# Patient Record
Sex: Male | Born: 1935 | Race: White | Hispanic: No | Marital: Married | Smoking: Never smoker
Health system: Southern US, Community
[De-identification: ages and names within clinical notes are randomized; demographics above are authoritative.]

## PROBLEM LIST (undated history)

## (undated) DIAGNOSIS — I255 Ischemic cardiomyopathy: Secondary | ICD-10-CM

## (undated) DIAGNOSIS — N289 Disorder of kidney and ureter, unspecified: Secondary | ICD-10-CM

## (undated) DIAGNOSIS — I4891 Unspecified atrial fibrillation: Secondary | ICD-10-CM

## (undated) DIAGNOSIS — Z888 Allergy status to other drugs, medicaments and biological substances status: Secondary | ICD-10-CM

## (undated) DIAGNOSIS — I509 Heart failure, unspecified: Secondary | ICD-10-CM

## (undated) DIAGNOSIS — E785 Hyperlipidemia, unspecified: Secondary | ICD-10-CM

## (undated) DIAGNOSIS — I779 Disorder of arteries and arterioles, unspecified: Secondary | ICD-10-CM

## (undated) DIAGNOSIS — N189 Chronic kidney disease, unspecified: Secondary | ICD-10-CM

## (undated) DIAGNOSIS — I10 Essential (primary) hypertension: Secondary | ICD-10-CM

## (undated) DIAGNOSIS — Z95810 Presence of automatic (implantable) cardiac defibrillator: Secondary | ICD-10-CM

## (undated) DIAGNOSIS — I739 Peripheral vascular disease, unspecified: Secondary | ICD-10-CM

## (undated) DIAGNOSIS — I340 Nonrheumatic mitral (valve) insufficiency: Secondary | ICD-10-CM

## (undated) DIAGNOSIS — I219 Acute myocardial infarction, unspecified: Secondary | ICD-10-CM

## (undated) DIAGNOSIS — M1990 Unspecified osteoarthritis, unspecified site: Secondary | ICD-10-CM

## (undated) DIAGNOSIS — I2510 Atherosclerotic heart disease of native coronary artery without angina pectoris: Secondary | ICD-10-CM

## (undated) DIAGNOSIS — K219 Gastro-esophageal reflux disease without esophagitis: Secondary | ICD-10-CM

## (undated) HISTORY — DX: Disorder of kidney and ureter, unspecified: N28.9

## (undated) HISTORY — PX: KNEE ARTHROSCOPY: SHX127

## (undated) HISTORY — DX: Unspecified atrial fibrillation: I48.91

## (undated) HISTORY — DX: Hyperlipidemia, unspecified: E78.5

## (undated) HISTORY — PX: NASAL SINUS SURGERY: SHX719

## (undated) HISTORY — DX: Allergy status to other drugs, medicaments and biological substances: Z88.8

## (undated) HISTORY — DX: Chronic kidney disease, unspecified: N18.9

## (undated) HISTORY — DX: Ischemic cardiomyopathy: I25.5

## (undated) HISTORY — DX: Disorder of arteries and arterioles, unspecified: I77.9

## (undated) HISTORY — DX: Peripheral vascular disease, unspecified: I73.9

---

## 2006-07-31 ENCOUNTER — Encounter: Admission: RE | Admit: 2006-07-31 | Discharge: 2006-08-07 | Payer: Self-pay | Admitting: Orthopaedic Surgery

## 2011-10-08 DIAGNOSIS — I5023 Acute on chronic systolic (congestive) heart failure: Secondary | ICD-10-CM

## 2011-10-08 DIAGNOSIS — I200 Unstable angina: Secondary | ICD-10-CM

## 2011-10-09 DIAGNOSIS — I509 Heart failure, unspecified: Secondary | ICD-10-CM

## 2011-10-10 ENCOUNTER — Encounter (HOSPITAL_COMMUNITY): Payer: Self-pay | Admitting: General Practice

## 2011-10-10 ENCOUNTER — Inpatient Hospital Stay (HOSPITAL_COMMUNITY)
Admission: AD | Admit: 2011-10-10 | Discharge: 2011-10-24 | DRG: 216 | Disposition: A | Discharge status: Home / Self Care | Payer: Medicare Other | Source: Other Acute Inpatient Hospital | Attending: Thoracic Surgery (Cardiothoracic Vascular Surgery) | Admitting: Thoracic Surgery (Cardiothoracic Vascular Surgery)

## 2011-10-10 DIAGNOSIS — D62 Acute posthemorrhagic anemia: Secondary | ICD-10-CM | POA: Diagnosis not present

## 2011-10-10 DIAGNOSIS — Z7901 Long term (current) use of anticoagulants: Secondary | ICD-10-CM

## 2011-10-10 DIAGNOSIS — Z7982 Long term (current) use of aspirin: Secondary | ICD-10-CM

## 2011-10-10 DIAGNOSIS — I214 Non-ST elevation (NSTEMI) myocardial infarction: Principal | ICD-10-CM | POA: Diagnosis present

## 2011-10-10 DIAGNOSIS — I509 Heart failure, unspecified: Secondary | ICD-10-CM | POA: Diagnosis present

## 2011-10-10 DIAGNOSIS — I059 Rheumatic mitral valve disease, unspecified: Secondary | ICD-10-CM | POA: Diagnosis present

## 2011-10-10 DIAGNOSIS — I4891 Unspecified atrial fibrillation: Secondary | ICD-10-CM | POA: Diagnosis not present

## 2011-10-10 DIAGNOSIS — I519 Heart disease, unspecified: Secondary | ICD-10-CM | POA: Diagnosis not present

## 2011-10-10 DIAGNOSIS — N179 Acute kidney failure, unspecified: Secondary | ICD-10-CM | POA: Diagnosis not present

## 2011-10-10 DIAGNOSIS — Z79899 Other long term (current) drug therapy: Secondary | ICD-10-CM

## 2011-10-10 DIAGNOSIS — I5021 Acute systolic (congestive) heart failure: Secondary | ICD-10-CM

## 2011-10-10 DIAGNOSIS — K219 Gastro-esophageal reflux disease without esophagitis: Secondary | ICD-10-CM | POA: Diagnosis present

## 2011-10-10 DIAGNOSIS — I6529 Occlusion and stenosis of unspecified carotid artery: Secondary | ICD-10-CM | POA: Diagnosis present

## 2011-10-10 DIAGNOSIS — Z9889 Other specified postprocedural states: Secondary | ICD-10-CM

## 2011-10-10 DIAGNOSIS — Y832 Surgical operation with anastomosis, bypass or graft as the cause of abnormal reaction of the patient, or of later complication, without mention of misadventure at the time of the procedure: Secondary | ICD-10-CM | POA: Diagnosis not present

## 2011-10-10 DIAGNOSIS — I340 Nonrheumatic mitral (valve) insufficiency: Secondary | ICD-10-CM | POA: Diagnosis present

## 2011-10-10 DIAGNOSIS — Y9210 Unspecified residential institution as the place of occurrence of the external cause: Secondary | ICD-10-CM | POA: Diagnosis not present

## 2011-10-10 DIAGNOSIS — N183 Chronic kidney disease, stage 3 unspecified: Secondary | ICD-10-CM | POA: Diagnosis present

## 2011-10-10 DIAGNOSIS — I2510 Atherosclerotic heart disease of native coronary artery without angina pectoris: Secondary | ICD-10-CM | POA: Diagnosis present

## 2011-10-10 DIAGNOSIS — E119 Type 2 diabetes mellitus without complications: Secondary | ICD-10-CM | POA: Diagnosis present

## 2011-10-10 DIAGNOSIS — I129 Hypertensive chronic kidney disease with stage 1 through stage 4 chronic kidney disease, or unspecified chronic kidney disease: Secondary | ICD-10-CM | POA: Diagnosis present

## 2011-10-10 DIAGNOSIS — Z951 Presence of aortocoronary bypass graft: Secondary | ICD-10-CM

## 2011-10-10 DIAGNOSIS — I2589 Other forms of chronic ischemic heart disease: Secondary | ICD-10-CM | POA: Diagnosis present

## 2011-10-10 DIAGNOSIS — E7800 Pure hypercholesterolemia, unspecified: Secondary | ICD-10-CM | POA: Diagnosis present

## 2011-10-10 DIAGNOSIS — E785 Hyperlipidemia, unspecified: Secondary | ICD-10-CM | POA: Diagnosis present

## 2011-10-10 DIAGNOSIS — D696 Thrombocytopenia, unspecified: Secondary | ICD-10-CM | POA: Diagnosis not present

## 2011-10-10 DIAGNOSIS — I5023 Acute on chronic systolic (congestive) heart failure: Secondary | ICD-10-CM | POA: Diagnosis present

## 2011-10-10 HISTORY — DX: Essential (primary) hypertension: I10

## 2011-10-10 HISTORY — DX: Nonrheumatic mitral (valve) insufficiency: I34.0

## 2011-10-10 HISTORY — DX: Unspecified osteoarthritis, unspecified site: M19.90

## 2011-10-10 HISTORY — DX: Gastro-esophageal reflux disease without esophagitis: K21.9

## 2011-10-10 LAB — CBC
HCT: 44 % (ref 39.0–52.0)
Hemoglobin: 15.5 g/dL (ref 13.0–17.0)
MCH: 31.1 pg (ref 26.0–34.0)
MCHC: 35.2 g/dL (ref 30.0–36.0)
MCV: 88.2 fL (ref 78.0–100.0)

## 2011-10-10 MED ORDER — SODIUM CHLORIDE 0.9 % IV SOLN: 250.0000 mL | INTRAVENOUS | Status: DC | PRN

## 2011-10-10 MED ORDER — SODIUM CHLORIDE 0.9 % IJ SOLN: 3.0000 mL | INTRAMUSCULAR | Status: DC | PRN

## 2011-10-10 MED ORDER — HEPARIN SOD (PORCINE) IN D5W 100 UNIT/ML IV SOLN
1850.0000 [IU]/h | INTRAVENOUS | Status: DC
  Administered 2011-10-10: 1450 [IU]/h via INTRAVENOUS
  Administered 2011-10-11 (×3): 1850 [IU]/h via INTRAVENOUS
  Filled 2011-10-10 (×5): qty 250

## 2011-10-10 MED ORDER — ROSUVASTATIN CALCIUM 40 MG PO TABS
40.0000 mg | ORAL_TABLET | Freq: Every day | ORAL | Status: DC
  Administered 2011-10-10 – 2011-10-16 (×7): 40 mg via ORAL
  Filled 2011-10-10 (×9): qty 1

## 2011-10-10 MED ORDER — ONDANSETRON HCL 4 MG/2ML IJ SOLN: 4.0000 mg | Freq: Four times a day (QID) | INTRAMUSCULAR | Status: DC | PRN

## 2011-10-10 MED ORDER — INSULIN GLARGINE 100 UNIT/ML ~~LOC~~ SOLN
15.0000 [IU] | Freq: Every day | SUBCUTANEOUS | Status: DC
Start: 2011-10-10 — End: 2011-10-17
  Administered 2011-10-10 – 2011-10-16 (×7): 15 [IU] via SUBCUTANEOUS
  Filled 2011-10-10 (×2): qty 3

## 2011-10-10 MED ORDER — ASPIRIN EC 81 MG PO TBEC
81.0000 mg | DELAYED_RELEASE_TABLET | Freq: Every day | ORAL | Status: DC
  Administered 2011-10-11: 81 mg via ORAL
  Filled 2011-10-10: qty 1

## 2011-10-10 MED ORDER — CARVEDILOL 3.125 MG PO TABS
3.1250 mg | ORAL_TABLET | Freq: Two times a day (BID) | ORAL | Status: DC
  Administered 2011-10-10 – 2011-10-17 (×14): 3.125 mg via ORAL
  Filled 2011-10-10 (×17): qty 1

## 2011-10-10 MED ORDER — HEPARIN BOLUS VIA INFUSION
4000.0000 [IU] | Freq: Once | INTRAVENOUS | Status: AC
  Administered 2011-10-10: 4000 [IU] via INTRAVENOUS
  Filled 2011-10-10: qty 4000

## 2011-10-10 MED ORDER — LOSARTAN POTASSIUM 50 MG PO TABS
100.0000 mg | ORAL_TABLET | Freq: Every day | ORAL | Status: DC
  Administered 2011-10-11 – 2011-10-15 (×5): 100 mg via ORAL
  Filled 2011-10-10 (×6): qty 2

## 2011-10-10 MED ORDER — SODIUM CHLORIDE 0.9 % IJ SOLN
3.0000 mL | Freq: Two times a day (BID) | INTRAMUSCULAR | Status: DC
  Administered 2011-10-11 – 2011-10-16 (×9): 3 mL via INTRAVENOUS

## 2011-10-10 MED ORDER — ACETAMINOPHEN 325 MG PO TABS: 650.0000 mg | ORAL_TABLET | ORAL | Status: DC | PRN

## 2011-10-10 NOTE — Progress Notes (Signed)
Wayne Gordon is a 78 y.o. with hypertension, diabetes, and dyslipidemia admitted for progressive dyspnea, orthopnea and PND over the last week to [facility].  He was found to have newly diagnosed systolic heart failure, EF 25-30% with global hypokinesis of the LV with minor regional variations, moderately to severely dilated LA and mildly reduced RV systolic function.  Moderate to severe mitral regurgitation.  The patient has responded well to IV diuresis with symptomatic improvement.  He has been transferred for evaluation with a R and L heart cath on Wednesday by Dr. Kirke Corin.    Problems: 1. Acute systolic heart failure, EF 25-30% 2. NSTEMI, Type II     - peak troponin 0.07 with nl MBs 3. RI 4. DM 5. ACE-I allergy 6. HTN 7. HL  Plan: Continue current medical management until further evaluation with cardiac cath.  All questions concerning cath were answered for the patient and his wife.  Check Bmet.     Robbi Garter, PA-C 7:20 PM

## 2011-10-10 NOTE — Progress Notes (Signed)
1810 transferred from  [facility] via [facility]. No apparent distress

## 2011-10-10 NOTE — Progress Notes (Signed)
ANTICOAGULATION CONSULT NOTE - Initial Consult  Pharmacy Consult for Heparin Indication: chest pain/ACS  Allergies  Allergen Reactions  . Penicillins Palpitations    Patient Measurements: Height: 6\' 5"  (195.6 cm) Weight: 232 lb 5.8 oz (105.4 kg) ( scale A) IBW/kg (Calculated) : 89.1  Heparin Dosing Weight: 105kg  Vital Signs: Temp: 98 F (36.7 C) (02/11 1758) Temp src: Oral (02/11 1758) BP: 140/77 mmHg (02/11 1758) Pulse Rate: 93  (02/11 1758)  Labs: No results found for this basename: HGB:2,HCT:3,PLT:3,APTT:3,LABPROT:3,INR:3,HEPARINUNFRC:3,CREATININE:3,CKTOTAL:3,CKMB:3,TROPONINI:3 in the last 72 hours CrCl is unknown because no creatinine reading has been taken.  Medical History: No past medical history on file.  Medications:  Prescriptions prior to admission  Medication Sig Dispense Refill  . aspirin 81 MG chewable tablet Chew 81 mg by mouth daily.      [name] Kitchen CALCIUM-MAGNESIUM-ZINC PO Take 2 tablets by mouth daily.      . hydrochlorothiazide (HYDRODIURIL) 25 MG tablet Take 25 mg by mouth daily.      [name] Kitchen losartan (COZAAR) 100 MG tablet Take 100 mg by mouth daily.      . metFORMIN (GLUCOPHAGE) 850 MG tablet Take 850 mg by mouth 2 (two) times daily with a meal.      . Multiple Vitamins-Minerals (MULTIVITAMINS THER. W/MINERALS) TABS Take 1 tablet by mouth daily.      . niacin (NIASPAN) 1000 MG CR tablet Take 1,000 mg by mouth at bedtime.      [name] Kitchen omega-3 acid ethyl esters (LOVAZA) 1 G capsule Take 2 g by mouth daily.      [name] Kitchen omeprazole (PRILOSEC) 20 MG capsule Take 20 mg by mouth daily.      . rosuvastatin (CRESTOR) 20 MG tablet Take 20 mg by mouth every evening.      . testosterone cypionate (DEPOTESTOTERONE CYPIONATE) 200 MG/ML injection Inject 200 mg into the muscle every 14 (fourteen) days.      . vitamin E (VITAMIN E) 400 UNIT capsule Take 400 Units by mouth daily.       Scheduled:    . aspirin EC  81 mg Oral Daily  . carvedilol  3.125 mg Oral BID WC  . heparin  4,000  Units Intravenous Once  . insulin glargine  15 Units Subcutaneous QHS  . losartan  100 mg Oral Daily  . rosuvastatin  40 mg Oral q1800  . sodium chloride  3 mL Intravenous Q12H    Assessment: 77 YOM pending cardiac cath for Wednesday,  with NSTEMI per PA's note. Pharmacy is consulted to start heparin infusion for ACS. Patient was not on anticoagulants prior to admission. No baseline cbc, PT/INR or aPTT   Goal of Therapy:  Heparin level 0.3-0.7 units/ml   Plan:  1. Start Heparin Bolus 4000 units 2. Heparin infusion 1450 units/hr 3. Baseline PT/INR, CBC and aPTT 4. Daily CBC and heparin level with am Labs 5. F/u heparin level in Am    Alvester Morin, Albertha Ghee 10/10/2011,8:01 PM

## 2011-10-11 DIAGNOSIS — I214 Non-ST elevation (NSTEMI) myocardial infarction: Principal | ICD-10-CM

## 2011-10-11 LAB — BASIC METABOLIC PANEL
BUN: 26 mg/dL — ABNORMAL HIGH (ref 6–23)
Creatinine, Ser: 1.56 mg/dL — ABNORMAL HIGH (ref 0.50–1.35)
GFR calc Af Amer: 48 mL/min — ABNORMAL LOW (ref >90)
GFR calc non Af Amer: 42 mL/min — ABNORMAL LOW (ref >90)
Potassium: 4.2 mEq/L (ref 3.5–5.1)

## 2011-10-11 LAB — CBC
MCH: 30.5 pg (ref 26.0–34.0)
MCHC: 34.3 g/dL (ref 30.0–36.0)
MCV: 88.8 fL (ref 78.0–100.0)
Platelets: 232 10*3/uL (ref 150–400)
RDW: 12.8 % (ref 11.5–15.5)

## 2011-10-11 LAB — GLUCOSE, CAPILLARY
Glucose-Capillary: 134 mg/dL — ABNORMAL HIGH (ref 70–99)
Glucose-Capillary: 125 mg/dL — ABNORMAL HIGH (ref 70–99)
Glucose-Capillary: 101 mg/dL — ABNORMAL HIGH (ref 70–99)

## 2011-10-11 MED ORDER — HEPARIN BOLUS VIA INFUSION
3000.0000 [IU] | Freq: Once | INTRAVENOUS | Status: AC
  Administered 2011-10-11: 3000 [IU] via INTRAVENOUS
  Filled 2011-10-11: qty 3000

## 2011-10-11 MED ORDER — SODIUM CHLORIDE 0.9 % IJ SOLN: 3.0000 mL | INTRAMUSCULAR | Status: DC | PRN

## 2011-10-11 MED ORDER — SODIUM CHLORIDE 0.9 % IJ SOLN
3.0000 mL | Freq: Two times a day (BID) | INTRAMUSCULAR | Status: DC
  Administered 2011-10-11: 3 mL via INTRAVENOUS

## 2011-10-11 MED ORDER — ASPIRIN 81 MG PO CHEW
324.0000 mg | CHEWABLE_TABLET | ORAL | Status: AC
  Administered 2011-10-12: 324 mg via ORAL
  Filled 2011-10-11: qty 4

## 2011-10-11 MED ORDER — DIAZEPAM 5 MG PO TABS
5.0000 mg | ORAL_TABLET | ORAL | Status: AC
  Administered 2011-10-12: 5 mg via ORAL
  Filled 2011-10-11: qty 1

## 2011-10-11 MED ORDER — SODIUM CHLORIDE 0.9 % IV SOLN
1.0000 mL/kg/h | INTRAVENOUS | Status: DC
  Administered 2011-10-12: 1 mL/kg/h via INTRAVENOUS

## 2011-10-11 MED ORDER — SODIUM CHLORIDE 0.9 % IV SOLN: 250.0000 mL | INTRAVENOUS | Status: DC | PRN

## 2011-10-11 MED ORDER — ASPIRIN EC 81 MG PO TBEC
81.0000 mg | DELAYED_RELEASE_TABLET | Freq: Every day | ORAL | Status: DC
  Administered 2011-10-13 – 2011-10-16 (×4): 81 mg via ORAL
  Filled 2011-10-11 (×5): qty 1

## 2011-10-11 MED FILL — Heparin Sodium (Porcine) 100 Unt/ML in Sodium Chloride 0.45%: INTRAMUSCULAR | Qty: 250 | Status: AC

## 2011-10-11 NOTE — Progress Notes (Signed)
10/11/11 1430 UR Completed. Tera Mater, RN, BSN

## 2011-10-11 NOTE — Progress Notes (Signed)
ANTICOAGULATION CONSULT NOTE - Follow Up Consult  Pharmacy Consult for Heparin Indication: ACS  Allergies  Allergen Reactions  . Penicillins Palpitations    Patient Measurements: Height: 6\' 5"  (195.6 cm) Weight: 231 lb 14.8 oz (105.2 kg) IBW/kg (Calculated) : 89.1  Heparin Dosing Weight:  Vital Signs: Temp: 98.8 F (37.1 C) (02/12 1417) Temp src: Oral (02/12 1417) BP: 120/72 mmHg (02/12 1417) Pulse Rate: 81  (02/12 1417)  Labs:  Basename 10/11/11 1948 10/11/11 1354 10/11/11 0608 10/10/11 2056  HGB -- -- 15.0 15.5  HCT -- -- 43.7 44.0  PLT -- -- 232 251  APTT -- -- -- 51*  LABPROT -- -- -- 14.2  INR -- -- -- 1.08  HEPARINUNFRC 0.53 0.65 0.14* --  CREATININE -- -- 1.56* --  CKTOTAL -- -- -- --  CKMB -- -- -- --  TROPONINI -- -- -- --   Estimated Creatinine Clearance: 51.6 ml/min (by C-G formula based on Cr of 1.56).  Assessment: Patient is a 78 y.o M on heparin for ACS with plan for cath on 2/13.  2/12 at ~8pm RN notified Rx of some bleeding around IV site. I instructed RN to hold heparin as heparin level was in process.  Charge RN discovered IV site was bad, IV team came and placed new heplock.  Heparin level returned therapeutc = 0.53. Heparin restarted by RN and told to monitor for bleeding.   Goal of Therapy:  Heparin level 0.3-0.7 units/ml   Plan:  1) Continue with current heparin drip at 1850 units/hr as levels therapeutic x 2.  2) f/u aml and monitor for bleeding 3) Instructed RN to call MD if further bleeding at new IV site as may need to stop heparin   Dannielle Huh 10/11/2011,8:52 PM

## 2011-10-11 NOTE — Progress Notes (Signed)
1515 Wayne Gordon called . Made aware of pt's pauses in cardiac strip . Will put orders in

## 2011-10-11 NOTE — Significant Event (Signed)
Upon arrival to patient's room to obtain shift report with the dayshift nurse [facility] Tamsen Snider , noticed tech, Harvin Hazel NT 1 +3 was assisting patient's with changing the dressing on his new IV site (Right  AC changed today right before 1900 per patient's report) because it was bleeding.Patient is on heparin and nurse informed heparin was increased to 18.7ml/hr, 1850 units/hr, per call from pharmacy dept. After assisting tech to change dressing on right AC, there was still some moderate amount of bleeding that was sipping through dressing site. I called pharmacy dept., spoke to the pharmacist, Synetta Fail and advised him of info. He stated he would inform patient's pharmacist and have him come to patient's room to assess the site. Pharmacist, Juliette Alcide came and he informed me to temporarily hold heparin infusion until IV was assessed/changed and heparin evening level comes back. Lab was in the room around this time. Called IV team to come and assess site. While trying to fix dressing, IV extension came off. Charge nurse then removed patient's IV. IV team staff, Kara Dies came and placed a new IV in (Right posterior forearm). When Zack assessed the second site (IV placed at [facility] on Saturday 10/08/2011 per patient and wife), he concluded that that IV did not need to be changed and went forth to clean the site and changed the dressing. Heparin was turned off for approximately 30 mins. (approximately 8:10pm - 8:40pm). Dustin left for a [employer] on 2500. When he returned, heparin level result was already on the computer. Heparin level was within therapeutic range of 0.53. Heparin was restarted. Amalia Hailey stated to continue to monitor patient and if patient begins to bleed again, then to call provider on-call.

## 2011-10-11 NOTE — Progress Notes (Signed)
15000 pt had 2.9 second pause in cardiac strip . Pt asymptomatic . Placed a call to Dr. Charlestine Massed or coverage . Awaiting  for [employer] .to call back

## 2011-10-11 NOTE — Progress Notes (Signed)
CSW met with patient at the bedside to provide emotional support and CHF management education. CSW also assessed for depressive symptoms and the need for home health. Pt was acknowledged that he was newly diagnosed with CHF and that he was un-aware of all CHF management guidelines. CSW reviewed the CHF packet indepth explaining the HF symptoms, the zone tool and the importance of weighing daily, eating a low sodium diet and adhering to all medications prescribed.Pt currently lives with his wife and is very supported by his wife and his three children. Pt reported having no interest in [facility] at this time.   Pt reported no symptoms of depression at this time. Patient was very appreciative of support and education provided by CSW.Pt was interested in [facility] thus Clinical Social Worker contacted [facility] to see if he might be a possible candidate. Clinical Social Worker will sign off for now as social work intervention is no longer needed. Please consult [place] again if new need arises.

## 2011-10-11 NOTE — Progress Notes (Signed)
   Subjective:  Patient was transferred here from [facility] for systolic CHF and mitral regurgitation.  He is scheduled for right and left heart cath tomorrow with Dr. Kirke Corin.  Has history of  Diabetes and mild renal insufficiency.  Recent Type II NSTEMI.  Objective:  Vital Signs in the last 24 hours: Temp:  [97.8 F (36.6 C)-98 F (36.7 C)] 97.8 F (36.6 C) (02/12 0618) Pulse Rate:  [78-93] 78  (02/12 0618) Resp:  [19-20] 19  (02/12 0618) BP: (114-140)/(65-77) 115/65 mmHg (02/12 0618) SpO2:  [94 %-96 %] 96 % (02/12 0618) Weight:  [231 lb 14.8 oz (105.2 kg)-232 lb 5.8 oz (105.4 kg)] 231 lb 14.8 oz (105.2 kg) (02/12 0618)  Intake/Output from previous day: 02/11 0701 - 02/12 0700 In: 240 [P.O.:240] Out: 800 [Urine:800] Intake/Output from this shift:       . aspirin EC  81 mg Oral Daily  . carvedilol  3.125 mg Oral BID WC  . heparin  3,000 Units Intravenous Once  . heparin  4,000 Units Intravenous Once  . insulin glargine  15 Units Subcutaneous QHS  . losartan  100 mg Oral Daily  . rosuvastatin  40 mg Oral q1800  . sodium chloride  3 mL Intravenous Q12H      . heparin 1,850 Units/hr (10/11/11 1914)    Physical Exam: The patient appears to be in no distress.  Head and neck exam reveals that the pupils are equal and reactive.  The extraocular movements are full.  There is no scleral icterus.  Mouth and pharynx are benign.  No lymphadenopathy.  No carotid bruits.  The jugular venous pressure is normal.  Thyroid is not enlarged or tender.  Chest is clear to percussion and auscultation.  No rales or rhonchi.  Expansion of the chest is symmetrical.  Heart reveals no abnormal lift or heave.  First and second heart sounds are normal.  There is grade 2/6 apical murmur of MR.  The abdomen is soft and nontender.  Bowel sounds are normoactive.  There is no hepatosplenomegaly or mass.  There are no abdominal bruits.  Extremities reveal no phlebitis or edema.  Pedal pulses are good.  There  is no cyanosis or clubbing.  Neurologic exam is normal strength and no lateralizing weakness.  No sensory deficits.  Integument reveals no rash  Lab Results:  Basename 10/11/11 0608 10/10/11 2056  WBC 6.0 6.3  HGB 15.0 15.5  PLT 232 251    Basename 10/11/11 0608  NA 138  K 4.2  CL 96  CO2 31  GLUCOSE 114*  BUN 26*  CREATININE 1.56*   No results found for this basename: TROPONINI:2,CK,MB:2 in the last 72 hours Hepatic Function Panel No results found for this basename: PROT,ALBUMIN,AST,ALT,ALKPHOS,BILITOT,BILIDIR,IBILI in the last 72 hours No results found for this basename: CHOL in the last 72 hours No results found for this basename: PROTIME in the last 72 hours   Imaging: Imaging results have been reviewed  Cardiac Studies: Cath pending tomorrow. Assessment/Plan:  Patient Active Hospital Problem List: 1. Acute on chronic systolic CHF 2. Recent NSTEMI 3. Diabetes mellitus 4. Renal insufficiency 5. HTN 6. Mitral regurgitation  Plan:  Continue IV heparin, same meds. Pre-cath orders placed in EPIC   LOS: 1 day    Cassell Clement 10/11/2011, 7:51 AM

## 2011-10-11 NOTE — Progress Notes (Signed)
ANTICOAGULATION CONSULT NOTE   Pharmacy Consult for Heparin Indication: chest pain/ACS  Allergies  Allergen Reactions  . Penicillins Palpitations    Patient Measurements: Height: 6\' 5"  (195.6 cm) Weight: 231 lb 14.8 oz (105.2 kg) IBW/kg (Calculated) : 89.1  Heparin Dosing Weight: 105kg  Vital Signs: Temp: 97.8 F (36.6 C) (02/12 0618) Temp src: Oral (02/12 0618) BP: 115/65 mmHg (02/12 0618) Pulse Rate: 78  (02/12 0618)  Labs:  Basename 10/11/11 0608 10/10/11 2056  HGB 15.0 15.5  HCT 43.7 44.0  PLT 232 251  APTT -- 51*  LABPROT -- 14.2  INR -- 1.08  HEPARINUNFRC 0.14* --  CREATININE 1.56* --  CKTOTAL -- --  CKMB -- --  TROPONINI -- --   Estimated Creatinine Clearance: 51.6 ml/min (by C-G formula based on Cr of 1.56).  Assessment: 78 YO Male with CHF/NSTEMI, awaiting cath tomorrow, for Heparin  Goal of Therapy:  Heparin level 0.3-0.7 units/ml   Plan:  1. Heparin Bolus 3000 units 2. Increase Heparin infusion 1850 units/hr 3. Check heparin level in 6 hours.   Eddie Candle 10/11/2011,7:19 AM

## 2011-10-11 NOTE — Progress Notes (Signed)
ANTICOAGULATION CONSULT NOTE - Follow Up Consult  Pharmacy Consult for Heparin Indication: ACS  Allergies  Allergen Reactions  . Penicillins Palpitations    Patient Measurements: Height: 6\' 5"  (195.6 cm) Weight: 231 lb 14.8 oz (105.2 kg) IBW/kg (Calculated) : 89.1  Heparin Dosing Weight:  Vital Signs: Temp: 98.8 F (37.1 C) (02/12 1417) Temp src: Oral (02/12 1417) BP: 120/72 mmHg (02/12 1417) Pulse Rate: 81  (02/12 1417)  Labs:  Basename 10/11/11 1354 10/11/11 0608 10/10/11 2056  HGB -- 15.0 15.5  HCT -- 43.7 44.0  PLT -- 232 251  APTT -- -- 51*  LABPROT -- -- 14.2  INR -- -- 1.08  HEPARINUNFRC 0.65 0.14* --  CREATININE -- 1.56* --  CKTOTAL -- -- --  CKMB -- -- --  TROPONINI -- -- --   Estimated Creatinine Clearance: 51.6 ml/min (by C-G formula based on Cr of 1.56).  Assessment: Patient is a 78 y.o M on heparin for ACS with plan for cath on 2/13.  Heparin level now therapeutic after rate increased this AM.    Goal of Therapy:  Heparin level 0.3-0.7 units/ml   Plan:  1) Will continue with current heparin drip at 1850 units/hr 2) Recheck another 6 hr heparin level to make sure level is still therapeutic before changing to daily level  Wayne Gordon P 10/11/2011,3:12 PM

## 2011-10-12 ENCOUNTER — Encounter (HOSPITAL_COMMUNITY)
Admission: AD | Disposition: A | Discharge status: Home / Self Care | Payer: Self-pay | Source: Other Acute Inpatient Hospital | Attending: Thoracic Surgery (Cardiothoracic Vascular Surgery)

## 2011-10-12 ENCOUNTER — Other Ambulatory Visit: Payer: Self-pay

## 2011-10-12 ENCOUNTER — Ambulatory Visit (HOSPITAL_COMMUNITY): Admit: 2011-10-12 | Payer: Medicare Other | Admitting: Cardiovascular Disease

## 2011-10-12 ENCOUNTER — Encounter (HOSPITAL_COMMUNITY): Payer: Self-pay | Admitting: Thoracic Surgery (Cardiothoracic Vascular Surgery)

## 2011-10-12 DIAGNOSIS — I2510 Atherosclerotic heart disease of native coronary artery without angina pectoris: Secondary | ICD-10-CM

## 2011-10-12 DIAGNOSIS — I5021 Acute systolic (congestive) heart failure: Secondary | ICD-10-CM | POA: Diagnosis present

## 2011-10-12 DIAGNOSIS — I359 Nonrheumatic aortic valve disorder, unspecified: Secondary | ICD-10-CM

## 2011-10-12 DIAGNOSIS — I059 Rheumatic mitral valve disease, unspecified: Secondary | ICD-10-CM

## 2011-10-12 DIAGNOSIS — Z01810 Encounter for preprocedural cardiovascular examination: Secondary | ICD-10-CM

## 2011-10-12 DIAGNOSIS — I340 Nonrheumatic mitral (valve) insufficiency: Secondary | ICD-10-CM | POA: Diagnosis present

## 2011-10-12 HISTORY — PX: LEFT AND RIGHT HEART CATHETERIZATION WITH CORONARY ANGIOGRAM: SHX5449

## 2011-10-12 LAB — POCT I-STAT 3, VENOUS BLOOD GAS (G3P V)
O2 Saturation: 66 %
TCO2: 25 mmol/L (ref 0–100)

## 2011-10-12 LAB — BASIC METABOLIC PANEL
Chloride: 95 mEq/L — ABNORMAL LOW (ref 96–112)
GFR calc non Af Amer: 46 mL/min — ABNORMAL LOW (ref >90)
Glucose, Bld: 110 mg/dL — ABNORMAL HIGH (ref 70–99)
Potassium: 3.3 mEq/L — ABNORMAL LOW (ref 3.5–5.1)
Sodium: 132 mEq/L — ABNORMAL LOW (ref 135–145)

## 2011-10-12 LAB — GLUCOSE, CAPILLARY: Glucose-Capillary: 126 mg/dL — ABNORMAL HIGH (ref 70–99)

## 2011-10-12 LAB — CBC
HCT: 40.5 % (ref 39.0–52.0)
Hemoglobin: 14.1 g/dL (ref 13.0–17.0)
Hemoglobin: 14.6 g/dL (ref 13.0–17.0)
MCH: 30.3 pg (ref 26.0–34.0)
MCV: 86.9 fL (ref 78.0–100.0)
Platelets: 233 10*3/uL (ref 150–400)
RBC: 4.65 MIL/uL (ref 4.22–5.81)
RBC: 4.67 MIL/uL (ref 4.22–5.81)
WBC: 6.3 10*3/uL (ref 4.0–10.5)

## 2011-10-12 LAB — CREATININE, SERUM: GFR calc Af Amer: 61 mL/min — ABNORMAL LOW (ref >90)

## 2011-10-12 LAB — POCT I-STAT 3, ART BLOOD GAS (G3+)
O2 Saturation: 94 %
TCO2: 25 mmol/L (ref 0–100)
pCO2 arterial: 38.8 mmHg (ref 35.0–45.0)
pO2, Arterial: 72 mmHg — ABNORMAL LOW (ref 80.0–100.0)

## 2011-10-12 LAB — HEPARIN LEVEL (UNFRACTIONATED): Heparin Unfractionated: 0.61 IU/mL (ref 0.30–0.70)

## 2011-10-12 SURGERY — LEFT AND RIGHT HEART CATHETERIZATION WITH CORONARY ANGIOGRAM
Anesthesia: LOCAL

## 2011-10-12 MED ORDER — THERA M PLUS PO TABS: 1.0000 | ORAL_TABLET | Freq: Every day | ORAL | Status: DC

## 2011-10-12 MED ORDER — NITROGLYCERIN 0.2 MG/ML ON CALL CATH LAB
INTRAVENOUS | Status: AC
  Filled 2011-10-12: qty 1

## 2011-10-12 MED ORDER — POTASSIUM CHLORIDE CRYS ER 20 MEQ PO TBCR
40.0000 meq | EXTENDED_RELEASE_TABLET | Freq: Once | ORAL | Status: AC
  Administered 2011-10-12: 40 meq via ORAL
  Filled 2011-10-12: qty 2

## 2011-10-12 MED ORDER — FUROSEMIDE 40 MG PO TABS
40.0000 mg | ORAL_TABLET | Freq: Two times a day (BID) | ORAL | Status: DC
  Administered 2011-10-12 – 2011-10-14 (×5): 40 mg via ORAL
  Filled 2011-10-12 (×8): qty 1

## 2011-10-12 MED ORDER — ENOXAPARIN SODIUM 40 MG/0.4ML ~~LOC~~ SOLN
40.0000 mg | SUBCUTANEOUS | Status: DC
  Administered 2011-10-13 – 2011-10-16 (×4): 40 mg via SUBCUTANEOUS
  Filled 2011-10-12 (×6): qty 0.4

## 2011-10-12 MED ORDER — ADULT MULTIVITAMIN W/MINERALS CH
1.0000 | ORAL_TABLET | Freq: Every day | ORAL | Status: DC
  Administered 2011-10-12 – 2011-10-16 (×5): 1 via ORAL
  Filled 2011-10-12 (×6): qty 1

## 2011-10-12 MED ORDER — OMEGA-3-ACID ETHYL ESTERS 1 G PO CAPS
2.0000 g | ORAL_CAPSULE | Freq: Every day | ORAL | Status: DC
  Administered 2011-10-12 – 2011-10-16 (×5): 2 g via ORAL
  Filled 2011-10-12 (×7): qty 2

## 2011-10-12 MED ORDER — HEPARIN (PORCINE) IN NACL 2-0.9 UNIT/ML-% IJ SOLN
INTRAMUSCULAR | Status: AC
  Filled 2011-10-12: qty 2000

## 2011-10-12 MED ORDER — SODIUM CHLORIDE 0.45 % IV SOLN
INTRAVENOUS | Status: AC
  Administered 2011-10-12: 17:00:00 via INTRAVENOUS

## 2011-10-12 MED ORDER — FENTANYL CITRATE 0.05 MG/ML IJ SOLN
INTRAMUSCULAR | Status: AC
  Filled 2011-10-12: qty 2

## 2011-10-12 MED ORDER — PANTOPRAZOLE SODIUM 40 MG PO TBEC
40.0000 mg | DELAYED_RELEASE_TABLET | Freq: Every day | ORAL | Status: DC
  Administered 2011-10-12 – 2011-10-16 (×5): 40 mg via ORAL
  Filled 2011-10-12 (×6): qty 1

## 2011-10-12 MED ORDER — LIDOCAINE HCL (PF) 1 % IJ SOLN
INTRAMUSCULAR | Status: AC
  Filled 2011-10-12: qty 30

## 2011-10-12 MED ORDER — LOSARTAN POTASSIUM 50 MG PO TABS: 100.0000 mg | ORAL_TABLET | Freq: Every day | ORAL | Status: DC

## 2011-10-12 MED ORDER — MIDAZOLAM HCL 2 MG/2ML IJ SOLN
INTRAMUSCULAR | Status: AC
  Filled 2011-10-12: qty 2

## 2011-10-12 MED ORDER — VITAMIN E 180 MG (400 UNIT) PO CAPS
400.0000 [IU] | ORAL_CAPSULE | Freq: Every day | ORAL | Status: DC
  Administered 2011-10-12 – 2011-10-16 (×5): 400 [IU] via ORAL
  Filled 2011-10-12 (×8): qty 1

## 2011-10-12 MED ORDER — ASPIRIN 81 MG PO CHEW: 81.0000 mg | CHEWABLE_TABLET | Freq: Every day | ORAL | Status: DC

## 2011-10-12 MED ORDER — TESTOSTERONE CYPIONATE 200 MG/ML IM SOLN
200.0000 mg | INTRAMUSCULAR | Status: DC
  Administered 2011-10-12: 200 mg via INTRAMUSCULAR
  Filled 2011-10-12: qty 1

## 2011-10-12 NOTE — Interval H&P Note (Signed)
History and Physical Interval Note:  10/12/2011 12:14 PM  Wayne Gordon  has presented today for surgery, with the diagnosis of Chest pain  The various methods of treatment have been discussed with the patient and family. After consideration of risks, benefits and other options for treatment, the patient has consented to  Procedure(s) (LRB): LEFT AND RIGHT HEART CATHETERIZATION WITH CORONARY ANGIOGRAM (N/A) as a surgical intervention .  The patients' history has been reviewed, patient examined, no change in status, stable for surgery.  I have reviewed the patients' chart and labs.  Questions were answered to the patient's satisfaction.     Lorine Bears

## 2011-10-12 NOTE — Progress Notes (Signed)
Called and informed Dr. Charm Barges of bleeding to pt' Rt. Groin cath  site and measures taken to decrease bleeding.  Amanda Pea, RN

## 2011-10-12 NOTE — Progress Notes (Signed)
Pt had valium early am on previous shift for card. Cath.  Dr. Kirke Corin made aware and instructed it was ok.  No  New order given.  Pt sent to cath lab.  Amanda Pea, RN

## 2011-10-12 NOTE — Progress Notes (Signed)
Pt's Rt. Groin cath  site bleeding slightly increase.  Pt remain asymptomatic  D. Dawn PA  notified.  Instructed to call Dr. Charm Barges whose is on call at this time.  Amanda Pea, Charity fundraiser.

## 2011-10-12 NOTE — Progress Notes (Signed)
Subjective:  Patient was transferred here from [facility] for systolic CHF (25-23%) and moderate to severe mitral regurgitation.  He is scheduled for right and left heart cath today.  Has history of  Diabetes and mild renal insufficiency.  Recent Type II NSTEMI.  Objective:  Vital Signs in the last 24 hours: Temp:  [97.9 F (36.6 C)-98.8 F (37.1 C)] 97.9 F (36.6 C) (02/13 0518) Pulse Rate:  [75-81] 81  (02/13 0518) Resp:  [20] 20  (02/13 0518) BP: (113-120)/(71-78) 115/78 mmHg (02/13 0518) SpO2:  [97 %-98 %] 98 % (02/13 0518) Weight:  [228 lb 2.8 oz (103.5 kg)] 228 lb 2.8 oz (103.5 kg) (02/13 0518)  Intake/Output from previous day: 02/12 0701 - 02/13 0700 In: 720 [P.O.:720] Out: 1050 [Urine:1050] Intake/Output from this shift:       . aspirin  324 mg Oral Pre-Cath  . aspirin EC  81 mg Oral Daily  . carvedilol  3.125 mg Oral BID WC  . diazepam  5 mg Oral On Call  . insulin glargine  15 Units Subcutaneous QHS  . losartan  100 mg Oral Daily  . rosuvastatin  40 mg Oral q1800  . sodium chloride  3 mL Intravenous Q12H  . sodium chloride  3 mL Intravenous Q12H  . DISCONTD: aspirin EC  81 mg Oral Daily      . sodium chloride 1 mL/kg/hr (10/12/11 0445)  . DISCONTD: heparin 1,850 Units/hr (10/11/11 2305)    Physical Exam: The patient appears to be in no distress.  Head and neck exam reveals that the pupils are equal and reactive.  The extraocular movements are full.  There is no scleral icterus.  Mouth and pharynx are benign.  No lymphadenopathy.  No carotid bruits.  The jugular venous pressure is normal.  Thyroid is not enlarged or tender.  Chest is clear to percussion and auscultation.  No rales or rhonchi.  Expansion of the chest is symmetrical.  Heart reveals no abnormal lift or heave.  First and second heart sounds are normal.  There is grade 2/6 apical murmur of MR.  The abdomen is soft and nontender.  Bowel sounds are normoactive.  There is no hepatosplenomegaly or  mass.  There are no abdominal bruits.  Extremities reveal no phlebitis or edema.  Pedal pulses are good.  There is no cyanosis or clubbing.  Neurologic exam is normal strength and no lateralizing weakness.  No sensory deficits.  Integument reveals no rash  Lab Results:  Basename 10/12/11 0530 10/11/11 0608  WBC 5.9 6.0  HGB 14.1 15.0  PLT 229 232    Basename 10/12/11 0530 10/11/11 0608  NA 132* 138  K 3.3* 4.2  CL 95* 96  CO2 27 31  GLUCOSE 110* 114*  BUN 24* 26*  CREATININE 1.45* 1.56*   No results found for this basename: TROPONINI:2,CK,MB:2 in the last 72 hours Hepatic Function Panel No results found for this basename: PROT,ALBUMIN,AST,ALT,ALKPHOS,BILITOT,BILIDIR,IBILI in the last 72 hours No results found for this basename: CHOL in the last 72 hours No results found for this basename: PROTIME in the last 72 hours   Imaging: Imaging results have been reviewed  Cardiac Studies: Cath pending tomorrow. Assessment/Plan:  Patient Active Hospital Problem List: 1. Acute on chronic systolic CHF 2. Recent NSTEMI 3. Diabetes mellitus 4. Renal insufficiency 5. HTN 6. Mitral regurgitation  Plan:  stop IV heparin, continue same meds. If he has significant CAD, he will likely be referred for CABG + MV repair. If no  CAD, likely initial medical therapy and reevaluation in few months to see if ICD/CRT is needed.     LOS: 2 days    Lorine Bears 10/12/2011, 8:10 AM

## 2011-10-12 NOTE — Progress Notes (Signed)
Administered pre-cath valium 5mg  1x dose during scheduled time (4098). However, found out catherization procedure is not until 11am. Called cardiologist (Dr. Terrilee Files) who will be performing the procedure at the outpatient cath lab. Spoke to a male named Selena Batten, who stated the cardiologist is currently at a procedure and he will pass the info to the cardiologist. Will pass info to dayshift nurse, who will follow-up.

## 2011-10-12 NOTE — H&P (View-Only) (Signed)
 Subjective:  Patient was transferred here from [place] for systolic CHF (25-23%) and moderate to severe mitral regurgitation.  He is scheduled for right and left heart cath today.  Has history of  Diabetes and mild renal insufficiency.  Recent Type II NSTEMI.  Objective:  Vital Signs in the last 24 hours: Temp:  [97.9 F (36.6 C)-98.8 F (37.1 C)] 97.9 F (36.6 C) (02/13 0518) Pulse Rate:  [75-81] 81  (02/13 0518) Resp:  [20] 20  (02/13 0518) BP: (113-120)/(71-78) 115/78 mmHg (02/13 0518) SpO2:  [97 %-98 %] 98 % (02/13 0518) Weight:  [228 lb 2.8 oz (103.5 kg)] 228 lb 2.8 oz (103.5 kg) (02/13 0518)  Intake/Output from previous day: 02/12 0701 - 02/13 0700 In: 720 [P.O.:720] Out: 1050 [Urine:1050] Intake/Output from this shift:       . aspirin  324 mg Oral Pre-Cath  . aspirin EC  81 mg Oral Daily  . carvedilol  3.125 mg Oral BID WC  . diazepam  5 mg Oral On Call  . insulin glargine  15 Units Subcutaneous QHS  . losartan  100 mg Oral Daily  . rosuvastatin  40 mg Oral q1800  . sodium chloride  3 mL Intravenous Q12H  . sodium chloride  3 mL Intravenous Q12H  . DISCONTD: aspirin EC  81 mg Oral Daily      . sodium chloride 1 mL/kg/hr (10/12/11 0445)  . DISCONTD: heparin 1,850 Units/hr (10/11/11 2305)    Physical Exam: The patient appears to be in no distress.  Head and neck exam reveals that the pupils are equal and reactive.  The extraocular movements are full.  There is no scleral icterus.  Mouth and pharynx are benign.  No lymphadenopathy.  No carotid bruits.  The jugular venous pressure is normal.  Thyroid is not enlarged or tender.  Chest is clear to percussion and auscultation.  No rales or rhonchi.  Expansion of the chest is symmetrical.  Heart reveals no abnormal lift or heave.  First and second heart sounds are normal.  There is grade 2/6 apical murmur of MR.  The abdomen is soft and nontender.  Bowel sounds are normoactive.  There is no hepatosplenomegaly or  mass.  There are no abdominal bruits.  Extremities reveal no phlebitis or edema.  Pedal pulses are good.  There is no cyanosis or clubbing.  Neurologic exam is normal strength and no lateralizing weakness.  No sensory deficits.  Integument reveals no rash  Lab Results:  Basename 10/12/11 0530 10/11/11 0608  WBC 5.9 6.0  HGB 14.1 15.0  PLT 229 232    Basename 10/12/11 0530 10/11/11 0608  NA 132* 138  K 3.3* 4.2  CL 95* 96  CO2 27 31  GLUCOSE 110* 114*  BUN 24* 26*  CREATININE 1.45* 1.56*   No results found for this basename: TROPONINI:2,CK,MB:2 in the last 72 hours Hepatic Function Panel No results found for this basename: PROT,ALBUMIN,AST,ALT,ALKPHOS,BILITOT,BILIDIR,IBILI in the last 72 hours No results found for this basename: CHOL in the last 72 hours No results found for this basename: PROTIME in the last 72 hours   Imaging: Imaging results have been reviewed  Cardiac Studies: Cath pending tomorrow. Assessment/Plan:  Patient Active Hospital Problem List: 1. Acute on chronic systolic CHF 2. Recent NSTEMI 3. Diabetes mellitus 4. Renal insufficiency 5. HTN 6. Mitral regurgitation  Plan:  stop IV heparin, continue same meds. If he has significant CAD, he will likely be referred for CABG + MV repair. If no   CAD, likely initial medical therapy and reevaluation in few months to see if ICD/CRT is needed.     LOS: 2 days    Natalija Mavis 10/12/2011, 8:10 AM    

## 2011-10-12 NOTE — Progress Notes (Addendum)
Pressure applied by charge nurse and pt's primary nurse made aware of calls made and instructions given by D. Dawn, [place].  Verbalized understanding.  Pt  also instructed not to get up until further instructions given by MD wife remain at bedside & verbalized instructions given.  Amanda Pea, RN

## 2011-10-12 NOTE — Progress Notes (Signed)
Pt Rt. Groin post cath site with small amt. Of blood on Dsg.  Site mark to recognized further bleeding.  No bruise or hematoma noted.  Pt denies pain or discomfort  Bp121/76, p78.  D. Dawn informed.  Spouse at the bedside.  Will cont. To monitor.  D. Dawn PA informed.  Instructed to apply pressure to site and keep pt on bedrest.  Amanda Pea, RN

## 2011-10-12 NOTE — Progress Notes (Signed)
Cardiologist, Dr. Charm Barges, Wayne Gordon came in earlier this evening to assess patient, but patient talking to another doctor. Charm Barges came back to assess. Stated patient's bleeding was ok, no hematoma apparent, asymptomatic, pulses 2+ bilaterally, a and no intervention needed. Will continue to monitor patient.

## 2011-10-12 NOTE — Progress Notes (Signed)
Pre CABG Dopplers completed at 16:00.  Preliminary report is no evidence of significant ICA stenosis on the right and 60-79% stenosis on the left.  ABI is not ascertained due to calcified vessels bilaterally (the right anterior tibial and the left posterior tibial arteries are non compressible).  Triphasic waveforms noted throughout.

## 2011-10-12 NOTE — Consults (Signed)
CARDIOTHORACIC SURGERY CONSULTATION REPORT  PCP is [name],[name], DO, DO Referring Provider is ARIDA, Jerolyn Center A, MD   Reason for consultation:  Severe 3 vessel CAD and mitral regurgitation  HPI:  Patient is a 78 year old retired gentleman from [place] with no previous cardiac history but risk factors notable for type 2 diabetes mellitus hypertension and hyperlipidemia. The patient has remained physically active during recent years and reports no physical problems until the last 2 weeks. Approximately 2 weeks ago the patient developed relatively sudden onset of severe exertional shortness of breath that he noted primarily when he was walking. He was stop and rest in the reading would improve. Symptoms accelerated fairly rapidly to the point where be he began to experience orthopnea and shortness of breath with very mild physical activity. He never had any chest discomfort. He presented to [facility] in [place] where he was noted to be in congestive heart failure. Cardiac enzymes were abnormal and consistent with recent non-ST segment elevation myocardial infarction. An echocardiogram was performed demonstrating severe left ventricular dysfunction and moderate to severe mitral regurgitation. The patient was transferred to [facility] where he underwent left and right heart catheterization earlier today demonstrating severe three-vessel coronary artery disease with severe left ventricular dysfunction and moderate pulmonary hypertension. Cardiothoracic surgical consultation has been requested.  Past Medical History  Diagnosis Date  . Hypertension   . High cholesterol   . Heart murmur   . Shortness of breath     "lying down & w/exertion"  . Diabetes mellitus   . GERD (gastroesophageal reflux disease)   . Arthritis   . Congestive heart failure   . Mitral regurgitation     Past Surgical History  Procedure Date  . Knee arthroscopy ~ 2008    right  . Nasal sinus  surgery 1990's    right    History reviewed. No pertinent family history.  Social History History  Substance Use Topics  . Smoking status: Never Smoker   . Smokeless tobacco: Never Used  . Alcohol Use: No    Prior to Admission medications   Medication Sig Start Date End Date Taking? Authorizing Provider  aspirin 81 MG chewable tablet Chew 81 mg by mouth daily.   Yes Historical Provider, MD  CALCIUM-MAGNESIUM-ZINC PO Take 2 tablets by mouth daily.   Yes Historical Provider, MD  hydrochlorothiazide (HYDRODIURIL) 25 MG tablet Take 25 mg by mouth daily.   Yes Historical Provider, MD  losartan (COZAAR) 100 MG tablet Take 100 mg by mouth daily.   Yes Historical Provider, MD  metFORMIN (GLUCOPHAGE) 850 MG tablet Take 850 mg by mouth 2 (two) times daily with a meal.   Yes Historical Provider, MD  Multiple Vitamins-Minerals (MULTIVITAMINS THER. W/MINERALS) TABS Take 1 tablet by mouth daily.   Yes Historical Provider, MD  niacin (NIASPAN) 1000 MG CR tablet Take 1,000 mg by mouth at bedtime.   Yes Historical Provider, MD  omega-3 acid ethyl esters (LOVAZA) 1 G capsule Take 2 g by mouth daily.   Yes Historical Provider, MD  omeprazole (PRILOSEC) 20 MG capsule Take 20 mg by mouth daily.   Yes Historical Provider, MD  rosuvastatin (CRESTOR) 20 MG tablet Take 20 mg by mouth every evening.   Yes Historical Provider, MD  testosterone cypionate (DEPOTESTOTERONE CYPIONATE) 200 MG/ML injection Inject 200 mg into the muscle every 14 (fourteen) days.   Yes Historical Provider, MD  vitamin E (VITAMIN E) 400 UNIT capsule Take 400 Units  by mouth daily.   Yes Historical Provider, MD    Current Facility-Administered Medications  Medication Dose Route Frequency Provider Last Rate Last Dose  . 0.45 % sodium chloride infusion   Intravenous Continuous Iran Ouch, MD 75 mL/hr at 10/12/11 1658    . 0.9 %  sodium chloride infusion  250 mL Intravenous PRN Hadassah Pais, PA      . acetaminophen (TYLENOL)  tablet 650 mg  650 mg Oral Q4H PRN Hadassah Pais, PA      . aspirin chewable tablet 324 mg  324 mg Oral Pre-Cath Cassell Clement, MD   324 mg at 10/12/11 0615  . aspirin EC tablet 81 mg  81 mg Oral Daily Hilario Quarry Amend, [place]      . carvedilol (COREG) tablet 3.125 mg  3.125 mg Oral BID WC Hadassah Pais, PA   3.125 mg at 10/12/11 1548  . diazepam (VALIUM) tablet 5 mg  5 mg Oral On Call Cassell Clement, MD   5 mg at 10/12/11 0615  . enoxaparin (LOVENOX) injection 40 mg  40 mg Subcutaneous Q24H Iran Ouch, MD      . fentaNYL (SUBLIMAZE) 0.05 MG/ML injection           . furosemide (LASIX) tablet 40 mg  40 mg Oral BID Iran Ouch, MD   40 mg at 10/12/11 2105  . heparin 2-0.9 UNIT/ML-% infusion           . insulin glargine (LANTUS) injection 15 Units  15 Units Subcutaneous QHS Hadassah Pais, PA   15 Units at 10/11/11 2259  . lidocaine (XYLOCAINE) 1 % injection           . losartan (COZAAR) tablet 100 mg  100 mg Oral Daily Hadassah Pais, PA   100 mg at 10/12/11 1043  . midazolam (VERSED) 2 MG/2ML injection           . mulitivitamin with minerals tablet 1 tablet  1 tablet Oral Daily Iran Ouch, MD   1 tablet at 10/12/11 1657  . nitroGLYCERIN (NTG ON-CALL) 0.2 mg/mL injection           . omega-3 acid ethyl esters (LOVAZA) capsule 2 g  2 g Oral Daily Iran Ouch, MD   2 g at 10/12/11 1657  . ondansetron (ZOFRAN) injection 4 mg  4 mg Intravenous Q6H PRN Hadassah Pais, PA      . pantoprazole (PROTONIX) EC tablet 40 mg  40 mg Oral Q1200 Iran Ouch, MD   40 mg at 10/12/11 1712  . potassium chloride SA (K-DUR,KLOR-CON) CR tablet 40 mEq  40 mEq Oral Once Iran Ouch, MD   40 mEq at 10/12/11 1051  . rosuvastatin (CRESTOR) tablet 40 mg  40 mg Oral q1800 Hadassah Pais, PA   40 mg at 10/12/11 2035  . sodium chloride 0.9 % injection 3 mL  3 mL Intravenous Q12H Hadassah Pais, PA   3 mL at 10/12/11 1046  . sodium chloride 0.9 % injection 3 mL  3 mL  Intravenous PRN Hadassah Pais, PA      . testosterone cypionate (DEPOTESTOTERONE CYPIONATE) injection 200 mg  200 mg Intramuscular Q14 Days Iran Ouch, MD   200 mg at 10/12/11 1739  . vitamin E capsule 400 Units  400 Units Oral Daily Iran Ouch, MD   400 Units at 10/12/11 1657  . DISCONTD: 0.9 %  sodium chloride infusion  250 mL Intravenous PRN Cassell Clement, MD      . DISCONTD: 0.9 %  sodium chloride infusion  1 mL/kg/hr Intravenous Continuous Cassell Clement, MD 105.2 mL/hr at 10/12/11 0445 1 mL/kg/hr at 10/12/11 0445  . DISCONTD: aspirin chewable tablet 81 mg  81 mg Oral Daily Iran Ouch, MD      . DISCONTD: heparin ADULT infusion 100 units/ml (25000 units/250 ml)  1,850 Units/hr Intravenous Continuous Iran Ouch, MD 18.5 mL/hr at 10/11/11 2305 1,850 Units/hr at 10/11/11 2305  . DISCONTD: losartan (COZAAR) tablet 100 mg  100 mg Oral Daily Iran Ouch, MD      . DISCONTD: multivitamins ther. w/minerals tablet 1 tablet  1 tablet Oral Daily Iran Ouch, MD      . DISCONTD: sodium chloride 0.9 % injection 3 mL  3 mL Intravenous Q12H Cassell Clement, MD   3 mL at 10/11/11 2259  . DISCONTD: sodium chloride 0.9 % injection 3 mL  3 mL Intravenous PRN Cassell Clement, MD        Allergies  Allergen Reactions  . Penicillins Palpitations    Review of Systems:  General:  normal appetite, normal energy   Respiratory:  + dry cough last week prior to admission, improved since diuretic therapy, no wheezing, no hemoptysis, no pain with inspiration or cough, + shortness of breath now much improved since diuretic therapy  Cardiac:   no chest pain or tightness, + exertional SOB, + resting SOB, no PND, + orthopnea, no LE edema, no palpitations, no syncope  GI:   no difficulty swallowing, no hematochezia, no hematemesis, no melena, no constipation, no diarrhea   GU:   no dysuria, no urgency, no frequency   Musculoskeletal: mild arthritis lower back, no arthralgia    Vascular:  no pain suggestive of claudication   Neuro:   no symptoms suggestive of TIA's, no seizures, no headaches, no peripheral neuropathy   Endocrine:  Checks sugars which have been well controlled  HEENT:  no loose teeth or painful teeth, wears dentures,  no recent vision changes  Psych:   no anxiety, no depression    Physical Exam:   BP 145/85  Pulse 87  Temp(Src) 97.4 F (36.3 C) (Oral)  Resp 20  Ht 6\' 5"  (1.956 m)  Wt 103.5 kg (228 lb 2.8 oz)  BMI 27.06 kg/m2  SpO2 95%  General:    well-appearing  HEENT:  Unremarkable   Neck:   no JVD, no bruits, no adenopathy   Chest:   clear to auscultation, symmetrical breath sounds, no wheezes, no rhonchi   CV:   RRR, grade II/VI systolic murmur   Abdomen:  soft, non-tender, no masses   Extremities:  warm, well-perfused, pulses diminished  Rectal/GU  Deferred  Neuro:   Grossly non-focal and symmetrical throughout  Skin:   Clean and dry, no rashes, no breakdown    Diagnostic Tests:  Cardiac Catheterization Procedure Note  Procedural Findings:  Hemodynamics  RA 9 mmHg  RV 46/8 mmHg  PA 48/25 mmHg  PCWP 22 mmHg. large V waves.  LV 137/21 mmHg . LVEDP: 27 mmHg  AO 137/82 mmHg  Oxygen saturations:  PA 66  AO 94  Cardiac Output (Fick) 5.8  Cardiac Index (Fick) 2.48  Aortic Valve: Peak to Peak gradient: Not significant  Pulmonary vascular resistance (PVR): 2 Woods units.  Coronary angiography:  Coronary dominance: Codominant.  Left Main: Normal in size and mildly calcified. Minor irregularities without evidence of obstructive disease.  Left  Anterior Descending (LAD): Normal in size and moderately calcified. It wraps around the apex distally. There is a 40% proximal lesion close to the ostium. There is a 70-80% eccentric stenosis in the midsegment before the second diagonal. In the midsegment after the second diagonal there is another 60% stenosis. After that, there is mild diffuse disease.  1st diagonal (D1): Very small in  size.  2nd diagonal (D2): Normal in size with minor irregularities.  3rd diagonal (D3): Small in size and free of significant disease.  Circumflex (LCx): Normal in size and codominant. It has moderate calcifications. There is a 30% proximal stenosis at OM1. The rest of the vessel has minor irregularities.  1st obtuse marginal: Appears to be normal in size and occluded proximally. It has collaterals from the left anterior descending artery.  2nd obtuse marginal: Small in size.  3rd obtuse marginal: Medium in size with mild 30% stenosis proximally.  The posterior AV groove artery is normal in size with minor irregularities. It gives to a medium size posterolateral branches.  Right Coronary Artery: The vessel is medium in size and heavily calcified. There is 40% proximal disease. The vessel is occluded in the midsegment after an RV branch with extensive bridging collaterals.  posterior descending artery: Normal in size and fills via collaterals. It appears to be a good target for CABG. Left ventriculography: It was done with manual injection due to chronic kidney disease. Left ventricular systolic function is severely reduced , LVEF is estimated at 25% %, could not evaluate the severity of mitral regurgitation .    Transthoracic Echocardiogram Performed 10/10/2011 at [facility] is reviewed  Severe LV dysfunction with global LV chamber enlargement and severe global hypokinesis, inferior and lateral akinesis.  Type IIIb mitral valve dysfunction with at least moderate (3+) mitral regurgitation.    Impression:  Patient has severe ischemic cardiomyopathy with severe three-vessel coronary artery disease and at least moderate mitral regurgitation. Functional anatomy of mitral valve disease appears to be purely ischemic with type IIIB dysfunction. The patient has diabetes with diffusely diseased coronary arteries and I expect relatively poor target vessels in the inferior and lateral wall. The  patient also has at least mild renal insufficiency at baseline. Risks of surgery will be very high. Prognosis without surgery would be extremely poor.   Plan:  I discussed matters at length with Wayne Gordon and his wife this evening. The rationale for surgical intervention has been discussed in prognosis with both surgery and medical therapy reviewed in detail. They understand very high-risk nature of surgery including significant potential for death, stroke, myocardial infarction, congestive heart failure, renal failure, respiratory failure, late recurrence of congestive heart failure and/or ischemic coronary artery disease. We will plan to follow along and proceed with further routine diagnostic workup. We will need to make sure that his renal function is at baseline before proceeding with surgery. All questions have been answered.    Salvatore Decent. Cornelius Moras, MD 10/12/2011 9:13 PM

## 2011-10-12 NOTE — Op Note (Signed)
Cardiac Catheterization Procedure Note  Name: Wayne Gordon MRN: [id number] DOB: Aug 08, 1934  Procedure: Right Heart Cath, Left Heart Cath, Selective Coronary Angiography, LV angiography  Indication: Non-ST elevation myocardial infarction with new onset systolic heart failure.   Procedural Details: The right groin was prepped, draped, and anesthetized with 1% lidocaine. Using the modified Seldinger technique a 5 French sheath was placed in the right femoral artery and a 7 French sheath was placed in the right femoral vein. A Swan-Ganz catheter was used for the right heart catheterization. Standard protocol was followed for recording of right heart pressures and sampling of oxygen saturations. Fick cardiac output was calculated. Standard Judkins catheters were used for selective coronary angiography and left ventriculography. There were no immediate procedural complications. The patient was transferred to the post catheterization recovery area for further monitoring.  Procedural Findings: Hemodynamics RA 9 mmHg RV 46/8 mmHg PA 48/25 mmHg PCWP 22 mmHg. large V waves. LV 137/21 mmHg . LVEDP: 27 mmHg AO 137/82 mmHg  Oxygen saturations: PA 66 AO 94  Cardiac Output (Fick) 5.8  Cardiac Index (Fick) 2.48   Aortic Valve: Peak to Peak gradient: Not significant Pulmonary vascular resistance (PVR): 2 Woods units.   Coronary angiography: Coronary dominance: Codominant.   Left Main:  Normal in size and mildly calcified. Minor irregularities without evidence of obstructive disease.  Left Anterior Descending (LAD):  Normal in size and moderately calcified. It wraps around the apex distally. There is a 40% proximal lesion close to the ostium. There is a 70-80% eccentric stenosis in the midsegment before the second diagonal. In the midsegment after the second diagonal there is another 60% stenosis. After that, there is mild diffuse disease.   1st diagonal (D1):  Very small in size.  2nd diagonal  (D2):  Normal in size with minor irregularities.  3rd diagonal (D3):  Small in size and free of significant disease.  Circumflex (LCx):  Normal in size and codominant. It has moderate calcifications. There is a 30% proximal stenosis at OM1. The rest of the vessel has minor irregularities.  1st obtuse marginal:  Appears to be normal in size and occluded proximally. It has collaterals from the left anterior descending artery.  2nd obtuse marginal:  Small in size.  3rd obtuse marginal:  Medium in size with mild 30% stenosis proximally.  The posterior AV groove artery is normal in size with minor irregularities. It gives to a medium size posterolateral branches.   Right Coronary Artery: The vessel is medium in size and heavily calcified. There is 40% proximal disease. The vessel is occluded in the midsegment after an RV branch with extensive bridging collaterals.  posterior descending artery:  Normal in size and fills via collaterals. It appears to be a good target for CABG. Left ventriculography: It was done with manual injection due to chronic kidney disease. Left ventricular systolic function is severely reduced , LVEF is estimated at 25% %, could not evaluate the severity of mitral regurgitation .   Final Conclusions:   1. Moderately elevated filling pressures with mild pulmonary hypertension. 2. Severely reduced LV systolic function with moderate to severe mitral regurgitation by echocardiogram. 3. Severe three-vessel coronary artery disease.  Recommendations: I recommend coronary artery bypass graft surgery and mitral valve repair. He has good targets in LAD, OM1 and right PDA. I will resume his oral Lasix as his filling pressures are moderately elevated.  Lorine Bears MD, [facility] 10/12/2011, 12:59 PM

## 2011-10-13 ENCOUNTER — Inpatient Hospital Stay (HOSPITAL_COMMUNITY): Payer: Medicare Other

## 2011-10-13 DIAGNOSIS — I2510 Atherosclerotic heart disease of native coronary artery without angina pectoris: Secondary | ICD-10-CM

## 2011-10-13 DIAGNOSIS — I059 Rheumatic mitral valve disease, unspecified: Secondary | ICD-10-CM

## 2011-10-13 LAB — BLOOD GAS, ARTERIAL
Acid-base deficit: 0.5 mmol/L (ref 0.0–2.0)
Drawn by: 35135
O2 Saturation: 96 %
Patient temperature: 98.6
pCO2 arterial: 37.7 mmHg (ref 35.0–45.0)

## 2011-10-13 LAB — URINALYSIS, ROUTINE W REFLEX MICROSCOPIC
Ketones, ur: NEGATIVE mg/dL
Leukocytes, UA: NEGATIVE
Nitrite: NEGATIVE
Protein, ur: NEGATIVE mg/dL
Urobilinogen, UA: 1 mg/dL (ref 0.0–1.0)
pH: 6 (ref 5.0–8.0)

## 2011-10-13 LAB — COMPREHENSIVE METABOLIC PANEL
AST: 17 U/L (ref 0–37)
CO2: 27 mEq/L (ref 19–32)
Calcium: 9.1 mg/dL (ref 8.4–10.5)
Creatinine, Ser: 1.44 mg/dL — ABNORMAL HIGH (ref 0.50–1.35)
GFR calc non Af Amer: 46 mL/min — ABNORMAL LOW (ref >90)

## 2011-10-13 LAB — PROTIME-INR
INR: 1.04 (ref 0.00–1.49)
Prothrombin Time: 13.8 seconds (ref 11.6–15.2)

## 2011-10-13 LAB — PULMONARY FUNCTION TEST

## 2011-10-13 LAB — CBC
HCT: 41.3 % (ref 39.0–52.0)
MCV: 88.2 fL (ref 78.0–100.0)
RBC: 4.68 MIL/uL (ref 4.22–5.81)
WBC: 5.4 10*3/uL (ref 4.0–10.5)

## 2011-10-13 LAB — GLUCOSE, CAPILLARY
Glucose-Capillary: 126 mg/dL — ABNORMAL HIGH (ref 70–99)
Glucose-Capillary: 124 mg/dL — ABNORMAL HIGH (ref 70–99)

## 2011-10-13 NOTE — Progress Notes (Signed)
Patient doing much better since cardiologist, Dr. Charm Barges came in to assess his minimal bleeding last night. No new episode of bleeding, level 1 (small bruising), no hematoma, distal pulses equal to baseline bilaterally. Vital signs within patient's norm. Removed gauze and dry sterile dressing and applied bandaid. Will provide update to dayshift nurse.

## 2011-10-13 NOTE — Progress Notes (Signed)
Patient ID: Wayne Gordon, male   DOB: Nov 20, 1933, 78 y.o.   MRN: [id number] SUBJECTIVE:  Patient is doing well today. He underwent cardiac catheterization yesterday. He has been seen by Dr.Owen today. Plans are being finalize for his bypass surgery and mitral valve repair next Monday. Patient is stable. Not having any chest pain.   Filed Vitals:   10/12/11 1600 10/12/11 1701 10/12/11 2125 10/13/11 0423  BP: 123/79 115/75 127/66 120/76  Pulse: 78 81 68 81  Temp:   98.8 F (37.1 C) 98.6 F (37 C)  TempSrc:   Oral Oral  Resp:   18 18  Height:      Weight:    227 lb 15.3 oz (103.4 kg)  SpO2:   96% 96%    Intake/Output Summary (Last 24 hours) at 10/13/11 1150 Last data filed at 10/13/11 1107  Gross per 24 hour  Intake 2098.75 ml  Output   2050 ml  Net  48.75 ml    LABS: Basic Metabolic Panel:  Basename 10/13/11 0650 10/12/11 1448 10/12/11 0530  NA 137 -- 132*  K 4.1 -- 3.3*  CL 102 -- 95*  CO2 27 -- 27  GLUCOSE 99 -- 110*  BUN 18 -- 24*  CREATININE 1.44* 1.29 --  CALCIUM 9.1 -- 8.8  MG -- -- --  PHOS -- -- --   Liver Function Tests:  Basename 10/13/11 0650  AST 17  ALT 21  ALKPHOS 41  BILITOT 0.7  PROT 6.6  ALBUMIN 3.8   No results found for this basename: LIPASE:2,AMYLASE:2 in the last 72 hours CBC:  Basename 10/13/11 0650 10/12/11 1448  WBC 5.4 6.3  NEUTROABS -- --  HGB 14.3 14.6  HCT 41.3 40.6  MCV 88.2 86.9  PLT 227 233   Cardiac Enzymes: No results found for this basename: CKTOTAL:3,CKMB:3,CKMBINDEX:3,TROPONINI:3 in the last 72 hours BNP: No components found with this basename: POCBNP:3 D-Dimer: No results found for this basename: DDIMER:2 in the last 72 hours Hemoglobin A1C: No results found for this basename: HGBA1C in the last 72 hours Fasting Lipid Panel: No results found for this basename: CHOL,HDL,LDLCALC,TRIG,CHOLHDL,LDLDIRECT in the last 72 hours Thyroid Function Tests: No results found for this basename:  TSH,T4TOTAL,FREET3,T3FREE,THYROIDAB in the last 72 hours  RADIOLOGY: Dg Chest 2 View  10/13/2011  *RADIOLOGY REPORT*  Clinical Data: Preoperative chest radiograph.  History of hypertension and heart murmur.  CHEST - 2 VIEW  Comparison: 10/08/2011.  Findings: Bosselation of the right hemidiaphragm.  The lungs are clear.  Cardiopericardial silhouette is upper limits of normal for projection.  There is no airspace disease.  No effusion.  Thoracic spine DISH.  Mediastinal contours are within normal limits.  IMPRESSION: No active cardiopulmonary disease.  Borderline heart size.  Original Report Authenticated By: Andreas Newport, M.D.    PHYSICAL EXAM  Patient is oriented to person time and place. Affect is normal. He is a friend visiting at this time. There is no jugulovenous distention. Lungs are clear. Respiratory effort is nonlabored. Cardiac exam reveals S1 and S2 and a systolic murmur. The abdomen is soft. He has no peripheral edema.   TELEMETRY: I personally reviewed telemetry. There is normal sinus rhythm.   ASSESSMENT AND PLAN:   NSTEMI (non-ST elevated myocardial infarction)  Patient is stable. He is scheduled for surgery.   Acute systolic heart failure His volume status appears stable at this time. No change his medicines. Repeat 2-D echo has been ordered.   Mitral regurgitation The patient was mitral valve  repair with his upcoming surgery.   Willa Rough 10/13/2011 11:50 AM

## 2011-10-13 NOTE — Progress Notes (Signed)
  Echocardiogram 2D Echocardiogram has been performed.  Wayne Gordon 10/13/2011, 11:30 AM

## 2011-10-13 NOTE — Progress Notes (Signed)
   CARDIOTHORACIC SURGERY PROGRESS NOTE  1 Day Post-Op  S/P Procedure(s) (LRB): LEFT AND RIGHT HEART CATHETERIZATION WITH CORONARY ANGIOGRAM (N/A)  Subjective: Feels okay. No chest pain. No SOB  Objective: Vital signs in last 24 hours: Temp:  [97.4 F (36.3 C)-98.8 F (37.1 C)] 98.6 F (37 C) (02/14 0423) Pulse Rate:  [1-98] 81  (02/14 0423) Cardiac Rhythm:  [-] Heart block (02/14 0745) Resp:  [18-20] 18  (02/14 0423) BP: (115-145)/(66-110) 120/76 mmHg (02/14 0423) SpO2:  [95 %-96 %] 96 % (02/14 0423) Weight:  [103.4 kg (227 lb 15.3 oz)] 103.4 kg (227 lb 15.3 oz) (02/14 0423)  Physical Exam:  Rhythm:   sinus  Breath sounds: clear  Heart sounds:  RRR with murmur  Incisions:  n/a  Abdomen:  soft  Extremities:  warm   Intake/Output from previous day: 02/13 0701 - 02/14 0700 In: 1258.8 [P.O.:620; I.V.:218.8] Out: 1925 [Urine:1925] Intake/Output this shift: Total I/O In: 940 [P.O.:940] Out: -   Lab Results:  Basename 10/13/11 0650 10/12/11 1448  WBC 5.4 6.3  HGB 14.3 14.6  HCT 41.3 40.6  PLT 227 233   BMET:  Basename 10/13/11 0650 10/12/11 1448 10/12/11 0530  NA 137 -- 132*  K 4.1 -- 3.3*  CL 102 -- 95*  CO2 27 -- 27  GLUCOSE 99 -- 110*  BUN 18 -- 24*  CREATININE 1.44* 1.29 --  CALCIUM 9.1 -- 8.8    CBG (last 3)   Basename 10/13/11 0611 10/12/11 2122 10/12/11 1649  GLUCAP 89 126* 102*   PT/INR:   Basename 10/13/11 0650  LABPROT 13.8  INR 1.04    CXR:  clear  Assessment/Plan: S/P Procedure(s) (LRB): LEFT AND RIGHT HEART CATHETERIZATION WITH CORONARY ANGIOGRAM (N/A)  We tentatively plan CABG and mitral repair on Monday.  All questions answered.  Jacki Couse H 10/13/2011 10:56 AM

## 2011-10-14 ENCOUNTER — Other Ambulatory Visit: Payer: Self-pay

## 2011-10-14 DIAGNOSIS — I2510 Atherosclerotic heart disease of native coronary artery without angina pectoris: Secondary | ICD-10-CM

## 2011-10-14 DIAGNOSIS — I059 Rheumatic mitral valve disease, unspecified: Secondary | ICD-10-CM

## 2011-10-14 LAB — GLUCOSE, CAPILLARY
Glucose-Capillary: 81 mg/dL (ref 70–99)
Glucose-Capillary: 125 mg/dL — ABNORMAL HIGH (ref 70–99)

## 2011-10-14 LAB — BASIC METABOLIC PANEL
CO2: 29 mEq/L (ref 19–32)
Glucose, Bld: 87 mg/dL (ref 70–99)
Potassium: 3.5 mEq/L (ref 3.5–5.1)
Sodium: 138 mEq/L (ref 135–145)

## 2011-10-14 LAB — CBC
Hemoglobin: 13.9 g/dL (ref 13.0–17.0)
Platelets: 218 10*3/uL (ref 150–400)
RBC: 4.56 MIL/uL (ref 4.22–5.81)
WBC: 6 10*3/uL (ref 4.0–10.5)

## 2011-10-14 MED ORDER — LORAZEPAM 0.5 MG PO TABS: 0.5000 mg | ORAL_TABLET | ORAL | Status: DC | PRN

## 2011-10-14 NOTE — Progress Notes (Signed)
10/14/11  1825 Pt. had cath on 2/13 which revealed 3 vessel disease and Mitral valve regurg.  Plan for CABG and MVR on Monday 2/18.    UR Completed. Tera Mater, RN, BSn [phone]

## 2011-10-14 NOTE — Progress Notes (Signed)
   CARDIOTHORACIC SURGERY PROGRESS NOTE  2 Days Post-Op  S/P Procedure(s) (LRB): LEFT AND RIGHT HEART CATHETERIZATION WITH CORONARY ANGIOGRAM (N/A)  Subjective: Feels well. No SOB  Objective: Vital signs in last 24 hours: Temp:  [97.1 F (36.2 C)-99.2 F (37.3 C)] 97.6 F (36.4 C) (02/15 1410) Pulse Rate:  [63-79] 70  (02/15 1410) Cardiac Rhythm:  [-] Heart block (02/14 2005) Resp:  [18-20] 20  (02/15 1410) BP: (101-129)/(53-70) 116/69 mmHg (02/15 1410) SpO2:  [96 %-99 %] 96 % (02/15 1410) Weight:  [102.105 kg (225 lb 1.6 oz)] 102.105 kg (225 lb 1.6 oz) (02/15 0537)  Physical Exam:  Rhythm:   sinus  Breath sounds: clear  Heart sounds:  RRR  Incisions:  n/a  Abdomen:  soft  Extremities:  warm   Intake/Output from previous day: 02/14 0701 - 02/15 0700 In: 1930 [P.O.:1660] Out: 1650 [Urine:1650] Intake/Output this shift: Total I/O In: 723 [P.O.:720; I.V.:3] Out: 1050 [Urine:1050]  Lab Results:  Basename 10/14/11 0600 10/13/11 0650  WBC 6.0 5.4  HGB 13.9 14.3  HCT 40.8 41.3  PLT 218 227   BMET:  Basename 10/14/11 0600 10/13/11 0650  NA 138 137  K 3.5 4.1  CL 100 102  CO2 29 27  GLUCOSE 87 99  BUN 21 18  CREATININE 1.72* 1.44*  CALCIUM 9.3 9.1    CBG (last 3)   Basename 10/14/11 1151 10/14/11 1019 10/14/11 0637  GLUCAP 125* 178* 81   PT/INR:   Basename 10/13/11 0650  LABPROT 13.8  INR 1.04    CXR:   CHEST - 2 VIEW   Comparison: 10/08/2011.   Findings: Bosselation of the right hemidiaphragm.  The lungs are clear.  Cardiopericardial silhouette is upper limits of normal for projection.  There is no airspace disease.  No effusion.  Thoracic spine DISH.  Mediastinal contours are within normal limits.   IMPRESSION: No active cardiopulmonary disease.  Borderline heart size.   Original Report Authenticated By: Andreas Newport, M.D.      Carotid Duplex: Noninvasive Vascular Lab  Preoperative Vascular Evaluation  Summary: Right: No evidence  of significant ICA stenosis. ICA/CCA ratio is 0.89. Left: 60-79% ICA stenosis. ICA/CCA ratio is 2.42. Bilateral: Vertebral artery flow is antegrade. ABI is not ascertained due to calcified vessels(the right anterior tibial and left posterior tibial arteries are non compressible).      Prepared and Electronically Authenticated by  Fabienne Bruns, MD 2013-02-13T18:40:21.120   Assessment/Plan: S/P Procedure(s) (LRB): LEFT AND RIGHT HEART CATHETERIZATION WITH CORONARY ANGIOGRAM (N/A)  Clinically stable.  For OR Monday.  Asymptomatic moderate left ICA stenosis will not alter plans.  Purcell Nails 10/14/2011 6:33 PM

## 2011-10-14 NOTE — Progress Notes (Signed)
Subjective:   Wayne Gordon is a 78 y.o. gentleman with CAD, CHF, and MR.  He had a cath Wednesday and is scheduled for CABG / MVR on Monday with Dr. Cornelius Moras.  He denies any chest pain or dyspnea.  He did have some chills last night but no real fever.     [name] Kitchen aspirin EC  81 mg Oral Daily  . carvedilol  3.125 mg Oral BID WC  . enoxaparin  40 mg Subcutaneous Q24H  . furosemide  40 mg Oral BID  . insulin glargine  15 Units Subcutaneous QHS  . losartan  100 mg Oral Daily  . mulitivitamin with minerals  1 tablet Oral Daily  . omega-3 acid ethyl esters  2 g Oral Daily  . pantoprazole  40 mg Oral Q1200  . rosuvastatin  40 mg Oral q1800  . sodium chloride  3 mL Intravenous Q12H  . testosterone cypionate  200 mg Intramuscular Q14 Days  . vitamin E  400 Units Oral Daily      Objective:  Vital Signs in the last 24 hours: Blood pressure 101/53, pulse 63, temperature 99.2 F (37.3 C), temperature source Oral, resp. rate 18, height 6\' 5"  (1.956 m), weight 225 lb 1.6 oz (102.105 kg), SpO2 96.00%. Temp:  [97.7 F (36.5 C)-99.2 F (37.3 C)] 99.2 F (37.3 C) (02/15 0700) Pulse Rate:  [63-78] 63  (02/15 0700) Resp:  [18] 18  (02/15 0700) BP: (101-113)/(53-70) 101/53 mmHg (02/15 0700) SpO2:  [96 %-99 %] 96 % (02/15 0700) Weight:  [225 lb 1.6 oz (102.105 kg)] 225 lb 1.6 oz (102.105 kg) (02/15 0537)  Intake/Output from previous day: 02/14 0701 - 02/15 0700 In: 1930 [P.O.:1660] Out: 1650 [Urine:1650] Intake/Output from this shift: Total I/O In: 240 [P.O.:240] Out: 250 [Urine:250]  Physical Exam:  Physical Exam: Blood pressure 101/53, pulse 63, temperature 99.2 F (37.3 C), temperature source Oral, resp. rate 18, height 6\' 5"  (1.956 m), weight 225 lb 1.6 oz (102.105 kg), SpO2 96.00%. General: Well developed, well nourished, in no acute distress. Head: Normocephalic, atraumatic, sclera non-icteric, mucus membranes are moist,  Neck: Supple. Negative for carotid bruits. JVD not  elevated. Lungs: Clear bilaterally to auscultation without wheezes, rales, or rhonchi. Breathing is unlabored. Heart: RRR with S1 S2. Soft systolic murmur.  Abdomen: Soft, non-tender, non-distended with normoactive bowel sounds. No hepatomegaly. No rebound/guarding. No obvious abdominal masses. Msk:  Strength and tone appear normal for age. Extremities: No clubbing or cyanosis. No edema.  Distal pedal pulses are 2+ and equal bilaterally. Neuro: Alert and oriented X 3. Moves all extremities spontaneously. Psych:  Responds to questions appropriately with a normal affect.    Lab Results:   Basename 10/14/11 0600 10/13/11 0650  NA 138 137  K 3.5 4.1  CL 100 102  CO2 29 27  GLUCOSE 87 99  BUN 21 18  CREATININE 1.72* 1.44*  CALCIUM 9.3 9.1  MG -- --  PHOS -- --    Basename 10/13/11 0650  AST 17  ALT 21  ALKPHOS 41  BILITOT 0.7  PROT 6.6  ALBUMIN 3.8   No results found for this basename: LIPASE:2,AMYLASE:2 in the last 72 hours  Basename 10/14/11 0600 10/13/11 0650  WBC 6.0 5.4  NEUTROABS -- --  HGB 13.9 14.3  HCT 40.8 41.3  MCV 89.5 88.2  PLT 218 227   No results found for this basename: CKTOTAL:4,CKMB:4,TROPONINI:4 in the last 72 hours No components found with this basename: POCBNP:3 No results found for  this basename: DDIMER in the last 72 hours  Basename 10/13/11 0650  HGBA1C 6.2*   No results found for this basename: CHOL,HDL,LDLCALC,TRIG,CHOLHDL in the last 72 hours No results found for this basename: TSH,T4TOTAL,FREET3,T3FREE,THYROIDAB in the last 72 hours No results found for this basename: VITAMINB12,FOLATE,FERRITIN,TIBC,IRON,RETICCTPCT in the last 72 hours   Tele:  NSR  Assessment/Plan:   1. CAD : for cabg Monday  2. MR: for MVR on Monday  3. CHF:  Chronic systolic heart failure.  Most likely due to ischemia and MR.  Will make his recovery from MV repair more challenging.    Disposition: surgery Monday.  Vesta Mixer, Montez Hageman., MD,  [facility] 10/14/2011, 9:33 AM LOS: Day 4

## 2011-10-15 DIAGNOSIS — I5021 Acute systolic (congestive) heart failure: Secondary | ICD-10-CM

## 2011-10-15 LAB — TYPE AND SCREEN
ABO/RH(D): A POS
Antibody Screen: NEGATIVE

## 2011-10-15 LAB — CBC
MCH: 30.9 pg (ref 26.0–34.0)
MCV: 88.7 fL (ref 78.0–100.0)
Platelets: 223 10*3/uL (ref 150–400)
RDW: 12.9 % (ref 11.5–15.5)
WBC: 6.3 10*3/uL (ref 4.0–10.5)

## 2011-10-15 LAB — GLUCOSE, CAPILLARY: Glucose-Capillary: 100 mg/dL — ABNORMAL HIGH (ref 70–99)

## 2011-10-15 MED ORDER — CHLORHEXIDINE GLUCONATE 4 % EX LIQD
60.0000 mL | Freq: Once | CUTANEOUS | Status: DC
  Filled 2011-10-15: qty 60

## 2011-10-15 MED ORDER — CHLORHEXIDINE GLUCONATE 4 % EX LIQD
60.0000 mL | Freq: Once | CUTANEOUS | Status: AC
  Administered 2011-10-16: 4 via TOPICAL
  Filled 2011-10-15 (×2): qty 60

## 2011-10-15 MED ORDER — TEMAZEPAM 15 MG PO CAPS: 15.0000 mg | ORAL_CAPSULE | Freq: Once | ORAL | Status: AC | PRN

## 2011-10-15 MED ORDER — BISACODYL 5 MG PO TBEC
5.0000 mg | DELAYED_RELEASE_TABLET | Freq: Once | ORAL | Status: DC
  Filled 2011-10-15: qty 1

## 2011-10-15 MED ORDER — METOPROLOL TARTRATE 12.5 MG HALF TABLET
12.5000 mg | ORAL_TABLET | Freq: Once | ORAL | Status: AC
  Administered 2011-10-17: 12.5 mg via ORAL
  Filled 2011-10-15: qty 1

## 2011-10-15 MED ORDER — CHLORHEXIDINE GLUCONATE 4 % EX LIQD
60.0000 mL | Freq: Once | CUTANEOUS | Status: AC
  Administered 2011-10-17: 4 via TOPICAL
  Filled 2011-10-15 (×2): qty 60

## 2011-10-15 MED ORDER — BISACODYL 5 MG PO TBEC
5.0000 mg | DELAYED_RELEASE_TABLET | Freq: Once | ORAL | Status: AC
  Administered 2011-10-16: 5 mg via ORAL
  Filled 2011-10-15: qty 5

## 2011-10-15 NOTE — Progress Notes (Signed)
   CARDIOTHORACIC SURGERY PROGRESS NOTE  3 Days Post-Op  S/P Procedure(s) (LRB): LEFT AND RIGHT HEART CATHETERIZATION WITH CORONARY ANGIOGRAM (N/A)  Subjective: Feels fine  Objective: Vital signs in last 24 hours: Temp:  [97.9 F (36.6 C)-98.8 F (37.1 C)] 98.8 F (37.1 C) (02/16 1417) Pulse Rate:  [61-73] 67  (02/16 1417) Cardiac Rhythm:  [-] Heart block (02/15 1950) Resp:  [19-20] 20  (02/16 1417) BP: (110-121)/(69-74) 120/72 mmHg (02/16 1417) SpO2:  [96 %-97 %] 97 % (02/16 1417) Weight:  [101.2 kg (223 lb 1.7 oz)] 101.2 kg (223 lb 1.7 oz) (02/16 0500)  Physical Exam:  Rhythm:   sinus  Breath sounds: clear  Heart sounds:  RRR with murmur  Incisions:  n/a  Abdomen:  soft  Extremities:  warm   Intake/Output from previous day: 02/15 0701 - 02/16 0700 In: 723 [P.O.:720; I.V.:3] Out: 1775 [Urine:1775] Intake/Output this shift: Total I/O In: 600 [P.O.:600] Out: 275 [Urine:275]  Lab Results:  Basename 10/15/11 0530 10/14/11 0600  WBC 6.3 6.0  HGB 14.2 13.9  HCT 40.7 40.8  PLT 223 218   BMET:  Basename 10/14/11 0600 10/13/11 0650  NA 138 137  K 3.5 4.1  CL 100 102  CO2 29 27  GLUCOSE 87 99  BUN 21 18  CREATININE 1.72* 1.44*  CALCIUM 9.3 9.1    CBG (last 3)   Basename 10/15/11 1245 10/15/11 0602 10/14/11 2131  GLUCAP 119* 100* 117*   PT/INR:   Basename 10/13/11 0650  LABPROT 13.8  INR 1.04    CXR:  N/A  Assessment/Plan: S/P Procedure(s) (LRB): LEFT AND RIGHT HEART CATHETERIZATION WITH CORONARY ANGIOGRAM (N/A)  Tentatively for OR Monday but creatinine increased today.  May need to postpone surgery if it remains elevated tomorrow.  Wayne Gordon H 10/15/2011 2:31 PM

## 2011-10-15 NOTE — Progress Notes (Signed)
    Subjective:  Feels well. No chest pain or dyspnea.  Objective:  Vital Signs in the last 24 hours: Temp:  [97.1 F (36.2 C)-98 F (36.7 C)] 98 F (36.7 C) (02/16 0500) Pulse Rate:  [61-79] 61  (02/16 0500) Resp:  [19-20] 19  (02/16 0500) BP: (110-129)/(61-74) 110/69 mmHg (02/16 0500) SpO2:  [96 %-98 %] 97 % (02/16 0500) Weight:  [101.2 kg (223 lb 1.7 oz)] 101.2 kg (223 lb 1.7 oz) (02/16 0500)  Intake/Output from previous day: 02/15 0701 - 02/16 0700 In: 723 [P.O.:720; I.V.:3] Out: 1775 [Urine:1775]  Physical Exam: Pt is alert and oriented, NAD HEENT: normal Neck: JVP - normal, carotids 2+= without bruits Lungs: CTA bilaterally CV: RRR with 2/6 systolic murmur at the apex Abd: soft, NT, Positive BS, no hepatomegaly Ext: no C/C/E, distal pulses intact and equal Skin: warm/dry no rash   Lab Results:  Basename 10/15/11 0530 10/14/11 0600  WBC 6.3 6.0  HGB 14.2 13.9  PLT 223 218    Basename 10/14/11 0600 10/13/11 0650  NA 138 137  K 3.5 4.1  CL 100 102  CO2 29 27  GLUCOSE 87 99  BUN 21 18  CREATININE 1.72* 1.44*   No results found for this basename: TROPONINI:2,CK,MB:2 in the last 72 hours  Tele: sinus rhythm with PVC's  Assessment/Plan:  1. Acute on chronic systolic heart failure 2. Multivessel CAD 3. Severe mitral regurgitation 4. CKD, stage 3  Pt stable going into CABG, MV repair Monday. Creatinine was trending up yesterday. Will hold furosemide today and repeat BMET in AM. He has been on losartan chronically so will continue with this.  Tonny Bollman, M.D. 10/15/2011, 9:44 AM

## 2011-10-16 LAB — CBC
Hemoglobin: 13.2 g/dL (ref 13.0–17.0)
MCH: 29.3 pg (ref 26.0–34.0)
MCV: 89.3 fL (ref 78.0–100.0)
RBC: 4.5 MIL/uL (ref 4.22–5.81)

## 2011-10-16 LAB — BASIC METABOLIC PANEL
BUN: 24 mg/dL — ABNORMAL HIGH (ref 6–23)
CO2: 29 mEq/L (ref 19–32)
Calcium: 9.3 mg/dL (ref 8.4–10.5)
Creatinine, Ser: 1.65 mg/dL — ABNORMAL HIGH (ref 0.50–1.35)
Glucose, Bld: 96 mg/dL (ref 70–99)

## 2011-10-16 LAB — GLUCOSE, CAPILLARY: Glucose-Capillary: 87 mg/dL (ref 70–99)

## 2011-10-16 MED ORDER — DOPAMINE-DEXTROSE 3.2-5 MG/ML-% IV SOLN
2.0000 ug/kg/min | INTRAVENOUS | Status: AC
  Administered 2011-10-17: 3 ug/kg/min via INTRAVENOUS
  Filled 2011-10-16: qty 250

## 2011-10-16 MED ORDER — MOXIFLOXACIN HCL IN NACL 400 MG/250ML IV SOLN
400.0000 mg | INTRAVENOUS | Status: DC
  Filled 2011-10-16: qty 250

## 2011-10-16 MED ORDER — POTASSIUM CHLORIDE 2 MEQ/ML IV SOLN
80.0000 meq | INTRAVENOUS | Status: DC
  Filled 2011-10-16: qty 40

## 2011-10-16 MED ORDER — EPINEPHRINE HCL 1 MG/ML IJ SOLN
0.5000 ug/min | INTRAVENOUS | Status: DC
  Filled 2011-10-16: qty 4

## 2011-10-16 MED ORDER — MAGNESIUM SULFATE 50 % IJ SOLN
40.0000 meq | INTRAMUSCULAR | Status: DC
  Filled 2011-10-16: qty 10

## 2011-10-16 MED ORDER — VERAPAMIL HCL 2.5 MG/ML IV SOLN
INTRAVENOUS | Status: AC
  Administered 2011-10-17: 10:00:00
  Filled 2011-10-16: qty 2.5

## 2011-10-16 MED ORDER — SODIUM CHLORIDE 0.9 % IV SOLN
INTRAVENOUS | Status: AC
  Administered 2011-10-17: 70 mL/h via INTRAVENOUS
  Filled 2011-10-16: qty 40

## 2011-10-16 MED ORDER — NITROGLYCERIN IN D5W 200-5 MCG/ML-% IV SOLN
2.0000 ug/min | INTRAVENOUS | Status: AC
  Administered 2011-10-17: 5 ug/min via INTRAVENOUS
  Filled 2011-10-16 (×2): qty 250

## 2011-10-16 MED ORDER — SODIUM CHLORIDE 0.9 % IV SOLN
INTRAVENOUS | Status: AC
  Administered 2011-10-17: 1 [IU]/h via INTRAVENOUS
  Filled 2011-10-16: qty 1

## 2011-10-16 MED ORDER — VANCOMYCIN HCL 1000 MG IV SOLR
1500.0000 mg | INTRAVENOUS | Status: AC
  Administered 2011-10-17: 1500 mg via INTRAVENOUS
  Filled 2011-10-16: qty 1500

## 2011-10-16 MED ORDER — DEXTROSE 5 % IV SOLN
30.0000 ug/min | INTRAVENOUS | Status: AC
  Administered 2011-10-17: 15 ug/min via INTRAVENOUS
  Filled 2011-10-16: qty 2

## 2011-10-16 MED ORDER — SODIUM CHLORIDE 0.9 % IV SOLN
0.1000 ug/kg/h | INTRAVENOUS | Status: AC
  Administered 2011-10-17: .2 ug/kg/h via INTRAVENOUS
  Filled 2011-10-16: qty 4

## 2011-10-16 NOTE — Progress Notes (Signed)
   CARDIOTHORACIC SURGERY PROGRESS NOTE  4 Days Post-Op  S/P Procedure(s) (LRB): LEFT AND RIGHT HEART CATHETERIZATION WITH CORONARY ANGIOGRAM (N/A)  Subjective: Feels well  Objective: Vital signs in last 24 hours: Temp:  [98.5 F (36.9 C)-98.8 F (37.1 C)] 98.5 F (36.9 C) (02/17 0618) Pulse Rate:  [63-67] 67  (02/17 0618) Cardiac Rhythm:  [-] Heart block (02/16 2000) Resp:  [18-20] 18  (02/17 0618) BP: (120-125)/(72-76) 123/76 mmHg (02/17 0618) SpO2:  [96 %-98 %] 98 % (02/17 0618) Weight:  [101.6 kg (223 lb 15.8 oz)] 101.6 kg (223 lb 15.8 oz) (02/17 0618)  Physical Exam:  Rhythm:   sinus  Breath sounds: clear  Heart sounds:  RRR with murmur  Incisions:  n/a  Abdomen:  soft  Extremities:  warm   Intake/Output from previous day: 02/16 0701 - 02/17 0700 In: 1020 [P.O.:1020] Out: 775 [Urine:775] Intake/Output this shift: Total I/O In: 240 [P.O.:240] Out: -   Lab Results:  Basename 10/16/11 0515 10/15/11 0530  WBC 5.5 6.3  HGB 13.2 14.2  HCT 40.2 40.7  PLT 208 223   BMET:  Basename 10/16/11 0515 10/14/11 0600  NA 139 138  K 3.8 3.5  CL 101 100  CO2 29 29  GLUCOSE 96 87  BUN 24* 21  CREATININE 1.65* 1.72*  CALCIUM 9.3 9.3    CBG (last 3)   Basename 10/16/11 0615 10/15/11 2040 10/15/11 1604  GLUCAP 87 108* 173*   PT/INR:  No results found for this basename: LABPROT,INR in the last 72 hours  CXR:  N/A  Assessment/Plan: S/P Procedure(s) (LRB): LEFT AND RIGHT HEART CATHETERIZATION WITH CORONARY ANGIOGRAM (N/A)  Creatinine stable.  Lasix and ARB on hold.  Will recheck creatinine in am, but I expect that we will be able to proceed with surgery tomorrow.  I spent in excess of 20 minutes again reviewing the indications, risks and potential benefits of surgery with Mr Mccalla and his wife.  All questions answered.  Jasha Hodzic H 10/16/2011 11:45 AM

## 2011-10-16 NOTE — Progress Notes (Signed)
    Subjective:  Feels well. No chest pain or dyspnea.  Objective:  Vital Signs in the last 24 hours: Temp:  [98.5 F (36.9 C)-98.8 F (37.1 C)] 98.5 F (36.9 C) (02/17 0618) Pulse Rate:  [63-67] 67  (02/17 0618) Resp:  [18-20] 18  (02/17 0618) BP: (120-125)/(72-76) 123/76 mmHg (02/17 0618) SpO2:  [96 %-98 %] 98 % (02/17 0618) Weight:  [101.6 kg (223 lb 15.8 oz)] 101.6 kg (223 lb 15.8 oz) (02/17 0618)  Intake/Output from previous day: 02/16 0701 - 02/17 0700 In: 1020 [P.O.:1020] Out: 775 [Urine:775]  Physical Exam: Pt is alert and oriented, NAD HEENT: normal Neck: JVP - normal, carotids 2+= without bruits Lungs: CTA bilaterally CV: RRR with soft systolic murmur at the apex Abd: soft, NT, Positive BS, no hepatomegaly Ext: no C/C/E, distal pulses intact and equal Skin: warm/dry no rash   Lab Results:  Basename 10/16/11 0515 10/15/11 0530  WBC 5.5 6.3  HGB 13.2 14.2  PLT 208 223    Basename 10/16/11 0515 10/14/11 0600  NA 139 138  K 3.8 3.5  CL 101 100  CO2 29 29  GLUCOSE 96 87  BUN 24* 21  CREATININE 1.65* 1.72*   No results found for this basename: TROPONINI:2,CK,MB:2 in the last 72 hours  Tele: sinus rhythm with PVC's, no significant pauses or tachyarrhythmias.  Assessment/Plan:  1. Acute on chronic systolic heart failure  2. Multivessel CAD  3. Severe mitral regurgitation  4. CKD, stage 3  Pt looks good today. Creatinine is trending back down. Will continue to hold furosemide and will hold losartan today to reduce risk of ATN with cardiac surgery tomorrow. His volume status/BP are both optimal.  Tonny Bollman, M.D. 10/16/2011, 8:52 AM

## 2011-10-16 NOTE — Progress Notes (Signed)
Pt had episode of 2.07 pause. Pt asymptomatic no c/o of dizziness chest pain . Observed pt closely.

## 2011-10-17 ENCOUNTER — Encounter (HOSPITAL_COMMUNITY)
Admission: AD | Disposition: A | Discharge status: Home / Self Care | Payer: Self-pay | Source: Other Acute Inpatient Hospital | Attending: Thoracic Surgery (Cardiothoracic Vascular Surgery)

## 2011-10-17 ENCOUNTER — Encounter (HOSPITAL_COMMUNITY): Payer: Self-pay | Admitting: Thoracic Surgery (Cardiothoracic Vascular Surgery)

## 2011-10-17 ENCOUNTER — Encounter (HOSPITAL_COMMUNITY): Payer: Self-pay

## 2011-10-17 ENCOUNTER — Encounter (HOSPITAL_COMMUNITY): Payer: Self-pay | Admitting: Anesthesiology

## 2011-10-17 ENCOUNTER — Inpatient Hospital Stay (HOSPITAL_COMMUNITY): Payer: Medicare Other

## 2011-10-17 DIAGNOSIS — Z951 Presence of aortocoronary bypass graft: Secondary | ICD-10-CM

## 2011-10-17 DIAGNOSIS — I059 Rheumatic mitral valve disease, unspecified: Secondary | ICD-10-CM

## 2011-10-17 DIAGNOSIS — Z9889 Other specified postprocedural states: Secondary | ICD-10-CM

## 2011-10-17 DIAGNOSIS — I2510 Atherosclerotic heart disease of native coronary artery without angina pectoris: Secondary | ICD-10-CM

## 2011-10-17 HISTORY — PX: CORONARY ARTERY BYPASS GRAFT: SHX141

## 2011-10-17 HISTORY — PX: MITRAL VALVE REPAIR: SHX2039

## 2011-10-17 LAB — CBC
HCT: 40.4 % (ref 39.0–52.0)
Hemoglobin: 14 g/dL (ref 13.0–17.0)
MCH: 30.5 pg (ref 26.0–34.0)
MCH: 30.6 pg (ref 26.0–34.0)
Platelets: 120 10*3/uL — ABNORMAL LOW (ref 150–400)
Platelets: 126 10*3/uL — ABNORMAL LOW (ref 150–400)
RBC: 4.55 MIL/uL (ref 4.22–5.81)
RBC: 3.7 MIL/uL — ABNORMAL LOW (ref 4.22–5.81)
RBC: 3.46 MIL/uL — ABNORMAL LOW (ref 4.22–5.81)
RDW: 12.9 % (ref 11.5–15.5)
RDW: 12.9 % (ref 11.5–15.5)
RDW: 12.8 % (ref 11.5–15.5)
WBC: 5.7 10*3/uL (ref 4.0–10.5)
WBC: 13.1 10*3/uL — ABNORMAL HIGH (ref 4.0–10.5)
WBC: 12.4 10*3/uL — ABNORMAL HIGH (ref 4.0–10.5)

## 2011-10-17 LAB — POCT I-STAT 4, (NA,K, GLUC, HGB,HCT)
Glucose, Bld: 109 mg/dL — ABNORMAL HIGH (ref 70–99)
Glucose, Bld: 100 mg/dL — ABNORMAL HIGH (ref 70–99)
Glucose, Bld: 114 mg/dL — ABNORMAL HIGH (ref 70–99)
HCT: 41 % (ref 39.0–52.0)
HCT: 33 % — ABNORMAL LOW (ref 39.0–52.0)
HCT: 33 % — ABNORMAL LOW (ref 39.0–52.0)
HCT: 32 % — ABNORMAL LOW (ref 39.0–52.0)
HCT: 33 % — ABNORMAL LOW (ref 39.0–52.0)
Hemoglobin: 13.9 g/dL (ref 13.0–17.0)
Hemoglobin: 11.2 g/dL — ABNORMAL LOW (ref 13.0–17.0)
Hemoglobin: 11.2 g/dL — ABNORMAL LOW (ref 13.0–17.0)
Hemoglobin: 10.9 g/dL — ABNORMAL LOW (ref 13.0–17.0)
Hemoglobin: 11.2 g/dL — ABNORMAL LOW (ref 13.0–17.0)
Potassium: 3.8 mEq/L (ref 3.5–5.1)
Potassium: 4.1 mEq/L (ref 3.5–5.1)
Potassium: 3.5 mEq/L (ref 3.5–5.1)
Potassium: 4.5 mEq/L (ref 3.5–5.1)
Sodium: 140 mEq/L (ref 135–145)
Sodium: 139 mEq/L (ref 135–145)
Sodium: 140 mEq/L (ref 135–145)

## 2011-10-17 LAB — POCT I-STAT 3, ART BLOOD GAS (G3+)
Acid-Base Excess: 1 mmol/L (ref 0.0–2.0)
Acid-base deficit: 2 mmol/L (ref 0.0–2.0)
Acid-base deficit: 8 mmol/L — ABNORMAL HIGH (ref 0.0–2.0)
Acid-base deficit: 6 mmol/L — ABNORMAL HIGH (ref 0.0–2.0)
Bicarbonate: 25.2 mEq/L — ABNORMAL HIGH (ref 20.0–24.0)
Bicarbonate: 21.6 mEq/L (ref 20.0–24.0)
Bicarbonate: 22.4 mEq/L (ref 20.0–24.0)
Bicarbonate: 18 mEq/L — ABNORMAL LOW (ref 20.0–24.0)
Bicarbonate: 19.8 mEq/L — ABNORMAL LOW (ref 20.0–24.0)
O2 Saturation: 95 %
O2 Saturation: 96 %
Patient temperature: 36.5
Patient temperature: 36.9
TCO2: 21 mmol/L (ref 0–100)
pCO2 arterial: 40.4 mmHg (ref 35.0–45.0)
pCO2 arterial: 35 mmHg (ref 35.0–45.0)
pH, Arterial: 7.403 (ref 7.350–7.450)
pH, Arterial: 7.418 (ref 7.350–7.450)
pO2, Arterial: 500 mmHg — ABNORMAL HIGH (ref 80.0–100.0)
pO2, Arterial: 468 mmHg — ABNORMAL HIGH (ref 80.0–100.0)
pO2, Arterial: 248 mmHg — ABNORMAL HIGH (ref 80.0–100.0)
pO2, Arterial: 61 mmHg — ABNORMAL LOW (ref 80.0–100.0)
pO2, Arterial: 91 mmHg (ref 80.0–100.0)

## 2011-10-17 LAB — POCT I-STAT, CHEM 8
BUN: 21 mg/dL (ref 6–23)
Calcium, Ion: 1.11 mmol/L — ABNORMAL LOW (ref 1.12–1.32)
Chloride: 109 mEq/L (ref 96–112)
Creatinine, Ser: 1.4 mg/dL — ABNORMAL HIGH (ref 0.50–1.35)
Glucose, Bld: 142 mg/dL — ABNORMAL HIGH (ref 70–99)
HCT: 30 % — ABNORMAL LOW (ref 39.0–52.0)
Hemoglobin: 10.2 g/dL — ABNORMAL LOW (ref 13.0–17.0)
Potassium: 5.1 mEq/L (ref 3.5–5.1)
Sodium: 140 mEq/L (ref 135–145)
TCO2: 21 mmol/L (ref 0–100)

## 2011-10-17 LAB — GLUCOSE, CAPILLARY
Glucose-Capillary: 102 mg/dL — ABNORMAL HIGH (ref 70–99)
Glucose-Capillary: 89 mg/dL (ref 70–99)

## 2011-10-17 LAB — BASIC METABOLIC PANEL
BUN: 23 mg/dL (ref 6–23)
Chloride: 103 mEq/L (ref 96–112)
GFR calc Af Amer: 50 mL/min — ABNORMAL LOW (ref >90)
Potassium: 3.8 mEq/L (ref 3.5–5.1)

## 2011-10-17 LAB — MAGNESIUM: Magnesium: 2.9 mg/dL — ABNORMAL HIGH (ref 1.5–2.5)

## 2011-10-17 LAB — CREATININE, SERUM: GFR calc Af Amer: 60 mL/min — ABNORMAL LOW (ref >90)

## 2011-10-17 LAB — APTT: aPTT: 37 seconds (ref 24–37)

## 2011-10-17 LAB — PROTIME-INR: Prothrombin Time: 18.8 seconds — ABNORMAL HIGH (ref 11.6–15.2)

## 2011-10-17 LAB — SURGICAL PCR SCREEN: Staphylococcus aureus: NEGATIVE

## 2011-10-17 SURGERY — REPAIR, MITRAL VALVE
Anesthesia: General | Site: Chest | Wound class: Clean

## 2011-10-17 MED ORDER — POTASSIUM CHLORIDE 10 MEQ/50ML IV SOLN
10.0000 meq | INTRAVENOUS | Status: AC
  Administered 2011-10-17 (×2): 10 meq via INTRAVENOUS

## 2011-10-17 MED ORDER — POTASSIUM CHLORIDE 10 MEQ/50ML IV SOLN: 10.0000 meq | INTRAVENOUS | Status: DC

## 2011-10-17 MED ORDER — MIDAZOLAM HCL 5 MG/5ML IJ SOLN
INTRAMUSCULAR | Status: DC | PRN
  Administered 2011-10-17 (×2): 2 mg via INTRAVENOUS
  Administered 2011-10-17: 3 mg via INTRAVENOUS
  Administered 2011-10-17: 2 mg via INTRAVENOUS
  Administered 2011-10-17: 6 mg via INTRAVENOUS
  Administered 2011-10-17: 2 mg via INTRAVENOUS

## 2011-10-17 MED ORDER — ALBUMIN HUMAN 5 % IV SOLN
INTRAVENOUS | Status: DC | PRN
  Administered 2011-10-17 (×3): via INTRAVENOUS

## 2011-10-17 MED ORDER — VECURONIUM BROMIDE 10 MG IV SOLR
INTRAVENOUS | Status: DC | PRN
  Administered 2011-10-17: 5 mg via INTRAVENOUS
  Administered 2011-10-17: 3 mg via INTRAVENOUS
  Administered 2011-10-17: 5 mg via INTRAVENOUS
  Administered 2011-10-17: 6 mg via INTRAVENOUS
  Administered 2011-10-17: 2 mg via INTRAVENOUS
  Administered 2011-10-17: 5 mg via INTRAVENOUS
  Administered 2011-10-17: 4 mg via INTRAVENOUS

## 2011-10-17 MED ORDER — 0.9 % SODIUM CHLORIDE (POUR BTL) OPTIME
TOPICAL | Status: DC | PRN
  Administered 2011-10-17: 5000 mL

## 2011-10-17 MED ORDER — SODIUM CHLORIDE 0.9 % IJ SOLN: 3.0000 mL | INTRAMUSCULAR | Status: DC | PRN

## 2011-10-17 MED ORDER — ACETAMINOPHEN 160 MG/5ML PO SOLN: 650.0000 mg | ORAL | Status: AC

## 2011-10-17 MED ORDER — ALBUMIN HUMAN 5 % IV SOLN
250.0000 mL | INTRAVENOUS | Status: AC | PRN
  Administered 2011-10-17 – 2011-10-18 (×4): 250 mL via INTRAVENOUS
  Filled 2011-10-17 (×2): qty 250

## 2011-10-17 MED ORDER — 0.9 % SODIUM CHLORIDE (POUR BTL) OPTIME
TOPICAL | Status: DC | PRN
  Administered 2011-10-17: 1000 mL

## 2011-10-17 MED ORDER — BISACODYL 5 MG PO TBEC
10.0000 mg | DELAYED_RELEASE_TABLET | Freq: Every day | ORAL | Status: DC
  Administered 2011-10-18 – 2011-10-23 (×4): 10 mg via ORAL
  Filled 2011-10-17 (×6): qty 2

## 2011-10-17 MED ORDER — PROTAMINE SULFATE 10 MG/ML IV SOLN
INTRAVENOUS | Status: DC | PRN
  Administered 2011-10-17: 130 mg via INTRAVENOUS
  Administered 2011-10-17: 100 mg via INTRAVENOUS
  Administered 2011-10-17: 30 mg via INTRAVENOUS
  Administered 2011-10-17: 100 mg via INTRAVENOUS

## 2011-10-17 MED ORDER — CALCIUM CHLORIDE 10 % IV SOLN
1.0000 g | Freq: Once | INTRAVENOUS | Status: AC | PRN
  Filled 2011-10-17: qty 10

## 2011-10-17 MED ORDER — SODIUM CHLORIDE 0.9 % IV SOLN
0.1000 ug/kg/h | INTRAVENOUS | Status: DC
  Administered 2011-10-17: 0.6 ug/kg/h via INTRAVENOUS
  Filled 2011-10-17 (×2): qty 2

## 2011-10-17 MED ORDER — SODIUM CHLORIDE 0.45 % IV SOLN
INTRAVENOUS | Status: DC
  Administered 2011-10-17: 20 mL via INTRAVENOUS
  Administered 2011-10-17: 23:00:00 via INTRAVENOUS

## 2011-10-17 MED ORDER — FAMOTIDINE IN NACL 20-0.9 MG/50ML-% IV SOLN
20.0000 mg | Freq: Two times a day (BID) | INTRAVENOUS | Status: AC
  Administered 2011-10-17: 20 mg via INTRAVENOUS

## 2011-10-17 MED ORDER — METOPROLOL TARTRATE 12.5 MG HALF TABLET
12.5000 mg | ORAL_TABLET | Freq: Two times a day (BID) | ORAL | Status: DC
  Administered 2011-10-18: 12.5 mg via ORAL
  Filled 2011-10-17 (×7): qty 1

## 2011-10-17 MED ORDER — MAGNESIUM SULFATE 40 MG/ML IJ SOLN
4.0000 g | Freq: Once | INTRAMUSCULAR | Status: AC
  Administered 2011-10-17 (×2): 4 g via INTRAVENOUS
  Filled 2011-10-17: qty 100

## 2011-10-17 MED ORDER — OXYCODONE HCL 5 MG PO TABS
5.0000 mg | ORAL_TABLET | ORAL | Status: DC | PRN
  Administered 2011-10-18: 10 mg via ORAL
  Administered 2011-10-19: 5 mg via ORAL
  Filled 2011-10-17: qty 1
  Filled 2011-10-17: qty 2

## 2011-10-17 MED ORDER — MILRINONE IN DEXTROSE 200-5 MCG/ML-% IV SOLN
0.1250 ug/kg/min | INTRAVENOUS | Status: DC
  Filled 2011-10-17: qty 100

## 2011-10-17 MED ORDER — SODIUM CHLORIDE 0.9 % IR SOLN
Status: DC | PRN
  Administered 2011-10-17: 3000 mL

## 2011-10-17 MED ORDER — INSULIN ASPART 100 UNIT/ML ~~LOC~~ SOLN
0.0000 [IU] | SUBCUTANEOUS | Status: DC
  Administered 2011-10-17 – 2011-10-18 (×3): 2 [IU] via SUBCUTANEOUS
  Filled 2011-10-17: qty 3

## 2011-10-17 MED ORDER — SODIUM CHLORIDE 0.9 % IJ SOLN
3.0000 mL | Freq: Two times a day (BID) | INTRAMUSCULAR | Status: DC
  Administered 2011-10-18 (×2): 3 mL via INTRAVENOUS

## 2011-10-17 MED ORDER — MILRINONE IN DEXTROSE 200-5 MCG/ML-% IV SOLN
0.3000 ug/kg/min | INTRAVENOUS | Status: DC
  Administered 2011-10-17 – 2011-10-18 (×2): 0.3 ug/kg/min via INTRAVENOUS
  Filled 2011-10-17 (×3): qty 100

## 2011-10-17 MED ORDER — PANTOPRAZOLE SODIUM 40 MG PO TBEC: 40.0000 mg | DELAYED_RELEASE_TABLET | Freq: Every day | ORAL | Status: DC

## 2011-10-17 MED ORDER — LACTATED RINGERS IV SOLN
INTRAVENOUS | Status: DC | PRN
  Administered 2011-10-17 (×2): via INTRAVENOUS

## 2011-10-17 MED ORDER — SODIUM CHLORIDE 0.9 % IV SOLN: INTRAVENOUS | Status: DC

## 2011-10-17 MED ORDER — ROCURONIUM BROMIDE 100 MG/10ML IV SOLN
INTRAVENOUS | Status: DC | PRN
  Administered 2011-10-17: 50 mg via INTRAVENOUS

## 2011-10-17 MED ORDER — SODIUM BICARBONATE 8.4 % IV SOLN
INTRAVENOUS | Status: AC
  Administered 2011-10-17: 21:00:00
  Filled 2011-10-17: qty 50

## 2011-10-17 MED ORDER — HEMOSTATIC AGENTS (NO CHARGE) OPTIME
TOPICAL | Status: DC | PRN
  Administered 2011-10-17: 1 via TOPICAL

## 2011-10-17 MED ORDER — VANCOMYCIN HCL 1000 MG IV SOLR
1000.0000 mg | Freq: Once | INTRAVENOUS | Status: AC
  Administered 2011-10-17: 1000 mg via INTRAVENOUS
  Filled 2011-10-17 (×2): qty 1000

## 2011-10-17 MED ORDER — POTASSIUM CHLORIDE 10 MEQ/50ML IV SOLN
10.0000 meq | INTRAVENOUS | Status: AC
  Administered 2011-10-17 (×3): 10 meq via INTRAVENOUS

## 2011-10-17 MED ORDER — HEPARIN SODIUM (PORCINE) 1000 UNIT/ML IJ SOLN
INTRAMUSCULAR | Status: DC | PRN
  Administered 2011-10-17: 58000 [IU] via INTRAVENOUS

## 2011-10-17 MED ORDER — MILRINONE IN DEXTROSE 200-5 MCG/ML-% IV SOLN
INTRAVENOUS | Status: DC | PRN
  Administered 2011-10-17: .3 ug/kg/min via INTRAVENOUS

## 2011-10-17 MED ORDER — SODIUM CHLORIDE 0.9 % IJ SOLN
OROMUCOSAL | Status: DC | PRN
  Administered 2011-10-17: 10:00:00 via TOPICAL

## 2011-10-17 MED ORDER — DOCUSATE SODIUM 100 MG PO CAPS
200.0000 mg | ORAL_CAPSULE | Freq: Every day | ORAL | Status: DC
  Administered 2011-10-18: 200 mg via ORAL
  Filled 2011-10-17: qty 2

## 2011-10-17 MED ORDER — FENTANYL CITRATE 0.05 MG/ML IJ SOLN
INTRAMUSCULAR | Status: DC | PRN
  Administered 2011-10-17: 50 ug via INTRAVENOUS
  Administered 2011-10-17: 150 ug via INTRAVENOUS
  Administered 2011-10-17: 200 ug via INTRAVENOUS
  Administered 2011-10-17: 100 ug via INTRAVENOUS
  Administered 2011-10-17: 150 ug via INTRAVENOUS
  Administered 2011-10-17 (×5): 100 ug via INTRAVENOUS
  Administered 2011-10-17 (×3): 50 ug via INTRAVENOUS
  Administered 2011-10-17: 100 ug via INTRAVENOUS
  Administered 2011-10-17: 250 ug via INTRAVENOUS

## 2011-10-17 MED ORDER — PROPOFOL 10 MG/ML IV EMUL
INTRAVENOUS | Status: DC | PRN
  Administered 2011-10-17: 50 mg via INTRAVENOUS

## 2011-10-17 MED ORDER — MORPHINE SULFATE 4 MG/ML IJ SOLN
2.0000 mg | INTRAMUSCULAR | Status: DC | PRN
  Administered 2011-10-17 – 2011-10-18 (×3): 4 mg via INTRAVENOUS
  Filled 2011-10-17 (×3): qty 1

## 2011-10-17 MED ORDER — MORPHINE SULFATE 2 MG/ML IJ SOLN: 1.0000 mg | INTRAMUSCULAR | Status: AC | PRN

## 2011-10-17 MED ORDER — ACETAMINOPHEN 160 MG/5ML PO SOLN
975.0000 mg | Freq: Four times a day (QID) | ORAL | Status: AC
  Administered 2011-10-20: 975 mg
  Filled 2011-10-17 (×2): qty 40.6

## 2011-10-17 MED ORDER — PHENYLEPHRINE HCL 10 MG/ML IJ SOLN
0.0000 ug/min | INTRAVENOUS | Status: DC
  Administered 2011-10-17: 20 ug/min via INTRAVENOUS
  Administered 2011-10-18: 55 ug/min via INTRAVENOUS
  Filled 2011-10-17 (×3): qty 2

## 2011-10-17 MED ORDER — ACETAMINOPHEN 650 MG RE SUPP
650.0000 mg | RECTAL | Status: AC
  Administered 2011-10-17: 650 mg via RECTAL

## 2011-10-17 MED ORDER — SODIUM BICARBONATE 8.4 % IV SOLN
INTRAVENOUS | Status: AC
  Filled 2011-10-17: qty 50

## 2011-10-17 MED ORDER — MOXIFLOXACIN HCL IN NACL 400 MG/250ML IV SOLN
400.0000 mg | Freq: Once | INTRAVENOUS | Status: AC
  Administered 2011-10-17 – 2011-10-18 (×2): 400 mg via INTRAVENOUS
  Filled 2011-10-17: qty 250

## 2011-10-17 MED ORDER — METOPROLOL TARTRATE 25 MG/10 ML ORAL SUSPENSION
12.5000 mg | Freq: Two times a day (BID) | ORAL | Status: DC
  Filled 2011-10-17 (×7): qty 5

## 2011-10-17 MED ORDER — ONDANSETRON HCL 4 MG/2ML IJ SOLN: 4.0000 mg | Freq: Four times a day (QID) | INTRAMUSCULAR | Status: DC | PRN

## 2011-10-17 MED ORDER — SODIUM BICARBONATE 8.4 % IV SOLN
50.0000 meq | Freq: Once | INTRAVENOUS | Status: AC
  Administered 2011-10-17: 50 meq via INTRAVENOUS

## 2011-10-17 MED ORDER — NITROGLYCERIN IN D5W 200-5 MCG/ML-% IV SOLN: 0.0000 ug/min | INTRAVENOUS | Status: DC

## 2011-10-17 MED ORDER — DOPAMINE-DEXTROSE 3.2-5 MG/ML-% IV SOLN
0.0000 ug/kg/min | INTRAVENOUS | Status: DC
  Filled 2011-10-17: qty 250

## 2011-10-17 MED ORDER — ACETAMINOPHEN 500 MG PO TABS
1000.0000 mg | ORAL_TABLET | Freq: Four times a day (QID) | ORAL | Status: AC
  Administered 2011-10-18 – 2011-10-22 (×9): 1000 mg via ORAL
  Filled 2011-10-17 (×18): qty 2

## 2011-10-17 MED ORDER — MIDAZOLAM HCL 2 MG/2ML IJ SOLN: 2.0000 mg | INTRAMUSCULAR | Status: DC | PRN

## 2011-10-17 MED ORDER — LACTATED RINGERS IV SOLN
INTRAVENOUS | Status: DC
  Administered 2011-10-17 (×2): 20 mL via INTRAVENOUS

## 2011-10-17 MED ORDER — SODIUM CHLORIDE 0.9 % IV SOLN
INTRAVENOUS | Status: DC
  Filled 2011-10-17: qty 1

## 2011-10-17 MED ORDER — BISACODYL 10 MG RE SUPP: 10.0000 mg | Freq: Every day | RECTAL | Status: DC

## 2011-10-17 MED ORDER — ASPIRIN 81 MG PO CHEW: 324.0000 mg | CHEWABLE_TABLET | Freq: Every day | ORAL | Status: DC

## 2011-10-17 MED ORDER — SODIUM CHLORIDE 0.9 % IV SOLN: 250.0000 mL | INTRAVENOUS | Status: DC

## 2011-10-17 MED ORDER — SODIUM CHLORIDE 0.9 % IV SOLN
INTRAVENOUS | Status: DC | PRN
  Administered 2011-10-17: 13:00:00 via INTRAVENOUS

## 2011-10-17 MED ORDER — SODIUM BICARBONATE 8.4 % IV SOLN
25.0000 meq | Freq: Once | INTRAVENOUS | Status: AC
  Administered 2011-10-17: 25 meq via INTRAVENOUS

## 2011-10-17 MED ORDER — SODIUM CHLORIDE 0.9 % IV SOLN
5.0000 g | INTRAVENOUS | Status: DC
  Filled 2011-10-17: qty 20

## 2011-10-17 MED ORDER — METOPROLOL TARTRATE 1 MG/ML IV SOLN: 2.5000 mg | INTRAVENOUS | Status: DC | PRN

## 2011-10-17 MED ORDER — INSULIN REGULAR BOLUS VIA INFUSION
0.0000 [IU] | Freq: Three times a day (TID) | INTRAVENOUS | Status: DC
  Filled 2011-10-17: qty 10

## 2011-10-17 MED ORDER — DEXTROSE 5 % IV SOLN
1.5000 g | Freq: Two times a day (BID) | INTRAVENOUS | Status: DC
  Filled 2011-10-17 (×2): qty 1.5

## 2011-10-17 MED ORDER — HETASTARCH-ELECTROLYTES 6 % IV SOLN
INTRAVENOUS | Status: DC | PRN
  Administered 2011-10-17: 14:00:00 via INTRAVENOUS

## 2011-10-17 MED ORDER — ASPIRIN EC 325 MG PO TBEC
325.0000 mg | DELAYED_RELEASE_TABLET | Freq: Every day | ORAL | Status: DC
  Administered 2011-10-18: 325 mg via ORAL
  Filled 2011-10-17 (×2): qty 1

## 2011-10-17 SURGICAL SUPPLY — 178 items
ADAPTER CARDIO PERF ANTE/RETRO (ADAPTER) ×2 IMPLANT
ADH SKN CLS APL DERMABOND .7 (GAUZE/BANDAGES/DRESSINGS) ×1
ADPR PRFSN 84XANTGRD RTRGD (ADAPTER) ×1
APL SKNCLS STERI-STRIP NONHPOA (GAUZE/BANDAGES/DRESSINGS) ×1
APPLICATOR COTTON TIP 6IN STRL (MISCELLANEOUS) IMPLANT
APPLIER CLIP 9.375 MED OPEN (MISCELLANEOUS)
APPLIER CLIP 9.375 SM OPEN (CLIP)
APR CLP MED 9.3 20 MLT OPN (MISCELLANEOUS)
APR CLP SM 9.3 20 MLT OPN (CLIP)
ARTERIAL PRESSURE LINE (MISCELLANEOUS) ×2 IMPLANT
ATTRACTOMAT 16X20 MAGNETIC DRP (DRAPES) ×3 IMPLANT
BAG DECANTER FOR FLEXI CONT (MISCELLANEOUS) ×4 IMPLANT
BANDAGE ELASTIC 4 VELCRO ST LF (GAUZE/BANDAGES/DRESSINGS) ×2 IMPLANT
BANDAGE ELASTIC 6 VELCRO ST LF (GAUZE/BANDAGES/DRESSINGS) ×2 IMPLANT
BANDAGE GAUZE ELAST BULKY 4 IN (GAUZE/BANDAGES/DRESSINGS) ×2 IMPLANT
BASKET HEART (ORDER IN 25'S) (MISCELLANEOUS) ×1
BASKET HEART (ORDER IN 25S) (MISCELLANEOUS) ×1 IMPLANT
BENZOIN TINCTURE PRP APPL 2/3 (GAUZE/BANDAGES/DRESSINGS) ×2 IMPLANT
BLADE STERNUM SYSTEM 6 (BLADE) ×3 IMPLANT
BLADE SURG 11 STRL SS (BLADE) ×3 IMPLANT
BLADE SURG ROTATE 9660 (MISCELLANEOUS) ×1 IMPLANT
CANISTER SUCTION 2500CC (MISCELLANEOUS) ×4 IMPLANT
CANN PRFSN 3/8X14X24FR PCFC (MISCELLANEOUS)
CANN PRFSN 3/8XCNCT ST RT ANG (MISCELLANEOUS)
CANNULA GUNDRY RCSP 15FR (MISCELLANEOUS) IMPLANT
CANNULA PRFSN 3/8X14X24FR PCFC (MISCELLANEOUS) IMPLANT
CANNULA PRFSN 3/8XCNCT RT ANG (MISCELLANEOUS) IMPLANT
CANNULA VEN MTL TIP RT (MISCELLANEOUS)
CATH CPB KIT OWEN (MISCELLANEOUS) ×2 IMPLANT
CATH FOLEY 2WAY SLVR  5CC 14FR (CATHETERS)
CATH FOLEY 2WAY SLVR 5CC 14FR (CATHETERS) IMPLANT
CATH HEART VENT LEFT (CATHETERS) IMPLANT
CATH ROBINSON RED A/P 18FR (CATHETERS) ×5 IMPLANT
CATH THORACIC 28FR (CATHETERS) IMPLANT
CATH THORACIC 28FR RT ANG (CATHETERS) IMPLANT
CATH THORACIC 36FR (CATHETERS) ×2 IMPLANT
CATH THORACIC 36FR RT ANG (CATHETERS) ×1 IMPLANT
CLIP APPLIE 9.375 MED OPEN (MISCELLANEOUS) IMPLANT
CLIP APPLIE 9.375 SM OPEN (CLIP) IMPLANT
CLIP FOGARTY SPRING 6M (CLIP) IMPLANT
CLIP RETRACTION 3.0MM CORONARY (MISCELLANEOUS) ×1 IMPLANT
CLIP TI MEDIUM 24 (CLIP) IMPLANT
CLIP TI MEDIUM 6 (CLIP) IMPLANT
CLIP TI WIDE RED SMALL 24 (CLIP) IMPLANT
CLIP TI WIDE RED SMALL 6 (CLIP) IMPLANT
CLOTH BEACON ORANGE TIMEOUT ST (SAFETY) ×3 IMPLANT
CONN 1/2X1/2X1/2  BEN (MISCELLANEOUS) ×1
CONN 1/2X1/2X1/2 BEN (MISCELLANEOUS) ×1 IMPLANT
CONN 3/8X1/2 ST GISH (MISCELLANEOUS) ×4 IMPLANT
CONN ST 1/4X3/8  BEN (MISCELLANEOUS) ×1
CONN ST 1/4X3/8 BEN (MISCELLANEOUS) IMPLANT
CONN Y 3/8X3/8X3/8  BEN (MISCELLANEOUS)
CONN Y 3/8X3/8X3/8 BEN (MISCELLANEOUS) IMPLANT
CONNECTOR 1/2X3/8X1/2 3 WAY (MISCELLANEOUS) ×1
CONNECTOR 1/2X3/8X1/2 3WAY (MISCELLANEOUS) IMPLANT
COVER SURGICAL LIGHT HANDLE (MISCELLANEOUS) ×6 IMPLANT
CRADLE DONUT ADULT HEAD (MISCELLANEOUS) ×3 IMPLANT
DERMABOND ADVANCED (GAUZE/BANDAGES/DRESSINGS) ×1
DERMABOND ADVANCED .7 DNX12 (GAUZE/BANDAGES/DRESSINGS) IMPLANT
DRAIN CHANNEL 28F RND 3/8 FF (WOUND CARE) ×2 IMPLANT
DRAIN CHANNEL 32F RND 10.7 FF (WOUND CARE) ×2 IMPLANT
DRAPE CARDIOVASCULAR INCISE (DRAPES) ×2
DRAPE SLUSH/WARMER DISC (DRAPES) ×1 IMPLANT
DRAPE SRG 135X102X78XABS (DRAPES) ×1 IMPLANT
DRSG COVADERM 4X14 (GAUZE/BANDAGES/DRESSINGS) ×2 IMPLANT
ELECT BLADE 4.0 EZ CLEAN MEGAD (MISCELLANEOUS) ×2
ELECT REM PT RETURN 9FT ADLT (ELECTROSURGICAL) ×4
ELECTRODE BLDE 4.0 EZ CLN MEGD (MISCELLANEOUS) IMPLANT
ELECTRODE REM PT RTRN 9FT ADLT (ELECTROSURGICAL) ×4 IMPLANT
GLOVE BIO SURGEON STRL SZ 6 (GLOVE) IMPLANT
GLOVE BIO SURGEON STRL SZ 6.5 (GLOVE) ×2 IMPLANT
GLOVE BIO SURGEON STRL SZ7 (GLOVE) IMPLANT
GLOVE BIO SURGEON STRL SZ7.5 (GLOVE) ×2 IMPLANT
GLOVE BIO SURGEON STRL SZ8 (GLOVE) ×1 IMPLANT
GLOVE BIOGEL PI IND STRL 6 (GLOVE) IMPLANT
GLOVE BIOGEL PI IND STRL 6.5 (GLOVE) IMPLANT
GLOVE BIOGEL PI IND STRL 7.0 (GLOVE) IMPLANT
GLOVE BIOGEL PI INDICATOR 6 (GLOVE) ×6
GLOVE BIOGEL PI INDICATOR 6.5 (GLOVE)
GLOVE BIOGEL PI INDICATOR 7.0 (GLOVE)
GLOVE EUDERMIC 7 POWDERFREE (GLOVE) IMPLANT
GLOVE ORTHO TXT STRL SZ7.5 (GLOVE) ×5 IMPLANT
GOWN PREVENTION PLUS XLARGE (GOWN DISPOSABLE) ×4 IMPLANT
GOWN STRL NON-REIN LRG LVL3 (GOWN DISPOSABLE) ×12 IMPLANT
HEMOSTAT POWDER SURGIFOAM 1G (HEMOSTASIS) ×9 IMPLANT
INSERT FOGARTY 61MM (MISCELLANEOUS) IMPLANT
INSERT FOGARTY XLG (MISCELLANEOUS) ×6 IMPLANT
KIT BASIN OR (CUSTOM PROCEDURE TRAY) ×3 IMPLANT
KIT DILATOR VASC 18G NDL (KITS) ×1 IMPLANT
KIT PAIN CUSTOM (MISCELLANEOUS) IMPLANT
KIT ROOM TURNOVER OR (KITS) ×3 IMPLANT
KIT SUCTION CATH 14FR (SUCTIONS) ×4 IMPLANT
KIT VASOVIEW W/TROCAR VH 2000 (KITS) ×2 IMPLANT
LEAD PACING MYOCARDI (MISCELLANEOUS) ×2 IMPLANT
LINE VENT (MISCELLANEOUS) ×1 IMPLANT
LOOP VESSEL SUPERMAXI WHITE (MISCELLANEOUS) ×1 IMPLANT
MARKER GRAFT CORONARY BYPASS (MISCELLANEOUS) ×6 IMPLANT
NS IRRIG 1000ML POUR BTL (IV SOLUTION) ×15 IMPLANT
PACK OPEN HEART (CUSTOM PROCEDURE TRAY) ×3 IMPLANT
PAD ARMBOARD 7.5X6 YLW CONV (MISCELLANEOUS) ×7 IMPLANT
PENCIL BUTTON HOLSTER BLD 10FT (ELECTRODE) ×2 IMPLANT
PUNCH AORTIC ROTATE 4.0MM (MISCELLANEOUS) IMPLANT
PUNCH AORTIC ROTATE 4.5MM 8IN (MISCELLANEOUS) ×1 IMPLANT
PUNCH AORTIC ROTATE 5MM 8IN (MISCELLANEOUS) IMPLANT
RING MITRAL MEMO 3D 30MM SMD30 (Prosthesis & Implant Heart) ×1 IMPLANT
SET CARDIOPLEGIA MPS 5001102 (MISCELLANEOUS) ×1 IMPLANT
SET IRRIG TUBING LAPAROSCOPIC (IRRIGATION / IRRIGATOR) ×2 IMPLANT
SET Y EXTENSION LINE CSP (IV SETS) ×1 IMPLANT
SOLUTION ANTI FOG 6CC (MISCELLANEOUS) IMPLANT
SPONGE GAUZE 4X4 12PLY (GAUZE/BANDAGES/DRESSINGS) ×4 IMPLANT
SPONGE INTESTINAL PEANUT (DISPOSABLE) IMPLANT
SPONGE LAP 18X18 X RAY DECT (DISPOSABLE) ×2 IMPLANT
SPONGE LAP 4X18 X RAY DECT (DISPOSABLE) IMPLANT
STOPCOCK 4 WAY LG BORE MALE ST (IV SETS) IMPLANT
STRIP CLOSURE SKIN 1/2X4 (GAUZE/BANDAGES/DRESSINGS) ×2 IMPLANT
SUCKER INTRACARDIAC WEIGHTED (SUCKER) ×2 IMPLANT
SUT BONE WAX W31G (SUTURE) ×4 IMPLANT
SUT ETHIBOND (SUTURE) ×2 IMPLANT
SUT ETHIBOND 2 0 SH (SUTURE) ×5 IMPLANT
SUT ETHIBOND 2 0 SH 36X2 (SUTURE) ×2 IMPLANT
SUT ETHIBOND 2 0 V4 (SUTURE) IMPLANT
SUT ETHIBOND 2 0V4 GREEN (SUTURE) IMPLANT
SUT ETHIBOND 2-0 RB-1 WHT (SUTURE) ×2 IMPLANT
SUT ETHIBOND 4 0 TF (SUTURE) IMPLANT
SUT ETHIBOND 5 0 C 1 30 (SUTURE) ×2 IMPLANT
SUT ETHIBOND X763 2 0 SH 1 (SUTURE) ×5 IMPLANT
SUT MNCRL AB 3-0 PS2 18 (SUTURE) ×5 IMPLANT
SUT MNCRL AB 4-0 PS2 18 (SUTURE) IMPLANT
SUT PDS AB 1 CTX 36 (SUTURE) ×4 IMPLANT
SUT PROLENE 2 0 SH DA (SUTURE) IMPLANT
SUT PROLENE 3 0 SH 1 (SUTURE) ×2 IMPLANT
SUT PROLENE 3 0 SH DA (SUTURE) ×3 IMPLANT
SUT PROLENE 3 0 SH1 36 (SUTURE) IMPLANT
SUT PROLENE 4 0 RB 1 (SUTURE) ×4
SUT PROLENE 4 0 SH DA (SUTURE) ×5 IMPLANT
SUT PROLENE 4-0 RB1 .5 CRCL 36 (SUTURE) ×2 IMPLANT
SUT PROLENE 5 0 C 1 36 (SUTURE) IMPLANT
SUT PROLENE 6 0 C 1 30 (SUTURE) ×2 IMPLANT
SUT PROLENE 6 0 CC (SUTURE) IMPLANT
SUT PROLENE 7 0 BV 1 (SUTURE) IMPLANT
SUT PROLENE 7 0 BV1 MDA (SUTURE) IMPLANT
SUT PROLENE 7.0 RB 3 (SUTURE) ×8 IMPLANT
SUT PROLENE 8 0 BV175 6 (SUTURE) ×3 IMPLANT
SUT PROLENE BLUE 7 0 (SUTURE) ×2 IMPLANT
SUT PROLENE POLY MONO (SUTURE) ×2 IMPLANT
SUT SILK  1 MH (SUTURE) ×4
SUT SILK 1 MH (SUTURE) ×1 IMPLANT
SUT SILK 2 0 SH CR/8 (SUTURE) IMPLANT
SUT SILK 3 0 SH CR/8 (SUTURE) IMPLANT
SUT STEEL 6MS V (SUTURE) ×1 IMPLANT
SUT STEEL STERNAL CCS#1 18IN (SUTURE) ×1 IMPLANT
SUT STEEL SZ 6 DBL 3X14 BALL (SUTURE) ×3 IMPLANT
SUT VIC AB 1 CTX 36 (SUTURE)
SUT VIC AB 1 CTX36XBRD ANBCTR (SUTURE) IMPLANT
SUT VIC AB 2-0 CT1 27 (SUTURE) ×2
SUT VIC AB 2-0 CT1 TAPERPNT 27 (SUTURE) IMPLANT
SUT VIC AB 2-0 CTX 27 (SUTURE) IMPLANT
SUT VIC AB 2-0 UR6 27 (SUTURE) ×1 IMPLANT
SUT VIC AB 3-0 SH 27 (SUTURE)
SUT VIC AB 3-0 SH 27X BRD (SUTURE) IMPLANT
SUT VIC AB 3-0 X1 27 (SUTURE) IMPLANT
SUT VICRYL 4-0 PS2 18IN ABS (SUTURE) IMPLANT
SUTURE E-PAK OPEN HEART (SUTURE) ×2 IMPLANT
SYR 5ML LUER SLIP (SYRINGE) ×1 IMPLANT
SYRINGE 10CC LL (SYRINGE) ×1 IMPLANT
SYSTEM SAHARA CHEST DRAIN ATS (WOUND CARE) ×4 IMPLANT
TAPE CLOTH SURG 4X10 WHT LF (GAUZE/BANDAGES/DRESSINGS) ×2 IMPLANT
TOWEL OR 17X24 6PK STRL BLUE (TOWEL DISPOSABLE) ×4 IMPLANT
TOWEL OR 17X26 10 PK STRL BLUE (TOWEL DISPOSABLE) ×4 IMPLANT
TRANSDUCER COBE DISPOSABLE (MISCELLANEOUS) ×1 IMPLANT
TRAY CATH LUMEN 1 20CM STRL (SET/KITS/TRAYS/PACK) ×1 IMPLANT
TRAY FOLEY IC TEMP SENS 14FR (CATHETERS) ×2 IMPLANT
TUBE CONNECTING 12X1/4 (SUCTIONS) ×1 IMPLANT
TUBE SUCT INTRACARD DLP 20F (MISCELLANEOUS) ×2 IMPLANT
TUBING INSUFFLATION 10FT LAP (TUBING) ×3 IMPLANT
UNDERPAD 30X30 INCONTINENT (UNDERPADS AND DIAPERS) ×3 IMPLANT
VENT LEFT HEART 12002 (CATHETERS)
WATER STERILE IRR 1000ML POUR (IV SOLUTION) ×6 IMPLANT

## 2011-10-17 NOTE — Op Note (Signed)
Patient Name: Wayne Gordon MRN: [id number] Age: 78 y.o. Sex: male  HPI: The patient is a 78 y.o. male  with diabetes, hypertension, and hyperlipidemia, who presented with progressive shortness of breath and was found to have a recent non-ST segment elevation myocardial infarction. He was found to have severe mitral regurgitation and is now scheduled to undergo mitral valve repair and coronary artery bypass grafting by Dr. Tressie Stalker.  Indications: Transesophageal echocardiography was requested to evaluate the mitral valve and to assist with repair and to serve as a monitor for intraoperative volume status.  The patient was brought to the operating room. Anesthesia was induced without difficulty. The trachea was intubated without difficulty. Following orogastric suctioning, the transesophageal echo probe was inserted without difficulty    Impression:   PRE-BYPASS FINDINGS: 1. Aortic valve - the aortic valve is trileaflet the leaflets open normally and there was trace aortic insufficiency. The leaflets were not calcified and there was mild thickening of the leaflets.  2. Mitral valve - there was moderate mitral regurgitation. The mitral annulus and left ventricle were dilated. This resulted in lateral displacement of the papillary muscles with tethering of the cords and an apically displaced coaptation point. There was a central jet of mitral regurgitation which was diffuse along the coaptation line. The vena contract and measured 0.31 cm which was consistent with moderate mitral insufficiency. There were no flail segments and no prolapsing segments noted in was mild mitral annular calcification.  3. Left ventricle - the left ventricular cavity was markedly dilated and there was akinesis involving the basilar lateral inferior and inferior septal walls. There was diffuse hypokinesis in the other segments. Left ventricular ejection fraction was estimated at 20-25%. There was no thrombus noted in  the left ventricular apex.  4. Right ventricle - the right ventricle was of normal size and there appeared to be adequate contractility of the right ventricular free wall.  5. Tricuspid valve - the tricuspid valve appeared structurally intact with trace to 1+ tricuspid insufficiency.  6. Interatrial septum - the interatrial septum was intact without evidence of patent foramen ovale or atrial septal defect by color Doppler or bubble study.  7. Left atrium - the left atrial cavity was enlarged and measured 4.6 cm in the medial lateral to dimension and 6.7 cm in the superior inferior dimension. There was no thrombus noted in the left atrial cavity.  8. Left atrial appendage -the left atrial appendage was well visualized and there was no thrombus noted in it.  9. Ascending aorta  there was mild atheromatous disease of the ascending aorta. The aorta was non-aneurysmal and there was a well-defined sinotubular ridge and aortic root  10. Descending aorta - there was mild atheromatous disease noted in the descending aorta. The descending aorta measured 2 points 6 cm in diameter. 11. Pericardium - there was no pericardial effusion.  POST-BYPASS FINDINGS: Aortic valve - the leaflets were unchanged from the pre-bypass study and there was trace aortic insufficiency.  Mitral valve -there was an annuloplasty ring in the mitral position. The posterior leaflet was fixed and the anterior leaflet  was freely mobile. There was no residual mitral insufficiency noted. Continuous-wave Doppler interrogation of the mitral inflow revealed a 1 mm of mercury transmitral gradient. There was no evidence of systolic anterior motion of the mitral valve.  Left ventricle - the left ventricle was again markedly dilated with akinesis of the basilar lateral inferior and inferoseptal walls. There is diffuse hypokinesis in the other walls  and ejection fraction was estimated at 20-25%.  Right ventricle - right ventricle appeared to  be normal in size with good contractility the right ventricular free wall   Tricuspid valve - there was trace tricuspid insufficiency. Interatrial septum - No other significant changes from pre-bypass findings.  [name], MD 10/17/2011 8:31 PM

## 2011-10-17 NOTE — Progress Notes (Signed)
Patient ID: Wayne Gordon, male   DOB: 08/29/1934, 78 y.o.   MRN: [id number]                   [address]            [place], [address]          [phone]    BP 93/40  Pulse 80  Temp(Src) 97.7 F (36.5 C) (Core (Comment))  Resp 21  Ht 6\' 5"  (1.956 m)  Wt 223 lb (101.152 kg)  BMI 26.44 kg/m2  SpO2 99%  Still intubated, weaning vent On milrinone, dopamine and neo Not bleeding Neuro intact Extubated soon D/c femoral Benedict Needy MD  Beeper [phone] Office [phone] 10/17/2011 8:42 PM

## 2011-10-17 NOTE — Anesthesia Preprocedure Evaluation (Addendum)
Anesthesia Evaluation  Patient identified by MRN, date of birth, ID band Patient awake    Reviewed: Allergy & Precautions, H&P , NPO status , Patient's Chart, lab work & pertinent test results, reviewed documented beta blocker date and time   Airway Mallampati: II TM Distance: <3 FB Neck ROM: full    Dental  (+) Dental Advidsory Given and Edentulous Upper   Pulmonary shortness of breath and with exertion,  clear to auscultation        Cardiovascular hypertension, Pt. on home beta blockers + Past MI and +CHF + Valvular Problems/Murmurs MR Regular Normal- Systolic murmurs    Neuro/Psych    GI/Hepatic GERD-  Controlled,  Endo/Other  Diabetes mellitus-, Well Controlled, Type 2, Oral Hypoglycemic Agents  Renal/GU      Musculoskeletal   Abdominal   Peds  Hematology   Anesthesia Other Findings   Reproductive/Obstetrics                          Anesthesia Physical Anesthesia Plan  ASA: III  Anesthesia Plan: General   Post-op Pain Management:    Induction: Intravenous  Airway Management Planned: Oral ETT  Additional Equipment: Arterial line and PA Cath  Intra-op Plan:   Post-operative Plan: Post-operative intubation/ventilation  Informed Consent: I have reviewed the patients History and Physical, chart, labs and discussed the procedure including the risks, benefits and alternatives for the proposed anesthesia with the patient or authorized representative who has indicated his/her understanding and acceptance.   Dental Advisory Given and Dental advisory given  Plan Discussed with: Anesthesiologist, CRNA and Surgeon  Anesthesia Plan Comments: (Severe 3 V CAD with recent NONSTEMI Ischemic Cardiomyopathy EF 25% MR secondary to annular dilatation Type 2 DM Renal Insuff cr-1.52  Plan GA )     Anesthesia Quick Evaluation

## 2011-10-17 NOTE — Progress Notes (Signed)
Dr Gwendalyn Ege made aware of pt's having 17 beats of v-tach. Updated w/ vs 122/80 Hr=71. Pt asymptomatic no c/o chest pains weakness nor dizziness. Continue to monitor patient closely.

## 2011-10-17 NOTE — Addendum Note (Signed)
Addendum  created 10/17/11 2048 by [name], MD   Modules edited:Notes Section

## 2011-10-17 NOTE — Brief Op Note (Addendum)
                   [address]            [place] [address]          [phone]    10/10/2011 - 10/17/2011  12:46 PM  PATIENT:  Wayne Gordon  78 y.o. male  PRE-OPERATIVE DIAGNOSIS:  Coronary artery disease, mitral regurgitation  POST-OPERATIVE DIAGNOSIS:  Coronary artery disease, mitral regurgitation  PROCEDURE:  Procedure(s): MITRAL VALVE REPAIR (MVR)#30 MEMO 3D RING ANNYLOPLASTY CORONARY ARTERY BYPASS GRAFTING (CABG)X3(LIMA-LAD; SVG-PDA; SVG-RAMUS) EVH- RIGHT THIGH SURGEON:  Surgeon(s): Purcell Nails, MD  PHYSICIAN ASSISTANT: WAYNE GOLD PA-C  ANESTHESIA:   general  PATIENT CONDITION:  ICU - intubated and hemodynamically stable.  PRE-OPERATIVE WEIGHT: 152kg  COMPLICATIONS- NO KNOWN    Eulalah Rupert H 10/17/2011 2:12 PM

## 2011-10-17 NOTE — Progress Notes (Signed)
Rt femoral a-line removed per Dr. Tyrone Sage.  Catheter intact.  Pressure held for 10 minutes, pressure dressing applied.  Site WNL.  Will continue to monitor

## 2011-10-17 NOTE — Progress Notes (Signed)
  Echocardiogram Echocardiogram Transesophageal has been performed.  Wayne Gordon 10/17/2011, 9:08 AM

## 2011-10-17 NOTE — Progress Notes (Signed)
Pt refused to watch CABG video after encouraging him to watch. Incentive spirometer at bedside instructed how to use.

## 2011-10-17 NOTE — Op Note (Signed)
CARDIOTHORACIC SURGERY OPERATIVE NOTE  Date of Procedure:  10/17/2011  Preoperative Diagnosis:   Severe 3-vessel Coronary Artery Disease  Severe Mitral Regurgitation  Postoperative Diagnosis: Same  Procedure:    Coronary Artery Bypass Grafting x 3   Left Internal Mammary Artery to Distal Left Anterior Descending Coronary Artery  Saphenous Vein Graft to Posterior Descending Coronary Artery  Saphenous Vein Graft to Ramus Intermediate Branch Coronary Artery  Endoscopic Vein Harvest from Right Thigh and Lower Leg   Mitral Valve Repair  Sorin Memo 3D ring annuloplasty (size 30mm, Catalog #[id number], serial #[id number])    Surgeon: Salvatore Decent. 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 The patient has remained physically active during recent years and reports no physical problems until the last 2 weeks. Approximately 2 weeks ago the patient developed relatively sudden onset of severe exertional shortness of breath that he noted primarily when he was walking. He was stop and rest in the reading would improve. Symptoms accelerated fairly rapidly to the point where be he began to experience orthopnea and shortness of breath with very mild physical activity. He never had any chest  discomfort. He presented to [facility] in [place] where he was noted to be in congestive heart failure. Cardiac enzymes were abnormal and consistent with recent non-ST segment elevation myocardial infarction. An echocardiogram was performed demonstrating severe left ventricular dysfunction and moderate to severe mitral regurgitation. The patient was transferred to [facility] where he underwent left and right heart catheterization earlier today demonstrating severe three-vessel coronary artery disease with severe left ventricular dysfunction and moderate pulmonary hypertension. The patient was seen in consultation and provides full informed consent for the operation as described.    DETAILS OF THE OPERATIVE PROCEDURE  The patient is brought to the operating room on the above mentioned date and central monitoring was established by the anesthesia team including placement of Swan-Ganz catheter and radial arterial line. The patient is placed in the supine position on the operating table.  Intravenous antibiotics are administered. General endotracheal anesthesia is induced uneventfully. A Foley catheter is placed.  Baseline transesophageal echocardiogram was performed.  Findings were notable for dilated LV with severe LV dysfunction, EF < 25%, severe akinesis of inferior and posterolateral wall, moderate-severe mitral regurgitation (3+) with type IIIB mitral valve dysfunction.  The patient's chest, abdomen, both groins, and both lower extremities are prepared and draped in a sterile manner. A time out procedure is performed.  A median sternotomy incision was performed and the left internal mammary artery is dissected from the chest wall and prepared for bypass grafting. The left internal mammary artery is notably good quality conduit. Simultaneously, saphenous vein is obtained from the patient's right thigh using endoscopic vein harvest technique. The saphenous vein is notably fair to good quality  conduit. After removal of the saphenous vein, the small surgical incisions in the lower extremity are closed  with absorbable suture. Following systemic heparinization, the left internal mammary artery was transected distally noted to have excellent flow.  The pericardium is opened. The ascending aorta is normal in appearance. The right common femoral vein is cannulated using the Seldinger technique and a guidewire advanced into the right atrium using TEE guidance.  The patient is heparinized systemically and the right common femoral vein cannulated using a 22 Fr long femoral venous cannula.  The ascending aorta is cannulated for cardiopulmonary bypass.  Adequate heparinization is verified.   A retrograde cardioplegia cannula is placed through the right atrium into the coronary sinus.   The entire pre-bypass portion of the operation was notable for stable hemodynamics.  Cardiopulmonary bypass was begun and the surface of the heart is inspected.  A second venous cannula is placed directly into the superior vena cava.  Distal target vessels are selected for coronary artery bypass grafting.  A cardioplegia cannula is placed in the ascending aorta.  A temperature probe was placed in the interventricular septum.  The patient is cooled to 32C systemic temperature.  The aortic cross clamp is applied and cold blood cardioplegia is delivered initially in an antegrade fashion through the aortic root.   Supplemental cardioplegia is given retrograde through the coronary sinus catheter.  Iced saline slush is applied for topical hypothermia.  The initial cardioplegic arrest is rapid with early diastolic arrest.  Repeat doses of cardioplegia are administered intermittently throughout the entire cross clamp portion of the operation through the aortic root, through the coronary sinus catheter, and through subsequently placed vein grafts in order to maintain completely flat electrocardiogram and septal myocardial temperature  below 15C.  Myocardial protection was felt to be excellent.  The following distal coronary artery bypass grafts were performed:   The posterior descending branch of the right coronary artery was grafted using a reversed saphenous vein graft in an end-to-side fashion.  At the site of distal anastomosis the target vessel was fair to good quality and measured approximately 1.5 mm in diameter.  The ramus intermediate branch coronary artery was grafted using a reversed saphenous vein graft in an end-to-side fashion.  At the site of distal anastomosis the target vessel was good quality and measured approximately 2.0 mm in diameter.  The distal left anterior coronary artery was grafted with the left internal mammary artery in an end-to-side fashion.  At the site of distal anastomosis the target vessel was good quality and measured approximately 1.8 mm in diameter.  A left atriotomy incision was performed through the interatrial groove and extended partially across the back wall of the left atrium after opening the oblique sinus inferiorly.  The mitral valve is exposed using a self-retaining retractor.  The mitral valve was inspected and notable for normal leaflets and subvalvular apparatus .    Interrupted 2-0 Ethibond horizontal mattress sutures were placed circumferentially around the entire mitral annulus.  The valve was sized to accept between a 32 and 34 mm annuloplasty ring based upon the overall surface area of the anterior leaflet as well as the distance between the anterior and posterior commissures and the vertical height of the anterior leaflet.  This was downsized to 30 mm based upon the ischemic pathophysiology.  A Sorin Memo 3D annuloplasty ring (size 30mm, Catalog #[id number], serial [id number]) was implanted uneventfully.    After completion of the valve repair the valve was tested with saline and appeared to be perfectly competant.  There was a broad, symmetrical line of coaptation between the  anterior and posterior leaflets and no residual mitral regurgitation.  Rewarming was begun.  The atriotomy was closed using a 2-layer closure of running 3-0 Prolene suture after placing a sump drain across the mitral valve to serve as a left ventricular vent.  All proximal vein graft anastomoses were placed directly to the ascending aorta prior to removal of the aortic cross clamp.  The septal myocardial temperature rose rapidly after reperfusion of the left internal mammary artery graft.  One final dose of warm retrograde "hot shot" cardioplegia was administered retrograde through the coronary sinus catheter while all air was evacuated through the aortic root.  The aortic cross clamp was removed after a total cross clamp time of 128 minutes.  All proximal and distal coronary anastomoses were inspected for hemostasis and appropriate graft orientation.  Epicardial pacing wires are fixed to the right ventricular outflow tract and to the right atrial appendage. The patient is rewarmed to 37C temperature. The aortic and left ventricular vents are removed.  The patient is weaned and disconnected from cardiopulmonary bypass.  The patient's rhythm at separation from bypass was AV paced.  The patient was weaned from cardioplegic bypass on low dose dopamine and milrinone. Total cardiopulmonary bypass time for the operation was 157 minutes.  Followup transesophageal echocardiogram performed after separation from bypass revealed a well-seated mitral annuloplasty ring and a mitral valve that was functioning normally and without any residual mitral regurgitation.  Left ventricular function was unchanged from preoperatively.  The aortic and superior vena cava cannula were removed uneventfully. Protamine was administered to reverse the anticoagulation. The femoral venous cannula was removed and manual pressure held on the groin for 30 minutes.  The mediastinum and pleural space were inspected for hemostasis and irrigated  with saline solution. The mediastinum and both pleural spaces were drained using 4 chest tubes placed through separate stab incisions inferiorly.  The soft tissues anterior to the aorta were reapproximated loosely. The sternum is closed with double strength sternal wire. The soft tissues anterior to the sternum were closed in multiple layers and the skin is closed with a running subcuticular skin closure.  The patient tolerated the procedure well and is transported to the surgical intensive care in stable condition. There are no intraoperative complications. All sponge instrument and needle counts are verified correct at completion of the operation.   The post-bypass portion of the operation was notable for stable rhythm and hemodynamics.   No blood products were administered during the operation.    Salvatore Decent. Cornelius Moras MD 10/17/2011 2:30 PM

## 2011-10-17 NOTE — Transfer of Care (Signed)
Immediate Anesthesia Transfer of Care Note  Patient: Wayne Gordon  Procedure(s) Performed: Procedure(s) (LRB): MITRAL VALVE REPAIR (MVR) (N/A) CORONARY ARTERY BYPASS GRAFTING (CABG) (N/A)  Patient Location: SICU  Anesthesia Type: General  Level of Consciousness: sedated  Airway & Oxygen Therapy: Patient remains intubated per anesthesia plan  Post-op Assessment: Report given to PACU RN  Post vital signs: Reviewed and stable  Complications: No apparent anesthesia complications

## 2011-10-17 NOTE — Anesthesia Postprocedure Evaluation (Signed)
  Anesthesia Post-op Note  Patient: Wayne Gordon  Procedure(s) Performed: Procedure(s) (LRB): MITRAL VALVE REPAIR (MVR) (N/A) CORONARY ARTERY BYPASS GRAFTING (CABG) (N/A)  Patient Location: PACU and SICU  Anesthesia Type: General  Level of Consciousness: Patient remains intubated per anesthesia plan  Airway and Oxygen Therapy: Patient remains intubated per anesthesia plan  Post-op Pain: none  Post-op Assessment: Post-op Vital signs reviewed  Post-op Vital Signs: stable  Complications: No apparent anesthesia complications

## 2011-10-18 ENCOUNTER — Encounter (HOSPITAL_COMMUNITY): Payer: Self-pay

## 2011-10-18 ENCOUNTER — Inpatient Hospital Stay (HOSPITAL_COMMUNITY): Payer: Medicare Other

## 2011-10-18 ENCOUNTER — Encounter (HOSPITAL_COMMUNITY): Payer: Self-pay | Admitting: Thoracic Surgery (Cardiothoracic Vascular Surgery)

## 2011-10-18 DIAGNOSIS — I214 Non-ST elevation (NSTEMI) myocardial infarction: Secondary | ICD-10-CM

## 2011-10-18 LAB — CBC
HCT: 29.9 % — ABNORMAL LOW (ref 39.0–52.0)
Hemoglobin: 9.9 g/dL — ABNORMAL LOW (ref 13.0–17.0)
Hemoglobin: 10.2 g/dL — ABNORMAL LOW (ref 13.0–17.0)
MCHC: 34.1 g/dL (ref 30.0–36.0)
MCV: 89 fL (ref 78.0–100.0)
RBC: 3.25 MIL/uL — ABNORMAL LOW (ref 4.22–5.81)
RDW: 13.2 % (ref 11.5–15.5)

## 2011-10-18 LAB — POCT I-STAT, CHEM 8
Calcium, Ion: 1.1 mmol/L — ABNORMAL LOW (ref 1.12–1.32)
Creatinine, Ser: 1.8 mg/dL — ABNORMAL HIGH (ref 0.50–1.35)
Hemoglobin: 10.2 g/dL — ABNORMAL LOW (ref 13.0–17.0)
Sodium: 138 mEq/L (ref 135–145)
TCO2: 21 mmol/L (ref 0–100)

## 2011-10-18 LAB — GLUCOSE, CAPILLARY
Glucose-Capillary: 150 mg/dL — ABNORMAL HIGH (ref 70–99)
Glucose-Capillary: 143 mg/dL — ABNORMAL HIGH (ref 70–99)

## 2011-10-18 LAB — CREATININE, SERUM: GFR calc non Af Amer: 38 mL/min — ABNORMAL LOW (ref >90)

## 2011-10-18 LAB — BASIC METABOLIC PANEL
CO2: 21 mEq/L (ref 19–32)
GFR calc non Af Amer: 43 mL/min — ABNORMAL LOW (ref >90)
Glucose, Bld: 151 mg/dL — ABNORMAL HIGH (ref 70–99)
Potassium: 4.5 mEq/L (ref 3.5–5.1)
Sodium: 138 mEq/L (ref 135–145)

## 2011-10-18 LAB — MAGNESIUM: Magnesium: 2.6 mg/dL — ABNORMAL HIGH (ref 1.5–2.5)

## 2011-10-18 MED ORDER — COUMADIN BOOK
Freq: Once | Status: AC
  Administered 2011-10-18: 12:00:00
  Filled 2011-10-18: qty 1

## 2011-10-18 MED ORDER — WARFARIN SODIUM 2.5 MG PO TABS
2.5000 mg | ORAL_TABLET | Freq: Every day | ORAL | Status: DC
  Administered 2011-10-18 – 2011-10-21 (×4): 2.5 mg via ORAL
  Filled 2011-10-18 (×5): qty 1

## 2011-10-18 MED ORDER — WARFARIN VIDEO
Freq: Once | Status: AC
  Administered 2011-10-18: 18:00:00

## 2011-10-18 MED ORDER — INSULIN ASPART 100 UNIT/ML ~~LOC~~ SOLN
0.0000 [IU] | SUBCUTANEOUS | Status: DC
  Administered 2011-10-18 – 2011-10-19 (×4): 2 [IU] via SUBCUTANEOUS

## 2011-10-18 MED FILL — Magnesium Sulfate Inj 50%: INTRAMUSCULAR | Qty: 10 | Status: AC

## 2011-10-18 MED FILL — Nitroglycerin IV Soln 5 MG/ML: INTRAVENOUS | Qty: 10 | Status: AC

## 2011-10-18 MED FILL — Heparin Sodium (Porcine) Inj 1000 Unit/ML: INTRAMUSCULAR | Qty: 10 | Status: AC

## 2011-10-18 MED FILL — Lactated Ringer's Solution: INTRAVENOUS | Qty: 500 | Status: AC

## 2011-10-18 MED FILL — Verapamil HCl IV Soln 2.5 MG/ML: INTRAVENOUS | Qty: 4 | Status: AC

## 2011-10-18 MED FILL — Potassium Chloride Inj 2 mEq/ML: INTRAVENOUS | Qty: 40 | Status: AC

## 2011-10-18 NOTE — Addendum Note (Signed)
Addendum  created 10/18/11 1223 by Matilda Fleig F Ashara Lounsbury, CRNA   Modules edited:Anesthesia Medication Administration    

## 2011-10-18 NOTE — Progress Notes (Signed)
Patient ID: Wayne Gordon, male   DOB: Apr 27, 1934, 78 y.o.   MRN: [id number] POD #1 CABG x 3, mitral repair Resting comfortably BP 127/63  Pulse 85  Temp(Src) 98.5 F (36.9 C) (Oral)  Resp 21  Ht 6\' 5"  (1.956 m)  Wt 110.6 kg (243 lb 13.3 oz)  BMI 28.91 kg/m2  SpO2 98%  657/545  Cr= 1.8 up from 1.5 this AM On dopamine gttt  Continue current care

## 2011-10-18 NOTE — Addendum Note (Signed)
Addendum  created 10/18/11 1223 by Carmela Rima, CRNA   Modules edited:Anesthesia Medication Administration

## 2011-10-18 NOTE — Progress Notes (Signed)
   CARDIOTHORACIC SURGERY PROGRESS NOTE   R1 Day Post-Op Procedure(s) (LRB): MITRAL VALVE REPAIR (MVR) (N/A) CORONARY ARTERY BYPASS GRAFTING (CABG) (N/A)  Subjective: Looks good. Mild soreness in chest.  Breathing comfortably.  Objective: Vital signs: BP Readings from Last 1 Encounters:  10/18/11 90/44   Pulse Readings from Last 1 Encounters:  10/18/11 85   Resp Readings from Last 1 Encounters:  10/18/11 19   Temp Readings from Last 1 Encounters:  10/18/11 99.3 F (37.4 C)     Hemodynamics: PAP: (23-43)/(10-21) 28/13 mmHg CO:  [5.1 L/min-7.8 L/min] 7.8 L/min CI:  [2.2 L/min/m2-3.5 L/min/m2] 3.5 L/min/m2  Physical Exam:  Rhythm:   sinus  Breath sounds: Few scattered rhonchi  Heart sounds:  RRR no murmur  Incisions:  Dressings dry  Abdomen:  Soft, non distended, non tender  Extremities:  Warm, well perfused   Intake/Output from previous day: 02/18 0701 - 02/19 0700 In: 5413.8 [P.O.:480; I.V.:2583.8; Blood:500; IV Piggyback:1850] Out: 3625 [Urine:1455; Blood:1000; Chest Tube:1170] Intake/Output this shift:    Lab Results:  Basename 10/18/11 0400 10/17/11 2116 10/17/11 2100  WBC 12.1* -- 12.4*  HGB 9.9* 10.2* --  HCT 29.0* 30.0* --  PLT 120* -- 126*   BMET:  Basename 10/18/11 0400 10/17/11 2116 10/17/11 0500  NA 138 140 --  K 4.5 5.1 --  CL 107 109 --  CO2 21 -- 28  GLUCOSE 151* 142* --  BUN 23 21 --  CREATININE 1.51* 1.40* --  CALCIUM 8.0* -- 9.2    CBG (last 3)   Basename 10/18/11 0404 10/18/11 0007 10/17/11 1845  GLUCAP 151* 132* 103*   ABG    Component Value Date/Time   PHART 7.307* 10/17/2011 2219   HCO3 19.8* 10/17/2011 2219   TCO2 21 10/17/2011 2219   ACIDBASEDEF 6.0* 10/17/2011 2219   O2SAT 96.0 10/17/2011 2219   CXR: Looks good  Assessment/Plan: S/P Procedure(s) (LRB): MITRAL VALVE REPAIR (MVR) (N/A) CORONARY ARTERY BYPASS GRAFTING (CABG) (N/A)  Doing well POD1 Expected post op acute blood loss anemia, mild Expected post op  volume excess, mild Vasodilated on neo drip, milrinone, renal dose dopamine Baseline mild CRI   Mobilize  D/C mediastinal tubes and lines  Wean milrinone, neo drips  Hold diuretics until BP increased somewhat  Start low dose coumadin  Armoni Kludt H 10/18/2011 7:43 AM

## 2011-10-19 ENCOUNTER — Inpatient Hospital Stay (HOSPITAL_COMMUNITY): Payer: Medicare Other

## 2011-10-19 LAB — CBC
Hemoglobin: 10 g/dL — ABNORMAL LOW (ref 13.0–17.0)
MCH: 30.3 pg (ref 26.0–34.0)
MCHC: 34 g/dL (ref 30.0–36.0)
Platelets: 108 10*3/uL — ABNORMAL LOW (ref 150–400)

## 2011-10-19 LAB — BASIC METABOLIC PANEL
BUN: 30 mg/dL — ABNORMAL HIGH (ref 6–23)
Calcium: 8 mg/dL — ABNORMAL LOW (ref 8.4–10.5)
GFR calc Af Amer: 55 mL/min — ABNORMAL LOW (ref >90)
GFR calc non Af Amer: 47 mL/min — ABNORMAL LOW (ref >90)
Glucose, Bld: 129 mg/dL — ABNORMAL HIGH (ref 70–99)
Potassium: 4.6 mEq/L (ref 3.5–5.1)
Sodium: 136 mEq/L (ref 135–145)

## 2011-10-19 LAB — GLUCOSE, CAPILLARY
Glucose-Capillary: 120 mg/dL — ABNORMAL HIGH (ref 70–99)
Glucose-Capillary: 161 mg/dL — ABNORMAL HIGH (ref 70–99)

## 2011-10-19 LAB — PROTIME-INR
INR: 1.3 (ref 0.00–1.49)
Prothrombin Time: 16.4 seconds — ABNORMAL HIGH (ref 11.6–15.2)

## 2011-10-19 MED ORDER — PANTOPRAZOLE SODIUM 40 MG PO TBEC
40.0000 mg | DELAYED_RELEASE_TABLET | Freq: Every day | ORAL | Status: DC
  Administered 2011-10-20 – 2011-10-24 (×5): 40 mg via ORAL
  Filled 2011-10-19 (×5): qty 1

## 2011-10-19 MED ORDER — POTASSIUM CHLORIDE CRYS ER 20 MEQ PO TBCR
20.0000 meq | EXTENDED_RELEASE_TABLET | Freq: Two times a day (BID) | ORAL | Status: DC
  Administered 2011-10-19: 10 meq via ORAL
  Administered 2011-10-19: 20 meq via ORAL
  Filled 2011-10-19 (×4): qty 1

## 2011-10-19 MED ORDER — ASPIRIN EC 81 MG PO TBEC
81.0000 mg | DELAYED_RELEASE_TABLET | Freq: Every day | ORAL | Status: DC
  Administered 2011-10-19 – 2011-10-24 (×6): 81 mg via ORAL
  Filled 2011-10-19 (×6): qty 1

## 2011-10-19 MED ORDER — NIACIN ER (ANTIHYPERLIPIDEMIC) 500 MG PO TBCR: 1000.0000 mg | EXTENDED_RELEASE_TABLET | Freq: Every day | ORAL | Status: DC

## 2011-10-19 MED ORDER — MOVING RIGHT ALONG BOOK
Freq: Once | Status: AC
  Administered 2011-10-20: 16:00:00
  Filled 2011-10-19: qty 1

## 2011-10-19 MED ORDER — DOCUSATE SODIUM 100 MG PO CAPS
200.0000 mg | ORAL_CAPSULE | Freq: Every day | ORAL | Status: DC
  Administered 2011-10-19 – 2011-10-24 (×4): 200 mg via ORAL
  Filled 2011-10-19 (×5): qty 2

## 2011-10-19 MED ORDER — MAGNESIUM HYDROXIDE 400 MG/5ML PO SUSP: 30.0000 mL | Freq: Every day | ORAL | Status: DC | PRN

## 2011-10-19 MED ORDER — INSULIN ASPART 100 UNIT/ML ~~LOC~~ SOLN: 0.0000 [IU] | Freq: Every day | SUBCUTANEOUS | Status: DC

## 2011-10-19 MED ORDER — SODIUM CHLORIDE 0.9 % IV SOLN: 250.0000 mL | INTRAVENOUS | Status: DC | PRN

## 2011-10-19 MED ORDER — NIACIN ER 500 MG PO CPCR
1000.0000 mg | ORAL_CAPSULE | Freq: Every day | ORAL | Status: DC
  Administered 2011-10-19 – 2011-10-23 (×5): 1000 mg via ORAL
  Filled 2011-10-19 (×6): qty 2

## 2011-10-19 MED ORDER — FUROSEMIDE 40 MG PO TABS
40.0000 mg | ORAL_TABLET | Freq: Two times a day (BID) | ORAL | Status: DC
  Administered 2011-10-19 – 2011-10-20 (×4): 40 mg via ORAL
  Filled 2011-10-19 (×6): qty 1

## 2011-10-19 MED ORDER — SODIUM CHLORIDE 0.9 % IJ SOLN: 3.0000 mL | INTRAMUSCULAR | Status: DC | PRN

## 2011-10-19 MED ORDER — FUROSEMIDE 10 MG/ML IJ SOLN
20.0000 mg | Freq: Once | INTRAMUSCULAR | Status: AC
  Administered 2011-10-19: 20 mg via INTRAVENOUS
  Filled 2011-10-19: qty 2

## 2011-10-19 MED ORDER — ROSUVASTATIN CALCIUM 20 MG PO TABS
20.0000 mg | ORAL_TABLET | Freq: Every evening | ORAL | Status: DC
  Administered 2011-10-19 – 2011-10-23 (×5): 20 mg via ORAL
  Filled 2011-10-19 (×6): qty 1

## 2011-10-19 MED ORDER — SODIUM CHLORIDE 0.9 % IJ SOLN
3.0000 mL | Freq: Two times a day (BID) | INTRAMUSCULAR | Status: DC
  Administered 2011-10-19 – 2011-10-23 (×10): 3 mL via INTRAVENOUS

## 2011-10-19 MED ORDER — INSULIN ASPART 100 UNIT/ML ~~LOC~~ SOLN
0.0000 [IU] | Freq: Three times a day (TID) | SUBCUTANEOUS | Status: DC
  Administered 2011-10-19: 3 [IU] via SUBCUTANEOUS
  Administered 2011-10-19: 2 [IU] via SUBCUTANEOUS
  Administered 2011-10-19: 3 [IU] via SUBCUTANEOUS
  Administered 2011-10-20: 2 [IU] via SUBCUTANEOUS
  Administered 2011-10-20: 3 [IU] via SUBCUTANEOUS
  Administered 2011-10-20 – 2011-10-22 (×5): 2 [IU] via SUBCUTANEOUS
  Administered 2011-10-22 – 2011-10-23 (×2): 3 [IU] via SUBCUTANEOUS
  Administered 2011-10-24: 2 [IU] via SUBCUTANEOUS

## 2011-10-19 NOTE — Progress Notes (Signed)
The patient is resting comfortably this evening. Sinus rhythm with good O2 saturation No complaints, stable day

## 2011-10-20 ENCOUNTER — Inpatient Hospital Stay (HOSPITAL_COMMUNITY): Payer: Medicare Other

## 2011-10-20 LAB — PROTIME-INR
INR: 1.36 (ref 0.00–1.49)
Prothrombin Time: 17 seconds — ABNORMAL HIGH (ref 11.6–15.2)

## 2011-10-20 LAB — CBC
Hemoglobin: 10.3 g/dL — ABNORMAL LOW (ref 13.0–17.0)
MCHC: 34.2 g/dL (ref 30.0–36.0)
RBC: 3.41 MIL/uL — ABNORMAL LOW (ref 4.22–5.81)
WBC: 12.2 10*3/uL — ABNORMAL HIGH (ref 4.0–10.5)

## 2011-10-20 LAB — GLUCOSE, CAPILLARY
Glucose-Capillary: 146 mg/dL — ABNORMAL HIGH (ref 70–99)
Glucose-Capillary: 133 mg/dL — ABNORMAL HIGH (ref 70–99)
Glucose-Capillary: 153 mg/dL — ABNORMAL HIGH (ref 70–99)

## 2011-10-20 LAB — BASIC METABOLIC PANEL
GFR calc non Af Amer: 34 mL/min — ABNORMAL LOW (ref >90)
Glucose, Bld: 176 mg/dL — ABNORMAL HIGH (ref 70–99)
Potassium: 4.4 mEq/L (ref 3.5–5.1)
Sodium: 132 mEq/L — ABNORMAL LOW (ref 135–145)

## 2011-10-20 MED ORDER — PHENOL 1.4 % MT LIQD
1.0000 | OROMUCOSAL | Status: DC | PRN
  Filled 2011-10-20: qty 177

## 2011-10-20 MED ORDER — POTASSIUM CHLORIDE 20 MEQ/15ML (10%) PO LIQD
20.0000 meq | Freq: Two times a day (BID) | ORAL | Status: DC
  Administered 2011-10-20 – 2011-10-21 (×4): 20 meq via ORAL
  Filled 2011-10-20 (×6): qty 15

## 2011-10-20 MED ORDER — ENOXAPARIN SODIUM 30 MG/0.3ML ~~LOC~~ SOLN
30.0000 mg | SUBCUTANEOUS | Status: AC
  Administered 2011-10-20 – 2011-10-21 (×2): 30 mg via SUBCUTANEOUS
  Filled 2011-10-20 (×2): qty 0.3

## 2011-10-20 NOTE — Progress Notes (Signed)
   CARDIOTHORACIC SURGERY PROGRESS NOTE   R3 Days Post-Op Procedure(s) (LRB): MITRAL VALVE REPAIR (MVR) (N/A) CORONARY ARTERY BYPASS GRAFTING (CABG) (N/A)  Subjective: Feels well.  Speech and swallowing improved, tolerating liquids fine, but still having trouble swallowing pills.  Objective: Vital signs: BP Readings from Last 1 Encounters:  10/20/11 117/70   Pulse Readings from Last 1 Encounters:  10/20/11 81   Resp Readings from Last 1 Encounters:  10/20/11 24   Temp Readings from Last 1 Encounters:  10/20/11 97.4 F (36.3 C) Oral    Hemodynamics:    Physical Exam:  Rhythm:   Atrial fibrillation with HR 50's, currently VVI paced  Breath sounds: clear  Heart sounds:  RRR, no murmur  Incisions:  Clean and dry  Abdomen:  Soft, non distended, non tender  Extremities:  Warm, well perfused   Intake/Output from previous day: 02/20 0701 - 02/21 0700 In: 1437.2 [P.O.:1320; I.V.:115.2; IV Piggyback:2] Out: 750 [Urine:750] Intake/Output this shift:    Lab Results:  Basename 10/20/11 0440 10/19/11 0350  WBC 12.2* 13.5*  HGB 10.3* 10.0*  HCT 30.1* 29.4*  PLT 115* 108*   BMET:  Basename 10/20/11 0440 10/19/11 0350  NA 132* 136  K 4.4 4.6  CL 102 105  CO2 24 23  GLUCOSE 176* 129*  BUN 37* 30*  CREATININE 1.85* 1.41*  CALCIUM 8.2* 8.0*    CBG (last 3)   Basename 10/20/11 0808 10/19/11 2123 10/19/11 1800  GLUCAP 146* 161* 160*   ABG    Component Value Date/Time   PHART 7.307* 10/17/2011 2219   HCO3 19.8* 10/17/2011 2219   TCO2 21 10/18/2011 1638   ACIDBASEDEF 6.0* 10/17/2011 2219   O2SAT 96.0 10/17/2011 2219   CXR: Clear  Assessment/Plan: S/P Procedure(s) (LRB): MITRAL VALVE REPAIR (MVR) (N/A) CORONARY ARTERY BYPASS GRAFTING (CABG) (N/A)  Stable POD3, still waiting for bed on step down for transfer Postop atrial fibrillation with HR 50's, currently VVI paced Expected post op acute blood loss anemia, mild, stable Expected post op volume excess,  mild, with adequate diuresis Acute on chronic renal insufficiency Severe ischemic cardiomyopathy   Continue VVI pacing  D/C metoprolol  Continue coumadin and add low dose lovenox  Watch renal function  Tiernan Millikin H 10/20/2011 8:18 AM

## 2011-10-21 LAB — CBC
HCT: 31.2 % — ABNORMAL LOW (ref 39.0–52.0)
Hemoglobin: 10.8 g/dL — ABNORMAL LOW (ref 13.0–17.0)
MCH: 30.5 pg (ref 26.0–34.0)
MCHC: 34.6 g/dL (ref 30.0–36.0)
MCV: 88.1 fL (ref 78.0–100.0)

## 2011-10-21 LAB — BASIC METABOLIC PANEL
BUN: 42 mg/dL — ABNORMAL HIGH (ref 6–23)
Creatinine, Ser: 2.2 mg/dL — ABNORMAL HIGH (ref 0.50–1.35)
GFR calc non Af Amer: 28 mL/min — ABNORMAL LOW (ref >90)
Glucose, Bld: 149 mg/dL — ABNORMAL HIGH (ref 70–99)
Potassium: 4.3 mEq/L (ref 3.5–5.1)

## 2011-10-21 LAB — GLUCOSE, CAPILLARY
Glucose-Capillary: 150 mg/dL — ABNORMAL HIGH (ref 70–99)
Glucose-Capillary: 113 mg/dL — ABNORMAL HIGH (ref 70–99)

## 2011-10-21 LAB — PROTIME-INR: INR: 1.65 — ABNORMAL HIGH (ref 0.00–1.49)

## 2011-10-21 MED ORDER — WARFARIN SODIUM 2.5 MG PO TABS: 2.5000 mg | ORAL_TABLET | Freq: Every day | ORAL | Status: DC

## 2011-10-21 MED ORDER — TRAMADOL HCL 50 MG PO TABS: 50.0000 mg | ORAL_TABLET | Freq: Four times a day (QID) | ORAL | Status: AC | PRN

## 2011-10-21 MED ORDER — ENSURE CLINICAL ST REVIGOR PO LIQD
237.0000 mL | Freq: Every day | ORAL | Status: DC
  Administered 2011-10-22 – 2011-10-23 (×2): 237 mL via ORAL

## 2011-10-21 MED FILL — Sodium Chloride IV Soln 0.9%: INTRAVENOUS | Qty: 1000 | Status: AC

## 2011-10-21 MED FILL — Heparin Sodium (Porcine) Inj 1000 Unit/ML: INTRAMUSCULAR | Qty: 30 | Status: AC

## 2011-10-21 MED FILL — Sodium Chloride Irrigation Soln 0.9%: Qty: 3000 | Status: AC

## 2011-10-21 MED FILL — Lidocaine HCl IV Inj 20 MG/ML: INTRAVENOUS | Qty: 5 | Status: AC

## 2011-10-21 MED FILL — Sodium Bicarbonate IV Soln 8.4%: INTRAVENOUS | Qty: 50 | Status: AC

## 2011-10-21 MED FILL — Electrolyte-R (PH 7.4) Solution: INTRAVENOUS | Qty: 7000 | Status: AC

## 2011-10-21 MED FILL — Albumin, Human Inj 5%: INTRAVENOUS | Qty: 250 | Status: AC

## 2011-10-21 MED FILL — Heparin Sodium (Porcine) Inj 1000 Unit/ML: INTRAMUSCULAR | Qty: 1 | Status: AC

## 2011-10-21 MED FILL — Mannitol IV Soln 20%: INTRAVENOUS | Qty: 500 | Status: AC

## 2011-10-21 NOTE — Discharge Instructions (Signed)
Activity: 1.May walk up steps                2.No lifting more than ten pounds for four weeks.                 3.No driving for four weeks.                4.Stop any activity that causes chest pain, shortness of breath, dizziness, sweating or excessive weakness.                5.Avoid straining.                6.Continue with your breathing exercises daily.  Diet: Diabetic diet and Low fat, Low salt diet  Wound Care: May shower.  Clean wounds with mild soap and water daily. Contact the office at [phone] if any problems arise. Coronary Artery Bypass Grafting Care After Refer to this sheet in the next few weeks. These instructions provide you with information on caring for yourself after your procedure. Your caregiver may also give you more specific instructions. Your treatment has been planned according to current medical practices, but problems sometimes occur. Call your caregiver if you have any problems or questions after your procedure.  Recovery from open heart surgery will be different for everyone. Some people feel well after 3 or 4 weeks, while for others it takes longer. After heart surgery, it may be normal to:  Not have an appetite, feel nauseated by the smell of food, or only want to eat a small amount.   Be constipated because of changes in your diet, activity, and medicines. Eat foods high in fiber. Add fresh fruits and vegetables to your diet. Stool softeners may be helpful.   Feel sad or unhappy. You may be frustrated or cranky. You may have good days and bad days. Do not give up. Talk to your caregiver if you do not feel better.   Feel weakness and fatigue. You many need physical therapy or cardiac rehabilitation to get your strength back.   Develop an irregular heartbeat called atrial fibrillation. Symptoms of atrial fibrillation are a fast, irregular heartbeat or feelings of fluttery heartbeats, shortness of breath, low blood pressure, and dizziness. If these symptoms  develop, see your caregiver right away.  MEDICATION  Have a list of all the medicines you will be taking when you leave the hospital. For every medicine, know the following:   Name.   Exact dose.   Time of day to be taken.   How often it should be taken.   Why you are taking it.   Ask which medicines should or should not be taken together. If you take more than one heart medicine, ask if it is okay to take them together. Some heart medicines should not be taken at the same time because they may lower your blood pressure too much.   Narcotic pain medicine can cause constipation. Eat fresh fruits and vegetables. Add fiber to your diet. Stool softener medicine may help relieve constipation.   Keep a copy of your medicines with you at all times.   Do not add or stop taking any medicine until you check with your caregiver.   Medicines can have side effects. Call your caregiver who prescribed the medicine if you:   Start throwing up, have diarrhea, or have stomach pain.   Feel dizzy or lightheaded when you stand up.   Feel your heart is skipping beats or is beating   too fast or too slow.   Develop a rash.   Notice unusual bruising or bleeding.  HOME CARE INSTRUCTIONS  After heart surgery, it is important to learn how to take your pulse. Have your caregiver show you how to take your pulse.   Use your incentive spirometer. Ask your caregiver how long after surgery you need to use it.  Care of your chest incision  Tell your caregiver right away if you notice clicking in your chest (sternum).   Support your chest with a pillow or your arms when you take deep breaths and cough.   Follow your caregiver's instructions about when you can bathe or swim.   Protect your incision from sunlight during the first year to keep the scar from getting dark.   Tell your caregiver if you notice:   Increased tenderness of your incision.   Increased redness or swelling around your incision.    Drainage or pus from your incision.  Care of your leg incision(s)  Avoid crossing your legs.   Avoid sitting for long periods of time. Change positions every half hour.   Elevate your leg(s) when you are sitting.   Check your leg(s) daily for swelling. Check the incisions for redness or drainage.   Wear your elastic stockings as told by your caregiver. Take them off at bedtime.  Diet  Diet is very important to heart health.   Eat plenty of fresh fruits and vegetables. Meats should be lean cut. Avoid canned, processed, and fried foods.   Talk to a dietician. They can teach you how to make healthy food and drink choices.  Weight  Weigh yourself every day. This is important because it helps to know if you are retaining fluid that may make your heart and lungs work harder.   Use the same scale each time.   Weigh yourself every morning at the same time. You should do this after you go to the bathroom, but before you eat breakfast.   Your weight will be more accurate if you do not wear any clothes.   Record your weight.   Tell your caregiver if you have gained 2 pounds or more overnight.  Activity Stop any activity at once if you have chest pain, shortness of breath, irregular heartbeats, or dizziness. Get help right away if you have any of these symptoms.  Bathing.  Avoid soaking in a bath or hot tub until your incisions are healed.   Rest. You need a balance of rest and activity.   Exercise. Exercise per your caregiver's advice. You may need physical therapy or cardiac rehabilitation to help strengthen your muscles and build your endurance.   Climbing stairs. Unless your caregiver tells you not to climb stairs, go up stairs slowly and rest if you tire. Do not pull yourself up by the handrail.   Driving a car. Follow your caregiver's advice on when you may drive. You may ride as a passenger at any time. When traveling for long periods of time in a car, get out of the car and  walk around for a few minutes every 2 hours.   Lifting. Avoid lifting, pushing, or pulling anything heavier than 10 pounds for 6 weeks after surgery or as told by your caregiver.   Returning to work. Check with your caregiver. People heal at different rates. Most people will be able to go back to work 6 to 12 weeks after surgery.   Sexual activity. You may resume sexual relations as told   by your caregiver.  SEEK MEDICAL CARE IF:  Any of your incisions are red, painful, or have any type of drainage coming from them.   You have an oral temperature above 102 F (38.9 C).   You have ankle or leg swelling.   You have pain in your legs.   You have weight gain of 2 or more pounds a day.   You feel dizzy or lightheaded when you stand up.  SEEK IMMEDIATE MEDICAL CARE IF:  You have angina or chest pain that goes to your jaw or arms. Call your local emergency services right away.   You have shortness of breath at rest or with activity.   You have a fast or irregular heartbeat (arrhythmia).   There is a "clicking" in your sternum when you move.   You have numbness or weakness in your arms or legs.  MAKE SURE YOU:  Understand these instructions.   Will watch your condition.   Will get help right away if you are not doing well or get worse.  Document Released: 03/04/2005 Document Revised: 04/27/2011 Document Reviewed: 10/20/2010 [employer] Patient Information 2012 [employer].    

## 2011-10-21 NOTE — Progress Notes (Signed)
   CARE MANAGEMENT NOTE 10/21/2011  Patient:  Wayne Gordon, Wayne Gordon   Account Number:  [id number]  Date Initiated:  10/18/2011  Documentation initiated by:  [facility]  Subjective/Objective Assessment:   Admitted with CHF - 10-17-11 MVR and CABG x3.  Lives with spouse.     Action/Plan:   PTA, PT INDEPENDENT, LIVES WITH SPOUSE.   Anticipated DC Date:  10/21/2011   Anticipated DC Plan:  HOME W HOME HEALTH SERVICES      DC Planning Services  CM consult      Choice offered to / List presented to:     DME arranged  Levan Hurst      DME agency  [facility]        Status of service:  In process, will continue to follow Medicare Important Message given?   (If response is "NO", the following Medicare IM given date fields will be blank) Date Medicare IM given:   Date Additional Medicare IM given:    Discharge Disposition:    Per UR Regulation:  Reviewed for med. necessity/level of care/duration of stay  Comments:  10/21/11 Tyrika Newman,RN,BSN MET WITH PT AND WIFE TO DISCUSS DC PLANS.  SPOUSE TO PROVIDE 24HR CARE AT HOME.  REQUESTS RW FOR HOME.  REFERRAL TO JUSTIN WITH [facility]. Phone #[phone]

## 2011-10-21 NOTE — Progress Notes (Signed)
INITIAL ADULT NUTRITION ASSESSMENT Date: 10/21/2011   Time: 1:04 PM Reason for Assessment: Poor PO  ASSESSMENT: Male 78 y.o.  Dx: S/P mitral valve repair and CABG  Past Medical History  Diagnosis Date  . Hypertension   . High cholesterol   . Heart murmur   . Shortness of breath     "lying down & w/exertion"  . Diabetes mellitus   . GERD (gastroesophageal reflux disease)   . Arthritis   . Congestive heart failure   . Mitral regurgitation   . S/P mitral valve repair 10/17/2011  . 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 acetaminophen  1,000 mg Oral Q6H   Or  . acetaminophen (TYLENOL) oral liquid 160 mg/5 mL  975 mg Per Tube Q6H  . aspirin EC  81 mg Oral Daily  . bisacodyl  10 mg Oral Daily   Or  . bisacodyl  10 mg Rectal Daily  . docusate sodium  200 mg Oral Daily  . enoxaparin (LOVENOX) injection  30 mg Subcutaneous Q24H  . insulin aspart  0-15 Units Subcutaneous TID WC  . insulin aspart  0-5 Units Subcutaneous QHS  . moving right along book   Does not apply Once  . niacin  1,000 mg Oral QHS  . pantoprazole  40 mg Oral QAC breakfast  . potassium chloride  20 mEq Oral BID  . rosuvastatin  20 mg Oral QPM  . sodium chloride  3 mL Intravenous Q12H  . warfarin  2.5 mg Oral q1800  . DISCONTD: furosemide  40 mg Oral BID   Continuous Infusions:  PRN Meds:.sodium chloride, magnesium hydroxide, ondansetron (ZOFRAN) IV, oxyCODONE, phenol, sodium chloride   Ht: 6\' 5"  (195.6 cm)  Wt: 242 lb 6.4 oz (109.952 kg)  Ideal Wt: 94.5 kg  % Ideal Wt: 116%  Usual Wt: 225 lb, per pt % Usual Wt: 107%  Body mass index is 28.74 kg/(m^2).  Food/Nutrition Related Hx: Noted swallowing improvement. Pt states he does not have much of an appetite and does not care for the food. Pt's wife talked about bringing in foods for pt so I provided her with a list of recommended foods and foods to avoid on Dysphagia 3 diet. Pt also interested in trying Ensure supplement beverage.  Labs:    CMP     Component Value Date/Time   NA 138 10/21/2011 0647   K 4.3 10/21/2011 0647   CL 105 10/21/2011 0647   CO2 22 10/21/2011 0647   GLUCOSE 149* 10/21/2011 0647   BUN 42* 10/21/2011 0647   CREATININE 2.20* 10/21/2011 0647   CALCIUM 8.6 10/21/2011 0647   PROT 6.6 10/13/2011 0650   ALBUMIN 3.8 10/13/2011 0650   AST 17 10/13/2011 0650   ALT 21 10/13/2011 0650   ALKPHOS 41 10/13/2011 0650   BILITOT 0.7 10/13/2011 0650   GFRNONAA 28* 10/21/2011 0647   GFRAA 32* 10/21/2011 0647    CBG (last 3)   Basename 10/21/11 1216 10/21/11 0622 10/20/11 2141  GLUCAP 150* 134* 139*    Lab Results  Component Value Date   HGBA1C 6.2* 10/13/2011   Lab Results  Component Value Date   CREATININE 2.20* 10/21/2011    Intake/Output Summary (Last 24 hours) at 10/21/11 1304 Last data filed at 10/21/11 1000  Gross per 24 hour  Intake    720 ml  Output    950 ml  Net   -230 ml    Diet Order: Dysphagia 3, thin  liquids (25% recent PO intake documented)  Supplements/Tube Feeding: none at this time  IVF:    Estimated Nutritional Needs:   Kcal: 2500-2700 Protein: 105-115 gm Fluid: >3.2 L  NUTRITION DIAGNOSIS: -Inadequate oral intake (NI-2.1).  Status: Ongoing  RELATED TO: decreased appetite, swallowing difficulty  AS EVIDENCE BY: 25% PO intake and pt comments  MONITORING/EVALUATION(Goals): Goal: Pt to consume >75% meals and supplements. Monitor: PO intake, diet advancement, weight changes  EDUCATION NEEDS: -Education needs addressed  INTERVENTION: Encourage PO intake as appropriate for diet order. Will order chocolate Ensure Clinical Strength daily with dinner, per pt preference.   DOCUMENTATION CODES Per approved criteria  -Not Applicable    Wayne Gordon 10/21/2011, 1:04 PM

## 2011-10-21 NOTE — Progress Notes (Signed)
CARDIAC REHAB PHASE I   PRE:  Rate/Rhythm: 65  BP:  Supine:   Sitting: 108/68  Standing:    SaO2:   MODE:  Ambulation: 150 ft   POST:  Rate/Rhythem: 78  BP:  Supine:  Sitting: 118/60  Standing:    SaO2:   Wayne Gordon  Pt ambulated 150 ft assist x 1 with rolling walker. Pt tolerated amb well, no complaints. No rest breaks or reported symptoms. Returned to bedside. VSS. Discharge education completed with spouse in attendance. Covered procedure, sternal precautions, incisional care, signs & symptoms, when to call MD and 911, diet, home exercise, and phase II cardiac rehab. Pt interested in phase II. Will send referral and follow up. Electronically signed by Harriett Sine MS on Friday October 21 2011 at 1114 EST

## 2011-10-21 NOTE — Progress Notes (Signed)
   CARDIOTHORACIC SURGERY PROGRESS NOTE  4 Days Post-Op  S/P Procedure(s) (LRB): MITRAL VALVE REPAIR (MVR) (N/A) CORONARY ARTERY BYPASS GRAFTING (CABG) (N/A)  Subjective: Feels better.  Swallowing improved.  Eating some. + BM.  No SOB.    Objective: Vital signs in last 24 hours: Temp:  [97.9 F (36.6 C)-98.7 F (37.1 C)] 97.9 F (36.6 C) (02/22 0421) Pulse Rate:  [70-73] 70  (02/22 0421) Cardiac Rhythm:  [-] Ventricular paced (02/22 0845) Resp:  [18-25] 20  (02/22 0421) BP: (94-114)/(45-65) 111/65 mmHg (02/22 0421) SpO2:  [96 %-99 %] 96 % (02/22 0421) Weight:  [109.952 kg (242 lb 6.4 oz)] 109.952 kg (242 lb 6.4 oz) (02/22 0421)  Physical Exam:  Rhythm:   Afib with HR 60's  Breath sounds: Diminished right base, otherwise clear  Heart sounds:  Irreg, no murmur  Incisions:  Clean and dry  Abdomen:  Soft, non tender  Extremities:  Warm, well perfused   Intake/Output from previous day: 02/21 0701 - 02/22 0700 In: 630 [P.O.:630] Out: 400 [Urine:400] Intake/Output this shift:    Lab Results:  Basename 10/21/11 0647 10/20/11 0440  WBC 14.2* 12.2*  HGB 10.8* 10.3*  HCT 31.2* 30.1*  PLT 173 115*   BMET:  Basename 10/21/11 0647 10/20/11 0440  NA 138 132*  K 4.3 4.4  CL 105 102  CO2 22 24  GLUCOSE 149* 176*  BUN 42* 37*  CREATININE 2.20* 1.85*  CALCIUM 8.6 8.2*    CBG (last 3)   Basename 10/21/11 0622 10/20/11 2141 10/20/11 1640  GLUCAP 134* 139* 153*   PT/INR:   Basename 10/21/11 0647  LABPROT 19.8*  INR 1.65*    CXR:  N/A  Assessment/Plan: S/P Procedure(s) (LRB): MITRAL VALVE REPAIR (MVR) (N/A) CORONARY ARTERY BYPASS GRAFTING (CABG) (N/A)  Stable POD4 Severe ischemic cardiomyopathy, EF < 25% Postop atrial fibrillation, HR improved since lopressor stopped Expected post op acute blood loss anemia, mild, stable Expected post op volume excess, mild Acute on chronic renal failure, creatinine up a little further today Right lower lobe atelectasis,  postop, mildly elevated right hemidiaphragm DMII with adequate glycemic control   Mobilize, pulm toilet  Hold lasix today and watch renal function, encourage po's  Continue coumadin  D/C lovenox when INR > 1.8  Recheck CXR in am  Jasir Rother H 10/21/2011 8:53 AM

## 2011-10-21 NOTE — Discharge Summary (Signed)
I agree with the above discharge summary and plan for follow-up.  [name]  

## 2011-10-21 NOTE — Progress Notes (Signed)
Patient ambulated 335ft will rolling walker on room air. Patient tolerated walk well. Nickolas Madrid

## 2011-10-21 NOTE — Progress Notes (Signed)
Pt ambulated approximately 300 ft with RW and RN and tolerated well.  Steady on his feet, no stops for rest, felt well.  Left resting comfortably in bed with call bell within reach.  Will continue to monitor.  Wayne Gordon

## 2011-10-21 NOTE — Discharge Summary (Signed)
Physician Discharge Summary  Patient ID: Wayne Gordon MRN: [id number] DOB/AGE: [birth year] 78 y.o.  Admit date: 10/10/2011 Discharge date: 10/22/2011  Admission Diagnoses: 1. S/p NSTEMI 2.Multivessel CAD 3.Severe mitral regurgitation 4.Acute on chronic systolic heart failure 5.History of DM 6.History of hypertension 7.History of hyperlipidemia 8.Renal insufficiency 9.History of low testosterone  Discharge Diagnoses:  1. S/p NSTEMI 2.Multivessel CAD 3.Severe mitral regurgitation 4.Acute on chronic systolic heart failure 5.History of DM 6.History of hypertension 7.History of hyperlipidemia 8.Renal insufficiency 9.History of low testosterone 10.Thrombocytopenia (resolved prior to discharge) 11.Mild ABL anemia 12.Left carotid artery stenosis 60-79%  Procedure (s):  1.Cardiac catheterization done by Dr. Kirke Corin on 10/12/2011: Coronary angiography:  Coronary dominance: Codominant.  Left Main: Normal in size and mildly calcified. Minor irregularities without evidence of obstructive disease.  Left Anterior Descending (LAD): Normal in size and moderately calcified. It wraps around the apex distally. There is a 40% proximal lesion close to the ostium. There is a 70-80% eccentric stenosis in the midsegment before the second diagonal. In the midsegment after the second diagonal there is another 60% stenosis. After that, there is mild diffuse disease.  1st diagonal (D1): Very small in size.  2nd diagonal (D2): Normal in size with minor irregularities.  3rd diagonal (D3): Small in size and free of significant disease.  Circumflex (LCx): Normal in size and codominant. It has moderate calcifications. There is a 30% proximal stenosis at OM1. The rest of the vessel has minor irregularities.  1st obtuse marginal: Appears to be normal in size and occluded proximally. It has collaterals from the left anterior descending artery.  2nd obtuse marginal: Small in size.  3rd obtuse marginal: Medium in  size with mild 30% stenosis proximally.  The posterior AV groove artery is normal in size with minor irregularities. It gives to a medium size posterolateral branches.  Right Coronary Artery: The vessel is medium in size and heavily calcified. There is 40% proximal disease. The vessel is occluded in the midsegment after an RV branch with extensive bridging collaterals.  posterior descending artery: Normal in size and fills via collaterals. It appears to be a good target for CABG. Left ventriculography: It was done with manual injection due to chronic kidney disease. Left ventricular systolic function is severely reduced , LVEF is estimated at 25% %, could not evaluate the severity of mitral regurgitation  2.Coronary Artery Bypass Grafting x 3  Left Internal Mammary Artery to Distal Left Anterior Descending Coronary Artery  Saphenous Vein Graft to Posterior Descending Coronary Artery  Saphenous Vein Graft to Ramus Intermediate Branch Coronary Artery  Endoscopic Vein Harvest from Right Thigh and Lower Leg Mitral Valve Repair  Wayne Gordon 3D ring annuloplasty (size 30mm, Catalog #[id number], serial [id number]) by Dr. Cornelius Moras on 10/17/2011.  History of Presenting Illness: This is a 78 year old Caucasian male who initially presented to [facility] emergency room on 10/08/2011 with complaints of persistent shortness of breath. To medical records, approximately 5 days prior to his admission there he was taking his usual walk when he developed dyspnea on exertion upon going up a hill that never happened before. He continued to  notice dyspnea upon exertion throughout the week. He also had episodes where he felt like his heart was beating more rapidly than normal and increasing orthopnea. He denied any lower extremity edema, chest pain or pressure. He had no prior history of coronary artery disease, angina, or congestive heart failure. Chest x-ray done showed chronic interstitial course pain with no pneumonia. His BNP  was found  to be elevated at 491 and he also had a slight elevation of his troponin I 0.06. As aggressively diuresed for heart failure clinically. Because of his elevated troponin, there was a concern for coronary artery disease. Artery gram was done on 10/10/2011. His EF was found to be 25-30% , global hypokinesis of the left ventricle with mild or regional variation of a left atrium moderately to severely dilated, mild AI and TR, moderate to severe MR, and trace pulmonic regurgitation. Patient was then transferred to [facility] in order to undergo cardiac catheterization as well as further evaluation and treatment. He underwent a  cardiac catheterization by Dr. Kirke Corin on 10/12/2011. He was found to have multivessel coronary artery disease, severity of the mitral regurgitation was unable to be evaluated, and a left ventricular ejection fraction estimated 25%. A cardiothoracic consultation obtained Dr. Cornelius Moras for consideration of coronary artery bypass grafting surgery as well as mitral valve repair versus replacement. Potential risks, complications, and benefits of the surgery were discussed with the patient and he agreed to proceed. Preoperative carotid duplex carotid ultrasound revealed no evidence of significant internal carotid artery stenosis on the right an approximate 60-79% stenosis of the left internal carotid artery.He underwent a CABG x3 and mitral valve repair on 10/17/2011.  Brief Hospital Course:  Patient was extubated successfully evening of surgery. He remained afebrile and hemodynamically stable. His A-line, Swan-Ganz, chest tubes, and Foley were all removed early in his postoperative course. Is initially VVI paced in as a result his beta blocker was held. He was weaned off dopamine. He was started on Coumadin and his PT/INR were monitored daily. His creatinine at the time of admission to [facility] was approximately 1.4 -1.5;however,  creatinine went as high as 2.2 following surgery.  He was also  found to have acute blood loss anemia. His H&H was low as 9.9 and 29. His last H&H was up to 10 and 31.2. He had mild thrombocytopenia postoperatively; however, his last platelet count resolution of this with a platelet count 173,000. He did have a slight increase in his white blood count cell count today from 12,000  To 14,200. He remains afebrile has no signs of wound infection. Another CBC withdrawn the morning. Been tolerating a diet has had a bowel movement. Epicardial pacing wires and chest tube sutures will be removed in the morning. Provided he remains afebrile, hemodynamic a stable, and pending morning round evaluation, he was stable for discharge on 10/22/2011.  Filed Vitals:   10/21/11 1347  BP: 110/65  Pulse: 65  Temp: 98.7 F (37.1 C)  Resp: 21     Latest Vital Signs: Blood pressure 110/65, pulse 65, temperature 98.7 F (37.1 C), temperature source Oral, resp. rate 21, height 6\' 5"  (1.956 m), weight 242 lb 6.4 oz (109.952 kg), SpO2 97.00%.  Physical Exam: Rhythm: Afib with HR 60's  Breath sounds: Diminished right base, otherwise clear  Heart sounds: Irreg, no murmur  Incisions: Clean and dry  Abdomen: Soft, non tender  Extremities: Warm, well perfused    Discharge Condition:Stable  Recent laboratory studies:  Lab Results  Component Value Date   WBC 14.2* 10/21/2011   HGB 10.8* 10/21/2011   HCT 31.2* 10/21/2011   MCV 88.1 10/21/2011   PLT 173 10/21/2011   Lab Results  Component Value Date   NA 138 10/21/2011   K 4.3 10/21/2011   CL 105 10/21/2011   CO2 22 10/21/2011   CREATININE 2.20* 10/21/2011   GLUCOSE 149* 10/21/2011  Diagnostic Studies: Dg Chest 2 View  10/20/2011  *RADIOLOGY REPORT*  Clinical Data: Post CABG, chest soreness  CHEST - 2 VIEW  Comparison: Portable chest x-ray of 10/19/2011  Findings: The right chest tube has been removed.  There has been an increase in right basilar opacity consistent with atelectasis and possibly small right effusion.  No  pneumothorax is seen. Cardiomegaly is stable with minimal linear atelectasis medially at the left lung base.  The venous sheath has been removed from the SVC, and a left chest tube has been removed as well.  IMPRESSION:  1.  Removal of right and left chest tubes.  No pneumothorax. 2.  Increase in right basilar opacity consistent with atelectasis and possibly small right effusion  Original Report Authenticated By: Juline Patch, M.D.    Discharge Orders    Future Appointments: Provider: Department: Dept Phone: Center:   11/14/2011 1:30 PM Purcell Nails, MD [facility] [phone] [facility]      Discharge Medications: Medication List  As of 10/21/2011  2:45 PM   STOP taking these medications         hydrochlorothiazide 25 MG tablet      losartan 100 MG tablet      vitamin E 400 UNIT capsule         TAKE these medications         aspirin 81 MG chewable tablet   Chew 81 mg by mouth daily.      CALCIUM-MAGNESIUM-ZINC PO   Take 2 tablets by mouth daily.      metFORMIN 850 MG tablet   Commonly known as: GLUCOPHAGE   Take 850 mg by mouth 2 (two) times daily with a meal.      multivitamins ther. w/minerals Tabs   Take 1 tablet by mouth daily.      niacin 1000 MG CR tablet   Commonly known as: NIASPAN   Take 1,000 mg by mouth at bedtime.      omega-3 acid ethyl esters 1 G capsule   Commonly known as: LOVAZA   Take 2 g by mouth daily.      omeprazole 20 MG capsule   Commonly known as: PRILOSEC   Take 20 mg by mouth daily.      rosuvastatin 20 MG tablet   Commonly known as: CRESTOR   Take 20 mg by mouth every evening.      testosterone cypionate 200 MG/ML injection   Commonly known as: DEPOTESTOTERONE CYPIONATE   Inject 200 mg into the muscle every 14 (fourteen) days.      traMADol 50 MG tablet   Commonly known as: ULTRAM   Take 1 tablet (50 mg total) by mouth every 6 (six) hours as needed for pain.      warfarin 2.5 MG tablet   Commonly known as: COUMADIN   Take 1  tablet (2.5 mg total) by mouth daily at 6 PM. Or as directed by [facility] cardiology.            Follow Up Appointments: Follow-up Information    Follow up with Lorine Bears, MD. (Call for a follow up appointment for 2 weeks)    Contact information:   [address] [place] [place] [address] [phone]       Follow up with Purcell Nails, MD. (PA/LAT CXR to be taken at on 11/14/2011 at 12:30pm; Appointment with Dr. Cornelius Moras is on 11/14/2011 at 1:30 pm)    Contact information:   [address] [employer]  [address] [place] [address] [phone]       Follow up with Lorine Bears, MD. (Call for an appointment time to have a PT/INR drawn on Monday 10/24/2011.)    Contact information:   [address] [employer] [place] [address] [phone]          Signed: Doree Fudge MPA-C 10/21/2011, 2:45 PM

## 2011-10-22 ENCOUNTER — Other Ambulatory Visit: Payer: Self-pay

## 2011-10-22 ENCOUNTER — Inpatient Hospital Stay (HOSPITAL_COMMUNITY): Payer: Medicare Other

## 2011-10-22 LAB — GLUCOSE, CAPILLARY
Glucose-Capillary: 146 mg/dL — ABNORMAL HIGH (ref 70–99)
Glucose-Capillary: 156 mg/dL — ABNORMAL HIGH (ref 70–99)
Glucose-Capillary: 127 mg/dL — ABNORMAL HIGH (ref 70–99)

## 2011-10-22 LAB — PROTIME-INR: INR: 2.01 — ABNORMAL HIGH (ref 0.00–1.49)

## 2011-10-22 LAB — CBC
HCT: 30.4 % — ABNORMAL LOW (ref 39.0–52.0)
Hemoglobin: 10.5 g/dL — ABNORMAL LOW (ref 13.0–17.0)
MCH: 30.3 pg (ref 26.0–34.0)
MCHC: 34.5 g/dL (ref 30.0–36.0)

## 2011-10-22 LAB — RENAL FUNCTION PANEL
CO2: 23 mEq/L (ref 19–32)
Calcium: 8.5 mg/dL (ref 8.4–10.5)
Creatinine, Ser: 2.08 mg/dL — ABNORMAL HIGH (ref 0.50–1.35)
Glucose, Bld: 118 mg/dL — ABNORMAL HIGH (ref 70–99)

## 2011-10-22 MED ORDER — WARFARIN SODIUM 1 MG PO TABS
1.0000 mg | ORAL_TABLET | Freq: Once | ORAL | Status: AC
  Administered 2011-10-22: 1 mg via ORAL
  Filled 2011-10-22: qty 1

## 2011-10-22 MED ORDER — WARFARIN SODIUM 1 MG PO TABS: 1.0000 mg | ORAL_TABLET | Freq: Every day | ORAL | Status: DC

## 2011-10-22 MED ORDER — WARFARIN SODIUM 2.5 MG PO TABS
2.5000 mg | ORAL_TABLET | Freq: Every day | ORAL | Status: DC
  Administered 2011-10-23: 2.5 mg via ORAL
  Filled 2011-10-22 (×2): qty 1

## 2011-10-22 NOTE — Progress Notes (Signed)
CARDIAC REHAB PHASE I PRE:  Rate/Rhythm: 89 SR w/ PVC  BP:  Supine:   Sitting: 132/81  Standing:    SaO2: 100 RA  MODE:  Ambulation: 550 ft   POST:  Rate/Rhythem: 112 SR w/ PVC  BP:  Supine:   Sitting: 132/84  Standing:    SaO2: 97 RA  Wayne Gordon  Pt ambulated 550 ft assist x 1 with rolling walker and pacer. Tolerated well, no complaints or symptoms reported. SA02 stayed in the upper nineties throughout ambulation. Gait stable, already standing up from recliner without using hands or requiring assistance. Returned to [employer] [facility]. No questions from yesterday's discharge education. Will follow up. Electronically signed by Harriett Sine MS on Saturday October 22 2011 at 1052 EST

## 2011-10-22 NOTE — Progress Notes (Addendum)
5 Days Post-Op Procedure(s) (LRB): MITRAL VALVE REPAIR (MVR) (N/A) CORONARY ARTERY BYPASS GRAFTING (CABG) (N/A)  Subjective: Eating breakfast this am.Feeling fairly well.  Objective: Vital signs in last 24 hours: Patient Vitals for the past 24 hrs:  BP Temp Temp src Pulse Resp SpO2 Weight  10/22/11 0503 139/77 mmHg 97.9 F (36.6 C) Oral 74  18  98 % 242 lb 6.4 oz (109.952 kg)  10/21/11 2242 98/62 mmHg - - 67  - - -  10/21/11 2205 134/68 mmHg 98 F (36.7 C) Oral 68  20  98 % -  10/21/11 1347 110/65 mmHg 98.7 F (37.1 C) Oral 65  21  97 % -   Pre op weight 152 kg (?) Current Weight  10/22/11 242 lb 6.4 oz (109.952 kg)      Intake/Output from previous day: 02/22 0701 - 02/23 0700 In: 726 [P.O.:720; I.V.:6] Out: 1471 [Urine:1470; Stool:1]   Physical Exam:  Cardiovascular: RRR, no murmurs, gallops, or rubs. Pulmonary: Slightly diminished at bases; no rales, wheezes, or rhonchi. Abdomen: Soft, non tender, bowel sounds present. Extremities: Mild bilateral lower extremity edema. Wounds: Clean and dry.  No erythema or signs of infection.  Lab Results: CBC: Basename 10/22/11 0600 10/21/11 0647  WBC 10.2 14.2*  HGB 10.5* 10.8*  HCT 30.4* 31.2*  PLT 180 173   BMET:  Basename 10/22/11 0600 10/21/11 0647  NA 137 138  K 4.2 4.3  CL 105 105  CO2 23 22  GLUCOSE 118* 149*  BUN 36* 42*  CREATININE 2.08* 2.20*  CALCIUM 8.5 8.6    PT/INR:  Basename 10/21/11 0647  LABPROT 19.8*  INR 1.65*   ABG:  INR: Will add last result for INR, ABG once components are confirmed Will add last 4 CBG results once components are confirmed  Assessment/Plan:  1. CV - PVCs, V paced.Continue Coumadin.Await today's PT/INR results.Will stop Lovenox when INR > 1.8. 2.  Pulmonary - Encourage incentive spirometer.CXR this am shows elevation of right hemidiaphragm, atelectasis on right, small b/l pleural effusions, and stable cardiomegaly. 3.  Acute blood loss anemia -H/H stable at  10.5/30.4. 4.Acute on chronic renal failure- Creatinine continues to decrease. Now 2.08. 5.DM-CBGs 113/143/127. Continue Insulin as needed. Was on Metformin pre op but b/c of elevated creatinine, unable to restart at this time.    ZIMMERMAN,DONIELLE MPA-C 10/22/2011    Lab Results  Component Value Date   INR 2.01* 10/22/2011   INR 1.65* 10/21/2011   INR 1.36 10/20/2011   D/c lovenox I have seen and examined Wayne Gordon and agree with the above assessment  and plan.  Delight Ovens MD Beeper [phone] Office [phone] 10/22/2011 12:10 PM

## 2011-10-23 LAB — GLUCOSE, CAPILLARY
Glucose-Capillary: 114 mg/dL — ABNORMAL HIGH (ref 70–99)
Glucose-Capillary: 141 mg/dL — ABNORMAL HIGH (ref 70–99)

## 2011-10-23 LAB — PROTIME-INR
INR: 1.86 — ABNORMAL HIGH (ref 0.00–1.49)
Prothrombin Time: 21.8 seconds — ABNORMAL HIGH (ref 11.6–15.2)

## 2011-10-23 LAB — BASIC METABOLIC PANEL
CO2: 22 mEq/L (ref 19–32)
Calcium: 8.4 mg/dL (ref 8.4–10.5)
GFR calc Af Amer: 38 mL/min — ABNORMAL LOW (ref >90)
GFR calc non Af Amer: 33 mL/min — ABNORMAL LOW (ref >90)
Sodium: 141 mEq/L (ref 135–145)

## 2011-10-23 NOTE — Progress Notes (Addendum)
6 Days Post-Op Procedure(s) (LRB): MITRAL VALVE REPAIR (MVR) (N/A) CORONARY ARTERY BYPASS GRAFTING (CABG) (N/A)  Subjective: Patient's only complaint is still with come difficulty swallowing so will continue a dysphagia 3 diet.  Objective: Vital signs in last 24 hours: Patient Vitals for the past 24 hrs:  BP Temp Temp src Pulse Resp SpO2 Weight  10/23/11 0500 - - - - - - 241 lb 10 oz (109.6 kg)  10/23/11 0418 131/69 mmHg 98.4 F (36.9 C) Oral 87  19  98 % -  10/22/11 1956 140/80 mmHg 98.8 F (37.1 C) Oral 96  - 96 % -  10/22/11 1446 103/78 mmHg 97.1 F (36.2 C) Oral 91  18  95 % -   Pre op weight 152 kg (?) Current Weight  10/23/11 241 lb 10 oz (109.6 kg)      Intake/Output from previous day: 02/23 0701 - 02/24 0700 In: 720 [P.O.:720] Out: 604 [Urine:602; Stool:2]   Physical Exam:  Cardiovascular: RRR, no murmurs, gallops, or rubs. Pulmonary: Slightly diminished at bases; no rales, wheezes, or rhonchi. Abdomen: Soft, non tender, bowel sounds present. Extremities: Mild bilateral lower extremity edema. Wounds: Clean and dry.  No erythema or signs of infection.  Lab Results: CBC:  Basename 10/22/11 0600 10/21/11 0647  WBC 10.2 14.2*  HGB 10.5* 10.8*  HCT 30.4* 31.2*  PLT 180 173   BMET:   Basename 10/23/11 0505 10/22/11 0600  NA 141 137  K 4.1 4.2  CL 108 105  CO2 22 23  GLUCOSE 115* 118*  BUN 30* 36*  CREATININE 1.91* 2.08*  CALCIUM 8.4 8.5    PT/INR:   Basename 10/23/11 0505  LABPROT 21.8*  INR 1.86*   ABG:  INR: Will add last result for INR, ABG once components are confirmed Will add last 4 CBG results once components are confirmed  Assessment/Plan:  1. CV - PVCs, V paced.Appeared to go into afib with CVR as well.Continue Coumadin. HR appears to be improving so hopefully can disconnect external pacer soon. 2.  Pulmonary - Encourage incentive spirometer. 3.  Acute blood loss anemia -H/H stable at 10.5/30.4. 4.Acute on chronic renal failure-  Creatinine continues to decrease. Now down to 1.91. 5.DM-CBGs 156/127/112 . Continue Insulin as needed. Was on Metformin pre op but b/c of elevated creatinine, unable to restart at this time.     ZIMMERMAN,DONIELLE MPA-C 10/23/2011     10/23/2011 7:32 AM   I have seen and examined Loranzo M Jelinski and agree with the above assessment  and plan.  Delight Ovens MD Beeper [phone] Office [phone] 10/23/2011 12:16 PM

## 2011-10-24 DIAGNOSIS — I2510 Atherosclerotic heart disease of native coronary artery without angina pectoris: Secondary | ICD-10-CM

## 2011-10-24 DIAGNOSIS — I059 Rheumatic mitral valve disease, unspecified: Secondary | ICD-10-CM

## 2011-10-24 LAB — BASIC METABOLIC PANEL
CO2: 24 mEq/L (ref 19–32)
Chloride: 107 mEq/L (ref 96–112)
Sodium: 140 mEq/L (ref 135–145)

## 2011-10-24 LAB — PROTIME-INR: INR: 1.81 — ABNORMAL HIGH (ref 0.00–1.49)

## 2011-10-24 LAB — GLUCOSE, CAPILLARY: Glucose-Capillary: 114 mg/dL — ABNORMAL HIGH (ref 70–99)

## 2011-10-24 MED ORDER — LOSARTAN POTASSIUM 50 MG PO TABS: 50.0000 mg | ORAL_TABLET | Freq: Every day | ORAL | Status: DC

## 2011-10-24 MED ORDER — CARVEDILOL 3.125 MG PO TABS: 3.1250 mg | ORAL_TABLET | Freq: Two times a day (BID) | ORAL | Status: DC

## 2011-10-24 MED ORDER — LOSARTAN POTASSIUM 50 MG PO TABS
50.0000 mg | ORAL_TABLET | Freq: Every day | ORAL | Status: DC
  Administered 2011-10-24: 50 mg via ORAL
  Filled 2011-10-24: qty 1

## 2011-10-24 MED ORDER — WARFARIN SODIUM 2.5 MG PO TABS: 2.5000 mg | ORAL_TABLET | Freq: Every day | ORAL | Status: DC

## 2011-10-24 MED ORDER — CARVEDILOL 3.125 MG PO TABS
3.1250 mg | ORAL_TABLET | Freq: Two times a day (BID) | ORAL | Status: DC
Start: 2011-10-24 — End: 2011-10-24
  Administered 2011-10-24: 3.125 mg via ORAL
  Filled 2011-10-24 (×3): qty 1

## 2011-10-24 MED FILL — Heparin Sodium (Porcine) Inj 1000 Unit/ML: INTRAMUSCULAR | Qty: 10 | Status: AC

## 2011-10-24 MED FILL — Albumin, Human Inj 5%: INTRAVENOUS | Qty: 250 | Status: AC

## 2011-10-24 MED FILL — Electrolyte-R (PH 7.4) Solution: INTRAVENOUS | Qty: 7000 | Status: AC

## 2011-10-24 MED FILL — Mannitol IV Soln 20%: INTRAVENOUS | Qty: 500 | Status: AC

## 2011-10-24 MED FILL — Lidocaine HCl IV Inj 20 MG/ML: INTRAVENOUS | Qty: 5 | Status: AC

## 2011-10-24 MED FILL — Heparin Sodium (Porcine) Inj 1000 Unit/ML: INTRAMUSCULAR | Qty: 30 | Status: AC

## 2011-10-24 MED FILL — Sodium Chloride IV Soln 0.9%: INTRAVENOUS | Qty: 1000 | Status: AC

## 2011-10-24 MED FILL — Sodium Bicarbonate IV Soln 8.4%: INTRAVENOUS | Qty: 50 | Status: AC

## 2011-10-24 MED FILL — Sodium Chloride Irrigation Soln 0.9%: Qty: 3000 | Status: AC

## 2011-10-24 NOTE — Progress Notes (Addendum)
                    [address]            [place] [address]          [phone]     7 Days Post-Op Procedure(s) (LRB): MITRAL VALVE REPAIR (MVR) (N/A) CORONARY ARTERY BYPASS GRAFTING (CABG) (N/A)  Subjective: Cough with some clear sputum.  Feels well otherwise.  Objective: Vital signs in last 24 hours: Patient Vitals for the past 24 hrs:  BP Temp Temp src Pulse Resp SpO2 Weight  10/24/11 0420 141/76 mmHg 98.6 F (37 C) Oral 101  19  97 % 109.272 kg (240 lb 14.4 oz)  10/23/11 1958 131/59 mmHg 98.7 F (37.1 C) Oral 82  20  95 % -  10/23/11 1446 136/80 mmHg 97.7 F (36.5 C) Oral 91  18  97 % -   Current Weight  10/24/11 109.272 kg (240 lb 14.4 oz)     Intake/Output from previous day: 02/24 0701 - 02/25 0700 In: 720 [P.O.:720] Out: 801 [Urine:800; Stool:1]  CBGs 114-141-114-118  PHYSICAL EXAM:  Heart: RRR, freq PVCs Lungs: clear Wound: clean and dry Extremities: trace RLE edema, some R thigh ecchymosis, resolving  Lab Results: CBC: Basename 10/22/11 0600  WBC 10.2  HGB 10.5*  HCT 30.4*  PLT 180   BMET:  Basename 10/24/11 0625 10/23/11 0505  NA 140 141  K 3.9 4.1  CL 107 108  CO2 24 22  GLUCOSE 118* 115*  BUN 20 30*  CREATININE 1.75* 1.91*  CALCIUM 8.7 8.4    PT/INR:  Basename 10/24/11 0625  LABPROT 21.3*  INR 1.81*     Assessment/Plan: S/P Procedure(s) (LRB): MITRAL VALVE REPAIR (MVR) (N/A) CORONARY ARTERY BYPASS GRAFTING (CABG) (N/A) CV- generally maintaining SR with PVCs.  Off beta blocker secondary to bradycardia.  Continue Coumadin and monitor. A/Chr RI- Cr trending down.   DM- sugars stable on SSI.  Metformin on hold due to increased Cr.  ?Needs another agent for home. Swallowing stable- cont D3 diet. CRPI, pulm toilet ?home soon   LOS: 14 days    COLLINS,GINA H 10/24/2011   I have seen and examined the patient and agree with the assessment and plan as outlined.  Wayne Gordon is maintaining NSR and appears  stable for d/c home soon.  Will d/c pacing wires and start low dose carvedilol.  Renal function has improved and is nearing baseline.  Restart Cozaar at 1/2 previous dose.  Discussed with Dr Clifton James who feels that we can probably resume metformin for d/c.  Tentatively plan d/c home tomorrow.  Purcell Nails 10/24/2011 8:32 AM   Wayne Gordon really hopes to go home today.  Will consider d/c home this afternoon if BP and rhythm stable after getting his wires out and receiving Coreg and Cozaar this morning.  Lanorris Kalisz H

## 2011-10-24 NOTE — Progress Notes (Signed)
Ambulated patient 800 ft with rolling walker.  Patient tolerated well.  Returned to bed.  VSS.  Will monitor.

## 2011-10-24 NOTE — Progress Notes (Signed)
D/C'd patients EPW per MD order and hospital policy.  Patient tolerated well.  All ends intact.  No bleeding.  Bedrest for 1 hour, vitals stable.  Will continue to monitor.

## 2011-10-24 NOTE — Progress Notes (Addendum)
    SUBJECTIVE: Feeling better. No chest pain or SOB. NO events.   BP 141/76  Pulse 101  Temp(Src) 98.6 F (37 C) (Oral)  Resp 19  Ht 6\' 5"  (1.956 m)  Wt 240 lb 14.4 oz (109.272 kg)  BMI 28.57 kg/m2  SpO2 97%  Intake/Output Summary (Last 24 hours) at 10/24/11 0454 Last data filed at 10/24/11 0600  Gross per 24 hour  Intake    720 ml  Output    801 ml  Net    -81 ml    PHYSICAL EXAM General: Well developed, well nourished, in no acute distress. Alert and oriented x 3.  Psych:  Good affect, responds appropriately Neck: No JVD. No masses noted.  Lungs: Clear bilaterally with no wheezes or rhonci noted.  Heart: RRR with no murmurs noted. Abdomen: Bowel sounds are present. Soft, non-tender.  Extremities: No lower extremity edema.   LABS: Basic Metabolic Panel:  Basename 10/24/11 0625 10/23/11 0505 10/22/11 0600  NA 140 141 --  K 3.9 4.1 --  CL 107 108 --  CO2 24 22 --  GLUCOSE 118* 115* --  BUN 20 30* --  CREATININE 1.75* 1.91* --  CALCIUM 8.7 8.4 --  MG -- -- --  PHOS -- -- 3.3   CBC:  Basename 10/22/11 0600  WBC 10.2  NEUTROABS --  HGB 10.5*  HCT 30.4*  MCV 87.9  PLT 180     Current Meds:    . aspirin EC  81 mg Oral Daily  . bisacodyl  10 mg Oral Daily   Or  . bisacodyl  10 mg Rectal Daily  . docusate sodium  200 mg Oral Daily  . feeding supplement  237 mL Oral Q supper  . insulin aspart  0-15 Units Subcutaneous TID WC  . insulin aspart  0-5 Units Subcutaneous QHS  . niacin  1,000 mg Oral QHS  . pantoprazole  40 mg Oral QAC breakfast  . rosuvastatin  20 mg Oral QPM  . sodium chloride  3 mL Intravenous Q12H  . warfarin  2.5 mg Oral q1800     ASSESSMENT AND PLAN:  Pt is s/p MV repair and 3V CABG. He is doing well overall.   1. CAD: Stable s/p 3V CABG. He is on an ASA and statin. Consider low dose beta blocker as an outpt with tachycardia/PVCs.   2. Ischemic CM: Will need to be placed back on beta blocker and Ace-inhibitor and have  titration as an outpatient.  3. S/p MV repair  4. Atrial fibrillation: NOw in NSR. On coumadin.   4. Chronic renal insufficiency with acute worsening: Trending back toward baseline.   MCALHANY,CHRISTOPHER  2/25/20138:08 AM

## 2011-10-24 NOTE — Progress Notes (Signed)
2CARDIAC REHAB PHASE I   PRE:  Rate/Rhythm: 94 SR PVC   BP:  Supine:   Sitting: 112/60  Standing:    SaO2: 98 RA  MODE:  Ambulation: 890 ft   POST:  Rate/Rhythem: 109 ST PVC  BP:  Supine:   Sitting: 112/60  Standing:    SaO2: 100 RA 1300-1335 Tolerated ambulation well using walker. Gait steady with walker. Walked 890 ft. Only c/o was of back pain, took one standing rest stop. VS stable. Back to recliner after walk with call light in reach.Pt has decided to go to Outpt. CRP in [place] or [place], will send referrals there, he will decide which one.  Beatrix Fetters

## 2011-10-26 ENCOUNTER — Encounter: Payer: Self-pay | Admitting: Thoracic Surgery (Cardiothoracic Vascular Surgery)

## 2011-10-28 ENCOUNTER — Ambulatory Visit (INDEPENDENT_AMBULATORY_CARE_PROVIDER_SITE_OTHER): Payer: Medicare Other | Admitting: *Deleted

## 2011-10-28 DIAGNOSIS — Z9889 Other specified postprocedural states: Secondary | ICD-10-CM

## 2011-10-28 DIAGNOSIS — Z7901 Long term (current) use of anticoagulants: Secondary | ICD-10-CM | POA: Insufficient documentation

## 2011-10-28 DIAGNOSIS — Z951 Presence of aortocoronary bypass graft: Secondary | ICD-10-CM

## 2011-10-28 LAB — POCT INR: INR: 1.8

## 2011-10-31 ENCOUNTER — Encounter: Payer: Self-pay | Admitting: Cardiovascular Disease

## 2011-10-31 ENCOUNTER — Encounter: Payer: Medicare Other | Admitting: Cardiovascular Disease

## 2011-10-31 ENCOUNTER — Ambulatory Visit (INDEPENDENT_AMBULATORY_CARE_PROVIDER_SITE_OTHER): Payer: Self-pay | Admitting: Cardiovascular Disease

## 2011-10-31 VITALS — BP 134/83 | HR 96 | Ht 75.0 in | Wt 239.0 lb

## 2011-10-31 DIAGNOSIS — N289 Disorder of kidney and ureter, unspecified: Secondary | ICD-10-CM

## 2011-10-31 DIAGNOSIS — I5022 Chronic systolic (congestive) heart failure: Secondary | ICD-10-CM | POA: Insufficient documentation

## 2011-10-31 DIAGNOSIS — R0602 Shortness of breath: Secondary | ICD-10-CM

## 2011-10-31 DIAGNOSIS — Z7901 Long term (current) use of anticoagulants: Secondary | ICD-10-CM | POA: Insufficient documentation

## 2011-10-31 DIAGNOSIS — I255 Ischemic cardiomyopathy: Secondary | ICD-10-CM | POA: Insufficient documentation

## 2011-10-31 DIAGNOSIS — I219 Acute myocardial infarction, unspecified: Secondary | ICD-10-CM

## 2011-10-31 DIAGNOSIS — I2589 Other forms of chronic ischemic heart disease: Secondary | ICD-10-CM

## 2011-10-31 MED ORDER — FUROSEMIDE 40 MG PO TABS: 40.0000 mg | ORAL_TABLET | Freq: Every day | ORAL | Status: DC

## 2011-10-31 MED ORDER — POTASSIUM CHLORIDE CRYS ER 20 MEQ PO TBCR: 20.0000 meq | EXTENDED_RELEASE_TABLET | Freq: Every day | ORAL | Status: DC

## 2011-10-31 MED ORDER — CARVEDILOL 6.25 MG PO TABS: 6.2500 mg | ORAL_TABLET | Freq: Two times a day (BID) | ORAL | Status: DC

## 2011-10-31 NOTE — Assessment & Plan Note (Signed)
Will increase carvedilol to 6.25 mg twice a day, with target dose 25 mg twice a day. Continue current medication regimen, which includes low-dose aspirin. Will need reassessment of lipid status in the next 2 to 3 months. Of note, patient has documented allergy to ACE inhibitor (angioedema). Continue current treatment with losartan

## 2011-10-31 NOTE — Assessment & Plan Note (Signed)
Will check a BMET for continued close monitoring of renal function.

## 2011-10-31 NOTE — Progress Notes (Signed)
HPI: Patient presents for post hospital followup from [facility]. Initially seen by [place] here at [facility] for acute CHF and NST EMI (peak troponin 0.07/NL MBs). 2-D echo: EF 25-30%, moderately severe MR. We arranged transfer to [facility] for R./L. cardiac catheterization.  Patient found to have severe three-vessel CAD; EF 25%, moderately elevated filling pressures/mild PHTN. Dr. Kirke Corin recommended coronary artery bypass graft surgery and mitral valve repair. She underwent subsequent 3-V CABG with concomitant MV annuloplasty, by Dr. Cornelius Moras. Postop course benign, with no documented dysrhythmia. Renal insufficiency closely monitored, with peak creatinine 2.2, postoperative. Carotid Dopplers indicated 60-79% LICA. Patient was started on Coumadin, and has established in our clinic: INR 1.8, March 1.   Clinically, patient reports significant volume overload this past weekend, necessitating a call to Dr. Orvan July office. He was placed on Lasix 40 mg daily/20 mEq supplemental potassium. Of note, he was not discharged on a diuretic. Since Saturday, he has diuresed approximately 8 pounds. He apparently also developed scrotal edema, worsening LE edema, but no orthopnea or PND. He has not had any followup labs. Of note, he had post hospital labs October 23, indicating improved renal function with BUN 20, creatinine 1.75; potassium 3.9.  He is scheduled to followup with Dr. Cornelius Moras on March 18.  Allergies  Allergen Reactions  . Penicillins Palpitations    Current Outpatient Prescriptions  Medication Sig Dispense Refill  . aspirin 81 MG chewable tablet Chew 81 mg by mouth daily.      [name] Kitchen CALCIUM-MAGNESIUM-ZINC PO Take 2 tablets by mouth daily.      . carvedilol (COREG) 3.125 MG tablet Take 1 tablet (3.125 mg total) by mouth 2 (two) times daily with a meal.  30 tablet  1  . losartan (COZAAR) 50 MG tablet Take 1 tablet (50 mg total) by mouth daily.  30 tablet  1  . metFORMIN (GLUCOPHAGE) 850 MG tablet Take 850 mg by mouth 2  (two) times daily with a meal.      . Multiple Vitamins-Minerals (MULTIVITAMINS THER. W/MINERALS) TABS Take 1 tablet by mouth daily.      . niacin (NIASPAN) 1000 MG CR tablet Take 1,000 mg by mouth at bedtime.      [name] Kitchen omega-3 acid ethyl esters (LOVAZA) 1 G capsule Take 2 g by mouth daily.      [name] Kitchen omeprazole (PRILOSEC) 20 MG capsule Take 20 mg by mouth daily.      . rosuvastatin (CRESTOR) 20 MG tablet Take 20 mg by mouth every evening.      . testosterone cypionate (DEPOTESTOTERONE CYPIONATE) 200 MG/ML injection Inject 200 mg into the muscle every 14 (fourteen) days.      . traMADol (ULTRAM) 50 MG tablet Take 1 tablet (50 mg total) by mouth every 6 (six) hours as needed for pain.  45 tablet  0  . warfarin (COUMADIN) 2.5 MG tablet Take 1 tablet (2.5 mg total) by mouth daily at 6 PM. Or as directed by [facility] cardiology.  30 tablet  1    Past Medical History  Diagnosis Date  . Hypertension   . High cholesterol   . Heart murmur   . Shortness of breath     "lying down & w/exertion"  . Diabetes mellitus   . GERD (gastroesophageal reflux disease)   . Arthritis   . Congestive heart failure   . Mitral regurgitation   . S/P mitral valve repair 10/17/2011  . S/P CABG x 3 10/17/2011    Past Surgical History  Procedure Date  .  Knee arthroscopy ~ 2008    right  . Nasal sinus surgery 1990's    right  . Mitral valve repair 10/17/2011    Procedure: MITRAL VALVE REPAIR (MVR);  Surgeon: Purcell Nails, MD;  Location: [facility] OR;  Service: Open Heart Surgery;  Laterality: N/A;  . Coronary artery bypass graft 10/17/2011    Procedure: CORONARY ARTERY BYPASS GRAFTING (CABG);  Surgeon: Purcell Nails, MD;  Location: [facility] OR;  Service: Open Heart Surgery;  Laterality: N/A;    History   Social History  . Marital Status: Married    Spouse Name: N/A    Number of Children: N/A  . Years of Education: N/A   Occupational History  . Retired    Social History Main Topics  . Smoking status: Never Smoker   .  Smokeless tobacco: Never Used  . Alcohol Use: No  . Drug Use: No  . Sexually Active: Yes   Other Topics Concern  . Not on file   Social History Narrative  . No narrative on file    No family history on file.  ROS: no nausea, vomiting; no fever, chills; no melena, hematochezia; no claudication  PHYSICAL EXAM: There were no vitals taken for this visit. GENERAL: 78 year-old male, sitting upright; NAD HEENT: NCAT, PERRLA, EOMI; sclera clear; no xanthelasma NECK: palpable bilateral carotid pulses, no bruits; no JVD; no TM LUNGS: Diminished breath sounds in right base; no crackles, wheezes CARDIAC: Regular rhythm, increased rate (S1, S2); no significant murmurs; no rubs or gallops ABDOMEN: soft, non-tender; intact BS EXTREMETIES: 1+ bilateral peripheral edema SKIN: warm/dry; no obvious rash/lesions MUSCULOSKELETAL: no joint deformity NEURO: no focal deficit; NL affect   EKG: reviewed and available in Electronic Records   ASSESSMENT & PLAN:

## 2011-10-31 NOTE — Patient Instructions (Signed)
Follow up as scheduled. Continue Lasix and K-Dur. Increase Coreg (carvedilol) to 6.25 mg two times a day. You may take 2 of your 3.125 mg tablets to equal 6.25 mg until gone and then get new prescription filled for 6.25 mg tablets. A new prescription was sent to your pharmacy to reflect this change.  Your physician recommends that you go to the [facility] for lab work: BMET/BNP.

## 2011-10-31 NOTE — Assessment & Plan Note (Signed)
Followup in our Coumadin clinic, as scheduled. Recommendation as to duration of Coumadin anticoagulation, to be deferred to Dr. Tressie Stalker.

## 2011-10-31 NOTE — Assessment & Plan Note (Signed)
Recommendation is that patient remain on Lasix indefinitely, and for now to be continued on current 40 mg dose. We'll continue supplemental potassium. We'll check followup BMET, as well as a BNP level. Will schedule early return followup in one month.

## 2011-11-01 NOTE — Progress Notes (Signed)
I have seen and examined the patient. I agree with the above note with the addition of: [name] is having dyspnea, orthopnea and weight gain. This improved after he was started on Lasix. Continue po lasix. Increase Coreg. He will need repeat CXR as he seems to have pleural effusion by exam.   Lorine Bears 11/01/2011 4:37 PM

## 2011-11-03 ENCOUNTER — Encounter: Payer: Self-pay | Admitting: *Deleted

## 2011-11-04 ENCOUNTER — Ambulatory Visit (INDEPENDENT_AMBULATORY_CARE_PROVIDER_SITE_OTHER): Payer: Medicare Other | Admitting: *Deleted

## 2011-11-04 DIAGNOSIS — Z9889 Other specified postprocedural states: Secondary | ICD-10-CM

## 2011-11-04 DIAGNOSIS — Z7901 Long term (current) use of anticoagulants: Secondary | ICD-10-CM

## 2011-11-04 DIAGNOSIS — Z951 Presence of aortocoronary bypass graft: Secondary | ICD-10-CM

## 2011-11-08 ENCOUNTER — Other Ambulatory Visit: Payer: Self-pay | Admitting: Thoracic Surgery (Cardiothoracic Vascular Surgery)

## 2011-11-08 ENCOUNTER — Encounter: Payer: Medicare Other | Admitting: Cardiovascular Disease

## 2011-11-08 DIAGNOSIS — I2510 Atherosclerotic heart disease of native coronary artery without angina pectoris: Secondary | ICD-10-CM

## 2011-11-11 ENCOUNTER — Ambulatory Visit (INDEPENDENT_AMBULATORY_CARE_PROVIDER_SITE_OTHER): Payer: Medicare Other | Admitting: *Deleted

## 2011-11-11 DIAGNOSIS — Z951 Presence of aortocoronary bypass graft: Secondary | ICD-10-CM

## 2011-11-11 DIAGNOSIS — Z9889 Other specified postprocedural states: Secondary | ICD-10-CM

## 2011-11-11 DIAGNOSIS — Z7901 Long term (current) use of anticoagulants: Secondary | ICD-10-CM

## 2011-11-11 LAB — POCT INR: INR: 1.5

## 2011-11-14 ENCOUNTER — Ambulatory Visit
Admission: RE | Admit: 2011-11-14 | Discharge: 2011-11-14 | Disposition: A | Discharge status: Home / Self Care | Payer: Medicare Other | Source: Ambulatory Visit | Attending: Thoracic Surgery (Cardiothoracic Vascular Surgery) | Admitting: Thoracic Surgery (Cardiothoracic Vascular Surgery)

## 2011-11-14 ENCOUNTER — Encounter: Payer: Self-pay | Admitting: Thoracic Surgery (Cardiothoracic Vascular Surgery)

## 2011-11-14 ENCOUNTER — Ambulatory Visit (INDEPENDENT_AMBULATORY_CARE_PROVIDER_SITE_OTHER): Payer: Self-pay | Admitting: Thoracic Surgery (Cardiothoracic Vascular Surgery)

## 2011-11-14 VITALS — BP 115/76 | HR 92 | Resp 16 | Ht 77.0 in | Wt 219.0 lb

## 2011-11-14 DIAGNOSIS — Z9889 Other specified postprocedural states: Secondary | ICD-10-CM

## 2011-11-14 DIAGNOSIS — I2510 Atherosclerotic heart disease of native coronary artery without angina pectoris: Secondary | ICD-10-CM

## 2011-11-14 DIAGNOSIS — Z09 Encounter for follow-up examination after completed treatment for conditions other than malignant neoplasm: Secondary | ICD-10-CM

## 2011-11-14 DIAGNOSIS — Z951 Presence of aortocoronary bypass graft: Secondary | ICD-10-CM

## 2011-11-14 NOTE — Progress Notes (Signed)
[address]            [place] [address]          [phone]     CARDIOTHORACIC SURGERY OFFICE NOTE  Referring Provider is Iran Ouch, MD PCP is [name],[name], DO, DO   HPI:  Patient returns for routine followup status post mitral valve repair and coronary artery bypass grafting x3 on 10/17/2011. His postoperative recovery has been uneventful. Since hospital discharge the patient has continued to do exceptionally well. He has had his prothrombin time checked and Coumadin adjusted through the [facility]. His physical activity has gradually improved. He reports only mild residual soreness in his chest and he is now only taking pain relievers in the evening to help him sleep. He's not had any dizzy spells nor tachypalpitations. His appetite is good. He reports that his breathing is better than it was prior to surgery. He plans to start the cardiac rehabilitation program later this week. Overall he has no complaints.   Current Outpatient Prescriptions  Medication Sig Dispense Refill  . aspirin 81 MG tablet Take 81 mg by mouth daily.      [name] Kitchen CALCIUM-MAGNESIUM-ZINC PO Take 2 tablets by mouth daily.      . carvedilol (COREG) 6.25 MG tablet Take 1 tablet (6.25 mg total) by mouth 2 (two) times daily.  60 tablet  6  . losartan (COZAAR) 50 MG tablet Take 1 tablet (50 mg total) by mouth daily.  30 tablet  1  . metFORMIN (GLUCOPHAGE) 850 MG tablet Take 850 mg by mouth 2 (two) times daily with a meal.      . niacin (NIASPAN) 1000 MG CR tablet Take 1,000 mg by mouth at bedtime.      [name] Kitchen omega-3 acid ethyl esters (LOVAZA) 1 G capsule Take 2 g by mouth daily.      [name] Kitchen omeprazole (PRILOSEC) 20 MG capsule Take 20 mg by mouth daily.      . rosuvastatin (CRESTOR) 20 MG tablet Take 20 mg by mouth every evening.      . testosterone cypionate (DEPOTESTOTERONE CYPIONATE) 200 MG/ML injection Inject 200 mg into the muscle every 14 (fourteen) days.      .  traMADol (ULTRAM) 50 MG tablet Take 50 mg by mouth every 6 (six) hours as needed.      . warfarin (COUMADIN) 2.5 MG tablet Take 1 tablet (2.5 mg total) by mouth daily at 6 PM. Or as directed by [facility] cardiology.  30 tablet  1  . Multiple Vitamins-Minerals (MULTIVITAMINS THER. W/MINERALS) TABS Take 1 tablet by mouth daily.          Physical Exam:   BP 115/76  Pulse 92  Resp 16  Ht 6\' 5"  (1.956 m)  Wt 219 lb (99.338 kg)  BMI 25.97 kg/m2  SpO2 97%  General:  Well-appearing  Chest:   Clear to auscultation with slightly diminished breath sounds right lung base  CV:   Regular rate and rhythm without murmur  Incisions:  Clean and dry healing nicely, sternum is stable  Abdomen:  Soft and nontender  Extremities:  Warm and well-perfused with no lower extremity edema  Diagnostic Tests:   CHEST - 2 VIEW 11/14/2011 Comparison: Chest x-ray of 10/22/2011  Findings: Aeration of the right lung base has improved slightly.  There is still atelectasis and a small right effusion with a small  left effusion remaining as well. Cardiomegaly is stable. Median  sternotomy sutures are noted from prior CABG.  IMPRESSION:  Minimally improved aeration. Persistent small effusions and right  basilar atelectasis.  Original Report Authenticated By: Juline Patch, M.D.   Impression:  Patient is doing very well one month following mitral valve repair and coronary artery bypass grafting. He has mild residual elevation of the right hemidiaphragm with right lower lobe atelectasis. This has improved since his most recent chest x-ray her prior to hospital discharge. Clinically he is doing exceptionally well.  Plan:  I've encouraged patient to continue to increase his physical activity as tolerated with his only limitations remaining that he refrain from heavy lifting or strenuous use of his arms or shoulders for at least another 2 months. I think he can start driving an automobile. I've encouraged him to get  involved in the cardiac rehabilitation program which he plans to do. We have discussed his favorite hobby golfing, and I think he can go ahead and start chipping and putting but I would ask that he refrain from taking a full sling for at least 2 more months. All of his questions been addressed.   Salvatore Decent. Cornelius Moras, MD 11/14/2011 1:39 PM

## 2011-11-14 NOTE — Patient Instructions (Signed)
The patient has been instructed that they may return driving an automobile as long as they are no longer requiring oral narcotic pain relievers during the daytime.  They have been advised to start driving short distances during the daylight and gradually increase from there as they feel comfortable. The patient has been reminded to continue to avoid any heavy lifting or strenuous use of arms or shoulders for at least a total of three months from the time of surgery. . 

## 2011-11-17 ENCOUNTER — Encounter (HOSPITAL_COMMUNITY): Payer: Self-pay

## 2011-11-17 ENCOUNTER — Encounter (HOSPITAL_COMMUNITY)
Admission: RE | Admit: 2011-11-17 | Discharge: 2011-11-17 | Disposition: A | Discharge status: Home / Self Care | Payer: Medicare Other | Source: Ambulatory Visit | Attending: Cardiovascular Disease | Admitting: Cardiovascular Disease

## 2011-11-17 DIAGNOSIS — I252 Old myocardial infarction: Secondary | ICD-10-CM | POA: Insufficient documentation

## 2011-11-17 DIAGNOSIS — Z5189 Encounter for other specified aftercare: Secondary | ICD-10-CM | POA: Insufficient documentation

## 2011-11-17 DIAGNOSIS — I5022 Chronic systolic (congestive) heart failure: Secondary | ICD-10-CM | POA: Insufficient documentation

## 2011-11-17 DIAGNOSIS — I509 Heart failure, unspecified: Secondary | ICD-10-CM | POA: Insufficient documentation

## 2011-11-17 DIAGNOSIS — I059 Rheumatic mitral valve disease, unspecified: Secondary | ICD-10-CM | POA: Insufficient documentation

## 2011-11-17 DIAGNOSIS — I2589 Other forms of chronic ischemic heart disease: Secondary | ICD-10-CM | POA: Insufficient documentation

## 2011-11-17 DIAGNOSIS — I2510 Atherosclerotic heart disease of native coronary artery without angina pectoris: Secondary | ICD-10-CM | POA: Insufficient documentation

## 2011-11-17 NOTE — Patient Instructions (Signed)
Pt has finished orientation and is scheduled to start CR on 11/21/11 at 9:30. Pt has been instructed to arrive to class 15 minutes early for scheduled class. Pt has been instructed to wear comfortable clothing and shoes with rubber soles. Pt has been told to take their medications 1 hour prior to coming to class.  If the patient is not going to attend class, he/she has been instructed to call.

## 2011-11-17 NOTE — Progress Notes (Signed)
During orientation advised patient on arrival and appointment times what to wear, what to do before, during and after exercise. Reviewed attendance and class policy. Talked about inclement weather and class consultation policy. Pt is scheduled to start Cardiac Rehab on 11/21/11 at 9:30 am. Pt was advised to come to class 5 minutes before class starts. He was also given instructions on meeting with the dietician and attending the Family Structure classes. Pt is eager to get started. Pt may decide to only come for 2 weeks due to co pay. Then he will resume his exercise regime at the [facility].

## 2011-11-18 ENCOUNTER — Ambulatory Visit (INDEPENDENT_AMBULATORY_CARE_PROVIDER_SITE_OTHER): Payer: Medicare Other | Admitting: *Deleted

## 2011-11-18 DIAGNOSIS — Z9889 Other specified postprocedural states: Secondary | ICD-10-CM

## 2011-11-18 DIAGNOSIS — Z951 Presence of aortocoronary bypass graft: Secondary | ICD-10-CM

## 2011-11-18 DIAGNOSIS — Z7901 Long term (current) use of anticoagulants: Secondary | ICD-10-CM

## 2011-11-18 LAB — POCT INR: INR: 1.6

## 2011-11-18 MED ORDER — WARFARIN SODIUM 5 MG PO TABS: 5.0000 mg | ORAL_TABLET | ORAL | Status: DC

## 2011-11-21 ENCOUNTER — Encounter (HOSPITAL_COMMUNITY)
Admission: RE | Admit: 2011-11-21 | Discharge: 2011-11-21 | Disposition: A | Discharge status: Home / Self Care | Payer: Medicare Other | Source: Ambulatory Visit | Attending: Cardiovascular Disease | Admitting: Cardiovascular Disease

## 2011-11-23 ENCOUNTER — Encounter (HOSPITAL_COMMUNITY)
Admission: RE | Admit: 2011-11-23 | Discharge: 2011-11-23 | Disposition: A | Discharge status: Home / Self Care | Payer: Medicare Other | Source: Ambulatory Visit | Attending: Cardiovascular Disease | Admitting: Cardiovascular Disease

## 2011-11-25 ENCOUNTER — Encounter (HOSPITAL_COMMUNITY)
Admission: RE | Admit: 2011-11-25 | Discharge: 2011-11-25 | Disposition: A | Discharge status: Home / Self Care | Payer: Medicare Other | Source: Ambulatory Visit | Attending: Cardiovascular Disease | Admitting: Cardiovascular Disease

## 2011-11-28 ENCOUNTER — Ambulatory Visit (INDEPENDENT_AMBULATORY_CARE_PROVIDER_SITE_OTHER): Payer: Medicare Other | Admitting: *Deleted

## 2011-11-28 ENCOUNTER — Ambulatory Visit (INDEPENDENT_AMBULATORY_CARE_PROVIDER_SITE_OTHER): Payer: Medicare Other | Admitting: Physician Assistant

## 2011-11-28 ENCOUNTER — Encounter: Payer: Self-pay | Admitting: Physician Assistant

## 2011-11-28 ENCOUNTER — Encounter (HOSPITAL_COMMUNITY): Payer: Medicare Other

## 2011-11-28 VITALS — BP 133/71 | HR 89 | Ht 77.0 in | Wt 232.0 lb

## 2011-11-28 DIAGNOSIS — I5022 Chronic systolic (congestive) heart failure: Secondary | ICD-10-CM

## 2011-11-28 DIAGNOSIS — Z9889 Other specified postprocedural states: Secondary | ICD-10-CM

## 2011-11-28 DIAGNOSIS — I10 Essential (primary) hypertension: Secondary | ICD-10-CM

## 2011-11-28 DIAGNOSIS — Z7901 Long term (current) use of anticoagulants: Secondary | ICD-10-CM

## 2011-11-28 DIAGNOSIS — Z79899 Other long term (current) drug therapy: Secondary | ICD-10-CM

## 2011-11-28 DIAGNOSIS — Z951 Presence of aortocoronary bypass graft: Secondary | ICD-10-CM

## 2011-11-28 DIAGNOSIS — N289 Disorder of kidney and ureter, unspecified: Secondary | ICD-10-CM

## 2011-11-28 MED ORDER — CARVEDILOL 12.5 MG PO TABS: 12.5000 mg | ORAL_TABLET | Freq: Two times a day (BID) | ORAL | Status: DC

## 2011-11-28 MED ORDER — SPIRONOLACTONE 25 MG PO TABS: 25.0000 mg | ORAL_TABLET | Freq: Every day | ORAL | Status: DC

## 2011-11-28 NOTE — Assessment & Plan Note (Signed)
Patient to continue on Coumadin anticoagulation, pending release by Dr. Lorenda Hatchet. Anticipate this will be at time of next OV with him, in approximately 2 months. In the meanwhile, he is being monitored in our clinic.

## 2011-11-28 NOTE — Assessment & Plan Note (Signed)
Euvolemic by history and exam. Patient has lost 7 lbs. Recent labs stable. Will titrate carvedilol to 12.5 mg twice a day, and start Aldactone 25 mg daily. Will schedule repeat labs for close monitoring of potassium and renal function. We'll plan return visit in 3 months, at which time we will arrange for a followup 2-D echo, for reassessment of LVF.

## 2011-11-28 NOTE — Assessment & Plan Note (Signed)
Recent followup labs stable with creatinine 1.4. We'll reassess, following addition of Aldactone.

## 2011-11-28 NOTE — Patient Instructions (Signed)
Follow up in 3 months. Increase Coreg (carvidolol) to 12.5 mg two times a day. You may take _2_ of your _6.25_ mg tablets two times a day until gone, and then get new prescription filled for _12.5_ mg tablets. A new prescription was sent to your pharmacy to reflect this change.  Start Aldactone (spironolactone) 25 mg daily. Your physician recommends that you go to the [facility] for lab work: BMET--DO LABS ON 4/4 (3 days), 4/8 (7 days), 5/1 (1 month), 6/1 (2 months), and 7/1 (3 months).

## 2011-11-28 NOTE — Assessment & Plan Note (Signed)
Will closely monitor, following up titration of carvedilol

## 2011-11-28 NOTE — Progress Notes (Signed)
HPI: Patient returns for early scheduled followup.  Since his last OV, he had a scheduled visit with Dr. Cornelius Moras on 3/18, at which time he had a scheduled CXR. He states that everything looked good, and he is to return to Dr. Cornelius Moras in 2 months.  Clinically, he continues to slowly progress and started cardiac rehabilitation last week. He has lost 7 pounds. Recent f/u labs showed improved creatinine levle 1.4, down from 1.6, BUN 15, potassium 3.7, and BNP essentially unchanged at 480.  Clinically, he denies any symptoms suggestive of decompensated heart failure or exertional CP. He denies tachycardia palpitations.   Allergies  Allergen Reactions  . Penicillins Palpitations    Current Outpatient Prescriptions  Medication Sig Dispense Refill  . aspirin 81 MG tablet Take 81 mg by mouth daily.      [name] Kitchen CALCIUM-MAGNESIUM-ZINC PO Take 2 tablets by mouth daily.      . carvedilol (COREG) 6.25 MG tablet Take 1 tablet (6.25 mg total) by mouth 2 (two) times daily.  60 tablet  6  . losartan (COZAAR) 50 MG tablet Take 1 tablet (50 mg total) by mouth daily.  30 tablet  1  . metFORMIN (GLUCOPHAGE) 850 MG tablet Take 850 mg by mouth 2 (two) times daily with a meal.      . Multiple Vitamins-Minerals (MULTIVITAMINS THER. W/MINERALS) TABS Take 1 tablet by mouth daily.      . niacin (NIASPAN) 1000 MG CR tablet Take 1,000 mg by mouth at bedtime.      . Omega-3 Fatty Acids (FISH OIL) 1000 MG CAPS Take 1 capsule by mouth 2 (two) times daily.      [name] Kitchen omeprazole (PRILOSEC) 20 MG capsule Take 20 mg by mouth daily.      . rosuvastatin (CRESTOR) 20 MG tablet Take 20 mg by mouth every evening.      . testosterone cypionate (DEPOTESTOTERONE CYPIONATE) 200 MG/ML injection Inject 200 mg into the muscle every 14 (fourteen) days.      [name] Kitchen warfarin (COUMADIN) 5 MG tablet Take 1 tablet (5 mg total) by mouth as directed. Take coumadin 1 tablet daily except 1 1/2 tablets on Fridays  45 tablet  3  . DISCONTD: furosemide (LASIX) 40 MG  tablet Take 1 tablet (40 mg total) by mouth daily.  30 tablet  6  . DISCONTD: potassium chloride SA (K-DUR,KLOR-CON) 20 MEQ tablet Take 1 tablet (20 mEq total) by mouth daily.  30 tablet  6    Past Medical History  Diagnosis Date  . Hypertension   . Hyperlipidemia   . Heart murmur   . Shortness of breath     "lying down & w/exertion"  . Diabetes mellitus   . GERD (gastroesophageal reflux disease)   . Arthritis   . Chronic systolic heart failure   . Mitral regurgitation     S/P mitral valve repair [[id number]][  . Ischemic cardiomyopathy     S/P CABG x 3; EF 25%  . Carotid artery disease     60-79% LICA  . Allergy to ACE inhibitors     Angioedema  . Renal insufficiency     Past Surgical History  Procedure Date  . Knee arthroscopy ~ 2008    right  . Nasal sinus surgery 1990's    right  . Mitral valve repair 10/17/2011    Procedure: MITRAL VALVE REPAIR (MVR);  Surgeon: Purcell Nails, MD;  Location: [facility] OR;  Service: Open Heart Surgery;  Laterality: N/A;  . Coronary artery bypass graft  10/17/2011    Procedure: CORONARY ARTERY BYPASS GRAFTING (CABG);  Surgeon: Purcell Nails, MD;  Location: [facility] OR;  Service: Open Heart Surgery;  Laterality: N/A;    History   Social History  . Marital Status: Married    Spouse Name: N/A    Number of Children: N/A  . Years of Education: N/A   Occupational History  . Retired    Social History Main Topics  . Smoking status: Never Smoker   . Smokeless tobacco: Never Used  . Alcohol Use: No  . Drug Use: No  . Sexually Active: Yes   Other Topics Concern  . Not on file   Social History Narrative  . No narrative on file    Family History  Problem Relation Age of Onset  . Heart disease Mother     ROS: no nausea, vomiting; no fever, chills; no melena, hematochezia; no claudication  PHYSICAL EXAM: BP 133/71  Pulse 89  Ht 6\' 5"  (1.956 m)  Wt 232 lb (105.235 kg)  BMI 27.51 kg/m2 GENERAL: 78 year-old male, sitting upright; NAD    HEENT: NCAT, PERRLA, EOMI; sclera clear; no xanthelasma  NECK: palpable bilateral carotid pulses, no bruits; no JVD; no TM  LUNGS: Diminished breath sounds in right base; no crackles, wheezes  CARDIAC: Regular rhythm, increased rate (S1, S2); no significant murmurs; no rubs or gallops  ABDOMEN: soft, non-tender; intact BS  EXTREMETIES: 1+ bilateral peripheral edema  SKIN: warm/dry; no obvious rash/lesions  MUSCULOSKELETAL: no joint deformity  NEURO: no focal deficit; NL affect    EKG:    ASSESSMENT & PLAN:

## 2011-11-30 ENCOUNTER — Encounter (HOSPITAL_COMMUNITY)
Admission: RE | Admit: 2011-11-30 | Discharge: 2011-11-30 | Disposition: A | Discharge status: Home / Self Care | Payer: Medicare Other | Source: Ambulatory Visit | Attending: Cardiovascular Disease | Admitting: Cardiovascular Disease

## 2011-11-30 DIAGNOSIS — I252 Old myocardial infarction: Secondary | ICD-10-CM | POA: Insufficient documentation

## 2011-11-30 DIAGNOSIS — I2589 Other forms of chronic ischemic heart disease: Secondary | ICD-10-CM | POA: Insufficient documentation

## 2011-11-30 DIAGNOSIS — I2510 Atherosclerotic heart disease of native coronary artery without angina pectoris: Secondary | ICD-10-CM | POA: Insufficient documentation

## 2011-11-30 DIAGNOSIS — I059 Rheumatic mitral valve disease, unspecified: Secondary | ICD-10-CM | POA: Insufficient documentation

## 2011-11-30 DIAGNOSIS — I5022 Chronic systolic (congestive) heart failure: Secondary | ICD-10-CM | POA: Insufficient documentation

## 2011-11-30 DIAGNOSIS — I509 Heart failure, unspecified: Secondary | ICD-10-CM | POA: Insufficient documentation

## 2011-11-30 DIAGNOSIS — Z5189 Encounter for other specified aftercare: Secondary | ICD-10-CM | POA: Insufficient documentation

## 2011-12-02 ENCOUNTER — Encounter (HOSPITAL_COMMUNITY)
Admission: RE | Admit: 2011-12-02 | Discharge: 2011-12-02 | Disposition: A | Discharge status: Home / Self Care | Payer: Medicare Other | Source: Ambulatory Visit | Attending: Cardiovascular Disease | Admitting: Cardiovascular Disease

## 2011-12-05 ENCOUNTER — Encounter (HOSPITAL_COMMUNITY)
Admission: RE | Admit: 2011-12-05 | Discharge: 2011-12-05 | Disposition: A | Discharge status: Home / Self Care | Payer: Medicare Other | Source: Ambulatory Visit | Attending: Cardiovascular Disease | Admitting: Cardiovascular Disease

## 2011-12-07 ENCOUNTER — Encounter (HOSPITAL_COMMUNITY): Payer: Medicare Other

## 2011-12-07 ENCOUNTER — Encounter: Payer: Self-pay | Admitting: *Deleted

## 2011-12-09 ENCOUNTER — Ambulatory Visit (INDEPENDENT_AMBULATORY_CARE_PROVIDER_SITE_OTHER): Payer: Medicare Other | Admitting: *Deleted

## 2011-12-09 ENCOUNTER — Encounter (HOSPITAL_COMMUNITY): Payer: Medicare Other

## 2011-12-09 DIAGNOSIS — Z9889 Other specified postprocedural states: Secondary | ICD-10-CM

## 2011-12-09 DIAGNOSIS — Z951 Presence of aortocoronary bypass graft: Secondary | ICD-10-CM

## 2011-12-09 DIAGNOSIS — Z7901 Long term (current) use of anticoagulants: Secondary | ICD-10-CM

## 2011-12-09 LAB — POCT INR: INR: 2.6

## 2011-12-12 ENCOUNTER — Encounter (HOSPITAL_COMMUNITY): Payer: Medicare Other

## 2011-12-14 ENCOUNTER — Encounter (HOSPITAL_COMMUNITY): Payer: Medicare Other

## 2011-12-16 ENCOUNTER — Encounter (HOSPITAL_COMMUNITY): Payer: Medicare Other

## 2011-12-16 NOTE — Progress Notes (Signed)
Cardiac Rehabilitation Program Outcomes Report   Orientation:  11/17/2011 Graduate Date:   Discharge Date:12/05/2011 # of sessions completed: 6 [id number] Artery Bypass Graft X3 and Valve repaired  Cardiologist: Kirke Corin Family MD:  Samuel Jester PCP Class Time:  09:30  A.  Exercise Program:  Tolerates exercise @ 2.1 METS for 15 minutes and Discharged  B.  Mental Health:  Good mental attitude  C.  Education/Instruction/Skills  Knows THR for exercise and Uses Perceived Exertion Scale and/or Dyspnea Scale  Uses Perceived Exertion Scale and/or Dyspnea Scale  D.  Nutrition/Weight Control/Body Composition:  Adherence to prescribed nutrition program: good    E.  Blood Lipids    No results found for this basename: CHOL, HDL, LDLCALC, LDLDIRECT, TRIG, CHOLHDL    F.  Lifestyle Changes:  Making positive lifestyle changes  G.  Symptoms noted with exercise:  Asymptomatic  Report Completed By: Lelon Huh. Niva Murren RN  Comments:  Patient did well while in rehab. H achieved a peak mets of 2.1. His resting HR is 84 and his resting BP was 118/62. His peak HR is 101 and his peak BP is 112/62. He attended 6 sessions and never returned.

## 2011-12-19 ENCOUNTER — Encounter (HOSPITAL_COMMUNITY): Payer: Medicare Other

## 2011-12-20 ENCOUNTER — Other Ambulatory Visit: Payer: Self-pay | Admitting: *Deleted

## 2011-12-20 MED ORDER — LOSARTAN POTASSIUM 50 MG PO TABS: 50.0000 mg | ORAL_TABLET | Freq: Every day | ORAL | Status: DC

## 2011-12-21 ENCOUNTER — Encounter (HOSPITAL_COMMUNITY): Payer: Medicare Other

## 2011-12-23 ENCOUNTER — Encounter (HOSPITAL_COMMUNITY): Payer: Medicare Other

## 2011-12-23 ENCOUNTER — Telehealth: Payer: Self-pay | Admitting: *Deleted

## 2011-12-23 DIAGNOSIS — R0602 Shortness of breath: Secondary | ICD-10-CM

## 2011-12-23 MED ORDER — POTASSIUM CHLORIDE CRYS ER 20 MEQ PO TBCR: 20.0000 meq | EXTENDED_RELEASE_TABLET | Freq: Every day | ORAL | Status: DC

## 2011-12-23 MED ORDER — FUROSEMIDE 40 MG PO TABS: 40.0000 mg | ORAL_TABLET | Freq: Every day | ORAL | Status: DC

## 2011-12-23 NOTE — Telephone Encounter (Signed)
Resume Lasix 40 daily/KDUR 20 daily. BMET/BNP in 1 week.

## 2011-12-23 NOTE — Telephone Encounter (Signed)
Koleen Nimrod w/[employer] called regarding pt. Pt's weight is now up 7.5 lbs in 4 days (up an additional 2.5 lbs since last notification). Koleen Nimrod has been unable to reach pt.   Spoke with pt who denies SOB or increased edema. He recently saw primary MD and was told his lungs were ok at that time. Pt has been weighing every morning at 8 am. He is compliant with his medication, but he does not feel his fluid pill is working well. He urinates some but not frequently and it's a weak stream when he does. He is aware a note will be sent to the PA for further review.

## 2011-12-23 NOTE — Telephone Encounter (Signed)
Received a status report faxed from Riley Nearing, Case Manager for [facility] heart failure program. Per this report, pt's weight has increased 5 lbs over 3 days. "His current weight is 233.9 lbs. He is not reporting symptoms per monitor." Koleen Nimrod was unable to reach pt by phone to validate data.    Attempted to reach pt to discuss further. Left message to call back on voicemail.

## 2011-12-23 NOTE — Telephone Encounter (Signed)
Pt notified and verbalized understanding. Order for labs mailed to pt.

## 2011-12-26 ENCOUNTER — Encounter (HOSPITAL_COMMUNITY): Payer: Medicare Other

## 2011-12-26 NOTE — Telephone Encounter (Signed)
Received a call from [place] at [facility] that pt's weight has returned to 230, which is close to his baseline of 228.

## 2011-12-27 ENCOUNTER — Ambulatory Visit (INDEPENDENT_AMBULATORY_CARE_PROVIDER_SITE_OTHER): Payer: Medicare Other | Admitting: *Deleted

## 2011-12-27 DIAGNOSIS — Z951 Presence of aortocoronary bypass graft: Secondary | ICD-10-CM

## 2011-12-27 DIAGNOSIS — Z9889 Other specified postprocedural states: Secondary | ICD-10-CM

## 2011-12-27 DIAGNOSIS — Z7901 Long term (current) use of anticoagulants: Secondary | ICD-10-CM

## 2011-12-27 LAB — POCT INR: INR: 4.7

## 2011-12-28 ENCOUNTER — Encounter (HOSPITAL_COMMUNITY): Payer: Medicare Other

## 2011-12-30 ENCOUNTER — Encounter (HOSPITAL_COMMUNITY): Payer: Medicare Other

## 2012-01-02 ENCOUNTER — Encounter (HOSPITAL_COMMUNITY): Payer: Medicare Other

## 2012-01-04 ENCOUNTER — Encounter (HOSPITAL_COMMUNITY): Payer: Medicare Other

## 2012-01-06 ENCOUNTER — Encounter (HOSPITAL_COMMUNITY): Payer: Medicare Other

## 2012-01-06 ENCOUNTER — Ambulatory Visit (INDEPENDENT_AMBULATORY_CARE_PROVIDER_SITE_OTHER): Payer: Medicare Other | Admitting: *Deleted

## 2012-01-06 DIAGNOSIS — Z9889 Other specified postprocedural states: Secondary | ICD-10-CM

## 2012-01-06 DIAGNOSIS — Z7901 Long term (current) use of anticoagulants: Secondary | ICD-10-CM

## 2012-01-06 DIAGNOSIS — Z951 Presence of aortocoronary bypass graft: Secondary | ICD-10-CM

## 2012-01-06 LAB — POCT INR: INR: 2.3

## 2012-01-09 ENCOUNTER — Encounter (HOSPITAL_COMMUNITY): Payer: Medicare Other

## 2012-01-11 ENCOUNTER — Encounter (HOSPITAL_COMMUNITY): Payer: Medicare Other

## 2012-01-12 ENCOUNTER — Telehealth: Payer: Self-pay | Admitting: *Deleted

## 2012-01-12 ENCOUNTER — Other Ambulatory Visit: Payer: Self-pay | Admitting: Thoracic Surgery (Cardiothoracic Vascular Surgery)

## 2012-01-12 DIAGNOSIS — N289 Disorder of kidney and ureter, unspecified: Secondary | ICD-10-CM

## 2012-01-12 DIAGNOSIS — I10 Essential (primary) hypertension: Secondary | ICD-10-CM

## 2012-01-12 DIAGNOSIS — R7989 Other specified abnormal findings of blood chemistry: Secondary | ICD-10-CM

## 2012-01-12 DIAGNOSIS — I2510 Atherosclerotic heart disease of native coronary artery without angina pectoris: Secondary | ICD-10-CM

## 2012-01-12 MED ORDER — FUROSEMIDE 40 MG PO TABS: 60.0000 mg | ORAL_TABLET | Freq: Every day | ORAL | Status: DC

## 2012-01-12 NOTE — Telephone Encounter (Signed)
Notes Recorded by Lesle Chris, LPN on 0/98/1191 at 12:02 PM Patient notified and verbalized understanding. Will send new rx to [facility] & fax order for labs to [facility] hospital.

## 2012-01-12 NOTE — Telephone Encounter (Signed)
Message copied by Murriel Hopper on Thu Jan 12, 2012 12:02 PM ------      Message from: Prescott Parma C      Created: Mon Jan 09, 2012  4:28 PM       Renal fxn/K stable. BNP approx 1000 (480, 3/12). Increase Lasix to 60 daily, and check BMET/BNP 1 week.

## 2012-01-13 ENCOUNTER — Encounter (HOSPITAL_COMMUNITY): Payer: Medicare Other

## 2012-01-16 ENCOUNTER — Encounter (HOSPITAL_COMMUNITY): Payer: Medicare Other

## 2012-01-16 ENCOUNTER — Encounter: Payer: Self-pay | Admitting: Thoracic Surgery (Cardiothoracic Vascular Surgery)

## 2012-01-16 ENCOUNTER — Ambulatory Visit (INDEPENDENT_AMBULATORY_CARE_PROVIDER_SITE_OTHER): Payer: Medicare Other | Admitting: Thoracic Surgery (Cardiothoracic Vascular Surgery)

## 2012-01-16 ENCOUNTER — Ambulatory Visit
Admission: RE | Admit: 2012-01-16 | Discharge: 2012-01-16 | Disposition: A | Discharge status: Home / Self Care | Payer: Medicare Other | Source: Ambulatory Visit | Attending: Thoracic Surgery (Cardiothoracic Vascular Surgery) | Admitting: Thoracic Surgery (Cardiothoracic Vascular Surgery)

## 2012-01-16 VITALS — BP 107/67 | HR 94 | Resp 20 | Ht 77.0 in | Wt 224.0 lb

## 2012-01-16 DIAGNOSIS — Z9889 Other specified postprocedural states: Secondary | ICD-10-CM

## 2012-01-16 DIAGNOSIS — J9811 Atelectasis: Secondary | ICD-10-CM

## 2012-01-16 DIAGNOSIS — Z951 Presence of aortocoronary bypass graft: Secondary | ICD-10-CM

## 2012-01-16 DIAGNOSIS — J9819 Other pulmonary collapse: Secondary | ICD-10-CM

## 2012-01-16 DIAGNOSIS — I2510 Atherosclerotic heart disease of native coronary artery without angina pectoris: Secondary | ICD-10-CM

## 2012-01-16 NOTE — Progress Notes (Signed)
[address]            [place] [address]          [phone]     CARDIOTHORACIC SURGERY OFFICE NOTE  Referring Provider is Iran Ouch, MD PCP is [name],[name], DO, DO   HPI:  The patient returns for followup status post mitral valve repair and coronary artery bypass grafting x3 on 10/17/2011. He was last seen here in the office on 11/14/2011. Since then he has continued to do very well. He has had his Coumadin dose adjusted and monitored through the [facility]. He reports that it seems has finally gotten under control. Physically the patient has done fine. He denies exertional shortness of breath. He no longer has any significant chest pain other than very mild occasional soreness if he moves suddenly in one direction or the other. He has not had any exertional chest pain. He's not had any tachypalpitations nor dizzy spells. He reports that his blood sugars have been under good control.  He does complain that he still needs Lasix because of the tendency to retain fluid.   Current Outpatient Prescriptions  Medication Sig Dispense Refill  . aspirin 81 MG tablet Take 81 mg by mouth daily.      [name] Kitchen CALCIUM-MAGNESIUM-ZINC PO Take 2 tablets by mouth daily.      . carvedilol (COREG) 12.5 MG tablet Take 1 tablet (12.5 mg total) by mouth 2 (two) times daily.  60 tablet  6  . furosemide (LASIX) 40 MG tablet Take 1.5 tablets (60 mg total) by mouth daily.  45 tablet  6  . losartan (COZAAR) 50 MG tablet Take 1 tablet (50 mg total) by mouth daily.  30 tablet  6  . metFORMIN (GLUCOPHAGE) 850 MG tablet Take 850 mg by mouth 2 (two) times daily with a meal.      . Multiple Vitamins-Minerals (MULTIVITAMINS THER. W/MINERALS) TABS Take 1 tablet by mouth daily.      . niacin (NIASPAN) 1000 MG CR tablet Take 1,000 mg by mouth at bedtime.      . Omega-3 Fatty Acids (FISH OIL) 1000 MG CAPS Take 1 capsule by mouth 2 (two) times daily.      [name] Kitchen omeprazole  (PRILOSEC) 20 MG capsule Take 20 mg by mouth daily.      . potassium chloride SA (K-DUR,KLOR-CON) 20 MEQ tablet Take 1 tablet (20 mEq total) by mouth daily.  30 tablet  6  . rosuvastatin (CRESTOR) 20 MG tablet Take 20 mg by mouth every evening.      [name] Kitchen spironolactone (ALDACTONE) 25 MG tablet Take 1 tablet (25 mg total) by mouth daily.  30 tablet  6  . testosterone cypionate (DEPOTESTOTERONE CYPIONATE) 200 MG/ML injection Inject 200 mg into the muscle every 14 (fourteen) days.      [name] Kitchen warfarin (COUMADIN) 5 MG tablet Take 5 mg by mouth as directed. Take coumadin 1 tablet daily except, 1 1/2 tablets on Fridays and Mondays, next recheck 01/27/12      . DISCONTD: warfarin (COUMADIN) 5 MG tablet Take 1 tablet (5 mg total) by mouth as directed. Take coumadin 1 tablet daily except 1 1/2 tablets on Fridays  45 tablet  3      Physical Exam:   BP 107/67  Pulse 94  Resp 20  Ht 6\' 5"  (1.956 m)  Wt 224 lb (101.606 kg)  BMI 26.56 kg/m2  SpO2 98%  General:  Well-appearing  Chest:   Clear to auscultation with slightly diminished breath sounds right lung base but good aeration on both sides  CV:   Regular rate and rhythm without murmur  Incisions:  Completely healed with stable sternum  Abdomen:  Soft and nontender  Extremities:  Warm and well-perfused  Diagnostic Tests:  Chest x-ray performed today demonstrates clear lung fields with persistent elevation of the right hemidiaphragm and associated mild right lower lobe atelectasis.   Impression:  The patient is doing very well more than 3 months status post mitral valve repair and coronary artery bypass grafting. Clinically he is doing very nicely. He potentially could discontinue Coumadin although his pulse seems irregular in the office today.  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 However, he moves good air on that side and he is completely free of symptoms of shortness of  breath.   Plan:  I've encouraged patient to continue to increase his physical activity without particular limitation at this time. I recommend that he undergo a followup echocardiogram and electrocardiogram at his next office visit with his cardiologist. He is maintaining sinus rhythm I think it would be reasonable to stop Coumadin. We will plan to see him back in 6 months time with a repeat chest x-ray.   Salvatore Decent. Cornelius Moras, MD 01/16/2012 1:56 PM

## 2012-01-18 ENCOUNTER — Encounter (HOSPITAL_COMMUNITY): Payer: Medicare Other

## 2012-01-20 ENCOUNTER — Encounter (HOSPITAL_COMMUNITY): Payer: Medicare Other

## 2012-01-23 ENCOUNTER — Encounter (HOSPITAL_COMMUNITY): Payer: Medicare Other

## 2012-01-25 ENCOUNTER — Encounter (HOSPITAL_COMMUNITY): Payer: Medicare Other

## 2012-01-27 ENCOUNTER — Encounter (HOSPITAL_COMMUNITY): Payer: Medicare Other

## 2012-01-27 ENCOUNTER — Other Ambulatory Visit: Payer: Self-pay | Admitting: Thoracic Surgery (Cardiothoracic Vascular Surgery)

## 2012-01-27 ENCOUNTER — Ambulatory Visit (INDEPENDENT_AMBULATORY_CARE_PROVIDER_SITE_OTHER): Payer: Medicare Other | Admitting: *Deleted

## 2012-01-27 DIAGNOSIS — Z7901 Long term (current) use of anticoagulants: Secondary | ICD-10-CM

## 2012-01-27 DIAGNOSIS — Z9889 Other specified postprocedural states: Secondary | ICD-10-CM

## 2012-01-27 DIAGNOSIS — Z951 Presence of aortocoronary bypass graft: Secondary | ICD-10-CM

## 2012-01-27 DIAGNOSIS — J9819 Other pulmonary collapse: Secondary | ICD-10-CM

## 2012-01-27 LAB — POCT INR: INR: 2.3

## 2012-01-30 ENCOUNTER — Encounter (HOSPITAL_COMMUNITY): Payer: Medicare Other

## 2012-02-01 ENCOUNTER — Encounter (HOSPITAL_COMMUNITY): Payer: Medicare Other

## 2012-02-03 ENCOUNTER — Encounter (HOSPITAL_COMMUNITY): Payer: Medicare Other

## 2012-02-06 ENCOUNTER — Encounter (HOSPITAL_COMMUNITY): Payer: Medicare Other

## 2012-02-08 ENCOUNTER — Encounter (HOSPITAL_COMMUNITY): Payer: Medicare Other

## 2012-02-09 ENCOUNTER — Other Ambulatory Visit (INDEPENDENT_AMBULATORY_CARE_PROVIDER_SITE_OTHER): Payer: Medicare Other

## 2012-02-09 DIAGNOSIS — R0989 Other specified symptoms and signs involving the circulatory and respiratory systems: Secondary | ICD-10-CM

## 2012-02-09 DIAGNOSIS — J9819 Other pulmonary collapse: Secondary | ICD-10-CM

## 2012-02-09 DIAGNOSIS — Z951 Presence of aortocoronary bypass graft: Secondary | ICD-10-CM

## 2012-02-09 DIAGNOSIS — Z9889 Other specified postprocedural states: Secondary | ICD-10-CM

## 2012-02-10 ENCOUNTER — Telehealth: Payer: Self-pay | Admitting: *Deleted

## 2012-02-10 ENCOUNTER — Encounter (HOSPITAL_COMMUNITY): Payer: Medicare Other

## 2012-02-10 DIAGNOSIS — I5022 Chronic systolic (congestive) heart failure: Secondary | ICD-10-CM

## 2012-02-10 NOTE — Telephone Encounter (Signed)
Message copied by Eustace Moore on Fri Feb 10, 2012  2:17 PM ------      Message from: Rande Brunt      Created: Mon Jan 30, 2012  3:54 PM       Creatinine trending up to 1.7, but BNP down to 650 (970). Pt was recently placed on Aldactone; therefore, he should NOT be on any supplemental K. Please ensure that this has since been discontinued. Recommend repeat BMET and BNP level on Wed, 6/5.

## 2012-02-10 NOTE — Telephone Encounter (Signed)
Informed patient of the below information and he states that he has been on K+ since he started lasix and it was given by our office. Message sent to PA for response.

## 2012-02-13 ENCOUNTER — Other Ambulatory Visit: Payer: Self-pay | Admitting: *Deleted

## 2012-02-13 ENCOUNTER — Ambulatory Visit (INDEPENDENT_AMBULATORY_CARE_PROVIDER_SITE_OTHER): Payer: Medicare Other | Admitting: Physician Assistant

## 2012-02-13 DIAGNOSIS — R0989 Other specified symptoms and signs involving the circulatory and respiratory systems: Secondary | ICD-10-CM

## 2012-02-13 DIAGNOSIS — I5022 Chronic systolic (congestive) heart failure: Secondary | ICD-10-CM

## 2012-02-13 NOTE — Telephone Encounter (Signed)
Patient informed and said he can go for repeat labs on Friday. Lab order faxed to [facility] lab.

## 2012-02-13 NOTE — Telephone Encounter (Signed)
Please DC KDUR, since he has been placed on Aldactone. Needs f/u BMET

## 2012-02-13 NOTE — Telephone Encounter (Signed)
Left message for patient to call office.  

## 2012-02-13 NOTE — Telephone Encounter (Signed)
Patient returning Wayne Gordon's call.  Patient is confused on instructions regarding medication and blood test results.  Please call back at 3:00 pm or after as he is going out and will not be able to take a call.

## 2012-02-16 ENCOUNTER — Ambulatory Visit: Payer: Medicare Other | Admitting: Physician Assistant

## 2012-02-22 ENCOUNTER — Other Ambulatory Visit: Payer: Self-pay

## 2012-02-22 ENCOUNTER — Other Ambulatory Visit (INDEPENDENT_AMBULATORY_CARE_PROVIDER_SITE_OTHER): Payer: Medicare Other

## 2012-02-22 DIAGNOSIS — Z9889 Other specified postprocedural states: Secondary | ICD-10-CM

## 2012-02-22 DIAGNOSIS — I2510 Atherosclerotic heart disease of native coronary artery without angina pectoris: Secondary | ICD-10-CM

## 2012-02-22 DIAGNOSIS — J9819 Other pulmonary collapse: Secondary | ICD-10-CM

## 2012-02-22 DIAGNOSIS — I5022 Chronic systolic (congestive) heart failure: Secondary | ICD-10-CM

## 2012-02-23 ENCOUNTER — Telehealth: Payer: Self-pay | Admitting: *Deleted

## 2012-02-23 NOTE — Telephone Encounter (Signed)
Notes Recorded by Lesle Chris, LPN on 4/54/0981 at 10:55 AM Kendal Hymen (wife) notified of below. Informed that MD can explain in further detail at OV on July 11.
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
## 2012-02-23 NOTE — Telephone Encounter (Signed)
Message copied by Lesle Chris on Thu Feb 23, 2012 10:56 AM ------      Message from: Prescott Parma C      Created: Wed Feb 22, 2012  1:08 PM       EF 20-25%. S/p MV ring repair, with mild residual MR. Review results at next OV

## 2012-02-24 ENCOUNTER — Ambulatory Visit (INDEPENDENT_AMBULATORY_CARE_PROVIDER_SITE_OTHER): Payer: Medicare Other | Admitting: *Deleted

## 2012-02-24 DIAGNOSIS — Z951 Presence of aortocoronary bypass graft: Secondary | ICD-10-CM

## 2012-02-24 DIAGNOSIS — Z9889 Other specified postprocedural states: Secondary | ICD-10-CM

## 2012-02-24 DIAGNOSIS — Z7901 Long term (current) use of anticoagulants: Secondary | ICD-10-CM

## 2012-03-08 ENCOUNTER — Encounter: Payer: Self-pay | Admitting: Physician Assistant

## 2012-03-08 ENCOUNTER — Ambulatory Visit (INDEPENDENT_AMBULATORY_CARE_PROVIDER_SITE_OTHER): Payer: Medicare Other | Admitting: Physician Assistant

## 2012-03-08 VITALS — BP 112/64 | HR 76 | Ht 77.0 in | Wt 221.0 lb

## 2012-03-08 DIAGNOSIS — I255 Ischemic cardiomyopathy: Secondary | ICD-10-CM

## 2012-03-08 DIAGNOSIS — Z7901 Long term (current) use of anticoagulants: Secondary | ICD-10-CM

## 2012-03-08 DIAGNOSIS — I5022 Chronic systolic (congestive) heart failure: Secondary | ICD-10-CM

## 2012-03-08 DIAGNOSIS — I2589 Other forms of chronic ischemic heart disease: Secondary | ICD-10-CM

## 2012-03-08 DIAGNOSIS — I509 Heart failure, unspecified: Secondary | ICD-10-CM

## 2012-03-08 MED ORDER — ASPIRIN 325 MG PO TBEC: 325.0000 mg | DELAYED_RELEASE_TABLET | Freq: Every day | ORAL | Status: DC

## 2012-03-08 MED ORDER — CARVEDILOL 12.5 MG PO TABS: 18.7500 mg | ORAL_TABLET | Freq: Two times a day (BID) | ORAL | Status: DC

## 2012-03-08 NOTE — Assessment & Plan Note (Signed)
Coumadin will be discontinued, given that patient remains in NSR, verified by EKG. As noted, will increase ASA to 325 daily in the interim for one full year, then back to 81 mg daily, indefinitely.

## 2012-03-08 NOTE — Patient Instructions (Signed)
   Stop Coumadin  Increase Aspirin to 325mg  daily (x 1 full year since bypass)  Increase Coreg to 18.75mg  twice a day    Blood pressure & heart rate - will check in 2 weeks at [facility] and call result to office (will call to office instead of coming for nurse visit to avoid another $40 co-pay so soon). Your physician wants you to follow up in: 6 months.  You will receive a reminder letter in the mail one-two months in advance.  If you don't receive a letter, please call our office to schedule the follow up appointment

## 2012-03-08 NOTE — Progress Notes (Signed)
Primary Cardiologist: Mady Gemma, MD   HPI: Patient presents for scheduled followup.  A repeat 2-D echo, June 26: EF 20-25%, status post MV annuloplasty with mild MR.  Most recent labs, June 21: Potassium 4.9, BUN 37, creatinine 1.4 (1.7), and BNP 320 (650).  Clinically, he continues to do well since undergoing CABG/MV annuloplasty, this past February. He is enrolled in cardiac rehabilitation 3 times a week, and plays golf twice a week. He denies orthopnea, PND, significant DOE, or worsening LE edema. He denies exertional CP, tachycardia palpitations.  EKG not indicated NSR at 84 bpm.  Allergies  Allergen Reactions  . Penicillins Palpitations    Current Outpatient Prescriptions  Medication Sig Dispense Refill  . aspirin 81 MG tablet Take 81 mg by mouth daily.      [name] Kitchen CALCIUM-MAGNESIUM-ZINC PO Take 2 tablets by mouth daily.      . carvedilol (COREG) 12.5 MG tablet Take 1 tablet (12.5 mg total) by mouth 2 (two) times daily.  60 tablet  6  . furosemide (LASIX) 40 MG tablet Take 1.5 tablets (60 mg total) by mouth daily.  45 tablet  6  . losartan (COZAAR) 50 MG tablet Take 1 tablet (50 mg total) by mouth daily.  30 tablet  6  . metFORMIN (GLUCOPHAGE) 850 MG tablet Take 850 mg by mouth 2 (two) times daily with a meal.      . Multiple Vitamins-Minerals (MULTIVITAMINS THER. W/MINERALS) TABS Take 1 tablet by mouth daily.      . niacin (NIASPAN) 1000 MG CR tablet Take 1,000 mg by mouth at bedtime.      . Omega-3 Fatty Acids (FISH OIL) 1000 MG CAPS Take 1 capsule by mouth 2 (two) times daily.      [name] Kitchen omeprazole (PRILOSEC) 20 MG capsule Take 20 mg by mouth daily.      . rosuvastatin (CRESTOR) 20 MG tablet Take 20 mg by mouth every evening.      [name] Kitchen spironolactone (ALDACTONE) 25 MG tablet Take 1 tablet (25 mg total) by mouth daily.  30 tablet  6  . testosterone cypionate (DEPOTESTOTERONE CYPIONATE) 200 MG/ML injection Inject 200 mg into the muscle every 14 (fourteen) days.      [name] Kitchen warfarin  (COUMADIN) 5 MG tablet Take 5 mg by mouth as directed. Take coumadin 1 tablet daily except, 1 1/2 tablets on Fridays and Mondays, next recheck 01/27/12        Past Medical History  Diagnosis Date  . Hypertension   . Hyperlipidemia   . Heart murmur   . Shortness of breath     "lying down & w/exertion"  . Diabetes mellitus   . GERD (gastroesophageal reflux disease)   . Arthritis   . Chronic systolic heart failure   . Mitral regurgitation     S/P mitral valve repair [[id number]][  . Ischemic cardiomyopathy     S/P CABG x 3; EF 25%  . Carotid artery disease     60-79% LICA  . Allergy to ACE inhibitors     Angioedema  . Renal insufficiency     Past Surgical History  Procedure Date  . Knee arthroscopy ~ 2008    right  . Nasal sinus surgery 1990's    right  . Mitral valve repair 10/17/2011    Procedure: MITRAL VALVE REPAIR (MVR);  Surgeon: Purcell Nails, MD;  Location: [facility] OR;  Service: Open Heart Surgery;  Laterality: N/A;  . Coronary artery bypass graft 10/17/2011    Procedure: CORONARY ARTERY BYPASS  GRAFTING (CABG);  Surgeon: Purcell Nails, MD;  Location: [facility] OR;  Service: Open Heart Surgery;  Laterality: N/A;    History   Social History  . Marital Status: Married    Spouse Name: N/A    Number of Children: N/A  . Years of Education: N/A   Occupational History  . Retired    Social History Main Topics  . Smoking status: Never Smoker   . Smokeless tobacco: Never Used  . Alcohol Use: No  . Drug Use: No  . Sexually Active: Yes   Other Topics Concern  . Not on file   Social History Narrative  . No narrative on file    Family History  Problem Relation Age of Onset  . Heart disease Mother     ROS: no nausea, vomiting; no fever, chills; no melena, hematochezia; no claudication  PHYSICAL EXAM: BP 112/64  Pulse 76  Ht 6\' 5"  (1.956 m)  Wt 221 lb (100.245 kg)  BMI 26.21 kg/m2  SpO2 97% GENERAL: 78 rolled male, sitting upright; NAD HEENT: NCAT, PERRLA, EOMI;  sclera clear; no xanthelasma NECK: palpable bilateral carotid pulses, no bruits; no JVD; no TM LUNGS: CTA bilaterally CARDIAC: RRR (S1, S2); soft, 2/6 systolic ejection murmur at base; no rubs or gallops ABDOMEN: soft, non-tender; intact BS EXTREMETIES: intact distal pulses; trace peripheral edema SKIN: warm/dry; no obvious rash/lesions MUSCULOSKELETAL: no joint deformity NEURO: no focal deficit; NL affect   EKG: reviewed and available in Electronic Records   ASSESSMENT & PLAN:  Ischemic cardiomyopathy Patient continues to do well since undergoing 3 vessel CABG/MV anuloplasty, this past February. He is enrolled in cardiac rehabilitation, and also plays golf twice weekly. He has excellent BP and HR control, current medication regimen. He is in NSR by EKG and, therefore, we will discontinue Coumadin, as recently recommended by Dr. Cornelius Moras. Consequently, will uptitrate ASA to 325 mg daily for one full year postop, then back to 81 mg daily, indefinitely. We'll also uptitrate carvedilol to 18.75 twice a day, with target 25 twice a day, if BP/HR allows. Will schedule RN visit in 2 weeks for vitals check. Of note, recent followup echo showed no significant improvement in LVF (EF 20-25%), with mild MR. Given this finding, we broached the issue of possible ICD implantation in the near future, for prophylactic treatment of possible SCD. After conferring with Dr Kirke Corin regarding this option, we recommended proceeding now with a formal evaluation by our EP team, regarding feasibility of ICD implantation.  Chronic systolic heart failure Euvolemic by history and exam. Patient has lost an additional 11 pounds, since last OV. Recent labs stable with potassium 4.9, creatinine 1.5, and BNP 320. Continue current dose of Lasix and Aldactone.  Encounter for long-term (current) use of anticoagulants Coumadin will be discontinued, given that patient remains in NSR, verified by EKG. As noted, will increase ASA to 325  daily in the interim for one full year, then back to 81 mg daily, indefinitely.    Gene Sameria Morss, PAC

## 2012-03-08 NOTE — Assessment & Plan Note (Signed)
Euvolemic by history and exam. Patient has lost an additional 11 pounds, since last OV. Recent labs stable with potassium 4.9, creatinine 1.5, and BNP 320. Continue current dose of Lasix and Aldactone.

## 2012-03-08 NOTE — Assessment & Plan Note (Addendum)
Patient continues to do well since undergoing 3 vessel CABG/MV anuloplasty, this past February. He is enrolled in cardiac rehabilitation, and also plays golf twice weekly. He has excellent BP and HR control, current medication regimen. He is in NSR by EKG and, therefore, we will discontinue Coumadin, as recently recommended by Dr. Cornelius Moras. Consequently, will uptitrate ASA to 325 mg daily for one full year postop, then back to 81 mg daily, indefinitely. We'll also uptitrate carvedilol to 18.75 twice a day, with target 25 twice a day, if BP/HR allows. Will schedule RN visit in 2 weeks for vitals check. Of note, recent followup echo showed no significant improvement in LVF (EF 20-25%), with mild MR. We broached the issue of possible ICD implantation in the near future, if there is no significant improvement in LVF.

## 2012-03-09 ENCOUNTER — Ambulatory Visit: Payer: Self-pay | Admitting: *Deleted

## 2012-03-09 DIAGNOSIS — Z9889 Other specified postprocedural states: Secondary | ICD-10-CM

## 2012-03-09 DIAGNOSIS — Z951 Presence of aortocoronary bypass graft: Secondary | ICD-10-CM

## 2012-03-09 DIAGNOSIS — Z7901 Long term (current) use of anticoagulants: Secondary | ICD-10-CM

## 2012-03-12 ENCOUNTER — Other Ambulatory Visit: Payer: Self-pay | Admitting: *Deleted

## 2012-03-12 DIAGNOSIS — R931 Abnormal findings on diagnostic imaging of heart and coronary circulation: Secondary | ICD-10-CM

## 2012-03-22 ENCOUNTER — Other Ambulatory Visit: Payer: Self-pay | Admitting: *Deleted

## 2012-03-22 ENCOUNTER — Ambulatory Visit (INDEPENDENT_AMBULATORY_CARE_PROVIDER_SITE_OTHER): Payer: Medicare Other | Admitting: Internal Medicine

## 2012-03-22 ENCOUNTER — Encounter: Payer: Self-pay | Admitting: Internal Medicine

## 2012-03-22 ENCOUNTER — Encounter: Payer: Self-pay | Admitting: *Deleted

## 2012-03-22 VITALS — BP 120/68 | HR 77 | Ht 77.0 in | Wt 224.4 lb

## 2012-03-22 DIAGNOSIS — I2589 Other forms of chronic ischemic heart disease: Secondary | ICD-10-CM

## 2012-03-22 DIAGNOSIS — I10 Essential (primary) hypertension: Secondary | ICD-10-CM

## 2012-03-22 DIAGNOSIS — I519 Heart disease, unspecified: Secondary | ICD-10-CM

## 2012-03-22 DIAGNOSIS — I255 Ischemic cardiomyopathy: Secondary | ICD-10-CM

## 2012-03-22 DIAGNOSIS — I5022 Chronic systolic (congestive) heart failure: Secondary | ICD-10-CM

## 2012-03-22 LAB — CBC WITH DIFFERENTIAL/PLATELET
Basophils Absolute: 0 10*3/uL (ref 0.0–0.1)
Eosinophils Absolute: 0.1 10*3/uL (ref 0.0–0.7)
Eosinophils Relative: 1.5 % (ref 0.0–5.0)
MCV: 78.2 fl (ref 78.0–100.0)
Monocytes Absolute: 0.8 10*3/uL (ref 0.1–1.0)
Neutrophils Relative %: 69.7 % (ref 43.0–77.0)
Platelets: 222 10*3/uL (ref 150.0–400.0)
WBC: 8.8 10*3/uL (ref 4.5–10.5)

## 2012-03-22 LAB — BASIC METABOLIC PANEL
BUN: 28 mg/dL — ABNORMAL HIGH (ref 6–23)
Chloride: 97 mEq/L (ref 96–112)
Creatinine, Ser: 1.5 mg/dL (ref 0.4–1.5)

## 2012-03-22 NOTE — Assessment & Plan Note (Signed)
He currently denies anginal symptoms. He will continue his current medical therapy in his current active lifestyle.

## 2012-03-22 NOTE — Assessment & Plan Note (Signed)
His ejection fraction is 25%, along with class II congestive heart failure, despite maximal medical therapy. I've discussed the treatment options with the patient. His QRS duration is 130 ms. I've recommended a single chamber ICD. The risk, goals, benefits, and expectations, of the procedure have been discussed and he wishes to proceed.

## 2012-03-22 NOTE — Progress Notes (Signed)
HPI Wayne Gordon is referred today by Gene Serpe for evaluation of and consideration for ICD implant. The patient has an ischemic cardiomyopathy dating back over a year. He sustained at least one, possibly more out of hospital myocardial infarction. He developed mitral regurgitation. He underwent bypass surgery and valve repair back in February. Repeat 2-D echo one month ago despite maximal medical therapy demonstrated an ejection fraction of 25%. He has class II congestive heart failure symptoms. He has a QRS duration of 130 ms. He is still able to play golf and exercises regularly. He denies syncope. No peripheral edema. No chest pain. Allergies  Allergen Reactions  . Penicillins Palpitations     Current Outpatient Prescriptions  Medication Sig Dispense Refill  . aspirin EC 325 MG EC tablet Take 1 tablet (325 mg total) by mouth daily.      . CALCIUM-MAGNESIUM-ZINC PO Take 2 tablets by mouth daily.      . carvedilol (COREG) 12.5 MG tablet Take 1.5 tablets (18.75 mg total) by mouth 2 (two) times daily.  90 tablet  6  . furosemide (LASIX) 40 MG tablet Take 1.5 tablets (60 mg total) by mouth daily.  45 tablet  6  . losartan (COZAAR) 50 MG tablet Take 1 tablet (50 mg total) by mouth daily.  30 tablet  6  . metFORMIN (GLUCOPHAGE) 850 MG tablet Take 850 mg by mouth 2 (two) times daily with a meal.      . Multiple Vitamins-Minerals (MULTIVITAMINS THER. W/MINERALS) TABS Take 1 tablet by mouth daily.      . niacin (NIASPAN) 1000 MG CR tablet Take 1,000 mg by mouth at bedtime.      . Omega-3 Fatty Acids (FISH OIL) 1000 MG CAPS Take 1 capsule by mouth 2 (two) times daily.      . omeprazole (PRILOSEC) 20 MG capsule Take 20 mg by mouth daily.      . rosuvastatin (CRESTOR) 20 MG tablet Take 20 mg by mouth every evening.      . spironolactone (ALDACTONE) 25 MG tablet Take 1 tablet (25 mg total) by mouth daily.  30 tablet  6  . testosterone cypionate (DEPOTESTOTERONE CYPIONATE) 200 MG/ML injection Inject 200  mg into the muscle every 14 (fourteen) days.         Past Medical History  Diagnosis Date  . Hypertension   . Hyperlipidemia   . Heart murmur   . Shortness of breath     "lying down & w/exertion"  . Diabetes mellitus   . GERD (gastroesophageal reflux disease)   . Arthritis   . Chronic systolic heart failure   . Mitral regurgitation     S/P mitral valve repair [[id number]][  . Ischemic cardiomyopathy     S/P CABG x 3; EF 25%  . Carotid artery disease     60-79% LICA  . Allergy to ACE inhibitors     Angioedema  . Renal insufficiency     ROS:   All systems reviewed and negative except as noted in the HPI.   Past Surgical History  Procedure Date  . Knee arthroscopy ~ 2008    right  . Nasal sinus surgery 1990's    right  . Mitral valve repair 10/17/2011    Procedure: MITRAL VALVE REPAIR (MVR);  Surgeon: Clarence H Owen, MD;  Location: [facility] OR;  Service: Open Heart Surgery;  Laterality: N/A;  . Coronary artery bypass graft 10/17/2011    Procedure: CORONARY ARTERY BYPASS GRAFTING (CABG);  Surgeon: Clarence H Owen,   MD;  Location: [facility] OR;  Service: Open Heart Surgery;  Laterality: N/A;     Family History  Problem Relation Age of Onset  . Heart disease Mother      History   Social History  . Marital Status: Married    Spouse Name: N/A    Number of Children: N/A  . Years of Education: N/A   Occupational History  . Retired    Social History Main Topics  . Smoking status: Never Smoker   . Smokeless tobacco: Never Used  . Alcohol Use: No  . Drug Use: No  . Sexually Active: Yes   Other Topics Concern  . Not on file   Social History Narrative  . No narrative on file     BP 120/68  Pulse 77  Ht 6' 5" (1.956 m)  Wt 224 lb 6.4 oz (101.787 kg)  BMI 26.61 kg/m2  SpO2 98%  Physical Exam:  Well appearing 78-year-old man, NAD HEENT: Unremarkable Neck:  7 cm JVD, no thyromegally Lungs:  Clear with no wheezes, rales, or rhonchi. HEART:  Regular rate rhythm, no  murmurs, no rubs, no clicks Abd:  soft, positive bowel sounds, no organomegally, no rebound, no guarding Ext:  2 plus pulses, no edema, no cyanosis, no clubbing Skin:  No rashes no nodules Neuro:  CN II through XII intact, motor grossly intact  EKG Normal sinus rhythm, QRS duration 130 ms, intraventricular conduction delay.  Assess/Plan:   

## 2012-03-22 NOTE — Assessment & Plan Note (Signed)
His blood pressure today is well controlled. He'll continue his current medical therapy and maintain a low-sodium diet. 

## 2012-03-22 NOTE — Patient Instructions (Signed)

## 2012-03-23 ENCOUNTER — Telehealth: Payer: Self-pay | Admitting: *Deleted

## 2012-03-23 ENCOUNTER — Encounter (HOSPITAL_COMMUNITY): Payer: Self-pay | Admitting: Pharmacy Technician

## 2012-03-23 NOTE — Telephone Encounter (Signed)
noted 

## 2012-03-23 NOTE — Telephone Encounter (Signed)
Patient calling office with BP & HR reading - 102/57  77  - taken this morning.  Feeling okay.  Saw Dr. Ladona Ridgel yesterday & plans to have ICD placed next Wednesday.

## 2012-03-27 MED ORDER — SODIUM CHLORIDE 0.45 % IV SOLN
INTRAVENOUS | Status: DC
  Administered 2012-03-28: 50 mL/h via INTRAVENOUS

## 2012-03-27 MED ORDER — VANCOMYCIN HCL IN DEXTROSE 1-5 GM/200ML-% IV SOLN
1000.0000 mg | INTRAVENOUS | Status: DC
  Filled 2012-03-27: qty 200

## 2012-03-27 MED ORDER — CHLORHEXIDINE GLUCONATE 4 % EX LIQD
60.0000 mL | Freq: Once | CUTANEOUS | Status: DC
  Filled 2012-03-27: qty 60

## 2012-03-27 MED ORDER — SODIUM CHLORIDE 0.9 % IV SOLN: 250.0000 mL | INTRAVENOUS | Status: DC

## 2012-03-27 MED ORDER — SODIUM CHLORIDE 0.9 % IJ SOLN: 3.0000 mL | Freq: Two times a day (BID) | INTRAMUSCULAR | Status: DC

## 2012-03-27 MED ORDER — GENTAMICIN SULFATE 40 MG/ML IJ SOLN
80.0000 mg | INTRAMUSCULAR | Status: DC
  Filled 2012-03-27: qty 2

## 2012-03-27 MED ORDER — SODIUM CHLORIDE 0.9 % IJ SOLN: 3.0000 mL | INTRAMUSCULAR | Status: DC | PRN

## 2012-03-28 ENCOUNTER — Encounter (HOSPITAL_COMMUNITY): Payer: Self-pay | Admitting: General Practice

## 2012-03-28 ENCOUNTER — Ambulatory Visit (HOSPITAL_COMMUNITY)
Admission: RE | Admit: 2012-03-28 | Discharge: 2012-03-29 | Disposition: A | Discharge status: Home / Self Care | Payer: Medicare Other | Source: Ambulatory Visit | Attending: Internal Medicine | Admitting: Internal Medicine

## 2012-03-28 ENCOUNTER — Encounter (HOSPITAL_COMMUNITY)
Admission: RE | Disposition: A | Discharge status: Home / Self Care | Payer: Self-pay | Source: Ambulatory Visit | Attending: Internal Medicine

## 2012-03-28 DIAGNOSIS — K219 Gastro-esophageal reflux disease without esophagitis: Secondary | ICD-10-CM | POA: Insufficient documentation

## 2012-03-28 DIAGNOSIS — Z952 Presence of prosthetic heart valve: Secondary | ICD-10-CM | POA: Insufficient documentation

## 2012-03-28 DIAGNOSIS — I6529 Occlusion and stenosis of unspecified carotid artery: Secondary | ICD-10-CM | POA: Insufficient documentation

## 2012-03-28 DIAGNOSIS — I255 Ischemic cardiomyopathy: Secondary | ICD-10-CM

## 2012-03-28 DIAGNOSIS — I519 Heart disease, unspecified: Secondary | ICD-10-CM

## 2012-03-28 DIAGNOSIS — Z95810 Presence of automatic (implantable) cardiac defibrillator: Secondary | ICD-10-CM

## 2012-03-28 DIAGNOSIS — E785 Hyperlipidemia, unspecified: Secondary | ICD-10-CM | POA: Insufficient documentation

## 2012-03-28 DIAGNOSIS — I5022 Chronic systolic (congestive) heart failure: Secondary | ICD-10-CM

## 2012-03-28 DIAGNOSIS — N289 Disorder of kidney and ureter, unspecified: Secondary | ICD-10-CM | POA: Insufficient documentation

## 2012-03-28 DIAGNOSIS — I2589 Other forms of chronic ischemic heart disease: Secondary | ICD-10-CM | POA: Insufficient documentation

## 2012-03-28 DIAGNOSIS — I10 Essential (primary) hypertension: Secondary | ICD-10-CM | POA: Insufficient documentation

## 2012-03-28 DIAGNOSIS — E119 Type 2 diabetes mellitus without complications: Secondary | ICD-10-CM | POA: Insufficient documentation

## 2012-03-28 DIAGNOSIS — I509 Heart failure, unspecified: Secondary | ICD-10-CM | POA: Insufficient documentation

## 2012-03-28 HISTORY — DX: Heart failure, unspecified: I50.9

## 2012-03-28 HISTORY — DX: Presence of automatic (implantable) cardiac defibrillator: Z95.810

## 2012-03-28 HISTORY — DX: Acute myocardial infarction, unspecified: I21.9

## 2012-03-28 HISTORY — DX: Atherosclerotic heart disease of native coronary artery without angina pectoris: I25.10

## 2012-03-28 HISTORY — PX: OTHER SURGICAL HISTORY: SHX169

## 2012-03-28 HISTORY — PX: IMPLANTABLE CARDIOVERTER DEFIBRILLATOR IMPLANT: SHX5473

## 2012-03-28 LAB — GLUCOSE, CAPILLARY: Glucose-Capillary: 183 mg/dL — ABNORMAL HIGH (ref 70–99)

## 2012-03-28 LAB — SURGICAL PCR SCREEN: MRSA, PCR: NEGATIVE

## 2012-03-28 SURGERY — IMPLANTABLE CARDIOVERTER DEFIBRILLATOR IMPLANT
Anesthesia: LOCAL

## 2012-03-28 MED ORDER — ATORVASTATIN CALCIUM 10 MG PO TABS
10.0000 mg | ORAL_TABLET | Freq: Every day | ORAL | Status: DC
  Administered 2012-03-28: 10 mg via ORAL
  Filled 2012-03-28 (×2): qty 1

## 2012-03-28 MED ORDER — ASPIRIN EC 325 MG PO TBEC
325.0000 mg | DELAYED_RELEASE_TABLET | Freq: Every day | ORAL | Status: DC
  Administered 2012-03-28: 325 mg via ORAL
  Filled 2012-03-28 (×2): qty 1

## 2012-03-28 MED ORDER — FENTANYL CITRATE 0.05 MG/ML IJ SOLN
INTRAMUSCULAR | Status: AC
  Filled 2012-03-28: qty 2

## 2012-03-28 MED ORDER — MIDAZOLAM HCL 5 MG/5ML IJ SOLN
INTRAMUSCULAR | Status: AC
  Filled 2012-03-28: qty 5

## 2012-03-28 MED ORDER — TESTOSTERONE CYPIONATE 200 MG/ML IM SOLN: 200.0000 mg | INTRAMUSCULAR | Status: DC

## 2012-03-28 MED ORDER — LOSARTAN POTASSIUM 50 MG PO TABS
50.0000 mg | ORAL_TABLET | Freq: Every day | ORAL | Status: DC
  Administered 2012-03-28: 50 mg via ORAL
  Filled 2012-03-28 (×2): qty 1

## 2012-03-28 MED ORDER — SODIUM CHLORIDE 0.9 % IV SOLN
2000.0000 mg | Freq: Two times a day (BID) | INTRAVENOUS | Status: AC
  Administered 2012-03-29: 2000 mg via INTRAVENOUS
  Filled 2012-03-28: qty 2000

## 2012-03-28 MED ORDER — ACETAMINOPHEN 325 MG PO TABS
325.0000 mg | ORAL_TABLET | ORAL | Status: DC | PRN
  Administered 2012-03-29: 650 mg via ORAL
  Filled 2012-03-28: qty 2

## 2012-03-28 MED ORDER — HEPARIN (PORCINE) IN NACL 2-0.9 UNIT/ML-% IJ SOLN
INTRAMUSCULAR | Status: AC
  Filled 2012-03-28: qty 1000

## 2012-03-28 MED ORDER — VANCOMYCIN HCL IN DEXTROSE 1-5 GM/200ML-% IV SOLN
1000.0000 mg | INTRAVENOUS | Status: DC
  Filled 2012-03-28: qty 200

## 2012-03-28 MED ORDER — CARVEDILOL 6.25 MG PO TABS
18.7500 mg | ORAL_TABLET | Freq: Two times a day (BID) | ORAL | Status: DC
  Administered 2012-03-28 – 2012-03-29 (×2): 18.75 mg via ORAL
  Filled 2012-03-28 (×4): qty 1

## 2012-03-28 MED ORDER — ONDANSETRON HCL 4 MG/2ML IJ SOLN: 4.0000 mg | Freq: Four times a day (QID) | INTRAMUSCULAR | Status: DC | PRN

## 2012-03-28 MED ORDER — SODIUM CHLORIDE 0.9 % IR SOLN
Freq: Once | Status: DC
  Filled 2012-03-28: qty 2

## 2012-03-28 MED ORDER — SPIRONOLACTONE 25 MG PO TABS
25.0000 mg | ORAL_TABLET | Freq: Every day | ORAL | Status: DC
  Administered 2012-03-29: 25 mg via ORAL
  Filled 2012-03-28: qty 1

## 2012-03-28 MED ORDER — MUPIROCIN 2 % EX OINT
TOPICAL_OINTMENT | Freq: Two times a day (BID) | CUTANEOUS | Status: DC
  Administered 2012-03-28: 1 via NASAL
  Filled 2012-03-28: qty 22

## 2012-03-28 MED ORDER — FUROSEMIDE 40 MG PO TABS
60.0000 mg | ORAL_TABLET | Freq: Every day | ORAL | Status: DC
  Administered 2012-03-28 – 2012-03-29 (×2): 60 mg via ORAL
  Filled 2012-03-28 (×2): qty 1

## 2012-03-28 MED ORDER — PANTOPRAZOLE SODIUM 40 MG PO TBEC
40.0000 mg | DELAYED_RELEASE_TABLET | Freq: Every day | ORAL | Status: DC
  Administered 2012-03-29: 40 mg via ORAL
  Filled 2012-03-28: qty 1

## 2012-03-28 MED ORDER — METFORMIN HCL 850 MG PO TABS
850.0000 mg | ORAL_TABLET | Freq: Two times a day (BID) | ORAL | Status: DC
  Administered 2012-03-29: 850 mg via ORAL
  Filled 2012-03-28 (×3): qty 1

## 2012-03-28 MED ORDER — MUPIROCIN 2 % EX OINT
TOPICAL_OINTMENT | CUTANEOUS | Status: AC
  Filled 2012-03-28: qty 22

## 2012-03-28 MED ORDER — LIDOCAINE HCL (PF) 1 % IJ SOLN
INTRAMUSCULAR | Status: AC
  Filled 2012-03-28: qty 60

## 2012-03-28 NOTE — H&P (View-Only) (Signed)
HPI [name] is referred today by Wayne Gordon for evaluation of and consideration for ICD implant. The patient has an ischemic cardiomyopathy dating back over a year. He sustained at least one, possibly more out of hospital myocardial infarction. He developed mitral regurgitation. He underwent bypass surgery and valve repair back in February. Repeat 2-D echo one month ago despite maximal medical therapy demonstrated an ejection fraction of 25%. He has class II congestive heart failure symptoms. He has a QRS duration of 130 ms. He is still able to play golf and exercises regularly. He denies syncope. No peripheral edema. No chest pain. Allergies  Allergen Reactions  . Penicillins Palpitations     Current Outpatient Prescriptions  Medication Sig Dispense Refill  . aspirin EC 325 MG EC tablet Take 1 tablet (325 mg total) by mouth daily.      [name] Kitchen CALCIUM-MAGNESIUM-ZINC PO Take 2 tablets by mouth daily.      . carvedilol (COREG) 12.5 MG tablet Take 1.5 tablets (18.75 mg total) by mouth 2 (two) times daily.  90 tablet  6  . furosemide (LASIX) 40 MG tablet Take 1.5 tablets (60 mg total) by mouth daily.  45 tablet  6  . losartan (COZAAR) 50 MG tablet Take 1 tablet (50 mg total) by mouth daily.  30 tablet  6  . metFORMIN (GLUCOPHAGE) 850 MG tablet Take 850 mg by mouth 2 (two) times daily with a meal.      . Multiple Vitamins-Minerals (MULTIVITAMINS THER. W/MINERALS) TABS Take 1 tablet by mouth daily.      . niacin (NIASPAN) 1000 MG CR tablet Take 1,000 mg by mouth at bedtime.      . Omega-3 Fatty Acids (FISH OIL) 1000 MG CAPS Take 1 capsule by mouth 2 (two) times daily.      [name] Kitchen omeprazole (PRILOSEC) 20 MG capsule Take 20 mg by mouth daily.      . rosuvastatin (CRESTOR) 20 MG tablet Take 20 mg by mouth every evening.      [name] Kitchen spironolactone (ALDACTONE) 25 MG tablet Take 1 tablet (25 mg total) by mouth daily.  30 tablet  6  . testosterone cypionate (DEPOTESTOTERONE CYPIONATE) 200 MG/ML injection Inject 200  mg into the muscle every 14 (fourteen) days.         Past Medical History  Diagnosis Date  . Hypertension   . Hyperlipidemia   . Heart murmur   . Shortness of breath     "lying down & w/exertion"  . Diabetes mellitus   . GERD (gastroesophageal reflux disease)   . Arthritis   . Chronic systolic heart failure   . Mitral regurgitation     S/P mitral valve repair [[id number]][  . Ischemic cardiomyopathy     S/P CABG x 3; EF 25%  . Carotid artery disease     60-79% LICA  . Allergy to ACE inhibitors     Angioedema  . Renal insufficiency     ROS:   All systems reviewed and negative except as noted in the HPI.   Past Surgical History  Procedure Date  . Knee arthroscopy ~ 2008    right  . Nasal sinus surgery 1990's    right  . Mitral valve repair 10/17/2011    Procedure: MITRAL VALVE REPAIR (MVR);  Surgeon: Purcell Nails, MD;  Location: [facility] OR;  Service: Open Heart Surgery;  Laterality: N/A;  . Coronary artery bypass graft 10/17/2011    Procedure: CORONARY ARTERY BYPASS GRAFTING (CABG);  Surgeon: Purcell Nails,  MD;  Location: [facility] OR;  Service: Open Heart Surgery;  Laterality: N/A;     Family History  Problem Relation Age of Onset  . Heart disease Mother      History   Social History  . Marital Status: Married    Spouse Name: N/A    Number of Children: N/A  . Years of Education: N/A   Occupational History  . Retired    Social History Main Topics  . Smoking status: Never Smoker   . Smokeless tobacco: Never Used  . Alcohol Use: No  . Drug Use: No  . Sexually Active: Yes   Other Topics Concern  . Not on file   Social History Narrative  . No narrative on file     BP 120/68  Pulse 77  Ht 6\' 5"  (1.956 m)  Wt 224 lb 6.4 oz (101.787 kg)  BMI 26.61 kg/m2  SpO2 98%  Physical Exam:  Well appearing 78 year old man, NAD HEENT: Unremarkable Neck:  7 cm JVD, no thyromegally Lungs:  Clear with no wheezes, rales, or rhonchi. HEART:  Regular rate rhythm, no  murmurs, no rubs, no clicks Abd:  soft, positive bowel sounds, no organomegally, no rebound, no guarding Ext:  2 plus pulses, no edema, no cyanosis, no clubbing Skin:  No rashes no nodules Neuro:  CN II through XII intact, motor grossly intact  EKG Normal sinus rhythm, QRS duration 130 ms, intraventricular conduction delay.  Assess/Plan:

## 2012-03-28 NOTE — Op Note (Signed)
VDD ICD implanted via the left subclavian vein without immediate complication.Z#[id number].

## 2012-03-28 NOTE — Interval H&P Note (Signed)
History and Physical Interval Note:  03/28/2012 2:00 PM  Wayne Gordon  has presented today for surgery, with the diagnosis of Heart block  The various methods of treatment have been discussed with the patient and family. After consideration of risks, benefits and other options for treatment, the patient has consented to  Procedure(s) (LRB): IMPLANTABLE CARDIOVERTER DEFIBRILLATOR IMPLANT (N/A) as a surgical intervention .  The patient's history has been reviewed, patient examined, no change in status, stable for surgery.  I have reviewed the patient's chart and labs.  Questions were answered to the patient's satisfaction.     Lewayne Bunting

## 2012-03-29 ENCOUNTER — Ambulatory Visit (HOSPITAL_COMMUNITY): Payer: Medicare Other

## 2012-03-29 DIAGNOSIS — I5022 Chronic systolic (congestive) heart failure: Secondary | ICD-10-CM

## 2012-03-29 DIAGNOSIS — I2589 Other forms of chronic ischemic heart disease: Secondary | ICD-10-CM

## 2012-03-29 LAB — GLUCOSE, CAPILLARY: Glucose-Capillary: 138 mg/dL — ABNORMAL HIGH (ref 70–99)

## 2012-03-29 NOTE — Op Note (Signed)
Wayne Gordon, Wayne Gordon                ACCOUNT NO.:  [id number]  MEDICAL RECORD NO.:  [id number]  LOCATION:  [id number]                        FACILITY:  [facility]  PHYSICIAN:  Doylene Canning. Ladona Ridgel, MD    DATE OF BIRTH:  1934/07/07  DATE OF PROCEDURE:  03/28/2012 DATE OF DISCHARGE:                              OPERATIVE REPORT   PROCEDURE PERFORMED:  Insertion of a single-chamber defibrillator with VDD pacing and sensing capabilities.  INTRODUCTION:  The patient is a very pleasant 78 year old male with a longstanding ischemic cardiomyopathy, chronic systolic heart failure, ejection fraction 25%, status post MI.  He is on maximal medical therapy.  He is now referred for ICD implantation.  PROCEDURE:  After informed was obtained, the patient was taken to the diagnostic EP lab in a fasting state.  After usual preparation and draping, intravenous fentanyl and midazolam was given for sedation.  A 30 mL of lidocaine was infiltrated into the left infraclavicular region. A 6-cm incision was carried out over this region.  Electrocautery was utilized to dissect down to the fascial plane.  The left subclavian vein was punctured and the Biotronik Linoxsmart S DX defibrillation lead, serial [id number] was advanced into the right ventricle near the RV apical septum.  The R-waves in this location with 12 mV and with active fixation of the lead, there was a large injury current.  The pace impedance was 690 ohms and a threshold 0.5 V at 0.4 msec.  10 V pacing did not stimulate the diaphragm.  With the ventricular lead in place, attention was then placed towards the P-waves on the atrial sensing portion of the lead.  These measured 2.5 to 3 mV through the analyzer. With these satisfactory parameters, the lead was secured to the subpectoral fascia with a figure-of-eight silk suture and the sewing sleeve was secured with silk suture.  Electrocautery was utilized to make subcutaneous pocket.  Antibiotic irrigation was  utilized to irrigate the pocket.  Electrocautery was utilized to assure hemostasis. The Biotronik Lumax 740 VR-T DX single chamber defibrillator, serial [id number] was connected to the defibrillation lead and placed back in the subcutaneous pocket.  Generous secured with silk suture.  The pocket was irrigated with antibiotic irrigation and the incision was closed with 2-0 and 3-0 Vicryl.  At this point, I scrubbed out of the case to supervise defibrillation threshold testing.  After the patient more deeply sedated with fentanyl and Versed under my direct supervision, ventricular fibrillation was induced with a T-wave shock.  A 20 joule shock was delivered, which terminated ventricular fibrillation with appropriate sensing.  Benzoin and Steri-Strips were painted on the skin, pressure dressing was applied, and the patient was returned to his room in satisfactory condition.  COMPLICATIONS:  There were no immediate procedure complications.  RESULTS:  Demonstrate successful implantation of a Biotronik VDD single chamber defibrillator in the patient with an ischemic cardiomyopathy and chronic systolic heart failure.     Doylene Canning. Ladona Ridgel, MD     GWT/MEDQ  D:  03/28/2012  T:  03/29/2012  Job:  [id number]

## 2012-03-29 NOTE — Discharge Summary (Signed)
ELECTROPHYSIOLOGY PROCEDURE DISCHARGE SUMMARY    Patient ID: Wayne Gordon,  MRN: [id number], DOB/AGE: 1934-07-27 78 y.o.  Admit date: 03/28/2012 Discharge date: 03/29/2012  Primary Care Physician: Samuel Jester, DO Primary Cardiologist: Lewayne Bunting, MD  Primary Discharge Diagnosis:  Ischemic cardiomyopathy status post ICD implantation this admission.   Secondary Discharge Diagnosis:  1.  Hypertension 2.  Hyperlipidemia 3.  Diabetes 4.  GERD 5.  Arthritis 6.  Chronic systolic heart failure- class II 7.  Mitral regurgitation status post MVR (09/2011) 8.  Coronary artery disease status post CABG (09/2011) 9.  Carotid artery disease- 60-79% LICA 10.  Renal insufficiency  Procedures This Admission:  1.  Implantation of a Biotronik implantable cardiac defibrillator on 03-28-2012 by Dr Ladona Ridgel.  The patient received a single chamber ICD with VDD pacing and sensing capabilities.  ICD is a BTK Lumax 740 VR-T DX; RV lead Linoxsmart S DX.  DFT's at time of implant were successful at 20J.  There were no early apparent complications.  2.  CXR on 03-29-2012 demonstrated no pneumothorax status post device implantation.   Brief HPI: Wayne Gordon was referred by Gene Serpe for evaluation of and consideration for ICD implant. The patient has an ischemic cardiomyopathy dating back over a year. He sustained at least one, possibly more out of hospital myocardial infarction. He developed mitral regurgitation. He underwent bypass surgery and valve repair back in February. Repeat 2-D echo one month ago despite maximal medical therapy demonstrated an ejection fraction of 25%. He has class II congestive heart failure symptoms. He has a QRS duration of 130 ms. He is still able to play golf and exercises regularly. He denies syncope. No peripheral edema. No chest pain.  Risks, benefits, and alternatives to ICD implantation were reviewed with the patient who wished to proceed.  Hospital Course:  The patient was  admitted and underwent implantation of a Biotronik ICD with details as outlined above.   He was monitored on telemetry overnight which demonstrated sinus rhythm.  Left chest was without hematoma or ecchymosis.  The device was interrogated and found to be functioning normally.  CXR was obtained and demonstrated no pneumothorax status post device implantation.  Wound care, arm mobility, and restrictions were reviewed with the patient.  Dr Johney Frame examined the patient and considered them stable for discharge to home.    Discharge Vitals: Blood pressure 129/81, pulse 80, temperature 98 F (36.7 C), temperature source Oral, resp. rate 18, height 6\' 5"  (1.956 m), weight 218 lb (98.884 kg), SpO2 98.00%.    Labs:   Lab Results  Component Value Date   WBC 8.8 03/22/2012   HGB 12.1* 03/22/2012   HCT 38.6* 03/22/2012   MCV 78.2 03/22/2012   PLT 222.0 03/22/2012     Lab 03/22/12 1310  NA 135  K 4.2  CL 97  CO2 30  BUN 28*  CREATININE 1.5  CALCIUM 9.1  PROT --  BILITOT --  ALKPHOS --  ALT --  AST --  GLUCOSE 130*   Discharge Medications:  Medication List  As of 03/29/2012  9:30 AM   TAKE these medications         aspirin 325 MG EC tablet   Take 1 tablet (325 mg total) by mouth daily.      CALCIUM-MAGNESIUM-ZINC PO   Take 2 tablets by mouth daily.      carvedilol 12.5 MG tablet   Commonly known as: COREG   Take 1.5 tablets (18.75 mg total) by mouth  2 (two) times daily.      Fish Oil 1000 MG Caps   Take 1 capsule by mouth 2 (two) times daily.      furosemide 40 MG tablet   Commonly known as: LASIX   Take 1.5 tablets (60 mg total) by mouth daily.      losartan 50 MG tablet   Commonly known as: COZAAR   Take 1 tablet (50 mg total) by mouth daily.      metFORMIN 850 MG tablet   Commonly known as: GLUCOPHAGE   Take 850 mg by mouth 2 (two) times daily with a meal.      multivitamins ther. w/minerals Tabs   Take 1 tablet by mouth daily.      niacin 1000 MG CR tablet   Commonly  known as: NIASPAN   Take 1,000 mg by mouth at bedtime.      omeprazole 20 MG capsule   Commonly known as: PRILOSEC   Take 20 mg by mouth daily.      rosuvastatin 20 MG tablet   Commonly known as: CRESTOR   Take 20 mg by mouth every evening.      spironolactone 25 MG tablet   Commonly known as: ALDACTONE   Take 1 tablet (25 mg total) by mouth daily.      testosterone cypionate 200 MG/ML injection   Commonly known as: DEPOTESTOTERONE CYPIONATE   Inject 200 mg into the muscle every 14 (fourteen) days.            Disposition:  Discharge Orders    Future Appointments: Provider: Department: Dept Phone: Center:   04/09/2012 12:30 PM Minda Meo, PA-C [facility] [phone] [facility]   07/02/2012 9:00 AM Marinus Maw, MD [facility] [phone] [facility]   07/16/2012 10:30 AM Purcell Nails, MD [facility] [phone] [facility]     Future Orders Please Complete By Expires   Diet - low sodium heart healthy      Increase activity slowly      Discharge instructions      Comments:   **PLEASE REMEMBER TO BRING ALL OF YOUR MEDICATIONS TO EACH OF YOUR FOLLOW-UP OFFICE VISITS.     Follow-up Information    Follow up with EDMISTEN, BROOKE, PA-C on 04/09/2012. (Device/Wound Check @ 12:30)    Contact information:   [facility] [address] [place] [address] [phone]       Follow up with Lewayne Bunting, MD on 07/02/2012. (9:00)    Contact information:   [facility] [address] [place] [address] [phone]          Duration of Discharge Encounter: Greater than 30 minutes including physician time.  Signed, Gypsy Balsam, RN, BSN 03/29/2012, 9:30 AM   I have seen, examined the patient, and reviewed the above assessment and plan.  Changes to above are made where necessary.    Co Sign: Hillis Range, MD 03/29/2012 9:09 PM

## 2012-03-29 NOTE — Progress Notes (Signed)
   ELECTROPHYSIOLOGY ROUNDING NOTE    Patient Name: Wayne Gordon Date of Encounter: 03-29-2012    SUBJECTIVE:Patient feels well.  No chest pain or shortness of breath.  Minimal incisional soreness, relieved with Tylenol.  S/p VDD ICD 03-28-2012  TELEMETRY: Reviewed telemetry pt in sinus rhythm with occasional ventricular pacing Filed Vitals:   03/28/12 1830 03/28/12 1930 03/28/12 2000 03/29/12 0500  BP: 121/67 104/64 104/64 129/81  Pulse: 78 71 56 80  Temp:   98 F (36.7 C) 98 F (36.7 C)  TempSrc:   Oral Oral  Resp:   18 18  Height:      Weight:      SpO2:   95% 98%    Intake/Output Summary (Last 24 hours) at 03/29/12 0737 Last data filed at 03/29/12 0258  Gross per 24 hour  Intake      0 ml  Output    500 ml  Net   -500 ml   Radiology/Studies:  Pending  PHYSICAL EXAM Left chest without hematoma or ecchymosis.  GEN- The patient is well appearing, alert and oriented x 3 today.   Head- normocephalic, atraumatic Lungs- Clear to ausculation bilaterally, normal work of breathing Heart- Regular rate and rhythm, no murmurs, rubs or gallops, PMI not laterally displaced GI- soft, NT, ND, + BS Extremities- no clubbing, cyanosis, or edema Neuro- strength and sensation are intact    DEVICE INTERROGATION: Device interrogated by industry.  Lead values including impedence, sensing, threshold within normal values.  Device programmed VF only zone at 194bpm with intervals of 24/30.   Assessment and Plan: 1. Ischemic CM/ chronic systolic dysfunction  Wound care, arm mobility, shock plan, restrictions reviewed with patient.    Routine follow up scheduled (wound check in [place] then will follow in [place] with Dr Ladona Ridgel)  DC to home today if chest xray is ok  Fayrene Fearing Eamon Tantillo,MD

## 2012-03-30 ENCOUNTER — Encounter: Payer: Self-pay | Admitting: *Deleted

## 2012-03-30 DIAGNOSIS — Z95810 Presence of automatic (implantable) cardiac defibrillator: Secondary | ICD-10-CM | POA: Insufficient documentation

## 2012-04-09 ENCOUNTER — Encounter: Payer: Self-pay | Admitting: Internal Medicine

## 2012-04-09 ENCOUNTER — Ambulatory Visit (INDEPENDENT_AMBULATORY_CARE_PROVIDER_SITE_OTHER): Payer: Medicare Other | Admitting: Cardiology

## 2012-04-09 ENCOUNTER — Encounter: Payer: Self-pay | Admitting: Cardiology

## 2012-04-09 VITALS — BP 118/72 | HR 84 | Ht 77.0 in | Wt 222.0 lb

## 2012-04-09 DIAGNOSIS — Z95810 Presence of automatic (implantable) cardiac defibrillator: Secondary | ICD-10-CM

## 2012-04-09 DIAGNOSIS — I2589 Other forms of chronic ischemic heart disease: Secondary | ICD-10-CM

## 2012-04-09 DIAGNOSIS — I255 Ischemic cardiomyopathy: Secondary | ICD-10-CM

## 2012-04-09 LAB — ICD DEVICE OBSERVATION
AL AMPLITUDE: 3.2 mv
BRDY-0002RV: 50 {beats}/min
DEVICE MODEL ICD: 60687186
HV IMPEDENCE: 67 Ohm
RV LEAD AMPLITUDE: 17.3 mv

## 2012-04-09 NOTE — Progress Notes (Signed)
Device check only. See PaceArt report. 

## 2012-04-27 ENCOUNTER — Institutional Professional Consult (permissible substitution): Payer: Medicare Other | Admitting: Internal Medicine

## 2012-06-26 ENCOUNTER — Telehealth: Payer: Self-pay | Admitting: Internal Medicine

## 2012-06-26 NOTE — Telephone Encounter (Signed)
plz return call to pt [phone] regarding low blood pressure readings, he can be reached at hM#

## 2012-06-27 NOTE — Telephone Encounter (Signed)
Left message to return call 

## 2012-06-27 NOTE — Telephone Encounter (Signed)
Discussed below with patient.  Stated that BP was 92/60 a couple of days ago after playing golf.  States PMD advised him to cut back on Lasix, that he may be too dry.  Wanted to check to see if okay with [place].  Also, states PMD office has been checking BP every 2 weeks recently.    Advised patient that we would be happy to see him in office to evaluate, but hard to give advice based off of one reading.  Also, informed him that only one provider should manage so that directions do not get misunderstood or confused.  Patient verbalized understanding.  Has device check scheduled for 11/4 with Dr. Ladona Ridgel & Dr. Cornelius Moras on 11/18.  States he will discuss with Dr. Ladona Ridgel as he thought that he told him that he could manage all of his cardiac issues.  Patient will call back if need routine cardiac appointment.

## 2012-07-02 ENCOUNTER — Encounter: Payer: Self-pay | Admitting: Internal Medicine

## 2012-07-02 ENCOUNTER — Ambulatory Visit (INDEPENDENT_AMBULATORY_CARE_PROVIDER_SITE_OTHER): Payer: Medicare Other | Admitting: Internal Medicine

## 2012-07-02 VITALS — BP 121/77 | HR 77 | Ht 77.0 in | Wt 226.0 lb

## 2012-07-02 DIAGNOSIS — Z95810 Presence of automatic (implantable) cardiac defibrillator: Secondary | ICD-10-CM

## 2012-07-02 DIAGNOSIS — I5022 Chronic systolic (congestive) heart failure: Secondary | ICD-10-CM

## 2012-07-02 DIAGNOSIS — I255 Ischemic cardiomyopathy: Secondary | ICD-10-CM

## 2012-07-02 DIAGNOSIS — I5021 Acute systolic (congestive) heart failure: Secondary | ICD-10-CM

## 2012-07-02 DIAGNOSIS — I2589 Other forms of chronic ischemic heart disease: Secondary | ICD-10-CM

## 2012-07-02 DIAGNOSIS — I10 Essential (primary) hypertension: Secondary | ICD-10-CM

## 2012-07-02 LAB — ICD DEVICE OBSERVATION
BRDY-0002RV: 50 {beats}/min
DEVICE MODEL ICD: 60687186
RV LEAD IMPEDENCE ICD: 664 Ohm
VENTRICULAR PACING ICD: 7 pct

## 2012-07-02 MED ORDER — CARVEDILOL 12.5 MG PO TABS: 18.7500 mg | ORAL_TABLET | Freq: Two times a day (BID) | ORAL | Status: DC

## 2012-07-02 MED ORDER — SPIRONOLACTONE 25 MG PO TABS: 25.0000 mg | ORAL_TABLET | Freq: Every day | ORAL | Status: DC

## 2012-07-02 MED ORDER — FUROSEMIDE 40 MG PO TABS: 60.0000 mg | ORAL_TABLET | Freq: Every day | ORAL | Status: DC

## 2012-07-02 NOTE — Progress Notes (Signed)
HPI Mr. Marxen returns today for followup. He is a very pleasant 78 year old man with an ischemic cardiomyopathy, chronic well compensated systolic heart failure, mild renal insufficiency, stage II, diabetes, and hypertension. He has done well in the interim, status post ICD implantation. No ICD shocks. No syncope, and no peripheral edema. He is exercising 4 times a week. He plays golf twice a week. Allergies  Allergen Reactions  . Penicillins Palpitations     Current Outpatient Prescriptions  Medication Sig Dispense Refill  . aspirin EC 325 MG EC tablet Take 1 tablet (325 mg total) by mouth daily.      [name] Kitchen CALCIUM-MAGNESIUM-ZINC PO Take 2 tablets by mouth daily.      . carvedilol (COREG) 12.5 MG tablet Take 1.5 tablets (18.75 mg total) by mouth 2 (two) times daily.  90 tablet  6  . furosemide (LASIX) 40 MG tablet Take 1.5 tablets (60 mg total) by mouth daily.  45 tablet  6  . losartan (COZAAR) 50 MG tablet Take 1 tablet (50 mg total) by mouth daily.  30 tablet  6  . metFORMIN (GLUCOPHAGE) 850 MG tablet Take 850 mg by mouth 2 (two) times daily with a meal.      . Multiple Vitamins-Minerals (MULTIVITAMINS THER. W/MINERALS) TABS Take 1 tablet by mouth daily.      . niacin (NIASPAN) 1000 MG CR tablet Take 1,000 mg by mouth at bedtime.      . Omega-3 Fatty Acids (FISH OIL) 1000 MG CAPS Take 1 capsule by mouth 2 (two) times daily.      [name] Kitchen omeprazole (PRILOSEC) 20 MG capsule Take 20 mg by mouth daily.      . rosuvastatin (CRESTOR) 20 MG tablet Take 20 mg by mouth every evening.      [name] Kitchen spironolactone (ALDACTONE) 25 MG tablet Take 1 tablet (25 mg total) by mouth daily.  30 tablet  6  . testosterone cypionate (DEPOTESTOTERONE CYPIONATE) 200 MG/ML injection Inject 200 mg into the muscle every 14 (fourteen) days.         Past Medical History  Diagnosis Date  . Hypertension   . Hyperlipidemia   . Heart murmur   . Shortness of breath     "lying down & w/exertion"  . Diabetes mellitus   . GERD  (gastroesophageal reflux disease)   . Arthritis   . Chronic systolic heart failure   . Mitral regurgitation     S/P mitral valve repair [[id number]][  . Ischemic cardiomyopathy     S/P CABG x 3; EF 25%  . Carotid artery disease     60-79% LICA  . Allergy to ACE inhibitors     Angioedema  . Renal insufficiency   . Coronary artery disease   . Myocardial infarction   . Dysrhythmia     heart block  . ICD (implantable cardiac defibrillator) in place 03/28/2012  . CHF (congestive heart failure)     ROS:   All systems reviewed and negative except as noted in the HPI.   Past Surgical History  Procedure Date  . Knee arthroscopy ~ 2008    right  . Nasal sinus surgery 1990's    right  . Mitral valve repair 10/17/2011    Procedure: MITRAL VALVE REPAIR (MVR);  Surgeon: Purcell Nails, MD;  Location: [facility] OR;  Service: Open Heart Surgery;  Laterality: N/A;  . Coronary artery bypass graft 10/17/2011    Procedure: CORONARY ARTERY BYPASS GRAFTING (CABG);  Surgeon: Purcell Nails, MD;  Location: [facility]  OR;  Service: Open Heart Surgery;  Laterality: N/A;  . Icd 03/28/2012     Family History  Problem Relation Age of Onset  . Heart disease Mother      History   Social History  . Marital Status: Married    Spouse Name: N/A    Number of Children: N/A  . Years of Education: N/A   Occupational History  . Retired    Social History Main Topics  . Smoking status: Never Smoker   . Smokeless tobacco: Never Used  . Alcohol Use: No  . Drug Use: No  . Sexually Active: Yes   Other Topics Concern  . Not on file   Social History Narrative  . No narrative on file     BP 121/77  Pulse 77  Ht 6\' 5"  (1.956 m)  Wt 226 lb 0.6 oz (102.531 kg)  BMI 26.80 kg/m2  Physical Exam:  Well appearing44 year old man, NAD HEENT: Unremarkable Neck:  No JVD, no thyromegally Lungs:  Clear HEART:  Regular rate rhythm, no murmurs, no rubs, no clicks Abd:  soft, positive bowel sounds, no organomegally, no  rebound, no guarding Ext:  2 plus pulses, no edema, no cyanosis, no clubbing Skin:  No rashes no nodules Neuro:  CN II through XII intact, motor grossly intact  DEVICE  Normal device function.  See PaceArt for details.   Assess/Plan:

## 2012-07-02 NOTE — Assessment & Plan Note (Signed)
His blood pressure is well controlled. He will continue his current medical therapy. 

## 2012-07-02 NOTE — Addendum Note (Signed)
Addended by: Reather Laurence A on: 07/02/2012 10:09 AM   Modules accepted: Orders

## 2012-07-02 NOTE — Assessment & Plan Note (Signed)
His Biotronik defibrillator is working normally. There is appropriate dual-chamber sensing and single-chamber pacing. We'll plan to recheck in several months.

## 2012-07-02 NOTE — Assessment & Plan Note (Signed)
His chronic systolic heart failure is well compensated. We discussed regular daily weights, and the possibility of reducing his diuretic dosing. His dry weight is approximately 215 pounds, and at 222 pounds, he begins to become edematous. Adjustments of his daily diuretics were discussed. Also, the importance of a low sodium diet was addressed.

## 2012-07-10 ENCOUNTER — Other Ambulatory Visit: Payer: Self-pay | Admitting: Physician Assistant

## 2012-07-13 ENCOUNTER — Other Ambulatory Visit: Payer: Self-pay | Admitting: Thoracic Surgery (Cardiothoracic Vascular Surgery)

## 2012-07-13 DIAGNOSIS — Z9889 Other specified postprocedural states: Secondary | ICD-10-CM

## 2012-07-16 ENCOUNTER — Ambulatory Visit (INDEPENDENT_AMBULATORY_CARE_PROVIDER_SITE_OTHER): Payer: Medicare Other | Admitting: Thoracic Surgery (Cardiothoracic Vascular Surgery)

## 2012-07-16 ENCOUNTER — Ambulatory Visit
Admission: RE | Admit: 2012-07-16 | Discharge: 2012-07-16 | Disposition: A | Discharge status: Home / Self Care | Payer: Medicare Other | Source: Ambulatory Visit | Attending: Thoracic Surgery (Cardiothoracic Vascular Surgery) | Admitting: Thoracic Surgery (Cardiothoracic Vascular Surgery)

## 2012-07-16 ENCOUNTER — Encounter: Payer: Self-pay | Admitting: Thoracic Surgery (Cardiothoracic Vascular Surgery)

## 2012-07-16 VITALS — BP 113/70 | HR 80 | Resp 18 | Ht 77.0 in | Wt 228.0 lb

## 2012-07-16 DIAGNOSIS — Z951 Presence of aortocoronary bypass graft: Secondary | ICD-10-CM

## 2012-07-16 DIAGNOSIS — Z9889 Other specified postprocedural states: Secondary | ICD-10-CM

## 2012-07-16 NOTE — Progress Notes (Signed)
[address]            [place] [address]          [phone]     CARDIOTHORACIC SURGERY OFFICE NOTE  Referring Provider is Iran Ouch, MD PCP is Samuel Jester, DO   HPI:  Patient returns for followup status post coronary artery bypass grafting x3 with mitral ring annuloplasty on 10/17/2011 for severe three-vessel coronary artery disease with severe ischemic mitral regurgitation and ischemic cardiomyopathy.  He has done quite well postoperatively. Followup echocardiogram performed last June demonstrated intact mitral valve repair with only mild residual regurgitation and low transvalvular gradient. Left ventricular systolic function remained low with ejection fraction estimated 20-25%, similar to out appeared prior to surgery. The patient subsequently underwent placement of ICD by Dr. Ladona Ridgel. Since then the patient has continued to do exceptionally well. He exercises on a regular basis at the [facility] and reports excellent exercise tolerance. His breathing is much better than it was prior to surgery. His defibrillator has never fired. He has no chest pain. He denies shortness of breath.   Current Outpatient Prescriptions  Medication Sig Dispense Refill  . aspirin EC 325 MG EC tablet Take 1 tablet (325 mg total) by mouth daily.      [name] Kitchen CALCIUM-MAGNESIUM-ZINC PO Take 2 tablets by mouth daily.      . carvedilol (COREG) 12.5 MG tablet Take 1.5 tablets (18.75 mg total) by mouth 2 (two) times daily.  90 tablet  6  . furosemide (LASIX) 40 MG tablet Take 40 mg by mouth daily.      [name] Kitchen losartan (COZAAR) 50 MG tablet Take 1 tablet (50 mg total) by mouth daily.  30 tablet  6  . metFORMIN (GLUCOPHAGE) 850 MG tablet Take 850 mg by mouth 2 (two) times daily with a meal.      . Multiple Vitamins-Minerals (MULTIVITAMINS THER. W/MINERALS) TABS Take 1 tablet by mouth daily.      . niacin (NIASPAN) 1000 MG CR tablet Take 1,000 mg by mouth at bedtime.      . Omega-3  Fatty Acids (FISH OIL) 1000 MG CAPS Take 1 capsule by mouth 2 (two) times daily.      [name] Kitchen omeprazole (PRILOSEC) 20 MG capsule Take 20 mg by mouth daily.      . rosuvastatin (CRESTOR) 20 MG tablet Take 20 mg by mouth every evening.      [name] Kitchen spironolactone (ALDACTONE) 25 MG tablet Take 1 tablet (25 mg total) by mouth daily.  30 tablet  6  . testosterone cypionate (DEPOTESTOTERONE CYPIONATE) 200 MG/ML injection Inject 200 mg into the muscle every 14 (fourteen) days.      . [DISCONTINUED] furosemide (LASIX) 40 MG tablet Take 1.5 tablets (60 mg total) by mouth daily.  45 tablet  6  . [DISCONTINUED] spironolactone (ALDACTONE) 25 MG tablet TAKE 1 TABLET DAILY  30 tablet  3      Physical Exam:   BP 113/70  Pulse 80  Resp 18  Ht 6\' 5"  (1.956 m)  Wt 228 lb (103.42 kg)  BMI 27.04 kg/m2  SpO2 97%  General:  Well-appearing  Chest:   Clear to auscultation with symmetrical breath sounds  CV:   Regular rate and rhythm without murmur  Incisions:  Completely healed with stable sternum  Abdomen:  Soft and nontender  Extremities:  Warm and well-perfused with no lower extremity edema  Diagnostic Tests:  Transthoracic Echocardiography  Patient:    Wayne Gordon, Wayne Gordon MR #:       [id number] Study Date: 02/22/2012 Gender:     M Age:        78 Height:     195.6cm Weight:     101.6kg BSA:        2.78m^2 Pt. Status: Room:    ORDERING     Tressie Stalker, MD  ATTENDING    Prescott Parma  SONOGRAPHER  [facility]  REFERRING    Earnestine Leys, Guy  PERFORMING   Willeen Niece cc: Samuel Jester, DO  ------------------------------------------------------------ LV EF: 20% -   25%  ------------------------------------------------------------ Indications:      CAD of native vessels 414.01.  ------------------------------------------------------------ History:   PMH:  Acquired from the patient and from the patient's chart.  Dyspnea and murmur.  Coronary artery disease.  Risk factors:  Hypertension. Diabetes  mellitus.   ------------------------------------------------------------ Study Conclusions  - Left ventricle: The cavity size was mildly dilated. Wall   thickness was increased in a pattern of mild LVH. Systolic   function was severely reduced. The estimated ejection   fraction was in the range of 20% to 25%. There is akinesis   of the inferior and inferoseptal myocardium with otherwise   severe diffuse hypokinesis. Features are consistent with a   pseudonormal left ventricular filling pattern, with   concomitant abnormal relaxation and increased filling   pressure (grade 2 diastolic dysfunction). Doppler   parameters are consistent with elevated mean left atrial   filling pressure. - Aortic valve: Moderately calcified annulus extends to LVOT   on some views. Mildly calcified leaflets. There was no   stenosis. Mild regurgitation. Mean gradient: 3mm Hg (S). - Mitral valve: Mildly thickened leaflets. Evidence of   annuloplasty repair. Mild regurgitation. Mean gradient:   3mm Hg (D). - Left atrium: The atrium was mildly dilated. - Right ventricle: The cavity size was mildly dilated.   Systolic function was mildly to moderately reduced. - Tricuspid valve: Mild regurgitation. Peak RV-RA gradient:   25mm Hg (S). - Pericardium, extracardiac: There was no pericardial   effusion.  ------------------------------------------------------------ Labs, prior tests, procedures, and surgery: Elective coronary artery bypass grafting (October 17, 2011), for congestive heart failure.     Mitral valve repair.  Transthoracic echocardiography.  M-mode, complete 2D, spectral Doppler, and color Doppler.  Height:  Height: 195.6cm. Height: 77in.  Weight:  Weight: 101.6kg. Weight: 223.5lb.  Body mass index:  BMI: 26.6kg/m^2.  Body surface area:    BSA: 2.95m^2.  Blood pressure:     110/66.  Patient status:  Outpatient.  Location:  [facility].  ------------------------------------------------------------  ------------------------------------------------------------ Left ventricle:  The cavity size was mildly dilated. Wall thickness was increased in a pattern of mild LVH. Systolic function was severely reduced. The estimated ejection fraction was in the range of 20% to 25%.  Regional wall motion abnormalities:  There is akinesis of the inferior and inferoseptal myocardium with otherwise severe diffuse hypokinesis. Features are consistent with a pseudonormal left ventricular filling pattern, with concomitant abnormal relaxation and increased filling pressure (grade 2 diastolic dysfunction). Doppler parameters are consistent with elevated mean left atrial filling pressure.  ------------------------------------------------------------ Aortic valve:  Moderately calcified annulus extends to LVOT on some views. Mildly calcified leaflets.  Doppler:   There was no stenosis.    Mild regurgitation.    VTI ratio of LVOT to aortic valve: 0.68. Valve area: 2.81cm^2(VTI). Indexed valve area: 1.2cm^2/m^2 (VTI). Peak velocity  ratio of LVOT to aortic valve: 0.7. Valve area: 2.91cm^2 (Vmax). Indexed valve area: 1.24cm^2/m^2 (Vmax).    Mean gradient: 3mm Hg (S).  ------------------------------------------------------------ Aorta:  Aortic root: The aortic root was normal in size.  ------------------------------------------------------------ Mitral valve:  Mildly thickened leaflets. Evidence of annuloplasty repair.  Doppler:   Mild regurgitation. Valve area by continuity equation (using LVOT flow): 2.19cm^2. Indexed valve area by continuity equation (using LVOT flow): 0.93cm^2/m^2.    Mean gradient: 3mm Hg (D). Peak gradient: 9mm Hg (D).  ------------------------------------------------------------ Left atrium:  The atrium was mildly dilated.  ------------------------------------------------------------ Right ventricle:   The cavity size was mildly dilated. Systolic function was mildly to moderately reduced.  ------------------------------------------------------------ Pulmonic valve:   Poorly visualized.  The valve appears to be grossly normal.    Doppler:   Trivial regurgitation.  ------------------------------------------------------------ Tricuspid valve:   The valve appears to be grossly normal.  Doppler:   Mild regurgitation.  ------------------------------------------------------------ Right atrium:  The atrium was normal in size.  ------------------------------------------------------------ Pericardium:  There was no pericardial effusion.  ------------------------------------------------------------ Systemic veins: Inferior vena cava: Not visualized.  ------------------------------------------------------------  2D measurements        Normal  Doppler measurements   Normal Left ventricle                 LVOT LVID ED,   57.8 mm     43-52   Peak vel,  82.7 cm/s   ------ chord,                         S PLAX                           VTI, S     16.3 cm     ------ LVID ES,   48.7 mm     23-38   Aortic valve chord,                         Peak vel,   118 cm/s   ------ PLAX                           S FS, chord,   16 %      >29     Mean vel,  83.8 cm/s   ------ PLAX                           S LVPW, ED   11.2 mm     ------  VTI, S     24.1 cm     ------ IVS/LVPW   1.29        <1.3    Mean          3 mm Hg  ------ ratio, ED                      gradient, Vol ED,     206 ml     ------  S MOD1                           VTI ratio  0.68        ------ Vol ES,     149 ml     ------  LVOT/AV MOD1  Area, VTI  2.81 cm^2   ------ EF, MOD1     28 %      ------  Area index  1.2 cm^2/m ------ Vol index,   88 ml/m^2 ------  (VTI)           ^2 ED, MOD1                       Peak vel    0.7        ------ Vol index,   63 ml/m^2 ------  ratio, ES, MOD1                        LVOT/AV Vol ES,     125 ml     ------  Area, Vmax 2.91 cm^2   ------ MOD2                           Area index 1.24 cm^2/m ------ Vol index,   53 ml/m^2 ------  (Vmax)          ^2 ES, MOD2                       Mitral valve Ventricular septum             Peak E vel  149 cm/s   ------ IVS, ED    14.4 mm     ------  Peak A vel  130 cm/s   ------ LVOT                           Mean vel,  72.6 cm/s   ------ Diam, S      23 mm     ------  D Area       4.15 cm^2   ------  Decelerati  243 ms     150-23 Aorta                          on time                0 Root diam,   34 mm     ------  Mean          3 mm Hg  ------ ED                             gradient, Left atrium                    D AP dim       38 mm     ------  Peak          9 mm Hg  ------ AP dim     1.62 cm/m^2 <2.2    gradient, index                          D                                Peak E/A    1.1        ------  ratio                                Area       2.19 cm^2   ------                                (LVOT)                                continuity                                Area index 0.93 cm^2/m ------                                (LVOT           ^2                                cont)                                Annulus    30.9 cm     ------                                VTI                                Max regurg  482 cm/s   ------                                vel                                Regurg VTI  167 cm     ------                                Tricuspid valve                                Regurg      248 cm/s   ------                                peak vel                                Peak RV-RA   25 mm Hg  ------                                gradient,  S                                Pulmonic valve                                Regurg      119 cm/s   ------                                vel, ED    ------------------------------------------------------------ Prepared and Electronically Authenticated by  Nona Dell, MD 2013-06-26T10:52:29.800      *RADIOLOGY REPORT*   Clinical Data: Mitral valve repair.   CHEST - 2 VIEW   Comparison: 12/28/2011.   Findings: Trachea is midline.  Heart size stable.  Left subclavian AICD lead tip projects over the right ventricle.  Sternotomy wires are stable in position.  Right basilar atelectasis or scarring with an elevated right hemidiaphragm, as before.  Lungs are otherwise clear.  No pleural fluid.  Degenerative changes are seen in the spine.   IMPRESSION: Bibasilar atelectasis or scarring with an elevated right hemidiaphragm, stable.     Original Report Authenticated By: Leanna Battles, M.D.    Impression:  The patient is doing exceptionally well status post coronary artery bypass grafting and mitral valve repair in February of 2013. Late followup echocardiogram looks good although the patient has known ischemic cardiomyopathy with ejection fraction 20-25%, unchanged from previously.  Patient underwent placement of a defibrillator by Dr. Ladona Ridgel and continues to do very well clinically with no shortness of breath, no chest pain, and no history of any arrhythmias.  Plan:  In the future the patient will call and return to see [place] as needed. All of his questions been addressed.   Salvatore Decent. Cornelius Moras, MD 07/16/2012 10:48 AM

## 2012-07-16 NOTE — Patient Instructions (Signed)
Continue to follow up with Dr Ladona Ridgel and your primary care physician on a regular basis and make every effort to keep your diabetes and cholesterol under tight control

## 2012-07-18 ENCOUNTER — Other Ambulatory Visit: Payer: Self-pay | Admitting: Physician Assistant

## 2012-07-29 IMAGING — CR DG CHEST 2V
2 series · 2 of 2 positions shown · non-contrast
Comparison: Chest x-ray of 10/22/2011

CLINICAL DATA: 3-week follow-up after heart surgery

CHEST - 2 VIEW

[w chest pa]
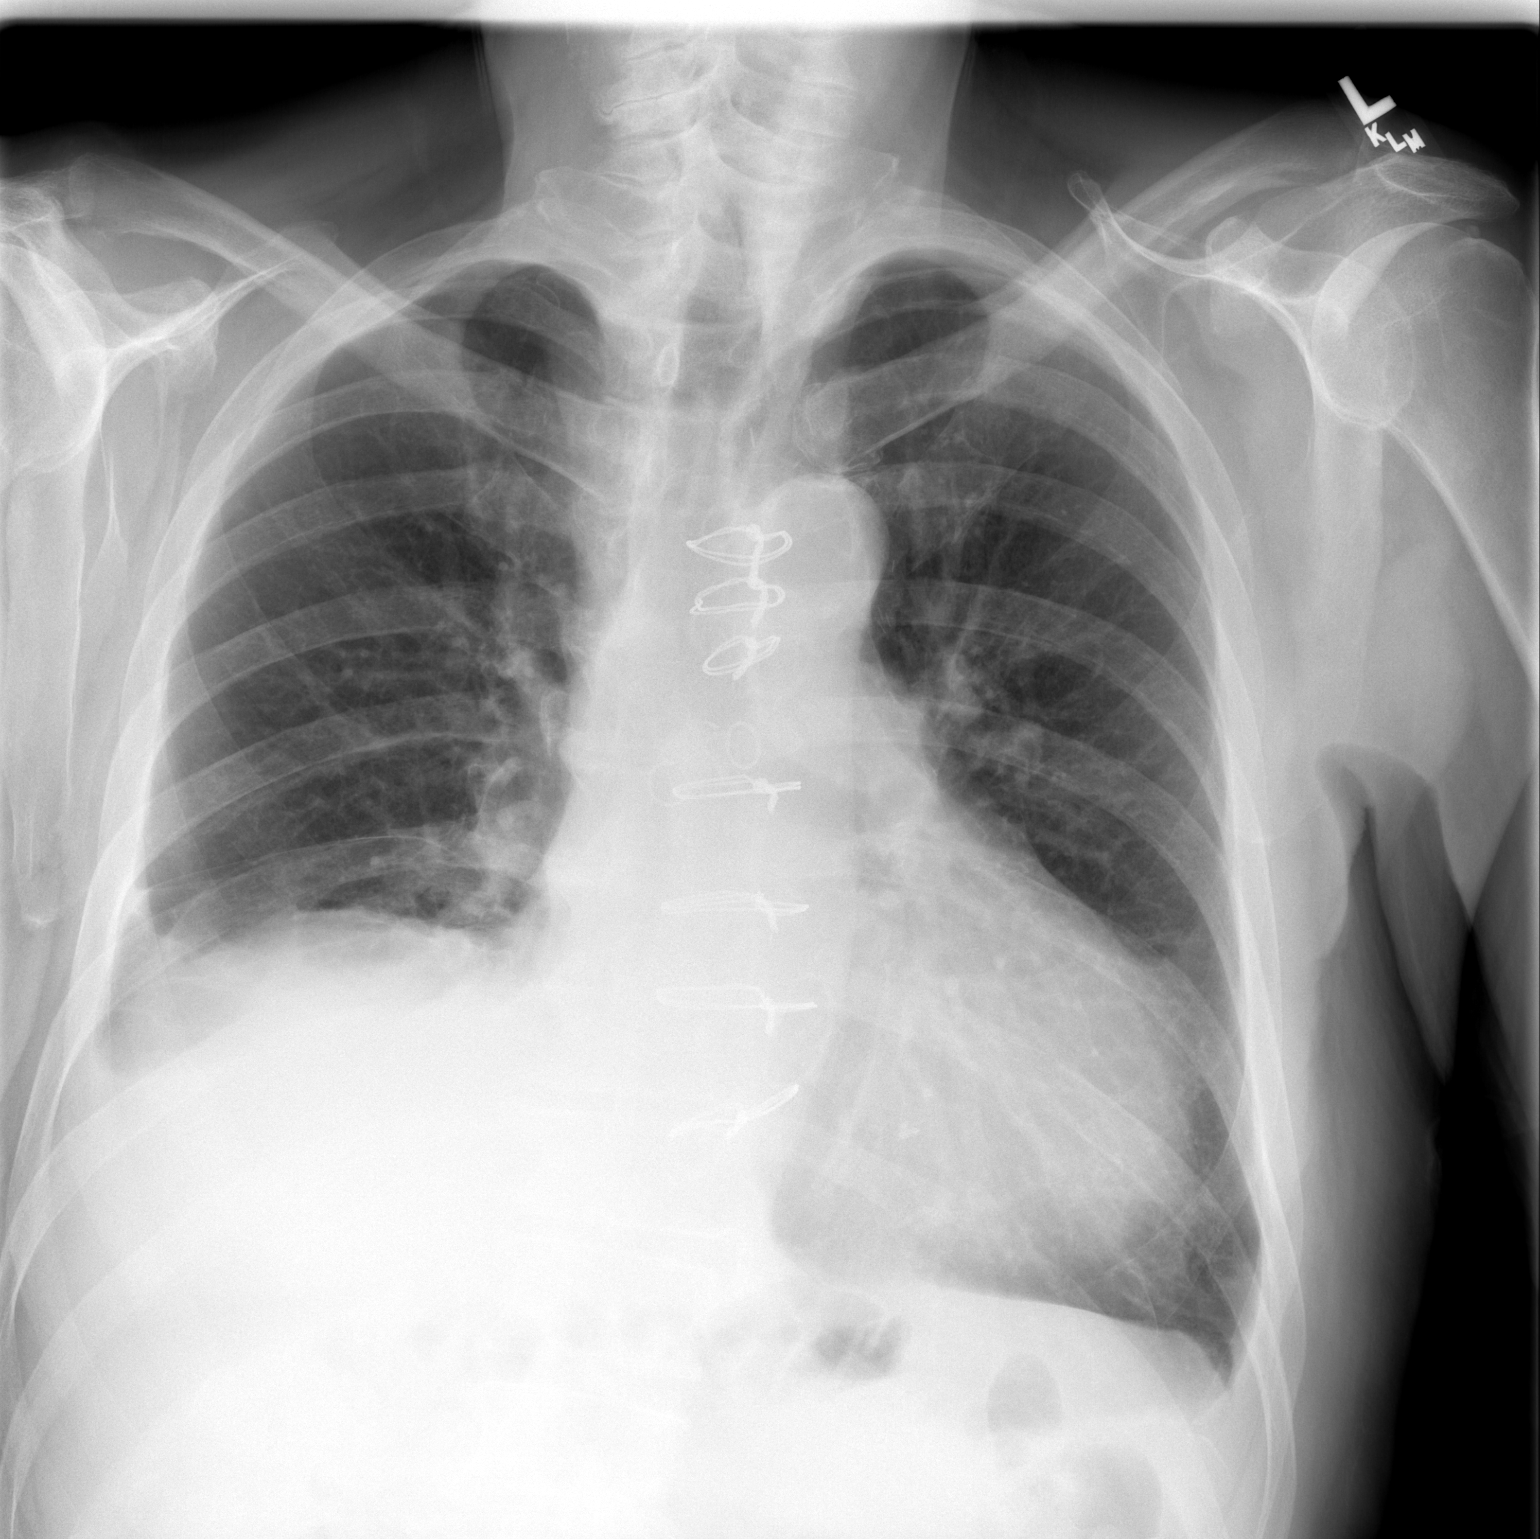

[w chest lat]
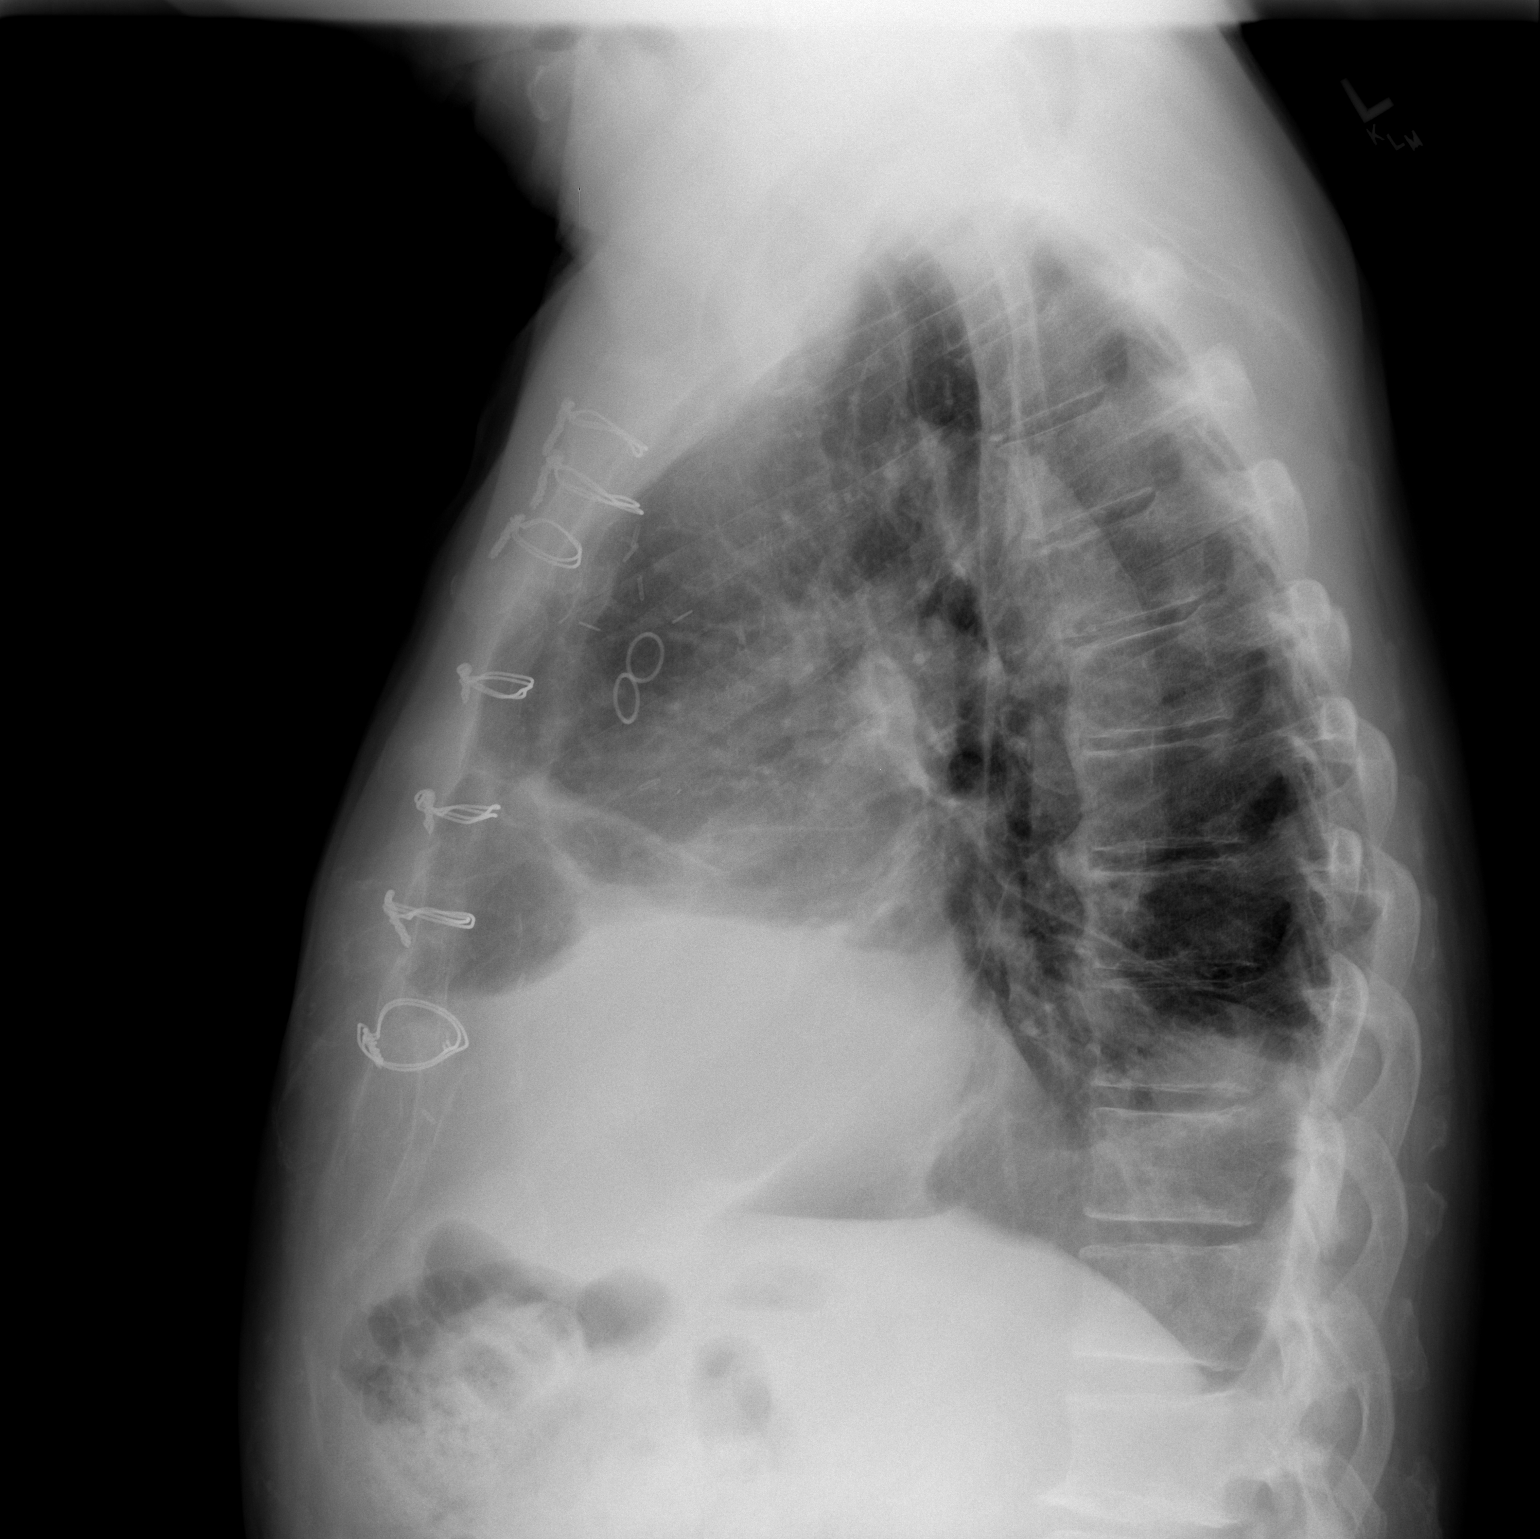

[2 of 2 positions shown; findings below may reference images not displayed]

FINDINGS: Aeration of the right lung base has improved slightly.
There is still atelectasis and a small right effusion with a small
left effusion remaining as well.  Cardiomegaly is stable.  Median
sternotomy sutures are noted from prior CABG.
IMPRESSION: Minimally improved aeration.  Persistent small effusions and right
basilar atelectasis.

## 2012-10-08 ENCOUNTER — Other Ambulatory Visit: Payer: Self-pay | Admitting: Internal Medicine

## 2012-10-08 ENCOUNTER — Encounter: Payer: Self-pay | Admitting: Internal Medicine

## 2012-10-08 ENCOUNTER — Ambulatory Visit (INDEPENDENT_AMBULATORY_CARE_PROVIDER_SITE_OTHER): Payer: Medicare Other | Admitting: *Deleted

## 2012-10-08 DIAGNOSIS — I255 Ischemic cardiomyopathy: Secondary | ICD-10-CM

## 2012-10-08 DIAGNOSIS — I2589 Other forms of chronic ischemic heart disease: Secondary | ICD-10-CM

## 2012-10-08 DIAGNOSIS — Z95810 Presence of automatic (implantable) cardiac defibrillator: Secondary | ICD-10-CM

## 2012-10-15 LAB — REMOTE ICD DEVICE
ATRIAL PACING ICD: 4 pct
BATTERY VOLTAGE: 3.12 V
DEV-0020ICD: NEGATIVE
HV IMPEDENCE: 74 Ohm
VENTRICULAR PACING ICD: 4 pct

## 2012-10-31 ENCOUNTER — Encounter: Payer: Self-pay | Admitting: *Deleted

## 2012-11-05 ENCOUNTER — Other Ambulatory Visit: Payer: Self-pay | Admitting: Physician Assistant

## 2013-01-03 ENCOUNTER — Ambulatory Visit (INDEPENDENT_AMBULATORY_CARE_PROVIDER_SITE_OTHER): Payer: Medicare Other | Admitting: Physician Assistant

## 2013-01-03 ENCOUNTER — Encounter: Payer: Self-pay | Admitting: Physician Assistant

## 2013-01-03 VITALS — BP 142/86 | HR 72 | Ht 77.0 in | Wt 228.8 lb

## 2013-01-03 DIAGNOSIS — I2589 Other forms of chronic ischemic heart disease: Secondary | ICD-10-CM

## 2013-01-03 DIAGNOSIS — I10 Essential (primary) hypertension: Secondary | ICD-10-CM

## 2013-01-03 DIAGNOSIS — I5022 Chronic systolic (congestive) heart failure: Secondary | ICD-10-CM

## 2013-01-03 DIAGNOSIS — E785 Hyperlipidemia, unspecified: Secondary | ICD-10-CM | POA: Insufficient documentation

## 2013-01-03 DIAGNOSIS — Z95810 Presence of automatic (implantable) cardiac defibrillator: Secondary | ICD-10-CM

## 2013-01-03 DIAGNOSIS — I255 Ischemic cardiomyopathy: Secondary | ICD-10-CM

## 2013-01-03 MED ORDER — CARVEDILOL 25 MG PO TABS: 25.0000 mg | ORAL_TABLET | Freq: Two times a day (BID) | ORAL | Status: DC

## 2013-01-03 MED ORDER — ASPIRIN EC 81 MG PO TBEC: 81.0000 mg | DELAYED_RELEASE_TABLET | Freq: Every day | ORAL | Status: DC

## 2013-01-03 NOTE — Patient Instructions (Addendum)
   Decrease Aspirin to 81mg  daily  Increase Coreg to 25mg  twice a day   Continue all other current medications. Your physician wants you to follow up in: 6 months.  You will receive a reminder letter in the mail one-two months in advance.  If you don't receive a letter, please call our office to schedule the follow up appointment

## 2013-01-03 NOTE — Assessment & Plan Note (Signed)
Stable on current medication regimen 

## 2013-01-03 NOTE — Assessment & Plan Note (Signed)
On statin, followed by his endocrinologist. Recommended aggressive management with target LDL 70 or less, if feasible.

## 2013-01-03 NOTE — Assessment & Plan Note (Signed)
Followed by Dr. Sharrell Ku in our [place] clinic.

## 2013-01-03 NOTE — Progress Notes (Signed)
Primary Cardiologist: Mady Gemma, MD   HPI: Since his last visit, he is status post successful implantation of a Biotronik defibrillator, July 2013, and was seen for scheduled device clinic follow up, by Dr. Sharrell Ku, in our [place] office, last November.  Patient denies any interim development of exertional CP, DOE, orthopnea, PND, peripheral edema, or ICD discharges. He monitors his weight daily, and is also followed by [employer] heart failure staff, including monthly RN visits. He remains extremely active, exercising 3-4x times a week and playing golf twice weekly.  Allergies  Allergen Reactions  . Penicillins Palpitations    Current Outpatient Prescriptions  Medication Sig Dispense Refill  . CALCIUM-MAGNESIUM-ZINC PO Take 2 tablets by mouth daily.      . carvedilol (COREG) 25 MG tablet Take 1 tablet (25 mg total) by mouth 2 (two) times daily.  60 tablet  6  . furosemide (LASIX) 40 MG tablet Take 40 mg by mouth daily. Takes extra 1/2 when needed      . losartan (COZAAR) 50 MG tablet TAKE 1 TABLET ONCE A DAY  30 tablet  6  . metFORMIN (GLUCOPHAGE) 850 MG tablet Take 850 mg by mouth 2 (two) times daily with a meal.      . Multiple Vitamins-Minerals (MULTIVITAMINS THER. W/MINERALS) TABS Take 1 tablet by mouth daily.      . niacin (NIASPAN) 1000 MG CR tablet Take 1,000 mg by mouth at bedtime.      . Omega-3 Fatty Acids (FISH OIL) 1000 MG CAPS Take 1 capsule by mouth 2 (two) times daily.      [name] Kitchen omeprazole (PRILOSEC) 20 MG capsule Take 20 mg by mouth daily.      . rosuvastatin (CRESTOR) 20 MG tablet Take 20 mg by mouth every evening.      [name] Kitchen spironolactone (ALDACTONE) 25 MG tablet Take 1 tablet (25 mg total) by mouth daily.  30 tablet  6  . testosterone cypionate (DEPOTESTOTERONE CYPIONATE) 200 MG/ML injection Inject 200 mg into the muscle every 14 (fourteen) days.      [name] Kitchen aspirin EC 81 MG tablet Take 1 tablet (81 mg total) by mouth daily.       No current  facility-administered medications for this visit.    Past Medical History  Diagnosis Date  . Hypertension   . Hyperlipidemia   . Heart murmur   . Shortness of breath     "lying down & w/exertion"  . Diabetes mellitus   . GERD (gastroesophageal reflux disease)   . Arthritis   . Chronic systolic heart failure   . Mitral regurgitation     S/P mitral valve repair [[id number]][  . Ischemic cardiomyopathy     S/P CABG x 3; EF 25%  . Carotid artery disease     60-79% LICA  . Allergy to ACE inhibitors     Angioedema  . Renal insufficiency   . Coronary artery disease   . Myocardial infarction   . Dysrhythmia     heart block  . ICD (implantable cardiac defibrillator) in place 03/28/2012  . CHF (congestive heart failure)     Past Surgical History  Procedure Laterality Date  . Knee arthroscopy  ~ 2008    right  . Nasal sinus surgery  1990's    right  . Mitral valve repair  10/17/2011    Procedure: MITRAL VALVE REPAIR (MVR);  Surgeon: Purcell Nails, MD;  Location: [facility] OR;  Service: Open Heart Surgery;  Laterality: N/A;  . Coronary artery  bypass graft  10/17/2011    Procedure: CORONARY ARTERY BYPASS GRAFTING (CABG);  Surgeon: Purcell Nails, MD;  Location: [facility] OR;  Service: Open Heart Surgery;  Laterality: N/A;  . Icd  03/28/2012    History   Social History  . Marital Status: Married    Spouse Name: N/A    Number of Children: N/A  . Years of Education: N/A   Occupational History  . Retired    Social History Main Topics  . Smoking status: Never Smoker   . Smokeless tobacco: Never Used  . Alcohol Use: No  . Drug Use: No  . Sexually Active: Yes   Other Topics Concern  . Not on file   Social History Narrative  . No narrative on file    Family History  Problem Relation Age of Onset  . Heart disease Mother     ROS: no nausea, vomiting; no fever, chills; no melena, hematochezia; no claudication  PHYSICAL EXAM: BP 142/86  Pulse 72  Ht 6\' 5"  (1.956 m)  Wt 228 lb 12.8  oz (103.783 kg)  BMI 27.13 kg/m2  SpO2 98% GENERAL: 79 year old male; NAD HEENT: NCAT, PERRLA, EOMI; sclera clear; no xanthelasma NECK: palpable bilateral carotid pulses, no bruits; no JVD; no TM LUNGS: CTA bilaterally CARDIAC: RRR (S1, S2); no significant murmurs; no rubs or gallops ABDOMEN: soft, non-tender; intact BS EXTREMETIES: no significant peripheral edema SKIN: warm/dry; no obvious rash/lesions MUSCULOSKELETAL: no joint deformity NEURO: no focal deficit; NL affect   EKG:    ASSESSMENT & PLAN:  Chronic systolic heart failure Euvolemic by history and exam. Continue current diuretic regimen, which consists of both furosemide and Aldactone. Patient is not on supplemental potassium, and has blood work followed regularly by his primary M.D. staff.  Ischemic cardiomyopathy Will increase carvedilol to target dose 25 mg twice a day, and decrease ASA to 81 mg daily. Reassess clinical status in 6 months.  ICD-Biotronik Followed by Dr. Sharrell Ku in our [place] clinic.  HTN (hypertension) Stable on current medication regimen.  HLD (hyperlipidemia) On statin, followed by his endocrinologist. Recommended aggressive management with target LDL 70 or less, if feasible.    Gene Juni Glaab, PAC

## 2013-01-03 NOTE — Assessment & Plan Note (Signed)
Euvolemic by history and exam. Continue current diuretic regimen, which consists of both furosemide and Aldactone. Patient is not on supplemental potassium, and has blood work followed regularly by his primary M.D. staff.

## 2013-01-03 NOTE — Assessment & Plan Note (Signed)
Will increase carvedilol to target dose 25 mg twice a day, and decrease ASA to 81 mg daily. Reassess clinical status in 6 months.

## 2013-01-14 ENCOUNTER — Encounter: Payer: Medicare Other | Admitting: *Deleted

## 2013-01-14 ENCOUNTER — Ambulatory Visit (INDEPENDENT_AMBULATORY_CARE_PROVIDER_SITE_OTHER): Payer: Medicare Other | Admitting: *Deleted

## 2013-01-14 ENCOUNTER — Encounter: Payer: Self-pay | Admitting: Internal Medicine

## 2013-01-14 DIAGNOSIS — I2589 Other forms of chronic ischemic heart disease: Secondary | ICD-10-CM

## 2013-01-14 DIAGNOSIS — Z95810 Presence of automatic (implantable) cardiac defibrillator: Secondary | ICD-10-CM

## 2013-01-14 DIAGNOSIS — I255 Ischemic cardiomyopathy: Secondary | ICD-10-CM

## 2013-01-15 ENCOUNTER — Encounter: Payer: Self-pay | Admitting: *Deleted

## 2013-01-17 ENCOUNTER — Telehealth: Payer: Self-pay | Admitting: *Deleted

## 2013-01-17 NOTE — Telephone Encounter (Signed)
PT STATES HE GOT A LETTER ABOUT MISSING HIS DEFIB CHECK. PT STATES THAT HE DOESN'T HAVE TO HOOK UP MACHINE THAT IT AUTOMATICALLY CHECKS WHEN SCHEDULED, STATES THAT HE NEVER GET A TIME, SO HE DOESN'T KNOW IF HE WAS AROUND AT THE CORRECT TIME.

## 2013-01-17 NOTE — Telephone Encounter (Signed)
Transmission received, patient aware. 

## 2013-01-18 LAB — REMOTE ICD DEVICE
BATTERY VOLTAGE: 3.13 V
HV IMPEDENCE: 77 Ohm
RV LEAD AMPLITUDE: 11.4 mv
VENTRICULAR PACING ICD: 3 pct

## 2013-01-25 ENCOUNTER — Encounter: Payer: Self-pay | Admitting: *Deleted

## 2013-02-13 ENCOUNTER — Other Ambulatory Visit: Payer: Self-pay | Admitting: Physician Assistant

## 2013-03-11 ENCOUNTER — Other Ambulatory Visit: Payer: Self-pay | Admitting: Physician Assistant

## 2013-04-01 ENCOUNTER — Telehealth: Payer: Self-pay | Admitting: Cardiology

## 2013-04-01 NOTE — Telephone Encounter (Signed)
Patient Coreg was increased in May 2014. Patient stated that since med change he has not felt good, weak, fatigue, and not up to do anything. Went to family doctor (Dr. Charm Barges)  on 03-26-2013 BP was 95/56 HR 71 after playing 18 holes of golf. Dr Charm Barges informed him to continue to monitor BP and to inform cardiologist of BP readings.Took today 04-01-2013 at drug store it was BP 100/56 HR 71. Pt states that he sometimes feels dizzy and lightheaded after certain activities like exercising or sitting down for a while. Pt stated that when he last seen Dr. Ladona Ridgel, he stated that he would take over his primary cardiac care. Due to PA Gene Serpe being out of office forwarding message to Dr. Ladona Ridgel.

## 2013-04-03 MED ORDER — LOSARTAN POTASSIUM 50 MG PO TABS: 25.0000 mg | ORAL_TABLET | Freq: Every day | ORAL | Status: DC

## 2013-04-03 NOTE — Telephone Encounter (Signed)
Spoke with Gene Serpe, PA he advised to decrease Losartan to 25 Mg daily and to continue to Coreg dose of 25 MG. Informed pt to make these changes and to continue to monitor blood pressure and if it continues to run low to give [place] a call. Pt verbalized understanding.

## 2013-04-05 ENCOUNTER — Telehealth: Payer: Self-pay | Admitting: Physician Assistant

## 2013-04-05 NOTE — Telephone Encounter (Signed)
Sig - Route: Take 0.5 tablets (25 mg total) by mouth daily. - Oral E-Prescribing Status: Receipt confirmed by pharmacy (04/03/2013 3:30 PM EDT)  losartan (COZAAR) 50 MG tablet   PATIENT STATED PHARMACY STILL HAVE NOT RECEIVED MEDICINE  [facility] pharmacy

## 2013-04-05 NOTE — Telephone Encounter (Signed)
Left message on patient voice mail - just spoke with [facility].  Cozaar is ready to be picked up.

## 2013-04-08 ENCOUNTER — Telehealth: Payer: Self-pay | Admitting: Cardiology

## 2013-04-08 NOTE — Telephone Encounter (Signed)
Pt called an left VM stating that his losartan was not sent to [facility] care pharmacy. Looked in pt's chart and seen that receipt of medicine was confirmed by pharmacy. Called pharmacy verified if they received prescription and if pt had picked up. Pharmacy confirmed both.

## 2013-04-09 ENCOUNTER — Telehealth: Payer: Self-pay | Admitting: *Deleted

## 2013-04-09 NOTE — Telephone Encounter (Signed)
DR [name] CALLED AND SPOKE WITH DR Eden Emms RE  PT   HAD EKG PT WAS BRADYCARDIC PER  DR NISHAN NEEDS TO HAVE DEVICE CHECKED TOM  HOLD  CARVEDILOL  X 2 DAYS  AND  THEN  DECREASE TO  1/2 TAB  BID . WILL FORWARD TO DEVICE  CLINIC TO ARRANGE AN APPT /CY

## 2013-04-11 ENCOUNTER — Encounter: Payer: Self-pay | Admitting: Internal Medicine

## 2013-04-11 ENCOUNTER — Ambulatory Visit (INDEPENDENT_AMBULATORY_CARE_PROVIDER_SITE_OTHER): Payer: Medicare Other | Admitting: *Deleted

## 2013-04-11 DIAGNOSIS — I5022 Chronic systolic (congestive) heart failure: Secondary | ICD-10-CM

## 2013-04-11 LAB — ICD DEVICE OBSERVATION
ATRIAL PACING ICD: 0 pct
RV LEAD AMPLITUDE: 14.7 mv
VENTRICULAR PACING ICD: 5 pct

## 2013-04-11 NOTE — Progress Notes (Signed)
ICD reprogrammed base rate 50->55bpm.  Follow up as scheduled.

## 2013-04-19 ENCOUNTER — Encounter: Payer: Self-pay | Admitting: Adult Health

## 2013-04-19 ENCOUNTER — Telehealth: Payer: Self-pay

## 2013-04-19 ENCOUNTER — Ambulatory Visit: Payer: Medicare Other | Admitting: Adult Health

## 2013-04-19 ENCOUNTER — Ambulatory Visit (INDEPENDENT_AMBULATORY_CARE_PROVIDER_SITE_OTHER): Payer: Medicare Other | Admitting: Adult Health

## 2013-04-19 VITALS — BP 134/89 | HR 71 | Ht 77.0 in | Wt 235.0 lb

## 2013-04-19 DIAGNOSIS — I255 Ischemic cardiomyopathy: Secondary | ICD-10-CM

## 2013-04-19 DIAGNOSIS — I5022 Chronic systolic (congestive) heart failure: Secondary | ICD-10-CM

## 2013-04-19 DIAGNOSIS — Z951 Presence of aortocoronary bypass graft: Secondary | ICD-10-CM

## 2013-04-19 DIAGNOSIS — Z95810 Presence of automatic (implantable) cardiac defibrillator: Secondary | ICD-10-CM

## 2013-04-19 DIAGNOSIS — I2589 Other forms of chronic ischemic heart disease: Secondary | ICD-10-CM

## 2013-04-19 DIAGNOSIS — I10 Essential (primary) hypertension: Secondary | ICD-10-CM

## 2013-04-19 MED ORDER — CARVEDILOL 25 MG PO TABS: 12.5000 mg | ORAL_TABLET | Freq: Two times a day (BID) | ORAL | Status: DC

## 2013-04-19 NOTE — Progress Notes (Signed)
HPI: Wayne Gordon is a 79 y/o patient of Dr.Arida last seen by Gene Serpe in the [place] office on 01/03/2013.  He has a history of ICM with [employer] defibrillator place in July of 2013 by Dr.Taylor. He comes today without complaints.  There were some issues in the past concerning doses of carvedilol which were increased causing profound sluggishness and bradycardia, with hypotension. Losartan was decreased to 50 mg daily. He had pacemaker interrogated in August of 2014 and had this reprogrammed base rate 50->55. Coreg was reduced to 12.5 mg BID. He had labs drawn by endocrinologist, Dr. Marrion Coy, 2 weeks ago, will request labs.   Since pacemaker and coreg have been adjusted, he has been feeling much better and is without complaint. He plans to go to [place] to play golf and wanted to be checked before he left.   Allergies  Allergen Reactions  . Penicillins Palpitations    Current Outpatient Prescriptions  Medication Sig Dispense Refill  . aspirin EC 81 MG tablet Take 1 tablet (81 mg total) by mouth daily.      [name] Kitchen CALCIUM-MAGNESIUM-ZINC PO Take 2 tablets by mouth daily.      . carvedilol (COREG) 25 MG tablet Take 12.5 mg by mouth 2 (two) times daily. On hold until 04/12/13      . furosemide (LASIX) 40 MG tablet Take 40 mg by mouth daily. Takes extra 1/2 when needed      . losartan (COZAAR) 50 MG tablet Take 0.5 tablets (25 mg total) by mouth daily.  30 tablet  6  . metFORMIN (GLUCOPHAGE) 850 MG tablet Take 850 mg by mouth 2 (two) times daily with a meal.      . Multiple Vitamins-Minerals (MULTIVITAMINS THER. W/MINERALS) TABS Take 1 tablet by mouth daily.      . niacin (NIASPAN) 1000 MG CR tablet Take 1,000 mg by mouth at bedtime.      . Omega-3 Fatty Acids (FISH OIL) 1000 MG CAPS Take 1 capsule by mouth 2 (two) times daily.      [name] Kitchen omeprazole (PRILOSEC) 20 MG capsule Take 20 mg by mouth daily.      . rosuvastatin (CRESTOR) 20 MG tablet Take 20 mg by mouth every evening.      [name] Kitchen spironolactone  (ALDACTONE) 25 MG tablet TAKE 1 TABLET DAILY  30 tablet  6  . testosterone cypionate (DEPOTESTOTERONE CYPIONATE) 200 MG/ML injection Inject 200 mg into the muscle every 14 (fourteen) days.       No current facility-administered medications for this visit.    Past Medical History  Diagnosis Date  . Hypertension   . Hyperlipidemia   . Heart murmur   . Shortness of breath     "lying down & w/exertion"  . Diabetes mellitus   . GERD (gastroesophageal reflux disease)   . Arthritis   . Chronic systolic heart failure   . Mitral regurgitation     S/P mitral valve repair [[id number]][  . Ischemic cardiomyopathy     S/P CABG x 3; EF 25%  . Carotid artery disease     60-79% LICA  . Allergy to ACE inhibitors     Angioedema  . Renal insufficiency   . Coronary artery disease   . Myocardial infarction   . Dysrhythmia     heart block  . ICD (implantable cardiac defibrillator) in place 03/28/2012  . CHF (congestive heart failure)     Past Surgical History  Procedure Laterality Date  . Knee arthroscopy  ~ 2008  right  . Nasal sinus surgery  1990's    right  . Mitral valve repair  10/17/2011    Procedure: MITRAL VALVE REPAIR (MVR);  Surgeon: Purcell Nails, MD;  Location: [facility] OR;  Service: Open Heart Surgery;  Laterality: N/A;  . Coronary artery bypass graft  10/17/2011    Procedure: CORONARY ARTERY BYPASS GRAFTING (CABG);  Surgeon: Purcell Nails, MD;  Location: [facility] OR;  Service: Open Heart Surgery;  Laterality: N/A;  . Icd  03/28/2012    ZOX:WRUEAV of systems complete and found to be negative unless listed above  PHYSICAL EXAM BP 134/89  Pulse 71  Ht 6\' 5"  (1.956 m)  Wt 235 lb (106.595 kg)  BMI 27.86 kg/m2 General: Well developed, well nourished, in no acute distress Head: Eyes PERRLA, No xanthomas.   Normal cephalic and atramatic  Lungs: Clear bilaterally to auscultation and percussion. Heart: HRRR S1 S2, without MRG.  Pulses are 2+ & equal.            No carotid bruit. No JVD.     Abdomen: Bowel sounds are positive, abdomen soft and non-tender without masses or                  Hernia's noted. Msk:  Back normal, normal gait. Normal strength and tone for age. Extremities: No clubbing, cyanosis or edema.  DP +1 Neuro: Alert and oriented X 3. Psych:  Good affect, responds appropriately    ASSESSMENT AND PLAN

## 2013-04-19 NOTE — Progress Notes (Signed)
Name: Wayne Gordon    DOB: [birth year]  Age: 79 y.o.  MR#: [id number]       PCP:  Samuel Jester, DO      Insurance: Payor: [employer] MEDICARE / Plan: AARP MEDICARE COMPLETE / Product Type: *No Product type* /   CC:   No chief complaint on file.   VS Filed Vitals:   04/19/13 1332  BP: 134/89  Pulse: 71  Height: 6\' 5"  (1.956 m)  Weight: 235 lb (106.595 kg)    Weights Current Weight  04/19/13 235 lb (106.595 kg)  01/03/13 228 lb 12.8 oz (103.783 kg)  07/16/12 228 lb (103.42 kg)    Blood Pressure  BP Readings from Last 3 Encounters:  04/19/13 134/89  01/03/13 142/86  07/16/12 113/70     Admit date:  (Not on file) Last encounter with RMR:  Visit date not found   Allergy Penicillins  Current Outpatient Prescriptions  Medication Sig Dispense Refill  . aspirin EC 81 MG tablet Take 1 tablet (81 mg total) by mouth daily.      [name] Kitchen CALCIUM-MAGNESIUM-ZINC PO Take 2 tablets by mouth daily.      . carvedilol (COREG) 25 MG tablet Take 12.5 mg by mouth 2 (two) times daily. On hold until 04/12/13      . furosemide (LASIX) 40 MG tablet Take 40 mg by mouth daily. Takes extra 1/2 when needed      . losartan (COZAAR) 50 MG tablet Take 0.5 tablets (25 mg total) by mouth daily.  30 tablet  6  . metFORMIN (GLUCOPHAGE) 850 MG tablet Take 850 mg by mouth 2 (two) times daily with a meal.      . Multiple Vitamins-Minerals (MULTIVITAMINS THER. W/MINERALS) TABS Take 1 tablet by mouth daily.      . niacin (NIASPAN) 1000 MG CR tablet Take 1,000 mg by mouth at bedtime.      . Omega-3 Fatty Acids (FISH OIL) 1000 MG CAPS Take 1 capsule by mouth 2 (two) times daily.      [name] Kitchen omeprazole (PRILOSEC) 20 MG capsule Take 20 mg by mouth daily.      . rosuvastatin (CRESTOR) 20 MG tablet Take 20 mg by mouth every evening.      [name] Kitchen spironolactone (ALDACTONE) 25 MG tablet TAKE 1 TABLET DAILY  30 tablet  6  . testosterone cypionate (DEPOTESTOTERONE CYPIONATE) 200 MG/ML injection Inject 200 mg into the muscle every  14 (fourteen) days.       No current facility-administered medications for this visit.    Discontinued Meds:   There are no discontinued medications.  Patient Active Problem List   Diagnosis Date Noted  . HLD (hyperlipidemia) 01/03/2013  . ICD-[employer] 03/30/2012  . S/P MVR (mitral valve repair) 01/16/2012  . HTN (hypertension) 11/28/2011  . Chronic systolic heart failure 10/31/2011  . Ischemic cardiomyopathy 10/31/2011  . Renal insufficiency 10/31/2011  . Current use of long term anticoagulation 10/31/2011  . Encounter for long-term (current) use of anticoagulants 10/28/2011  . S/P mitral valve repair 10/17/2011  . S/P CABG x 3 10/17/2011  . Acute systolic heart failure 10/12/2011  . Mitral regurgitation 10/12/2011  . NSTEMI (non-ST elevated myocardial infarction) 10/11/2011    LABS    Component Value Date/Time   NA 135 03/22/2012 1310   NA 140 10/24/2011 0625   NA 141 10/23/2011 0505   K 4.2 03/22/2012 1310   K 3.9 10/24/2011 0625   K 4.1 10/23/2011 0505   CL 97 03/22/2012 1310  CL 107 10/24/2011 0625   CL 108 10/23/2011 0505   CO2 30 03/22/2012 1310   CO2 24 10/24/2011 0625   CO2 22 10/23/2011 0505   GLUCOSE 130* 03/22/2012 1310   GLUCOSE 118* 10/24/2011 0625   GLUCOSE 115* 10/23/2011 0505   BUN 28* 03/22/2012 1310   BUN 20 10/24/2011 0625   BUN 30* 10/23/2011 0505   CREATININE 1.5 03/22/2012 1310   CREATININE 1.75* 10/24/2011 0625   CREATININE 1.91* 10/23/2011 0505   CALCIUM 9.1 03/22/2012 1310   CALCIUM 8.7 10/24/2011 0625   CALCIUM 8.4 10/23/2011 0505   GFRNONAA 36* 10/24/2011 0625   GFRNONAA 33* 10/23/2011 0505   GFRNONAA 29* 10/22/2011 0600   GFRAA 42* 10/24/2011 0625   GFRAA 38* 10/23/2011 0505   GFRAA 34* 10/22/2011 0600   CMP     Component Value Date/Time   NA 135 03/22/2012 1310   K 4.2 03/22/2012 1310   CL 97 03/22/2012 1310   CO2 30 03/22/2012 1310   GLUCOSE 130* 03/22/2012 1310   BUN 28* 03/22/2012 1310   CREATININE 1.5 03/22/2012 1310   CALCIUM 9.1 03/22/2012 1310    PROT 6.6 10/13/2011 0650   ALBUMIN 3.1* 10/22/2011 0600   AST 17 10/13/2011 0650   ALT 21 10/13/2011 0650   ALKPHOS 41 10/13/2011 0650   BILITOT 0.7 10/13/2011 0650   GFRNONAA 36* 10/24/2011 0625   GFRAA 42* 10/24/2011 0625       Component Value Date/Time   WBC 8.8 03/22/2012 1310   WBC 10.2 10/22/2011 0600   WBC 14.2* 10/21/2011 0647   HGB 12.1* 03/22/2012 1310   HGB 10.5* 10/22/2011 0600   HGB 10.8* 10/21/2011 0647   HCT 38.6* 03/22/2012 1310   HCT 30.4* 10/22/2011 0600   HCT 31.2* 10/21/2011 0647   MCV 78.2 03/22/2012 1310   MCV 87.9 10/22/2011 0600   MCV 88.1 10/21/2011 0647    Lipid Panel  No results found for this basename: chol, trig, hdl, cholhdl, vldl, ldlcalc    ABG    Component Value Date/Time   PHART 7.307* 10/17/2011 2219   PCO2ART 39.4 10/17/2011 2219   PO2ART 91.0 10/17/2011 2219   HCO3 19.8* 10/17/2011 2219   TCO2 21 10/18/2011 1638   ACIDBASEDEF 6.0* 10/17/2011 2219   O2SAT 96.0 10/17/2011 2219     No results found for this basename: TSH   BNP (last 3 results) No results found for this basename: PROBNP,  in the last 8760 hours Cardiac Panel (last 3 results) No results found for this basename: CKTOTAL, CKMB, TROPONINI, RELINDX,  in the last 72 hours  Iron/TIBC/Ferritin No results found for this basename: iron, tibc, ferritin     EKG Orders placed in visit on 04/19/13  . EKG 12-LEAD     Prior Assessment and Plan Problem List as of 04/19/2013     Cardiovascular and Mediastinum   NSTEMI (non-ST elevated myocardial infarction)   Acute systolic heart failure   Last Assessment & Plan   07/02/2012 Office Visit Written 07/02/2012  9:48 AM by Marinus Maw, MD     His chronic systolic heart failure is well compensated. We discussed regular daily weights, and the possibility of reducing his diuretic dosing. His dry weight is approximately 215 pounds, and at 222 pounds, he begins to become edematous. Adjustments of his daily diuretics were discussed. Also, the importance of a  low sodium diet was addressed.    Mitral regurgitation   Chronic systolic heart failure   Last Assessment &  Plan   01/03/2013 Office Visit Written 01/03/2013  1:54 PM by Prescott Parma, PA-C     Euvolemic by history and exam. Continue current diuretic regimen, which consists of both furosemide and Aldactone. Patient is not on supplemental potassium, and has blood work followed regularly by his primary M.D. staff.    Ischemic cardiomyopathy   Last Assessment & Plan   01/03/2013 Office Visit Written 01/03/2013  1:55 PM by Prescott Parma, PA-C     Will increase carvedilol to target dose 25 mg twice a day, and decrease ASA to 81 mg daily. Reassess clinical status in 6 months.    HTN (hypertension)   Last Assessment & Plan   01/03/2013 Office Visit Written 01/03/2013  1:57 PM by Prescott Parma, PA-C     Stable on current medication regimen.      Genitourinary   Renal insufficiency   Last Assessment & Plan   11/28/2011 Office Visit Written 11/28/2011 11:39 AM by Prescott Parma, PA     Recent followup labs stable with creatinine 1.4. We'll reassess, following addition of Aldactone.      Other   S/P mitral valve repair   S/P CABG x 3   Encounter for long-term (current) use of anticoagulants   Last Assessment & Plan   03/08/2012 Office Visit Written 03/08/2012  1:39 PM by Prescott Parma, PA     Coumadin will be discontinued, given that patient remains in NSR, verified by EKG. As noted, will increase ASA to 325 daily in the interim for one full year, then back to 81 mg daily, indefinitely.    Current use of long term anticoagulation   Last Assessment & Plan   10/31/2011 Office Visit Written 10/31/2011  3:41 PM by Prescott Parma, PA     Followup in our Coumadin clinic, as scheduled. Recommendation as to duration of Coumadin anticoagulation, to be deferred to Dr. Tressie Stalker.    S/P MVR (mitral valve repair)   ICD-[employer]ronik   Last Assessment & Plan   01/03/2013 Office Visit Written 01/03/2013  1:56 PM by Prescott Parma,  PA-C     Followed by Dr. Sharrell Ku in our [place] clinic.    HLD (hyperlipidemia)   Last Assessment & Plan   01/03/2013 Office Visit Written 01/03/2013  1:58 PM by Prescott Parma, PA-C     On statin, followed by his endocrinologist. Recommended aggressive management with target LDL 70 or less, if feasible.        Imaging: No results found.

## 2013-04-19 NOTE — Assessment & Plan Note (Signed)
Will continue to have pacemaker interrogations at pre-scheduled intervals with Dr. Ladona Ridgel. He is to call for any problems.

## 2013-04-19 NOTE — Assessment & Plan Note (Signed)
Blood pressure is well controlled at this time. No changes. I have reassured him that BP will be up and down throughout the day. He will be continued on current regimen.

## 2013-04-19 NOTE — Telephone Encounter (Signed)
carvedilol (COREG) 25 MG tablet  Per patient takes 12.5 twice daily.

## 2013-04-19 NOTE — Telephone Encounter (Signed)
rx sent to pharmacy by e-script  

## 2013-04-19 NOTE — Assessment & Plan Note (Signed)
No evidence of fluid overload, or DOE. He will continue on current medication regimen without changes. Will review labs when available.

## 2013-04-19 NOTE — Patient Instructions (Signed)
Your physician recommends that you schedule a follow-up appointment in: On Jul 04, 2013 at 1:15pm with Dr Ladona Ridgel  Your physician recommends that you continue on your current medications as directed. Please refer to the Current Medication list given to you today.

## 2013-04-19 NOTE — Assessment & Plan Note (Signed)
No cardiac complaints at this time of chest pain, DOE, or dizziness. He remains active and will go and play golf this weekend. Continue risk management.

## 2013-05-14 ENCOUNTER — Telehealth: Payer: Self-pay | Admitting: *Deleted

## 2013-05-14 NOTE — Telephone Encounter (Signed)
Spoke to pt dentist rep at Dr Henriette Combs office to advise ABT therapy needed/instructions. Pt dental rep understood.and will implement the instructions at pt upcoming extraction

## 2013-05-14 NOTE — Telephone Encounter (Signed)
Pt dentist called to see if pt needs any medication given prior to dental procedure per noted pt had surgery in 06-2012 for CARDIOTHORACIC SURGERY  Also noted pt had Cath 02-2012, call back number [phone], please advise per pt to have apt today

## 2013-05-14 NOTE — Telephone Encounter (Signed)
Patient of Dr. Kirke Corin and Dr. Ladona Ridgel. Reviewed records. Patient has a history of CABG with mitral valve repair using prosthetic ring, also ICD in place. Would suggest antibiotic prophylaxis prior to dental procedure. He has a penicillin allergy. Based on standard SBE prophylaxis protocol, alternatives would include either single preprocedure dose (30-60 minutes before) of either cephalexin 2 g, or clindamycin 600 mg, or azithromycin 500 mg.

## 2013-07-03 ENCOUNTER — Other Ambulatory Visit: Payer: Self-pay | Admitting: Internal Medicine

## 2013-07-04 ENCOUNTER — Ambulatory Visit (INDEPENDENT_AMBULATORY_CARE_PROVIDER_SITE_OTHER): Payer: Medicare Other | Admitting: Internal Medicine

## 2013-07-04 ENCOUNTER — Encounter: Payer: Self-pay | Admitting: Internal Medicine

## 2013-07-04 VITALS — BP 140/78 | HR 76 | Ht 77.0 in | Wt 236.0 lb

## 2013-07-04 DIAGNOSIS — I4949 Other premature depolarization: Secondary | ICD-10-CM

## 2013-07-04 DIAGNOSIS — I493 Ventricular premature depolarization: Secondary | ICD-10-CM | POA: Insufficient documentation

## 2013-07-04 DIAGNOSIS — Z95810 Presence of automatic (implantable) cardiac defibrillator: Secondary | ICD-10-CM

## 2013-07-04 DIAGNOSIS — I5022 Chronic systolic (congestive) heart failure: Secondary | ICD-10-CM

## 2013-07-04 NOTE — Assessment & Plan Note (Signed)
His device is working normally. We'll plan to recheck in several months. 

## 2013-07-04 NOTE — Assessment & Plan Note (Signed)
The patient has bigeminy on exam today. He is not aware of it. He has had no syncope. I am concerned about the density of PVCs as we know that are than 20,000 PVCs in a 24-hour period are a poor prognostic sign. We will have him wear a 24-hour Holter so that we can quantitate his PVC burden.

## 2013-07-04 NOTE — Progress Notes (Signed)
HPI Wayne Gordon returns today for followup. He is a very pleasant 79 year old man with an ischemic cardiomyopathy, chronic well compensated systolic heart failure, mild renal insufficiency, stage II, diabetes, and hypertension. He has done well in the interim, status post ICD implantation. No ICD shocks. No syncope, and no peripheral edema. He does note some fatigue and weakness. He has not had palpitations. Allergies  Allergen Reactions  . Penicillins Palpitations     Current Outpatient Prescriptions  Medication Sig Dispense Refill  . aspirin EC 81 MG tablet Take 1 tablet (81 mg total) by mouth daily.      [name] Kitchen CALCIUM-MAGNESIUM-ZINC PO Take 2 tablets by mouth daily.      . carvedilol (COREG) 25 MG tablet Take 0.5 tablets (12.5 mg total) by mouth 2 (two) times daily. On hold until 04/12/13  60 tablet  6  . furosemide (LASIX) 40 MG tablet Take 40 mg by mouth daily. Takes extra 1/2 when needed      . losartan (COZAAR) 50 MG tablet Take 0.5 tablets (25 mg total) by mouth daily.  30 tablet  6  . metFORMIN (GLUCOPHAGE) 850 MG tablet Take 850 mg by mouth 2 (two) times daily with a meal.      . Multiple Vitamins-Minerals (MULTIVITAMINS THER. W/MINERALS) TABS Take 1 tablet by mouth daily.      . niacin (NIASPAN) 1000 MG CR tablet Take 1,000 mg by mouth at bedtime.      . Omega-3 Fatty Acids (FISH OIL) 1000 MG CAPS Take 1 capsule by mouth 2 (two) times daily.      [name] Kitchen omeprazole (PRILOSEC) 20 MG capsule Take 20 mg by mouth daily.      . rosuvastatin (CRESTOR) 20 MG tablet Take 20 mg by mouth every evening.      [name] Kitchen spironolactone (ALDACTONE) 25 MG tablet TAKE 1 TABLET DAILY  30 tablet  6  . testosterone cypionate (DEPOTESTOTERONE CYPIONATE) 200 MG/ML injection Inject 200 mg into the muscle every 14 (fourteen) days.       No current facility-administered medications for this visit.     Past Medical History  Diagnosis Date  . Hypertension   . Hyperlipidemia   . Heart murmur   . Shortness of breath      "lying down & w/exertion"  . Diabetes mellitus   . GERD (gastroesophageal reflux disease)   . Arthritis   . Chronic systolic heart failure   . Mitral regurgitation     S/P mitral valve repair [[id number]][  . Ischemic cardiomyopathy     S/P CABG x 3; EF 25%  . Carotid artery disease     60-79% LICA  . Allergy to ACE inhibitors     Angioedema  . Renal insufficiency   . Coronary artery disease   . Myocardial infarction   . Dysrhythmia     heart block  . ICD (implantable cardiac defibrillator) in place 03/28/2012  . CHF (congestive heart failure)     ROS:   All systems reviewed and negative except as noted in the HPI.   Past Surgical History  Procedure Laterality Date  . Knee arthroscopy  ~ 2008    right  . Nasal sinus surgery  1990's    right  . Mitral valve repair  10/17/2011    Procedure: MITRAL VALVE REPAIR (MVR);  Surgeon: Purcell Nails, MD;  Location: [facility] OR;  Service: Open Heart Surgery;  Laterality: N/A;  . Coronary artery bypass graft  10/17/2011    Procedure: CORONARY ARTERY  BYPASS GRAFTING (CABG);  Surgeon: Purcell Nails, MD;  Location: [facility] OR;  Service: Open Heart Surgery;  Laterality: N/A;  . Icd  03/28/2012     Family History  Problem Relation Age of Onset  . Heart disease Mother      History   Social History  . Marital Status: Married    Spouse Name: N/A    Number of Children: N/A  . Years of Education: N/A   Occupational History  . Retired    Social History Main Topics  . Smoking status: Never Smoker   . Smokeless tobacco: Never Used  . Alcohol Use: No  . Drug Use: No  . Sexual Activity: Yes   Other Topics Concern  . Not on file   Social History Narrative  . No narrative on file     BP 140/78  Pulse 76  Ht 6\' 5"  (1.956 m)  Wt 236 lb 0.6 oz (107.067 kg)  BMI 27.98 kg/m2  Physical Exam:  Well appearing 79 year old man, NAD HEENT: Unremarkable Neck:  No JVD, no thyromegally Lungs:  Clear with no wheezes, rales, or  rhonchi. HEART:  IRegular rate rhythm, no murmurs, no rubs, no clicks Abd:  soft, positive bowel sounds, no organomegally, no rebound, no guarding Ext:  2 plus pulses, no edema, no cyanosis, no clubbing Skin:  No rashes no nodules Neuro:  CN II through XII intact, motor grossly intact  DEVICE  Normal device function.  See PaceArt for details.   Assess/Plan:

## 2013-07-04 NOTE — Patient Instructions (Signed)
Your physician recommends that you schedule a follow-up appointment in: 1 year with Dr Ladona Ridgel Bonita Quin will receive a reminder letter two months in advance reminding you to call and schedule your appointment. If you don't receive this letter, please contact our office.  Your physician has recommended that you wear a holter monitor. Holter monitors are medical devices that record the heart's electrical activity. Doctors most often use these monitors to diagnose arrhythmias. Arrhythmias are problems with the speed or rhythm of the heartbeat. The monitor is a small, portable device. You can wear one while you do your normal daily activities. This is usually used to diagnose what is causing palpitations/syncope (passing out).

## 2013-07-04 NOTE — Assessment & Plan Note (Signed)
His heart failure is class II. He will continue his current medical therapy.

## 2013-07-08 ENCOUNTER — Ambulatory Visit (HOSPITAL_COMMUNITY)
Admission: RE | Admit: 2013-07-08 | Discharge: 2013-07-08 | Disposition: A | Discharge status: Home / Self Care | Payer: Medicare Other | Source: Ambulatory Visit | Attending: Internal Medicine | Admitting: Internal Medicine

## 2013-07-08 DIAGNOSIS — I472 Ventricular tachycardia: Secondary | ICD-10-CM

## 2013-07-08 DIAGNOSIS — I4949 Other premature depolarization: Secondary | ICD-10-CM | POA: Insufficient documentation

## 2013-07-08 DIAGNOSIS — I493 Ventricular premature depolarization: Secondary | ICD-10-CM

## 2013-07-08 NOTE — Progress Notes (Signed)
24 hour Holter Monitor in progress.  Ends 07-09-13 @ 2:50 .

## 2013-07-09 LAB — MDC_IDC_ENUM_SESS_TYPE_INCLINIC
Brady Statistic RV Percent Paced: 9 %
Lead Channel Impedance Value: 577 Ohm
Lead Channel Pacing Threshold Pulse Width: 0.4 ms
Lead Channel Sensing Intrinsic Amplitude: 17.6 mV
Lead Channel Sensing Intrinsic Amplitude: 3.2 mV

## 2013-07-11 ENCOUNTER — Other Ambulatory Visit: Payer: Self-pay | Admitting: *Deleted

## 2013-07-11 DIAGNOSIS — I493 Ventricular premature depolarization: Secondary | ICD-10-CM

## 2013-07-18 ENCOUNTER — Telehealth: Payer: Self-pay | Admitting: Internal Medicine

## 2013-07-18 NOTE — Telephone Encounter (Signed)
Results of monitor / tgs

## 2013-07-22 NOTE — Telephone Encounter (Signed)
Please advise holter results

## 2013-07-24 NOTE — Telephone Encounter (Signed)
.  left message to have patient return my call to advise the results are pending

## 2013-07-30 NOTE — Telephone Encounter (Signed)
Voicemail left for Dr. Lewayne Bunting to advise next steps/instructions for pt based on Dr Wyline Mood result notation, called pt to advise results still pending, pt understood

## 2013-07-30 NOTE — Telephone Encounter (Signed)
Pt called to check on his holter results, advised that the results are still pending however this nurse will contact Dr. Lewayne Bunting in the [address] office via phone to confirm results, apologized to pt for delay, please advise

## 2013-08-05 NOTE — Telephone Encounter (Signed)
Noted Dr. Lewayne Bunting in office today, given holter report and notation to review and advise, pending instructions per noted Dr. Lewayne Bunting In clinic with full day schedule at this time.

## 2013-08-05 NOTE — Telephone Encounter (Signed)
Noted Dr. Lewayne Bunting in office today, given verbal orders for pt to be informed his results did not present any concerns that need to be addressed at this time however the pt can come to see him in office in the next one to two months, pt understood all instructions and accepted apt for 09-11-13 at 2:15pm

## 2013-08-09 DIAGNOSIS — E119 Type 2 diabetes mellitus without complications: Secondary | ICD-10-CM | POA: Insufficient documentation

## 2013-08-15 ENCOUNTER — Telehealth: Payer: Self-pay | Admitting: Internal Medicine

## 2013-08-15 ENCOUNTER — Other Ambulatory Visit: Payer: Self-pay | Admitting: Internal Medicine

## 2013-08-15 ENCOUNTER — Other Ambulatory Visit: Payer: Self-pay | Admitting: *Deleted

## 2013-08-15 MED ORDER — FUROSEMIDE 40 MG PO TABS: 40.0000 mg | ORAL_TABLET | Freq: Every day | ORAL | Status: DC

## 2013-08-15 NOTE — Telephone Encounter (Signed)
New message     Yellow light on remote transmitter is staying on---can you help?

## 2013-08-15 NOTE — Telephone Encounter (Signed)
Instructed pt to unplug monitor for few seconds and then plug up. Pt has tried this several times with no success. Pt was then instructed to call [employer] for troubleshooting.

## 2013-09-11 ENCOUNTER — Ambulatory Visit (INDEPENDENT_AMBULATORY_CARE_PROVIDER_SITE_OTHER): Payer: Medicare HMO | Admitting: Internal Medicine

## 2013-09-11 ENCOUNTER — Encounter: Payer: Self-pay | Admitting: Internal Medicine

## 2013-09-11 VITALS — BP 134/64 | HR 74 | Ht 77.0 in | Wt 240.0 lb

## 2013-09-11 DIAGNOSIS — I5022 Chronic systolic (congestive) heart failure: Secondary | ICD-10-CM

## 2013-09-11 DIAGNOSIS — I4949 Other premature depolarization: Secondary | ICD-10-CM

## 2013-09-11 DIAGNOSIS — Z95810 Presence of automatic (implantable) cardiac defibrillator: Secondary | ICD-10-CM

## 2013-09-11 DIAGNOSIS — I493 Ventricular premature depolarization: Secondary | ICD-10-CM

## 2013-09-11 LAB — MDC_IDC_ENUM_SESS_TYPE_INCLINIC
Brady Statistic RV Percent Paced: 9 %
HIGH POWER IMPEDANCE MEASURED VALUE: 72 Ohm
Implantable Pulse Generator Model: 540
Lead Channel Pacing Threshold Amplitude: 0.6 V
Lead Channel Sensing Intrinsic Amplitude: 3.2 mV
MDC IDC MSMT BATTERY VOLTAGE: 3.02 V
MDC IDC MSMT LEADCHNL RV IMPEDANCE VALUE: 592 Ohm
MDC IDC MSMT LEADCHNL RV PACING THRESHOLD PULSEWIDTH: 0.4 ms
MDC IDC MSMT LEADCHNL RV SENSING INTR AMPL: 18.5 mV
MDC IDC PG SERIAL: 60687186
Zone Setting Detection Interval: 309.2 ms

## 2013-09-11 NOTE — Progress Notes (Signed)
HPI Wayne Gordon returns today for followup. He is a very pleasant 80 year old man with an ischemic cardiomyopathy, chronic well compensated systolic heart failure, mild renal insufficiency, stage II, diabetes, and hypertension. He has done well in the interim, status post ICD implantation. No ICD shocks. No syncope, and no peripheral edema. He does note some fatigue and weakness. He has not had palpitations. When I saw the patient several weeks ago, he was found to have frequent bursts of ventricular ectopy, but no sustained ventricular arrhythmias. Mostly he was in ventricular bigeminy. I had the patient wear a cardiac monitor her for 24 hours which demonstrated just less than 9000 PVCs, with no sustained ventricular arrhythmias. In the interim he has been stable. He denies syncope or ICD shock. Allergies  Allergen Reactions  . Penicillins Palpitations     Current Outpatient Prescriptions  Medication Sig Dispense Refill  . glipiZIDE (GLUCOTROL XL) 10 MG 24 hr tablet Take 10 mg by mouth daily with breakfast.      . aspirin EC 81 MG tablet Take 1 tablet (81 mg total) by mouth daily.      [name] Kitchen CALCIUM-MAGNESIUM-ZINC PO Take 2 tablets by mouth daily.      . carvedilol (COREG) 25 MG tablet Take 0.5 tablets (12.5 mg total) by mouth 2 (two) times daily. On hold until 04/12/13  60 tablet  6  . furosemide (LASIX) 40 MG tablet Take 1 tablet (40 mg total) by mouth daily. Takes extra 1/2 when needed  45 tablet  11  . losartan (COZAAR) 50 MG tablet Take 0.5 tablets (25 mg total) by mouth daily.  30 tablet  6  . Multiple Vitamins-Minerals (MULTIVITAMINS THER. W/MINERALS) TABS Take 1 tablet by mouth daily.      . Omega-3 Fatty Acids (FISH OIL) 1000 MG CAPS Take 1 capsule by mouth 2 (two) times daily.      [name] Kitchen omeprazole (PRILOSEC) 20 MG capsule Take 20 mg by mouth daily.      . rosuvastatin (CRESTOR) 20 MG tablet Take 20 mg by mouth every evening.      [name] Kitchen spironolactone (ALDACTONE) 25 MG tablet TAKE 1 TABLET DAILY   30 tablet  6  . testosterone cypionate (DEPOTESTOTERONE CYPIONATE) 200 MG/ML injection Inject 200 mg into the muscle every 14 (fourteen) days.       No current facility-administered medications for this visit.     Past Medical History  Diagnosis Date  . Hypertension   . Hyperlipidemia   . Heart murmur   . Shortness of breath     "lying down & w/exertion"  . Diabetes mellitus   . GERD (gastroesophageal reflux disease)   . Arthritis   . Chronic systolic heart failure   . Mitral regurgitation     S/P mitral valve repair [[id number]][  . Ischemic cardiomyopathy     S/P CABG x 3; EF 25%  . Carotid artery disease     60-79% LICA  . Allergy to ACE inhibitors     Angioedema  . Renal insufficiency   . Coronary artery disease   . Myocardial infarction   . Dysrhythmia     heart block  . ICD (implantable cardiac defibrillator) in place 03/28/2012  . CHF (congestive heart failure)     ROS:   All systems reviewed and negative except as noted in the HPI.   Past Surgical History  Procedure Laterality Date  . Knee arthroscopy  ~ 2008    right  . Nasal sinus surgery  1990's  right  . Mitral valve repair  10/17/2011    Procedure: MITRAL VALVE REPAIR (MVR);  Surgeon: Purcell Nailslarence H Owen, MD;  [facility];  Service: Open Heart Surgery;  Laterality: N/A;  . Coronary artery bypass graft  10/17/2011    Procedure: CORONARY ARTERY BYPASS GRAFTING (CABG);  Surgeon: Purcell Nailslarence H Owen, MD;  [facility];  Service: Open Heart Surgery;  Laterality: N/A;  . Icd  03/28/2012     Family History  Problem Relation Age of Onset  . Heart disease Mother      History   Social History  . Marital Status: Married    Spouse Name: N/A    Number of Children: N/A  . Years of Education: N/A   Occupational History  . Retired    Social History Main Topics  . Smoking status: Never Smoker   . Smokeless tobacco: Never Used  . Alcohol Use: No  . Drug Use: No  . Sexual Activity: Yes   Other Topics  Concern  . Not on file   Social History Narrative  . No narrative on file     BP 134/64  Pulse 74  Ht 6\' 5"  (1.956 m)  Wt 240 lb (108.863 kg)  BMI 28.45 kg/m2  SpO2 100%  Physical Exam:  Well appearing 80 year old man, NAD HEENT: Unremarkable Neck:  No JVD, no thyromegally Lungs:  Clear with no wheezes, rales, or rhonchi. HEART:  IRegular rate rhythm, no murmurs, no rubs, no clicks Abd:  soft, positive bowel sounds, no organomegally, no rebound, no guarding Ext:  2 plus pulses, no edema, no cyanosis, no clubbing Skin:  No rashes no nodules Neuro:  CN II through XII intact, motor grossly intact  DEVICE  Normal device function.  See PaceArt for details.   Holter monitor - normal sinus rhythm with frequent PVCs  Assess/Plan:

## 2013-09-11 NOTE — Assessment & Plan Note (Signed)
His ventricular ectopy appears to be fairly well-controlled. He is not symptomatic. As he has had less than 10,000 PVCs in a 24-hour period, I would recommend watchful waiting. Antiarrhythmic drug therapy would be a consideration but not indicated at the present time.

## 2013-09-11 NOTE — Assessment & Plan Note (Signed)
His Biotronik single chamber dual sensor ICD is working normally. He has had no atrial arrhythmias. We'll plan to recheck his device in several months.

## 2013-09-11 NOTE — Assessment & Plan Note (Signed)
His chronic systolic heart failure remains class II. He'll continue diuretic therapy, carvedilol, losartan, and Aldactone.

## 2013-09-11 NOTE — Patient Instructions (Addendum)
Your physician recommends that you schedule a follow-up appointment in: 12 months with Dr Ladona Ridgel Bonita Quin will receive a reminder letter two months in advance reminding you to call and schedule your appointment. If you don't receive this letter, please contact our office.  Your physician recommends that you continue on your current medications as directed. Please refer to the Current Medication list given to you today.  April 17th  Transmission

## 2013-09-12 ENCOUNTER — Encounter: Payer: Self-pay | Admitting: Internal Medicine

## 2013-09-30 DIAGNOSIS — Z6828 Body mass index (BMI) 28.0-28.9, adult: Secondary | ICD-10-CM | POA: Insufficient documentation

## 2013-10-31 ENCOUNTER — Encounter: Payer: Self-pay | Admitting: Internal Medicine

## 2013-12-13 ENCOUNTER — Encounter: Payer: Medicare HMO | Admitting: *Deleted

## 2013-12-25 ENCOUNTER — Encounter: Payer: Self-pay | Admitting: *Deleted

## 2013-12-27 ENCOUNTER — Encounter: Payer: Self-pay | Admitting: Internal Medicine

## 2013-12-27 ENCOUNTER — Ambulatory Visit (INDEPENDENT_AMBULATORY_CARE_PROVIDER_SITE_OTHER): Payer: Medicare HMO | Admitting: *Deleted

## 2013-12-27 DIAGNOSIS — I428 Other cardiomyopathies: Secondary | ICD-10-CM

## 2013-12-27 LAB — MDC_IDC_ENUM_SESS_TYPE_REMOTE
Brady Statistic RV Percent Paced: 16 %
HighPow Impedance: 83 Ohm
Implantable Pulse Generator Model: 540
Lead Channel Impedance Value: 563 Ohm
Lead Channel Sensing Intrinsic Amplitude: 16.7 mV
MDC IDC PG SERIAL: 60687186

## 2013-12-30 ENCOUNTER — Telehealth: Payer: Self-pay | Admitting: Internal Medicine

## 2013-12-30 NOTE — Telephone Encounter (Signed)
New message      Pt got letter stating his pacer transmission did not take place.  Patient has a wireless box. Please check to see if you got the transmission.

## 2013-12-30 NOTE — Telephone Encounter (Signed)
Attempted to call pt x4 but line was busy. Attempted between 4:00-5:05.

## 2013-12-31 NOTE — Telephone Encounter (Signed)
LMOVM stating we are receiving transmissions. He may disregard the letter he received. Also left my direct line.

## 2014-01-09 ENCOUNTER — Encounter: Payer: Self-pay | Admitting: Cardiology

## 2014-01-10 NOTE — Progress Notes (Signed)
Remote ICD transmission.   

## 2014-02-03 ENCOUNTER — Encounter (HOSPITAL_COMMUNITY): Payer: Self-pay | Admitting: Emergency Medicine

## 2014-02-03 ENCOUNTER — Inpatient Hospital Stay (HOSPITAL_COMMUNITY)
Admission: EM | Admit: 2014-02-03 | Discharge: 2014-02-05 | DRG: 378 | Disposition: A | Discharge status: Home / Self Care | Payer: Medicare HMO | Attending: Internal Medicine | Admitting: Internal Medicine

## 2014-02-03 ENCOUNTER — Emergency Department (HOSPITAL_COMMUNITY): Payer: Medicare HMO

## 2014-02-03 ENCOUNTER — Telehealth: Payer: Self-pay

## 2014-02-03 DIAGNOSIS — Z951 Presence of aortocoronary bypass graft: Secondary | ICD-10-CM

## 2014-02-03 DIAGNOSIS — D62 Acute posthemorrhagic anemia: Secondary | ICD-10-CM | POA: Diagnosis present

## 2014-02-03 DIAGNOSIS — N179 Acute kidney failure, unspecified: Secondary | ICD-10-CM | POA: Diagnosis present

## 2014-02-03 DIAGNOSIS — I5022 Chronic systolic (congestive) heart failure: Secondary | ICD-10-CM | POA: Diagnosis present

## 2014-02-03 DIAGNOSIS — Z7901 Long term (current) use of anticoagulants: Secondary | ICD-10-CM

## 2014-02-03 DIAGNOSIS — K219 Gastro-esophageal reflux disease without esophagitis: Secondary | ICD-10-CM | POA: Diagnosis present

## 2014-02-03 DIAGNOSIS — N289 Disorder of kidney and ureter, unspecified: Secondary | ICD-10-CM

## 2014-02-03 DIAGNOSIS — I2589 Other forms of chronic ischemic heart disease: Secondary | ICD-10-CM | POA: Diagnosis present

## 2014-02-03 DIAGNOSIS — Z7982 Long term (current) use of aspirin: Secondary | ICD-10-CM

## 2014-02-03 DIAGNOSIS — E119 Type 2 diabetes mellitus without complications: Secondary | ICD-10-CM | POA: Diagnosis present

## 2014-02-03 DIAGNOSIS — K625 Hemorrhage of anus and rectum: Secondary | ICD-10-CM

## 2014-02-03 DIAGNOSIS — I255 Ischemic cardiomyopathy: Secondary | ICD-10-CM

## 2014-02-03 DIAGNOSIS — E785 Hyperlipidemia, unspecified: Secondary | ICD-10-CM | POA: Diagnosis present

## 2014-02-03 DIAGNOSIS — Z95810 Presence of automatic (implantable) cardiac defibrillator: Secondary | ICD-10-CM

## 2014-02-03 DIAGNOSIS — I2510 Atherosclerotic heart disease of native coronary artery without angina pectoris: Secondary | ICD-10-CM | POA: Diagnosis present

## 2014-02-03 DIAGNOSIS — Z79899 Other long term (current) drug therapy: Secondary | ICD-10-CM

## 2014-02-03 DIAGNOSIS — Z9889 Other specified postprocedural states: Secondary | ICD-10-CM

## 2014-02-03 DIAGNOSIS — I509 Heart failure, unspecified: Secondary | ICD-10-CM | POA: Diagnosis present

## 2014-02-03 DIAGNOSIS — I252 Old myocardial infarction: Secondary | ICD-10-CM

## 2014-02-03 DIAGNOSIS — K922 Gastrointestinal hemorrhage, unspecified: Secondary | ICD-10-CM

## 2014-02-03 DIAGNOSIS — K5790 Diverticulosis of intestine, part unspecified, without perforation or abscess without bleeding: Secondary | ICD-10-CM

## 2014-02-03 DIAGNOSIS — K5731 Diverticulosis of large intestine without perforation or abscess with bleeding: Principal | ICD-10-CM | POA: Diagnosis present

## 2014-02-03 DIAGNOSIS — I6529 Occlusion and stenosis of unspecified carotid artery: Secondary | ICD-10-CM | POA: Diagnosis present

## 2014-02-03 DIAGNOSIS — E860 Dehydration: Secondary | ICD-10-CM | POA: Diagnosis present

## 2014-02-03 DIAGNOSIS — I10 Essential (primary) hypertension: Secondary | ICD-10-CM | POA: Diagnosis present

## 2014-02-03 DIAGNOSIS — I951 Orthostatic hypotension: Secondary | ICD-10-CM | POA: Diagnosis present

## 2014-02-03 DIAGNOSIS — E875 Hyperkalemia: Secondary | ICD-10-CM | POA: Diagnosis present

## 2014-02-03 DIAGNOSIS — M1990 Unspecified osteoarthritis, unspecified site: Secondary | ICD-10-CM | POA: Diagnosis present

## 2014-02-03 LAB — CBC WITH DIFFERENTIAL/PLATELET
BASOS ABS: 0 10*3/uL (ref 0.0–0.1)
Basophils Relative: 0 % (ref 0–1)
EOS PCT: 2 % (ref 0–5)
Eosinophils Absolute: 0.1 10*3/uL (ref 0.0–0.7)
HCT: 37.1 % — ABNORMAL LOW (ref 39.0–52.0)
Hemoglobin: 12 g/dL — ABNORMAL LOW (ref 13.0–17.0)
LYMPHS PCT: 16 % (ref 12–46)
Lymphs Abs: 1.4 10*3/uL (ref 0.7–4.0)
MCH: 27.5 pg (ref 26.0–34.0)
MCHC: 32.3 g/dL (ref 30.0–36.0)
MCV: 85.1 fL (ref 78.0–100.0)
Monocytes Absolute: 0.8 10*3/uL (ref 0.1–1.0)
Monocytes Relative: 9 % (ref 3–12)
NEUTROS ABS: 6.4 10*3/uL (ref 1.7–7.7)
Neutrophils Relative %: 73 % (ref 43–77)
PLATELETS: 212 10*3/uL (ref 150–400)
RBC: 4.36 MIL/uL (ref 4.22–5.81)
RDW: 16.4 % — AB (ref 11.5–15.5)
WBC: 8.8 10*3/uL (ref 4.0–10.5)

## 2014-02-03 LAB — COMPREHENSIVE METABOLIC PANEL
ALK PHOS: 42 U/L (ref 39–117)
ALT: 21 U/L (ref 0–53)
AST: 17 U/L (ref 0–37)
Albumin: 3.4 g/dL — ABNORMAL LOW (ref 3.5–5.2)
BILIRUBIN TOTAL: 0.5 mg/dL (ref 0.3–1.2)
BUN: 46 mg/dL — AB (ref 6–23)
CHLORIDE: 103 meq/L (ref 96–112)
CO2: 26 mEq/L (ref 19–32)
Calcium: 8.8 mg/dL (ref 8.4–10.5)
Creatinine, Ser: 2.52 mg/dL — ABNORMAL HIGH (ref 0.50–1.35)
GFR, EST AFRICAN AMERICAN: 27 mL/min — AB (ref >90)
GFR, EST NON AFRICAN AMERICAN: 23 mL/min — AB (ref >90)
GLUCOSE: 134 mg/dL — AB (ref 70–99)
POTASSIUM: 5.7 meq/L — AB (ref 3.7–5.3)
Sodium: 140 mEq/L (ref 137–147)
Total Protein: 6.9 g/dL (ref 6.0–8.3)

## 2014-02-03 LAB — URINALYSIS, ROUTINE W REFLEX MICROSCOPIC
BILIRUBIN URINE: NEGATIVE
GLUCOSE, UA: NEGATIVE mg/dL
HGB URINE DIPSTICK: NEGATIVE
Ketones, ur: NEGATIVE mg/dL
Leukocytes, UA: NEGATIVE
Nitrite: NEGATIVE
PROTEIN: NEGATIVE mg/dL
Specific Gravity, Urine: 1.015 (ref 1.005–1.030)
Urobilinogen, UA: 0.2 mg/dL (ref 0.0–1.0)
pH: 5 (ref 5.0–8.0)

## 2014-02-03 LAB — TROPONIN I

## 2014-02-03 LAB — CBC
HCT: 30.4 % — ABNORMAL LOW (ref 39.0–52.0)
HEMOGLOBIN: 9.9 g/dL — AB (ref 13.0–17.0)
MCH: 27.6 pg (ref 26.0–34.0)
MCHC: 32.6 g/dL (ref 30.0–36.0)
MCV: 84.7 fL (ref 78.0–100.0)
Platelets: 199 10*3/uL (ref 150–400)
RBC: 3.59 MIL/uL — AB (ref 4.22–5.81)
RDW: 16.3 % — ABNORMAL HIGH (ref 11.5–15.5)
WBC: 8.8 10*3/uL (ref 4.0–10.5)

## 2014-02-03 LAB — LIPASE, BLOOD: Lipase: 61 U/L — ABNORMAL HIGH (ref 11–59)

## 2014-02-03 LAB — CBG MONITORING, ED: GLUCOSE-CAPILLARY: 173 mg/dL — AB (ref 70–99)

## 2014-02-03 LAB — TYPE AND SCREEN
ABO/RH(D): A POS
Antibody Screen: NEGATIVE

## 2014-02-03 LAB — MRSA PCR SCREENING: MRSA BY PCR: NEGATIVE

## 2014-02-03 LAB — PRO B NATRIURETIC PEPTIDE: Pro B Natriuretic peptide (BNP): 2112 pg/mL — ABNORMAL HIGH (ref 0–450)

## 2014-02-03 MED ORDER — ONDANSETRON HCL 4 MG PO TABS: 4.0000 mg | ORAL_TABLET | Freq: Four times a day (QID) | ORAL | Status: DC | PRN

## 2014-02-03 MED ORDER — SODIUM CHLORIDE 0.9 % IV BOLUS (SEPSIS)
250.0000 mL | Freq: Once | INTRAVENOUS | Status: AC
  Administered 2014-02-03: 250 mL via INTRAVENOUS

## 2014-02-03 MED ORDER — INSULIN ASPART 100 UNIT/ML ~~LOC~~ SOLN
0.0000 [IU] | Freq: Three times a day (TID) | SUBCUTANEOUS | Status: DC
Start: 2014-02-04 — End: 2014-02-04

## 2014-02-03 MED ORDER — ACETAMINOPHEN 650 MG RE SUPP: 650.0000 mg | Freq: Four times a day (QID) | RECTAL | Status: DC | PRN

## 2014-02-03 MED ORDER — SODIUM CHLORIDE 0.9 % IV SOLN: INTRAVENOUS | Status: DC

## 2014-02-03 MED ORDER — ONDANSETRON HCL 4 MG/2ML IJ SOLN: 4.0000 mg | Freq: Four times a day (QID) | INTRAMUSCULAR | Status: DC | PRN

## 2014-02-03 MED ORDER — SODIUM CHLORIDE 0.9 % IV SOLN
INTRAVENOUS | Status: AC
  Administered 2014-02-04: 21:00:00 via INTRAVENOUS

## 2014-02-03 MED ORDER — ACETAMINOPHEN 325 MG PO TABS: 650.0000 mg | ORAL_TABLET | Freq: Four times a day (QID) | ORAL | Status: DC | PRN

## 2014-02-03 MED ORDER — SODIUM CHLORIDE 0.9 % IV BOLUS (SEPSIS): 500.0000 mL | Freq: Once | INTRAVENOUS | Status: DC

## 2014-02-03 MED ORDER — SODIUM CHLORIDE 0.9 % IV SOLN
INTRAVENOUS | Status: DC
  Administered 2014-02-03: 1000 mL via INTRAVENOUS

## 2014-02-03 MED ORDER — PANTOPRAZOLE SODIUM 40 MG IV SOLR
40.0000 mg | Freq: Two times a day (BID) | INTRAVENOUS | Status: DC
  Administered 2014-02-03 – 2014-02-04 (×3): 40 mg via INTRAVENOUS
  Filled 2014-02-03 (×6): qty 40

## 2014-02-03 NOTE — Telephone Encounter (Signed)
Patient called to report with his BM this am he noted bright red blood covering stool.He later felt like he was going to have "the squirts" and "filled" the toilet with red-maroon colored blood.No c/o abdominal pain.Does report slight dizziness after initial stool.He reports his pcp is closed for lunch.patient has decided to go to [facility] ED vs waiting to call pcp

## 2014-02-03 NOTE — ED Notes (Signed)
Pt reports 3 episodes of diarrhea with blood while in the ER. States mid abdominal pain 1/10. Declined pain meds for mild abdominal pain.

## 2014-02-03 NOTE — ED Provider Notes (Addendum)
CSN: [id number]     Arrival date & time 02/03/14  1427 History   First MD Initiated Contact with Patient 02/03/14 1534     Chief Complaint  Patient presents with  . Rectal Bleeding     (Consider location/radiation/quality/duration/timing/severity/associated sxs/prior Treatment) Patient is a 80 y.o. male presenting with hematochezia. The history is provided by the patient and the spouse.  Rectal Bleeding Associated symptoms: no abdominal pain, no fever and no vomiting    patient with onset of rectal bleeding red blood several episodes today at least 6-7 prior to arrival. No lightheadedness no chest pain no shortness of breath did not feel like he was going to pass out. No history of rectal bleeding in the past. Patient is not on Coumadin or significant blood thinners. Patient has a history of diabetes has coronary artery disease has a cardiac defibrillator in place. Patient's cardiologist is with [facility]. The abdominal pain.  Past Medical History  Diagnosis Date  . Hypertension   . Hyperlipidemia   . Heart murmur   . Shortness of breath     "lying down & w/exertion"  . Diabetes mellitus   . GERD (gastroesophageal reflux disease)   . Arthritis   . Chronic systolic heart failure   . Mitral regurgitation     S/P mitral valve repair [[id number]][  . Ischemic cardiomyopathy     S/P CABG x 3; EF 25%  . Carotid artery disease     29-93% LICA  . Allergy to ACE inhibitors     Angioedema  . Renal insufficiency   . Coronary artery disease   . Myocardial infarction   . Dysrhythmia     heart block  . ICD (implantable cardiac defibrillator) in place 03/28/2012  . CHF (congestive heart failure)    Past Surgical History  Procedure Laterality Date  . Knee arthroscopy  ~ 2008    right  . Nasal sinus surgery  1990's    right  . Mitral valve repair  10/17/2011    Procedure: MITRAL VALVE REPAIR (MVR);  Surgeon: Rexene Alberts, MD;  Location: [place];  Service: Open Heart Surgery;  Laterality:  N/A;  . Coronary artery bypass graft  10/17/2011    Procedure: CORONARY ARTERY BYPASS GRAFTING (CABG);  Surgeon: Rexene Alberts, MD;  Location: [place];  Service: Open Heart Surgery;  Laterality: N/A;  . Icd  03/28/2012   Family History  Problem Relation Age of Onset  . Heart disease Mother    History  Substance Use Topics  . Smoking status: Never Smoker   . Smokeless tobacco: Never Used  . Alcohol Use: No    Review of Systems  Constitutional: Negative for fever.  HENT: Negative for congestion.   Eyes: Negative for visual disturbance.  Respiratory: Negative for shortness of breath.   Cardiovascular: Negative for chest pain.  Gastrointestinal: Positive for blood in stool and hematochezia. Negative for nausea, vomiting and abdominal pain.  Genitourinary: Negative for dysuria and hematuria.  Musculoskeletal: Negative for back pain.  Skin: Negative for rash.  Neurological: Negative for headaches.  Hematological: Does not bruise/bleed easily.  Psychiatric/Behavioral: Negative for confusion.      Allergies  Penicillins  Home Medications   Prior to Admission medications   Medication Sig Start Date End Date Taking? Authorizing Provider  aspirin EC 81 MG tablet Take 1 tablet (81 mg total) by mouth daily. 01/03/13  Yes Donney Dice, PA-C  CALCIUM-MAGNESIUM-ZINC PO Take 2 tablets by mouth daily.   Yes  Historical Provider, MD  carvedilol (COREG) 25 MG tablet Take 0.5 tablets (12.5 mg total) by mouth 2 (two) times daily. On hold until 04/12/13 04/19/13 04/19/14 Yes Lendon Colonel, NP  furosemide (LASIX) 40 MG tablet Take 1 tablet (40 mg total) by mouth daily. Takes extra 1/2 when needed 08/15/13 08/17/14 Yes Evans Lance, MD  glipiZIDE (GLUCOTROL XL) 10 MG 24 hr tablet Take 10 mg by mouth daily with breakfast.   Yes Historical Provider, MD  losartan (COZAAR) 50 MG tablet Take 0.5 tablets (25 mg total) by mouth daily. 04/03/13  Yes Donney Dice, PA-C  Multiple Vitamins-Minerals  (MULTIVITAMINS THER. W/MINERALS) TABS Take 1 tablet by mouth daily.   Yes Historical Provider, MD  Omega-3 Fatty Acids (FISH OIL) 1000 MG CAPS Take 1 capsule by mouth 2 (two) times daily.   Yes Historical Provider, MD  omeprazole (PRILOSEC) 20 MG capsule Take 20 mg by mouth daily.   Yes Historical Provider, MD  rosuvastatin (CRESTOR) 20 MG tablet Take 20 mg by mouth every evening.   Yes Historical Provider, MD  spironolactone (ALDACTONE) 25 MG tablet Take 25 mg by mouth daily.   Yes Historical Provider, MD  testosterone cypionate (DEPOTESTOTERONE CYPIONATE) 200 MG/ML injection Inject 200 mg into the muscle every 14 (fourteen) days.    Historical Provider, MD   BP 109/54  Pulse 61  Temp(Src) 97.8 F (36.6 C) (Oral)  Resp 19  SpO2 95% Physical Exam  Nursing note and vitals reviewed. Constitutional: He is oriented to person, place, and time. He appears well-developed and well-nourished. No distress.  HENT:  Head: Normocephalic and atraumatic.  Mouth/Throat: Oropharynx is clear and moist.  Eyes: Conjunctivae are normal. Pupils are equal, round, and reactive to light.  Neck: Normal range of motion.  Cardiovascular: Normal rate, regular rhythm and normal heart sounds.   Pulmonary/Chest: Effort normal and breath sounds normal. No respiratory distress.  Abdominal: Soft. Bowel sounds are normal. There is no tenderness.  Genitourinary: Guaiac positive stool.  Red blood in rectal vault. No clots. No stool. No fissures no external hemorrhoids no internal prolapsed hemorrhoids.  Musculoskeletal: Normal range of motion. He exhibits no edema.  Neurological: He is alert and oriented to person, place, and time. No cranial nerve deficit. He exhibits normal muscle tone. Coordination normal.  Skin: Skin is warm. No rash noted.    ED Course  Procedures (including critical care time) Labs Review Labs Reviewed  COMPREHENSIVE METABOLIC PANEL - Abnormal; Notable for the following:    Potassium 5.7 (*)     Glucose, Bld 134 (*)    BUN 46 (*)    Creatinine, Ser 2.52 (*)    Albumin 3.4 (*)    GFR calc non Af Amer 23 (*)    GFR calc Af Amer 27 (*)    All other components within normal limits  CBC WITH DIFFERENTIAL - Abnormal; Notable for the following:    Hemoglobin 12.0 (*)    HCT 37.1 (*)    RDW 16.4 (*)    All other components within normal limits  LIPASE, BLOOD - Abnormal; Notable for the following:    Lipase 61 (*)    All other components within normal limits  PRO B NATRIURETIC PEPTIDE - Abnormal; Notable for the following:    Pro B Natriuretic peptide (BNP) 2112.0 (*)    All other components within normal limits  URINALYSIS, ROUTINE W REFLEX MICROSCOPIC  TYPE AND SCREEN   Results for orders placed during the hospital encounter of  02/03/14  COMPREHENSIVE METABOLIC PANEL      Result Value Ref Range   Sodium 140  137 - 147 mEq/L   Potassium 5.7 (*) 3.7 - 5.3 mEq/L   Chloride 103  96 - 112 mEq/L   CO2 26  19 - 32 mEq/L   Glucose, Bld 134 (*) 70 - 99 mg/dL   BUN 46 (*) 6 - 23 mg/dL   Creatinine, Ser 2.52 (*) 0.50 - 1.35 mg/dL   Calcium 8.8  8.4 - 10.5 mg/dL   Total Protein 6.9  6.0 - 8.3 g/dL   Albumin 3.4 (*) 3.5 - 5.2 g/dL   AST 17  0 - 37 U/L   ALT 21  0 - 53 U/L   Alkaline Phosphatase 42  39 - 117 U/L   Total Bilirubin 0.5  0.3 - 1.2 mg/dL   GFR calc non Af Amer 23 (*) >90 mL/min   GFR calc Af Amer 27 (*) >90 mL/min  CBC WITH DIFFERENTIAL      Result Value Ref Range   WBC 8.8  4.0 - 10.5 K/uL   RBC 4.36  4.22 - 5.81 MIL/uL   Hemoglobin 12.0 (*) 13.0 - 17.0 g/dL   HCT 37.1 (*) 39.0 - 52.0 %   MCV 85.1  78.0 - 100.0 fL   MCH 27.5  26.0 - 34.0 pg   MCHC 32.3  30.0 - 36.0 g/dL   RDW 16.4 (*) 11.5 - 15.5 %   Platelets 212  150 - 400 K/uL   Neutrophils Relative % 73  43 - 77 %   Neutro Abs 6.4  1.7 - 7.7 K/uL   Lymphocytes Relative 16  12 - 46 %   Lymphs Abs 1.4  0.7 - 4.0 K/uL   Monocytes Relative 9  3 - 12 %   Monocytes Absolute 0.8  0.1 - 1.0 K/uL   Eosinophils  Relative 2  0 - 5 %   Eosinophils Absolute 0.1  0.0 - 0.7 K/uL   Basophils Relative 0  0 - 1 %   Basophils Absolute 0.0  0.0 - 0.1 K/uL  LIPASE, BLOOD      Result Value Ref Range   Lipase 61 (*) 11 - 59 U/L  URINALYSIS, ROUTINE W REFLEX MICROSCOPIC      Result Value Ref Range   Color, Urine YELLOW  YELLOW   APPearance CLEAR  CLEAR   Specific Gravity, Urine 1.015  1.005 - 1.030   pH 5.0  5.0 - 8.0   Glucose, UA NEGATIVE  NEGATIVE mg/dL   Hgb urine dipstick NEGATIVE  NEGATIVE   Bilirubin Urine NEGATIVE  NEGATIVE   Ketones, ur NEGATIVE  NEGATIVE mg/dL   Protein, ur NEGATIVE  NEGATIVE mg/dL   Urobilinogen, UA 0.2  0.0 - 1.0 mg/dL   Nitrite NEGATIVE  NEGATIVE   Leukocytes, UA NEGATIVE  NEGATIVE  PRO B NATRIURETIC PEPTIDE      Result Value Ref Range   Pro B Natriuretic peptide (BNP) 2112.0 (*) 0 - 450 pg/mL  TYPE AND SCREEN      Result Value Ref Range   ABO/RH(D) A POS     Antibody Screen NEG     Sample Expiration 02/06/2014       Imaging Review Ct Abdomen Pelvis Wo Contrast  02/03/2014   CLINICAL DATA:  Bright red blood in stool.  EXAM: CT ABDOMEN AND PELVIS WITHOUT CONTRAST  TECHNIQUE: Multidetector CT imaging of the abdomen and pelvis was performed following the standard protocol without  IV contrast.  COMPARISON:  None.  FINDINGS: There is some mild atelectasis in the right lung base. Lung bases are otherwise clear.  The liver, gallbladder, adrenal glands, spleen, pancreas and kidneys are unremarkable. The patient has extensive colonic diverticulosis, worst in the sigmoid and descending colon. No evidence of diverticulitis is identified. No mass is seen. The prostate gland is mildly prominent. Urinary bladder appears unremarkable. Extensive aortoiliac atherosclerosis without aneurysm is noted. Tiny lucent lesions are seen in the pelvic bones appear to be fat attenuating and may reflect osteopenia.  IMPRESSION: No acute finding  Extensive diverticulosis without evidence of  diverticulitis.  Mild enlargement of prostate gland.  Punctate lucent lesions in the pelvic bones bilaterally may reflect osteopenia but cannot be definitively characterized. Clinical correlation for multiple myeloma is recommended.   Electronically Signed   By: Inge Rise M.D.   On: 02/03/2014 18:34   Dg Chest 2 View  02/03/2014   CLINICAL DATA:  Rectal bleeding with diarrhea.  EXAM: CHEST  2 VIEW  COMPARISON:  07/16/2012.  FINDINGS: Trachea is midline. Heart size stable. Left subclavian ICD lead tip projects over the right ventricle. Right hemidiaphragm is markedly elevated, as before. Scarring at the right lung base. Lungs are otherwise clear. No pleural fluid. Flowing anterior osteophytosis in the thoracic spine.  IMPRESSION: No acute findings.   Electronically Signed   By: Lorin Picket M.D.   On: 02/03/2014 17:13     EKG Interpretation None      MDM   Final diagnoses:  Rectal bleeding  Renal insufficiency  Diverticulosis    Patient with multiple bowel movements with red blood today. Since being in the emergency department Saturday 6 more. CT scan negative except for diverticulosis. Patient hemodynamically stable. Hemoglobin and hematocrit without significant abnormalities. Patient is known to have some renal insufficiency based on today's creatinine that is worse. Potassium is normal. Patient will require admission and observation. Patient does not require blood transfusion at this time. Patient's primary care Dr. is in the [facility] area. Patient is an unassigned admission.    Fredia Sorrow, MD 02/03/14 1927    Addendum: Patient now starting to get symptomatic little lightheaded when up and about. Have ordered another hemoglobin and hematocrit checked. Patient sore he been typed and screened. Patient has admitting orders put in for the step down unit. Patient may require blood transfusion at this point in time. Patient has orthostatic hypotension symptomatic however  at rest blood pressure is still 1:66 systolic. Patient is not tachycardic.     Fredia Sorrow, MD 02/03/14 2014

## 2014-02-03 NOTE — ED Notes (Addendum)
Admitting MD at bedside.

## 2014-02-03 NOTE — ED Notes (Signed)
Pt reports rectal bleeding since 1130 this am. Denies nausea, vomiting, diarrhea with the bleed. Denies blood thinners other than baby asa. Denies pain at this time.

## 2014-02-03 NOTE — ED Notes (Signed)
Patient transported to CT 

## 2014-02-03 NOTE — ED Notes (Signed)
Attempted to give report 

## 2014-02-03 NOTE — ED Notes (Addendum)
Pt reports black stools at 1130 today; diarr every 30 mins. Black and bloody. Denies chest pain and SOB. Called [facility] and they said to come in to ER. Takes aspirin but no thinners.

## 2014-02-03 NOTE — H&P (Signed)
[facility] History and Physical  Lovelace Cerveny Zaremba RFF:[id number] DOB: July 05, 1934 DOA: 02/03/2014  Referring physician: ER physician. PCP: Octavio Graves, DO   Chief Complaint: Rectal bleeding.  HPI: Wayne Gordon is a 80 y.o. male with history of ischemic cardiomyopathy, CAD status post CABG, mitral valve repair, ICD placement, diabetes, hypertension presented to the ER because of painless rectal bleeding since afternoon. Patient states he had at least 5-6 episodes of painless rectal bleeding. Denies any nausea vomiting fever chills use of any recent antibiotics. Patient has been recently started on diclofenac for low back pain. In the ER patient had CT abdomen and pelvis which shows diverticulosis and also abnormal lesions in the pelvic bone. Patient had further episodes of frank rectal bleeding in the ER. Patient's hemoglobin was around 12 and will be admitted for further management. Patient denies any chest pain shortness of breath. In the ER patient became orthostatic and was mildly diaphoretic after the recent episode of rectal bleed. Patient has not had previous colonoscopy as per the patient.   Review of Systems: As presented in the history of presenting illness, rest negative.  Past Medical History  Diagnosis Date  . Hypertension   . Hyperlipidemia   . Heart murmur   . Shortness of breath     "lying down & w/exertion"  . Diabetes mellitus   . GERD (gastroesophageal reflux disease)   . Arthritis   . Chronic systolic heart failure   . Mitral regurgitation     S/P mitral valve repair [[id number]][  . Ischemic cardiomyopathy     S/P CABG x 3; EF 25%  . Carotid artery disease     35-70% LICA  . Allergy to ACE inhibitors     Angioedema  . Renal insufficiency   . Coronary artery disease   . Myocardial infarction   . Dysrhythmia     heart block  . ICD (implantable cardiac defibrillator) in place 03/28/2012  . CHF (congestive heart failure)    Past Surgical History  Procedure  Laterality Date  . Knee arthroscopy  ~ 2008    right  . Nasal sinus surgery  1990's    right  . Mitral valve repair  10/17/2011    Procedure: MITRAL VALVE REPAIR (MVR);  Surgeon: Rexene Alberts, MD;  Location: [facility];  Service: Open Heart Surgery;  Laterality: N/A;  . Coronary artery bypass graft  10/17/2011    Procedure: CORONARY ARTERY BYPASS GRAFTING (CABG);  Surgeon: Rexene Alberts, MD;  Location: [facility];  Service: Open Heart Surgery;  Laterality: N/A;  . Icd  03/28/2012   Social History:  reports that he has never smoked. He has never used smokeless tobacco. He reports that he does not drink alcohol or use illicit drugs. Where does patient live home. Can patient participate in ADLs? Yes.  Allergies  Allergen Reactions  . Ace Inhibitors   . Penicillins Palpitations    Family History:  Family History  Problem Relation Age of Onset  . Heart disease Mother       Prior to Admission medications   Medication Sig Start Date End Date Taking? Authorizing Provider  aspirin EC 81 MG tablet Take 1 tablet (81 mg total) by mouth daily. 01/03/13  Yes Donney Dice, PA-C  CALCIUM-MAGNESIUM-ZINC PO Take 2 tablets by mouth daily.   Yes Historical Provider, MD  carvedilol (COREG) 25 MG tablet Take 0.5 tablets (12.5 mg total) by mouth 2 (two) times daily. On hold until 04/12/13 04/19/13 04/19/14 Yes  Lendon Colonel, NP  furosemide (LASIX) 40 MG tablet Take 1 tablet (40 mg total) by mouth daily. Takes extra 1/2 when needed 08/15/13 08/17/14 Yes Evans Lance, MD  glipiZIDE (GLUCOTROL XL) 10 MG 24 hr tablet Take 10 mg by mouth daily with breakfast.   Yes Historical Provider, MD  losartan (COZAAR) 50 MG tablet Take 0.5 tablets (25 mg total) by mouth daily. 04/03/13  Yes Donney Dice, PA-C  Multiple Vitamins-Minerals (MULTIVITAMINS THER. W/MINERALS) TABS Take 1 tablet by mouth daily.   Yes Historical Provider, MD  Omega-3 Fatty Acids (FISH OIL) 1000 MG CAPS Take 1 capsule by mouth 2 (two) times daily.    Yes Historical Provider, MD  omeprazole (PRILOSEC) 20 MG capsule Take 20 mg by mouth daily.   Yes Historical Provider, MD  rosuvastatin (CRESTOR) 20 MG tablet Take 20 mg by mouth every evening.   Yes Historical Provider, MD  spironolactone (ALDACTONE) 25 MG tablet Take 25 mg by mouth daily.   Yes Historical Provider, MD  testosterone cypionate (DEPOTESTOTERONE CYPIONATE) 200 MG/ML injection Inject 200 mg into the muscle every 14 (fourteen) days.    Historical Provider, MD    Physical Exam: Filed Vitals:   02/03/14 2015 02/03/14 2030 02/03/14 2118 02/03/14 2130  BP: 112/48 102/62 119/76 119/58  Pulse: 63 63 63 62  Temp:   98.1 F (36.7 C)   TempSrc:   Oral   Resp: _0 Height:   _1  (1.956 m)   Weight:   109.1 kg (240 lb 8.4 oz)   SpO2: 96% 98% 99% 99%     General:  Well-developed and nourished.  Eyes: Anicteric no pallor.  ENT: No discharge from the ears eyes nose mouth.  Neck: No mass felt.  Cardiovascular: S1-S2 heard.  Respiratory: No rhonchi or crepitations.  Abdomen: Soft nontender bowel sounds present. No guarding or rigidity.  Skin: No rash.  Musculoskeletal: No edema.  Psychiatric: Appears normal.  Neurologic: Alert awake oriented to time place and person. Moves all extremities.  Labs on Admission:  Basic Metabolic Panel:  Recent Labs Lab 02/03/14 1500  NA 140  K 5.7*  CL 103  CO2 26  GLUCOSE 134*  BUN 46*  CREATININE 2.52*  CALCIUM 8.8   Liver Function Tests:  Recent Labs Lab 02/03/14 1500  AST 17  ALT 21  ALKPHOS 42  BILITOT 0.5  PROT 6.9  ALBUMIN 3.4*    Recent Labs Lab 02/03/14 1555  LIPASE 61*   No results found for this basename: AMMONIA,  in the last 168 hours CBC:  Recent Labs Lab 02/03/14 1500 02/03/14 2048  WBC 8.8 8.8  NEUTROABS 6.4  --   HGB 12.0* 9.9*  HCT 37.1* 30.4*  MCV 85.1 84.7  PLT 212 199   Cardiac Enzymes: No results found for this basename: CKTOTAL, CKMB, CKMBINDEX, TROPONINI,  in the  last 168 hours  BNP (last 3 results)  Recent Labs  02/03/14 1555  PROBNP 2112.0*   CBG:  Recent Labs Lab 02/03/14 2045  GLUCAP 173*    Radiological Exams on Admission: Ct Abdomen Pelvis Wo Contrast  02/03/2014   CLINICAL DATA:  Bright red blood in stool.  EXAM: CT ABDOMEN AND PELVIS WITHOUT CONTRAST  TECHNIQUE: Multidetector CT imaging of the abdomen and pelvis was performed following the standard protocol without IV contrast.  COMPARISON:  None.  FINDINGS: There is some mild atelectasis in the right lung base. Lung bases are otherwise clear.  The liver,  gallbladder, adrenal glands, spleen, pancreas and kidneys are unremarkable. The patient has extensive colonic diverticulosis, worst in the sigmoid and descending colon. No evidence of diverticulitis is identified. No mass is seen. The prostate gland is mildly prominent. Urinary bladder appears unremarkable. Extensive aortoiliac atherosclerosis without aneurysm is noted. Tiny lucent lesions are seen in the pelvic bones appear to be fat attenuating and may reflect osteopenia.  IMPRESSION: No acute finding  Extensive diverticulosis without evidence of diverticulitis.  Mild enlargement of prostate gland.  Punctate lucent lesions in the pelvic bones bilaterally may reflect osteopenia but cannot be definitively characterized. Clinical correlation for multiple myeloma is recommended.   Electronically Signed   By: Inge Rise M.D.   On: 02/03/2014 18:34   Dg Chest 2 View  02/03/2014   CLINICAL DATA:  Rectal bleeding with diarrhea.  EXAM: CHEST  2 VIEW  COMPARISON:  07/16/2012.  FINDINGS: Trachea is midline. Heart size stable. Left subclavian ICD lead tip projects over the right ventricle. Right hemidiaphragm is markedly elevated, as before. Scarring at the right lung base. Lungs are otherwise clear. No pleural fluid. Flowing anterior osteophytosis in the thoracic spine.  IMPRESSION: No acute findings.   Electronically Signed   By: Lorin Picket  M.D.   On: 02/03/2014 17:13    EKG: Independently reviewed. Normal sinus rhythm.  Assessment/Plan Principal Problem:   GI bleed Active Problems:   S/P CABG x 3   Chronic systolic heart failure   HTN (hypertension)   ARF (acute renal failure)   1. Rectal bleeding - most likely source could be diverticulosis. I did discuss with on-call gastroenterologist Dr. Collene Mares who will be seeing patient in consult. Patient will be kept n.p.o. in anticipation of procedure. Protonix I.V. Closely follow CBC. Type and screen. Given the patient's cardiac history we'll transfuse if hemoglobin less than 8. 2. Acute renal failure with hyperkalemia - as patient is orthostatic we're holding ARB and diuretics. Renal failure probably further worsened by NSAID use. I have told patient not use NSAIDs. Continue gentle hydration. Check UA and FeNa. Closely follow intake output and metabolic panel. 3. CAD status post CABG - patient was diaphoretic. Denies any chest pain. Check troponin. 4. Hypertension - see #2. 5. Ischemic cardiomyopathy - see #2. 6. Anemia probably from blood loss - follow CBC.    Code Status: Full code.  Family Communication: Patient's wife at the bedside.  Disposition Plan: Admit to inpatient.    [facility] Pager [phone].  If 7PM-7AM, please contact night-coverage [email or web address] Password [facility] 02/03/2014, 9:40 PM

## 2014-02-04 DIAGNOSIS — E785 Hyperlipidemia, unspecified: Secondary | ICD-10-CM

## 2014-02-04 DIAGNOSIS — K922 Gastrointestinal hemorrhage, unspecified: Secondary | ICD-10-CM

## 2014-02-04 DIAGNOSIS — Z951 Presence of aortocoronary bypass graft: Secondary | ICD-10-CM

## 2014-02-04 DIAGNOSIS — K5730 Diverticulosis of large intestine without perforation or abscess without bleeding: Secondary | ICD-10-CM

## 2014-02-04 DIAGNOSIS — N289 Disorder of kidney and ureter, unspecified: Secondary | ICD-10-CM

## 2014-02-04 DIAGNOSIS — Z9889 Other specified postprocedural states: Secondary | ICD-10-CM

## 2014-02-04 DIAGNOSIS — Z95810 Presence of automatic (implantable) cardiac defibrillator: Secondary | ICD-10-CM

## 2014-02-04 DIAGNOSIS — Z7901 Long term (current) use of anticoagulants: Secondary | ICD-10-CM

## 2014-02-04 DIAGNOSIS — I2589 Other forms of chronic ischemic heart disease: Secondary | ICD-10-CM

## 2014-02-04 LAB — CBC
HCT: 29.1 % — ABNORMAL LOW (ref 39.0–52.0)
HCT: 27 % — ABNORMAL LOW (ref 39.0–52.0)
HCT: 27.5 % — ABNORMAL LOW (ref 39.0–52.0)
HEMATOCRIT: 28.1 % — AB (ref 39.0–52.0)
HEMATOCRIT: 25.2 % — AB (ref 39.0–52.0)
HEMOGLOBIN: 8.4 g/dL — AB (ref 13.0–17.0)
HEMOGLOBIN: 8.9 g/dL — AB (ref 13.0–17.0)
HEMOGLOBIN: 9.6 g/dL — AB (ref 13.0–17.0)
Hemoglobin: 9.4 g/dL — ABNORMAL LOW (ref 13.0–17.0)
Hemoglobin: 8.7 g/dL — ABNORMAL LOW (ref 13.0–17.0)
MCH: 27.8 pg (ref 26.0–34.0)
MCH: 27 pg (ref 26.0–34.0)
MCH: 27.2 pg (ref 26.0–34.0)
MCH: 28.2 pg (ref 26.0–34.0)
MCH: 28.4 pg (ref 26.0–34.0)
MCHC: 33 g/dL (ref 30.0–36.0)
MCHC: 32.2 g/dL (ref 30.0–36.0)
MCHC: 32.4 g/dL (ref 30.0–36.0)
MCHC: 33.5 g/dL (ref 30.0–36.0)
MCHC: 33.3 g/dL (ref 30.0–36.0)
MCV: 83.9 fL (ref 78.0–100.0)
MCV: 84.1 fL (ref 78.0–100.0)
MCV: 84.3 fL (ref 78.0–100.0)
MCV: 84.4 fL (ref 78.0–100.0)
MCV: 85.1 fL (ref 78.0–100.0)
PLATELETS: 168 10*3/uL (ref 150–400)
Platelets: 185 10*3/uL (ref 150–400)
Platelets: 208 10*3/uL (ref 150–400)
Platelets: 187 10*3/uL (ref 150–400)
Platelets: 171 10*3/uL (ref 150–400)
RBC: 2.96 MIL/uL — ABNORMAL LOW (ref 4.22–5.81)
RBC: 3.22 MIL/uL — AB (ref 4.22–5.81)
RBC: 3.27 MIL/uL — ABNORMAL LOW (ref 4.22–5.81)
RBC: 3.33 MIL/uL — ABNORMAL LOW (ref 4.22–5.81)
RBC: 3.45 MIL/uL — AB (ref 4.22–5.81)
RDW: 16.4 % — AB (ref 11.5–15.5)
RDW: 16.4 % — AB (ref 11.5–15.5)
RDW: 16.4 % — ABNORMAL HIGH (ref 11.5–15.5)
RDW: 16.6 % — ABNORMAL HIGH (ref 11.5–15.5)
RDW: 16.7 % — ABNORMAL HIGH (ref 11.5–15.5)
WBC: 6.7 10*3/uL (ref 4.0–10.5)
WBC: 7.5 10*3/uL (ref 4.0–10.5)
WBC: 7.9 10*3/uL (ref 4.0–10.5)
WBC: 8.2 10*3/uL (ref 4.0–10.5)
WBC: 8.2 10*3/uL (ref 4.0–10.5)

## 2014-02-04 LAB — URINALYSIS, ROUTINE W REFLEX MICROSCOPIC
BILIRUBIN URINE: NEGATIVE
Glucose, UA: NEGATIVE mg/dL
HGB URINE DIPSTICK: NEGATIVE
Ketones, ur: NEGATIVE mg/dL
Leukocytes, UA: NEGATIVE
NITRITE: NEGATIVE
PROTEIN: NEGATIVE mg/dL
SPECIFIC GRAVITY, URINE: 1.015 (ref 1.005–1.030)
UROBILINOGEN UA: 0.2 mg/dL (ref 0.0–1.0)
pH: 7 (ref 5.0–8.0)

## 2014-02-04 LAB — GLUCOSE, CAPILLARY
GLUCOSE-CAPILLARY: 141 mg/dL — AB (ref 70–99)
Glucose-Capillary: 107 mg/dL — ABNORMAL HIGH (ref 70–99)
Glucose-Capillary: 100 mg/dL — ABNORMAL HIGH (ref 70–99)

## 2014-02-04 LAB — COMPREHENSIVE METABOLIC PANEL
ALBUMIN: 2.8 g/dL — AB (ref 3.5–5.2)
ALK PHOS: 32 U/L — AB (ref 39–117)
ALT: 13 U/L (ref 0–53)
AST: 10 U/L (ref 0–37)
BUN: 45 mg/dL — ABNORMAL HIGH (ref 6–23)
CO2: 25 mEq/L (ref 19–32)
CREATININE: 2.24 mg/dL — AB (ref 0.50–1.35)
Calcium: 8.2 mg/dL — ABNORMAL LOW (ref 8.4–10.5)
Chloride: 105 mEq/L (ref 96–112)
GFR calc non Af Amer: 26 mL/min — ABNORMAL LOW (ref >90)
GFR, EST AFRICAN AMERICAN: 31 mL/min — AB (ref >90)
GLUCOSE: 100 mg/dL — AB (ref 70–99)
POTASSIUM: 5.1 meq/L (ref 3.7–5.3)
Sodium: 139 mEq/L (ref 137–147)
TOTAL PROTEIN: 5.4 g/dL — AB (ref 6.0–8.3)
Total Bilirubin: 0.4 mg/dL (ref 0.3–1.2)

## 2014-02-04 LAB — SODIUM, URINE, RANDOM: Sodium, Ur: 97 mEq/L

## 2014-02-04 LAB — TROPONIN I: Troponin I: <0.3 ng/mL (ref <0.30)

## 2014-02-04 LAB — CREATININE, URINE, RANDOM: Creatinine, Urine: 77.74 mg/dL

## 2014-02-04 MED ORDER — INSULIN ASPART 100 UNIT/ML ~~LOC~~ SOLN
0.0000 [IU] | Freq: Three times a day (TID) | SUBCUTANEOUS | Status: DC
  Administered 2014-02-05: 1 [IU] via SUBCUTANEOUS
  Administered 2014-02-05: 3 [IU] via SUBCUTANEOUS

## 2014-02-04 NOTE — Progress Notes (Signed)
[facility] TEAM 1 - Stepdown/ICU TEAM Progress Note  Wayne Gordon FVC:[id number] DOB: 14-Apr-1934 DOA: 02/03/2014 PCP: Octavio Graves, DO  Admit HPI / Brief Narrative: 80 yo WM PMHx CAD, ischemic cardiomyopathy, s/p CABG, MVR, ICD placement, DM, and HTN Admitted for painless hematochezia. Patient states he had painless hematochezia that started from 11 AM until 8 PM. No prior history of these symptoms in the past. The patient denies any issues with fever, proctalgia, or diarrhea. His HGB dropped from the 12 range down to the 8-9 range, but in the past, in 2013, his HGB was 10. He denies having a prior colonoscopy in the past and there is no known family history of colon cancer.   HPI/Subjective: 6/96 hemoglobin has been stable overnight. Dr. Carol Ada (GI) evaluated patient this morning recommended colonoscopy however patient declined procedure. Patient was going to be discharged today however has orthostatic hypotension with weakness when he stands.   Assessment/Plan:  Rectal bleeding  - most likely source could be diverticulosis. Dr. Carol Ada (GI) recommended colonoscopy however since patient's hemoglobin currently stable did not press the issue when patient declined. -Continue Protonix IV 40 mg BID  -Continue to monitor CBC  Orthostatic hypotension -Most likely secondary to patient's GI bleed, and diarrhea -Patient agreed to stay one more night to allow [place] to hydrate -Increase normal saline to 128m/hr, closely monitor for fluid overload secondary to patient's ischemic cardiomyopathy -Will hold patient's Coreg, Lasix, losartan, Spironolactone secondary to his orthostatic hypotension -If patient does not respond to fluid resuscitation would consider echocardiogram, and possible cardiac consult   Type and screen.   -Given the patient's cardiac history transfuse for hemoglobin <8   Acute renal failure with hyperkalemia -Most likely secondary to patient's dehydration should  correct with hydration -Hold all nephrotoxic medication    CAD status post CABG  - Denies any chest pain, however does feel weak when he stands. Most likely secondary dehydration -troponin x 1 negative, obtain serial troponins. -ProBNP abnormal; 2112  Hypertension -See orthostatic hypotension  ischemic cardiomyopathy -See orthostatic hypotension, GI bleed   Anemia  -Most likely acute secondary to blood loss - follow CBC. -The type and screen   Code Status: FULL Family Communication: no family present at time of exam Disposition Plan: Discharge in a.m. if orthostatic hypotension resolved    Consultants: -Dr. PCarol Ada(GI)    Procedure/Significant Events: 02/22/2012 echocardiogram - Left ventricle: mildly dilated. mild LVH.  -LVEF=  20% to 25%. Akinesis inferior and inferoseptal myocardium with severe diffuse hypokinesis.  -(grade 2 diastolic dysfunction). - Aortic valve: Mild regurgitation. Mean gradient: 323mHg (S). - Mitral valve: Evidence of annuloplasty repair. Mild regurgitation.  - Left atrium: mildly dilated. - Right ventricle: mildly dilated. - Tricuspid valve: Mild regurgitation. Peak RV-RA gradient:2539mg (S). 6/8 CXR; Rt midiaphragm is markedly  6/8 CT abdomen pelvis without contrast; Extensive diverticulosis without evidence of diverticulitis.  -Mild enlargement of prostate gland.  -Punctate lucent lesions pelvic bones bilaterally may reflect osteopenia;cannot be definitively characterized. Clinical correlation for multiple myeloma is recommended   Culture NA  Antibiotics: NA  DVT prophylaxis: SCD   Devices NA   LINES / TUBES:  6/8 22ga right antecubital    Continuous Infusions: . sodium chloride 75 mL/hr at 02/04/14 0600    Objective: VITAL SIGNS: Temp: 97.9 F (36.6 C) (06/09 1218) Temp src: Oral (06/09 1218) BP: 117/58 mmHg (06/09 1218) Pulse Rate: 71 (06/09 1218) SPO2; FIO2:   Intake/Output Summary (Last 24 hours) at  02/04/14 1625 Last data filed at 02/04/14 0800  Gross per 24 hour  Intake 1093.75 ml  Output    702 ml  Net 391.75 ml     Exam: General: A./O. x4, NAD, No acute respiratory distress Lungs: Clear to auscultation bilaterally without wheezes or crackles Cardiovascular: Regular rate and rhythm without murmur gallop or rub normal S1 and S2 Abdomen: Nontender, nondistended, soft, bowel sounds positive, no rebound, no ascites, no appreciable mass Extremities: No significant cyanosis, clubbing, or edema bilateral lower extremities  Data Reviewed: Basic Metabolic Panel:  Recent Labs Lab 02/03/14 1500 02/04/14 0808  NA 140 139  K 5.7* 5.1  CL 103 105  CO2 26 25  GLUCOSE 134* 100*  BUN 46* 45*  CREATININE 2.52* 2.24*  CALCIUM 8.8 8.2*   Liver Function Tests:  Recent Labs Lab 02/03/14 1500 02/04/14 0808  AST 17 10  ALT 21 13  ALKPHOS 42 32*  BILITOT 0.5 0.4  PROT 6.9 5.4*  ALBUMIN 3.4* 2.8*    Recent Labs Lab 02/03/14 1555  LIPASE 61*   No results found for this basename: AMMONIA,  in the last 168 hours CBC:  Recent Labs Lab 02/03/14 1500 02/03/14 2048 02/04/14 0035 02/04/14 0453 02/04/14 0808 02/04/14 1215  WBC 8.8 8.8 8.2 8.2 7.5 7.9  NEUTROABS 6.4  --   --   --   --   --   HGB 12.0* 9.9* 9.6* 9.4* 8.7* 8.9*  HCT 37.1* 30.4* 29.1* 28.1* 27.0* 27.5*  MCV 85.1 84.7 84.3 84.4 83.9 84.1  PLT 212 199 185 208 168 187   Cardiac Enzymes:  Recent Labs Lab 02/03/14 2039  TROPONINI <0.30   BNP (last 3 results)  Recent Labs  02/03/14 1555  PROBNP 2112.0*   CBG:  Recent Labs Lab 02/03/14 2045 02/03/14 2314 02/04/14 0315 02/04/14 0748  GLUCAP 173* 141* 107* 100*    Recent Results (from the past 240 hour(s))  MRSA PCR SCREENING     Status: None   Collection Time    02/03/14  9:38 PM      Result Value Ref Range Status   MRSA by PCR NEGATIVE  NEGATIVE Final   Comment:            The GeneXpert MRSA Assay (FDA     approved for NASAL specimens       only), is one component of a     comprehensive MRSA colonization     surveillance program. It is not     intended to diagnose MRSA     infection nor to guide or     monitor treatment for     MRSA infections.     Studies:  Recent x-ray studies have been reviewed in detail by the Attending Physician  Scheduled Meds:  Scheduled Meds: . insulin aspart  0-9 Units Subcutaneous TID WC  . pantoprazole (PROTONIX) IV  40 mg Intravenous Q12H    Time spent on care of this patient: 40 mins   Allie Bossier , MD   [facility] Office  [phone] Pager [phone]  On-Call/Text Page:      [email or web address]      password [email or web address]  If 7PM-7AM, please contact night-coverage [email or web address] Password [email or web address] 02/04/2014, 4:25 PM   LOS: 1 day

## 2014-02-04 NOTE — Progress Notes (Signed)
Utilization review completed.  

## 2014-02-04 NOTE — Consult Note (Signed)
Unassigned Consult  Reason for Consult: Hematochezia Referring Physician: [facility]  Masyn M Veith HPI: This is a 80 year old male who was admitted for painless hematochezia.  He has history of CAD, ischemic cardiomyopathy, s/p CABG, MVR, ICD placement, DM, and HTN.  The patient states he had painless hematochezia that started from 11 AM until 8 PM.  No prior history of these symptoms in the past.  The patient denies any issues with fever, proctalgia, or diarrhea.  His HGB dropped from the 12 range down to the 8-9 range, but in the past, in 2013, his HGB was 10.  He denies having a prior colonoscopy in the past and there is no known family history of colon cancer.  Past Medical History  Diagnosis Date  . Hypertension   . Hyperlipidemia   . Heart murmur   . Shortness of breath     "lying down & w/exertion"  . Diabetes mellitus   . GERD (gastroesophageal reflux disease)   . Arthritis   . Chronic systolic heart failure   . Mitral regurgitation     S/P mitral valve repair [[id number]][  . Ischemic cardiomyopathy     S/P CABG x 3; EF 25%  . Carotid artery disease     62-95% LICA  . Allergy to ACE inhibitors     Angioedema  . Renal insufficiency   . Coronary artery disease   . Myocardial infarction   . Dysrhythmia     heart block  . ICD (implantable cardiac defibrillator) in place 03/28/2012  . CHF (congestive heart failure)     Past Surgical History  Procedure Laterality Date  . Knee arthroscopy  ~ 2008    right  . Nasal sinus surgery  1990's    right  . Mitral valve repair  10/17/2011    Procedure: MITRAL VALVE REPAIR (MVR);  Surgeon: Rexene Alberts, MD;  Location: [place];  Service: Open Heart Surgery;  Laterality: N/A;  . Coronary artery bypass graft  10/17/2011    Procedure: CORONARY ARTERY BYPASS GRAFTING (CABG);  Surgeon: Rexene Alberts, MD;  Location: [place];  Service: Open Heart Surgery;  Laterality: N/A;  . Icd  03/28/2012    Family History  Problem Relation Age  of Onset  . Heart disease Mother     Social History:  reports that he has never smoked. He has never used smokeless tobacco. He reports that he does not drink alcohol or use illicit drugs.  Allergies:  Allergies  Allergen Reactions  . Ace Inhibitors   . Penicillins Palpitations    Medications:  Scheduled: . insulin aspart  0-9 Units Subcutaneous TID WC  . pantoprazole (PROTONIX) IV  40 mg Intravenous Q12H   Continuous: . sodium chloride 75 mL/hr at 02/04/14 0600    Results for orders placed during the hospital encounter of 02/03/14 (from the past 24 hour(s))  COMPREHENSIVE METABOLIC PANEL     Status: Abnormal   Collection Time    02/03/14  3:00 PM      Result Value Ref Range   Sodium 140  137 - 147 mEq/L   Potassium 5.7 (*) 3.7 - 5.3 mEq/L   Chloride 103  96 - 112 mEq/L   CO2 26  19 - 32 mEq/L   Glucose, Bld 134 (*) 70 - 99 mg/dL   BUN 46 (*) 6 - 23 mg/dL   Creatinine, Ser 2.52 (*) 0.50 - 1.35 mg/dL   Calcium 8.8  8.4 - 10.5 mg/dL  Total Protein 6.9  6.0 - 8.3 g/dL   Albumin 3.4 (*) 3.5 - 5.2 g/dL   AST 17  0 - 37 U/L   ALT 21  0 - 53 U/L   Alkaline Phosphatase 42  39 - 117 U/L   Total Bilirubin 0.5  0.3 - 1.2 mg/dL   GFR calc non Af Amer 23 (*) >90 mL/min   GFR calc Af Amer 27 (*) >90 mL/min  CBC WITH DIFFERENTIAL     Status: Abnormal   Collection Time    02/03/14  3:00 PM      Result Value Ref Range   WBC 8.8  4.0 - 10.5 K/uL   RBC 4.36  4.22 - 5.81 MIL/uL   Hemoglobin 12.0 (*) 13.0 - 17.0 g/dL   HCT 37.1 (*) 39.0 - 52.0 %   MCV 85.1  78.0 - 100.0 fL   MCH 27.5  26.0 - 34.0 pg   MCHC 32.3  30.0 - 36.0 g/dL   RDW 16.4 (*) 11.5 - 15.5 %   Platelets 212  150 - 400 K/uL   Neutrophils Relative % 73  43 - 77 %   Neutro Abs 6.4  1.7 - 7.7 K/uL   Lymphocytes Relative 16  12 - 46 %   Lymphs Abs 1.4  0.7 - 4.0 K/uL   Monocytes Relative 9  3 - 12 %   Monocytes Absolute 0.8  0.1 - 1.0 K/uL   Eosinophils Relative 2  0 - 5 %   Eosinophils Absolute 0.1  0.0 - 0.7  K/uL   Basophils Relative 0  0 - 1 %   Basophils Absolute 0.0  0.0 - 0.1 K/uL  LIPASE, BLOOD     Status: Abnormal   Collection Time    02/03/14  3:55 PM      Result Value Ref Range   Lipase 61 (*) 11 - 59 U/L  PRO B NATRIURETIC PEPTIDE     Status: Abnormal   Collection Time    02/03/14  3:55 PM      Result Value Ref Range   Pro B Natriuretic peptide (BNP) 2112.0 (*) 0 - 450 pg/mL  TYPE AND SCREEN     Status: None   Collection Time    02/03/14  3:55 PM      Result Value Ref Range   ABO/RH(D) A POS     Antibody Screen NEG     Sample Expiration 02/06/2014    URINALYSIS, ROUTINE W REFLEX MICROSCOPIC     Status: None   Collection Time    02/03/14  4:16 PM      Result Value Ref Range   Color, Urine YELLOW  YELLOW   APPearance CLEAR  CLEAR   Specific Gravity, Urine 1.015  1.005 - 1.030   pH 5.0  5.0 - 8.0   Glucose, UA NEGATIVE  NEGATIVE mg/dL   Hgb urine dipstick NEGATIVE  NEGATIVE   Bilirubin Urine NEGATIVE  NEGATIVE   Ketones, ur NEGATIVE  NEGATIVE mg/dL   Protein, ur NEGATIVE  NEGATIVE mg/dL   Urobilinogen, UA 0.2  0.0 - 1.0 mg/dL   Nitrite NEGATIVE  NEGATIVE   Leukocytes, UA NEGATIVE  NEGATIVE  TROPONIN I     Status: None   Collection Time    02/03/14  8:39 PM      Result Value Ref Range   Troponin I <0.30  <0.30 ng/mL  CBG MONITORING, ED     Status: Abnormal   Collection Time  02/03/14  8:45 PM      Result Value Ref Range   Glucose-Capillary 173 (*) 70 - 99 mg/dL  CBC     Status: Abnormal   Collection Time    02/03/14  8:48 PM      Result Value Ref Range   WBC 8.8  4.0 - 10.5 K/uL   RBC 3.59 (*) 4.22 - 5.81 MIL/uL   Hemoglobin 9.9 (*) 13.0 - 17.0 g/dL   HCT 30.4 (*) 39.0 - 52.0 %   MCV 84.7  78.0 - 100.0 fL   MCH 27.6  26.0 - 34.0 pg   MCHC 32.6  30.0 - 36.0 g/dL   RDW 16.3 (*) 11.5 - 15.5 %   Platelets 199  150 - 400 K/uL  MRSA PCR SCREENING     Status: None   Collection Time    02/03/14  9:38 PM      Result Value Ref Range   MRSA by PCR NEGATIVE   NEGATIVE  GLUCOSE, CAPILLARY     Status: Abnormal   Collection Time    02/03/14 11:14 PM      Result Value Ref Range   Glucose-Capillary 141 (*) 70 - 99 mg/dL   Comment 1 Documented in Chart     Comment 2 Notify RN    CBC     Status: Abnormal   Collection Time    02/04/14 12:35 AM      Result Value Ref Range   WBC 8.2  4.0 - 10.5 K/uL   RBC 3.45 (*) 4.22 - 5.81 MIL/uL   Hemoglobin 9.6 (*) 13.0 - 17.0 g/dL   HCT 29.1 (*) 39.0 - 52.0 %   MCV 84.3  78.0 - 100.0 fL   MCH 27.8  26.0 - 34.0 pg   MCHC 33.0  30.0 - 36.0 g/dL   RDW 16.4 (*) 11.5 - 15.5 %   Platelets 185  150 - 400 K/uL  GLUCOSE, CAPILLARY     Status: Abnormal   Collection Time    02/04/14  3:15 AM      Result Value Ref Range   Glucose-Capillary 107 (*) 70 - 99 mg/dL  CBC     Status: Abnormal   Collection Time    02/04/14  4:53 AM      Result Value Ref Range   WBC 8.2  4.0 - 10.5 K/uL   RBC 3.33 (*) 4.22 - 5.81 MIL/uL   Hemoglobin 9.4 (*) 13.0 - 17.0 g/dL   HCT 28.1 (*) 39.0 - 52.0 %   MCV 84.4  78.0 - 100.0 fL   MCH 28.2  26.0 - 34.0 pg   MCHC 33.5  30.0 - 36.0 g/dL   RDW 16.7 (*) 11.5 - 15.5 %   Platelets 208  150 - 400 K/uL  GLUCOSE, CAPILLARY     Status: Abnormal   Collection Time    02/04/14  7:48 AM      Result Value Ref Range   Glucose-Capillary 100 (*) 70 - 99 mg/dL   Comment 1 Documented in Chart     Comment 2 Notify RN    CBC     Status: Abnormal   Collection Time    02/04/14  8:08 AM      Result Value Ref Range   WBC 7.5  4.0 - 10.5 K/uL   RBC 3.22 (*) 4.22 - 5.81 MIL/uL   Hemoglobin 8.7 (*) 13.0 - 17.0 g/dL   HCT 27.0 (*) 39.0 - 52.0 %  MCV 83.9  78.0 - 100.0 fL   MCH 27.0  26.0 - 34.0 pg   MCHC 32.2  30.0 - 36.0 g/dL   RDW 16.4 (*) 11.5 - 15.5 %   Platelets 168  150 - 400 K/uL  COMPREHENSIVE METABOLIC PANEL     Status: Abnormal   Collection Time    02/04/14  8:08 AM      Result Value Ref Range   Sodium 139  137 - 147 mEq/L   Potassium 5.1  3.7 - 5.3 mEq/L   Chloride 105  96 - 112  mEq/L   CO2 25  19 - 32 mEq/L   Glucose, Bld 100 (*) 70 - 99 mg/dL   BUN 45 (*) 6 - 23 mg/dL   Creatinine, Ser 2.24 (*) 0.50 - 1.35 mg/dL   Calcium 8.2 (*) 8.4 - 10.5 mg/dL   Total Protein 5.4 (*) 6.0 - 8.3 g/dL   Albumin 2.8 (*) 3.5 - 5.2 g/dL   AST 10  0 - 37 U/L   ALT 13  0 - 53 U/L   Alkaline Phosphatase 32 (*) 39 - 117 U/L   Total Bilirubin 0.4  0.3 - 1.2 mg/dL   GFR calc non Af Amer 26 (*) >90 mL/min   GFR calc Af Amer 31 (*) >90 mL/min  URINALYSIS, ROUTINE W REFLEX MICROSCOPIC     Status: None   Collection Time    02/04/14  9:39 AM      Result Value Ref Range   Color, Urine YELLOW  YELLOW   APPearance CLEAR  CLEAR   Specific Gravity, Urine 1.015  1.005 - 1.030   pH 7.0  5.0 - 8.0   Glucose, UA NEGATIVE  NEGATIVE mg/dL   Hgb urine dipstick NEGATIVE  NEGATIVE   Bilirubin Urine NEGATIVE  NEGATIVE   Ketones, ur NEGATIVE  NEGATIVE mg/dL   Protein, ur NEGATIVE  NEGATIVE mg/dL   Urobilinogen, UA 0.2  0.0 - 1.0 mg/dL   Nitrite NEGATIVE  NEGATIVE   Leukocytes, UA NEGATIVE  NEGATIVE  CREATININE, URINE, RANDOM     Status: None   Collection Time    02/04/14  9:39 AM      Result Value Ref Range   Creatinine, Urine 77.74    SODIUM, URINE, RANDOM     Status: None   Collection Time    02/04/14  9:39 AM      Result Value Ref Range   Sodium, Ur 97    CBC     Status: Abnormal   Collection Time    02/04/14 12:15 PM      Result Value Ref Range   WBC 7.9  4.0 - 10.5 K/uL   RBC 3.27 (*) 4.22 - 5.81 MIL/uL   Hemoglobin 8.9 (*) 13.0 - 17.0 g/dL   HCT 27.5 (*) 39.0 - 52.0 %   MCV 84.1  78.0 - 100.0 fL   MCH 27.2  26.0 - 34.0 pg   MCHC 32.4  30.0 - 36.0 g/dL   RDW 16.4 (*) 11.5 - 15.5 %   Platelets 187  150 - 400 K/uL     Ct Abdomen Pelvis Wo Contrast  02/03/2014   CLINICAL DATA:  Bright red blood in stool.  EXAM: CT ABDOMEN AND PELVIS WITHOUT CONTRAST  TECHNIQUE: Multidetector CT imaging of the abdomen and pelvis was performed following the standard protocol without IV contrast.   COMPARISON:  None.  FINDINGS: There is some mild atelectasis in the right lung base. Lung bases  are otherwise clear.  The liver, gallbladder, adrenal glands, spleen, pancreas and kidneys are unremarkable. The patient has extensive colonic diverticulosis, worst in the sigmoid and descending colon. No evidence of diverticulitis is identified. No mass is seen. The prostate gland is mildly prominent. Urinary bladder appears unremarkable. Extensive aortoiliac atherosclerosis without aneurysm is noted. Tiny lucent lesions are seen in the pelvic bones appear to be fat attenuating and may reflect osteopenia.  IMPRESSION: No acute finding  Extensive diverticulosis without evidence of diverticulitis.  Mild enlargement of prostate gland.  Punctate lucent lesions in the pelvic bones bilaterally may reflect osteopenia but cannot be definitively characterized. Clinical correlation for multiple myeloma is recommended.   Electronically Signed   By: Inge Rise M.D.   On: 02/03/2014 18:34   Dg Chest 2 View  02/03/2014   CLINICAL DATA:  Rectal bleeding with diarrhea.  EXAM: CHEST  2 VIEW  COMPARISON:  07/16/2012.  FINDINGS: Trachea is midline. Heart size stable. Left subclavian ICD lead tip projects over the right ventricle. Right hemidiaphragm is markedly elevated, as before. Scarring at the right lung base. Lungs are otherwise clear. No pleural fluid. Flowing anterior osteophytosis in the thoracic spine.  IMPRESSION: No acute findings.   Electronically Signed   By: Lorin Picket M.D.   On: 02/03/2014 17:13    ROS:  As stated above in the HPI otherwise negative.  Blood pressure 117/58, pulse 71, temperature 97.9 F (36.6 C), temperature source Oral, resp. rate 15, height $RemoveBe'6\' 5"'JAzLCayBi$  (1.956 m), weight 240 lb 8.4 oz (109.1 kg), SpO2 99.00%.    PE: Gen: NAD, Alert and Oriented HEENT:  [place]/AT, EOMI Neck: Supple, no LAD Lungs: CTA Bilaterally CV: RRR without M/G/R ABM: Soft, NTND, +BS Ext: No  C/C/E  Assessment/Plan: 1) Diverticular bleed. 2) Anemia. 3) Ischemic cardiomyopathy.   I recommended a colonoscopy for him as he never had a prior procedure in the setting of bleeding.  He declines at this time.  He states that if his bleeding recurs he will think about having a colonoscopy in the future.    Plan: 1) Follow HGB. 2) Transfuse if necessary. 3) No further GI intervention.  Signing off.  Beryle Beams 02/04/2014, 1:27 PM

## 2014-02-05 LAB — TROPONIN I: Troponin I: <0.3 ng/mL (ref <0.30)

## 2014-02-05 LAB — CBC
HCT: 24.7 % — ABNORMAL LOW (ref 39.0–52.0)
HEMATOCRIT: 25.4 % — AB (ref 39.0–52.0)
HEMOGLOBIN: 8.2 g/dL — AB (ref 13.0–17.0)
HEMOGLOBIN: 8.4 g/dL — AB (ref 13.0–17.0)
MCH: 28.4 pg (ref 26.0–34.0)
MCH: 27.8 pg (ref 26.0–34.0)
MCHC: 33.2 g/dL (ref 30.0–36.0)
MCHC: 33.1 g/dL (ref 30.0–36.0)
MCV: 85.5 fL (ref 78.0–100.0)
MCV: 84.1 fL (ref 78.0–100.0)
Platelets: 158 10*3/uL (ref 150–400)
Platelets: 188 10*3/uL (ref 150–400)
RBC: 2.89 MIL/uL — AB (ref 4.22–5.81)
RBC: 3.02 MIL/uL — ABNORMAL LOW (ref 4.22–5.81)
RDW: 16.7 % — ABNORMAL HIGH (ref 11.5–15.5)
RDW: 16.5 % — AB (ref 11.5–15.5)
WBC: 5.6 10*3/uL (ref 4.0–10.5)
WBC: 7.3 10*3/uL (ref 4.0–10.5)

## 2014-02-05 LAB — GLUCOSE, CAPILLARY
GLUCOSE-CAPILLARY: 105 mg/dL — AB (ref 70–99)
Glucose-Capillary: 92 mg/dL (ref 70–99)
Glucose-Capillary: 190 mg/dL — ABNORMAL HIGH (ref 70–99)
Glucose-Capillary: 146 mg/dL — ABNORMAL HIGH (ref 70–99)
Glucose-Capillary: 217 mg/dL — ABNORMAL HIGH (ref 70–99)

## 2014-02-05 MED ORDER — LOSARTAN POTASSIUM 50 MG PO TABS: 25.0000 mg | ORAL_TABLET | Freq: Every day | ORAL | Status: DC

## 2014-02-05 MED ORDER — FUROSEMIDE 40 MG PO TABS: 40.0000 mg | ORAL_TABLET | Freq: Every day | ORAL | Status: DC

## 2014-02-05 MED ORDER — SODIUM CHLORIDE 0.9 % IV SOLN
INTRAVENOUS | Status: AC
  Administered 2014-02-05: 08:00:00 via INTRAVENOUS

## 2014-02-05 MED ORDER — PANTOPRAZOLE SODIUM 40 MG PO TBEC
40.0000 mg | DELAYED_RELEASE_TABLET | Freq: Two times a day (BID) | ORAL | Status: DC
  Administered 2014-02-05: 40 mg via ORAL
  Filled 2014-02-05: qty 1

## 2014-02-05 MED ORDER — CARVEDILOL 25 MG PO TABS
12.5000 mg | ORAL_TABLET | Freq: Two times a day (BID) | ORAL | Status: DC
Start: 2014-02-06 — End: 2014-05-30

## 2014-02-05 MED ORDER — SPIRONOLACTONE 25 MG PO TABS: 25.0000 mg | ORAL_TABLET | Freq: Every day | ORAL | Status: DC

## 2014-02-05 NOTE — Progress Notes (Signed)
Wayne Gordon to be D/C'd Home per MD order.  Discussed with the patient and all questions fully answered.    Medication List         aspirin EC 81 MG tablet  Take 1 tablet (81 mg total) by mouth daily.     CALCIUM-MAGNESIUM-ZINC PO  Take 2 tablets by mouth daily.     carvedilol 25 MG tablet  Commonly known as:  COREG  Take 0.5 tablets (12.5 mg total) by mouth 2 (two) times daily with a meal.  Start taking on:  02/06/2014     Fish Oil 1000 MG Caps  Take 1 capsule by mouth 2 (two) times daily.     furosemide 40 MG tablet  Commonly known as:  LASIX  Take 1 tablet (40 mg total) by mouth daily. Takes extra 1/2 when needed  Start taking on:  02/06/2014     glipiZIDE 10 MG 24 hr tablet  Commonly known as:  GLUCOTROL XL  Take 10 mg by mouth daily with breakfast.     losartan 50 MG tablet  Commonly known as:  COZAAR  Take 0.5 tablets (25 mg total) by mouth daily.     multivitamins ther. w/minerals Tabs tablet  Take 1 tablet by mouth daily.     omeprazole 20 MG capsule  Commonly known as:  PRILOSEC  Take 20 mg by mouth daily.     rosuvastatin 20 MG tablet  Commonly known as:  CRESTOR  Take 20 mg by mouth every evening.     spironolactone 25 MG tablet  Commonly known as:  ALDACTONE  Take 1 tablet (25 mg total) by mouth daily.     testosterone cypionate 200 MG/ML injection  Commonly known as:  DEPOTESTOTERONE CYPIONATE  Inject 200 mg into the muscle every 14 (fourteen) days.        VVS, Skin clean, dry and intact without evidence of skin break down, no evidence of skin tears noted. IV catheter discontinued intact. Site without signs and symptoms of complications. Dressing and pressure applied.  An After Visit Summary was printed and given to the patient.  D/c education completed with patient/family including follow up instructions, medication list, d/c activities limitations if indicated, with other d/c instructions as indicated by MD - patient able to verbalize  understanding, all questions fully answered.   Patient instructed to return to ED, call 911, or call MD for any changes in condition.   Patient escorted via WC, and D/C home via private auto.  Kennyth Arnold D 02/05/2014 5:24 PM

## 2014-02-05 NOTE — Discharge Summary (Signed)
DISCHARGE SUMMARY  Wayne Gordon  MR#: [id number]  DOB:1933-09-22  Date of Admission: 02/03/2014 Date of Discharge: 02/05/2014  Attending Physician:Rozetta Stumpp T  Patient's QAS:TMHDQQ, [name], DO  Consults: GI - Dr. Elnoria Howard  Disposition: D/C home   Follow-up Appts:     Follow-up Information   Follow up with [name], [name], DO. Schedule an appointment as soon as possible for a visit in 2 days.   Contact information:   [address] [place] [address] [phone]      Tests Needing Follow-up: -CBC is suggested in 2 days to recheck Hgb -BMET is suggested in 2 days to recheck GFR/crt to determine if it is safe to resume his ARB and aldactone  Discharge Diagnoses: Rectal bleeding - BRBPR - likely diverticular  Acute blood loss anemia  Orthostatic hypotension  Acute renal failure with hyperkalemia  CAD status post CABG  Hypertension  Ischemic cardiomyopathy   Initial presentation: 80 yo M Hx CAD, ischemic cardiomyopathy, s/p CABG, MVR, ICD placement, DM, and HTN admitted for painless hematochezia. Patient stated he had painless hematochezia that lasted from 11 AM until 8 PM. No prior history of this. The patient denied any issues with fever, proctalgia, or diarrhea. His HGB dropped from the 12 range down to the 8-9 range, but in the past, in 2013, his HGB was 10. He denied having a prior colonoscopy and there is no known family history of colon cancer.  Hospital Course:  Rectal bleeding  -most likely source is diverticulosis - GI recommended colonoscopy however patient declined - bleeding has resolved  Acute blood loss anemia  -due to GIB - Hgb appears to be holding steady at ~8.4 - suggest recheck of CBC in 2 days   Orthostatic hypotension  -Most likely secondary to patient's GI bleed -resolved after holding BP meds and hydrating  -advised pt we will resume cardiac meds in a stepwise fashion, and to contact his PCP should he develop recurrent sx or  hypotension  Acute renal failure with hyperkalemia  -Most likely secondary to patient's dehydration + ARB use   -Hold all nephrotoxic medication for now - suggest recheck BMET in 2 days to determine if ARB and aldactone can be resumed at that time   CAD status post CABG  -Denied any chest pain  -troponin negative x4  Hypertension  -See orthostatic hypotension   ischemic cardiomyopathy  -well compensated/volume depleted - resume meds in stepwise fashion - must hold adalctone and ARB for now due to renal failure     Medication List         aspirin EC 81 MG tablet  Take 1 tablet (81 mg total) by mouth daily.     CALCIUM-MAGNESIUM-ZINC PO  Take 2 tablets by mouth daily.     carvedilol 25 MG tablet  Commonly known as:  COREG  Take 0.5 tablets (12.5 mg total) by mouth 2 (two) times daily with a meal.  Start taking on:  02/06/2014     Fish Oil 1000 MG Caps  Take 1 capsule by mouth 2 (two) times daily.     furosemide 40 MG tablet  Commonly known as:  LASIX  Take 1 tablet (40 mg total) by mouth daily. Takes extra 1/2 when needed  Start taking on:  02/06/2014     glipiZIDE 10 MG 24 hr tablet  Commonly known as:  GLUCOTROL XL  Take 10 mg by mouth daily with breakfast.     losartan 50 MG tablet  Commonly known as:  COZAAR  Take 0.5 tablets (25 mg total) by mouth daily. DO NOT TAKE UNTIL OTHERWISE INSTRUCTED BY YOUR PCP     multivitamins ther. w/minerals Tabs tablet  Take 1 tablet by mouth daily.     omeprazole 20 MG capsule  Commonly known as:  PRILOSEC  Take 20 mg by mouth daily.     rosuvastatin 20 MG tablet  Commonly known as:  CRESTOR  Take 20 mg by mouth every evening.     spironolactone 25 MG tablet  Commonly known as:  ALDACTONE  Take 1 tablet (25 mg total) by mouth daily. DO NOT TAKE UNTIL OTHERWISE INSTRUCTED BY YOUR PCP     testosterone cypionate 200 MG/ML injection  Commonly known as:  DEPOTESTOTERONE CYPIONATE  Inject 200 mg into the muscle every 14  (fourteen) days.       Day of Discharge BP 127/64  Pulse 101  Temp(Src) 97.6 F (36.4 C) (Oral)  Resp 21  Ht 6\' 5"  (1.956 m)  Wt 108.6 kg (239 lb 6.7 oz)  BMI 28.39 kg/m2  SpO2 99%  Physical Exam: General: No acute respiratory distress Lungs: Clear to auscultation bilaterally without wheezes or crackles Cardiovascular: Regular rate and rhythm without murmur gallop or rub normal S1 and S2 Abdomen: Nontender, nondistended, soft, bowel sounds positive, no rebound, no ascites, no appreciable mass Extremities: No significant cyanosis, clubbing, or edema bilateral lower extremities  Results for orders placed during the hospital encounter of 02/03/14 (from the past 24 hour(s))  TROPONIN I     Status: None   Collection Time    02/04/14  6:17 PM      Result Value Ref Range   Troponin I <0.30  <0.30 ng/mL  GLUCOSE, CAPILLARY     Status: Abnormal   Collection Time    02/04/14  9:12 PM      Result Value Ref Range   Glucose-Capillary 190 (*) 70 - 99 mg/dL  CBC     Status: Abnormal   Collection Time    02/04/14 10:39 PM      Result Value Ref Range   WBC 6.7  4.0 - 10.5 K/uL   RBC 2.96 (*) 4.22 - 5.81 MIL/uL   Hemoglobin 8.4 (*) 13.0 - 17.0 g/dL   HCT 16.125.2 (*) 09.639.0 - 04.552.0 %   MCV 85.1  78.0 - 100.0 fL   MCH 28.4  26.0 - 34.0 pg   MCHC 33.3  30.0 - 36.0 g/dL   RDW 40.916.6 (*) 81.111.5 - 91.415.5 %   Platelets 171  150 - 400 K/uL  TROPONIN I     Status: None   Collection Time    02/04/14 10:39 PM      Result Value Ref Range   Troponin I <0.30  <0.30 ng/mL  TROPONIN I     Status: None   Collection Time    02/05/14  5:11 AM      Result Value Ref Range   Troponin I <0.30  <0.30 ng/mL  CBC     Status: Abnormal   Collection Time    02/05/14  5:11 AM      Result Value Ref Range   WBC 5.6  4.0 - 10.5 K/uL   RBC 2.89 (*) 4.22 - 5.81 MIL/uL   Hemoglobin 8.2 (*) 13.0 - 17.0 g/dL   HCT 78.224.7 (*) 95.639.0 - 21.352.0 %   MCV 85.5  78.0 - 100.0 fL   MCH 28.4  26.0 - 34.0 pg   MCHC 33.2  30.0 -  36.0  g/dL   RDW 81.1 (*) 91.4 - 78.2 %   Platelets 158  150 - 400 K/uL  GLUCOSE, CAPILLARY     Status: Abnormal   Collection Time    02/05/14  7:51 AM      Result Value Ref Range   Glucose-Capillary 146 (*) 70 - 99 mg/dL  GLUCOSE, CAPILLARY     Status: Abnormal   Collection Time    02/05/14 11:24 AM      Result Value Ref Range   Glucose-Capillary 217 (*) 70 - 99 mg/dL  CBC     Status: Abnormal   Collection Time    02/05/14  2:05 PM      Result Value Ref Range   WBC 7.3  4.0 - 10.5 K/uL   RBC 3.02 (*) 4.22 - 5.81 MIL/uL   Hemoglobin 8.4 (*) 13.0 - 17.0 g/dL   HCT 95.6 (*) 21.3 - 08.6 %   MCV 84.1  78.0 - 100.0 fL   MCH 27.8  26.0 - 34.0 pg   MCHC 33.1  30.0 - 36.0 g/dL   RDW 57.8 (*) 46.9 - 62.9 %   Platelets 188  150 - 400 K/uL    Time spent in discharge (includes decision making & examination of pt): >30 minutes  02/05/2014, 4:16 PM   Lonia Blood, MD [facility] Office  [phone] Pager [phone]  On-Call/Text Page:      [email or web address]      password [facility]

## 2014-02-05 NOTE — Discharge Instructions (Signed)
Gastrointestinal Bleeding Gastrointestinal bleeding is bleeding somewhere along the path that food travels through the body (digestive tract). This path is anywhere between the mouth and the opening of the butt (anus). You may have blood in your throw up (vomit) or in your poop (stools). If there is a lot of bleeding, you may need to stay in the hospital. HOME CARE  Only take medicine as told by your doctor.  Eat foods with fiber such as whole grains, fruits, and vegetables. You can also try eating 1 to 3 prunes a day.  Drink enough fluids to keep your pee (urine) clear or pale yellow. GET HELP RIGHT AWAY IF:   Your bleeding gets worse.  You feel dizzy, weak, or you pass out (faint).  You have bad cramps in your back or belly (abdomen).  You have large blood clumps (clots) in your poop.  Your problems are getting worse. MAKE SURE YOU:   Understand these instructions.  Will watch your condition.  Will get help right away if you are not doing well or get worse. Document Released: 05/24/2008 Document Revised: 08/01/2012 Document Reviewed: 07/25/2011 [facility] Patient Information 2014 [place], [place].  Diverticulosis Diverticulosis is a common condition that develops when small pouches (diverticula) form in the wall of the colon. The risk of diverticulosis increases with age. It happens more often in people who eat a low-fiber diet. Most individuals with diverticulosis have no symptoms. Those individuals with symptoms usually experience abdominal pain, constipation, or loose stools (diarrhea). HOME CARE INSTRUCTIONS   Increase the amount of fiber in your diet as directed by your caregiver or dietician. This may reduce symptoms of diverticulosis.  Your caregiver may recommend taking a dietary fiber supplement.  Drink at least 6 to 8 glasses of water each day to prevent constipation.  Try not to strain when you have a bowel movement.  Your caregiver may recommend avoiding nuts and  seeds to prevent complications, although this is still an uncertain benefit.  Only take over-the-counter or prescription medicines for pain, discomfort, or fever as directed by your caregiver. FOODS WITH HIGH FIBER CONTENT INCLUDE:  Fruits. Apple, peach, pear, tangerine, raisins, prunes.  Vegetables. Brussels sprouts, asparagus, broccoli, cabbage, carrot, cauliflower, romaine lettuce, spinach, summer squash, tomato, winter squash, zucchini.  Starchy Vegetables. Baked beans, kidney beans, lima beans, split peas, lentils, potatoes (with skin).  Grains. Whole wheat bread, brown rice, bran flake cereal, plain oatmeal, white rice, shredded wheat, bran muffins. SEEK IMMEDIATE MEDICAL CARE IF:   You develop increasing pain or severe bloating.  You have an oral temperature above 102 F (38.9 C), not controlled by medicine.  You develop vomiting or bowel movements that are bloody or black. Document Released: 05/12/2004 Document Revised: 11/07/2011 Document Reviewed: 01/13/2010 [facility] Patient Information 2014 [place], [place].

## 2014-02-10 ENCOUNTER — Telehealth: Payer: Self-pay | Admitting: Adult Health

## 2014-02-10 NOTE — Telephone Encounter (Signed)
I spoke with Dr.Butlers office,they said PA-C is there today and would assist patient.They also said they tried to call patient on Friday 6/12 to give lab results and could not reach patient.I encouraged pt to call Dr.Butlers office,he indicated he would

## 2014-02-10 NOTE — Telephone Encounter (Signed)
Patient has concerns regarding his hemoglobin and his PCP is out of town. / tgs

## 2014-02-24 ENCOUNTER — Encounter: Payer: Self-pay | Admitting: *Deleted

## 2014-03-24 ENCOUNTER — Encounter: Payer: Self-pay | Admitting: Physician Assistant

## 2014-03-24 ENCOUNTER — Ambulatory Visit (INDEPENDENT_AMBULATORY_CARE_PROVIDER_SITE_OTHER): Payer: Medicare HMO | Admitting: Physician Assistant

## 2014-03-24 VITALS — BP 132/62 | HR 58 | Ht 77.0 in | Wt 246.0 lb

## 2014-03-24 DIAGNOSIS — I214 Non-ST elevation (NSTEMI) myocardial infarction: Secondary | ICD-10-CM

## 2014-03-24 DIAGNOSIS — N289 Disorder of kidney and ureter, unspecified: Secondary | ICD-10-CM

## 2014-03-24 NOTE — Assessment & Plan Note (Signed)
Patient's EF is 20-25% on echo in 2013. He is currently off his spirometry on and Cozaar because of renal insufficiency in the hospital in the setting of a GI bleed. The patient's most recent creatinine was 1.91. We will recheck it today. If it is down I will start his Aldactone back. Patient's ankles are beginning to swell. His weight is up 6 pounds on our charts but seemed to be stable on his scales. Refer to renal.

## 2014-03-24 NOTE — Progress Notes (Addendum)
HPI: This is a 80 year old male patient of Dr. Sharrell Ku has history of ischemic cardiomyopathy status post CABG and mitral valve repair on 10/17/11, EF 20-25% on echo in 2013. He also has chronic well compensated systolic heart failure, mild renal insufficiency, stage II diabetes mellitus, and hypertension. He is status post ICD implantation.  The patient was recently hospitalized for rectal bleeding felt secondary to diverticulosis. He had acute blood loss anemia as well as orthostatic hypotension and acute renal failure with hyperkalemia during his hospitalization. He was not seen by [place]. His ARB and Aldactone were held and until bmet was rechecked.  After the patient was discharged he did require a blood transfusion. He says his hemoglobin is up to 9.1 and he feels better.He believes his creatinine is about 1.91 and he is trying to get an appointment with the renal doctor in [place]. He had some dyspnea on exertion and palpitations when he first left the hospital and his ankles have begun to swell. He also had some dizziness when he first started up but this resolved about a week ago. He is still not taking Aldactone or Cozaar. He says his breathing is better and he denies any chest pain, palpitations, dizziness or presyncope, or ICD shocks. He said the transfusion helped. He is scheduled to see the GI doctor tomorrow.   Allergies-- Ace Inhibitors   -- Penicillins -- Palpitations  Current Outpatient Prescriptions on File Prior to Visit: aspirin EC 81 MG tablet, Take 1 tablet (81 mg total) by mouth daily., Disp: , Rfl:  CALCIUM-MAGNESIUM-ZINC PO, Take 2 tablets by mouth daily., Disp: , Rfl:  carvedilol (COREG) 25 MG tablet, Take 0.5 tablets (12.5 mg total) by mouth 2 (two) times daily with a meal., Disp: 60 tablet, Rfl: 6 furosemide (LASIX) 40 MG tablet, Take 1 tablet (40 mg total) by mouth daily. Takes extra 1/2 when needed, Disp: 45 tablet, Rfl: 11 glipiZIDE (GLUCOTROL XL) 10 MG 24 hr tablet,  Take 10 mg by mouth daily with breakfast., Disp: , Rfl:  losartan (COZAAR) 50 MG tablet, Take 0.5 tablets (25 mg total) by mouth daily., Disp: 30 tablet, Rfl: 6 Multiple Vitamins-Minerals (MULTIVITAMINS THER. W/MINERALS) TABS, Take 1 tablet by mouth daily., Disp: , Rfl:  Omega-3 Fatty Acids (FISH OIL) 1000 MG CAPS, Take 1 capsule by mouth 2 (two) times daily., Disp: , Rfl:  omeprazole (PRILOSEC) 20 MG capsule, Take 20 mg by mouth daily., Disp: , Rfl:  rosuvastatin (CRESTOR) 20 MG tablet, Take 20 mg by mouth every evening., Disp: , Rfl:  spironolactone (ALDACTONE) 25 MG tablet, Take 1 tablet (25 mg total) by mouth daily., Disp: , Rfl:  testosterone cypionate (DEPOTESTOTERONE CYPIONATE) 200 MG/ML injection, Inject 200 mg into the muscle every 14 (fourteen) days., Disp: , Rfl:   No current facility-administered medications on file prior to visit.   Past Medical History:   Hypertension                                                 Hyperlipidemia                                               Heart murmur  Shortness of breath                                            Comment:"lying down & w/exertion"   Diabetes mellitus                                            GERD (gastroesophageal reflux disease)                       Arthritis                                                    Chronic systolic heart failure                               Mitral regurgitation                                           Comment:S/P mitral valve repair [[id number]][   Ischemic cardiomyopathy                                        Comment:S/P CABG x 3; EF 25%   Carotid artery disease                                         Comment:60-79% LICA   Allergy to ACE inhibitors                                      Comment:Angioedema   Renal insufficiency                                          Coronary artery disease                                      Myocardial  infarction                                        Dysrhythmia                                                    Comment:heart block   ICD (implantable cardiac defibrillator) in pla* 03/28/2012    CHF (congestive heart failure)  Past Surgical History:   KNEE ARTHROSCOPY                                 ~ 2008         Comment:right   NASAL SINUS SURGERY                              1990's         Comment:right   MITRAL VALVE REPAIR                              10/17/2011      Comment:Procedure: MITRAL VALVE REPAIR (MVR);  Surgeon:              Purcell Nails, MD;  Location: [facility] OR;                Service: Open Heart Surgery;  Laterality: N/A;   CORONARY ARTERY BYPASS GRAFT                     10/17/2011      Comment:Procedure: CORONARY ARTERY BYPASS GRAFTING               (CABG);  Surgeon: Purcell Nails, MD;                Location: [facility] OR;  Service: Open Heart Surgery;               Laterality: N/A;   ICD                                              03/28/2012   Review of patient's family history indicates:   Heart disease                  Mother                   Social History   Marital Status: Married             Spouse Name:                      Years of Education:                 Number of children:             Occupational History Occupation          Associate Professor            Comment              Retired                                   Social History Main Topics   Smoking Status: Never Smoker                     Smokeless Status: Never Used                       Alcohol Use: No             Drug Use:  No             Sexual Activity: Yes                Other Topics            Concern   None on file  Social History Narrative   None on file    ROS:   PHYSICAL EXAM: Well-nournished, in no acute distress. Neck: No JVD, HJR, Bruit, or thyroid enlargement  Lungs: No tachypnea, clear without wheezing, rales, or rhonchi  Cardiovascular: RRR, PMI  not displaced, heart sounds normal, positive S4, no murmur bruit, thrill, or heave.  Abdomen: BS normal. Soft without organomegaly, masses, lesions or tenderness.  Extremities: +1 ankle edema bilaterally without cyanosis, clubbing. Good distal pulses bilateral  SKin: Warm, no lesions or rashes   Musculoskeletal: No deformities  Neuro: no focal signs  Ht 6\' 5"  (1.956 m)  Wt 246 lb (111.585 kg)  BMI 29.17 kg/m2    EKG: Normal sinus rhythm with PVCs, bigeminy intraventricular conduction delay and nonspecific ST-T wave changes  2-D echo 01/2012 Study Conclusions  - Left ventricle: The cavity size was mildly dilated. Wall   thickness was increased in a pattern of mild LVH. Systolic   function was severely reduced. The estimated ejection   fraction was in the range of 20% to 25%. There is akinesis   of the inferior and inferoseptal myocardium with otherwise   severe diffuse hypokinesis. Features are consistent with a   pseudonormal left ventricular filling pattern, with   concomitant abnormal relaxation and increased filling   pressure (grade 2 diastolic dysfunction). Doppler   parameters are consistent with elevated mean left atrial   filling pressure. - Aortic valve: Moderately calcified annulus extends to LVOT   on some views. Mildly calcified leaflets. There was no   stenosis. Mild regurgitation. Mean gradient: 3mm Hg (S). - Mitral valve: Mildly thickened leaflets. Evidence of   annuloplasty repair. Mild regurgitation. Mean gradient:   3mm Hg (D). - Left atrium: The atrium was mildly dilated. - Right ventricle: The cavity size was mildly dilated.   Systolic function was mildly to moderately reduced. - Tricuspid valve: Mild regurgitation. Peak RV-RA gradient:   25mm Hg (S). - Pericardium, extracardiac: There was no pericardial   effusion.

## 2014-03-24 NOTE — Assessment & Plan Note (Signed)
Patient's creatinine up to 2.4 in the hospital. Most recent was 1.91 on 03/10/14. We will recheck today.

## 2014-03-24 NOTE — Assessment & Plan Note (Signed)
Blood pressure stable. He was orthostatic in the hospital in the setting of a GI bleed. He is no longer having these symptoms. Restart Aldactone if renal function stable. Hold off on Cozaar for now.

## 2014-03-24 NOTE — Assessment & Plan Note (Signed)
Patient's EF is 20-25% on echo in 2013. He is currently off his spirometry on and Cozaar because of renal insufficiency in the hospital in the setting of a GI bleed. The patient's most recent creatinine was 1.91. We will recheck it today. If it is down I will start his Aldactone back. Patient's ankles are beginning to swell. His weight is up 6 pounds on our charts but seemed to be stable on his scales. Refer to renal. 

## 2014-03-24 NOTE — Assessment & Plan Note (Signed)
Patient has frequent PVCs and bigeminy. This is followed closely by Dr. Ladona Ridgel. Followup with Dr. Ladona Ridgel in 2 months.

## 2014-03-24 NOTE — Patient Instructions (Signed)
Your physician recommends that you schedule a follow-up appointment in: 2 months with Dr Ladona Ridgelaylor   Your physician recommends that you return for lab work today. BMET  You have been referred to [facility].     Low-Sodium Eating Plan Sodium raises blood pressure and causes water to be held in the body. Getting less sodium from food will help lower your blood pressure, reduce any swelling, and protect your heart, liver, and kidneys. We get sodium by adding salt (sodium chloride) to food. Most of our sodium comes from canned, boxed, and frozen foods. Restaurant foods, fast foods, and pizza are also very high in sodium. Even if you take medicine to lower your blood pressure or to reduce fluid in your body, getting less sodium from your food is important. WHAT IS MY PLAN? Most people should limit their sodium intake to 2,300 mg a day. Your health care provider recommends that you limit your sodium intake to __________ a day.  WHAT DO I NEED TO KNOW ABOUT THIS EATING PLAN? For the low-sodium eating plan, you will follow these general guidelines:  Choose foods with a % Daily Value for sodium of less than 5% (as listed on the food label).   Use salt-free seasonings or herbs instead of table salt or sea salt.   Check with your health care provider or pharmacist before using salt substitutes.   Eat fresh foods.  Eat more vegetables and fruits.  Limit canned vegetables. If you do use them, rinse them well to decrease the sodium.   Limit cheese to 1 oz (28 g) per day.   Eat lower-sodium products, often labeled as "lower sodium" or "no salt added."  Avoid foods that contain monosodium glutamate (MSG). MSG is sometimes added to [place] food and some canned foods.  Check food labels (Nutrition Facts labels) on foods to learn how much sodium is in one serving.  Eat more home-cooked food and less restaurant, buffet, and fast food.  When eating at a restaurant, ask that your  food be prepared with less salt or none, if possible.  HOW DO I READ FOOD LABELS FOR SODIUM INFORMATION? The Nutrition Facts label lists the amount of sodium in one serving of the food. If you eat more than one serving, you must multiply the listed amount of sodium by the number of servings. Food labels may also identify foods as:  Sodium free--Less than 5 mg in a serving.  Very low sodium--35 mg or less in a serving.  Low sodium--140 mg or less in a serving.  Light in sodium--50% less sodium in a serving. For example, if a food that usually has 300 mg of sodium is changed to become light in sodium, it will have 150 mg of sodium.  Reduced sodium--25% less sodium in a serving. For example, if a food that usually has 400 mg of sodium is changed to reduced sodium, it will have 300 mg of sodium. WHAT FOODS CAN I EAT? Grains Low-sodium cereals, including oats, puffed wheat and rice, and shredded wheat cereals. Low-sodium crackers. Unsalted rice and pasta. Lower-sodium bread.  Vegetables Frozen or fresh vegetables. Low-sodium or reduced-sodium canned vegetables. Low-sodium or reduced-sodium tomato sauce and paste. Low-sodium or reduced-sodium tomato and vegetable juices.  Fruits Fresh, frozen, and canned fruit. Fruit juice.  Meat and Other Protein Products Low-sodium canned tuna and salmon. Fresh or frozen meat, poultry, seafood, and fish. Lamb. Unsalted nuts. Dried beans, peas, and lentils without added salt. Unsalted canned beans. Homemade soups without  salt. Eggs.  Dairy Milk. Soy milk. Ricotta cheese. Low-sodium or reduced-sodium cheeses. Yogurt.  Condiments Fresh and dried herbs and spices. Salt-free seasonings. Onion and garlic powders. Low-sodium varieties of mustard and ketchup. Lemon juice.  Fats and Oils Reduced-sodium salad dressings. Unsalted butter.  Other Unsalted popcorn and pretzels.  The items listed above may not be a complete list of recommended foods or  beverages. Contact your dietitian for more options. WHAT FOODS ARE NOT RECOMMENDED? Grains Instant hot cereals. Bread stuffing, pancake, and biscuit mixes. Croutons. Seasoned rice or pasta mixes. Noodle soup cups. Boxed or frozen macaroni and cheese. Self-rising flour. Regular salted crackers. Vegetables Regular canned vegetables. Regular canned tomato sauce and paste. Regular tomato and vegetable juices. Frozen vegetables in sauces. Salted french fries. Olives. Rosita Fire. Relishes. Sauerkraut. Salsa. Meat and Other Protein Products Salted, canned, smoked, spiced, or pickled meats, seafood, or fish. Bacon, ham, sausage, hot dogs, corned beef, chipped beef, and packaged luncheon meats. Salt pork. Jerky. Pickled herring. Anchovies, regular canned tuna, and sardines. Salted nuts. Dairy Processed cheese and cheese spreads. Cheese curds. Blue cheese and cottage cheese. Buttermilk.  Condiments Onion and garlic salt, seasoned salt, table salt, and sea salt. Canned and packaged gravies. Worcestershire sauce. Tartar sauce. Barbecue sauce. Teriyaki sauce. Soy sauce, including reduced sodium. Steak sauce. Fish sauce. Oyster sauce. Cocktail sauce. Horseradish. Regular ketchup and mustard. Meat flavorings and tenderizers. Bouillon cubes. Hot sauce. Tabasco sauce. Marinades. Taco seasonings. Relishes. Fats and Oils Regular salad dressings. Salted butter. Margarine. Ghee. Bacon fat.  Other Potato and tortilla chips. Corn chips and puffs. Salted popcorn and pretzels. Canned or dried soups. Pizza. Frozen entrees and pot pies.  The items listed above may not be a complete list of foods and beverages to avoid. Contact your dietitian for more information. Document Released: 02/04/2002 Document Revised: 08/20/2013 Document Reviewed: 06/19/2013 [facility] Patient Information 2015 [place], [place]. This information is not intended to replace advice given to you by your health care provider. Make sure you discuss any  questions you have with your health care provider.

## 2014-03-25 ENCOUNTER — Encounter: Payer: Self-pay | Admitting: Gastroenterology

## 2014-03-25 ENCOUNTER — Ambulatory Visit (INDEPENDENT_AMBULATORY_CARE_PROVIDER_SITE_OTHER): Payer: Medicare HMO | Admitting: Gastroenterology

## 2014-03-25 ENCOUNTER — Telehealth: Payer: Self-pay | Admitting: Gastroenterology

## 2014-03-25 VITALS — BP 132/70 | HR 64 | Temp 97.7°F | Ht 77.0 in | Wt 247.0 lb

## 2014-03-25 DIAGNOSIS — D62 Acute posthemorrhagic anemia: Secondary | ICD-10-CM

## 2014-03-25 DIAGNOSIS — K625 Hemorrhage of anus and rectum: Secondary | ICD-10-CM

## 2014-03-25 LAB — BASIC METABOLIC PANEL
BUN: 22 mg/dL (ref 6–23)
CALCIUM: 8.7 mg/dL (ref 8.4–10.5)
CO2: 28 mEq/L (ref 19–32)
Chloride: 99 mEq/L (ref 96–112)
Creat: 2.06 mg/dL — ABNORMAL HIGH (ref 0.50–1.35)
Glucose, Bld: 148 mg/dL — ABNORMAL HIGH (ref 70–99)
Potassium: 4.4 mEq/L (ref 3.5–5.3)
SODIUM: 135 meq/L (ref 135–145)

## 2014-03-25 NOTE — Telephone Encounter (Signed)
Dr. Claiborne Billings PA-C,  Patient seen today for follow-up of recent hospitalization for acute onset hematochezia. Develops significant associated anemia. We recommend colonoscopy. Patient concerned about undergoing procedure, states he was previously told he was too high risk. In your opinion which he be a candidate for conscious sedation/colonoscopy from cardiac standpoint. Seen by you guys 03/24/14. I appreciate any input from you guys.  Leanna Battles. [facility]

## 2014-03-25 NOTE — Progress Notes (Signed)
Primary Care Physician:  Octavio Graves, DO  Primary Gastroenterologist:  Garfield Cornea, MD   Chief Complaint  Patient presents with  . Anemia    pt said he recently had to have blood    HPI:  Wayne Gordon is a 80 y.o. male here at the request of Octavio Graves, DO, for further evaluation of anemia and recent hospitalization for hematochezia. He was hospitalized back in June with acute onset painless hematochezia. CT scan of the abdomen and pelvis without contrast showed extensive diverticulosis. He had punctate lucent lesions in the pelvic bones bilaterally which could reflect osteopenia but radiologist suggested ruling out multiple myeloma. Patient did not require blood transfusion at that time. When he presented his hemoglobin was 12.0. At time of discharge was 8.4. Looking back through his records he had a hemoglobin of 12.27 February 2012 (5 months after mitral valve replacement). A she recently received one unit of packed red blood cells, most recent hemoglobin 9.1 on July 13.  Seen by Dr. Carol Ada during hospitalization. Colonoscopy offered but patient declined. States he was worried about his heart disease and if he could tolerate the procedure.   Taking metamucil since discharge. BM most days. No melena. Stool dark on iron. No further bleeding. Heartburn controlled on omeprazole. On meds for couple of years. No dysphagia. No n/v, weight loss.  Takes daily ASA but no other NSAIDs.   Current Outpatient Prescriptions  Medication Sig Dispense Refill  . aspirin EC 81 MG tablet Take 1 tablet (81 mg total) by mouth daily.      [name] Kitchen CALCIUM-MAGNESIUM-ZINC PO Take 2 tablets by mouth daily.      . carvedilol (COREG) 25 MG tablet Take 0.5 tablets (12.5 mg total) by mouth 2 (two) times daily with a meal.  60 tablet  6  . furosemide (LASIX) 40 MG tablet Take 1 tablet (40 mg total) by mouth daily. Takes extra 1/2 when needed  45 tablet  11  . glipiZIDE (GLUCOTROL XL) 10 MG 24 hr tablet Take 10 mg by  mouth 2 times daily at 12 noon and 4 pm. Pt takes it in the AM and PM      . Multiple Vitamins-Minerals (MULTIVITAMINS THER. W/MINERALS) TABS Take 1 tablet by mouth daily.      . Omega-3 Fatty Acids (FISH OIL) 1000 MG CAPS Take 1 capsule by mouth 2 (two) times daily.      [name] Kitchen omeprazole (PRILOSEC) 20 MG capsule Take 20 mg by mouth daily.      . rosuvastatin (CRESTOR) 20 MG tablet Take 20 mg by mouth every evening.      . testosterone cypionate (DEPOTESTOTERONE CYPIONATE) 200 MG/ML injection Inject 200 mg into the muscle every 14 (fourteen) days.       No current facility-administered medications for this visit.    Allergies as of 03/25/2014 - Review Complete 03/25/2014  Allergen Reaction Noted  . Ace inhibitors  02/03/2014  . Penicillins Palpitations 10/10/2011    Past Medical History  Diagnosis Date  . Hypertension   . Hyperlipidemia   . Heart murmur   . Shortness of breath     "lying down & w/exertion"  . Diabetes mellitus   . GERD (gastroesophageal reflux disease)   . Arthritis   . Chronic systolic heart failure   . Mitral regurgitation     S/P mitral valve repair [[id number]][  . Ischemic cardiomyopathy     S/P CABG x 3; EF 25%  . Carotid artery disease  37-90% LICA  . Allergy to ACE inhibitors     Angioedema  . Renal insufficiency   . Coronary artery disease   . Myocardial infarction   . Dysrhythmia     heart block  . ICD (implantable cardiac defibrillator) in place 03/28/2012  . CHF (congestive heart failure)     Past Surgical History  Procedure Laterality Date  . Knee arthroscopy  ~ 2008    right  . Nasal sinus surgery  1990's    right  . Mitral valve repair  10/17/2011    Procedure: MITRAL VALVE REPAIR (MVR);  Surgeon: Rexene Alberts, MD;  Location: [facility];  Service: Open Heart Surgery;  Laterality: N/A;  . Coronary artery bypass graft  10/17/2011    Procedure: CORONARY ARTERY BYPASS GRAFTING (CABG);  Surgeon: Rexene Alberts, MD;  Location: [facility];  Service:  Open Heart Surgery;  Laterality: N/A;  . Icd  03/28/2012    Family History  Problem Relation Age of Onset  . Heart disease Mother     History   Social History  . Marital Status: Married    Spouse Name: N/A    Number of Children: N/A  . Years of Education: N/A   Occupational History  . Retired    Social History Main Topics  . Smoking status: Never Smoker   . Smokeless tobacco: Never Used     Comment: Never smoked  . Alcohol Use: No  . Drug Use: No  . Sexual Activity: Yes   Other Topics Concern  . Not on file   Social History Narrative  . No narrative on file      ROS:  General: Negative for anorexia, weight loss, fever, chills, fatigue, weakness. Eyes: Negative for vision changes.  ENT: Negative for hoarseness, difficulty swallowing , nasal congestion. CV: Negative for chest pain, angina, palpitations, dyspnea on exertion. Positive peripheral edema post discharge but improved.  Respiratory: Negative for dyspnea at rest, dyspnea on exertion, cough, sputum, wheezing.  GI: See history of present illness. GU:  Negative for dysuria, hematuria, urinary incontinence, urinary frequency, nocturnal urination.  MS: Negative for joint pain, low back pain.  Derm: Negative for rash or itching.  Neuro: Negative for weakness, abnormal sensation, seizure, frequent headaches, memory loss, confusion.  Psych: Negative for anxiety, depression, suicidal ideation, hallucinations.  Endo: Negative for unusual weight change.  Heme: Negative for bruising or bleeding. Allergy: Negative for rash or hives.    Physical Examination:  BP 132/70  Pulse 64  Temp(Src) 97.7 F (36.5 C) (Oral)  Ht _0  (1.956 m)  Wt 247 lb (112.038 kg)  BMI 29.28 kg/m2   General: Well-nourished, well-developed in no acute distress.  Head: Normocephalic, atraumatic.   Eyes: Conjunctiva pink, no icterus. Mouth: Oropharyngeal mucosa moist and pink , no lesions erythema or exudate. Neck: Supple without  thyromegaly, masses, or lymphadenopathy.  Lungs: Clear to auscultation bilaterally.  Heart: Regular rate and rhythm, no murmurs rubs or gallops.  Abdomen: Bowel sounds are normal, nontender, nondistended, no hepatosplenomegaly or masses, no abdominal bruits or    hernia , no rebound or guarding.   Rectal: Not performed. Extremities: No lower extremity edema. No clubbing or deformities.  Neuro: Alert and oriented x 4 , grossly normal neurologically.  Skin: Warm and dry, no rash or jaundice.   Psych: Alert and cooperative, normal mood and affect.  Labs: 02/17/2014  Hemoglobin 8, hematocrit 24.4, platelets 314,000, white blood cell count 6300, calcium 8.5, creatinine 2.02, BUN 40, total  bilirubin 0.5, alkaline phosphatase 50, AST 31, ALT 74 elevated, albumin 3.6  03/10/2014  Total bilirubin 0.6, alkaline phosphatase 45, AST 27, ALT 48, albumin 3.9, BUN 27, creatinine 1.91, hemoglobin 9.1, hematocrit 28.4, MCV 78.5, white blood cell count 6500, platelets 275,000.  Imaging Studies: No results found.

## 2014-03-25 NOTE — Patient Instructions (Signed)
1. Please have your doctor send copy of hemoglobin to [place] when you have it done. 2. I will address need for colonoscopy with Dr. Jena Gauss and Dr. Ladona Ridgel. We will give you further instructions. Hopefully within next week or so. Call if you have further bleeding.

## 2014-03-25 NOTE — Assessment & Plan Note (Addendum)
80 year old gentleman with recent hospitalization for acute onset moderate volume painless hematochezia. Dropped his hemoglobin from 12-8 during hospitalization. Ultimately received one unit of packed red blood cells as an outpatient with current hemoglobin 9.1. He denies any further bleeding. He had mild anemia noted with a hemoglobin of 12.1 in July 2013. Overall asymptomatic. 2 years ago started medication for heartburn. Would offer patient first ever colonoscopy. He is apprehensive given his heart disease. Upon the patient's request, we will discuss further with his cardiologist Dr. Ladona Ridgel. In the meantime he'll let us know he has any recurrent bleeding. He states he is scheduled for lab work next month with his endocrinologist. He will have CBC drawn and sent to [place].

## 2014-03-26 ENCOUNTER — Telehealth: Payer: Self-pay | Admitting: Physician Assistant

## 2014-03-26 NOTE — Telephone Encounter (Signed)
Message copied by Thompson Grayer on Wed Mar 26, 2014 11:02 AM ------      Message from: Dyann Kief      Created: Wed Mar 26, 2014  9:45 AM       creatnine 2.06. Hold off on starting aldactone back at this time. ------

## 2014-03-26 NOTE — Telephone Encounter (Signed)
Patient would like lab results / tgs

## 2014-03-26 NOTE — Telephone Encounter (Signed)
Spoke to patient concerning lab/test results/instructions from provider. Patient understood.    

## 2014-04-01 ENCOUNTER — Ambulatory Visit (INDEPENDENT_AMBULATORY_CARE_PROVIDER_SITE_OTHER): Payer: Medicare HMO | Admitting: *Deleted

## 2014-04-01 ENCOUNTER — Telehealth: Payer: Self-pay | Admitting: Cardiology

## 2014-04-01 DIAGNOSIS — I5022 Chronic systolic (congestive) heart failure: Secondary | ICD-10-CM

## 2014-04-01 DIAGNOSIS — I428 Other cardiomyopathies: Secondary | ICD-10-CM

## 2014-04-01 NOTE — Progress Notes (Signed)
Remote ICD transmission.   

## 2014-04-01 NOTE — Telephone Encounter (Signed)
LMOVM reminding pt to send remote transmission.   

## 2014-04-03 LAB — MDC_IDC_ENUM_SESS_TYPE_REMOTE
Brady Statistic RV Percent Paced: 18 %
Lead Channel Impedance Value: 477 Ohm
Lead Channel Sensing Intrinsic Amplitude: 12.7 mV
MDC IDC MSMT BATTERY VOLTAGE: 3.13 V
MDC IDC MSMT LEADCHNL RA SENSING INTR AMPL: 3.2 mV
MDC IDC PG SERIAL: 60687186
Zone Setting Detection Interval: 193.55 ms

## 2014-04-04 ENCOUNTER — Encounter: Payer: Self-pay | Admitting: Cardiology

## 2014-04-08 ENCOUNTER — Encounter: Payer: Self-pay | Admitting: Cardiology

## 2014-04-08 NOTE — Telephone Encounter (Signed)
I called Dr.Taylor's office, he is in the hospital today, his nurse is out on vacation. She said he will not be back in the office until Thursday.

## 2014-04-08 NOTE — Telephone Encounter (Signed)
He would be an acceptable risk for colonoscopy and should do fine with IV sedation. GT

## 2014-04-08 NOTE — Telephone Encounter (Signed)
Wayne Gordon, I have not received back any response on this message sent to cardiology. Will you please call their office and speak with nurse. It has been two weeks.

## 2014-04-09 NOTE — Telephone Encounter (Signed)
Noted  

## 2014-04-09 NOTE — Progress Notes (Signed)
Noted  

## 2014-04-09 NOTE — Telephone Encounter (Signed)
I spoke with pt- he wants to think about the procedures and will call us back. I informed him that he only has 30 days from his office visit to schedule and if he decides after 30 days, he would need another ov. He verbalized understanding and said he would call back in a day or two.

## 2014-04-09 NOTE — Telephone Encounter (Signed)
Wayne Gordon, see my OV note addendum. He needs to get schedule before his 30 days is up.

## 2014-04-09 NOTE — Progress Notes (Signed)
I spoke with the pt, he wants to think about it and calls [place] back to schedule. I informed him that if he doesn't have it done within 30 days of his office visit, that he would have to come back in for another office visit. He verbalized understanding and said he would call us back in a day or two.

## 2014-04-09 NOTE — Progress Notes (Signed)
Please let patient know that we spoke with Dr. Greg Taylor about patient undergoing a colonoscopy. He states he should do fine with it (see 03/25/14 telephone encounter).   Please offer patient a colonoscopy +/-EGD for dx of rectal bleeding, anemia, gerd. Day of prep glipizide 5mg BID. Patient has a defibrillator. He should take his coreg morning of procedure.  

## 2014-04-11 ENCOUNTER — Encounter: Payer: Self-pay | Admitting: Internal Medicine

## 2014-04-14 ENCOUNTER — Encounter (HOSPITAL_COMMUNITY): Payer: Self-pay | Admitting: Pharmacy Technician

## 2014-04-14 ENCOUNTER — Other Ambulatory Visit: Payer: Self-pay | Admitting: Internal Medicine

## 2014-04-14 DIAGNOSIS — K625 Hemorrhage of anus and rectum: Secondary | ICD-10-CM

## 2014-04-14 DIAGNOSIS — K219 Gastro-esophageal reflux disease without esophagitis: Secondary | ICD-10-CM

## 2014-04-14 DIAGNOSIS — D509 Iron deficiency anemia, unspecified: Secondary | ICD-10-CM

## 2014-04-14 MED ORDER — PEG 3350-KCL-NA BICARB-NACL 420 G PO SOLR: 4000.0000 mL | ORAL | Status: DC

## 2014-04-14 NOTE — Telephone Encounter (Signed)
TCS+/-EGD is scheduled for 8/27 w/RMR and I have spoken to Mr. Barrella as well as mailed him instructions

## 2014-04-24 ENCOUNTER — Ambulatory Visit (HOSPITAL_COMMUNITY)
Admission: RE | Admit: 2014-04-24 | Discharge: 2014-04-24 | Disposition: A | Discharge status: Home / Self Care | Payer: Medicare HMO | Source: Ambulatory Visit | Attending: Internal Medicine | Admitting: Internal Medicine

## 2014-04-24 ENCOUNTER — Encounter (HOSPITAL_COMMUNITY)
Admission: RE | Disposition: A | Discharge status: Home / Self Care | Payer: Self-pay | Source: Ambulatory Visit | Attending: Internal Medicine

## 2014-04-24 ENCOUNTER — Encounter (HOSPITAL_COMMUNITY): Payer: Self-pay | Admitting: *Deleted

## 2014-04-24 DIAGNOSIS — Z79899 Other long term (current) drug therapy: Secondary | ICD-10-CM | POA: Insufficient documentation

## 2014-04-24 DIAGNOSIS — D509 Iron deficiency anemia, unspecified: Secondary | ICD-10-CM

## 2014-04-24 DIAGNOSIS — Z95810 Presence of automatic (implantable) cardiac defibrillator: Secondary | ICD-10-CM | POA: Diagnosis not present

## 2014-04-24 DIAGNOSIS — R195 Other fecal abnormalities: Secondary | ICD-10-CM

## 2014-04-24 DIAGNOSIS — K625 Hemorrhage of anus and rectum: Secondary | ICD-10-CM

## 2014-04-24 DIAGNOSIS — D649 Anemia, unspecified: Secondary | ICD-10-CM

## 2014-04-24 DIAGNOSIS — Z7982 Long term (current) use of aspirin: Secondary | ICD-10-CM | POA: Diagnosis not present

## 2014-04-24 DIAGNOSIS — K219 Gastro-esophageal reflux disease without esophagitis: Secondary | ICD-10-CM | POA: Diagnosis not present

## 2014-04-24 DIAGNOSIS — I10 Essential (primary) hypertension: Secondary | ICD-10-CM | POA: Diagnosis not present

## 2014-04-24 DIAGNOSIS — K2940 Chronic atrophic gastritis without bleeding: Secondary | ICD-10-CM | POA: Diagnosis not present

## 2014-04-24 DIAGNOSIS — F17200 Nicotine dependence, unspecified, uncomplicated: Secondary | ICD-10-CM | POA: Insufficient documentation

## 2014-04-24 DIAGNOSIS — K449 Diaphragmatic hernia without obstruction or gangrene: Secondary | ICD-10-CM | POA: Diagnosis not present

## 2014-04-24 DIAGNOSIS — E785 Hyperlipidemia, unspecified: Secondary | ICD-10-CM | POA: Diagnosis not present

## 2014-04-24 DIAGNOSIS — K5731 Diverticulosis of large intestine without perforation or abscess with bleeding: Secondary | ICD-10-CM

## 2014-04-24 DIAGNOSIS — K921 Melena: Secondary | ICD-10-CM | POA: Diagnosis not present

## 2014-04-24 DIAGNOSIS — E119 Type 2 diabetes mellitus without complications: Secondary | ICD-10-CM | POA: Insufficient documentation

## 2014-04-24 DIAGNOSIS — K5730 Diverticulosis of large intestine without perforation or abscess without bleeding: Secondary | ICD-10-CM | POA: Insufficient documentation

## 2014-04-24 DIAGNOSIS — Z951 Presence of aortocoronary bypass graft: Secondary | ICD-10-CM | POA: Insufficient documentation

## 2014-04-24 HISTORY — PX: ESOPHAGOGASTRODUODENOSCOPY: SHX5428

## 2014-04-24 HISTORY — PX: COLONOSCOPY: SHX5424

## 2014-04-24 LAB — CBC
HCT: 37.9 % — ABNORMAL LOW (ref 39.0–52.0)
Hemoglobin: 11.4 g/dL — ABNORMAL LOW (ref 13.0–17.0)
MCH: 24.4 pg — ABNORMAL LOW (ref 26.0–34.0)
MCHC: 30.1 g/dL (ref 30.0–36.0)
MCV: 81.2 fL (ref 78.0–100.0)
Platelets: 235 10*3/uL (ref 150–400)
RBC: 4.67 MIL/uL (ref 4.22–5.81)
RDW: 20.4 % — AB (ref 11.5–15.5)
WBC: 5.2 10*3/uL (ref 4.0–10.5)

## 2014-04-24 LAB — GLUCOSE, CAPILLARY: Glucose-Capillary: 130 mg/dL — ABNORMAL HIGH (ref 70–99)

## 2014-04-24 SURGERY — COLONOSCOPY
Anesthesia: Moderate Sedation

## 2014-04-24 MED ORDER — LIDOCAINE VISCOUS 2 % MT SOLN
OROMUCOSAL | Status: DC | PRN
  Administered 2014-04-24: 3 mL via OROMUCOSAL

## 2014-04-24 MED ORDER — MIDAZOLAM HCL 5 MG/5ML IJ SOLN
INTRAMUSCULAR | Status: DC | PRN
  Administered 2014-04-24 (×2): 1 mg via INTRAVENOUS
  Administered 2014-04-24: 2 mg via INTRAVENOUS

## 2014-04-24 MED ORDER — MEPERIDINE HCL 100 MG/ML IJ SOLN
INTRAMUSCULAR | Status: AC
  Filled 2014-04-24: qty 2

## 2014-04-24 MED ORDER — SODIUM CHLORIDE 0.9 % IV SOLN
INTRAVENOUS | Status: DC
  Administered 2014-04-24: 1000 mL via INTRAVENOUS

## 2014-04-24 MED ORDER — STERILE WATER FOR IRRIGATION IR SOLN
Status: DC | PRN
  Administered 2014-04-24: 15:00:00

## 2014-04-24 MED ORDER — ONDANSETRON HCL 4 MG/2ML IJ SOLN
INTRAMUSCULAR | Status: DC | PRN
  Administered 2014-04-24: 4 mg via INTRAVENOUS

## 2014-04-24 MED ORDER — LIDOCAINE VISCOUS 2 % MT SOLN
OROMUCOSAL | Status: AC
  Filled 2014-04-24: qty 15

## 2014-04-24 MED ORDER — ONDANSETRON HCL 4 MG/2ML IJ SOLN
INTRAMUSCULAR | Status: AC
  Filled 2014-04-24: qty 2

## 2014-04-24 MED ORDER — MEPERIDINE HCL 100 MG/ML IJ SOLN
INTRAMUSCULAR | Status: DC | PRN
  Administered 2014-04-24: 50 mg via INTRAVENOUS
  Administered 2014-04-24: 25 mg via INTRAVENOUS

## 2014-04-24 MED ORDER — MIDAZOLAM HCL 5 MG/5ML IJ SOLN
INTRAMUSCULAR | Status: AC
  Filled 2014-04-24: qty 10

## 2014-04-24 NOTE — Discharge Instructions (Addendum)
Colonoscopy Discharge Instructions  Read the instructions outlined below and refer to this sheet in the next few weeks. These discharge instructions provide you with general information on caring for yourself after you leave the hospital. Your doctor may also give you specific instructions. While your treatment has been planned according to the most current medical practices available, unavoidable complications occasionally occur. If you have any problems or questions after discharge, call Dr. Gala Romney at [phone]. ACTIVITY  You may resume your regular activity, but move at a slower pace for the next 24 hours.   Take frequent rest periods for the next 24 hours.   Walking will help get rid of the air and reduce the bloated feeling in your belly (abdomen).   No driving for 24 hours (because of the medicine (anesthesia) used during the test).    Do not sign any important legal documents or operate any machinery for 24 hours (because of the anesthesia used during the test).  NUTRITION  Drink plenty of fluids.   You may resume your normal diet as instructed by your doctor.   Begin with a light meal and progress to your normal diet. Heavy or fried foods are harder to digest and may make you feel sick to your stomach (nauseated).   Avoid alcoholic beverages for 24 hours or as instructed.  MEDICATIONS  You may resume your normal medications unless your doctor tells you otherwise.  WHAT YOU CAN EXPECT TODAY  Some feelings of bloating in the abdomen.   Passage of more gas than usual.   Spotting of blood in your stool or on the toilet paper.  IF YOU HAD POLYPS REMOVED DURING THE COLONOSCOPY:  No aspirin products for 7 days or as instructed.   No alcohol for 7 days or as instructed.   Eat a soft diet for the next 24 hours.  FINDING OUT THE RESULTS OF YOUR TEST Not all test results are available during your visit. If your test results are not back during the visit, make an appointment  with your caregiver to find out the results. Do not assume everything is normal if you have not heard from your caregiver or the medical facility. It is important for you to follow up on all of your test results.  SEEK IMMEDIATE MEDICAL ATTENTION IF:  You have more than a spotting of blood in your stool.   Your belly is swollen (abdominal distention).   You are nauseated or vomiting.   You have a temperature over 101.  You have abdominal pain or discomfort that is severe or gets worse throughout the day. EGD Discharge instructions Please read the instructions outlined below and refer to this sheet in the next few weeks. These discharge instructions provide you with general information on caring for yourself after you leave the hospital. Your doctor may also give you specific instructions. While your treatment has been planned according to the most current medical practices available, unavoidable complications occasionally occur. If you have any problems or questions after discharge, please call your doctor. ACTIVITY You may resume your regular activity but move at a slower pace for the next 24 hours.  Take frequent rest periods for the next 24 hours.  Walking will help expel (get rid of) the air and reduce the bloated feeling in your abdomen.  No driving for 24 hours (because of the anesthesia (medicine) used during the test).  You may shower.  Do not sign any important legal documents or operate any machinery for 24  hours (because of the anesthesia used during the test).  NUTRITION Drink plenty of fluids.  You may resume your normal diet.  Begin with a light meal and progress to your normal diet.  Avoid alcoholic beverages for 24 hours or as instructed by your caregiver.  MEDICATIONS You may resume your normal medications unless your caregiver tells you otherwise.  WHAT YOU CAN EXPECT TODAY You may experience abdominal discomfort such as a feeling of fullness or gas pains.   FOLLOW-UP Your doctor will discuss the results of your test with you.  SEEK IMMEDIATE MEDICAL ATTENTION IF ANY OF THE FOLLOWING OCCUR: Excessive nausea (feeling sick to your stomach) and/or vomiting.  Severe abdominal pain and distention (swelling).  Trouble swallowing.  Temperature over 101 F (37.8 C).  Rectal bleeding or vomiting of blood.       Can continue Prilosec daily  Diverticulosis information provided  Further recommendations to follow pending review of pathology report  Omeprazole capsules (sprinkle caps) - Rx What is this medicine? OMEPRAZOLE (oh ME pray zol) prevents the production of acid in the stomach. It is used to treat gastroesophageal reflux disease (GERD), ulcers, certain bacteria in the stomach, inflammation of the esophagus, and Zollinger-Ellison Syndrome. It is also used to treat other conditions that cause too much stomach acid. This medicine may be used for other purposes; ask your health care provider or pharmacist if you have questions. COMMON BRAND NAME(S): Prilosec What should I tell my health care provider before I take this medicine? They need to know if you have any of these conditions: -liver disease -low levels of magnesium in the blood -an unusual or allergic reaction to omeprazole, other medicines, foods, dyes, or preservatives -pregnant or trying to get pregnant -breast-feeding How should I use this medicine? Take this medicine by mouth with a glass of water. Follow the directions on the prescription label. Do not crush, break or chew the capsules. They can be opened and the contents sprinkled on a small amount of applesauce or yogurt, given with fruit juices, or swallowed immediately with water. This medicine works best if taken on an empty stomach 30 to 60 minutes before breakfast. Take your doses at regular intervals. Do not take your medicine more often than directed. Talk to your pediatrician regarding the use of this medicine in  children. Special care may be needed. Overdosage: If you think you have taken too much of this medicine contact a poison control center or emergency room at once. NOTE: This medicine is only for you. Do not share this medicine with others. What if I miss a dose? If you miss a dose, take it as soon as you can. If it is almost time for your next dose, take only that dose. Do not take double or extra doses. What may interact with this medicine? Do not take this medicine with any of the following medications: -atazanavir -clopidogrel -nelfinavir This medicine may also interact with the following medications: -ampicillin -certain medicines for anxiety or sleep -certain medicines that treat or prevent blood clots like warfarin -cyclosporine -diazepam -digoxin -disulfiram -diuretics -iron salts -phenytoin -prescription medicine for fungal or yeast infection like itraconazole, ketoconazole, voriconazole -saquinavir -tacrolimus This list may not describe all possible interactions. Give your health care provider a list of all the medicines, herbs, non-prescription drugs, or dietary supplements you use. Also tell them if you smoke, drink alcohol, or use illegal drugs. Some items may interact with your medicine. What should I watch for while using this medicine?  It can take several days before your stomach pain gets better. Check with your doctor or health care professional if your condition does not start to get better, or if it gets worse. You may need blood work done while you are taking this medicine. What side effects may I notice from receiving this medicine? Side effects that you should report to your doctor or health care professional as soon as possible: -allergic reactions like skin rash, itching or hives, swelling of the face, lips, or tongue -bone, muscle or joint pain -breathing problems -chest pain or chest tightness -dark yellow or brown urine -dizziness -fast, irregular  heartbeat -feeling faint or lightheaded -fever or sore throat -muscle spasm -palpitations -redness, blistering, peeling or loosening of the skin, including inside the mouth -seizures -tremors -unusual bleeding or bruising -unusually weak or tired -yellowing of the eyes or skin Side effects that usually do not require medical attention (Report these to your doctor or health care professional if they continue or are bothersome.): -constipation -diarrhea -dry mouth -headache -nausea This list may not describe all possible side effects. Call your doctor for medical advice about side effects. You may report side effects to FDA at 1-800-FDA-1088. Where should I keep my medicine? Keep out of the reach of children. Store at room temperature between 15 and 30 degrees C (59 and 86 degrees F). Protect from light and moisture. Throw away any unused medicine after the expiration date. NOTE: This sheet is a summary. It may not cover all possible information. If you have questions about this medicine, talk to your doctor, pharmacist, or health care provider.  2015, [employer]vier/Gold Standard. (2009-11-03 11:28:20) Diverticulosis Diverticulosis is the condition that develops when small pouches (diverticula) form in the wall of your colon. Your colon, or large intestine, is where water is absorbed and stool is formed. The pouches form when the inside layer of your colon pushes through weak spots in the outer layers of your colon. CAUSES  No one knows exactly what causes diverticulosis. RISK FACTORS  Being older than 50. Your risk for this condition increases with age. Diverticulosis is rare in people younger than 40 years. By age 13, almost everyone has it.  Eating a low-fiber diet.  Being frequently constipated.  Being overweight.  Not getting enough exercise.  Smoking.  Taking over-the-counter pain medicines, like aspirin and ibuprofen. SYMPTOMS  Most people with diverticulosis do not have  symptoms. DIAGNOSIS  Because diverticulosis often has no symptoms, health care providers often discover the condition during an exam for other colon problems. In many cases, a health care provider will diagnose diverticulosis while using a flexible scope to examine the colon (colonoscopy). TREATMENT  If you have never developed an infection related to diverticulosis, you may not need treatment. If you have had an infection before, treatment may include:  Eating more fruits, vegetables, and grains.  Taking a fiber supplement.  Taking a live bacteria supplement (probiotic).  Taking medicine to relax your colon. HOME CARE INSTRUCTIONS   Drink at least 6-8 glasses of water each day to prevent constipation.  Try not to strain when you have a bowel movement.  Keep all follow-up appointments. If you have had an infection before:  Increase the fiber in your diet as directed by your health care provider or dietitian.  Take a dietary fiber supplement if your health care provider approves.  Only take medicines as directed by your health care provider. SEEK MEDICAL CARE IF:   You have abdominal pain.  You  have bloating.  You have cramps.  You have not gone to the bathroom in 3 days. SEEK IMMEDIATE MEDICAL CARE IF:   Your pain gets worse.  Yourbloating becomes very bad.  You have a fever or chills, and your symptoms suddenly get worse.  You begin vomiting.  You have bowel movements that are bloody or black. MAKE SURE YOU:  Understand these instructions.  Will watch your condition.  Will get help right away if you are not doing well or get worse. Document Released: 05/12/2004 Document Revised: 08/20/2013 Document Reviewed: 07/10/2013 [facility] Patient Information 2015 [place], [place]. This information is not intended to replace advice given to you by your health care provider. Make sure you discuss any questions you have with your health care provider. Colonoscopy, Care  After Refer to this sheet in the next few weeks. These instructions provide you with information on caring for yourself after your procedure. Your health care provider may also give you more specific instructions. Your treatment has been planned according to current medical practices, but problems sometimes occur. Call your health care provider if you have any problems or questions after your procedure. WHAT TO EXPECT AFTER THE PROCEDURE  After your procedure, it is typical to have the following:  A small amount of blood in your stool.  Moderate amounts of gas and mild abdominal cramping or bloating. HOME CARE INSTRUCTIONS  Do not drive, operate machinery, or sign important documents for 24 hours.  You may shower and resume your regular physical activities, but move at a slower pace for the first 24 hours.  Take frequent rest periods for the first 24 hours.  Walk around or put a warm pack on your abdomen to help reduce abdominal cramping and bloating.  Drink enough fluids to keep your urine clear or pale yellow.  You may resume your normal diet as instructed by your health care provider. Avoid heavy or fried foods that are hard to digest.  Avoid drinking alcohol for 24 hours or as instructed by your health care provider.  Only take over-the-counter or prescription medicines as directed by your health care provider.  If a tissue sample (biopsy) was taken during your procedure:  Do not take aspirin or blood thinners for 7 days, or as instructed by your health care provider.  Do not drink alcohol for 7 days, or as instructed by your health care provider.  Eat soft foods for the first 24 hours. SEEK MEDICAL CARE IF: You have persistent spotting of blood in your stool 2-3 days after the procedure. SEEK IMMEDIATE MEDICAL CARE IF:  You have more than a small spotting of blood in your stool.  You pass large blood clots in your stool.  Your abdomen is swollen (distended).  You have  nausea or vomiting.  You have a fever.  You have increasing abdominal pain that is not relieved with medicine. Document Released: 03/29/2004 Document Revised: 06/05/2013 Document Reviewed: 04/22/2013 [facility] Patient Information 2015 [place], [place]. This information is not intended to replace advice given to you by your health care provider. Make sure you discuss any questions you have with your health care provider.

## 2014-04-24 NOTE — OR Nursing (Signed)
Patient has a [employer] implanted cardiac defibrillator. [employer] rep-Nathan notified and said that if cautery was needed during the procedure to apply a magnet directly over the device. No sound would be heard and once magnet is removed, the device would return to its normal settings.

## 2014-04-24 NOTE — Interval H&P Note (Signed)
History and Physical Interval Note:  04/24/2014 2:37 PM  Wayne Gordon  has presented today for surgery, with the diagnosis of RECTAL BLEEDING, ANEMIA , GERD  The various methods of treatment have been discussed with the patient and family. After consideration of risks, benefits and other options for treatment, the patient has consented to  Procedure(s) with comments: COLONOSCOPY (N/A) - 2:15 ESOPHAGOGASTRODUODENOSCOPY (EGD) (N/A) as a surgical intervention .  The patient's history has been reviewed, patient examined, no change in status, stable for surgery.  I have reviewed the patient's chart and labs.  Questions were answered to the patient's satisfaction.     Wayne Gordon  No change. Colonoscopy with possible EGD to follow per plan.The risks, benefits, limitations, imponderables and alternatives regarding both EGD and colonoscopy have been reviewed with the patient. Questions have been answered. All parties agreeable.

## 2014-04-24 NOTE — H&P (View-Only) (Signed)
Please let patient know that we spoke with Dr. Sharrell Ku about patient undergoing a colonoscopy. He states he should do fine with it (see 03/25/14 telephone encounter).   Please offer patient a colonoscopy +/-EGD for dx of rectal bleeding, anemia, gerd. Day of prep glipizide 5mg  BID. Patient has a defibrillator. He should take his coreg morning of procedure.

## 2014-04-24 NOTE — Interval H&P Note (Signed)
History and Physical Interval Note:  04/24/2014 2:32 PM  Wayne Gordon  has presented today for surgery, with the diagnosis of RECTAL BLEEDING, ANEMIA , GERD  The various methods of treatment have been discussed with the patient and family. After consideration of risks, benefits and other options for treatment, the patient has consented to  Procedure(s) with comments: COLONOSCOPY (N/A) - 2:15 ESOPHAGOGASTRODUODENOSCOPY (EGD) (N/A) as a surgical intervention .  The patient's history has been reviewed, patient examined, no change in status, stable for surgery.  I have reviewed the patient's chart and labs.  Questions were answered to the patient's satisfaction.     Eula Listen

## 2014-04-24 NOTE — H&P (View-Only) (Signed)
Primary Care Physician:  [name], [name], DO  Primary Gastroenterologist:  Michael Rourk, MD   Chief Complaint  Patient presents with  . Anemia    pt said he recently had to have blood    HPI:  Wayne Gordon is a 80 y.o. male here at the request of [name] [name], DO, for further evaluation of anemia and recent hospitalization for hematochezia. He was hospitalized back in June with acute onset painless hematochezia. CT scan of the abdomen and pelvis without contrast showed extensive diverticulosis. He had punctate lucent lesions in the pelvic bones bilaterally which could reflect osteopenia but radiologist suggested ruling out multiple myeloma. Patient did not require blood transfusion at that time. When he presented his hemoglobin was 12.0. At time of discharge was 8.4. Looking back through his records he had a hemoglobin of 12.27 February 2012 (5 months after mitral valve replacement). A she recently received one unit of packed red blood cells, most recent hemoglobin 9.1 on July 13.  Seen by Dr. Patrick Hung during hospitalization. Colonoscopy offered but patient declined. States he was worried about his heart disease and if he could tolerate the procedure.   Taking metamucil since discharge. BM most days. No melena. Stool dark on iron. No further bleeding. Heartburn controlled on omeprazole. On meds for couple of years. No dysphagia. No n/v, weight loss.  Takes daily ASA but no other NSAIDs.   Current Outpatient Prescriptions  Medication Sig Dispense Refill  . aspirin EC 81 MG tablet Take 1 tablet (81 mg total) by mouth daily.      . CALCIUM-MAGNESIUM-ZINC PO Take 2 tablets by mouth daily.      . carvedilol (COREG) 25 MG tablet Take 0.5 tablets (12.5 mg total) by mouth 2 (two) times daily with a meal.  60 tablet  6  . furosemide (LASIX) 40 MG tablet Take 1 tablet (40 mg total) by mouth daily. Takes extra 1/2 when needed  45 tablet  11  . glipiZIDE (GLUCOTROL XL) 10 MG 24 hr tablet Take 10 mg by  mouth 2 times daily at 12 noon and 4 pm. Pt takes it in the AM and PM      . Multiple Vitamins-Minerals (MULTIVITAMINS THER. W/MINERALS) TABS Take 1 tablet by mouth daily.      . Omega-3 Fatty Acids (FISH OIL) 1000 MG CAPS Take 1 capsule by mouth 2 (two) times daily.      . omeprazole (PRILOSEC) 20 MG capsule Take 20 mg by mouth daily.      . rosuvastatin (CRESTOR) 20 MG tablet Take 20 mg by mouth every evening.      . testosterone cypionate (DEPOTESTOTERONE CYPIONATE) 200 MG/ML injection Inject 200 mg into the muscle every 14 (fourteen) days.       No current facility-administered medications for this visit.    Allergies as of 03/25/2014 - Review Complete 03/25/2014  Allergen Reaction Noted  . Ace inhibitors  02/03/2014  . Penicillins Palpitations 10/10/2011    Past Medical History  Diagnosis Date  . Hypertension   . Hyperlipidemia   . Heart murmur   . Shortness of breath     "lying down & w/exertion"  . Diabetes mellitus   . GERD (gastroesophageal reflux disease)   . Arthritis   . Chronic systolic heart failure   . Mitral regurgitation     S/P mitral valve repair [[id number]][  . Ischemic cardiomyopathy     S/P CABG x 3; EF 25%  . Carotid artery disease       60-79% LICA  . Allergy to ACE inhibitors     Angioedema  . Renal insufficiency   . Coronary artery disease   . Myocardial infarction   . Dysrhythmia     heart block  . ICD (implantable cardiac defibrillator) in place 03/28/2012  . CHF (congestive heart failure)     Past Surgical History  Procedure Laterality Date  . Knee arthroscopy  ~ 2008    right  . Nasal sinus surgery  1990's    right  . Mitral valve repair  10/17/2011    Procedure: MITRAL VALVE REPAIR (MVR);  Surgeon: Clarence H Owen, MD;  Location: [facility] OR;  Service: Open Heart Surgery;  Laterality: N/A;  . Coronary artery bypass graft  10/17/2011    Procedure: CORONARY ARTERY BYPASS GRAFTING (CABG);  Surgeon: Clarence H Owen, MD;  Location: [facility] OR;  Service:  Open Heart Surgery;  Laterality: N/A;  . Icd  03/28/2012    Family History  Problem Relation Age of Onset  . Heart disease Mother     History   Social History  . Marital Status: Married    Spouse Name: N/A    Number of Children: N/A  . Years of Education: N/A   Occupational History  . Retired    Social History Main Topics  . Smoking status: Never Smoker   . Smokeless tobacco: Never Used     Comment: Never smoked  . Alcohol Use: No  . Drug Use: No  . Sexual Activity: Yes   Other Topics Concern  . Not on file   Social History Narrative  . No narrative on file      ROS:  General: Negative for anorexia, weight loss, fever, chills, fatigue, weakness. Eyes: Negative for vision changes.  ENT: Negative for hoarseness, difficulty swallowing , nasal congestion. CV: Negative for chest pain, angina, palpitations, dyspnea on exertion. Positive peripheral edema post discharge but improved.  Respiratory: Negative for dyspnea at rest, dyspnea on exertion, cough, sputum, wheezing.  GI: See history of present illness. GU:  Negative for dysuria, hematuria, urinary incontinence, urinary frequency, nocturnal urination.  MS: Negative for joint pain, low back pain.  Derm: Negative for rash or itching.  Neuro: Negative for weakness, abnormal sensation, seizure, frequent headaches, memory loss, confusion.  Psych: Negative for anxiety, depression, suicidal ideation, hallucinations.  Endo: Negative for unusual weight change.  Heme: Negative for bruising or bleeding. Allergy: Negative for rash or hives.    Physical Examination:  BP 132/70  Pulse 64  Temp(Src) 97.7 F (36.5 C) (Oral)  Ht 6' 5" (1.956 m)  Wt 247 lb (112.038 kg)  BMI 29.28 kg/m2   General: Well-nourished, well-developed in no acute distress.  Head: Normocephalic, atraumatic.   Eyes: Conjunctiva pink, no icterus. Mouth: Oropharyngeal mucosa moist and pink , no lesions erythema or exudate. Neck: Supple without  thyromegaly, masses, or lymphadenopathy.  Lungs: Clear to auscultation bilaterally.  Heart: Regular rate and rhythm, no murmurs rubs or gallops.  Abdomen: Bowel sounds are normal, nontender, nondistended, no hepatosplenomegaly or masses, no abdominal bruits or    hernia , no rebound or guarding.   Rectal: Not performed. Extremities: No lower extremity edema. No clubbing or deformities.  Neuro: Alert and oriented x 4 , grossly normal neurologically.  Skin: Warm and dry, no rash or jaundice.   Psych: Alert and cooperative, normal mood and affect.  Labs: 02/17/2014  Hemoglobin 8, hematocrit 24.4, platelets 314,000, white blood cell count 6300, calcium 8.5, creatinine 2.02, BUN 40, total   bilirubin 0.5, alkaline phosphatase 50, AST 31, ALT 74 elevated, albumin 3.6  03/10/2014  Total bilirubin 0.6, alkaline phosphatase 45, AST 27, ALT 48, albumin 3.9, BUN 27, creatinine 1.91, hemoglobin 9.1, hematocrit 28.4, MCV 78.5, white blood cell count 6500, platelets 275,000.  Imaging Studies: No results found.    

## 2014-04-24 NOTE — Op Note (Signed)
[facility] [address] [place], [address]   COLONOSCOPY PROCEDURE REPORT  PATIENT: Wayne Gordon, Wayne Gordon  MR#:         [id number] BIRTHDATE: 17-Jun-1934 , 80  yrs. old GENDER: Male ENDOSCOPIST: R.  Roetta Sessions, MD [facility] REFERRED BY:  Samuel Jester PROCEDURE DATE:  04/24/2014 PROCEDURE:     Diagnostic ileocolonoscopy  INDICATIONS:  Anemia; Hemoccult positive stool  INFORMED CONSENT:  The risks, benefits, alternatives and imponderables including but not limited to bleeding, perforation as well as the possibility of a missed lesion have been reviewed.  The potential for biopsy, lesion removal, etc. have also been discussed.  Questions have been answered.  All parties agreeable. Please see the history and physical in the medical record for more information.  MEDICATIONS: Versed 4 mg IV and Demerol 50 mg IV in divided doses. Xylocaine gel orally. Zofran 4 mg IV.  DESCRIPTION OF PROCEDURE:  After a digital rectal exam was performed, the EC-3890Li ([id number])  colonoscope was advanced from the anus through the rectum and colon to the area of the cecum, ileocecal valve and appendiceal orifice.  The cecum was deeply intubated.  These structures were well-seen and photographed for the record.  From the level of the cecum and ileocecal valve, the scope was slowly and cautiously withdrawn.  The mucosal surfaces were carefully surveyed utilizing scope tip deflection to facilitate fold flattening as needed.  The scope was pulled down into the rectum where a thorough examination including retroflexion was performed.    FINDINGS:  Adequate preparation. Normal rectum. Pancolonic diverticula; the remainder of the colonic mucosa appeared normal. The distal 5 cm of terminal ileal mucosa also appeared normal  THERAPEUTIC / DIAGNOSTIC MANEUVERS PERFORMED:  None  COMPLICATIONS: none  CECAL WITHDRAWAL TIME:  7 minutes  IMPRESSION:  Pancolonic  diverticulosis.  RECOMMENDATIONS: See EGD report.   _______________________________ eSigned:  R. Roetta Sessions, MD FACP [facility] 04/24/2014 3:04 PM   CC:    PATIENT NAME:  Wayne Gordon, Wayne Gordon MR#: [id number]

## 2014-04-24 NOTE — Op Note (Addendum)
[facility] [address] [place], [address]   ENDOSCOPY PROCEDURE REPORT  PATIENT: Wayne Gordon, Wayne Gordon  MR#: [id number] BIRTHDATE: 14-Sep-1933 , 80  yrs. old GENDER: Male ENDOSCOPIST: R.  Roetta Sessions, MD [facility] REFERRED BY:  Samuel Jester PROCEDURE DATE:  04/24/2014 PROCEDURE:     EGD with gastric biopsy  INDICATIONS:     Long-standing GERD; anemia; Hemoccult positive stool; negative colonoscopy  INFORMED CONSENT:   The risks, benefits, limitations, alternatives and imponderables have been discussed.  The potential for biopsy, esophogeal dilation, etc. have also been reviewed.  Questions have been answered.  All parties agreeable.  Please see the history and physical in the medical record for more information.  MEDICATIONS:     Versed 4 mg IV and Demerol 75 mg IV in divided doses. Zofran 4 mg IV. Xylocaine gel orally.  DESCRIPTION OF PROCEDURE:   The EC-3890Li ([id number]) and EG-2990i ([id number])  endoscope was introduced through the mouth and advanced to the second portion of the duodenum without difficulty or limitations.  The mucosal surfaces were surveyed very carefully during advancement of the scope and upon withdrawal.  Retroflexion view of the proximal stomach and esophagogastric junction was performed.      FINDINGS: Esophagus appeared normal. Stomach empty. Small hiatal hernia. Slightly nodular gastric mucosa diffusely. No ulcer or infiltrating process seen. Patent pylorus. Normal first and second portion of the duodenum.  THERAPEUTIC / DIAGNOSTIC MANEUVERS PERFORMED:  Biopsies abnormal gastric mucosa taken for histologic study   COMPLICATIONS:  None  IMPRESSION:   Subtle nodularity the gastric mucosa of uncertain significance-status post biopsy. Hiatal hernia.  RECOMMENDATIONS:  Continue Prilosec daily. Followup on pathology. Further recommendations to follow.    _______________________________ R. Roetta Sessions, MD FACP  [facility] eSigned:  R. Roetta Sessions, MD FACP [facility] 04/24/2014 3:18 PM     CC:  PATIENT NAME:  Godofredo, Heilbrun MR#: [id number]

## 2014-04-25 ENCOUNTER — Encounter (HOSPITAL_COMMUNITY): Payer: Self-pay | Admitting: Internal Medicine

## 2014-05-12 ENCOUNTER — Encounter: Payer: Self-pay | Admitting: Internal Medicine

## 2014-05-12 ENCOUNTER — Telehealth: Payer: Self-pay

## 2014-05-12 NOTE — Telephone Encounter (Signed)
Letter from: Corbin Ade  Reason for Letter: Results Review  Send letter to patient.  Send copy of letter with path to referring provider and PCP.    Needs office visit with extender 6-8 weeks to decide about further evaluation of anemia and Hemoccult-positive stool

## 2014-05-12 NOTE — Telephone Encounter (Signed)
Letter mailed to pt. Wayne Gordon, please schedule ov 

## 2014-05-12 NOTE — Progress Notes (Signed)
Quick Note:  Reviewed. ______ 

## 2014-05-13 ENCOUNTER — Encounter: Payer: Self-pay | Admitting: Internal Medicine

## 2014-05-13 NOTE — Telephone Encounter (Signed)
APPT MADE AND LETTER MAILED.  °

## 2014-05-27 ENCOUNTER — Encounter: Payer: Self-pay | Admitting: Internal Medicine

## 2014-05-30 ENCOUNTER — Ambulatory Visit (INDEPENDENT_AMBULATORY_CARE_PROVIDER_SITE_OTHER): Payer: Medicare HMO | Admitting: Internal Medicine

## 2014-05-30 ENCOUNTER — Encounter: Payer: Self-pay | Admitting: Internal Medicine

## 2014-05-30 VITALS — BP 151/91 | HR 79 | Ht 77.0 in | Wt 245.6 lb

## 2014-05-30 DIAGNOSIS — I5022 Chronic systolic (congestive) heart failure: Secondary | ICD-10-CM

## 2014-05-30 DIAGNOSIS — I5021 Acute systolic (congestive) heart failure: Secondary | ICD-10-CM

## 2014-05-30 DIAGNOSIS — I10 Essential (primary) hypertension: Secondary | ICD-10-CM

## 2014-05-30 DIAGNOSIS — Z95810 Presence of automatic (implantable) cardiac defibrillator: Secondary | ICD-10-CM

## 2014-05-30 LAB — MDC_IDC_ENUM_SESS_TYPE_INCLINIC
HighPow Impedance: 82 Ohm
Implantable Pulse Generator Serial Number: 60687186
Lead Channel Impedance Value: 606 Ohm
Lead Channel Pacing Threshold Pulse Width: 0.4 ms
Lead Channel Sensing Intrinsic Amplitude: 22.7 mV
MDC IDC MSMT LEADCHNL RA SENSING INTR AMPL: 4.3 mV
MDC IDC MSMT LEADCHNL RV PACING THRESHOLD AMPLITUDE: 0.5 V
MDC IDC STAT BRADY RV PERCENT PACED: 20 %
Zone Setting Detection Interval: 193.55 ms

## 2014-05-30 LAB — PACEMAKER DEVICE OBSERVATION

## 2014-05-30 MED ORDER — CARVEDILOL 25 MG PO TABS: ORAL_TABLET | ORAL | Status: DC

## 2014-05-30 NOTE — Assessment & Plan Note (Signed)
His Biotronik ICD is working normally. Will check in several months.

## 2014-05-30 NOTE — Patient Instructions (Addendum)
Remote monitoring is used to monitor your Pacemaker of ICD from home. This monitoring reduces the number of office visits required to check your device to one time per year. It allows [place] to keep an eye on the functioning of your device to ensure it is working properly. You are scheduled for a device check from home on January 4th. You may send your transmission at any time that day. If you have a wireless device, the transmission will be sent automatically. After your physician reviews your transmission, you will receive a postcard with your next transmission date.   Your physician wants you to follow-up in: 1 year with Dr. Court Joy will receive a reminder letter in the mail two months in advance. If you don't receive a letter, please call our office to schedule the follow-up appointment.  Your physician has recommended you make the following change in your medication:   INCREASE CARVEDILOL TO 25 MG IN THE MORNING AND 12.5MG  IN THE EVENING   Thank you for choosing [facility]!!

## 2014-05-30 NOTE — Assessment & Plan Note (Signed)
His symptoms remain class 2A. He will continue his current meds. I have asked him to increase his dose of coreg to help with blood pressure control.

## 2014-05-30 NOTE — Assessment & Plan Note (Signed)
His blood pressure has been elevated. He checks it several times a month and is in the 130--160 range. I have asked the patient to increase his coreg to 25 mg in the a.m. And 12.5 mg in the p.m.

## 2014-05-30 NOTE — Progress Notes (Signed)
HPI Mr. Glenetta BorgSmothers returns today for followup. He is a very pleasant 80 year old man with an ischemic cardiomyopathy, chronic well compensated systolic heart failure, mild renal insufficiency, stage II, diabetes, and hypertension. He has done well in the interim, status post ICD implantation. No ICD shocks. No syncope, and no peripheral edema. He does note some increased blood pressures.  He has not had palpitations.  He denies syncope or ICD shock. Allergies  Allergen Reactions  . Ace Inhibitors Other (See Comments)    Kidney issues  . Penicillins Palpitations     Current Outpatient Prescriptions  Medication Sig Dispense Refill  . aspirin EC 81 MG tablet Take 1 tablet (81 mg total) by mouth daily.      [name] Kitchen. CALCIUM-MAGNESIUM-ZINC PO Take 2 tablets by mouth daily.      . carvedilol (COREG) 25 MG tablet Take 0.5 tablets (12.5 mg total) by mouth 2 (two) times daily with a meal.  60 tablet  6  . furosemide (LASIX) 40 MG tablet Take 1 tablet (40 mg total) by mouth daily. Takes extra 1/2 when needed  45 tablet  11  . glipiZIDE (GLUCOTROL XL) 10 MG 24 hr tablet Take 10 mg by mouth 2 times daily at 12 noon and 4 pm. Pt takes it in the AM and PM      . Multiple Vitamins-Minerals (MULTIVITAMINS THER. W/MINERALS) TABS Take 1 tablet by mouth daily.      . Omega-3 Fatty Acids (FISH OIL) 1000 MG CAPS Take 1 capsule by mouth 2 (two) times daily.      [name] Kitchen. omeprazole (PRILOSEC) 20 MG capsule Take 20 mg by mouth daily.      . rosuvastatin (CRESTOR) 20 MG tablet Take 20 mg by mouth every evening.      . testosterone cypionate (DEPOTESTOTERONE CYPIONATE) 200 MG/ML injection Inject 0.08 mg into the muscle every 14 (fourteen) days.        No current facility-administered medications for this visit.     Past Medical History  Diagnosis Date  . Hypertension   . Hyperlipidemia   . Heart murmur   . Shortness of breath     "lying down & w/exertion"  . Diabetes mellitus   . GERD (gastroesophageal reflux disease)    . Arthritis   . Chronic systolic heart failure   . Mitral regurgitation     S/P mitral valve repair [[id number]][  . Ischemic cardiomyopathy     S/P CABG x 3; EF 25%  . Carotid artery disease     60-79% LICA  . Allergy to ACE inhibitors     Angioedema  . Renal insufficiency   . Coronary artery disease   . Myocardial infarction   . Dysrhythmia     heart block  . ICD (implantable cardiac defibrillator) in place 03/28/2012  . CHF (congestive heart failure)     ROS:   All systems reviewed and negative except as noted in the HPI.   Past Surgical History  Procedure Laterality Date  . Knee arthroscopy  ~ 2008    right  . Nasal sinus surgery  1990's    right  . Mitral valve repair  10/17/2011    Procedure: MITRAL VALVE REPAIR (MVR);  Surgeon: Purcell Nailslarence H Owen, MD;  [facility];  Service: Open Heart Surgery;  Laterality: N/A;  . Coronary artery bypass graft  10/17/2011    Procedure: CORONARY ARTERY BYPASS GRAFTING (CABG);  Surgeon: Purcell Nailslarence H Owen, MD;  [facility];  Service: Open Heart Surgery;  Laterality: N/A;  . Icd  03/28/2012  . Colonoscopy N/A 04/24/2014    Procedure: COLONOSCOPY;  Surgeon: Corbin Ade, MD;  Location: [facility];  Service: Endoscopy;  Laterality: N/A;  2:15  . Esophagogastroduodenoscopy N/A 04/24/2014    Procedure: ESOPHAGOGASTRODUODENOSCOPY (EGD);  Surgeon: Corbin Ade, MD;  Location: [facility];  Service: Endoscopy;  Laterality: N/A;     Family History  Problem Relation Age of Onset  . Heart disease Mother      History   Social History  . Marital Status: Married    Spouse Name: N/A    Number of Children: N/A  . Years of Education: N/A   Occupational History  . Retired    Social History Main Topics  . Smoking status: Never Smoker   . Smokeless tobacco: Never Used     Comment: Never smoked  . Alcohol Use: No  . Drug Use: No  . Sexual Activity: Yes   Other Topics Concern  . Not on file   Social History Narrative  . No  narrative on file     BP 151/91  Pulse 79  Ht 6\' 5"  (1.956 m)  Wt 245 lb 9.3 oz (111.394 kg)  BMI 29.12 kg/m2  Physical Exam:  Well appearing 80 year old man, NAD HEENT: Unremarkable Neck:  No JVD, no thyromegally Lungs:  Clear with no wheezes, rales, or rhonchi. HEART:  IRegular rate rhythm, no murmurs, no rubs, no clicks Abd:  soft, positive bowel sounds, no organomegally, no rebound, no guarding Ext:  2 plus pulses, no edema, no cyanosis, no clubbing Skin:  No rashes no nodules Neuro:  CN II through XII intact, motor grossly intact  DEVICE  Normal device function.  See PaceArt for details.   Assess/Plan:

## 2014-06-06 ENCOUNTER — Telehealth: Payer: Self-pay | Admitting: Internal Medicine

## 2014-06-06 DIAGNOSIS — I5022 Chronic systolic (congestive) heart failure: Secondary | ICD-10-CM

## 2014-06-06 MED ORDER — CARVEDILOL 25 MG PO TABS: ORAL_TABLET | ORAL | Status: DC

## 2014-06-06 NOTE — Telephone Encounter (Signed)
Patient needs RX for new dosage of Coreg sent to [facility] / [facility]

## 2014-06-06 NOTE — Telephone Encounter (Signed)
rx refilled.

## 2014-06-09 ENCOUNTER — Encounter: Payer: Self-pay | Admitting: Internal Medicine

## 2014-06-13 ENCOUNTER — Other Ambulatory Visit: Payer: Self-pay

## 2014-06-24 ENCOUNTER — Ambulatory Visit: Payer: Medicare HMO | Admitting: Gastroenterology

## 2014-06-30 ENCOUNTER — Ambulatory Visit: Payer: Medicare HMO | Admitting: Gastroenterology

## 2014-07-07 ENCOUNTER — Encounter: Payer: Self-pay | Admitting: *Deleted

## 2014-07-08 ENCOUNTER — Encounter: Payer: Self-pay | Admitting: Gastroenterology

## 2014-07-08 ENCOUNTER — Ambulatory Visit (INDEPENDENT_AMBULATORY_CARE_PROVIDER_SITE_OTHER): Payer: Medicare HMO | Admitting: Gastroenterology

## 2014-07-08 VITALS — BP 152/101 | HR 74 | Temp 97.6°F | Ht 77.0 in | Wt 246.0 lb

## 2014-07-08 DIAGNOSIS — K625 Hemorrhage of anus and rectum: Secondary | ICD-10-CM

## 2014-07-08 DIAGNOSIS — D649 Anemia, unspecified: Secondary | ICD-10-CM

## 2014-07-08 NOTE — Patient Instructions (Signed)
1. Please have your CBC done every four months to keep a check for recurrent anemia. Please have labs forwarded to [place] at [phone] (fax). 2. Call if you have any recurrent problems.

## 2014-07-08 NOTE — Assessment & Plan Note (Signed)
Previous moderate volume painless hematochezia requiring hospitalization likely diverticular bleed. No further problems. Recent EGD and colonoscopy as outlined above. Current hemoglobin normal.  Discussed with patient the importance to have a CBC drawn about every 4 months or so to evaluate for recurrent anemia. He would like to have this done with his endocrinologist, instructions provided on AVS. He will call if he has any recurrent bleeding or drop in hemoglobin.  At this time does not appear that he needs to have small bowel evaluated. FYI, he has a defibrillator. If he has a drop in his hemoglobin or unexplained bleeding week consider evaluation of small bowel in the future possibly CTE.

## 2014-07-08 NOTE — Progress Notes (Signed)
cc'ed to pcp °

## 2014-07-08 NOTE — Progress Notes (Signed)
    Primary Care Physician: [name], [name], DO  Primary Gastroenterologist:  Michael Rourk, MD   Chief Complaint  Patient presents with  . Anemia    HPI: Wayne Gordon is a 80 y.o. male here for follow-up. Seen back in July 2015 for evaluation of anemia and recent hospitalization for hematochezia. At time of discharge his hemoglobin was 8.4. Require transfusion of 1 unit of blood. Patient also had a CT scan abdomen and pelvis without contrast with punctate lucent lesions in the pelvic bones possibly osteopenia cannot rule out multiple myeloma.  At time of ileocolonoscopy in August he had pancolonic diverticula, normal distal TIA. EGD showed subtle nodularity of the gastric mucosa, biopsies consistent with chronic inflammation but no H pylori.  Patient states he feels well. Denies any further bleeding. Bowel function is normal. No abdominal pain, heartburn, dysphagia, vomiting, weight loss.  Labs obtained on 06/03/2014 by Dr. Webb, his nephrologist as outlined below: BUN 26, creatinine 1.84, total bilirubin 0.5, alkaline phosphatase 51, AST 18, ALT 26, albumin 4.6, calcium 9.3, white blood cell count 6500, hemoglobin 15.3, hematocrit 44.2, MCV 79, platelets 180,000.  Current Outpatient Prescriptions  Medication Sig Dispense Refill  . aspirin EC 81 MG tablet Take 1 tablet (81 mg total) by mouth daily.    . CALCIUM-MAGNESIUM-ZINC PO Take 2 tablets by mouth daily.    . carvedilol (COREG) 25 MG tablet Take 25 mg in am and 12.5 mg pm 135 tablet 6  . furosemide (LASIX) 40 MG tablet Take 1 tablet (40 mg total) by mouth daily. Takes extra 1/2 when needed 45 tablet 11  . glipiZIDE (GLUCOTROL XL) 10 MG 24 hr tablet Take 10 mg by mouth 2 times daily at 12 noon and 4 pm. Pt takes it in the AM and PM    . Multiple Vitamins-Minerals (MULTIVITAMINS THER. W/MINERALS) TABS Take 1 tablet by mouth daily.    . Omega-3 Fatty Acids (FISH OIL) 1000 MG CAPS Take 1 capsule by mouth 2 (two) times daily.      . omeprazole (PRILOSEC) 20 MG capsule Take 20 mg by mouth daily.    . rosuvastatin (CRESTOR) 20 MG tablet Take 20 mg by mouth every evening.    . testosterone cypionate (DEPOTESTOTERONE CYPIONATE) 200 MG/ML injection Inject 0.08 mg into the muscle every 14 (fourteen) days.      No current facility-administered medications for this visit.    Allergies as of 07/08/2014 - Review Complete 07/08/2014  Allergen Reaction Noted  . Ace inhibitors Other (See Comments) 02/03/2014  . Penicillins Palpitations 10/10/2011    ROS:  General: Negative for anorexia, weight loss, fever, chills, fatigue, weakness. ENT: Negative for hoarseness, difficulty swallowing , nasal congestion. CV: Negative for chest pain, angina, palpitations, dyspnea on exertion, peripheral edema.  Respiratory: Negative for dyspnea at rest, dyspnea on exertion, cough, sputum, wheezing.  GI: See history of present illness. GU:  Negative for dysuria, hematuria, urinary incontinence, urinary frequency, nocturnal urination.  Endo: Negative for unusual weight change.    Physical Examination:   BP 152/101 mmHg  Pulse 74  Temp(Src) 97.6 F (36.4 C) (Oral)  Ht 6' 5" (1.956 m)  Wt 246 lb (111.585 kg)  BMI 29.17 kg/m2  General: Well-nourished, well-developed in no acute distress.  Eyes: No icterus. Mouth: Oropharyngeal mucosa moist and pink , no lesions erythema or exudate. Lungs: Clear to auscultation bilaterally.  Heart: Regular rate and rhythm, no murmurs rubs or gallops.  Abdomen: Bowel sounds are normal, nontender, nondistended, no   hepatosplenomegaly or masses, no abdominal bruits or hernia , no rebound or guarding.   Extremities: No lower extremity edema. No clubbing or deformities. Neuro: Alert and oriented x 4   Skin: Warm and dry, no jaundice.   Psych: Alert and cooperative, normal mood and affect.    

## 2014-07-11 ENCOUNTER — Telehealth: Payer: Self-pay | Admitting: *Deleted

## 2014-07-11 NOTE — Telephone Encounter (Signed)
Pt states that he feels like he is in a daze ever since his medications were changed. States that he feels like his Bp is to low

## 2014-07-11 NOTE — Telephone Encounter (Signed)
Pt has noted sensation of not knowing where he is after playing golf all day and had only eaten a biscuit for breakfast.He is diabetic and does not regularly check blood sugars.He states that ever since his coreg was increased from 12.5 mg bid to  25 mg and 12.5 mg pm he notes theses sensations.I asked him to obtain a BP monitor and check BP and HR when he feels symptomatic.For now he is going to reduce Coreg back to 12.5 mg bid dose until instructed by Dr.Taylor

## 2014-07-15 NOTE — Telephone Encounter (Signed)
Ok to reduce dose of coreg. GT

## 2014-07-16 NOTE — Telephone Encounter (Signed)
Pt made aware, updated medication list 

## 2014-07-16 NOTE — Addendum Note (Signed)
Addended by: Burman Nieves T on: 07/16/2014 08:14 AM   Modules accepted: Medications

## 2014-07-29 ENCOUNTER — Telehealth: Payer: Self-pay | Admitting: Internal Medicine

## 2014-07-29 ENCOUNTER — Telehealth: Payer: Self-pay

## 2014-07-29 NOTE — Telephone Encounter (Signed)
New message  Aniceto Boss a PA with [facility] called in. She repots that the pt came in for an office visit stating that Dr. Ladona Ridgel had Informed him to come in and have an EKG and Critical labs completed. They didn't have the capability to complete the labs but they were able to complete the EKG and they are faxing it over for review.  PA-Jones also reports the Pt came in with brief pain and High BP. She says she will document that on the fax as well.

## 2014-07-29 NOTE — Telephone Encounter (Signed)
Forward to Dr.Taylor.

## 2014-07-29 NOTE — Telephone Encounter (Signed)
Patient apparently went to pcp today and asked for EKG after 911 saw him this morning,he was not transported to the ED as he states they told him he was ok but should call his cardiologist.PCP note states patient was told by Dr.Taylor to get EKG and lab work,patient states he never stated that to anyone,except he felt he needed an EKG.He denies having any CP at pcp office.He report his BP in pcp office was 150/100 and 148/90. He independently reduced his Coreg from 25 mg am and 12.5 mg pm without discussing with Dr.Taylor.Dr.Taylor was notified and agreed patient could go back to 12.5 mg BID dosing on 07/16/14. Patient asked me if there is any other BP medication he can take other than Coreg.We have not received EKG form pcp office. I have spoken Dennis Bast RN, Dr.Taylor's nurse in [place] and she has not received EKG either and is still attempting to reach pcp

## 2014-07-29 NOTE — Telephone Encounter (Signed)
Follow Up  Calling to follow up on the EKG that was faxed over for review//sr

## 2014-07-29 NOTE — Telephone Encounter (Signed)
Patient called to say he was awakened this am from sleep with pain below defibrillator in upper left chest.lasted for apprx 35 seconds without any other sx's.he called 911, they took his VS and told him they were "ok" and was not transported to hospital.Pt feels fine now.I have asked him to call back if sx's return

## 2014-07-29 NOTE — Telephone Encounter (Signed)
Called and was put on hold then transferred to a voicemail with Dr. Charm Barges.  We not not received an email

## 2014-07-29 NOTE — Telephone Encounter (Signed)
Spoke with Lynden Ang is [facility] and she has not received an EKG either.  She did however speak to the patient.  Dr. Ladona Ridgel never recommended patient go to the MD nor did he request labs

## 2014-08-05 NOTE — Telephone Encounter (Signed)
Dr. Ladona Ridgel recommends calling the patient and seeing if he needs to be seen when he is in [place] next.  Will forward to [facility]

## 2014-08-05 NOTE — Telephone Encounter (Signed)
He is there 09/01/14 and can be put on at end of day or 09/22/14 at end of day

## 2014-08-05 NOTE — Telephone Encounter (Signed)
Pt feels fine,thought he may have had muscle spasm but wife wants him seen by Dr.Taylor anyway.Dr.Taylor will not be here in [place] again until March 2016.   Will message [place] office for advise

## 2014-08-06 NOTE — Telephone Encounter (Signed)
Apt made for Dr.Taylor in [place] on 09/22/14 at 11:45 am

## 2014-08-07 ENCOUNTER — Encounter (HOSPITAL_COMMUNITY): Payer: Self-pay | Admitting: Cardiovascular Disease

## 2014-09-01 ENCOUNTER — Ambulatory Visit: Payer: Medicare HMO | Admitting: *Deleted

## 2014-09-01 ENCOUNTER — Encounter: Payer: Medicare HMO | Admitting: Internal Medicine

## 2014-09-01 ENCOUNTER — Emergency Department (HOSPITAL_COMMUNITY): Payer: Medicare HMO

## 2014-09-01 ENCOUNTER — Observation Stay (HOSPITAL_COMMUNITY)
Admission: EM | Admit: 2014-09-01 | Discharge: 2014-09-02 | Disposition: A | Discharge status: Home / Self Care | Payer: Medicare HMO | Attending: Internal Medicine | Admitting: Internal Medicine

## 2014-09-01 ENCOUNTER — Encounter (HOSPITAL_COMMUNITY): Payer: Self-pay | Admitting: *Deleted

## 2014-09-01 ENCOUNTER — Other Ambulatory Visit: Payer: Self-pay

## 2014-09-01 DIAGNOSIS — I10 Essential (primary) hypertension: Secondary | ICD-10-CM | POA: Diagnosis not present

## 2014-09-01 DIAGNOSIS — R011 Cardiac murmur, unspecified: Secondary | ICD-10-CM | POA: Diagnosis not present

## 2014-09-01 DIAGNOSIS — N289 Disorder of kidney and ureter, unspecified: Secondary | ICD-10-CM | POA: Diagnosis not present

## 2014-09-01 DIAGNOSIS — K219 Gastro-esophageal reflux disease without esophagitis: Secondary | ICD-10-CM | POA: Insufficient documentation

## 2014-09-01 DIAGNOSIS — Z951 Presence of aortocoronary bypass graft: Secondary | ICD-10-CM | POA: Diagnosis not present

## 2014-09-01 DIAGNOSIS — Z79899 Other long term (current) drug therapy: Secondary | ICD-10-CM | POA: Diagnosis not present

## 2014-09-01 DIAGNOSIS — I4949 Other premature depolarization: Secondary | ICD-10-CM | POA: Diagnosis not present

## 2014-09-01 DIAGNOSIS — Z880 Allergy status to penicillin: Secondary | ICD-10-CM | POA: Insufficient documentation

## 2014-09-01 DIAGNOSIS — N183 Chronic kidney disease, stage 3 unspecified: Secondary | ICD-10-CM

## 2014-09-01 DIAGNOSIS — M1990 Unspecified osteoarthritis, unspecified site: Secondary | ICD-10-CM | POA: Insufficient documentation

## 2014-09-01 DIAGNOSIS — I779 Disorder of arteries and arterioles, unspecified: Secondary | ICD-10-CM | POA: Diagnosis not present

## 2014-09-01 DIAGNOSIS — I340 Nonrheumatic mitral (valve) insufficiency: Secondary | ICD-10-CM | POA: Diagnosis not present

## 2014-09-01 DIAGNOSIS — E785 Hyperlipidemia, unspecified: Secondary | ICD-10-CM | POA: Diagnosis present

## 2014-09-01 DIAGNOSIS — I2510 Atherosclerotic heart disease of native coronary artery without angina pectoris: Secondary | ICD-10-CM | POA: Insufficient documentation

## 2014-09-01 DIAGNOSIS — Z7982 Long term (current) use of aspirin: Secondary | ICD-10-CM | POA: Diagnosis not present

## 2014-09-01 DIAGNOSIS — I252 Old myocardial infarction: Secondary | ICD-10-CM | POA: Insufficient documentation

## 2014-09-01 DIAGNOSIS — E119 Type 2 diabetes mellitus without complications: Secondary | ICD-10-CM | POA: Insufficient documentation

## 2014-09-01 DIAGNOSIS — Z95810 Presence of automatic (implantable) cardiac defibrillator: Secondary | ICD-10-CM | POA: Diagnosis not present

## 2014-09-01 DIAGNOSIS — I5022 Chronic systolic (congestive) heart failure: Secondary | ICD-10-CM | POA: Insufficient documentation

## 2014-09-01 DIAGNOSIS — I255 Ischemic cardiomyopathy: Secondary | ICD-10-CM

## 2014-09-01 DIAGNOSIS — R079 Chest pain, unspecified: Principal | ICD-10-CM

## 2014-09-01 DIAGNOSIS — J90 Pleural effusion, not elsewhere classified: Secondary | ICD-10-CM | POA: Insufficient documentation

## 2014-09-01 LAB — BASIC METABOLIC PANEL
Anion gap: 5 (ref 5–15)
BUN: 24 mg/dL — AB (ref 6–23)
CHLORIDE: 98 meq/L (ref 96–112)
CO2: 31 mmol/L (ref 19–32)
CREATININE: 1.74 mg/dL — AB (ref 0.50–1.35)
Calcium: 9.2 mg/dL (ref 8.4–10.5)
GFR calc Af Amer: 41 mL/min — ABNORMAL LOW (ref >90)
GFR calc non Af Amer: 36 mL/min — ABNORMAL LOW (ref >90)
GLUCOSE: 223 mg/dL — AB (ref 70–99)
Potassium: 3.8 mmol/L (ref 3.5–5.1)
Sodium: 134 mmol/L — ABNORMAL LOW (ref 135–145)

## 2014-09-01 LAB — HEPATIC FUNCTION PANEL
ALBUMIN: 4.4 g/dL (ref 3.5–5.2)
ALK PHOS: 48 U/L (ref 39–117)
ALT: 36 U/L (ref 0–53)
AST: 27 U/L (ref 0–37)
BILIRUBIN TOTAL: 1.3 mg/dL — AB (ref 0.3–1.2)
Bilirubin, Direct: 0.3 mg/dL (ref 0.0–0.3)
Indirect Bilirubin: 1 mg/dL — ABNORMAL HIGH (ref 0.3–0.9)
Total Protein: 7.3 g/dL (ref 6.0–8.3)

## 2014-09-01 LAB — CBC
HCT: 53.2 % — ABNORMAL HIGH (ref 39.0–52.0)
Hemoglobin: 18 g/dL — ABNORMAL HIGH (ref 13.0–17.0)
MCH: 32.1 pg (ref 26.0–34.0)
MCHC: 33.8 g/dL (ref 30.0–36.0)
MCV: 95 fL (ref 78.0–100.0)
Platelets: 159 10*3/uL (ref 150–400)
RBC: 5.6 MIL/uL (ref 4.22–5.81)
RDW: 14.8 % (ref 11.5–15.5)
WBC: 12.1 10*3/uL — ABNORMAL HIGH (ref 4.0–10.5)

## 2014-09-01 LAB — PROTIME-INR
INR: 1.02 (ref 0.00–1.49)
PROTHROMBIN TIME: 13.5 s (ref 11.6–15.2)

## 2014-09-01 LAB — TROPONIN I: Troponin I: <0.03 ng/mL (ref <0.031)

## 2014-09-01 LAB — LIPASE, BLOOD: LIPASE: 36 U/L (ref 11–59)

## 2014-09-01 MED ORDER — NITROGLYCERIN 2 % TD OINT
0.5000 [in_us] | TOPICAL_OINTMENT | Freq: Three times a day (TID) | TRANSDERMAL | Status: DC
  Administered 2014-09-02 (×2): 0.5 [in_us] via TOPICAL
  Filled 2014-09-01 (×3): qty 1

## 2014-09-01 MED ORDER — ASPIRIN 81 MG PO CHEW
324.0000 mg | CHEWABLE_TABLET | Freq: Once | ORAL | Status: AC
  Administered 2014-09-01: 243 mg via ORAL
  Filled 2014-09-01: qty 4

## 2014-09-01 NOTE — H&P (Signed)
Wayne Gordon is an 81 y.o. male.    Pcp: Octavio Graves ([place], ) Dr. Lovena Le (Cardiology) Chief Complaint: chest pain HPI: 81 yo male with CAD s/p CABG c/o chest pain starting about 5:30 left sided /epigastric area, constant, unable to described as sharp or dull,  Lasting for about 4-5 hours,  He has had several occasions over the past month but these only lasted for about few minutes.  Pt presented to night due to the fact that this pain was lasting longer than usual.  Denies fever, chills, cough, sob, n/v, heartburn.  No recent stress test.  Pt will be admitted for w/up of chest pain.    Past Medical History  Diagnosis Date  . Hypertension   . Hyperlipidemia   . Heart murmur   . Shortness of breath     "lying down & w/exertion"  . Diabetes mellitus   . GERD (gastroesophageal reflux disease)   . Arthritis   . Chronic systolic heart failure   . Mitral regurgitation     S/P mitral valve repair [[id number]][  . Ischemic cardiomyopathy     S/P CABG x 3; EF 25%  . Carotid artery disease     81-85% LICA  . Allergy to ACE inhibitors     Angioedema  . Renal insufficiency   . Coronary artery disease   . Myocardial infarction   . Dysrhythmia     heart block  . ICD (implantable cardiac defibrillator) in place 03/28/2012  . CHF (congestive heart failure)     Past Surgical History  Procedure Laterality Date  . Knee arthroscopy  ~ 2008    right  . Nasal sinus surgery  1990's    right  . Mitral valve repair  10/17/2011    Procedure: MITRAL VALVE REPAIR (MVR);  Surgeon: Rexene Alberts, MD;  Location: [place];  Service: Open Heart Surgery;  Laterality: N/A;  . Coronary artery bypass graft  10/17/2011    Procedure: CORONARY ARTERY BYPASS GRAFTING (CABG);  Surgeon: Rexene Alberts, MD;  Location: [place];  Service: Open Heart Surgery;  Laterality: N/A;  . Icd  03/28/2012  . Colonoscopy N/A 04/24/2014    RMR Pancolonic diverticulosis  . Esophagogastroduodenoscopy N/A 04/24/2014    RMR Subtle  nodularity the gastric mucosa of uncertain significance-status post biopsy. Hiatal hernia. chronic inflammation, no H.pylori  . Left and right heart catheterization with coronary angiogram N/A 10/12/2011    Procedure: LEFT AND RIGHT HEART CATHETERIZATION WITH CORONARY ANGIOGRAM;  Surgeon: Wellington Hampshire, MD;  Location: [place] CATH LAB;  Service: Cardiovascular;  Laterality: N/A;  . Implantable cardioverter defibrillator implant N/A 03/28/2012    Procedure: IMPLANTABLE CARDIOVERTER DEFIBRILLATOR IMPLANT;  Surgeon: Evans Lance, MD;  Location: [facility] CATH LAB;  Service: Cardiovascular;  Laterality: N/A;    Family History  Problem Relation Age of Onset  . Heart disease Mother   . Diabetes Father   . Cardiomyopathy Father    Social History:  reports that he has never smoked. He has never used smokeless tobacco. He reports that he does not drink alcohol or use illicit drugs.  Allergies:  Allergies  Allergen Reactions  . Ace Inhibitors Other (See Comments)    Kidney issues  . Penicillins Palpitations     (Not in a hospital admission)  Results for orders placed or performed during the hospital encounter of 09/01/14 (from the past 48 hour(s))  Basic metabolic panel     Status: Abnormal   Collection Time: 09/01/14  9:34  PM  Result Value Ref Range   Sodium 134 (L) 135 - 145 mmol/L    Comment: Please note change in reference range.   Potassium 3.8 3.5 - 5.1 mmol/L    Comment: Please note change in reference range.   Chloride 98 96 - 112 mEq/L   CO2 31 19 - 32 mmol/L   Glucose, Bld 223 (H) 70 - 99 mg/dL   BUN 24 (H) 6 - 23 mg/dL   Creatinine, Ser 1.74 (H) 0.50 - 1.35 mg/dL   Calcium 9.2 8.4 - 10.5 mg/dL   GFR calc non Af Amer 36 (L) >90 mL/min   GFR calc Af Amer 41 (L) >90 mL/min    Comment: (NOTE) The eGFR has been calculated using the CKD EPI equation. This calculation has not been validated in all clinical situations. eGFR's persistently <90 mL/min signify possible Chronic  Kidney Disease.    Anion gap 5 5 - 15  CBC     Status: Abnormal   Collection Time: 09/01/14  9:34 PM  Result Value Ref Range   WBC 12.1 (H) 4.0 - 10.5 K/uL   RBC 5.60 4.22 - 5.81 MIL/uL   Hemoglobin 18.0 (H) 13.0 - 17.0 g/dL   HCT 53.2 (H) 39.0 - 52.0 %   MCV 95.0 78.0 - 100.0 fL   MCH 32.1 26.0 - 34.0 pg   MCHC 33.8 30.0 - 36.0 g/dL   RDW 14.8 11.5 - 15.5 %   Platelets 159 150 - 400 K/uL  Troponin I     Status: None   Collection Time: 09/01/14  9:34 PM  Result Value Ref Range   Troponin I <0.03 <0.031 ng/mL    Comment:        NO INDICATION OF MYOCARDIAL INJURY. Please note change in reference range.   Protime-INR     Status: None   Collection Time: 09/01/14  9:34 PM  Result Value Ref Range   Prothrombin Time 13.5 11.6 - 15.2 seconds   INR 1.02 0.00 - 1.49  Lipase, blood     Status: None   Collection Time: 09/01/14  9:34 PM  Result Value Ref Range   Lipase 36 11 - 59 U/L  Hepatic function panel     Status: Abnormal   Collection Time: 09/01/14  9:34 PM  Result Value Ref Range   Total Protein 7.3 6.0 - 8.3 g/dL   Albumin 4.4 3.5 - 5.2 g/dL   AST 27 0 - 37 U/L   ALT 36 0 - 53 U/L   Alkaline Phosphatase 48 39 - 117 U/L   Total Bilirubin 1.3 (H) 0.3 - 1.2 mg/dL   Bilirubin, Direct 0.3 0.0 - 0.3 mg/dL   Indirect Bilirubin 1.0 (H) 0.3 - 0.9 mg/dL   Dg Chest Port 1 View  09/01/2014   CLINICAL DATA:  Acute onset of epigastric abdominal pain, radiating under the breasts. Initial encounter.  EXAM: PORTABLE CHEST - 1 VIEW  COMPARISON:  Chest radiograph performed 02/03/2014  FINDINGS: There is persistent elevation of the right hemidiaphragm. The left lung base is incompletely imaged on this study. Vascular congestion is noted, with mildly increased interstitial markings, possibly reflecting minimal interstitial edema. No definite pleural effusion or pneumothorax is seen.  The cardiomediastinal silhouette is borderline enlarged. An AICD is noted overlying the left chest wall, with a  single lead ending overlying the right ventricle. The patient is status post median sternotomy, with evidence of prior CABG. No acute osseous abnormalities are seen.  IMPRESSION: Persistent elevation  of the right hemidiaphragm. Vascular congestion noted, with mildly increased interstitial markings, raising question for minimal interstitial edema. Borderline cardiomegaly noted.   Electronically Signed   By: Garald Balding M.D.   On: 09/01/2014 21:46    Review of Systems  Constitutional: Negative for fever, chills, weight loss, malaise/fatigue and diaphoresis.  HENT: Negative for congestion, ear discharge, ear pain, hearing loss, nosebleeds, sore throat and tinnitus.   Eyes: Negative for blurred vision, double vision, photophobia, pain, discharge and redness.  Respiratory: Negative for cough, hemoptysis, sputum production, shortness of breath, wheezing and stridor.   Cardiovascular: Positive for chest pain. Negative for palpitations, orthopnea, claudication, leg swelling and PND.  Gastrointestinal: Negative for heartburn, nausea, vomiting, abdominal pain, diarrhea, constipation, blood in stool and melena.  Genitourinary: Negative for dysuria, urgency, frequency, hematuria and flank pain.  Musculoskeletal: Negative for myalgias, back pain, joint pain, falls and neck pain.  Skin: Negative for itching and rash.  Neurological: Negative for dizziness, tingling, tremors, sensory change, speech change, focal weakness, seizures, loss of consciousness, weakness and headaches.  Endo/Heme/Allergies: Negative for environmental allergies and polydipsia. Does not bruise/bleed easily.  Psychiatric/Behavioral: Negative for depression, suicidal ideas, hallucinations, memory loss and substance abuse. The patient is not nervous/anxious and does not have insomnia.     Blood pressure 152/84, pulse 71, temperature 97.5 F (36.4 C), temperature source Oral, resp. rate 20, height _0  (1.956 m), weight 108.863 kg (240  lb), SpO2 93 %. Physical Exam  Constitutional: He is oriented to person, place, and time. He appears well-developed and well-nourished.  HENT:  Head: Normocephalic and atraumatic.  Eyes: Conjunctivae and EOM are normal. Pupils are equal, round, and reactive to light.  Neck: Normal range of motion. Neck supple. No JVD present. No tracheal deviation present. No thyromegaly present.  Cardiovascular: Normal rate and regular rhythm.  Exam reveals no gallop and no friction rub.   No murmur heard. Midline scar  Respiratory: Effort normal and breath sounds normal.  GI: Soft. Bowel sounds are normal. He exhibits no distension. There is no tenderness. There is no rebound and no guarding.  Musculoskeletal: Normal range of motion. He exhibits no edema or tenderness.  Lymphadenopathy:    He has no cervical adenopathy.  Neurological: He is alert and oriented to person, place, and time. He has normal reflexes. He displays normal reflexes. No cranial nerve deficit. He exhibits normal muscle tone. Coordination normal.  Skin: Skin is warm and dry. No rash noted. No erythema. No pallor.  Psychiatric: He has a normal mood and affect. His behavior is normal. Judgment and thought content normal.     Assessment/Plan Chest pain ? Cardiac vs gerd Telemetry Cycle cardiac markers Check cardiac echo NPO after midnite Pt would like am team to discuss with cardiology whether or not stress testing is indicated.   CKD stage 3, renal insufficiency Creatinine appears improved since last bmpCheck cmp in am  Hyponatremia Check cmp in am  Dm2: fsbs q4h iss  CHF (EF 20-25%, on echo 2013) Cont lasix, cont carvedilol, no on ace or arb due to renal insufficiency  CAD s/p CABG Cont aspirin, crestor, carvedilol      Jani Gravel 09/01/2014, 11:31 PM

## 2014-09-01 NOTE — ED Notes (Addendum)
Pt c/o epigastric pain that radiates under right and left breast; the pain started today around 5pm; [facility] and the pain started while he was working out but eased off when he left and now the pain has come back

## 2014-09-01 NOTE — ED Provider Notes (Signed)
CSN: [id number]     Arrival date & time 09/01/14  2101 History   This chart was scribed for Vida Roller, MD by Annye Asa, ED Scribe. This patient was seen in room APA14/APA14 and the patient's care was started at 9:20 PM.    Chief Complaint  Patient presents with  . Chest Pain   The history is provided by the patient and the spouse. No language interpreter was used.     HPI Comments: Wayne Gordon is a 82 y.o. male with past medical history of DM, HTN, HLD with past surgical history of triple bypass and mitral valve repair (both Feb, 2013) who presents to the Emergency Department complaining of chest pain that radiates to both right and left sides. He reports that he has experienced frequent pain after having a defibrillator put in, but previous pain has always resolved quickly on its own. Today's pain did not resolve quickly, which concerned him: this pain began after physical activity around 17:30 today (30-17min after work out). He describes it as a "burning" pain with nausea. He denies SOB, fevers, chills, coughing, leg swelling.  Patient reports that he was told he had had prior heart attacks during his cardiac care in 2013; he only experienced SOB with those. His current symptoms do not remind him of those experiences. His defibrillator has not fired since its implantation (July, 2013).   Patient states that he works out approx 3x per week; stair machine, stationary bike, lifting weights. Physical activity does not bother him.   Past Medical History  Diagnosis Date  . Hypertension   . Hyperlipidemia   . Heart murmur   . Shortness of breath     "lying down & w/exertion"  . Diabetes mellitus   . GERD (gastroesophageal reflux disease)   . Arthritis   . Chronic systolic heart failure   . Mitral regurgitation     S/P mitral valve repair [[id number]][  . Ischemic cardiomyopathy     S/P CABG x 3; EF 25%  . Carotid artery disease     60-79% LICA  . Allergy to ACE inhibitors      Angioedema  . Renal insufficiency   . Coronary artery disease   . Myocardial infarction   . Dysrhythmia     heart block  . ICD (implantable cardiac defibrillator) in place 03/28/2012  . CHF (congestive heart failure)    Past Surgical History  Procedure Laterality Date  . Knee arthroscopy  ~ 2008    right  . Nasal sinus surgery  1990's    right  . Mitral valve repair  10/17/2011    Procedure: MITRAL VALVE REPAIR (MVR);  Surgeon: Purcell Nails, MD;  Location: [facility] OR;  Service: Open Heart Surgery;  Laterality: N/A;  . Coronary artery bypass graft  10/17/2011    Procedure: CORONARY ARTERY BYPASS GRAFTING (CABG);  Surgeon: Purcell Nails, MD;  Location: [facility] OR;  Service: Open Heart Surgery;  Laterality: N/A;  . Icd  03/28/2012  . Colonoscopy N/A 04/24/2014    RMR Pancolonic diverticulosis  . Esophagogastroduodenoscopy N/A 04/24/2014    RMR Subtle nodularity the gastric mucosa of uncertain significance-status post biopsy. Hiatal hernia. chronic inflammation, no H.pylori  . Left and right heart catheterization with coronary angiogram N/A 10/12/2011    Procedure: LEFT AND RIGHT HEART CATHETERIZATION WITH CORONARY ANGIOGRAM;  Surgeon: Iran Ouch, MD;  Location: [facility] CATH LAB;  Service: Cardiovascular;  Laterality: N/A;  . Implantable cardioverter defibrillator implant N/A 03/28/2012  Procedure: IMPLANTABLE CARDIOVERTER DEFIBRILLATOR IMPLANT;  Surgeon: Marinus Maw, MD;  Location: [facility] CATH LAB;  Service: Cardiovascular;  Laterality: N/A;   Family History  Problem Relation Age of Onset  . Heart disease Mother    History  Substance Use Topics  . Smoking status: Never Smoker   . Smokeless tobacco: Never Used     Comment: Never smoked  . Alcohol Use: No    Review of Systems  All other systems reviewed and are negative.   Allergies  Ace inhibitors and Penicillins  Home Medications   Prior to Admission medications   Medication Sig Start Date End Date Taking? Authorizing Provider   aspirin EC 81 MG tablet Take 1 tablet (81 mg total) by mouth daily. 01/03/13  Yes Rande Brunt, PA-C  CALCIUM-MAGNESIUM-ZINC PO Take 2 tablets by mouth daily.   Yes Historical Provider, MD  carvedilol (COREG) 25 MG tablet Take 25 mg in am and 12.5 mg pm Patient taking differently: Take 12.5 mg by mouth 2 (two) times daily with a meal. 1/2 tab twice daily 06/06/14  Yes Marinus Maw, MD  furosemide (LASIX) 40 MG tablet Take 1 tablet (40 mg total) by mouth daily. Takes extra 1/2 when needed Patient taking differently: Take 60 mg by mouth daily.  02/06/14 02/08/15 Yes Lonia Blood, MD  glipiZIDE (GLUCOTROL XL) 10 MG 24 hr tablet Take 10 mg by mouth 2 times daily at 12 noon and 4 pm. Pt takes it in the AM and PM   Yes Historical Provider, MD  Multiple Vitamins-Minerals (MULTIVITAMINS THER. W/MINERALS) TABS Take 1 tablet by mouth daily.   Yes Historical Provider, MD  Omega-3 Fatty Acids (FISH OIL) 1000 MG CAPS Take 1 capsule by mouth 2 (two) times daily.   Yes Historical Provider, MD  omeprazole (PRILOSEC) 20 MG capsule Take 20 mg by mouth daily.   Yes Historical Provider, MD  rosuvastatin (CRESTOR) 20 MG tablet Take 20 mg by mouth every evening.   Yes Historical Provider, MD  testosterone cypionate (DEPO-TESTOSTERONE) 200 MG/ML injection Inject into the muscle every 14 (fourteen) days. INJECT 0.6ML- 0.8ML EVERY 2 WEEKS 08/13/14  Yes Historical Provider, MD   BP 145/76 mmHg  Pulse 27  Temp(Src) 97.5 F (36.4 C) (Oral)  Resp 21  Ht  (1.956 m)  Wt 240 lb (108.863 kg)  BMI 28.45 kg/m2  SpO2 96% Physical Exam  Constitutional: He appears well-developed and well-nourished.  HENT:  Head: Normocephalic and atraumatic.  Eyes: Conjunctivae are normal. Right eye exhibits no discharge. Left eye exhibits no discharge.  Cardiovascular:  Frequent ectopy  Pulmonary/Chest: Effort normal. No respiratory distress.  No reproducible TTP  Abdominal: Soft. Bowel sounds are normal. There is no  tenderness. There is no rebound and no guarding.  Neurological: He is alert. Coordination normal.  Skin: Skin is warm and dry. No rash noted. He is not diaphoretic. No erythema.  Psychiatric: He has a normal mood and affect.  Nursing note and vitals reviewed.   ED Course  Procedures   DIAGNOSTIC STUDIES: Oxygen Saturation is 99% on RA, normal by my interpretation.    COORDINATION OF CARE: 9:28 PM Discussed treatment plan with pt at bedside and pt agreed to plan.   Labs Review Labs Reviewed  BASIC METABOLIC PANEL - Abnormal; Notable for the following:    Sodium 134 (*)    Glucose, Bld 223 (*)    BUN 24 (*)    Creatinine, Ser 1.74 (*)    GFR calc  non Af Amer 36 (*)    GFR calc Af Amer 41 (*)    All other components within normal limits  CBC - Abnormal; Notable for the following:    WBC 12.1 (*)    Hemoglobin 18.0 (*)    HCT 53.2 (*)    All other components within normal limits  HEPATIC FUNCTION PANEL - Abnormal; Notable for the following:    Total Bilirubin 1.3 (*)    Indirect Bilirubin 1.0 (*)    All other components within normal limits  TROPONIN I  PROTIME-INR  LIPASE, BLOOD    Imaging Review Dg Chest Port 1 View  09/01/2014   CLINICAL DATA:  Acute onset of epigastric abdominal pain, radiating under the breasts. Initial encounter.  EXAM: PORTABLE CHEST - 1 VIEW  COMPARISON:  Chest radiograph performed 02/03/2014  FINDINGS: There is persistent elevation of the right hemidiaphragm. The left lung base is incompletely imaged on this study. Vascular congestion is noted, with mildly increased interstitial markings, possibly reflecting minimal interstitial edema. No definite pleural effusion or pneumothorax is seen.  The cardiomediastinal silhouette is borderline enlarged. An AICD is noted overlying the left chest wall, with a single lead ending overlying the right ventricle. The patient is status post median sternotomy, with evidence of prior CABG. No acute osseous abnormalities  are seen.  IMPRESSION: Persistent elevation of the right hemidiaphragm. Vascular congestion noted, with mildly increased interstitial markings, raising question for minimal interstitial edema. Borderline cardiomegaly noted.   Electronically Signed   By: Roanna Raider M.D.   On: 09/01/2014 21:46     EKG Interpretation   Date/Time:  Monday September 01 2014 21:27:49 EST Ventricular Rate:  88 PR Interval:  303 QRS Duration: 142 QT Interval:  403 QTC Calculation: 488 R Axis:   -36 Text Interpretation:  Sinus rhythm Ventricular bigeminy Prolonged PR  interval Nonspecific IVCD with LAD Borderline T abnormalities, lateral  leads Abnormal ekg since last tracing no significant change Confirmed by  Makaley Storts  MD, Teria Khachatryan ([id number]) on 09/01/2014 10:52:56 PM      MDM   Final diagnoses:  Chest pain, unspecified chest pain type   Pt denies infectious sx such as fevers, coughs and he has no swelling of his legs.  He has a known hx of CAD and has had a bypass as well as a defibrillator and valve repair.  He does not appear acutely ill however he has been having regular episodes of short lived CP and today this lasted hours (much longer than usual).  His ECG is abnormal in that there is ectopy and non specific T waves - no other acute findings to suggest an MI - however with the patients history and risk factors - would be beneficial to have a r/o.  ASA given.  Pt pain free soon after arrival.  Labs nor x-ray shows significant abnormal findings, patient will be admitted to the hospitalist service, Dr. Selena Batten consult and is agreeable to admit.  I personally performed the services described in this documentation, which was scribed in my presence. The recorded information has been reviewed and is accurate.    Vida Roller, MD 09/01/14 2312

## 2014-09-01 NOTE — Progress Notes (Signed)
Remote ICD transmission.   

## 2014-09-02 ENCOUNTER — Encounter (HOSPITAL_COMMUNITY): Payer: Self-pay | Admitting: Internal Medicine

## 2014-09-02 DIAGNOSIS — I5032 Chronic diastolic (congestive) heart failure: Secondary | ICD-10-CM

## 2014-09-02 DIAGNOSIS — E785 Hyperlipidemia, unspecified: Secondary | ICD-10-CM

## 2014-09-02 DIAGNOSIS — R1013 Epigastric pain: Secondary | ICD-10-CM

## 2014-09-02 DIAGNOSIS — I10 Essential (primary) hypertension: Secondary | ICD-10-CM

## 2014-09-02 LAB — GLUCOSE, CAPILLARY
GLUCOSE-CAPILLARY: 127 mg/dL — AB (ref 70–99)
GLUCOSE-CAPILLARY: 232 mg/dL — AB (ref 70–99)
Glucose-Capillary: 146 mg/dL — ABNORMAL HIGH (ref 70–99)
Glucose-Capillary: 124 mg/dL — ABNORMAL HIGH (ref 70–99)

## 2014-09-02 LAB — COMPREHENSIVE METABOLIC PANEL
ALBUMIN: 3.6 g/dL (ref 3.5–5.2)
ALT: 30 U/L (ref 0–53)
AST: 19 U/L (ref 0–37)
Alkaline Phosphatase: 37 U/L — ABNORMAL LOW (ref 39–117)
Anion gap: 7 (ref 5–15)
BILIRUBIN TOTAL: 1.4 mg/dL — AB (ref 0.3–1.2)
BUN: 22 mg/dL (ref 6–23)
CO2: 31 mmol/L (ref 19–32)
CREATININE: 1.68 mg/dL — AB (ref 0.50–1.35)
Calcium: 8.6 mg/dL (ref 8.4–10.5)
Chloride: 98 mEq/L (ref 96–112)
GFR calc Af Amer: 43 mL/min — ABNORMAL LOW (ref >90)
GFR calc non Af Amer: 37 mL/min — ABNORMAL LOW (ref >90)
Glucose, Bld: 145 mg/dL — ABNORMAL HIGH (ref 70–99)
Potassium: 3.6 mmol/L (ref 3.5–5.1)
SODIUM: 136 mmol/L (ref 135–145)
Total Protein: 6.2 g/dL (ref 6.0–8.3)

## 2014-09-02 LAB — CBC
HCT: 48.3 % (ref 39.0–52.0)
Hemoglobin: 15.8 g/dL (ref 13.0–17.0)
MCH: 31 pg (ref 26.0–34.0)
MCHC: 32.7 g/dL (ref 30.0–36.0)
MCV: 94.9 fL (ref 78.0–100.0)
PLATELETS: 156 10*3/uL (ref 150–400)
RBC: 5.09 MIL/uL (ref 4.22–5.81)
RDW: 14.7 % (ref 11.5–15.5)
WBC: 10.2 10*3/uL (ref 4.0–10.5)

## 2014-09-02 LAB — TROPONIN I: Troponin I: <0.03 ng/mL (ref <0.031)

## 2014-09-02 LAB — LIPID PANEL
CHOL/HDL RATIO: 4.5 ratio
CHOLESTEROL: 130 mg/dL (ref 0–200)
HDL: 29 mg/dL — ABNORMAL LOW (ref >39)
LDL Cholesterol: 79 mg/dL (ref 0–99)
Triglycerides: 110 mg/dL (ref <150)
VLDL: 22 mg/dL (ref 0–40)

## 2014-09-02 LAB — HEMOGLOBIN A1C
Hgb A1c MFr Bld: 7.9 % — ABNORMAL HIGH (ref <5.7)
Mean Plasma Glucose: 180 mg/dL — ABNORMAL HIGH (ref <117)

## 2014-09-02 LAB — BRAIN NATRIURETIC PEPTIDE: B NATRIURETIC PEPTIDE 5: 435 pg/mL — AB (ref 0.0–100.0)

## 2014-09-02 MED ORDER — FUROSEMIDE 20 MG PO TABS
60.0000 mg | ORAL_TABLET | Freq: Every day | ORAL | Status: DC
  Administered 2014-09-02: 60 mg via ORAL
  Filled 2014-09-02 (×2): qty 1

## 2014-09-02 MED ORDER — CARVEDILOL 12.5 MG PO TABS
12.5000 mg | ORAL_TABLET | Freq: Two times a day (BID) | ORAL | Status: DC
  Administered 2014-09-02: 12.5 mg via ORAL
  Filled 2014-09-02: qty 1

## 2014-09-02 MED ORDER — SODIUM CHLORIDE 0.9 % IJ SOLN
3.0000 mL | Freq: Two times a day (BID) | INTRAMUSCULAR | Status: DC
  Administered 2014-09-02: 3 mL via INTRAVENOUS

## 2014-09-02 MED ORDER — SODIUM CHLORIDE 0.9 % IV SOLN: INTRAVENOUS | Status: DC

## 2014-09-02 MED ORDER — SODIUM CHLORIDE 0.9 % IV SOLN: 250.0000 mL | INTRAVENOUS | Status: DC | PRN

## 2014-09-02 MED ORDER — ADULT MULTIVITAMIN W/MINERALS CH
1.0000 | ORAL_TABLET | Freq: Every day | ORAL | Status: DC
  Administered 2014-09-02: 1 via ORAL
  Filled 2014-09-02: qty 1

## 2014-09-02 MED ORDER — ASPIRIN EC 81 MG PO TBEC
81.0000 mg | DELAYED_RELEASE_TABLET | Freq: Every day | ORAL | Status: DC
  Filled 2014-09-02: qty 1

## 2014-09-02 MED ORDER — ONDANSETRON HCL 4 MG/2ML IJ SOLN: 4.0000 mg | Freq: Three times a day (TID) | INTRAMUSCULAR | Status: AC | PRN

## 2014-09-02 MED ORDER — ENOXAPARIN SODIUM 30 MG/0.3ML ~~LOC~~ SOLN
30.0000 mg | SUBCUTANEOUS | Status: DC
  Filled 2014-09-02: qty 0.3

## 2014-09-02 MED ORDER — GLIPIZIDE ER 5 MG PO TB24: 10.0000 mg | ORAL_TABLET | Freq: Two times a day (BID) | ORAL | Status: DC

## 2014-09-02 MED ORDER — ACETAMINOPHEN 650 MG RE SUPP: 650.0000 mg | Freq: Four times a day (QID) | RECTAL | Status: DC | PRN

## 2014-09-02 MED ORDER — INSULIN ASPART 100 UNIT/ML ~~LOC~~ SOLN
0.0000 [IU] | Freq: Three times a day (TID) | SUBCUTANEOUS | Status: DC
  Administered 2014-09-02 (×2): 1 [IU] via SUBCUTANEOUS

## 2014-09-02 MED ORDER — PANTOPRAZOLE SODIUM 40 MG PO TBEC
40.0000 mg | DELAYED_RELEASE_TABLET | Freq: Every day | ORAL | Status: DC
  Administered 2014-09-02: 40 mg via ORAL
  Filled 2014-09-02: qty 1

## 2014-09-02 MED ORDER — ROSUVASTATIN CALCIUM 20 MG PO TABS: 20.0000 mg | ORAL_TABLET | Freq: Every evening | ORAL | Status: DC

## 2014-09-02 MED ORDER — SODIUM CHLORIDE 0.9 % IJ SOLN: 3.0000 mL | INTRAMUSCULAR | Status: DC | PRN

## 2014-09-02 MED ORDER — ACETAMINOPHEN 325 MG PO TABS: 650.0000 mg | ORAL_TABLET | Freq: Four times a day (QID) | ORAL | Status: DC | PRN

## 2014-09-02 MED ORDER — OMEGA-3-ACID ETHYL ESTERS 1 G PO CAPS
1.0000 g | ORAL_CAPSULE | Freq: Every day | ORAL | Status: DC
  Administered 2014-09-02: 1 g via ORAL
  Filled 2014-09-02: qty 1

## 2014-09-02 MED ORDER — ENOXAPARIN SODIUM 40 MG/0.4ML ~~LOC~~ SOLN: 40.0000 mg | SUBCUTANEOUS | Status: DC

## 2014-09-02 NOTE — Care Management Note (Signed)
    Page 1 of 1   09/02/2014     2:18:44 PM CARE MANAGEMENT NOTE 09/02/2014  Patient:  Wayne Gordon, Wayne Gordon   Account Number:  [id number]  Date Initiated:  09/02/2014  Documentation initiated by:  Kathyrn Sheriff  Subjective/Objective Assessment:   Pt admitted with chest pain. Pt is from home with wife. Pt independent at baseline. Pt has no HH services, DME's or med needs prior to admission.     Action/Plan:   Pt plans to discharge home with self care. Pt has no CM needs a this time.   Anticipated DC Date:  09/02/2014   Anticipated DC Plan:  HOME/SELF CARE      DC Planning Services  CM consult      Choice offered to / List presented to:             Status of service:  Completed, signed off Medicare Important Message given?   (If response is "NO", the following Medicare IM given date fields will be blank) Date Medicare IM given:   Medicare IM given by:   Date Additional Medicare IM given:   Additional Medicare IM given by:    Discharge Disposition:  HOME/SELF CARE  Per UR Regulation:    If discussed at Long Length of Stay Meetings, dates discussed:    Comments:  09/02/2014 1400 Kathyrn Sheriff, RN, MSN, [facility]

## 2014-09-02 NOTE — Care Management Utilization Note (Signed)
UR completed 

## 2014-09-02 NOTE — Progress Notes (Signed)
Patient discharged with instructions given on medications,and follow up visits,patient verbalized understanding.No c/o pain or discomfort noted.Accompanied by staff to awaiting vehicle. 

## 2014-09-02 NOTE — Progress Notes (Signed)
Inpatient Diabetes Program Recommendations  AACE/ADA: New Consensus Statement on Inpatient Glycemic Control (2013)  Target Ranges:  Prepandial:   less than 140 mg/dL      Peak postprandial:   less than 180 mg/dL (1-2 hours)      Critically ill patients:  140 - 180 mg/dL   Results for Wayne Gordon, Wayne Gordon (MRN [id number]) as of 09/02/2014 09:42  Ref. Range 09/02/2014 00:30 09/02/2014 04:18 09/02/2014 07:15  Glucose-Capillary Latest Range: 70-99 mg/dL 010 (H) 071 (H) 219 (H)   Diabetes history: DM2 Outpatient Diabetes medications: Glipizide 10 mg BID Current orders for Inpatient glycemic control: Novolog 0-9 units TID with meals  Inpatient Diabetes Program Recommendations Correction (SSI): While NPO, please consider changing frequency from TID with meals to Q4H. HgbA1C: Last A1C 6.2% on 10/13/11. Please consider ordering an A1C to evaluate glycemic control over the past 2-3 months.  Thanks, Orlando Penner, RN, MSN, CCRN, CDE Diabetes Coordinator Inpatient Diabetes Program [phone] (Team Pager) [phone] ([facility] office) [phone] [facility] office)

## 2014-09-02 NOTE — Discharge Summary (Signed)
Discharge Summary  Wayne Gordon ZOX:[id number] DOB: October 31, 1933  PCP: Samuel Jester, DO  Admit date: 09/01/2014 Discharge date: 09/02/2014  Time spent: 25 minutes  Recommendations for Outpatient Follow-up:  1. Patient will follow-up with his primary care physician, Dr. Charm Barges in the next month   Discharge Diagnoses:  Active Hospital Problems   Diagnosis Date Noted  . Chest pain 09/01/2014  . HLD (hyperlipidemia) 01/03/2013  . HTN (hypertension) 11/28/2011  . Renal insufficiency 10/31/2011    Resolved Hospital Problems   Diagnosis Date Noted Date Resolved  No resolved problems to display.    Discharge Condition: Improved, being discharged home  Diet recommendation: Heart healthy  Filed Weights   09/01/14 2117 09/02/14 0027 09/02/14 0506  Weight: 108.863 kg (240 lb) 109.453 kg (241 lb 4.8 oz) 109.181 kg (240 lb 11.2 oz)    History of present illness:  Patient is a 81 year old male with past mental history of chronic kidney disease stage III and CAD status post CABG who starting yesterday, 1/4, afternoon had mid epigastric pain radiating to both sides. This also caused some nausea. No diarrhea or fever. No shortness of breath. Patient has some chronic left-sided chest pain following his CABG which he was told was secondary to a nerve being cut at time of surgery. This pain was in a different location and different from that. Patient became concerned and came to the emergency room for further evaluation. Lab work was done which was essentially unrevealing including normal lipase. Hospitals were called for further evaluation  Hospital Course:  Active Problems:   Renal insufficiency   HTN (hypertension): Stable continue on home medications. Blood pressure remained under good control during this hospitalization.   HLD (hyperlipidemia):: Stable   Chest pain: Chronic and more musculoskeletal secondary to postop from surgery. Unrelated to current issue.  Overweight: Patient meets  criteria with BMI greater than 25   chronic diastolic heart failure: Patient already on Lasix and beta blocker. No ACE inhibitor secondary to renal dysfunction. BNP checked and found to be up at 475, however he looks comfortable and with normal oxygen saturations, this is likely elevated in the setting of his renal failure  Abdominal pain-midepigastric: Principal problem.  No signs of any blockage or obstruction. Patient moving bowels. Labs normal. Diet restarted and patient tolerating solid food. Felt to be medically stable to go home  Procedures:  None  Consultations:  None  Discharge Exam: BP 133/85 mmHg  Pulse 84  Temp(Src) 98.1 F (36.7 C) (Other (Comment))  Resp 20  Ht  (1.956 m)  Wt 109.181 kg (240 lb 11.2 oz)  BMI 28.54 kg/m2  SpO2 96%  General: Alert and oriented 3, no acute distress Cardiovascular: Regular rate and rhythm, S1-S2, 2/6 systolic ejection murmur Respiratory: Clear to auscultation bilaterally Abdomen: Soft, nontender, distended, positive bowel sounds  Discharge Instructions You were cared for by a hospitalist during your hospital stay. If you have any questions about your discharge medications or the care you received while you were in the hospital after you are discharged, you can call the unit and asked to speak with the hospitalist on call if the hospitalist that took care of you is not available. Once you are discharged, your primary care physician will handle any further medical issues. Please note that NO REFILLS for any discharge medications will be authorized once you are discharged, as it is imperative that you return to your primary care physician (or establish a relationship with a primary care physician if you  do not have one) for your aftercare needs so that they can reassess your need for medications and monitor your lab values.     Medication List    TAKE these medications        aspirin EC 81 MG tablet  Take 1 tablet (81 mg total) by  mouth daily.     CALCIUM-MAGNESIUM-ZINC PO  Take 2 tablets by mouth daily.     carvedilol 25 MG tablet  Commonly known as:  COREG  Take 25 mg in am and 12.5 mg pm     DEPO-TESTOSTERONE 200 MG/ML injection  Generic drug:  testosterone cypionate  Inject into the muscle every 14 (fourteen) days. INJECT 0.6ML- 0.8ML EVERY 2 WEEKS     Fish Oil 1000 MG Caps  Take 1 capsule by mouth 2 (two) times daily.     furosemide 40 MG tablet  Commonly known as:  LASIX  Take 1 tablet (40 mg total) by mouth daily. Takes extra 1/2 when needed     glipiZIDE 10 MG 24 hr tablet  Commonly known as:  GLUCOTROL XL  Take 10 mg by mouth 2 times daily at 12 noon and 4 pm. Pt takes it in the AM and PM     multivitamins ther. w/minerals Tabs tablet  Take 1 tablet by mouth daily.     omeprazole 20 MG capsule  Commonly known as:  PRILOSEC  Take 20 mg by mouth daily.     rosuvastatin 20 MG tablet  Commonly known as:  CRESTOR  Take 20 mg by mouth every evening.       Allergies  Allergen Reactions  . Ace Inhibitors Other (See Comments)    Kidney issues  . Penicillins Palpitations      The results of significant diagnostics from this hospitalization (including imaging, microbiology, ancillary and laboratory) are listed below for reference.    Significant Diagnostic Studies: Dg Chest Port 1 View  09/01/2014   CLINICAL DATA:  Acute onset of epigastric abdominal pain, radiating under the breasts. Initial encounter.  EXAM: PORTABLE CHEST - 1 VIEW  COMPARISON:  Chest radiograph performed 02/03/2014  FINDINGS: There is persistent elevation of the right hemidiaphragm. The left lung base is incompletely imaged on this study. Vascular congestion is noted, with mildly increased interstitial markings, possibly reflecting minimal interstitial edema. No definite pleural effusion or pneumothorax is seen.  The cardiomediastinal silhouette is borderline enlarged. An AICD is noted overlying the left chest wall, with a  single lead ending overlying the right ventricle. The patient is status post median sternotomy, with evidence of prior CABG. No acute osseous abnormalities are seen.  IMPRESSION: Persistent elevation of the right hemidiaphragm. Vascular congestion noted, with mildly increased interstitial markings, raising question for minimal interstitial edema. Borderline cardiomegaly noted.   Electronically Signed   By: Roanna Raider M.D.   On: 09/01/2014 21:46    Microbiology: No results found for this or any previous visit (from the past 240 hour(s)).   Labs: Basic Metabolic Panel:  Recent Labs Lab 09/01/14 2134 09/02/14 0614  NA 134* 136  K 3.8 3.6  CL 98 98  CO2 31 31  GLUCOSE 223* 145*  BUN 24* 22  CREATININE 1.74* 1.68*  CALCIUM 9.2 8.6   Liver Function Tests:  Recent Labs Lab 09/01/14 2134 09/02/14 0614  AST 27 19  ALT 36 30  ALKPHOS 48 37*  BILITOT 1.3* 1.4*  PROT 7.3 6.2  ALBUMIN 4.4 3.6    Recent Labs Lab 09/01/14 2134  LIPASE 36   No results for input(s): AMMONIA in the last 168 hours. CBC:  Recent Labs Lab 09/01/14 2134 09/02/14 0822  WBC 12.1* 10.2  HGB 18.0* 15.8  HCT 53.2* 48.3  MCV 95.0 94.9  PLT 159 156   Cardiac Enzymes:  Recent Labs Lab 09/01/14 2134 09/02/14 0051 09/02/14 0614  TROPONINI <0.03 <0.03 <0.03   BNP: BNP (last 3 results)  Recent Labs  02/03/14 1555  PROBNP 2112.0*   CBG:  Recent Labs Lab 09/02/14 0030 09/02/14 0418 09/02/14 0715 09/02/14 1118  GLUCAP 232* 146* 127* 124*       Signed:  Iara Monds K  [facility] 09/02/2014, 1:24 PM

## 2014-09-02 NOTE — Consult Note (Signed)
Error.Consult cancelled.

## 2014-09-22 ENCOUNTER — Ambulatory Visit (INDEPENDENT_AMBULATORY_CARE_PROVIDER_SITE_OTHER): Payer: Medicare HMO | Admitting: Internal Medicine

## 2014-09-22 ENCOUNTER — Encounter: Payer: Self-pay | Admitting: Internal Medicine

## 2014-09-22 VITALS — BP 130/96 | HR 43 | Ht 76.0 in | Wt 243.0 lb

## 2014-09-22 DIAGNOSIS — I214 Non-ST elevation (NSTEMI) myocardial infarction: Secondary | ICD-10-CM

## 2014-09-22 DIAGNOSIS — I255 Ischemic cardiomyopathy: Secondary | ICD-10-CM

## 2014-09-22 DIAGNOSIS — I10 Essential (primary) hypertension: Secondary | ICD-10-CM

## 2014-09-22 DIAGNOSIS — I5022 Chronic systolic (congestive) heart failure: Secondary | ICD-10-CM

## 2014-09-22 DIAGNOSIS — Z95810 Presence of automatic (implantable) cardiac defibrillator: Secondary | ICD-10-CM

## 2014-09-22 LAB — MDC_IDC_ENUM_SESS_TYPE_INCLINIC
HighPow Impedance: 91 Ohm
Lead Channel Impedance Value: 607 Ohm
Lead Channel Pacing Threshold Pulse Width: 0.4 ms
Lead Channel Sensing Intrinsic Amplitude: 23.1 mV
Lead Channel Sensing Intrinsic Amplitude: 7.3 mV
Lead Channel Setting Pacing Pulse Width: 0.4 ms
Lead Channel Setting Sensing Sensitivity: 0.8 mV
MDC IDC MSMT BATTERY VOLTAGE: 3.12 V
MDC IDC MSMT LEADCHNL RV PACING THRESHOLD AMPLITUDE: 0.6 V
MDC IDC PG SERIAL: 60687186
MDC IDC SESS DTM: 20160125130133
MDC IDC STAT BRADY RV PERCENT PACED: 22 %
Zone Setting Detection Interval: 310 ms

## 2014-09-22 MED ORDER — HYDRALAZINE HCL 25 MG PO TABS: 25.0000 mg | ORAL_TABLET | Freq: Three times a day (TID) | ORAL | Status: DC

## 2014-09-22 NOTE — Progress Notes (Signed)
HPI Wayne Gordon returns today for followup. He is a very pleasant 81 year old man with an ischemic cardiomyopathy, chronic well compensated systolic heart failure, mild renal insufficiency, stage II, diabetes, and hypertension. He has done well in the interim, status post ICD implantation. No ICD shocks. No syncope, and no peripheral edema. He does note some increased blood pressures.  He has not had palpitations.  He denies syncope or ICD shock. Allergies  Allergen Reactions  . Ace Inhibitors Other (See Comments)    Kidney issues  . Penicillins Palpitations     Current Outpatient Prescriptions  Medication Sig Dispense Refill  . aspirin EC 81 MG tablet Take 1 tablet (81 mg total) by mouth daily.    [name] Kitchen CALCIUM-MAGNESIUM-ZINC PO Take 2 tablets by mouth daily.    . carvedilol (COREG) 25 MG tablet Take 25 mg in am and 12.5 mg pm (Patient taking differently: Take 12.5 mg by mouth 2 (two) times daily with a meal. 1/2 tab twice daily) 135 tablet 6  . furosemide (LASIX) 40 MG tablet Take 1 tablet (40 mg total) by mouth daily. Takes extra 1/2 when needed (Patient taking differently: Take 60 mg by mouth daily. ) 45 tablet 11  . glipiZIDE (GLUCOTROL XL) 10 MG 24 hr tablet Take 10 mg by mouth 2 times daily at 12 noon and 4 pm. Pt takes it in the AM and PM    . Multiple Vitamins-Minerals (MULTIVITAMINS THER. W/MINERALS) TABS Take 1 tablet by mouth daily.    . Omega-3 Fatty Acids (FISH OIL) 1000 MG CAPS Take 1 capsule by mouth 2 (two) times daily.    [name] Kitchen omeprazole (PRILOSEC) 20 MG capsule Take 20 mg by mouth daily.    . rosuvastatin (CRESTOR) 20 MG tablet Take 20 mg by mouth every evening.    . testosterone cypionate (DEPO-TESTOSTERONE) 200 MG/ML injection Inject into the muscle every 14 (fourteen) days. INJECT 0.6ML- 0.8ML EVERY 2 WEEKS     No current facility-administered medications for this visit.     Past Medical History  Diagnosis Date  . Hypertension   . Hyperlipidemia   . Heart murmur   .  Shortness of breath     "lying down & w/exertion"  . Diabetes mellitus   . GERD (gastroesophageal reflux disease)   . Arthritis   . Chronic systolic heart failure   . Mitral regurgitation     S/P mitral valve repair [[id number]][  . Ischemic cardiomyopathy     S/P CABG x 3; EF 25%  . Carotid artery disease     60-79% LICA  . Allergy to ACE inhibitors     Angioedema  . Renal insufficiency   . Coronary artery disease   . Myocardial infarction   . Dysrhythmia     heart block  . ICD (implantable cardiac defibrillator) in place 03/28/2012  . CHF (congestive heart failure)     ROS:   All systems reviewed and negative except as noted in the HPI.   Past Surgical History  Procedure Laterality Date  . Knee arthroscopy  ~ 2008    right  . Nasal sinus surgery  1990's    right  . Mitral valve repair  10/17/2011    Procedure: MITRAL VALVE REPAIR (MVR);  Surgeon: Purcell Nails, MD;  Location: [facility] OR;  Service: Open Heart Surgery;  Laterality: N/A;  . Coronary artery bypass graft  10/17/2011    Procedure: CORONARY ARTERY BYPASS GRAFTING (CABG);  Surgeon: Purcell Nails, MD;  Location: [facility] OR;  Service: Open Heart Surgery;  Laterality: N/A;  . Icd  03/28/2012  . Colonoscopy N/A 04/24/2014    RMR Pancolonic diverticulosis  . Esophagogastroduodenoscopy N/A 04/24/2014    RMR Subtle nodularity the gastric mucosa of uncertain significance-status post biopsy. Hiatal hernia. chronic inflammation, no H.pylori  . Left and right heart catheterization with coronary angiogram N/A 10/12/2011    Procedure: LEFT AND RIGHT HEART CATHETERIZATION WITH CORONARY ANGIOGRAM;  Surgeon: Iran Ouch, MD;  Location: [facility] CATH LAB;  Service: Cardiovascular;  Laterality: N/A;  . Implantable cardioverter defibrillator implant N/A 03/28/2012    Procedure: IMPLANTABLE CARDIOVERTER DEFIBRILLATOR IMPLANT;  Surgeon: Marinus Maw, MD;  Location: [facility] C[facility] LAB;  Service: Cardiovascular;  Laterality: N/A;     Family History   Problem Relation Age of Onset  . Heart disease Mother   . Diabetes Father   . Cardiomyopathy Father      History   Social History  . Marital Status: Married    Spouse Name: N/A    Number of Children: N/A  . Years of Education: N/A   Occupational History  . Retired    Social History Main Topics  . Smoking status: Never Smoker   . Smokeless tobacco: Never Used     Comment: Never smoked  . Alcohol Use: No  . Drug Use: No  . Sexual Activity: Yes   Other Topics Concern  . Not on file   Social History Narrative     BP 130/96 mmHg  Pulse 43  Ht  (1.93 m)  Wt 243 lb (110.224 kg)  BMI 29.59 kg/m2  SpO2 97%  Physical Exam:  Well appearing 81 year old man, NAD HEENT: Unremarkable Neck:  6 cm JVD, no thyromegally Lungs:  Clear with no wheezes, rales, or rhonchi. HEART:  IRegular rate rhythm, no murmurs, no rubs, no clicks Abd:  soft, positive bowel sounds, no organomegally, no rebound, no guarding Ext:  2 plus pulses, no edema, no cyanosis, no clubbing Skin:  No rashes no nodules Neuro:  CN II through XII intact, motor grossly intact  DEVICE  Normal device function.  See PaceArt for details.   Assess/Plan:

## 2014-09-22 NOTE — Patient Instructions (Addendum)
Your physician wants you to follow-up in: 6 months Dr. Ladona Ridgel. You will receive a reminder letter in the mail two months in advance. If you don't receive a letter, please call our office to schedule the follow-up appointment.   Start Hydralazine 25 mg three times daily   Thank you for choosing [facility]!

## 2014-09-22 NOTE — Assessment & Plan Note (Signed)
He denies anginal symptoms. Will follow. 

## 2014-09-22 NOTE — Addendum Note (Signed)
Addended by: Kerney Elbe on: 09/22/2014 12:29 PM   Modules accepted: Medications

## 2014-09-22 NOTE — Assessment & Plan Note (Signed)
His symptoms are class 2 and well compensated. I have asked him to start Hydralazine. He had renal failure on Losartan and ACE inhibitors.

## 2014-09-22 NOTE — Assessment & Plan Note (Signed)
His device is working normally. He is pacing a bit more than I would like. Will follow.

## 2014-09-22 NOTE — Assessment & Plan Note (Signed)
His blood pressure remains elevated. He will continue his current meds and add hydralazine.

## 2014-12-22 ENCOUNTER — Ambulatory Visit (INDEPENDENT_AMBULATORY_CARE_PROVIDER_SITE_OTHER): Payer: Medicare HMO | Admitting: *Deleted

## 2014-12-22 DIAGNOSIS — I255 Ischemic cardiomyopathy: Secondary | ICD-10-CM

## 2014-12-22 LAB — MDC_IDC_ENUM_SESS_TYPE_REMOTE
Brady Statistic RV Percent Paced: 16 %
Implantable Pulse Generator Model: 540
Implantable Pulse Generator Serial Number: 60687186
Lead Channel Pacing Threshold Amplitude: 1.6 V
Lead Channel Pacing Threshold Pulse Width: 0.4 ms
Lead Channel Sensing Intrinsic Amplitude: 12.8 mV
Lead Channel Sensing Intrinsic Amplitude: 3.1 mV
Lead Channel Setting Pacing Pulse Width: 0.4 ms
Lead Channel Setting Sensing Sensitivity: 0.8 mV
MDC IDC SET ZONE DETECTION INTERVAL: 310 ms

## 2014-12-22 NOTE — Progress Notes (Signed)
Remote ICD transmission.   

## 2014-12-26 ENCOUNTER — Encounter: Payer: Self-pay | Admitting: Cardiology

## 2015-01-05 ENCOUNTER — Encounter: Payer: Self-pay | Admitting: Internal Medicine

## 2015-01-09 ENCOUNTER — Encounter: Payer: Self-pay | Admitting: Cardiology

## 2015-02-10 ENCOUNTER — Telehealth: Payer: Self-pay

## 2015-02-10 NOTE — Telephone Encounter (Signed)
Pt called and states that he has has some episodes where he has pain his chest with some reflux and after he takes some medication for the GERD it goes away. States it has been happening more and more. Pain was a 12 out 10. States it isn't his heart its the heartburn. Wants to know if he needs to be seen in the office.

## 2015-02-10 NOTE — Telephone Encounter (Signed)
Spoke with pt and he has an appointment on 02/18/2015 @11 :30am

## 2015-02-10 NOTE — Telephone Encounter (Signed)
Talked with Candy. Patient's last episode was two days ago. Happened couple of times last week. Per patient, seen in ER and "wasn't my heart".  Let's offer him an OV. Thanks!

## 2015-02-18 ENCOUNTER — Encounter: Payer: Self-pay | Admitting: Gastroenterology

## 2015-02-18 ENCOUNTER — Ambulatory Visit (INDEPENDENT_AMBULATORY_CARE_PROVIDER_SITE_OTHER): Payer: Medicare HMO | Admitting: Gastroenterology

## 2015-02-18 VITALS — BP 153/77 | HR 61 | Temp 97.7°F | Ht 76.0 in | Wt 244.0 lb

## 2015-02-18 DIAGNOSIS — R1013 Epigastric pain: Secondary | ICD-10-CM | POA: Diagnosis not present

## 2015-02-18 DIAGNOSIS — K219 Gastro-esophageal reflux disease without esophagitis: Secondary | ICD-10-CM | POA: Diagnosis not present

## 2015-02-18 NOTE — Progress Notes (Signed)
Primary Care Physician: Octavio Graves, DO  Primary Gastroenterologist:  Garfield Cornea, MD   Chief Complaint  Patient presents with  . Gastrophageal Reflux    chest pain non cardiac    HPI: Wayne Gordon is a 81 y.o. male here for follow-up of chest discomfort. He was last seen in November 2015. Last year he had moderate volume painless hematochezia requiring hospitalization felt to be diverticular bleed. Received one unit of packed red blood cells. CT of the abdomen and pelvis without contrast showed punctate lucent lesions in the pelvic bones possibly osteopenia but multiple myeloma cannot be excluded. Ileocolonoscopy and August 2015 showed pancolonic diverticula, normal distal TI. EGD showed subtle nodularity of the gastric mucosa, biopsies consistent with chronic inflammation but no H. Pylori.  Patient states he's been on omeprazole 20 mg daily for a long time. Will heartburn generally well controlled. Since January he's had several episodes of epigastric pain which radiates bilaterally into the upper abdomen/lower chest. Symptoms are associated with nausea but no vomiting. Usually occurring in the mid to late afternoon. Takes another omeprazole and the pain goes away. Symptoms generally last for several hours at a time and are not necessarily associated with food. With the last episode a couple of weeks ago his urine was orange for couple of days afterwards.  Went to the ER in January with the first episode. By the time he got there the symptoms were gone. He took an omeprazole on the way to the hospital. Chest x-ray showed possible minimal interstitial edema. Borderline cardiomegaly. BNP 435. Troponins negative 3. Hemoglobin normal at 15.8 creatinine elevated 1.68. Total bilirubin 1.4 but alkaline phosphatase, AST, ALT were normal. Lipase normal.  No dysphagia. Some belching. Appetite normal. BM good with metamucil. No blood or black stool.    Current Outpatient Prescriptions    Medication Sig Dispense Refill  . aspirin EC 81 MG tablet Take 1 tablet (81 mg total) by mouth daily.    [name] Kitchen CALCIUM-MAGNESIUM-ZINC PO Take 2 tablets by mouth daily.    . carvedilol (COREG) 25 MG tablet Take 25 mg in am and 12.5 mg pm (Patient taking differently: Take 12.5 mg by mouth 2 (two) times daily with a meal. Take 12.5 mg in am and 12.5 mg pm) 135 tablet 6  . furosemide (LASIX) 40 MG tablet Take 40 mg by mouth. Per pt he is still taking and will take $RemoveB'60mg'DUdTKkFa$  if needed    . glipiZIDE (GLUCOTROL XL) 10 MG 24 hr tablet Take 10 mg by mouth 2 times daily at 12 noon and 4 pm. Pt takes it in the AM and PM    . hydrALAZINE (APRESOLINE) 25 MG tablet Take 1 tablet (25 mg total) by mouth 3 (three) times daily. 270 tablet 3  . Multiple Vitamins-Minerals (MULTIVITAMINS THER. W/MINERALS) TABS Take 1 tablet by mouth daily.    . Omega-3 Fatty Acids (FISH OIL) 1000 MG CAPS Take 1 capsule by mouth 2 (two) times daily.    [name] Kitchen omeprazole (PRILOSEC) 20 MG capsule Take 20 mg by mouth daily.    . rosuvastatin (CRESTOR) 20 MG tablet Take 20 mg by mouth every evening.    . furosemide (LASIX) 40 MG tablet Take 1 tablet (40 mg total) by mouth daily. Takes extra 1/2 when needed (Patient taking differently: Take 60 mg by mouth daily. ) 45 tablet 11  . testosterone cypionate (DEPO-TESTOSTERONE) 200 MG/ML injection Inject into the muscle every 14 (fourteen) days. INJECT 0.6ML- 0.8ML EVERY 2  WEEKS     No current facility-administered medications for this visit.    Allergies as of 02/18/2015 - Review Complete 02/18/2015  Allergen Reaction Noted  . Ace inhibitors Other (See Comments) 02/03/2014  . Penicillins Palpitations 10/10/2011   Past Medical History  Diagnosis Date  . Hypertension   . Hyperlipidemia   . Heart murmur   . Shortness of breath     "lying down & w/exertion"  . Diabetes mellitus   . GERD (gastroesophageal reflux disease)   . Arthritis   . Chronic systolic heart failure   . Mitral regurgitation      S/P mitral valve repair [[id number]][  . Ischemic cardiomyopathy     S/P CABG x 3; EF 25%  . Carotid artery disease     12-19% LICA  . Allergy to ACE inhibitors     Angioedema  . Renal insufficiency   . Coronary artery disease   . Myocardial infarction   . Dysrhythmia     heart block  . ICD (implantable cardiac defibrillator) in place 03/28/2012  . CHF (congestive heart failure)    Past Surgical History  Procedure Laterality Date  . Knee arthroscopy  ~ 2008    right  . Nasal sinus surgery  1990's    right  . Mitral valve repair  10/17/2011    Procedure: MITRAL VALVE REPAIR (MVR);  Surgeon: Rexene Alberts, MD;  Location: [place];  Service: Open Heart Surgery;  Laterality: N/A;  . Coronary artery bypass graft  10/17/2011    Procedure: CORONARY ARTERY BYPASS GRAFTING (CABG);  Surgeon: Rexene Alberts, MD;  Location: [place];  Service: Open Heart Surgery;  Laterality: N/A;  . Icd  03/28/2012  . Colonoscopy N/A 04/24/2014    RMR Pancolonic diverticulosis  . Esophagogastroduodenoscopy N/A 04/24/2014    RMR Subtle nodularity the gastric mucosa of uncertain significance-status post biopsy. Hiatal hernia. chronic inflammation, no H.pylori  . Left and right heart catheterization with coronary angiogram N/A 10/12/2011    Procedure: LEFT AND RIGHT HEART CATHETERIZATION WITH CORONARY ANGIOGRAM;  Surgeon: Wellington Hampshire, MD;  Location: [place] CATH LAB;  Service: Cardiovascular;  Laterality: N/A;  . Implantable cardioverter defibrillator implant N/A 03/28/2012    Procedure: IMPLANTABLE CARDIOVERTER DEFIBRILLATOR IMPLANT;  Surgeon: Evans Lance, MD;  Location: [facility] CATH LAB;  Service: Cardiovascular;  Laterality: N/A;    ROS:  General: Negative for anorexia, weight loss, fever, chills, fatigue, weakness. ENT: Negative for hoarseness, difficulty swallowing , nasal congestion. CV: Negative for chest pain, angina, palpitations, dyspnea on exertion, peripheral edema. See hpi Respiratory: Negative for dyspnea at  rest, dyspnea on exertion, cough, sputum, wheezing.  GI: See history of present illness. GU:  Negative for dysuria, hematuria, urinary incontinence, urinary frequency, nocturnal urination.  Endo: Negative for unusual weight change.    Physical Examination:   BP 153/77 mmHg  Pulse 61  Temp(Src) 97.7 F (36.5 C)  Ht $R'6\' 4"'SS$  (1.93 m)  Wt 244 lb (110.678 kg)  BMI 29.71 kg/m2  General: Well-nourished, well-developed in no acute distress.  Eyes: No icterus. Mouth: Oropharyngeal mucosa moist and pink , no lesions erythema or exudate. Lungs: Clear to auscultation bilaterally.  Heart: Regular rate and rhythm, no murmurs rubs or gallops.  Abdomen: Bowel sounds are normal, nontender, nondistended, no hepatosplenomegaly or masses, no abdominal bruits or hernia , no rebound or guarding.   Extremities: No lower extremity edema. No clubbing or deformities. Neuro: Alert and oriented x 4   Skin: Warm and dry, no  jaundice.   Psych: Alert and cooperative, normal mood and affect.  Labs:  Lab Results  Component Value Date   WBC 10.2 09/02/2014   HGB 15.8 09/02/2014   HCT 48.3 09/02/2014   MCV 94.9 09/02/2014   PLT 156 09/02/2014   Lab Results  Component Value Date   CREATININE 1.68* 09/02/2014   BUN 22 09/02/2014   NA 136 09/02/2014   K 3.6 09/02/2014   CL 98 09/02/2014   CO2 31 09/02/2014   Lab Results  Component Value Date   ALT 30 09/02/2014   AST 19 09/02/2014   ALKPHOS 37* 09/02/2014   BILITOT 1.4* 09/02/2014   Labs from April 2016 Creatinine 1.48, BUN 26, total bilirubin 0.8, alkaline phosphatase 63, AST 34, ALT 50 high, albumen 4.3, white blood cell count 5500, hemoglobin 15.5, platelets 160,000, hemoglobin A1c 7.5, calcium 8.9, total protein 6.6  Imaging Studies: No results found.

## 2015-02-18 NOTE — Patient Instructions (Signed)
1. Increase omeprazole to twice daily before meal. Take second dose around 3pm in the afternoon.  2. If you have another episode of pain, go to get labs done 5-6 hours after episode begins. 3. If you have another episode, would recommend abdominal ultrasound to evaluate your gallbladder.  4. Call with persistent problems or concerns.

## 2015-02-18 NOTE — Progress Notes (Signed)
CC'ED TO PCP 

## 2015-02-18 NOTE — Assessment & Plan Note (Signed)
81 year old gentleman with episodic epigastric discomfort with radiation bilaterally in the upper abdomen/lower chest area. Symptoms lasting for several hours at a time. Usually unrelated to meals. Improves with omeprazole. ER evaluation in January with negative troponins. LFT's normal except for mildly elevated total bilirubin, not differentiated. Patient's gallbladder remains in situ. Denies postprandial abdominal pain and for the most part is typical heartburn is well controlled. Discussed with patient, given improvement of symptoms with omeprazole, treat for breakthrough heartburn. However with the next episode I would like for him to recheck LFTs, lipase 5-6 hours after the onset of symptoms. Offered abdominal ultrasound to evaluate for gallstones, but patient would like to hold off for now. Gallbladder appeared normal on CT in December but 30% of stones may not be seen on CT imaging.   Increase omeprazole to BID, second dose before afternoon snack.  If ongoing symptoms, then plan on abd u/s. Return to the office in six months.

## 2015-02-23 ENCOUNTER — Other Ambulatory Visit: Payer: Self-pay

## 2015-03-23 ENCOUNTER — Encounter: Payer: Medicare HMO | Admitting: *Deleted

## 2015-04-01 ENCOUNTER — Encounter: Payer: Self-pay | Admitting: Cardiology

## 2015-04-01 ENCOUNTER — Ambulatory Visit (INDEPENDENT_AMBULATORY_CARE_PROVIDER_SITE_OTHER): Payer: Medicare HMO | Admitting: *Deleted

## 2015-04-01 DIAGNOSIS — I255 Ischemic cardiomyopathy: Secondary | ICD-10-CM | POA: Diagnosis not present

## 2015-04-01 NOTE — Progress Notes (Signed)
Remote ICD transmission.   

## 2015-04-06 LAB — CUP PACEART REMOTE DEVICE CHECK
Battery Voltage: 3.13 V
Brady Statistic RV Percent Paced: 35 %
Date Time Interrogation Session: 20160808085747
HIGH POWER IMPEDANCE MEASURED VALUE: 84 Ohm
Lead Channel Impedance Value: 578 Ohm
Lead Channel Pacing Threshold Amplitude: 0.6 V
Lead Channel Pacing Threshold Pulse Width: 0.4 ms
Lead Channel Sensing Intrinsic Amplitude: 3.9 mV
Lead Channel Setting Pacing Amplitude: 1.6 V
Lead Channel Setting Pacing Pulse Width: 0.4 ms
Lead Channel Setting Sensing Sensitivity: 0.8 mV
MDC IDC MSMT LEADCHNL RV SENSING INTR AMPL: 14.7 mV
MDC IDC SET ZONE DETECTION INTERVAL: 310 ms
Pulse Gen Serial Number: 60687186

## 2015-04-29 ENCOUNTER — Encounter: Payer: Self-pay | Admitting: Cardiology

## 2015-05-05 ENCOUNTER — Encounter: Payer: Self-pay | Admitting: Internal Medicine

## 2015-05-13 ENCOUNTER — Encounter: Payer: Self-pay | Admitting: Cardiology

## 2015-05-27 ENCOUNTER — Encounter: Payer: Self-pay | Admitting: Internal Medicine

## 2015-06-02 ENCOUNTER — Encounter: Payer: Self-pay | Admitting: *Deleted

## 2015-06-02 ENCOUNTER — Other Ambulatory Visit: Payer: Self-pay | Admitting: *Deleted

## 2015-06-02 DIAGNOSIS — E230 Hypopituitarism: Secondary | ICD-10-CM | POA: Insufficient documentation

## 2015-06-02 DIAGNOSIS — E119 Type 2 diabetes mellitus without complications: Secondary | ICD-10-CM | POA: Insufficient documentation

## 2015-06-04 ENCOUNTER — Encounter: Payer: Self-pay | Admitting: Internal Medicine

## 2015-06-04 ENCOUNTER — Ambulatory Visit (INDEPENDENT_AMBULATORY_CARE_PROVIDER_SITE_OTHER): Payer: Medicare HMO | Admitting: Internal Medicine

## 2015-06-04 VITALS — BP 140/64 | HR 70 | Ht 77.0 in | Wt 242.6 lb

## 2015-06-04 DIAGNOSIS — I4891 Unspecified atrial fibrillation: Secondary | ICD-10-CM

## 2015-06-04 DIAGNOSIS — I255 Ischemic cardiomyopathy: Secondary | ICD-10-CM

## 2015-06-04 DIAGNOSIS — Z95810 Presence of automatic (implantable) cardiac defibrillator: Secondary | ICD-10-CM | POA: Diagnosis not present

## 2015-06-04 DIAGNOSIS — I480 Paroxysmal atrial fibrillation: Secondary | ICD-10-CM | POA: Diagnosis not present

## 2015-06-04 DIAGNOSIS — I5022 Chronic systolic (congestive) heart failure: Secondary | ICD-10-CM

## 2015-06-04 LAB — CUP PACEART INCLINIC DEVICE CHECK
Brady Statistic RV Percent Paced: 37 %
HighPow Impedance: 83 Ohm
Lead Channel Impedance Value: 635 Ohm
Lead Channel Pacing Threshold Amplitude: 0.6 V
Lead Channel Pacing Threshold Pulse Width: 0.4 ms
Lead Channel Sensing Intrinsic Amplitude: 23.4 mV
Lead Channel Setting Sensing Sensitivity: 0.8 mV
MDC IDC MSMT BATTERY VOLTAGE: 3.1 V
MDC IDC MSMT LEADCHNL RA SENSING INTR AMPL: 3.4 mV
MDC IDC SESS DTM: 20161006130700
MDC IDC SET LEADCHNL RV PACING PULSEWIDTH: 0.4 ms
MDC IDC SET ZONE DETECTION INTERVAL: 310 ms
Pulse Gen Model: 540
Pulse Gen Serial Number: 60687186

## 2015-06-04 MED ORDER — APIXABAN 5 MG PO TABS: 5.0000 mg | ORAL_TABLET | Freq: Two times a day (BID) | ORAL | Status: DC

## 2015-06-04 NOTE — Assessment & Plan Note (Signed)
This is a new problem. He has had up to 21 min. Of atrial fib on ICD interogation. His CHADSVASC score is 6. Will start Eliquis.

## 2015-06-04 NOTE — Progress Notes (Signed)
HPI [name] returns today for followup. He is a very pleasant 81 year old man with an ischemic cardiomyopathy, chronic well compensated systolic heart failure, mild renal insufficiency, stage II, diabetes, and hypertension. He has done well in the interim, status post ICD implantation. No ICD shocks. No syncope, and no peripheral edema. He does note some increased blood pressures.  He has not had palpitations.  He denies syncope or ICD shock. Allergies  Allergen Reactions  . Ace Inhibitors Other (See Comments)    Kidney issues  . Penicillins Palpitations     Current Outpatient Prescriptions  Medication Sig Dispense Refill  . aspirin EC 81 MG tablet Take 1 tablet (81 mg total) by mouth daily.    [name] Kitchen CALCIUM-MAGNESIUM-ZINC PO Take 2 tablets by mouth daily.    . carvedilol (COREG) 25 MG tablet Take 25 mg in am and 12.5 mg pm (Patient taking differently: Take 12.5 mg by mouth 2 (two) times daily with a meal. Take 12.5 mg in am and 12.5 mg pm) 135 tablet 6  . furosemide (LASIX) 40 MG tablet Take 40 mg by mouth. Per pt he is still taking and will take  if needed    . glipiZIDE (GLUCOTROL XL) 10 MG 24 hr tablet Take 10 mg by mouth 2 times daily at 12 noon and 4 pm. Pt takes it in the AM and PM    . hydrALAZINE (APRESOLINE) 25 MG tablet Take 1 tablet (25 mg total) by mouth 3 (three) times daily. 270 tablet 3  . HYDROcodone-acetaminophen (NORCO) 10-325 MG tablet Take 1 tablet by mouth every 6 (six) hours as needed.    . Insulin Glargine (TOUJEO SOLOSTAR) 300 UNIT/ML SOPN Inject 10 Units as directed daily.    . Multiple Vitamins-Minerals (MULTIVITAMINS THER. W/MINERALS) TABS Take 1 tablet by mouth daily.    . Omega-3 Fatty Acids (FISH OIL) 1000 MG CAPS Take 1 capsule by mouth 2 (two) times daily.    [name] Kitchen omeprazole (PRILOSEC) 20 MG capsule Take 20 mg by mouth 2 (two) times daily before a meal.    . rosuvastatin (CRESTOR) 20 MG tablet Take 20 mg by mouth every evening.    . tadalafil (CIALIS) 20 MG  tablet Take 20 mg by mouth daily as needed for erectile dysfunction.    [name] Kitchen testosterone cypionate (DEPO-TESTOSTERONE) 200 MG/ML injection Inject into the muscle every 14 (fourteen) days. INJECT 0.6ML- 0.8ML EVERY 2 WEEKS     No current facility-administered medications for this visit.     Past Medical History  Diagnosis Date  . Hypertension   . Hyperlipidemia   . Heart murmur   . Shortness of breath     "lying down & w/exertion"  . Diabetes mellitus   . GERD (gastroesophageal reflux disease)   . Arthritis   . Chronic systolic heart failure (HCC)   . Mitral regurgitation     S/P mitral valve repair [[id number]][  . Ischemic cardiomyopathy     S/P CABG x 3; EF 25%  . Carotid artery disease (HCC)     60-79% LICA  . Allergy to ACE inhibitors     Angioedema  . Renal insufficiency   . Coronary artery disease   . Myocardial infarction (HCC)   . Dysrhythmia     heart block  . ICD (implantable cardiac defibrillator) in place 03/28/2012  . CHF (congestive heart failure) (HCC)     ROS:   All systems reviewed and negative except as noted in the HPI.   Past Surgical History  Procedure Laterality Date  . Knee arthroscopy  ~ 2008    right  . Nasal sinus surgery  1990's    right  . Mitral valve repair  10/17/2011    Procedure: MITRAL VALVE REPAIR (MVR);  Surgeon: Purcell Nails, MD;  Location: [facility] OR;  Service: Open Heart Surgery;  Laterality: N/A;  . Coronary artery bypass graft  10/17/2011    Procedure: CORONARY ARTERY BYPASS GRAFTING (CABG);  Surgeon: Purcell Nails, MD;  Location: [facility] OR;  Service: Open Heart Surgery;  Laterality: N/A;  . Icd  03/28/2012  . Colonoscopy N/A 04/24/2014    RMR Pancolonic diverticulosis  . Esophagogastroduodenoscopy N/A 04/24/2014    RMR Subtle nodularity the gastric mucosa of uncertain significance-status post biopsy. Hiatal hernia. chronic inflammation, no H.pylori  . Left and right heart catheterization with coronary angiogram N/A 10/12/2011     Procedure: LEFT AND RIGHT HEART CATHETERIZATION WITH CORONARY ANGIOGRAM;  Surgeon: Iran Ouch, MD;  Location: [facility] CATH LAB;  Service: Cardiovascular;  Laterality: N/A;  . Implantable cardioverter defibrillator implant N/A 03/28/2012    Procedure: IMPLANTABLE CARDIOVERTER DEFIBRILLATOR IMPLANT;  Surgeon: Marinus Maw, MD;  Location: [facility] CATH LAB;  Service: Cardiovascular;  Laterality: N/A;     Family History  Problem Relation Age of Onset  . Heart disease Mother   . Diabetes Father   . Cardiomyopathy Father      Social History   Social History  . Marital Status: Married    Spouse Name: N/A  . Number of Children: N/A  . Years of Education: N/A   Occupational History  . Retired    Social History Main Topics  . Smoking status: Never Smoker   . Smokeless tobacco: Never Used     Comment: Never smoked  . Alcohol Use: No  . Drug Use: No  . Sexual Activity: Yes   Other Topics Concern  . Not on file   Social History Narrative     BP 140/64 mmHg  Pulse 70  Ht 6\' 5"  (1.956 m)  Wt 242 lb 9.6 oz (110.043 kg)  BMI 28.76 kg/m2  Physical Exam:  Well appearing 81 year old man, NAD HEENT: Unremarkable Neck:  6 cm JVD, no thyromegally Lungs:  Clear with no wheezes, rales, or rhonchi. HEART:  IRegular rate rhythm, no murmurs, no rubs, no clicks Abd:  soft, positive bowel sounds, no organomegally, no rebound, no guarding Ext:  2 plus pulses, no edema, no cyanosis, no clubbing Skin:  No rashes no nodules Neuro:  CN II through XII intact, motor grossly intact  DEVICE  Normal device function.  See PaceArt for details.   Assess/Plan:

## 2015-06-04 NOTE — Patient Instructions (Signed)
Medication Instructions:   START ELIQUIS 5 MG ONE TABLET TWICE DAILY  Labwork:  IN ONE MONTH  Follow-Up:  Your physician wants you to follow-up in: ONE YEAR WITH DR Court Joy will receive a reminder letter in the mail two months in advance. If you don't receive a letter, please call our office to schedule the follow-up appointment.

## 2015-06-04 NOTE — Assessment & Plan Note (Signed)
His blood pressure is slightly elevated today. He will continue his current meds and maintain a low sodium diet.

## 2015-06-04 NOTE — Assessment & Plan Note (Signed)
His symptoms remain class 2A. He will continue his current meds.  

## 2015-06-04 NOTE — Assessment & Plan Note (Signed)
He denies anginal symptoms. Will follow. 

## 2015-07-06 ENCOUNTER — Ambulatory Visit (INDEPENDENT_AMBULATORY_CARE_PROVIDER_SITE_OTHER): Payer: Medicare HMO | Admitting: *Deleted

## 2015-07-06 DIAGNOSIS — I255 Ischemic cardiomyopathy: Secondary | ICD-10-CM

## 2015-07-07 NOTE — Progress Notes (Signed)
Remote ICD transmission.   

## 2015-07-08 LAB — CUP PACEART REMOTE DEVICE CHECK
Battery Remaining Percentage: 89 %
Battery Voltage: 3.13 V
Brady Statistic RV Percent Paced: 61 %
Date Time Interrogation Session: 20161109112722
HighPow Impedance: 89 Ohm
Implantable Lead Implant Date: 20130731
Implantable Lead Location: 753860
Implantable Lead Model: 365501
Implantable Lead Serial Number: 10505793
Lead Channel Impedance Value: 578 Ohm
Lead Channel Pacing Threshold Pulse Width: 0.4 ms
Lead Channel Sensing Intrinsic Amplitude: 16 mV
Lead Channel Sensing Intrinsic Amplitude: 3.1 mV
Lead Channel Setting Pacing Pulse Width: 0.4 ms
MDC IDC MSMT LEADCHNL RV PACING THRESHOLD AMPLITUDE: 1.6 V
MDC IDC SET LEADCHNL RV SENSING SENSITIVITY: 0.8 mV
Pulse Gen Serial Number: 60687186

## 2015-07-09 ENCOUNTER — Encounter: Payer: Self-pay | Admitting: Cardiology

## 2015-07-10 ENCOUNTER — Encounter (HOSPITAL_COMMUNITY): Payer: Self-pay | Admitting: Cardiology

## 2015-07-10 ENCOUNTER — Emergency Department (HOSPITAL_COMMUNITY): Payer: Medicare HMO

## 2015-07-10 ENCOUNTER — Telehealth: Payer: Self-pay | Admitting: *Deleted

## 2015-07-10 ENCOUNTER — Encounter: Payer: Self-pay | Admitting: Internal Medicine

## 2015-07-10 ENCOUNTER — Inpatient Hospital Stay (HOSPITAL_COMMUNITY)
Admission: EM | Admit: 2015-07-10 | Discharge: 2015-07-11 | DRG: 309 | Disposition: A | Discharge status: Home / Self Care | Payer: Medicare HMO | Attending: Internal Medicine | Admitting: Internal Medicine

## 2015-07-10 DIAGNOSIS — I442 Atrioventricular block, complete: Secondary | ICD-10-CM | POA: Insufficient documentation

## 2015-07-10 DIAGNOSIS — I255 Ischemic cardiomyopathy: Secondary | ICD-10-CM | POA: Diagnosis not present

## 2015-07-10 DIAGNOSIS — I6522 Occlusion and stenosis of left carotid artery: Secondary | ICD-10-CM | POA: Diagnosis present

## 2015-07-10 DIAGNOSIS — R55 Syncope and collapse: Secondary | ICD-10-CM | POA: Diagnosis not present

## 2015-07-10 DIAGNOSIS — I10 Essential (primary) hypertension: Secondary | ICD-10-CM

## 2015-07-10 DIAGNOSIS — Z888 Allergy status to other drugs, medicaments and biological substances status: Secondary | ICD-10-CM

## 2015-07-10 DIAGNOSIS — I5022 Chronic systolic (congestive) heart failure: Secondary | ICD-10-CM | POA: Diagnosis present

## 2015-07-10 DIAGNOSIS — Z951 Presence of aortocoronary bypass graft: Secondary | ICD-10-CM

## 2015-07-10 DIAGNOSIS — I480 Paroxysmal atrial fibrillation: Secondary | ICD-10-CM | POA: Diagnosis present

## 2015-07-10 DIAGNOSIS — E1122 Type 2 diabetes mellitus with diabetic chronic kidney disease: Secondary | ICD-10-CM | POA: Diagnosis present

## 2015-07-10 DIAGNOSIS — Z0389 Encounter for observation for other suspected diseases and conditions ruled out: Secondary | ICD-10-CM

## 2015-07-10 DIAGNOSIS — I340 Nonrheumatic mitral (valve) insufficiency: Secondary | ICD-10-CM | POA: Diagnosis present

## 2015-07-10 DIAGNOSIS — I499 Cardiac arrhythmia, unspecified: Secondary | ICD-10-CM | POA: Diagnosis not present

## 2015-07-10 DIAGNOSIS — Z880 Allergy status to penicillin: Secondary | ICD-10-CM

## 2015-07-10 DIAGNOSIS — I472 Ventricular tachycardia: Principal | ICD-10-CM | POA: Diagnosis present

## 2015-07-10 DIAGNOSIS — I130 Hypertensive heart and chronic kidney disease with heart failure and stage 1 through stage 4 chronic kidney disease, or unspecified chronic kidney disease: Secondary | ICD-10-CM | POA: Diagnosis present

## 2015-07-10 DIAGNOSIS — E785 Hyperlipidemia, unspecified: Secondary | ICD-10-CM | POA: Diagnosis present

## 2015-07-10 DIAGNOSIS — K219 Gastro-esophageal reflux disease without esophagitis: Secondary | ICD-10-CM | POA: Diagnosis present

## 2015-07-10 DIAGNOSIS — Z794 Long term (current) use of insulin: Secondary | ICD-10-CM

## 2015-07-10 DIAGNOSIS — N183 Chronic kidney disease, stage 3 unspecified: Secondary | ICD-10-CM | POA: Diagnosis present

## 2015-07-10 DIAGNOSIS — Z95810 Presence of automatic (implantable) cardiac defibrillator: Secondary | ICD-10-CM | POA: Diagnosis present

## 2015-07-10 DIAGNOSIS — I252 Old myocardial infarction: Secondary | ICD-10-CM

## 2015-07-10 DIAGNOSIS — Z4502 Encounter for adjustment and management of automatic implantable cardiac defibrillator: Secondary | ICD-10-CM | POA: Insufficient documentation

## 2015-07-10 DIAGNOSIS — I2510 Atherosclerotic heart disease of native coronary artery without angina pectoris: Secondary | ICD-10-CM | POA: Diagnosis present

## 2015-07-10 LAB — CBC
HCT: 44.8 % (ref 39.0–52.0)
HEMOGLOBIN: 15.3 g/dL (ref 13.0–17.0)
MCH: 32.3 pg (ref 26.0–34.0)
MCHC: 34.2 g/dL (ref 30.0–36.0)
MCV: 94.5 fL (ref 78.0–100.0)
PLATELETS: 194 10*3/uL (ref 150–400)
RBC: 4.74 MIL/uL (ref 4.22–5.81)
RDW: 13.8 % (ref 11.5–15.5)
WBC: 6.6 10*3/uL (ref 4.0–10.5)

## 2015-07-10 LAB — GLUCOSE, CAPILLARY
GLUCOSE-CAPILLARY: 111 mg/dL — AB (ref 65–99)
GLUCOSE-CAPILLARY: 142 mg/dL — AB (ref 65–99)

## 2015-07-10 LAB — BASIC METABOLIC PANEL
ANION GAP: 6 (ref 5–15)
BUN: 19 mg/dL (ref 6–20)
CALCIUM: 8.9 mg/dL (ref 8.9–10.3)
CHLORIDE: 103 mmol/L (ref 101–111)
CO2: 31 mmol/L (ref 22–32)
CREATININE: 1.67 mg/dL — AB (ref 0.61–1.24)
GFR calc non Af Amer: 37 mL/min — ABNORMAL LOW (ref >60)
GFR, EST AFRICAN AMERICAN: 43 mL/min — AB (ref >60)
Glucose, Bld: 136 mg/dL — ABNORMAL HIGH (ref 65–99)
Potassium: 3.7 mmol/L (ref 3.5–5.1)
SODIUM: 140 mmol/L (ref 135–145)

## 2015-07-10 LAB — I-STAT TROPONIN, ED: TROPONIN I, POC: 0.06 ng/mL (ref 0.00–0.08)

## 2015-07-10 LAB — MAGNESIUM: MAGNESIUM: 2 mg/dL (ref 1.7–2.4)

## 2015-07-10 LAB — PROTIME-INR
INR: 1.13 (ref 0.00–1.49)
PROTHROMBIN TIME: 14.6 s (ref 11.6–15.2)

## 2015-07-10 MED ORDER — HYDROCODONE-ACETAMINOPHEN 10-325 MG PO TABS: 1.0000 | ORAL_TABLET | Freq: Four times a day (QID) | ORAL | Status: DC | PRN

## 2015-07-10 MED ORDER — ROSUVASTATIN CALCIUM 40 MG PO TABS
20.0000 mg | ORAL_TABLET | Freq: Every evening | ORAL | Status: DC
  Administered 2015-07-10: 20 mg via ORAL
  Filled 2015-07-10: qty 1

## 2015-07-10 MED ORDER — HYDRALAZINE HCL 25 MG PO TABS
25.0000 mg | ORAL_TABLET | Freq: Two times a day (BID) | ORAL | Status: DC
  Administered 2015-07-10 – 2015-07-11 (×2): 25 mg via ORAL
  Filled 2015-07-10 (×2): qty 1

## 2015-07-10 MED ORDER — PANTOPRAZOLE SODIUM 40 MG PO TBEC
40.0000 mg | DELAYED_RELEASE_TABLET | Freq: Every day | ORAL | Status: DC
  Administered 2015-07-10 – 2015-07-11 (×2): 40 mg via ORAL
  Filled 2015-07-10 (×2): qty 1

## 2015-07-10 MED ORDER — ACETAMINOPHEN 325 MG PO TABS: 650.0000 mg | ORAL_TABLET | ORAL | Status: DC | PRN

## 2015-07-10 MED ORDER — TEMAZEPAM 7.5 MG PO CAPS: 7.5000 mg | ORAL_CAPSULE | Freq: Once | ORAL | Status: DC

## 2015-07-10 MED ORDER — GLIPIZIDE ER 10 MG PO TB24
10.0000 mg | ORAL_TABLET | Freq: Two times a day (BID) | ORAL | Status: DC
  Administered 2015-07-10 – 2015-07-11 (×2): 10 mg via ORAL
  Filled 2015-07-10 (×4): qty 1

## 2015-07-10 MED ORDER — INSULIN GLARGINE 100 UNIT/ML ~~LOC~~ SOLN
15.0000 [IU] | Freq: Every day | SUBCUTANEOUS | Status: DC
  Administered 2015-07-10: 15 [IU] via SUBCUTANEOUS
  Filled 2015-07-10 (×2): qty 0.15

## 2015-07-10 MED ORDER — ZOLPIDEM TARTRATE 5 MG PO TABS
5.0000 mg | ORAL_TABLET | Freq: Every evening | ORAL | Status: DC | PRN
  Administered 2015-07-10: 5 mg via ORAL
  Filled 2015-07-10: qty 1

## 2015-07-10 MED ORDER — ONDANSETRON HCL 4 MG/2ML IJ SOLN: 4.0000 mg | Freq: Four times a day (QID) | INTRAMUSCULAR | Status: DC | PRN

## 2015-07-10 MED ORDER — CARVEDILOL 12.5 MG PO TABS
12.5000 mg | ORAL_TABLET | Freq: Two times a day (BID) | ORAL | Status: DC
  Administered 2015-07-10 – 2015-07-11 (×2): 12.5 mg via ORAL
  Filled 2015-07-10 (×2): qty 1

## 2015-07-10 MED ORDER — FUROSEMIDE 40 MG PO TABS
40.0000 mg | ORAL_TABLET | Freq: Every day | ORAL | Status: DC
  Administered 2015-07-10 – 2015-07-11 (×2): 40 mg via ORAL
  Filled 2015-07-10 (×2): qty 1

## 2015-07-10 MED ORDER — ISOSORBIDE MONONITRATE ER 30 MG PO TB24
30.0000 mg | ORAL_TABLET | Freq: Every day | ORAL | Status: DC
  Administered 2015-07-10 – 2015-07-11 (×2): 30 mg via ORAL
  Filled 2015-07-10 (×3): qty 1

## 2015-07-10 MED ORDER — APIXABAN 5 MG PO TABS
5.0000 mg | ORAL_TABLET | Freq: Two times a day (BID) | ORAL | Status: DC
  Administered 2015-07-10 – 2015-07-11 (×2): 5 mg via ORAL
  Filled 2015-07-10 (×3): qty 1

## 2015-07-10 MED ORDER — INSULIN ASPART 100 UNIT/ML ~~LOC~~ SOLN
0.0000 [IU] | Freq: Three times a day (TID) | SUBCUTANEOUS | Status: DC
  Administered 2015-07-11: 5 [IU] via SUBCUTANEOUS

## 2015-07-10 NOTE — ED Provider Notes (Signed)
  Face-to-face evaluation   History: Patient presents for evaluation of syncope. He was contacted by his cardiologist's office because his defibrillator firing, triggering a notice to them.  Patient injured his left elbow when he fell. Evident that he has recovered to his baseline.  Physical exam: Alert, elderly man in minimal discomfort. Moves all extremities easily. He is alert, lucid, and calm.  Medical screening examination/treatment/procedure(s) were conducted as a shared visit with non-physician practitioner(s) and myself.  I personally evaluated the patient during the encounter  Mancel Bale, MD 07/10/15 [phone]

## 2015-07-10 NOTE — H&P (Signed)
History and Physical    Patient ID: Wayne Gordon MRN: [id number], DOB/AGE: 1934-08-03 81 y.o.  Admit date: 07/10/2015 Date of Consult: 07/10/2015   Primary Physician: Samuel Jester, DO Primary Cardiologist/electrophysiologist: Dr. Ladona Ridgel  Reason for Admissioin: Syncope associated with VT and ICD therapies  HPI: Wayne Gordon is a 81 y.o. male who was brought to the [facility] ER via EMS after a syncopal event at home.  He has PMHx of Ischemic CM, CAD/VHD s/p CABG and MV repair, HTN, hyperlipidemia, and recently new PAF was feeling well, he was in the BR getting ready to shave when he suddenly felt weak and recalls nothing further until he woke with his wife at his side.  She had called EMS and he was brought in.  He denies any kind of CP at all, not pre or post event, no palpitations, no SOB.  He has never been shocked before.  He reports doing well able to golf and go the "[employer]" to exercise and do his routine daily activities without difficulty.  Our office was notified by [employer] that he had received 3 therapies from his device for a ventricular arrhythmia.  The patient is denies any symptoms at this time, his VSS and is in SR.  He denies any head or neck pain, the wife states she doesn't think he hit his head on anything particular, she states he appears to have fallen backwards onto the carpeted floor of the bedroom. He denies pain anywhere at this time.    Past Medical History  Diagnosis Date  . Hypertension   . Hyperlipidemia   . Heart murmur   . Shortness of breath     "lying down & w/exertion"  . Diabetes mellitus   . GERD (gastroesophageal reflux disease)   . Arthritis   . Chronic systolic heart failure (HCC)   . Mitral regurgitation     S/P mitral valve repair [[id number]][  . Ischemic cardiomyopathy     S/P CABG x 3; EF 25%  . Carotid artery disease (HCC)     60-79% LICA  . Allergy to ACE inhibitors     Angioedema  . Renal insufficiency   . Coronary artery disease   .  Myocardial infarction (HCC)   . Dysrhythmia     heart block  . ICD (implantable cardiac defibrillator) in place 03/28/2012  . CHF (congestive heart failure) [facility])      Surgical History:  Past Surgical History  Procedure Laterality Date  . Knee arthroscopy  ~ 2008    right  . Nasal sinus surgery  1990's    right  . Mitral valve repair  10/17/2011    Procedure: MITRAL VALVE REPAIR (MVR);  Surgeon: Purcell Nails, MD;  Location: [facility] OR;  Service: Open Heart Surgery;  Laterality: N/A;  . Coronary artery bypass graft  10/17/2011    Procedure: CORONARY ARTERY BYPASS GRAFTING (CABG);  Surgeon: Purcell Nails, MD;  Location: [facility] OR;  Service: Open Heart Surgery;  Laterality: N/A;  . Icd  03/28/2012  . Colonoscopy N/A 04/24/2014    RMR Pancolonic diverticulosis  . Esophagogastroduodenoscopy N/A 04/24/2014    RMR Subtle nodularity the gastric mucosa of uncertain significance-status post biopsy. Hiatal hernia. chronic inflammation, no H.pylori  . Left and right heart catheterization with coronary angiogram N/A 10/12/2011    Procedure: LEFT AND RIGHT HEART CATHETERIZATION WITH CORONARY ANGIOGRAM;  Surgeon: Iran Ouch, MD;  Location: [facility] CATH LAB;  Service: Cardiovascular;  Laterality: N/A;  . Implantable  cardioverter defibrillator implant N/A 03/28/2012    Procedure: IMPLANTABLE CARDIOVERTER DEFIBRILLATOR IMPLANT;  Surgeon: Marinus Maw, MD;  Location: [facility] CATH LAB;  Service: Cardiovascular;  Laterality: N/A;      (Not in a hospital admission)  Inpatient Medications:   Allergies:  Allergies  Allergen Reactions  . Ace Inhibitors Other (See Comments)    Kidney issues  . Penicillins Palpitations    Has patient had a PCN reaction causing immediate rash, facial/tongue/throat swelling, SOB or lightheadedness with hypotension: } Has patient had a PCN reaction causing severe rash involving mucus membranes or skin necrosis: } Has patient had a PCN reaction that required hospitalization } Has  patient had a PCN reaction occurring within the last 10 years: } If all of the above answers are "NO", then may proceed with Cephalosporin use.   Social History   Social History  . Marital Status: Married    Spouse Name: N/A  . Number of Children: N/A  . Years of Education: N/A   Occupational History  . Retired    Social History Main Topics  . Smoking status: Never Smoker   . Smokeless tobacco: Never Used     Comment: Never smoked  . Alcohol Use: No  . Drug Use: No  . Sexual Activity: Yes   Other Topics Concern  . Not on file   Social History Narrative     Family History  Problem Relation Age of Onset  . Heart disease Mother   . Diabetes Father   . Cardiomyopathy Father      Review of Systems: All other systems reviewed and are otherwise negative except as noted above.  Physical Exam: Filed Vitals:   07/10/15 0943 07/10/15 1000 07/10/15 1116  BP: 158/87 155/87 153/81  Pulse: 68 65 65  Temp: 97.9 F (36.6 C)    TempSrc: Oral    Resp: 18 22 13   Weight: 242 lb (109.77 kg)    SpO2: 98% 97% 95%    GEN- The patient is well appearing, alert and oriented x 3 today.   HEENT: normocephalic, atraumatic; sclera clear, conjunctiva pink; hearing intact; oropharynx clear; neck supple, no JVP Lymph- no cervical lymphadenopathy Lungs- Clear to ausculation bilaterally, normal work of breathing.  No wheezes, rales, rhonchi Heart- Regular rate and rhythm, 2/6 SM, no rubs or gallops, PMI not laterally displaced GI- soft, non-tender, non-distended, bowel sounds present Extremities- no clubbing, cyanosis, or edema MS- no significant deformity or atrophy Skin- warm and dry, no rash or lesion Psych- euthymic mood, full affect Neuro- no gross deficits observed  Labs:   Lab Results  Component Value Date   WBC 6.6 07/10/2015   HGB 15.3 07/10/2015   HCT 44.8 07/10/2015   MCV 94.5 07/10/2015   PLT 194 07/10/2015    Recent Labs Lab 07/10/15 0951  NA 140  K 3.7  CL 103   CO2 31  BUN 19  CREATININE 1.67*  CALCIUM 8.9  GLUCOSE 136*   Troponin 0.06  02/22/15: Echocardiogram Study Conclusions - Left ventricle: The cavity size was mildly dilated. Wall thickness was increased in a pattern of mild LVH. Systolic function was severely reduced. The estimated ejection fraction was in the range of 20% to 25%. There is akinesis of the inferior and inferoseptal myocardium with otherwise severe diffuse hypokinesis. Features are consistent with a pseudonormal left ventricular filling pattern, with concomitant abnormal relaxation and increased filling pressure (grade 2 diastolic dysfunction). Doppler parameters are consistent with elevated mean left atrial filling pressure. -  Aortic valve: Moderately calcified annulus extends to LVOT on some views. Mildly calcified leaflets. There was no stenosis. Mild regurgitation. Mean gradient: 3mm Hg (S). - Mitral valve: Mildly thickened leaflets. Evidence of annuloplasty repair. Mild regurgitation. Mean gradient: 3mm Hg (D). - Left atrium: The atrium was mildly dilated. - Right ventricle: The cavity size was mildly dilated. Systolic function was mildly to moderately reduced. - Tricuspid valve: Mild regurgitation. Peak RV-RA gradient: 25mm Hg (S). - Pericardium, extracardiac: There was no pericardial effusion.    Radiology/Studies:  Dg Chest 2 View 07/10/2015  CLINICAL DATA:  Syncope with fall.  Cardiac arrhythmia. EXAM: CHEST  2 VIEW COMPARISON:  September 01, 2014 FINDINGS: There is stable elevation of the right hemidiaphragm. There is no edema or consolidation. Heart is mildly enlarged with pulmonary vascular within normal limits. Pacemaker lead is attached to the right ventricle. There is a mitral valve replacement as well as coronary artery bypass grafting. No adenopathy. No pneumothorax. No bone lesions. IMPRESSION: Stable elevation of the right hemidiaphragm. No edema or  consolidation. No change in cardiac silhouette. Electronically Signed   By: Bretta Bang III M.D.   On: 07/10/2015 10:40    EKG: SR, 1st degree AVBlock,  LBBB, ST/T changes not felt to be acute TELEMETRY: SR, 1st degree AVblock, occ PVCs observed DEVICE HISTORY: [employer] ICD implanted 2013  Remote device transmission: noting VT and 3 therapies/shocks delivered, pending full device interrogation  Assessment and Plan:  1. Syncope     Correlates with sustained VT, s/p ICD therapies x3     Check mag     We have called [employer] to check his device     The patient has been advised no driving for 6 months by [place] law      2. Ischemic CM     He appears euvolemic     We will add Imdur to his regime (Hydralazine)     Continue his home meds     He was on an ARB until June of 2015 when he had a hospital stay with acute renal insufficiency and was held.  We will defer this to his out patient /primary cardiologist     Check an echo doppler  3. He has new onset PAF recently placed on Eliquis out patient (no ASA as listed on his home meds)       He is pending CT head, if no hemorrhage given his syncope and potential head trauma, will resume  4. CAD/VHD s/p CABG and MV repair in 2013     He has no CP, his EKG is not felt to have acute changes     Continue his statin, BB     5. HTN     Appears stable  6. DM     Sliding scale and pharmacy consult for DM management and home DM meds  Signed, Francis Dowse, PA-C 07/10/2015 12:48 PM   Complete Haert block  The pt has infrequent Afib  Now on apixoban  Has had prior VT with successful ATP but on this occasion developed ventricular tachycardia that accelerated into the ventricular fibrillation zone. Because of the acceleration of greater than 12%, ATP did not occur. The patient was shocked 3 occasions at 30 J at 71 J and 40 J with reverse polarity prior to restoration of sinus rhythm.  The patient was also noted to have complete heart block  prior to and following ICD shock. Interrogation of the histogram suggests that heart block is present 10-15% of  the time. Not withstanding, the patient is relatively asymptomatic and hence we will not seek to make more effective his VDD pacing algorithm so as to avoid RV apical pacing  Reassess LV function No driving x 6 month Reprogram device. We will make it a 2 zone device which will force ATP in zone one. This will be 180--210. Because of failed shocks, all shocks will be programmed at maximum output. At this juncture I would not add sotalol to lower DFT as it might aggravate his complete heart block Anticipate discharge in a.m.   Given the infrequency of his ventricular arrhythmia, we will not begin antiarrhythmic therapy.

## 2015-07-10 NOTE — ED Notes (Signed)
Cardiology at the bedside.

## 2015-07-10 NOTE — Telephone Encounter (Signed)
Alert seen on [employer] Monitoring- VF converted with 3 shocks. Called mobile #- wife answered and reports EMS is taking pt to the hospital due to syncopal episode while shaving. Made wife aware of episode and driving restrictions x 6 months. I will alert EP APP that the patient is on the way to [facility].

## 2015-07-10 NOTE — ED Provider Notes (Signed)
CSN: [id number]     Arrival date & time 07/10/15  5498 History   First MD Initiated Contact with Patient 07/10/15 1048     Chief Complaint  Patient presents with  . Loss of Consciousness     (Consider location/radiation/quality/duration/timing/severity/associated sxs/prior Treatment) HPI Patient presents to the emergency department with a syncopal episode that occurred just prior to arrival.  Patient states he was washing his face when he started feeling a lightheaded and his vision went black and he awoke on the floor the bathroom.  The wife states she was called about the fact that his defibrillator fired.  Patient states that he did not have any chest pain, shortness of breath, weakness, dizziness, headache, blurred vision, back pain, neck pain, abdominal pain, nausea, vomiting, diarrhea or fevers.  Patient states that he did not take any new medications Past Medical History  Diagnosis Date  . Hypertension   . Hyperlipidemia   . Heart murmur   . Shortness of breath     "lying down & w/exertion"  . Diabetes mellitus   . GERD (gastroesophageal reflux disease)   . Arthritis   . Chronic systolic heart failure (HCC)   . Mitral regurgitation     S/P mitral valve repair [333093][  . Ischemic cardiomyopathy     S/P CABG x 3; EF 25%  . Carotid artery disease (HCC)     60-79% LICA  . Allergy to ACE inhibitors     Angioedema  . Renal insufficiency   . Coronary artery disease   . Myocardial infarction (HCC)   . Dysrhythmia     heart block  . ICD (implantable cardiac defibrillator) in place 03/28/2012  . CHF (congestive heart failure) [facility])    Past Surgical History  Procedure Laterality Date  . Knee arthroscopy  ~ 2008    right  . Nasal sinus surgery  1990's    right  . Mitral valve repair  10/17/2011    Procedure: MITRAL VALVE REPAIR (MVR);  Surgeon: Purcell Nails, MD;  Location: [facility] OR;  Service: Open Heart Surgery;  Laterality: N/A;  . Coronary artery bypass graft  10/17/2011     Procedure: CORONARY ARTERY BYPASS GRAFTING (CABG);  Surgeon: Purcell Nails, MD;  Location: [facility] OR;  Service: Open Heart Surgery;  Laterality: N/A;  . Icd  03/28/2012  . Colonoscopy N/A 04/24/2014    RMR Pancolonic diverticulosis  . Esophagogastroduodenoscopy N/A 04/24/2014    RMR Subtle nodularity the gastric mucosa of uncertain significance-status post biopsy. Hiatal hernia. chronic inflammation, no H.pylori  . Left and right heart catheterization with coronary angiogram N/A 10/12/2011    Procedure: LEFT AND RIGHT HEART CATHETERIZATION WITH CORONARY ANGIOGRAM;  Surgeon: Iran Ouch, MD;  Location: [facility] CATH LAB;  Service: Cardiovascular;  Laterality: N/A;  . Implantable cardioverter defibrillator implant N/A 03/28/2012    Procedure: IMPLANTABLE CARDIOVERTER DEFIBRILLATOR IMPLANT;  Surgeon: Marinus Maw, MD;  Location: [facility] CATH LAB;  Service: Cardiovascular;  Laterality: N/A;   Family History  Problem Relation Age of Onset  . Heart disease Mother   . Diabetes Father   . Cardiomyopathy Father    Social History  Substance Use Topics  . Smoking status: Never Smoker   . Smokeless tobacco: Never Used     Comment: Never smoked  . Alcohol Use: No    Review of Systems  All other systems negative except as documented in the HPI. All pertinent positives and negatives as reviewed in the HPI.  Allergies  Ace  inhibitors and Penicillins  Home Medications   Prior to Admission medications   Medication Sig Start Date End Date Taking? Authorizing Provider  apixaban (ELIQUIS) 5 MG TABS tablet Take 1 tablet (5 mg total) by mouth 2 (two) times daily. 06/04/15  Yes Marinus Maw, MD  CALCIUM-MAGNESIUM-ZINC PO Take 2 tablets by mouth daily.   Yes Historical Provider, MD  carvedilol (COREG) 25 MG tablet Take 25 mg in am and 12.5 mg pm Patient taking differently: Take 12.5 mg by mouth 2 (two) times daily with a meal.  06/06/14  Yes Marinus Maw, MD  furosemide (LASIX) 40 MG tablet Take 40 mg by  mouth. Per pt he is still taking and will take  if needed   Yes Historical Provider, MD  glipiZIDE (GLUCOTROL XL) 10 MG 24 hr tablet Take 10 mg by mouth 2 times daily at 12 noon and 4 pm. Pt takes it in the AM and PM   Yes Historical Provider, MD  hydrALAZINE (APRESOLINE) 25 MG tablet Take 1 tablet (25 mg total) by mouth 3 (three) times daily. Patient taking differently: Take 25 mg by mouth 2 (two) times daily.  09/22/14  Yes Marinus Maw, MD  Insulin Glargine (TOUJEO SOLOSTAR) 300 UNIT/ML SOPN Inject 15 Units as directed daily.  04/27/15  Yes Historical Provider, MD  Multiple Vitamins-Minerals (MULTIVITAMINS THER. W/MINERALS) TABS Take 1 tablet by mouth daily.   Yes Historical Provider, MD  Omega-3 Fatty Acids (FISH OIL) 1000 MG CAPS Take 1 capsule by mouth 2 (two) times daily.   Yes Historical Provider, MD  omeprazole (PRILOSEC) 20 MG capsule Take 20 mg by mouth daily.    Yes Historical Provider, MD  rosuvastatin (CRESTOR) 20 MG tablet Take 20 mg by mouth every evening.   Yes Historical Provider, MD  testosterone cypionate (DEPO-TESTOSTERONE) 200 MG/ML injection Inject into the muscle every 14 (fourteen) days. INJECT 0.6ML- 0.8ML EVERY 2 WEEKS 08/13/14  Yes Historical Provider, MD  aspirin EC 81 MG tablet Take 1 tablet (81 mg total) by mouth daily. Patient not taking: Reported on 07/10/2015 01/03/13   Rande Brunt, PA-C  HYDROcodone-acetaminophen (NORCO) 10-325 MG tablet Take 1 tablet by mouth every 6 (six) hours as needed for moderate pain.  04/15/15   Historical Provider, MD  tadalafil (CIALIS) 20 MG tablet Take 20 mg by mouth daily as needed for erectile dysfunction.    Historical Provider, MD   BP 153/81 mmHg  Pulse 65  Temp(Src) 97.9 F (36.6 C) (Oral)  Resp 13  Wt 242 lb (109.77 kg)  SpO2 95% Physical Exam  Constitutional: He is oriented to person, place, and time. He appears well-developed and well-nourished. No distress.  HENT:  Head: Normocephalic and atraumatic.   Mouth/Throat: Oropharynx is clear and moist.  Eyes: Pupils are equal, round, and reactive to light.  Neck: Normal range of motion. Neck supple.  Cardiovascular: Normal rate, regular rhythm and normal heart sounds.  Exam reveals no gallop and no friction rub.   No murmur heard. Pulmonary/Chest: Effort normal and breath sounds normal. No respiratory distress. He has no wheezes.  Abdominal: Soft. Bowel sounds are normal. He exhibits no distension. There is no tenderness.  Neurological: He is alert and oriented to person, place, and time. He exhibits normal muscle tone. Coordination normal.  Skin: Skin is warm and dry. No rash noted. No erythema.  Psychiatric: He has a normal mood and affect. His behavior is normal.  Nursing note and vitals reviewed.   ED Course  Procedures (including critical care time) Labs Review Labs Reviewed  BASIC METABOLIC PANEL - Abnormal; Notable for the following:    Glucose, Bld 136 (*)    Creatinine, Ser 1.67 (*)    GFR calc non Af Amer 37 (*)    GFR calc Af Amer 43 (*)    All other components within normal limits  CBC  PROTIME-INR  I-STAT TROPOININ, ED    Imaging Review Dg Chest 2 View  07/10/2015  CLINICAL DATA:  Syncope with fall.  Cardiac arrhythmia. EXAM: CHEST  2 VIEW COMPARISON:  September 01, 2014 FINDINGS: There is stable elevation of the right hemidiaphragm. There is no edema or consolidation. Heart is mildly enlarged with pulmonary vascular within normal limits. Pacemaker lead is attached to the right ventricle. There is a mitral valve replacement as well as coronary artery bypass grafting. No adenopathy. No pneumothorax. No bone lesions. IMPRESSION: Stable elevation of the right hemidiaphragm. No edema or consolidation. No change in cardiac silhouette. Electronically Signed   By: Bretta Bang III M.D.   On: 07/10/2015 10:40   I have personally reviewed and evaluated these images and lab results as part of my medical decision-making.   EKG  Interpretation   Date/Time:  Friday July 10 2015 09:44:11 EST Ventricular Rate:  65 PR Interval:  336 QRS Duration: 154 QT Interval:  446 QTC Calculation: 464 R Axis:   -44 Text Interpretation:  Sinus or ectopic atrial rhythm Prolonged PR interval  Left bundle branch block Since last tracing PVC resolved Confirmed by  Effie Shy  MD, Mechele Collin ([id number]) on 07/10/2015 10:34:41 AM      MDM   Final diagnoses:  None   I spoke with cardiology who will admit the patient for further evaluation and care of this issue      Charlestine Night, PA-C 07/10/15 1531  Mancel Bale, MD 07/10/15 1939

## 2015-07-10 NOTE — ED Notes (Signed)
Wife reports the office called and said the patients defib fired this morning during the syncopal episode.

## 2015-07-10 NOTE — ED Notes (Signed)
Patient returned from CT

## 2015-07-10 NOTE — Progress Notes (Signed)
07/10/2015 1530 Received pt to room 2w15 from ER.  Pt is A&O, no c/o voiced.  Tele applied and CCMD notified.  Oriented to room, call light and bed.  Call bell in reach, family at bedside. Kathryne Hitch

## 2015-07-10 NOTE — ED Notes (Signed)
Pt to department via EMS- pt from home with a unwitnessed syncopal episode, wife found the patient on the floor. Pt with hx of pacemaker, denies any symptoms prior to the syncopal episode. Bp-156/86 Hr-70 Cbg-124

## 2015-07-11 ENCOUNTER — Encounter: Payer: Self-pay | Admitting: Physician Assistant

## 2015-07-11 ENCOUNTER — Other Ambulatory Visit: Payer: Self-pay | Admitting: Physician Assistant

## 2015-07-11 ENCOUNTER — Observation Stay (HOSPITAL_COMMUNITY): Payer: Medicare HMO

## 2015-07-11 DIAGNOSIS — R55 Syncope and collapse: Secondary | ICD-10-CM | POA: Diagnosis present

## 2015-07-11 DIAGNOSIS — Z888 Allergy status to other drugs, medicaments and biological substances status: Secondary | ICD-10-CM | POA: Diagnosis not present

## 2015-07-11 DIAGNOSIS — K219 Gastro-esophageal reflux disease without esophagitis: Secondary | ICD-10-CM | POA: Diagnosis present

## 2015-07-11 DIAGNOSIS — I5022 Chronic systolic (congestive) heart failure: Secondary | ICD-10-CM | POA: Diagnosis present

## 2015-07-11 DIAGNOSIS — I252 Old myocardial infarction: Secondary | ICD-10-CM | POA: Diagnosis not present

## 2015-07-11 DIAGNOSIS — I499 Cardiac arrhythmia, unspecified: Secondary | ICD-10-CM | POA: Diagnosis not present

## 2015-07-11 DIAGNOSIS — I6522 Occlusion and stenosis of left carotid artery: Secondary | ICD-10-CM | POA: Diagnosis present

## 2015-07-11 DIAGNOSIS — E785 Hyperlipidemia, unspecified: Secondary | ICD-10-CM | POA: Diagnosis present

## 2015-07-11 DIAGNOSIS — E1122 Type 2 diabetes mellitus with diabetic chronic kidney disease: Secondary | ICD-10-CM | POA: Diagnosis present

## 2015-07-11 DIAGNOSIS — I2510 Atherosclerotic heart disease of native coronary artery without angina pectoris: Secondary | ICD-10-CM | POA: Diagnosis present

## 2015-07-11 DIAGNOSIS — I442 Atrioventricular block, complete: Secondary | ICD-10-CM | POA: Diagnosis present

## 2015-07-11 DIAGNOSIS — Z880 Allergy status to penicillin: Secondary | ICD-10-CM | POA: Diagnosis not present

## 2015-07-11 DIAGNOSIS — Z95810 Presence of automatic (implantable) cardiac defibrillator: Secondary | ICD-10-CM | POA: Diagnosis not present

## 2015-07-11 DIAGNOSIS — I255 Ischemic cardiomyopathy: Secondary | ICD-10-CM

## 2015-07-11 DIAGNOSIS — N183 Chronic kidney disease, stage 3 unspecified: Secondary | ICD-10-CM

## 2015-07-11 DIAGNOSIS — I130 Hypertensive heart and chronic kidney disease with heart failure and stage 1 through stage 4 chronic kidney disease, or unspecified chronic kidney disease: Secondary | ICD-10-CM | POA: Diagnosis present

## 2015-07-11 DIAGNOSIS — Z951 Presence of aortocoronary bypass graft: Secondary | ICD-10-CM | POA: Diagnosis not present

## 2015-07-11 DIAGNOSIS — I340 Nonrheumatic mitral (valve) insufficiency: Secondary | ICD-10-CM | POA: Diagnosis present

## 2015-07-11 DIAGNOSIS — I472 Ventricular tachycardia: Secondary | ICD-10-CM | POA: Diagnosis present

## 2015-07-11 DIAGNOSIS — I480 Paroxysmal atrial fibrillation: Secondary | ICD-10-CM | POA: Diagnosis present

## 2015-07-11 DIAGNOSIS — Z794 Long term (current) use of insulin: Secondary | ICD-10-CM | POA: Diagnosis not present

## 2015-07-11 LAB — PROTIME-INR
INR: 1.29 (ref 0.00–1.49)
PROTHROMBIN TIME: 16.2 s — AB (ref 11.6–15.2)

## 2015-07-11 LAB — BASIC METABOLIC PANEL
ANION GAP: 7 (ref 5–15)
BUN: 19 mg/dL (ref 6–20)
CALCIUM: 8.9 mg/dL (ref 8.9–10.3)
CO2: 32 mmol/L (ref 22–32)
Chloride: 103 mmol/L (ref 101–111)
Creatinine, Ser: 1.56 mg/dL — ABNORMAL HIGH (ref 0.61–1.24)
GFR calc Af Amer: 47 mL/min — ABNORMAL LOW (ref >60)
GFR, EST NON AFRICAN AMERICAN: 41 mL/min — AB (ref >60)
GLUCOSE: 126 mg/dL — AB (ref 65–99)
Potassium: 3.6 mmol/L (ref 3.5–5.1)
SODIUM: 142 mmol/L (ref 135–145)

## 2015-07-11 LAB — CBC
HCT: 42.4 % (ref 39.0–52.0)
HEMOGLOBIN: 14.2 g/dL (ref 13.0–17.0)
MCH: 32 pg (ref 26.0–34.0)
MCHC: 33.5 g/dL (ref 30.0–36.0)
MCV: 95.5 fL (ref 78.0–100.0)
Platelets: 187 10*3/uL (ref 150–400)
RBC: 4.44 MIL/uL (ref 4.22–5.81)
RDW: 13.8 % (ref 11.5–15.5)
WBC: 6 10*3/uL (ref 4.0–10.5)

## 2015-07-11 LAB — GLUCOSE, CAPILLARY
GLUCOSE-CAPILLARY: 104 mg/dL — AB (ref 65–99)
GLUCOSE-CAPILLARY: 211 mg/dL — AB (ref 65–99)
GLUCOSE-CAPILLARY: 50 mg/dL — AB (ref 65–99)

## 2015-07-11 LAB — LIPID PANEL
Cholesterol: 156 mg/dL (ref 0–200)
HDL: 25 mg/dL — AB (ref >40)
LDL Cholesterol: 103 mg/dL — ABNORMAL HIGH (ref 0–99)
Total CHOL/HDL Ratio: 6.2 RATIO
Triglycerides: 142 mg/dL (ref <150)
VLDL: 28 mg/dL (ref 0–40)

## 2015-07-11 MED ORDER — CARVEDILOL 25 MG PO TABS: 12.5000 mg | ORAL_TABLET | Freq: Two times a day (BID) | ORAL | Status: DC

## 2015-07-11 MED ORDER — LISINOPRIL 2.5 MG PO TABS: 2.5000 mg | ORAL_TABLET | Freq: Every day | ORAL | Status: DC

## 2015-07-11 MED ORDER — PERFLUTREN LIPID MICROSPHERE
1.0000 mL | INTRAVENOUS | Status: AC | PRN
  Administered 2015-07-11: 2 mL via INTRAVENOUS
  Filled 2015-07-11: qty 10

## 2015-07-11 MED ORDER — LOSARTAN POTASSIUM 25 MG PO TABS: 25.0000 mg | ORAL_TABLET | Freq: Every day | ORAL | Status: DC

## 2015-07-11 MED ORDER — ISOSORBIDE MONONITRATE ER 30 MG PO TB24: 30.0000 mg | ORAL_TABLET | Freq: Every day | ORAL | Status: DC

## 2015-07-11 MED ORDER — HYDRALAZINE HCL 25 MG PO TABS: 25.0000 mg | ORAL_TABLET | Freq: Two times a day (BID) | ORAL | Status: DC

## 2015-07-11 NOTE — Discharge Instructions (Signed)
NO DRIVING FOR 6 MONTHS.  DO NOT TAKE Cialis.  This medication can dangerously react with a new medication started this admission (Isosorbide).  The office will call you to arrange follow up as well as a lab visit.

## 2015-07-11 NOTE — Care Management Obs Status (Signed)
MEDICARE OBSERVATION STATUS NOTIFICATION   Patient Details  Name: Wayne Gordon MRN: [id number] Date of Birth: 09-22-1933   Medicare Observation Status Notification Given:  Yes    Antony Haste, RN 07/11/2015, 10:28 AM

## 2015-07-11 NOTE — Progress Notes (Signed)
  Echocardiogram 2D Echocardiogram with 45ml of Definity has been performed.  Leta Jungling M 07/11/2015, 11:16 AM

## 2015-07-11 NOTE — Discharge Summary (Signed)
Discharge Summary   Patient ID: Wayne Gordon, MRN: [id number], DOB/AGE: 79-Aug-1935 79 y.o.  Admit date: 07/10/2015 Discharge date: 07/11/2015   Primary Care Physician:  Aram Beecham [name]   Primary Cardiologist:  Dr. Lewayne Bunting  Primary Electrophysiologist:  Dr. Lewayne Bunting   Reason for Admission:  Syncope   Primary Discharge Diagnoses:  Principal Problem:   Ventricular arrhythmia Active Problems:   Syncope and collapse   Chronic systolic heart failure (HCC)   Ischemic cardiomyopathy   CKD (chronic kidney disease) stage 3, GFR 30-59 ml/min   Automatic implantable cardioverter-defibrillator in situ     Wt Readings from Last 3 Encounters:  07/10/15 240 lb 4.8 oz (109 kg)  06/04/15 242 lb 9.6 oz (110.043 kg)  02/18/15 244 lb (110.678 kg)    Secondary Discharge Diagnoses:   Past Medical History  Diagnosis Date  . Hypertension   . Hyperlipidemia   . Heart murmur   . Shortness of breath     "lying down & w/exertion"  . Diabetes mellitus   . GERD (gastroesophageal reflux disease)   . Arthritis   . Chronic systolic heart failure (HCC)   . Mitral regurgitation     S/P mitral valve repair [333093][  . Ischemic cardiomyopathy     S/P CABG x 3; EF 25%  . Carotid artery disease (HCC)     60-79% LICA  . Allergy to ACE inhibitors     Angioedema many years ago; patient has tolerated Losartan (ARB) in the past - it was held during [facility] for GI bleed and worsening renal function >> resume Losartan 25 mg QD 06/2015  . Renal insufficiency   . Coronary artery disease   . Myocardial infarction (HCC)   . Dysrhythmia     heart block  . ICD (implantable cardiac defibrillator) in place 03/28/2012  . CHF (congestive heart failure) (HCC)       Allergies:     ACE inhibitors - previously stopped due to worsening renal function PCN   Procedures Performed This Admission:   None   Hospital Course:  Wayne Gordon is a 81 y.o. male with a history of CAD and valvular heart  disease status post CABG and mitral valve repair, ischemic cardiomyopathy, systolic HF, s/p ICD, HTN, HL and recently diagnosed PAF. He had been placed on apixaban. He was admitted yesterday after a syncopal event at home. Remote device interrogation indicated ventricular tachycardia with 3 shocks delivered. Complete interrogation of his device demonstrated prior VT with successful ATP. However, on this occasion, he accelerated into the VF zone. Because of the acceleration of greater than 12%, ATP did not occur. Patient was shocked 3 (30 J, 40 J, 40 J). It was also noted that the patient had complete heart block prior to and following ICD shock. It was noted that heart block is present 10-15% of the time. He was noted to be relatively asymptomatic. It was not felt that he needed more effective DDD pacing algorithm so as to avoid RV apical pacing. His device was reprogrammed. It was made a 2 zone device which will force ATP in zone 1. This will be 180-210. All shocks were programmed to maximum output. Sotalol was not added as it may aggravate complete heart block. Given the infrequency of ventricular arrhythmia, antiarrhythmic therapy was not initiated. CHF therapy was further advanced by adding nitrates to his hydralazine. He does not use PDE-5 inhibitors.  It was also felt that his renal function was stable enough to resume ACE  inhibitor therapy. He is placed on lisinopril 2.5 mg daily at discharge (** see below >> Lisinopril DC'd and new Rx sent to his pharmacy for Losartan 25 mg QD**). Follow-up BMET will need to be obtained in one week and 3 weeks. Echocardiogram was performed this admission. Report is currently pending. Patient was evaluated by Dr. Graciela Husbands this morning and felt to be stable for discharge to home.  He is advised to not drive for 6 mos.   Discharge Vitals:   Blood pressure 151/71, pulse 60, temperature 97.3 F (36.3 C), temperature source Oral, resp. rate 17, height  (1.956 m), weight 240  lb 4.8 oz (109 kg), SpO2 99 %.   Labs:   Recent Labs  07/10/15 0951 07/11/15 0353  WBC 6.6 6.0  HGB 15.3 14.2  HCT 44.8 42.4  MCV 94.5 95.5  PLT 194 187     Recent Labs  07/10/15 0951 07/11/15 0353  NA 140 142  K 3.7 3.6  CL 103 103  CO2 31 32  BUN 19 19  CREATININE 1.67* 1.56*  CALCIUM 8.9 8.9     TROPONIN I, POC  Date/Time Value Ref Range Status  07/10/2015 10:09 AM 0.06 0.00 - 0.08 ng/mL Final    Lab Results  Component Value Date   CHOL 156 07/11/2015   HDL 25* 07/11/2015   LDLCALC 103* 07/11/2015   TRIG 142 07/11/2015     Recent Labs  07/10/15 0951 07/11/15 0353  INR 1.13 1.29     Diagnostic Procedures and Studies:  Dg Chest 2 View  07/10/2015   IMPRESSION: Stable elevation of the right hemidiaphragm. No edema or consolidation. No change in cardiac silhouette. Electronically Signed   By: Bretta Bang III M.D.   On: 07/10/2015 10:40   Ct Head Wo Contrast  07/10/2015   IMPRESSION: No evidence of acute intracranial abnormality. Electronically Signed   By: Sebastian Ache M.D.   On: 07/10/2015 14:21    2D Echocardiogram 07/11/15 Results Pending   Disposition:   Pt is being discharged home today in good condition.  Follow-up Plans & Appointments      Follow-up Information    Follow up with Lewayne Bunting, MD In 2 weeks.   Specialty:  Cardiology   Why:  the office will call to arrange a follow up appointment   Contact information:   [address] [place] [address] [phone]       Follow up with [facility].   Specialty:  Cardiology   Why:  Needs BMET in 1 week and 3 weeks from now   Contact information:   [address] [place] [address] [phone]      Discharge Medications    Medication List    STOP taking these medications        aspirin EC 81 MG tablet     tadalafil 20 MG tablet  Commonly known as:  CIALIS      TAKE these medications         apixaban 5 MG Tabs tablet  Commonly known as:  ELIQUIS  Take 1 tablet (5 mg total) by mouth 2 (two) times daily.     CALCIUM-MAGNESIUM-ZINC PO  Take 2 tablets by mouth daily.     carvedilol 25 MG tablet  Commonly known as:  COREG  Take 0.5 tablets (12.5 mg total) by mouth 2 (two) times daily with a meal.     DEPO-TESTOSTERONE 200 MG/ML  injection  Generic drug:  testosterone cypionate  Inject into the muscle every 14 (fourteen) days. INJECT 0.6ML- 0.8ML EVERY 2 WEEKS     Fish Oil 1000 MG Caps  Take 1 capsule by mouth 2 (two) times daily.     furosemide 40 MG tablet  Commonly known as:  LASIX  Take 40 mg by mouth. Per pt he is still taking and will take 60mg  if needed     glipiZIDE 10 MG 24 hr tablet  Commonly known as:  GLUCOTROL XL  Take 10 mg by mouth 2 times daily at 12 noon and 4 pm. Pt takes it in the AM and PM     hydrALAZINE 25 MG tablet  Commonly known as:  APRESOLINE  Take 1 tablet (25 mg total) by mouth 2 (two) times daily.     HYDROcodone-acetaminophen 10-325 MG tablet  Commonly known as:  NORCO  Take 1 tablet by mouth every 6 (six) hours as needed for moderate pain.     isosorbide mononitrate 30 MG 24 hr tablet  Commonly known as:  IMDUR  Take 1 tablet (30 mg total) by mouth daily.     losartan 25 MG tablet  Commonly known as:  COZAAR  Take 1 tablet (25 mg total) by mouth daily.     multivitamins ther. w/minerals Tabs tablet  Take 1 tablet by mouth daily.     omeprazole 20 MG capsule  Commonly known as:  PRILOSEC  Take 20 mg by mouth daily.     rosuvastatin 20 MG tablet  Commonly known as:  CRESTOR  Take 20 mg by mouth every evening.     TOUJEO SOLOSTAR 300 UNIT/ML Sopn  Generic drug:  Insulin Glargine  Inject 15 Units as directed daily.         Outstanding Labs/Studies  1. BMET in 1 week and 3 weeks   Duration of Discharge Encounter: Greater than 30 minutes including physician and PA time.  Signed, Tereso Newcomer, PA-C     07/11/2015 4:45 PM     Addendum 07/11/2015 4:45 PM Pharmacy called me regarding reported allergies ACE inhibitor allergy listed as angioedema in the PMH. I called patient at home.  He recalls going to the ED years ago for what sounds like angioedema. He thinks it may have been an ACE inhibitor. He was tolerating Losartan without any issues for years until he was admitted with a GI bleed summer of 2015. Losartan was held then due to worsening renal function in the setting of GI bleeding. I advised the patient to NOT take Lisinopril. I will send a Rx to his pharmacy for Losartan 25 mg QD.  His pharmacy will not reopen until Monday.  He can start the medication at that time. Tereso Newcomer, PA-C   07/11/2015 4:45 PM

## 2015-07-11 NOTE — Progress Notes (Signed)
Order for discharge received.  Pt's wife states Pt had just c/o a cold chill.  VS checked and stable.  CBG checked with result of 50.  Pt requests glass of OJ.  OJ given.  Telemetry box removed and CCMD notified.  IV removed with catheter intact.  Education provided on new medications.  Pt, wife and daughter indicate understanding.  All questions answered.  Pt discharged via WC by this RN.  Pt denies SOB or chest pain. Pt denies any further S/S of low blood sugar.  Pt stable at discharge with no S/S of distress.

## 2015-07-11 NOTE — Progress Notes (Signed)
Patient Name: Wayne Gordon      SUBJECTIVE: without complaint of sob or cp or palps  Past Medical History  Diagnosis Date  . Hypertension   . Hyperlipidemia   . Heart murmur   . Shortness of breath     "lying down & w/exertion"  . Diabetes mellitus   . GERD (gastroesophageal reflux disease)   . Arthritis   . Chronic systolic heart failure (HCC)   . Mitral regurgitation     S/P mitral valve repair [333093][  . Ischemic cardiomyopathy     S/P CABG x 3; EF 25%  . Carotid artery disease (HCC)     60-79% LICA  . Allergy to ACE inhibitors     Angioedema  . Renal insufficiency   . Coronary artery disease   . Myocardial infarction (HCC)   . Dysrhythmia     heart block  . ICD (implantable cardiac defibrillator) in place 03/28/2012  . CHF (congestive heart failure) (HCC)     Scheduled Meds:  Scheduled Meds: . apixaban  5 mg Oral BID  . carvedilol  12.5 mg Oral BID WC  . furosemide  40 mg Oral Daily  . glipiZIDE  10 mg Oral BID WC  . hydrALAZINE  25 mg Oral BID  . insulin aspart  0-15 Units Subcutaneous TID WC  . insulin glargine  15 Units Subcutaneous Daily  . isosorbide mononitrate  30 mg Oral Daily  . pantoprazole  40 mg Oral Daily  . rosuvastatin  20 mg Oral QPM   Continuous Infusions:  acetaminophen, HYDROcodone-acetaminophen, ondansetron (ZOFRAN) IV, perflutren lipid microspheres (DEFINITY) IV suspension, zolpidem    PHYSICAL EXAM Filed Vitals:   07/10/15 1519 07/10/15 1555 07/10/15 2059 07/11/15 0420  BP: 145/79 153/84 126/73 127/73  Pulse: 67 70 96 68  Temp:  98 F (36.7 C) 98.2 F (36.8 C) 98.1 F (36.7 C)  TempSrc:  Oral Oral Oral  Resp: Height:   (1.956 m)    Weight:  240 lb 4.8 oz (109 kg)    SpO2: 97% 96% 96% 95%   Well developed and nourished in no acute distress HENT normal Neck supple with JVP-flat Clear Regular rate and rhythm, no murmurs or gallops Abd-soft with active BS No Clubbing cyanosis  edema Skin-warm and dry A & Oriented  Grossly normal sensory and motor function  TELEMETRY: Reviewed telemetry pt in * nsr    Intake/Output Summary (Last 24 hours) at 07/11/15 1127 Last data filed at 07/10/15 1800  Gross per 24 hour  Intake    480 ml  Output      0 ml  Net    480 ml    LABS: Basic Metabolic Panel:  Recent Labs Lab 07/10/15 0951 07/10/15 1840 07/11/15 0353  NA 140  --  142  K 3.7  --  3.6  CL 103  --  103  CO2 31  --  32  GLUCOSE 136*  --  126*  BUN 19  --  19  CREATININE 1.67*  --  1.56*  CALCIUM 8.9  --  8.9  MG  --  2.0  --    Cardiac Enzymes: No results for input(s): CKTOTAL, CKMB, CKMBINDEX, TROPONINI in the last 72 hours. CBC:  Recent Labs Lab 07/10/15 0951 07/11/15 0353  WBC 6.6 6.0  HGB 15.3 14.2  HCT 44.8 42.4  MCV 94.5 95.5  PLT 194 187   PROTIME:  Recent Labs  07/10/15 0951 07/11/15 0353  LABPROT 14.6 16.2*  INR 1.13 1.29   Liver Function Tests: No results for input(s): AST, ALT, ALKPHOS, BILITOT, PROT, ALBUMIN in the last 72 hours. No results for input(s): LIPASE, AMYLASE in the last 72 hours. BNP: BNP (last 3 results)  Recent Labs  09/02/14 0900  BNP 435.0*    ProBNP (last 3 results) No results for input(s): PROBNP in the last 8760 hours.  D-Dimer: No results for input(s): DDIMER in the last 72 hours. Hemoglobin A1C: No results for input(s): HGBA1C in the last 72 hours. Fasting Lipid Panel:  Recent Labs  07/11/15 0353  CHOL 156  HDL 25*  LDLCALC 103*  TRIG 142  CHOLHDL 6.2   Thyroid Function Tests: No results for input(s): TSH, T4TOTAL, T3FREE, THYROIDAB in the last 72 hours.  Invalid input(s): FREET3 Anemia Panel: No results for input(s): VITAMINB12, FOLATE, FERRITIN, TIBC, IRON, RETICCTPCT in the last 72 hours.   Device Interrogation: device reprogramemd  Echo ef 30-40 prelim  ASSESSMENT AND PLAN:  Active Problems:   Ventricular arrhythmia  Added nitrates to hydralazine;    Does NOT  use Cialis Although renal function would prob allow resumption of ACE  Will start and will need BMET in 1 and 3 weeks sent to [facility] No driving x 6 months  Signed, Sherryl Manges MD  07/11/2015

## 2015-07-13 ENCOUNTER — Telehealth: Payer: Self-pay | Admitting: Physician Assistant

## 2015-07-13 ENCOUNTER — Other Ambulatory Visit: Payer: Self-pay | Admitting: Physician Assistant

## 2015-07-13 DIAGNOSIS — N289 Disorder of kidney and ureter, unspecified: Secondary | ICD-10-CM

## 2015-07-13 NOTE — Telephone Encounter (Signed)
Please schedule the patient to come in for his labs, on 07/17/15 and 07/31/15, both for BMET.  Also please call the patient to let him know.  Thank you  Francis Dowse, PA-C

## 2015-07-16 ENCOUNTER — Telehealth: Payer: Self-pay | Admitting: *Deleted

## 2015-07-16 NOTE — Telephone Encounter (Signed)
I called Wayne Gordon to schedule bmet this week and again 3 week. Wayne Gordon states he is having bmet with PCP tomorrow as well as in 3 weeks again. Wayne Gordon states he will have results faxed over to Dr. Ladona Ridgel. I said that will be fine.

## 2015-07-16 NOTE — Telephone Encounter (Signed)
[address] [place], [place]   07/16/2015 7:47 PM

## 2015-07-20 ENCOUNTER — Encounter: Payer: Self-pay | Admitting: Internal Medicine

## 2015-07-27 NOTE — Progress Notes (Addendum)
Cardiology Office Note Date:  07/28/2015  Patient ID:  Wayne Gordon, DOB [birth year], MRN [id number] PCP:  Samuel Jester, DO  Cardiologist:  Dr. Ladona Ridgel   Chief Complaint: post hospital stay visit  History of Present Illness: Wayne Gordon is a 81 y.o. male with history of CAD, ICM/ICD, VT, HTN, HLD, PAFib.    He was hospitalized at [facility] 07/10/15 after a syncopal event secondary to VT that he received therapy for.  His hospital record is  Reviewed, device interrogation there showed he had prior VT with successful ATP but on this occasion developed ventricular tachycardia that accelerated into the ventricular fibrillation zone. Because of the acceleration of greater than 12%, ATP did not occur. The patient was shocked 3 occasions at 30 J at 14 J and 40 J with reverse polarity prior to restoration of sinus rhythm.  The patient was also noted to have complete heart block prior to and following ICD shock. Interrogation of the histogram suggested that heart block is present 10-15% of the time. The device was reprogrammed to a 2 zone device which will force ATP in zone one. This will be 180--210. Because of failed shocks, all shocks will be programmed at maximum output.  AAD therapy was not initiated to avoid aggravating his CHB and minimize RV pacing.  He was observed overnight and discharged the following day.  During his stay, it was felt that his renal function was stable and his losartan was resumed to f/u labs outpatient.  Today he comes in feeling well, he denies any CP, palpitations, no dizziness, near syncope or syncope.  He noted his BP a bit low and self decreased the PM dose of hydralazine to 1/2 tab and reports his BP since 109-110 SBP on the low end, 120's otherwise.  He has not had any further shocks from his device.  He states he has gotten back to his usual activities and exercise, feeling well, sleeping well.  No DOE or orthopnea.  No bleeding or signs of bleeding.   Past Medical  History  Diagnosis Date  . Hypertension   . Hyperlipidemia   . Heart murmur   . Shortness of breath     "lying down & w/exertion"  . Diabetes mellitus   . GERD (gastroesophageal reflux disease)   . Arthritis   . Chronic systolic heart failure (HCC)   . Mitral regurgitation     S/P mitral valve repair [[id number]][  . Ischemic cardiomyopathy     S/P CABG x 3; EF 25%  . Carotid artery disease (HCC)     60-79% LICA  . Allergy to ACE inhibitors     Angioedema many years ago; patient has tolerated Losartan (ARB) in the past - it was held during [facility] for GI bleed and worsening renal function >> resume Losartan 25 mg QD 06/2015  . Renal insufficiency   . Coronary artery disease   . Myocardial infarction (HCC)   . Dysrhythmia     heart block  . ICD (implantable cardiac defibrillator) in place 03/28/2012  . CHF (congestive heart failure) (HCC)     Biotronik implanted 03/28/2012, Dr. Ladona Ridgel    Past Surgical History  Procedure Laterality Date  . Knee arthroscopy  ~ 2008    right  . Nasal sinus surgery  1990's    right  . Mitral valve repair  10/17/2011    Procedure: MITRAL VALVE REPAIR (MVR);  Surgeon: Purcell Nails, MD;  Location: [facility] OR;  Service: Open Heart Surgery;  Laterality: N/A;  . Coronary artery bypass graft  10/17/2011    Procedure: CORONARY ARTERY BYPASS GRAFTING (CABG);  Surgeon: Purcell Nails, MD;  Location: [facility] OR;  Service: Open Heart Surgery;  Laterality: N/A;  . Icd  03/28/2012  . Colonoscopy N/A 04/24/2014    RMR Pancolonic diverticulosis  . Esophagogastroduodenoscopy N/A 04/24/2014    RMR Subtle nodularity the gastric mucosa of uncertain significance-status post biopsy. Hiatal hernia. chronic inflammation, no H.pylori  . Left and right heart catheterization with coronary angiogram N/A 10/12/2011    Procedure: LEFT AND RIGHT HEART CATHETERIZATION WITH CORONARY ANGIOGRAM;  Surgeon: Iran Ouch, MD;  Location: [facility] CATH LAB;  Service: Cardiovascular;  Laterality: N/A;    . Implantable cardioverter defibrillator implant N/A 03/28/2012    Procedure: IMPLANTABLE CARDIOVERTER DEFIBRILLATOR IMPLANT;  Surgeon: Marinus Maw, MD;  Location: [facility] CATH LAB;  Service: Cardiovascular;  Laterality: N/A;    Current Outpatient Prescriptions  Medication Sig Dispense Refill  . apixaban (ELIQUIS) 5 MG TABS tablet Take 1 tablet (5 mg total) by mouth 2 (two) times daily. 60 tablet 6  . CALCIUM-MAGNESIUM-ZINC PO Take 2 tablets by mouth daily.    . carvedilol (COREG) 25 MG tablet Take 0.5 tablets (12.5 mg total) by mouth 2 (two) times daily with a meal. 135 tablet 6  . furosemide (LASIX) 40 MG tablet Take 40 mg by mouth. Per pt he is still taking and will take  if needed    . glipiZIDE (GLUCOTROL XL) 10 MG 24 hr tablet Take 10 mg by mouth 2 times daily at 12 noon and 4 pm. Pt takes it in the AM and PM    . hydrALAZINE (APRESOLINE) 25 MG tablet Take 1 tablet (25 mg total) by mouth 2 (two) times daily. (Patient taking differently: Take 25 mg by mouth 2 (two) times daily. TAKE A TABLET IN THE AM AND HALF A TABLET IN THE PM) 270 tablet 3  . HYDROcodone-acetaminophen (NORCO) 10-325 MG tablet Take 1 tablet by mouth every 6 (six) hours as needed for moderate pain.     . Insulin Glargine (TOUJEO SOLOSTAR) 300 UNIT/ML SOPN Inject 15 Units as directed daily.     . isosorbide mononitrate (IMDUR) 30 MG 24 hr tablet Take 1 tablet (30 mg total) by mouth daily. 30 tablet 11  . losartan (COZAAR) 25 MG tablet Take 1 tablet (25 mg total) by mouth daily. 30 tablet 11  . Multiple Vitamins-Minerals (MULTIVITAMINS THER. W/MINERALS) TABS Take 1 tablet by mouth daily.    . Omega-3 Fatty Acids (FISH OIL) 1000 MG CAPS Take 1 capsule by mouth 2 (two) times daily.    [name] Kitchen omeprazole (PRILOSEC) 20 MG capsule Take 20 mg by mouth daily.     . rosuvastatin (CRESTOR) 20 MG tablet Take 20 mg by mouth every evening.    . testosterone cypionate (DEPO-TESTOSTERONE) 200 MG/ML injection Inject into the muscle every 14  (fourteen) days. INJECT 0.6ML- 0.8ML EVERY 2 WEEKS     No current facility-administered medications for this visit.    Allergies:   Ace inhibitors and Penicillins   Social History:  The patient  reports that he has never smoked. He has never used smokeless tobacco. He reports that he does not drink alcohol or use illicit drugs.   Family History:  The patient's family history includes Cardiomyopathy in his father; Diabetes in his father; Heart disease in his mother.  ROS:  Please see the history of present illness. All other systems are reviewed and otherwise  negative.   PHYSICAL EXAM:  VS:  BP 134/68 mmHg  Pulse 84  Ht 6\' 5"  (1.956 m)  Wt 248 lb (112.492 kg)  BMI 29.40 kg/m2 BMI: Body mass index is 29.4 kg/(m^2). Well nourished, well developed, in no acute distress HEENT: normocephalic, atraumatic Neck: no JVD, carotid bruits or masses Cardiac:  normal S1, S2; RRR; no significant murmurs, no rubs, or gallops Lungs:  clear to auscultation bilaterally, no wheezing, rhonchi or rales Abd: soft, nontender,  + BS MS: no deformity or atrophy Ext: no edema Skin: warm and dry, no rash Neuro:  No gross deficits appreciated Psych: euthymic mood, full affect   ICD site is stable, no tethering or discomfort   EKG:  Done today shows paced rhythm   07/11/15: Echocardiogram Study Conclusions  - Left ventricle: Global Longitudinal LV strain is -10.5% The cavity size was mildly dilated. There was mild focal basal hypertrophy of the septum. Systolic function was moderately reduced. The estimated ejection fraction was in the range of 35% to 40%. Diffuse hypokinesis. There is akinesis of the basalinferior myocardium. There is akinesis of the entireinferolateral myocardium. There is akinesis of the basal-midlateral myocardium. - Aortic valve: Trileaflet; mildly thickened, mildly calcified leaflets. There was trivial regurgitation. - Aorta: Aortic root dimension: 37 mm  (ED). - Aortic root: The aortic root was trivially dilated. - Mitral valve: S/P MV repari with mitral valve ring. There was mild regurgitation. Valve area by continuity equation (using LVOT flow): 1.6 cm^2. - Tricuspid valve: There was mild regurgitation. - Pulmonic valve: There was trivial regurgitation.   Recent Labs: 09/02/2014: ALT 30; B Natriuretic Peptide 435.0* 07/10/2015: Magnesium 2.0 07/11/2015: BUN 19; Creatinine, Ser 1.56*; Hemoglobin 14.2; Platelets 187; Potassium 3.6; Sodium 142  07/11/2015: Cholesterol 156; HDL 25*; LDL Cholesterol 103*; Total CHOL/HDL Ratio 6.2; Triglycerides 142; VLDL 28   Estimated Creatinine Clearance: 53.5 mL/min (by C-G formula based on Cr of 1.56).   Wt Readings from Last 3 Encounters:  07/28/15 248 lb (112.492 kg)  07/10/15 240 lb 4.8 oz (109 kg)  06/04/15 242 lb 9.6 oz (110.043 kg)     Other studies reviewed: Additional studies/records reviewed today include: summarized above  DEVICE HISTORY: Biotronik ICD implanted 2013  ASSESSMENT AND PLAN:  1.   Syncope associated with VT/VF, ICM       No Driving 6 mo post syncope pt re-counseled and aware       no shocks from his device, checked today       He appears compensated, LVEF 35-40% by his echo this month, on BB/ARB, hydralazine/nitrate, diuretic       Weight is up, but exam is negative, and he is wearing heavier clothing today       [place] remote f/u  2. CAD     no anginal symptoms     On BB, statin,  3. PAF     CHADS2Vasc is at least 5     On Eliquis,  Pending lab results from his PMD  4. VHD, s/p MVRepair      Functioning well on echo       Disposition: Lexiscan stress test given his VT episode, and 38mo f/u with Dr. Ladona Ridgel in [place], sooner if needed.  Remote check  10/26/14.  The patient had labs last week with his PMD though we have not gotten the result yet, the patient waited for some time, though needed to leave and we will give him a call once reviewed, he is  scheduled for next  week to have the 3 week lab done via his PMD as well and will ask they be sent to [place] also.  ADDENDUM:  Labs from 07/17/15 are reviewed, renal function, K+ stable (BUN/Creatr 24/1.66, K+ 4.0) when compared to January of this year, no changes, he will have labs as planned next week.  Current medicines are reviewed at length with the patient today.  The patient did not have any concerns regarding medicines.  Judith Blonder, PA-C 07/28/2015 10:31 AM     [facility] [address] [place] [address] [phone] (office)  [phone] (fax)

## 2015-07-28 ENCOUNTER — Encounter: Payer: Self-pay | Admitting: Physician Assistant

## 2015-07-28 ENCOUNTER — Ambulatory Visit (INDEPENDENT_AMBULATORY_CARE_PROVIDER_SITE_OTHER): Payer: Medicare HMO | Admitting: Physician Assistant

## 2015-07-28 ENCOUNTER — Encounter: Payer: Self-pay | Admitting: Internal Medicine

## 2015-07-28 ENCOUNTER — Encounter: Payer: Self-pay | Admitting: Cardiology

## 2015-07-28 VITALS — BP 134/68 | HR 84 | Ht 77.0 in | Wt 248.0 lb

## 2015-07-28 DIAGNOSIS — I442 Atrioventricular block, complete: Secondary | ICD-10-CM

## 2015-07-28 DIAGNOSIS — I5022 Chronic systolic (congestive) heart failure: Secondary | ICD-10-CM | POA: Diagnosis not present

## 2015-07-28 DIAGNOSIS — I779 Disorder of arteries and arterioles, unspecified: Secondary | ICD-10-CM | POA: Insufficient documentation

## 2015-07-28 DIAGNOSIS — I480 Paroxysmal atrial fibrillation: Secondary | ICD-10-CM

## 2015-07-28 DIAGNOSIS — I255 Ischemic cardiomyopathy: Secondary | ICD-10-CM

## 2015-07-28 DIAGNOSIS — I739 Peripheral vascular disease, unspecified: Secondary | ICD-10-CM

## 2015-07-28 DIAGNOSIS — I499 Cardiac arrhythmia, unspecified: Secondary | ICD-10-CM

## 2015-07-28 LAB — CUP PACEART INCLINIC DEVICE CHECK
Date Time Interrogation Session: 20161129153620
HIGH POWER IMPEDANCE MEASURED VALUE: 80 Ohm
Lead Channel Pacing Threshold Amplitude: 0.6 V
Lead Channel Sensing Intrinsic Amplitude: 2.8 mV
Lead Channel Sensing Intrinsic Amplitude: 23.1 mV
Lead Channel Setting Pacing Pulse Width: 0.4 ms
MDC IDC LEAD IMPLANT DT: 20130731
MDC IDC LEAD LOCATION: 753860
MDC IDC LEAD MODEL: 365501
MDC IDC LEAD SERIAL: 10505793
MDC IDC MSMT BATTERY VOLTAGE: 3.12 V
MDC IDC MSMT LEADCHNL RV IMPEDANCE VALUE: 650 Ohm
MDC IDC MSMT LEADCHNL RV PACING THRESHOLD PULSEWIDTH: 0.4 ms
MDC IDC SET LEADCHNL RV SENSING SENSITIVITY: 0.8 mV
MDC IDC STAT BRADY RV PERCENT PACED: 72 %
Pulse Gen Serial Number: 60687186

## 2015-07-28 NOTE — Patient Instructions (Addendum)
Medication Instructions:   CONTINUE SAME MEDICATIONS    If you need a refill on your cardiac medications before your next appointment, please call your pharmacy.  Labwork: NONE ORDER TODAY]   Testing/Procedures:  Your physician has requested that you have a lexiscan myoview. [place] LOCATION  For further information please visit [email or web address] Please follow instruction sheet, as given.    Follow-Up:  3 MONTHS WITH DR Ladona Ridgel IN [place] PHYS DEFIB CK    Any Other Special Instructions Will Be Listed Below (If Applicable).

## 2015-08-05 ENCOUNTER — Telehealth: Payer: Self-pay | Admitting: Internal Medicine

## 2015-08-05 NOTE — Telephone Encounter (Signed)
Samples given to help pt with donut hole/ plus he has to have a stress test done, so that was all of his money for the month.

## 2015-08-05 NOTE — Telephone Encounter (Signed)
Pt in the donut hole w/ his Eliquis and was wondering if we could help him out.

## 2015-08-07 ENCOUNTER — Inpatient Hospital Stay (HOSPITAL_COMMUNITY): Admission: RE | Admit: 2015-08-07 | Payer: Medicare HMO | Source: Ambulatory Visit

## 2015-08-07 ENCOUNTER — Encounter (HOSPITAL_COMMUNITY): Payer: Self-pay

## 2015-08-07 ENCOUNTER — Encounter (HOSPITAL_COMMUNITY)
Admission: RE | Admit: 2015-08-07 | Discharge: 2015-08-07 | Disposition: A | Discharge status: Home / Self Care | Payer: Medicare HMO | Source: Ambulatory Visit | Attending: Physician Assistant | Admitting: Physician Assistant

## 2015-08-07 DIAGNOSIS — I5022 Chronic systolic (congestive) heart failure: Secondary | ICD-10-CM | POA: Insufficient documentation

## 2015-08-07 DIAGNOSIS — I499 Cardiac arrhythmia, unspecified: Secondary | ICD-10-CM | POA: Diagnosis not present

## 2015-08-07 LAB — NM MYOCAR MULTI W/SPECT W/WALL MOTION / EF
CHL CUP NUCLEAR SDS: 3
CHL CUP NUCLEAR SRS: 13
CHL CUP NUCLEAR SSS: 16
CSEPPHR: 85 {beats}/min
LHR: 0.35
LV dias vol: 198 mL
LVSYSVOL: 140 mL
Rest HR: 64 {beats}/min
TID: 0.96

## 2015-08-07 MED ORDER — TECHNETIUM TC 99M SESTAMIBI - CARDIOLITE
10.0000 | Freq: Once | INTRAVENOUS | Status: AC | PRN
  Administered 2015-08-07: 10.8 via INTRAVENOUS

## 2015-08-07 MED ORDER — TECHNETIUM TC 99M SESTAMIBI GENERIC - CARDIOLITE
30.0000 | Freq: Once | INTRAVENOUS | Status: AC | PRN
  Administered 2015-08-07: 32.6 via INTRAVENOUS

## 2015-08-07 MED ORDER — REGADENOSON 0.4 MG/5ML IV SOLN
INTRAVENOUS | Status: AC
  Administered 2015-08-07: 0.4 mg
  Filled 2015-08-07: qty 5

## 2015-08-07 MED ORDER — SODIUM CHLORIDE 0.9 % IJ SOLN
INTRAMUSCULAR | Status: AC
  Filled 2015-08-07: qty 3

## 2015-08-10 ENCOUNTER — Encounter: Payer: Self-pay | Admitting: Internal Medicine

## 2015-08-17 ENCOUNTER — Telehealth: Payer: Self-pay | Admitting: *Deleted

## 2015-08-17 NOTE — Telephone Encounter (Signed)
-----   Message from Renee Lynn Ursuy, PA-C sent at 08/07/2015  3:38 PM EST ----- Please let the patient know that his stress test result shows evidence of his old heart disease, nothing new, to keep his appointment with Dr. Taylor as scheduled. 

## 2015-09-03 ENCOUNTER — Telehealth: Payer: Self-pay | Admitting: *Deleted

## 2015-09-03 NOTE — Telephone Encounter (Signed)
-----   Message from [facility] [place] sent at 08/07/2015  3:38 PM EST ----- Please let the patient know that his stress test result shows evidence of his old heart disease, nothing new, to keep his appointment with Dr. Ladona Ridgel as scheduled.

## 2015-11-06 ENCOUNTER — Ambulatory Visit (INDEPENDENT_AMBULATORY_CARE_PROVIDER_SITE_OTHER): Payer: Medicare HMO | Admitting: Internal Medicine

## 2015-11-06 ENCOUNTER — Encounter: Payer: Self-pay | Admitting: Internal Medicine

## 2015-11-06 VITALS — BP 118/62 | HR 83 | Ht 76.0 in | Wt 253.0 lb

## 2015-11-06 DIAGNOSIS — I472 Ventricular tachycardia, unspecified: Secondary | ICD-10-CM

## 2015-11-06 NOTE — Patient Instructions (Signed)
Your physician wants you to follow-up in: Oct. With Dr. Ladona Ridgel. You will receive a reminder letter in the mail two months in advance. If you don't receive a letter, please call our office to schedule the follow-up appointment.  Your physician recommends that you continue on your current medications as directed. Please refer to the Current Medication list given to you today.  If you need a refill on your cardiac medications before your next appointment, please call your pharmacy.  Thank you for choosing [facility]!

## 2015-11-06 NOTE — Progress Notes (Signed)
HPI [name] returns today for followup. He is a very pleasant 82 year old man with an ischemic cardiomyopathy, chronic well compensated systolic heart failure, mild renal insufficiency, stage II, diabetes, and hypertension. He has done well in the interim, status post ICD implantation. No ICD shocks. No syncope, and no peripheral edema. He does note some increased blood pressures.  He has not had palpitations.  He was hospitalized approx 3 months ago with rapid VT and successfully defibrillated. He has had no recurrent shocks since. Allergies  Allergen Reactions  . Ace Inhibitors Swelling and Other (See Comments)    Kidney issues; angioedema per PMH in chart  . Penicillins Palpitations    Has patient had a PCN reaction causing immediate rash, facial/tongue/throat swelling, SOB or lightheadedness with hypotension: No Has patient had a PCN reaction causing severe rash involving mucus membranes or skin necrosis: No Has patient had a PCN reaction that required hospitalization No Has patient had a PCN reaction occurring within the last 10 years: No If all of the above answers are "NO", then may proceed with Cephalosporin use.     Current Outpatient Prescriptions  Medication Sig Dispense Refill  . apixaban (ELIQUIS) 5 MG TABS tablet Take 1 tablet (5 mg total) by mouth 2 (two) times daily. 60 tablet 6  . CALCIUM-MAGNESIUM-ZINC PO Take 2 tablets by mouth daily.    . carvedilol (COREG) 25 MG tablet Take 0.5 tablets (12.5 mg total) by mouth 2 (two) times daily with a meal. 135 tablet 6  . furosemide (LASIX) 40 MG tablet Take 40 mg by mouth. Per pt he is still taking and will take  if needed    . glipiZIDE (GLUCOTROL XL) 10 MG 24 hr tablet Take 10 mg by mouth 2 times daily at 12 noon and 4 pm. Pt takes it in the AM and PM    . hydrALAZINE (APRESOLINE) 25 MG tablet Take 1 tablet (25 mg total) by mouth 2 (two) times daily. (Patient taking differently: Take 25 mg by mouth 2 (two) times daily. TAKE A  TABLET IN THE AM AND HALF A TABLET IN THE PM) 270 tablet 3  . HYDROcodone-acetaminophen (NORCO) 10-325 MG tablet Take 1 tablet by mouth every 6 (six) hours as needed for moderate pain.     . Insulin Glargine (TOUJEO SOLOSTAR) 300 UNIT/ML SOPN Inject 15 Units as directed daily.     . isosorbide mononitrate (IMDUR) 30 MG 24 hr tablet Take 1 tablet (30 mg total) by mouth daily. 30 tablet 11  . losartan (COZAAR) 25 MG tablet Take 1 tablet (25 mg total) by mouth daily. 30 tablet 11  . Multiple Vitamins-Minerals (MULTIVITAMINS THER. W/MINERALS) TABS Take 1 tablet by mouth daily.    . Omega-3 Fatty Acids (FISH OIL) 1000 MG CAPS Take 1 capsule by mouth 2 (two) times daily.    [name] Kitchen omeprazole (PRILOSEC) 20 MG capsule Take 20 mg by mouth daily.     . rosuvastatin (CRESTOR) 20 MG tablet Take 20 mg by mouth every evening.    . testosterone cypionate (DEPO-TESTOSTERONE) 200 MG/ML injection Inject into the muscle every 14 (fourteen) days. INJECT 0.6ML- 0.8ML EVERY 2 WEEKS     No current facility-administered medications for this visit.     Past Medical History  Diagnosis Date  . Hypertension   . Hyperlipidemia   . Diabetes mellitus   . GERD (gastroesophageal reflux disease)   . Arthritis   . Mitral regurgitation     S/P mitral valve repair 2013  .  Ischemic cardiomyopathy     S/P CABG x 3; EF 25%  . Carotid artery disease (HCC)     60-79% LICA  . Allergy to ACE inhibitors     Angioedema many years ago; patient has tolerated Losartan (ARB) in the past - it was held during [facility] for GI bleed and worsening renal function >> resume Losartan 25 mg QD 06/2015  . Renal insufficiency   . Coronary artery disease   . Myocardial infarction (HCC)   . Atrial fibrillation (HCC)     PAF, CHADs2Vasc = 5  . ICD (implantable cardiac defibrillator) in place 03/28/2012    Biotronik, Dr. Ladona Ridgel 03/28/12  . CHF (congestive heart failure) (HCC)     ROS:   All systems reviewed and negative except as noted in the  HPI.   Past Surgical History  Procedure Laterality Date  . Knee arthroscopy  ~ 2008    right  . Nasal sinus surgery  1990's    right  . Mitral valve repair  10/17/2011    Procedure: MITRAL VALVE REPAIR (MVR);  Surgeon: Purcell Nails, MD;  Location: [facility] OR;  Service: Open Heart Surgery;  Laterality: N/A;  . Coronary artery bypass graft  10/17/2011    Procedure: CORONARY ARTERY BYPASS GRAFTING (CABG);  Surgeon: Purcell Nails, MD;  Location: [facility] OR;  Service: Open Heart Surgery;  Laterality: N/A;  . Icd  03/28/2012  . Colonoscopy N/A 04/24/2014    RMR Pancolonic diverticulosis  . Esophagogastroduodenoscopy N/A 04/24/2014    RMR Subtle nodularity the gastric mucosa of uncertain significance-status post biopsy. Hiatal hernia. chronic inflammation, no H.pylori  . Left and right heart catheterization with coronary angiogram N/A 10/12/2011    Procedure: LEFT AND RIGHT HEART CATHETERIZATION WITH CORONARY ANGIOGRAM;  Surgeon: Iran Ouch, MD;  Location: [facility] CATH LAB;  Service: Cardiovascular;  Laterality: N/A;  . Implantable cardioverter defibrillator implant N/A 03/28/2012    Procedure: IMPLANTABLE CARDIOVERTER DEFIBRILLATOR IMPLANT;  Surgeon: Marinus Maw, MD;  Location: [facility] CATH LAB;  Service: Cardiovascular;  Laterality: N/A;     Family History  Problem Relation Age of Onset  . Heart disease Mother   . Diabetes Father   . Cardiomyopathy Father      Social History   Social History  . Marital Status: Married    Spouse Name: N/A  . Number of Children: N/A  . Years of Education: N/A   Occupational History  . Retired    Social History Main Topics  . Smoking status: Never Smoker   . Smokeless tobacco: Never Used     Comment: Never smoked  . Alcohol Use: No  . Drug Use: No  . Sexual Activity: Yes   Other Topics Concern  . Not on file   Social History Narrative     BP 118/62 mmHg  Pulse 83  Ht 6\' 4"  (1.93 m)  Wt 253 lb (114.76 kg)  BMI 30.81 kg/m2  SpO2  95%  Physical Exam:  Well appearing 82 year old man, NAD HEENT: Unremarkable Neck:  6 cm JVD, no thyromegally Lungs:  Clear with no wheezes, rales, or rhonchi. HEART:  IRegular rate rhythm, no murmurs, no rubs, no clicks Abd:  soft, positive bowel sounds, no organomegally, no rebound, no guarding Ext:  2 plus pulses, no edema, no cyanosis, no clubbing Skin:  No rashes no nodules Neuro:  CN II through XII intact, motor grossly intact  DEVICE  Normal device function.  See PaceArt for details.     Assess/Plan:  1. VT/VF - he has had no recurrent VT or VF. He will continue his current meds. If he were to have more, then we would consider adding an AA drug. 2. AV block - he has a VDD ICD in place and his intrinsic PR is about 360 and as a result he is pacing 77% of the time. I have reprogrammed his device to VVI to minimize ventricular pacing 3. ICD - his Biotronik VDD device is working normally.  4. Chronic systolic heart failure - his symptoms remain class 2. No medication changes.  Leonia Reeves.D.

## 2015-11-23 LAB — CUP PACEART INCLINIC DEVICE CHECK
Implantable Lead Implant Date: 20130731
MDC IDC LEAD LOCATION: 753860
MDC IDC LEAD MODEL: 365501
MDC IDC LEAD SERIAL: 10505793
MDC IDC SESS DTM: 20170327133909
MDC IDC SET LEADCHNL RV PACING PULSEWIDTH: 0.4 ms
MDC IDC SET LEADCHNL RV SENSING SENSITIVITY: 0.8 mV
Pulse Gen Serial Number: 60687186

## 2015-12-01 ENCOUNTER — Telehealth: Payer: Self-pay | Admitting: *Deleted

## 2015-12-01 ENCOUNTER — Encounter: Payer: Self-pay | Admitting: Internal Medicine

## 2015-12-01 ENCOUNTER — Ambulatory Visit (INDEPENDENT_AMBULATORY_CARE_PROVIDER_SITE_OTHER): Payer: Medicare HMO | Admitting: *Deleted

## 2015-12-01 DIAGNOSIS — Z95810 Presence of automatic (implantable) cardiac defibrillator: Secondary | ICD-10-CM

## 2015-12-01 LAB — CUP PACEART INCLINIC DEVICE CHECK
Brady Statistic RV Percent Paced: 13 %
Date Time Interrogation Session: 20170404140049
HighPow Impedance: 86 Ohm
Implantable Lead Implant Date: 20130731
Implantable Lead Location: 753860
Implantable Lead Model: 365501
Implantable Lead Serial Number: 10505793
Lead Channel Sensing Intrinsic Amplitude: 17.2 mV
Lead Channel Sensing Intrinsic Amplitude: 5.3 mV
Lead Channel Setting Sensing Sensitivity: 0.8 mV
MDC IDC MSMT BATTERY VOLTAGE: 3.13 V
MDC IDC MSMT LEADCHNL RV IMPEDANCE VALUE: 549 Ohm
MDC IDC MSMT LEADCHNL RV PACING THRESHOLD AMPLITUDE: 0.6 V
MDC IDC MSMT LEADCHNL RV PACING THRESHOLD PULSEWIDTH: 0.4 ms
MDC IDC PG SERIAL: 60687186
MDC IDC SET LEADCHNL RV PACING PULSEWIDTH: 0.4 ms

## 2015-12-01 NOTE — Progress Notes (Signed)
Patient presents to device clinic for reprogramming due symptomatic bradycardia. Reprogrammed to VDD 55bpm per GT. ROV with GT in [place] 02/05/16 to re-evaluate with V pacing/discuss device upgrade.

## 2015-12-01 NOTE — Telephone Encounter (Signed)
Reviewed situation with Dr. Ladona Ridgel- will reprogram back to VDD 55 today/tomorrow and Dr. Ladona Ridgel would like to see the patient back in [place] in 2 months to assess symptoms and discuss potential upgrade to a dual chamber device.  Patient agreeable with plan- will come today for reprogramming. Scheduled 02/05/16 to see GT in [place].

## 2015-12-01 NOTE — Telephone Encounter (Signed)
[name] calling about low heart rates recorded by home equipment. He does have a history of atrial fibrillation and is on eliquis. I instructed him to take his radial pulse for a full minute- he did and counted an irregular heart rate of 47bpm. He reports that his BP has been stable. He feels fine while he is resting but when he stands or is active he feels dizzy/light-headed. He was seen in [place] by Dr. Ladona Ridgel and his device was reprogrammed to VVI 40bpm at that time to minimize ventricular pacing. I will review situation with Dr. Ladona Ridgel and call the patient back with recommendations- he is agreeable.

## 2015-12-08 ENCOUNTER — Telehealth: Payer: Self-pay

## 2015-12-08 NOTE — Telephone Encounter (Signed)
Requested tier exception from [facility] insurance case worker "Dora" as pt cannot afford $47 month for Eliquis. I called [phone] and am told they have 24-48 hr turn around time

## 2015-12-09 ENCOUNTER — Telehealth: Payer: Self-pay

## 2015-12-09 NOTE — Telephone Encounter (Signed)
Pt approved for Tier exception thru 08/28/16 for Eliquis,copay monthly should go from $47 monthly to free,pt made aware

## 2015-12-09 NOTE — Telephone Encounter (Signed)
Eliquis 5 mg approved for Tier Reduction. Good through 08/28/2016.

## 2016-02-05 ENCOUNTER — Encounter: Payer: Self-pay | Admitting: Internal Medicine

## 2016-02-05 ENCOUNTER — Ambulatory Visit (INDEPENDENT_AMBULATORY_CARE_PROVIDER_SITE_OTHER): Payer: Medicare HMO | Admitting: Internal Medicine

## 2016-02-05 VITALS — BP 128/68 | HR 75 | Ht 77.0 in | Wt 250.0 lb

## 2016-02-05 DIAGNOSIS — I5022 Chronic systolic (congestive) heart failure: Secondary | ICD-10-CM | POA: Diagnosis not present

## 2016-02-05 LAB — CUP PACEART INCLINIC DEVICE CHECK
Battery Voltage: 3.12 V
HIGH POWER IMPEDANCE MEASURED VALUE: 82 Ohm
Implantable Lead Implant Date: 20130731
Implantable Lead Location: 753860
Implantable Lead Model: 365501
Lead Channel Impedance Value: 606 Ohm
Lead Channel Sensing Intrinsic Amplitude: 22 mV
Lead Channel Sensing Intrinsic Amplitude: 4.3 mV
Lead Channel Setting Pacing Pulse Width: 0.4 ms
Lead Channel Setting Sensing Sensitivity: 0.8 mV
MDC IDC LEAD SERIAL: 10505793
MDC IDC MSMT BATTERY REMAINING PERCENTAGE: 80 %
MDC IDC MSMT LEADCHNL RV PACING THRESHOLD AMPLITUDE: 0.5 V
MDC IDC MSMT LEADCHNL RV PACING THRESHOLD PULSEWIDTH: 0.4 ms
MDC IDC SESS DTM: 20170609103044
Pulse Gen Serial Number: 60687186

## 2016-02-05 NOTE — Patient Instructions (Signed)
Your physician wants you to follow-up in: 1 Year with Dr. Ladona Ridgel. You will receive a reminder letter in the mail two months in advance. If you don't receive a letter, please call our office to schedule the follow-up appointment.  Your physician recommends that you continue on your current medications as directed. Please refer to the Current Medication list given to you today.  Remote monitoring is used to monitor your Pacemaker of ICD from home. This monitoring reduces the number of office visits required to check your device to one time per year. It allows [place] to keep an eye on the functioning of your device to ensure it is working properly. You are scheduled for a device check from home on 05/09/16. You may send your transmission at any time that day. If you have a wireless device, the transmission will be sent automatically. After your physician reviews your transmission, you will receive a postcard with your next transmission date.  If you need a refill on your cardiac medications before your next appointment, please call your pharmacy.  Thank you for choosing [facility]!

## 2016-02-05 NOTE — Progress Notes (Signed)
HPI Mr. Holley returns today for followup. He is a very pleasant 82 year old man with an ischemic cardiomyopathy, chronic well compensated systolic heart failure, mild renal insufficiency, stage II, diabetes, and hypertension. He has done well in the interim, status post ICD implantation. No ICD shocks. No syncope, and no peripheral edema. He does note dizziness on hydralazine and had to stop this medication. His blood pressure improved.  He has not had palpitations.  He has had no recurrent shocks since his last visit. Allergies  Allergen Reactions  . Ace Inhibitors Swelling and Other (See Comments)    Kidney issues; angioedema per PMH in chart  . Penicillins Palpitations    Has patient had a PCN reaction causing immediate rash, facial/tongue/throat swelling, SOB or lightheadedness with hypotension: No Has patient had a PCN reaction causing severe rash involving mucus membranes or skin necrosis: No Has patient had a PCN reaction that required hospitalization No Has patient had a PCN reaction occurring within the last 10 years: No If all of the above answers are "NO", then may proceed with Cephalosporin use.     Current Outpatient Prescriptions  Medication Sig Dispense Refill  . apixaban (ELIQUIS) 5 MG TABS tablet Take 1 tablet (5 mg total) by mouth 2 (two) times daily. 60 tablet 6  . CALCIUM-MAGNESIUM-ZINC PO Take 2 tablets by mouth daily.    . carvedilol (COREG) 25 MG tablet Take 0.5 tablets (12.5 mg total) by mouth 2 (two) times daily with a meal. 135 tablet 6  . furosemide (LASIX) 40 MG tablet Take 40 mg by mouth. Per pt he is still taking and will take 60mg  if needed    . glipiZIDE (GLUCOTROL XL) 10 MG 24 hr tablet Take 10 mg by mouth 2 times daily at 12 noon and 4 pm. Pt takes it in the AM and PM    . HYDROcodone-acetaminophen (NORCO) 10-325 MG tablet Take 1 tablet by mouth every 6 (six) hours as needed for moderate pain. Reported on 02/05/2016    . Insulin Glargine (TOUJEO SOLOSTAR) 300  UNIT/ML SOPN Inject 12 Units as directed daily.     . isosorbide mononitrate (IMDUR) 30 MG 24 hr tablet Take 1 tablet (30 mg total) by mouth daily. 30 tablet 11  . losartan (COZAAR) 25 MG tablet Take 1 tablet (25 mg total) by mouth daily. 30 tablet 11  . Multiple Vitamins-Minerals (MULTIVITAMINS THER. W/MINERALS) TABS Take 1 tablet by mouth daily.    . Omega-3 Fatty Acids (FISH OIL) 1000 MG CAPS Take 1 capsule by mouth 2 (two) times daily.    [name] Kitchen omeprazole (PRILOSEC) 20 MG capsule Take 20 mg by mouth daily.     . rosuvastatin (CRESTOR) 20 MG tablet Take 20 mg by mouth every evening.    . testosterone cypionate (DEPO-TESTOSTERONE) 200 MG/ML injection Inject 0.5 mg into the muscle every 21 ( twenty-one) days. INJECT 0.6ML- 0.8ML EVERY 2 WEEKS     No current facility-administered medications for this visit.     Past Medical History  Diagnosis Date  . Hypertension   . Hyperlipidemia   . Diabetes mellitus   . GERD (gastroesophageal reflux disease)   . Arthritis   . Mitral regurgitation     S/P mitral valve repair 2013  . Ischemic cardiomyopathy     S/P CABG x 3; EF 25%  . Carotid artery disease (HCC)     60-79% LICA  . Allergy to ACE inhibitors     Angioedema many years ago; patient has tolerated Losartan (ARB)  in the past - it was held during [facility] for GI bleed and worsening renal function >> resume Losartan 25 mg QD 06/2015  . Renal insufficiency   . Coronary artery disease   . Myocardial infarction (HCC)   . Atrial fibrillation (HCC)     PAF, CHADs2Vasc = 5  . ICD (implantable cardiac defibrillator) in place 03/28/2012    Biotronik, Dr. Ladona Ridgel 03/28/12  . CHF (congestive heart failure) (HCC)     ROS:   All systems reviewed and negative except as noted in the HPI.   Past Surgical History  Procedure Laterality Date  . Knee arthroscopy  ~ 2008    right  . Nasal sinus surgery  1990's    right  . Mitral valve repair  10/17/2011    Procedure: MITRAL VALVE REPAIR (MVR);   Surgeon: Purcell Nails, MD;  Location: [facility] OR;  Service: Open Heart Surgery;  Laterality: N/A;  . Coronary artery bypass graft  10/17/2011    Procedure: CORONARY ARTERY BYPASS GRAFTING (CABG);  Surgeon: Purcell Nails, MD;  Location: [facility] OR;  Service: Open Heart Surgery;  Laterality: N/A;  . Icd  03/28/2012  . Colonoscopy N/A 04/24/2014    RMR Pancolonic diverticulosis  . Esophagogastroduodenoscopy N/A 04/24/2014    RMR Subtle nodularity the gastric mucosa of uncertain significance-status post biopsy. Hiatal hernia. chronic inflammation, no H.pylori  . Left and right heart catheterization with coronary angiogram N/A 10/12/2011    Procedure: LEFT AND RIGHT HEART CATHETERIZATION WITH CORONARY ANGIOGRAM;  Surgeon: Iran Ouch, MD;  Location: [facility] CATH LAB;  Service: Cardiovascular;  Laterality: N/A;  . Implantable cardioverter defibrillator implant N/A 03/28/2012    Procedure: IMPLANTABLE CARDIOVERTER DEFIBRILLATOR IMPLANT;  Surgeon: Marinus Maw, MD;  [facility]: [facility] C[facility] LAB;  Service: Cardiovascular;  Laterality: N/A;     Family History  Problem Relation Age of Onset  . Heart disease Mother   . Diabetes Father   . Cardiomyopathy Father      Social History   Social History  . Marital Status: Married    Spouse Name: N/A  . Number of Children: N/A  . Years of Education: N/A   Occupational History  . Retired    Social History Main Topics  . Smoking status: Never Smoker   . Smokeless tobacco: Never Used     Comment: Never smoked  . Alcohol Use: No  . Drug Use: No  . Sexual Activity: Yes   Other Topics Concern  . Not on file   Social History Narrative     BP 128/68 mmHg  Pulse 75  Ht  (1.956 m)  Wt 250 lb (113.399 kg)  BMI 29.64 kg/m2  SpO2 97%  Physical Exam:  Well appearing 82 year old man, NAD HEENT: Unremarkable Neck:  6 cm JVD, no thyromegally Lungs:  Clear with no wheezes, rales, or rhonchi. HEART:  Regular rate rhythm, no murmurs, no rubs, no  clicks Abd:  soft, positive bowel sounds, no organomegally, no rebound, no guarding Ext:  2 plus pulses, no edema, no cyanosis, no clubbing Skin:  No rashes no nodules Neuro:  CN II through XII intact, motor grossly intact  DEVICE  Normal device function.  See PaceArt for details.     Assess/Plan:  1. VT/VF - he has had no recurrent VT or VF. He will continue his current meds.  2. AV block - he has a VDD ICD in place and his intrinsic PR is about 360 and as a result he is  pacing 77% of the time. His device had previously been reprogrammed to VDD to maximize AV synchrony at the expense of VV synchrony. 3. ICD - his Biotronik VDD device is working normally.  4. Chronic systolic heart failure - his symptoms remain class 2. No medication changes.  Leonia Reeves.D.

## 2016-03-29 ENCOUNTER — Other Ambulatory Visit: Payer: Self-pay | Admitting: Internal Medicine

## 2016-03-29 DIAGNOSIS — I4891 Unspecified atrial fibrillation: Secondary | ICD-10-CM

## 2016-03-29 MED ORDER — APIXABAN 5 MG PO TABS
5.0000 mg | ORAL_TABLET | Freq: Two times a day (BID) | ORAL | 6 refills | Status: DC
Start: 2016-03-29 — End: 2016-06-24

## 2016-03-29 NOTE — Telephone Encounter (Signed)
Refill sent as requested. 

## 2016-04-21 IMAGING — NM NM MYOCAR MULTI W/SPECT W/WALL MOTION & EF
2 series · 12 of 12 positions shown · non-contrast
Comparison: none

[Series 1: rest · 8.28mm/px · 6 of 64 frames shown]
[frame 6/64]
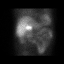
[frame 16/64]
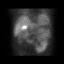
[frame 27/64]
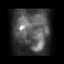
[frame 38/64]
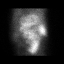
[frame 48/64]
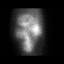
[frame 59/64]
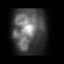

[Series 2: stress gated · 8.28mm/px · 6 of 64 frames shown]
[frame 6/64]
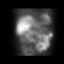
[frame 16/64]
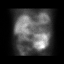
[frame 27/64]
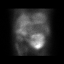
[frame 38/64]
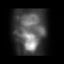
[frame 48/64]
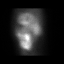
[frame 59/64]
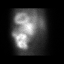

[12 of 12 positions shown; findings below may reference images not displayed]

Canned report from images found in remote index.

Refer to host system for actual result text.

## 2016-05-09 ENCOUNTER — Ambulatory Visit (INDEPENDENT_AMBULATORY_CARE_PROVIDER_SITE_OTHER): Payer: Medicare HMO | Admitting: *Deleted

## 2016-05-09 DIAGNOSIS — I472 Ventricular tachycardia, unspecified: Secondary | ICD-10-CM

## 2016-05-09 DIAGNOSIS — Z95810 Presence of automatic (implantable) cardiac defibrillator: Secondary | ICD-10-CM | POA: Diagnosis not present

## 2016-05-09 NOTE — Progress Notes (Signed)
Remote ICD transmission.   

## 2016-05-12 ENCOUNTER — Encounter: Payer: Self-pay | Admitting: Cardiology

## 2016-05-16 ENCOUNTER — Telehealth: Payer: Self-pay | Admitting: Internal Medicine

## 2016-05-16 NOTE — Telephone Encounter (Signed)
Had 9 hr episode of abdominal pain Friday night, did not go to ED,talked to GI doc he sees for diverticulitis.So Saturday when he awoke,he noted BP 70/40, Hr 70's they called EMS and they got 97/67 and did not transport pt.Pt feels fine now and has apt this week with NP

## 2016-05-19 NOTE — Progress Notes (Signed)
Cardiology Office Note   Date:  05/20/2016   ID:  Wayne Gordon, DOB 02/14/1934, MRN [id number]  PCP:  Samuel JesterYNTHIA [name], DO  Cardiologist:  Dr. Ladona Ridgelaylor     Chief Complaint  Patient presents with  . Hypotension      History of Present Illness: Wayne Gordon is a 82 y.o. male who presents for hypotension & recent abd pain.     He has a history of ischemic cardiomyopathy, chronic well compensated systolic heart failure, mild renal insufficiency, stage II, diabetes, and hypertension, s/p ICD.  VDD.   He denies any chest pain or SOB + cough.  mucinix has helped.  Last Friday he had significant abd pain prior to exercise but it did increase with exercise.  He went home and rested but pain continued.  Lasted 9 hours, and was severe.  Epigastric and across at diaphragm level.By the next day his BP was down to 73 systolic and EMS was called. BP then 97 systolic, pt instructed to decrease BP meds which he did for a few days. Pain was resolved.  Pt did not go to the ER. Now back on regular dose except the lasix still on 20 mg daily.    He and his wife concerned about an aneurysm but on abd CT with last episode 1 year ago he did have extensive aortoiliac atherosclerosis without aneurysm.   His GI provider requested lipase and hepatic panel with these episodes. No further abd pain.  Today he feels like his normal self.  No device shocks.      Past Medical History:  Diagnosis Date  . Allergy to ACE inhibitors    Angioedema many years ago; patient has tolerated Losartan (ARB) in the past - it was held during [facility] for GI bleed and worsening renal function >> resume Losartan 25 mg QD 06/2015  . Arthritis   . Atrial fibrillation (HCC)    PAF, CHADs2Vasc = 5  . Carotid artery disease (HCC)    60-79% LICA  . CHF (congestive heart failure) (HCC)   . Coronary artery disease   . Diabetes mellitus   . GERD (gastroesophageal reflux disease)   . Hyperlipidemia   . Hypertension   . ICD  (implantable cardiac defibrillator) in place 03/28/2012   Biotronik, Dr. Ladona Ridgelaylor 03/28/12  . Ischemic cardiomyopathy    S/P CABG x 3; EF 25%  . Mitral regurgitation    S/P mitral valve repair 2013  . Myocardial infarction (HCC)   . Renal insufficiency     Past Surgical History:  Procedure Laterality Date  . COLONOSCOPY N/A 04/24/2014   RMR Pancolonic diverticulosis  . CORONARY ARTERY BYPASS GRAFT  10/17/2011   Procedure: CORONARY ARTERY BYPASS GRAFTING (CABG);  Surgeon: Purcell Nailslarence H Owen, MD;  [facility];  Service: Open Heart Surgery;  Laterality: N/A;  . ESOPHAGOGASTRODUODENOSCOPY N/A 04/24/2014   RMR Subtle nodularity the gastric mucosa of uncertain significance-status post biopsy. Hiatal hernia. chronic inflammation, no H.pylori  . ICD  03/28/2012  . IMPLANTABLE CARDIOVERTER DEFIBRILLATOR IMPLANT N/A 03/28/2012   Procedure: IMPLANTABLE CARDIOVERTER DEFIBRILLATOR IMPLANT;  Surgeon: Marinus MawGregg W Taylor, MD;  [facility];  Service: Cardiovascular;  Laterality: N/A;  . KNEE ARTHROSCOPY  ~ 2008   right  . LEFT AND RIGHT HEART CATHETERIZATION WITH CORONARY ANGIOGRAM N/A 10/12/2011   Procedure: LEFT AND RIGHT HEART CATHETERIZATION WITH CORONARY ANGIOGRAM;  Surgeon: Iran OuchMuhammad A Arida, MD;  Location: [facility] CATH LAB;  Service: Cardiovascular;  Laterality: N/A;  . MITRAL VALVE  REPAIR  10/17/2011   Procedure: MITRAL VALVE REPAIR (MVR);  Surgeon: Purcell Nails, MD;  Location: [facility] OR;  Service: Open Heart Surgery;  Laterality: N/A;  . NASAL SINUS SURGERY  1990's   right     Current Outpatient Prescriptions  Medication Sig Dispense Refill  . apixaban (ELIQUIS) 5 MG TABS tablet Take 1 tablet (5 mg total) by mouth 2 (two) times daily. 60 tablet 6  . CALCIUM-MAGNESIUM-ZINC PO Take 2 tablets by mouth daily.    . carvedilol (COREG) 25 MG tablet Take 0.5 tablets (12.5 mg total) by mouth 2 (two) times daily with a meal. 135 tablet 6  . furosemide (LASIX) 40 MG tablet Take 40 mg by mouth. Per pt he is  still taking and will take 60mg  if needed    . glipiZIDE (GLUCOTROL XL) 10 MG 24 hr tablet Take 10 mg by mouth 2 times daily at 12 noon and 4 pm. Pt takes it in the AM and PM    . hydrALAZINE (APRESOLINE) 25 MG tablet Take 25 mg by mouth as needed.     [name] Kitchen HYDROcodone-acetaminophen (NORCO) 10-325 MG tablet Take 1 tablet by mouth every 6 (six) hours as needed for moderate pain. Reported on 02/05/2016    . Insulin Glargine (TOUJEO SOLOSTAR) 300 UNIT/ML SOPN Inject 12 Units as directed daily.     . isosorbide mononitrate (IMDUR) 30 MG 24 hr tablet Take 1 tablet (30 mg total) by mouth daily. 30 tablet 11  . losartan (COZAAR) 25 MG tablet Take 1 tablet (25 mg total) by mouth daily. 30 tablet 11  . Multiple Vitamins-Minerals (MULTIVITAMINS THER. W/MINERALS) TABS Take 1 tablet by mouth daily.    . Omega-3 Fatty Acids (FISH OIL) 1000 MG CAPS Take 1 capsule by mouth 2 (two) times daily.    [name] Kitchen omeprazole (PRILOSEC) 20 MG capsule Take 20 mg by mouth daily.     . rosuvastatin (CRESTOR) 20 MG tablet Take 20 mg by mouth every evening.    . testosterone cypionate (DEPO-TESTOSTERONE) 200 MG/ML injection Inject 0.5 mg into the muscle every 21 ( twenty-one) days. INJECT 0.6ML- 0.8ML EVERY 2 WEEKS     No current facility-administered medications for this visit.     Allergies:   Ace inhibitors and Penicillins    Social History:  The patient  reports that he has never smoked. He has never used smokeless tobacco. He reports that he does not drink alcohol or use drugs.   Family History:  The patient's family history includes Cardiomyopathy in his father; Diabetes in his father; Heart disease in his mother.    ROS:  General:no colds or fevers, no weight changes Skin:no rashes or ulcers HEENT:no blurred vision, no congestion CV:see HPI PUL:see HPI GI:no diarrhea constipation or melena, no indigestion GU:no hematuria, no dysuria MS:no joint pain, no claudication Neuro:no syncope, no lightheadedness Endo:+  diabetes, no thyroid disease  Wt Readings from Last 3 Encounters:  05/20/16 245 lb (111.1 kg)  02/05/16 250 lb (113.4 kg)  11/06/15 253 lb (114.8 kg)     PHYSICAL EXAM: VS:  BP 131/83   Pulse 70   Ht 6\' 5"  (1.956 m)   Wt 245 lb (111.1 kg)   SpO2 96%   BMI 29.05 kg/m  , BMI Body mass index is 29.05 kg/m. General:Pleasant affect, NAD Skin:Warm and dry, brisk capillary refill HEENT:normocephalic, sclera clear, mucus membranes moist Neck:supple, no JVD, no bruits  Heart:S1S2 RRR without murmur, gallup, rub or click Lungs:clear without rales, rhonchi, or  wheezes DBZ:MCEY, non tender, + BS, do not palpate liver spleen or masses Ext:no lower ext edema, 2+ pedal pulses, 2+ radial pulses Neuro:alert and oriented, MAE, follows commands, + facial symmetry    EKG:  EKG is ordered today. The ekg ordered today demonstrates SR with V pacing.    Recent Labs: 07/10/2015: Magnesium 2.0 07/11/2015: BUN 19; Creatinine, Ser 1.56; Hemoglobin 14.2; Platelets 187; Potassium 3.6; Sodium 142    Lipid Panel    Component Value Date/Time   CHOL 156 07/11/2015 0353   TRIG 142 07/11/2015 0353   HDL 25 (L) 07/11/2015 0353   CHOLHDL 6.2 07/11/2015 0353   VLDL 28 07/11/2015 0353   LDLCALC 103 (H) 07/11/2015 0353       Other studies Reviewed: Additional studies/ records that were reviewed today include: .  myoview study: 07/2015 Study Result    Defect 1: There is a large defect of severe severity present in the basal inferior, basal inferolateral, basal anterolateral, mid inferior, mid inferolateral, mid anterolateral, apical inferior and apical lateral location.  Findings consistent with prior myocardial infarction.  This is a high risk study.  Nuclear stress EF: 29%. The aforementioned walls are severely hypokinetic to akinetic.     ECHO: 06/2015 Study Conclusions  - Left ventricle: Global Longitudinal LV strain is -10.5% The   cavity size was mildly dilated. There was mild  focal basal   hypertrophy of the septum. Systolic function was moderately   reduced. The estimated ejection fraction was in the range of 35%   to 40%. Diffuse hypokinesis. There is akinesis of the   basalinferior myocardium. There is akinesis of the   entireinferolateral myocardium. There is akinesis of the   basal-midlateral myocardium. - Aortic valve: Trileaflet; mildly thickened, mildly calcified   leaflets. There was trivial regurgitation. - Aorta: Aortic root dimension: 37 mm (ED). - Aortic root: The aortic root was trivially dilated. - Mitral valve: S/P MV repari with mitral valve ring. There was   mild regurgitation. Valve area by continuity equation (using LVOT   flow): 1.6 cm^2. - Tricuspid valve: There was mild regurgitation. - Pulmonic valve: There was trivial regurgitation.  ASSESSMENT AND PLAN:  1.  abd pain with hypotension.  No residual symptoms now.  Does not sound cardiac- same as episode 2015 though this episode lasted longer.  Will check amylase, lipase, BMP and Hepatic panel and in case of bleed on Eliquis will check CBC.  If recurrent abd pain he should go to ER.  No gallstones on CT with last episode, no aneurysm on CT then as well.  EKG without acute changes.  He will follow up with Dr. Ladona Ridgel in 2 months just for recheck.  2. VT/VF - he has had no recurrent VT or VF. He will continue his current meds.  3. AV block - he has a VDD ICD in place and his intrinsic PR is about 360 and as a result he is pacing 77% of the time. His device had previously been reprogrammed to VDD to maximize AV synchrony at the expense of VV synchrony. 4. ICD - his Biotronik VDD device is working normally. Per last check 5. Chronic systolic heart failure due to cardiomyopathy - his symptoms remain class 2. No medication changes. He will resume his 40 mg lasix in 2 days or earlier if any swelling.   Current medicines are reviewed with the patient today.  The patient Has no concerns regarding  medicines.  The following changes have been made:  See  above Labs/ tests ordered today include:see above  Disposition:   FU:  see above  Signed, Nada Boozer, NP  05/20/2016 3:25 PM    [facility] [address] [place], [place], [place]  [address]/ [address] [place], [place] Phone: [phone]; Fax: [phone]  [phone]

## 2016-05-20 ENCOUNTER — Other Ambulatory Visit (HOSPITAL_COMMUNITY)
Admission: RE | Admit: 2016-05-20 | Discharge: 2016-05-20 | Disposition: A | Discharge status: Home / Self Care | Payer: Medicare HMO | Source: Ambulatory Visit | Attending: Cardiology | Admitting: Cardiology

## 2016-05-20 ENCOUNTER — Ambulatory Visit (INDEPENDENT_AMBULATORY_CARE_PROVIDER_SITE_OTHER): Payer: Medicare HMO | Admitting: Cardiology

## 2016-05-20 ENCOUNTER — Encounter: Payer: Self-pay | Admitting: Cardiology

## 2016-05-20 VITALS — BP 131/83 | HR 70 | Ht 77.0 in | Wt 245.0 lb

## 2016-05-20 DIAGNOSIS — Z7901 Long term (current) use of anticoagulants: Secondary | ICD-10-CM

## 2016-05-20 DIAGNOSIS — R1010 Upper abdominal pain, unspecified: Secondary | ICD-10-CM

## 2016-05-20 DIAGNOSIS — R109 Unspecified abdominal pain: Secondary | ICD-10-CM | POA: Insufficient documentation

## 2016-05-20 DIAGNOSIS — R079 Chest pain, unspecified: Secondary | ICD-10-CM | POA: Diagnosis not present

## 2016-05-20 DIAGNOSIS — I499 Cardiac arrhythmia, unspecified: Secondary | ICD-10-CM

## 2016-05-20 DIAGNOSIS — I5022 Chronic systolic (congestive) heart failure: Secondary | ICD-10-CM

## 2016-05-20 DIAGNOSIS — I255 Ischemic cardiomyopathy: Secondary | ICD-10-CM

## 2016-05-20 DIAGNOSIS — I959 Hypotension, unspecified: Secondary | ICD-10-CM

## 2016-05-20 LAB — BASIC METABOLIC PANEL
ANION GAP: 8 (ref 5–15)
BUN: 26 mg/dL — ABNORMAL HIGH (ref 6–20)
CALCIUM: 8.7 mg/dL — AB (ref 8.9–10.3)
CO2: 27 mmol/L (ref 22–32)
Chloride: 101 mmol/L (ref 101–111)
Creatinine, Ser: 1.64 mg/dL — ABNORMAL HIGH (ref 0.61–1.24)
GFR calc non Af Amer: 38 mL/min — ABNORMAL LOW (ref >60)
GFR, EST AFRICAN AMERICAN: 44 mL/min — AB (ref >60)
Glucose, Bld: 110 mg/dL — ABNORMAL HIGH (ref 65–99)
Potassium: 4.1 mmol/L (ref 3.5–5.1)
Sodium: 136 mmol/L (ref 135–145)

## 2016-05-20 LAB — HEPATIC FUNCTION PANEL
ALBUMIN: 3.3 g/dL — AB (ref 3.5–5.0)
ALT: 58 U/L (ref 17–63)
AST: 36 U/L (ref 15–41)
Alkaline Phosphatase: 90 U/L (ref 38–126)
BILIRUBIN DIRECT: 0.2 mg/dL (ref 0.1–0.5)
Indirect Bilirubin: 0.5 mg/dL (ref 0.3–0.9)
Total Bilirubin: 0.7 mg/dL (ref 0.3–1.2)
Total Protein: 7.3 g/dL (ref 6.5–8.1)

## 2016-05-20 LAB — BRAIN NATRIURETIC PEPTIDE: B NATRIURETIC PEPTIDE 5: 384 pg/mL — AB (ref 0.0–100.0)

## 2016-05-20 LAB — LIPASE, BLOOD: LIPASE: 35 U/L (ref 11–51)

## 2016-05-20 LAB — AMYLASE: AMYLASE: 52 U/L (ref 28–100)

## 2016-05-20 NOTE — Patient Instructions (Signed)
Your physician recommends that you schedule a follow-up appointment in: 1-2 Months with Dr. Ladona Ridgel  Your physician recommends that you continue on your current medications as directed. Please refer to the Current Medication list given to you today.  Your physician recommends that you return for lab work today.   If you need a refill on your cardiac medications before your next appointment, please call your pharmacy.  Thank you for choosing [facility]!

## 2016-05-23 ENCOUNTER — Other Ambulatory Visit: Payer: Self-pay | Admitting: *Deleted

## 2016-05-23 DIAGNOSIS — I10 Essential (primary) hypertension: Secondary | ICD-10-CM

## 2016-05-24 LAB — CUP PACEART REMOTE DEVICE CHECK
Brady Statistic RV Percent Paced: 93 %
HIGH POWER IMPEDANCE MEASURED VALUE: 87 Ohm
Implantable Lead Implant Date: 20130731
Lead Channel Impedance Value: 520 Ohm
Lead Channel Pacing Threshold Amplitude: 0.6 V
Lead Channel Sensing Intrinsic Amplitude: 3.8 mV
Lead Channel Sensing Intrinsic Amplitude: 8.9 mV
Lead Channel Setting Pacing Amplitude: 1.6 V
MDC IDC LEAD LOCATION: 753860
MDC IDC LEAD MODEL: 365501
MDC IDC LEAD SERIAL: 10505793
MDC IDC MSMT BATTERY REMAINING PERCENTAGE: 77 %
MDC IDC MSMT BATTERY VOLTAGE: 3.12 V
MDC IDC MSMT LEADCHNL RV PACING THRESHOLD PULSEWIDTH: 0.4 ms
MDC IDC SESS DTM: 20170926094025
MDC IDC SET LEADCHNL RV PACING PULSEWIDTH: 0.4 ms
MDC IDC SET LEADCHNL RV SENSING SENSITIVITY: 0.8 mV
Pulse Gen Serial Number: 60687186

## 2016-05-26 ENCOUNTER — Encounter: Payer: Self-pay | Admitting: Cardiology

## 2016-06-16 ENCOUNTER — Other Ambulatory Visit: Payer: Self-pay | Admitting: Internal Medicine

## 2016-06-16 DIAGNOSIS — I4891 Unspecified atrial fibrillation: Secondary | ICD-10-CM

## 2016-06-20 ENCOUNTER — Telehealth: Payer: Self-pay | Admitting: Internal Medicine

## 2016-06-20 DIAGNOSIS — K625 Hemorrhage of anus and rectum: Secondary | ICD-10-CM

## 2016-06-20 NOTE — Telephone Encounter (Signed)
Patient called asking to leave a message form LSL to call him. He said he thinks he is having another bout with diverticulitis and wanted to know what she would recommend. Please advise [phone]

## 2016-06-20 NOTE — Telephone Encounter (Signed)
I spoke with the pt- he said he started noticing bright red blood with a bm last night and this morning. His bm's are normal and he is not straining. No pain at the moment but he did have some mild lower abd pain earlier but that has gone away. No N/V, no fever, he felt weak this morning but is better now, he feels like his bp was low this morning.  Pt wants to know if he should do any testing? Should he eat anything in particular?  Routing to RMR- LSL has left for the day.

## 2016-06-21 ENCOUNTER — Telehealth: Payer: Self-pay | Admitting: Internal Medicine

## 2016-06-21 ENCOUNTER — Other Ambulatory Visit: Payer: Self-pay

## 2016-06-21 DIAGNOSIS — K625 Hemorrhage of anus and rectum: Secondary | ICD-10-CM

## 2016-06-21 NOTE — Telephone Encounter (Signed)
I would get a CBC today. If ongoing bleeding, may need to go to the ED, if unsure would back off to a clear liquid diet until this episode passes.

## 2016-06-21 NOTE — Telephone Encounter (Signed)
See other phone note

## 2016-06-21 NOTE — Telephone Encounter (Signed)
No Eliquis samples given to pt with rectal bleeding, plus with hypotension,pt encouraged to go to the ED now.Wife reports several days of bright red stools and now it is black stools.Wife states this happened in 2015 but did not know cause.Pt going to ED now and told to HOLD Eliquis.Will FYI Dr Ladona Ridgel

## 2016-06-21 NOTE — Telephone Encounter (Signed)
Patient called this morning and stated that he is still having blood come out but it is darker than yesterday.

## 2016-06-21 NOTE — Telephone Encounter (Signed)
Pt called stating he's having some rectal bleeding since Sunday and he's wanting to know if there's any meds he should hold off taking till he finds out what is causing the bleeding, pt states that his BP is dropping when he stands up. He will be going to have blood work done for Dr. Jena Gauss at [facility] today  Also, pt is in the doughnut hole w/ his Eliquis and he'd like to know if he could get some samples. Please give him a call @ [phone]

## 2016-06-21 NOTE — Telephone Encounter (Signed)
Pt is aware. Lab order done. Went over clear liquid diet with the pt.

## 2016-06-21 NOTE — Addendum Note (Signed)
Addended by: Myra Rude on: 06/21/2016 10:27 AM   Modules accepted: Orders

## 2016-06-22 ENCOUNTER — Telehealth: Payer: Self-pay | Admitting: Internal Medicine

## 2016-06-22 LAB — CBC WITH DIFFERENTIAL/PLATELET
BASOS: 0 %
Basophils Absolute: 0 10*3/uL (ref 0.0–0.2)
EOS (ABSOLUTE): 0.1 10*3/uL (ref 0.0–0.4)
EOS: 1 %
HEMATOCRIT: 36.8 % — AB (ref 37.5–51.0)
HEMOGLOBIN: 12.4 g/dL — AB (ref 12.6–17.7)
IMMATURE GRANS (ABS): 0.1 10*3/uL (ref 0.0–0.1)
IMMATURE GRANULOCYTES: 1 %
LYMPHS: 22 %
Lymphocytes Absolute: 2.6 10*3/uL (ref 0.7–3.1)
MCH: 31.2 pg (ref 26.6–33.0)
MCHC: 33.7 g/dL (ref 31.5–35.7)
MCV: 93 fL (ref 79–97)
MONOCYTES: 7 %
Monocytes Absolute: 0.8 10*3/uL (ref 0.1–0.9)
NEUTROS PCT: 69 %
Neutrophils Absolute: 8.1 10*3/uL — ABNORMAL HIGH (ref 1.4–7.0)
PLATELETS: 231 10*3/uL (ref 150–379)
RBC: 3.98 x10E6/uL — ABNORMAL LOW (ref 4.14–5.80)
RDW: 13.6 % (ref 12.3–15.4)
WBC: 11.7 10*3/uL — ABNORMAL HIGH (ref 3.4–10.8)

## 2016-06-22 NOTE — Telephone Encounter (Signed)
Pt called to see if his lab results were back yet. Please call [phone]

## 2016-06-22 NOTE — Telephone Encounter (Signed)
Spoke with the pt. See lab results.

## 2016-06-23 ENCOUNTER — Encounter (HOSPITAL_COMMUNITY): Payer: Self-pay

## 2016-06-23 ENCOUNTER — Encounter: Payer: Self-pay | Admitting: Internal Medicine

## 2016-06-23 ENCOUNTER — Observation Stay (HOSPITAL_COMMUNITY)
Admission: EM | Admit: 2016-06-23 | Discharge: 2016-06-25 | Disposition: A | Discharge status: Home / Self Care | Payer: Medicare HMO | Attending: Family Medicine | Admitting: Family Medicine

## 2016-06-23 DIAGNOSIS — K219 Gastro-esophageal reflux disease without esophagitis: Secondary | ICD-10-CM | POA: Insufficient documentation

## 2016-06-23 DIAGNOSIS — I130 Hypertensive heart and chronic kidney disease with heart failure and stage 1 through stage 4 chronic kidney disease, or unspecified chronic kidney disease: Secondary | ICD-10-CM | POA: Diagnosis not present

## 2016-06-23 DIAGNOSIS — E1122 Type 2 diabetes mellitus with diabetic chronic kidney disease: Secondary | ICD-10-CM | POA: Diagnosis not present

## 2016-06-23 DIAGNOSIS — I480 Paroxysmal atrial fibrillation: Secondary | ICD-10-CM

## 2016-06-23 DIAGNOSIS — Z880 Allergy status to penicillin: Secondary | ICD-10-CM | POA: Diagnosis not present

## 2016-06-23 DIAGNOSIS — Z794 Long term (current) use of insulin: Secondary | ICD-10-CM | POA: Diagnosis not present

## 2016-06-23 DIAGNOSIS — I252 Old myocardial infarction: Secondary | ICD-10-CM | POA: Insufficient documentation

## 2016-06-23 DIAGNOSIS — N183 Chronic kidney disease, stage 3 (moderate): Secondary | ICD-10-CM | POA: Insufficient documentation

## 2016-06-23 DIAGNOSIS — Z951 Presence of aortocoronary bypass graft: Secondary | ICD-10-CM | POA: Diagnosis not present

## 2016-06-23 DIAGNOSIS — K5791 Diverticulosis of intestine, part unspecified, without perforation or abscess with bleeding: Secondary | ICD-10-CM | POA: Diagnosis not present

## 2016-06-23 DIAGNOSIS — K8080 Other cholelithiasis without obstruction: Secondary | ICD-10-CM | POA: Diagnosis not present

## 2016-06-23 DIAGNOSIS — K5731 Diverticulosis of large intestine without perforation or abscess with bleeding: Secondary | ICD-10-CM | POA: Diagnosis not present

## 2016-06-23 DIAGNOSIS — Z95810 Presence of automatic (implantable) cardiac defibrillator: Secondary | ICD-10-CM | POA: Diagnosis present

## 2016-06-23 DIAGNOSIS — E785 Hyperlipidemia, unspecified: Secondary | ICD-10-CM | POA: Diagnosis not present

## 2016-06-23 DIAGNOSIS — I5022 Chronic systolic (congestive) heart failure: Secondary | ICD-10-CM | POA: Diagnosis not present

## 2016-06-23 DIAGNOSIS — K449 Diaphragmatic hernia without obstruction or gangrene: Secondary | ICD-10-CM | POA: Insufficient documentation

## 2016-06-23 DIAGNOSIS — I4891 Unspecified atrial fibrillation: Secondary | ICD-10-CM | POA: Diagnosis not present

## 2016-06-23 DIAGNOSIS — N289 Disorder of kidney and ureter, unspecified: Secondary | ICD-10-CM | POA: Diagnosis not present

## 2016-06-23 DIAGNOSIS — Z888 Allergy status to other drugs, medicaments and biological substances status: Secondary | ICD-10-CM | POA: Diagnosis not present

## 2016-06-23 DIAGNOSIS — I255 Ischemic cardiomyopathy: Secondary | ICD-10-CM | POA: Insufficient documentation

## 2016-06-23 DIAGNOSIS — K922 Gastrointestinal hemorrhage, unspecified: Secondary | ICD-10-CM | POA: Diagnosis not present

## 2016-06-23 DIAGNOSIS — K921 Melena: Secondary | ICD-10-CM | POA: Diagnosis present

## 2016-06-23 DIAGNOSIS — M1990 Unspecified osteoarthritis, unspecified site: Secondary | ICD-10-CM | POA: Insufficient documentation

## 2016-06-23 DIAGNOSIS — D6832 Hemorrhagic disorder due to extrinsic circulating anticoagulants: Secondary | ICD-10-CM | POA: Insufficient documentation

## 2016-06-23 DIAGNOSIS — D62 Acute posthemorrhagic anemia: Secondary | ICD-10-CM | POA: Diagnosis present

## 2016-06-23 DIAGNOSIS — E119 Type 2 diabetes mellitus without complications: Secondary | ICD-10-CM

## 2016-06-23 DIAGNOSIS — I2510 Atherosclerotic heart disease of native coronary artery without angina pectoris: Secondary | ICD-10-CM | POA: Diagnosis not present

## 2016-06-23 DIAGNOSIS — Z79899 Other long term (current) drug therapy: Secondary | ICD-10-CM | POA: Insufficient documentation

## 2016-06-23 DIAGNOSIS — Z7901 Long term (current) use of anticoagulants: Secondary | ICD-10-CM | POA: Diagnosis not present

## 2016-06-23 DIAGNOSIS — R109 Unspecified abdominal pain: Secondary | ICD-10-CM

## 2016-06-23 LAB — CBC
HCT: 28.3 % — ABNORMAL LOW (ref 39.0–52.0)
HEMATOCRIT: 25.6 % — AB (ref 39.0–52.0)
HEMOGLOBIN: 9.4 g/dL — AB (ref 13.0–17.0)
Hemoglobin: 8.8 g/dL — ABNORMAL LOW (ref 13.0–17.0)
MCH: 31.4 pg (ref 26.0–34.0)
MCH: 32 pg (ref 26.0–34.0)
MCHC: 33.2 g/dL (ref 30.0–36.0)
MCHC: 34.4 g/dL (ref 30.0–36.0)
MCV: 94.6 fL (ref 78.0–100.0)
MCV: 93.1 fL (ref 78.0–100.0)
Platelets: 224 10*3/uL (ref 150–400)
Platelets: 196 10*3/uL (ref 150–400)
RBC: 2.99 MIL/uL — AB (ref 4.22–5.81)
RBC: 2.75 MIL/uL — ABNORMAL LOW (ref 4.22–5.81)
RDW: 13.4 % (ref 11.5–15.5)
RDW: 14 % (ref 11.5–15.5)
WBC: 8.7 10*3/uL (ref 4.0–10.5)
WBC: 8.7 10*3/uL (ref 4.0–10.5)

## 2016-06-23 LAB — COMPREHENSIVE METABOLIC PANEL
ALK PHOS: 78 U/L (ref 38–126)
ALT: 43 U/L (ref 17–63)
ANION GAP: 4 — AB (ref 5–15)
AST: 19 U/L (ref 15–41)
Albumin: 3.1 g/dL — ABNORMAL LOW (ref 3.5–5.0)
BILIRUBIN TOTAL: 0.8 mg/dL (ref 0.3–1.2)
BUN: 40 mg/dL — ABNORMAL HIGH (ref 6–20)
CALCIUM: 8.6 mg/dL — AB (ref 8.9–10.3)
CO2: 28 mmol/L (ref 22–32)
Chloride: 103 mmol/L (ref 101–111)
Creatinine, Ser: 1.79 mg/dL — ABNORMAL HIGH (ref 0.61–1.24)
GFR, EST AFRICAN AMERICAN: 40 mL/min — AB (ref >60)
GFR, EST NON AFRICAN AMERICAN: 34 mL/min — AB (ref >60)
GLUCOSE: 184 mg/dL — AB (ref 65–99)
Potassium: 4.4 mmol/L (ref 3.5–5.1)
Sodium: 135 mmol/L (ref 135–145)
TOTAL PROTEIN: 5.6 g/dL — AB (ref 6.5–8.1)

## 2016-06-23 LAB — TYPE AND SCREEN
ABO/RH(D): A POS
ANTIBODY SCREEN: NEGATIVE

## 2016-06-23 LAB — POC OCCULT BLOOD, ED: Fecal Occult Bld: POSITIVE — AB

## 2016-06-23 MED ORDER — INSULIN ASPART 100 UNIT/ML ~~LOC~~ SOLN
0.0000 [IU] | Freq: Every day | SUBCUTANEOUS | Status: DC
  Administered 2016-06-24: 2 [IU] via SUBCUTANEOUS

## 2016-06-23 MED ORDER — CARVEDILOL 12.5 MG PO TABS
12.5000 mg | ORAL_TABLET | Freq: Two times a day (BID) | ORAL | Status: DC
  Administered 2016-06-23 – 2016-06-25 (×3): 12.5 mg via ORAL
  Filled 2016-06-23 (×3): qty 1

## 2016-06-23 MED ORDER — BOOST / RESOURCE BREEZE PO LIQD
1.0000 | Freq: Three times a day (TID) | ORAL | Status: DC
  Administered 2016-06-24: 1 via ORAL

## 2016-06-23 MED ORDER — SODIUM CHLORIDE 0.9% FLUSH: 3.0000 mL | INTRAVENOUS | Status: DC | PRN

## 2016-06-23 MED ORDER — ACETAMINOPHEN 650 MG RE SUPP: 650.0000 mg | Freq: Four times a day (QID) | RECTAL | Status: DC | PRN

## 2016-06-23 MED ORDER — ISOSORBIDE MONONITRATE ER 60 MG PO TB24
30.0000 mg | ORAL_TABLET | Freq: Every day | ORAL | Status: DC
  Administered 2016-06-23 – 2016-06-25 (×3): 30 mg via ORAL
  Filled 2016-06-23 (×3): qty 1

## 2016-06-23 MED ORDER — SODIUM CHLORIDE 0.9 % IV SOLN: 250.0000 mL | INTRAVENOUS | Status: DC | PRN

## 2016-06-23 MED ORDER — SODIUM CHLORIDE 0.9% FLUSH
3.0000 mL | Freq: Two times a day (BID) | INTRAVENOUS | Status: DC
  Administered 2016-06-23 – 2016-06-24 (×3): 3 mL via INTRAVENOUS

## 2016-06-23 MED ORDER — INSULIN GLARGINE 100 UNIT/ML ~~LOC~~ SOLN
12.0000 [IU] | Freq: Every day | SUBCUTANEOUS | Status: DC
  Administered 2016-06-24 – 2016-06-25 (×2): 12 [IU] via SUBCUTANEOUS
  Filled 2016-06-23 (×4): qty 0.12

## 2016-06-23 MED ORDER — SODIUM CHLORIDE 0.9 % IV BOLUS (SEPSIS)
1000.0000 mL | Freq: Once | INTRAVENOUS | Status: AC
  Administered 2016-06-23: 1000 mL via INTRAVENOUS

## 2016-06-23 MED ORDER — ACETAMINOPHEN 325 MG PO TABS
650.0000 mg | ORAL_TABLET | Freq: Four times a day (QID) | ORAL | Status: DC | PRN
  Administered 2016-06-23 – 2016-06-25 (×3): 650 mg via ORAL
  Filled 2016-06-23 (×3): qty 2

## 2016-06-23 NOTE — Consult Note (Signed)
Referring Provider: No ref. provider found Primary Care Physician:  Samuel JesterYNTHIA [name], DO Primary Gastroenterologist:  Dr. Jena Gaussourk  Date of Admission: 06/23/16 Date of Consultation: 06/23/16  Reason for Consultation:  GI bleed, symptomatic anemia  HPI:  Wayne Gordon is a 82 y.o. male with a past medical history of AFib on anticoagulation, CHF, CAD, GERD, ICD placement, mitral regurg, and renal insufficiency. He called our office 2 days ago complaining of brbpr with no constipation associated with brief mild lower abdominal pain and feels like his BP was low. We ordered a CBC, recommended clear liquid diet, and present to the ER if recurrent rectal bleeding. His hgb was noted 12.4 and mildly elevated WBC and offered urgent office visit slot. When he was notified he stated he saw dark blood again the morning of 10/25.  He presented to the ER today complaining of bloody stools for 4 days and stopped yesterday. Has had associated fatigue and lightheadedness. Denies abdominal pain, N/V. History of diverticulosis 2 years ago. Is on chronic Eliquis stopped 2 days ago.  Labs in the ED showed further decline in hgb to 9.4 (from 12.4 2 days ago; baseline 14). Leucocytosis improved. CMP with Cr 1.79 (at baseline), liver function normal. Had melanotic stool in the ER, heme+.  Today he states he began having brbpr 4 days ago. In the past 1-2 days his stools have been black "and almost like diarrhea." Denies chest pain, admits dyspnea on exertion, fatigue with minimal exertion, and lightheadedness when standing. His bleeding has been associated with brief abdominal pain, but not abdominal pain at this time. Has also had some epigastric pain radiating across his upper abdomen. Has been trying a natural supplement (papaya?) for GERD. Takes ibuprofen about two times a week, denies ASA powders. Had about 4-5 black bowel movements yesterday, last BM was yesterday.  Last TCS 2015 with pancolonic diverticulosis.  Past  Medical History:  Diagnosis Date  . Allergy to ACE inhibitors    Angioedema many years ago; patient has tolerated Losartan (ARB) in the past - [facility] renal function >> resume Losartan 25 mg QD 06/2015  . Arthritis   . Atrial fibrillation (HCC)    PAF, CHADs2Vasc = 5  . Carotid artery disease (HCC)    60-79% LICA  . CHF (congestive heart failure) (HCC)   . Coronary artery disease   . Diabetes mellitus   . GERD (gastroesophageal reflux disease)   . Hyperlipidemia   . Hypertension   . ICD (implantable cardiac defibrillator) in place 03/28/2012   [place], Dr. Ladona Ridgelaylor 03/28/12  . Ischemic cardiomyopathy    S/P CABG x 3; EF 25%  . Mitral regurgitation    S/P mitral valve repair 2013  . Myocardial infarction   . Renal insufficiency     Past Surgical History:  Procedure Laterality Date  . COLONOSCOPY N/A 04/24/2014   RMR Pancolonic diverticulosis  . CORONARY ARTERY BYPASS GRAFT  10/17/2011   Procedure: CORONARY ARTERY BYPASS GRAFTING (CABG);  Surgeon: Purcell Nailslarence H Owen, MD;  [facility];  Service: Open Heart Surgery;  Laterality: N/A;  . ESOPHAGOGASTRODUODENOSCOPY N/A 04/24/2014   RMR Subtle nodularity the gastric mucosa of uncertain significance-status post biopsy. Hiatal hernia. chronic inflammation, no H.pylori  . ICD  03/28/2012  . IMPLANTABLE CARDIOVERTER DEFIBRILLATOR IMPLANT N/A 03/28/2012   Procedure: IMPLANTABLE CARDIOVERTER DEFIBRILLATOR IMPLANT;  Surgeon: Marinus MawGregg W Taylor, MD;  [facility];  Service: Cardiovascular;  Laterality: N/A;  . KNEE  ARTHROSCOPY  ~ 2008   right  . LEFT AND RIGHT HEART CATHETERIZATION WITH CORONARY ANGIOGRAM N/A 10/12/2011   Procedure: LEFT AND RIGHT HEART CATHETERIZATION WITH CORONARY ANGIOGRAM;  Surgeon: Iran Ouch, MD;  Location: [facility] CATH LAB;  Service: Cardiovascular;  Laterality: N/A;  . MITRAL VALVE REPAIR  10/17/2011   Procedure: MITRAL VALVE REPAIR (MVR);  Surgeon: Purcell Nails, MD;   Location: [facility];  Service: Open Heart Surgery;  Laterality: N/A;  . NASAL SINUS SURGERY  1990's   right    Prior to Admission medications   Medication Sig Start Date End Date Taking? Authorizing Provider  CALCIUM-MAGNESIUM-ZINC PO Take 2 tablets by mouth daily.   Yes Historical Provider, MD  carvedilol (COREG) 25 MG tablet Take 0.5 tablets (12.5 mg total) by mouth 2 (two) times daily with a meal. 07/11/15  Yes Scott T Alben Spittle, PA-C  furosemide (LASIX) 20 MG tablet Take 20 mg tablet by month (1 tablet) alternating with 40 mg (2 tablets) every other day. 05/23/16  Yes Leone Brand, NP  glipiZIDE (GLUCOTROL XL) 10 MG 24 hr tablet Take 10 mg by mouth 2 times daily at 12 noon and 4 pm. Pt takes it in the AM and PM   Yes Historical Provider, MD  Insulin Glargine (TOUJEO SOLOSTAR) 300 UNIT/ML SOPN Inject 12 Units as directed daily.  04/27/15  Yes Historical Provider, MD  isosorbide mononitrate (IMDUR) 30 MG 24 hr tablet Take 1 tablet (30 mg total) by mouth daily. 07/11/15  Yes Scott T Alben Spittle, PA-C  losartan (COZAAR) 25 MG tablet Take 1 tablet (25 mg total) by mouth daily. 07/11/15  Yes Beatrice Lecher, PA-C  Multiple Vitamins-Minerals (MULTIVITAMINS THER. W/MINERALS) TABS Take 1 tablet by mouth daily.   Yes Historical Provider, MD  omeprazole (PRILOSEC) 20 MG capsule Take 20 mg by mouth daily.    Yes Historical Provider, MD  rosuvastatin (CRESTOR) 20 MG tablet Take 20 mg by mouth every evening.   Yes Historical Provider, MD  apixaban (ELIQUIS) 5 MG TABS tablet Take 1 tablet (5 mg total) by mouth 2 (two) times daily. 03/29/16   Marinus Maw, MD  ELIQUIS 5 MG TABS tablet Take 1 tablet (5 mg total) by mouth 2 (two) times daily. 06/16/16   Marinus Maw, MD  testosterone cypionate (DEPO-TESTOSTERONE) 200 MG/ML injection Inject 0.5 mg into the muscle every 21 ( twenty-one) days. INJECT 0.6ML- 0.8ML EVERY 2 WEEKS 08/13/14   Historical Provider, MD    No current facility-administered medications for this  encounter.    Current Outpatient Prescriptions  Medication Sig Dispense Refill  . CALCIUM-MAGNESIUM-ZINC PO Take 2 tablets by mouth daily.    . carvedilol (COREG) 25 MG tablet Take 0.5 tablets (12.5 mg total) by mouth 2 (two) times daily with a meal. 135 tablet 6  . furosemide (LASIX) 20 MG tablet Take 20 mg tablet by month (1 tablet) alternating with 40 mg (2 tablets) every other day. 90 tablet 3  . glipiZIDE (GLUCOTROL XL) 10 MG 24 hr tablet Take 10 mg by mouth 2 times daily at 12 noon and 4 pm. Pt takes it in the AM and PM    . Insulin Glargine (TOUJEO SOLOSTAR) 300 UNIT/ML SOPN Inject 12 Units as directed daily.     . isosorbide mononitrate (IMDUR) 30 MG 24 hr tablet Take 1 tablet (30 mg total) by mouth daily. 30 tablet 11  . losartan (COZAAR) 25 MG tablet Take 1 tablet (25 mg total) by mouth daily.  30 tablet 11  . Multiple Vitamins-Minerals (MULTIVITAMINS THER. W/MINERALS) TABS Take 1 tablet by mouth daily.    [name] Kitchen omeprazole (PRILOSEC) 20 MG capsule Take 20 mg by mouth daily.     . rosuvastatin (CRESTOR) 20 MG tablet Take 20 mg by mouth every evening.    [name] Kitchen apixaban (ELIQUIS) 5 MG TABS tablet Take 1 tablet (5 mg total) by mouth 2 (two) times daily. 60 tablet 6  . ELIQUIS 5 MG TABS tablet Take 1 tablet (5 mg total) by mouth 2 (two) times daily. 60 tablet 3  . testosterone cypionate (DEPO-TESTOSTERONE) 200 MG/ML injection Inject 0.5 mg into the muscle every 21 ( twenty-one) days. INJECT 0.6ML- 0.8ML EVERY 2 WEEKS      Allergies as of 06/23/2016 - Review Complete 06/23/2016  Allergen Reaction Noted  . Ace inhibitors Swelling and Other (See Comments) 02/03/2014  . Penicillins Palpitations 10/10/2011    Family History  Problem Relation Age of Onset  . Heart disease Mother   . Diabetes Father   . Cardiomyopathy Father     Social History   Social History  . Marital status: Married    Spouse name: N/A  . Number of children: N/A  . Years of education: N/A   Occupational History  .  Retired    Social History Main Topics  . Smoking status: Former Games developer  . Smokeless tobacco: Never Used     Comment: Never smoked  . Alcohol use No  . Drug use: No  . Sexual activity: Yes   Other Topics Concern  . Not on file   Social History Narrative  . No narrative on file    Review of Systems: General: Negative for anorexia, weight loss, fever, chills. ENT: Negative for hoarseness, difficulty swallowing. CV: Negative for chest pain, angina, palpitations, peripheral edema.  Respiratory: Negative for dyspnea at rest, cough, sputum, wheezing.  GI: See history of present illness. Derm: Negative for rash or itching.  Endo: Negative for unusual weight change.  Heme: Negative for bruising or bleeding. Allergy: Negative for rash or hives.  Physical Exam: Vital signs in last 24 hours: Temp:  [97.6 F (36.4 C)] 97.6 F (36.4 C) (10/26 1143) Pulse Rate:  [64-83] 67 (10/26 1430) Resp:  [13-26] 17 (10/26 1430) BP: (107-127)/(52-68) 112/53 (10/26 1430) SpO2:  [98 %-100 %] 98 % (10/26 1430) Weight:  [233 lb (105.7 kg)] 233 lb (105.7 kg) (10/26 1144)   General:   Alert,  Well-developed, well-nourished, pleasant and cooperative in NAD Head:  Normocephalic and atraumatic. Eyes:  Sclera clear, no icterus. Conjunctiva pink. Ears:  Normal auditory acuity. Lungs:  Clear throughout to auscultation.   No wheezes, crackles, or rhonchi. No acute distress. Heart:  Regular rate and rhythm; no murmurs, clicks, rubs,  or gallops. Abdomen:  Soft, nontender and nondistended. No masses, hepatosplenomegaly or hernias noted. Normal bowel sounds, without guarding, and without rebound.   Rectal:  Deferred.   Msk:  Symmetrical without gross deformities. Extremities:  Without clubbing or edema. Neurologic:  Alert and  oriented x4;  grossly normal neurologically. Skin:  Intact without significant lesions or rashes. Psych:  Alert and cooperative. Normal mood and affect.  Intake/Output from previous  day: No intake/output data recorded. Intake/Output this shift: No intake/output data recorded.  Lab Results:  Recent Labs  06/21/16 1413 06/23/16 1240  WBC 11.7* 8.7  HGB  --  9.4*  HCT 36.8* 28.3*  PLT 231 224   BMET  Recent Labs  06/23/16 1240  NA 135  K 4.4  CL 103  CO2 28  GLUCOSE 184*  BUN 40*  CREATININE 1.79*  CALCIUM 8.6*   LFT  Recent Labs  06/23/16 1240  PROT 5.6*  ALBUMIN 3.1*  AST 19  ALT 43  ALKPHOS 78  BILITOT 0.8   PT/INR No results for input(s): LABPROT, INR in the last 72 hours. Hepatitis Panel No results for input(s): HEPBSAG, HCVAB, HEPAIGM, HEPBIGM in the last 72 hours. C-Diff No results for input(s): CDIFFTOX in the last 72 hours.  Studies/Results: No results found.  Impression: 82 year old male with a history of pancolonic diverticulosis and AFib s/p ICD placement and anticoagulation. Has been using NSAIDs about twice a week in addition to Eliquis. Has been having what sounds to be symptomatic GERD recently, taking natural remedy. Has had 4 days of bleeding, at first noted to be red but more recently consistent with melena. Heme+ in the ER. Hgb baseline at 14, CBC a couple days ago down to 12.4 and in the ER down to 9.4. Patient symptomatic including dyspnea/fatigue on exertion, dizziness when standing. Vitals are stable.  Most likely upper GI bleed in the setting of NSAIDs with Eliquis. Possible element of lower GI bleed, potentially diverticular. However, diverticular bleed tends to be self-limiting and resolves in 24 hours and he has not seen any red blood per rectum in the past 2-3 days. Last dose of Eliquis was Tuesday morning, about 60 hours ago.  Plan: 1. Monitor for recurrent GI bleed 2. Monitor H/H closely for further hgb decline 3. Transfuse as necessary 4. Plan for add on EGD tomorrow; discussed with Dr. Jena Gauss 5. Supportive measures   Thank you for allowing [place] to participate in the care of Wayne Gordon  Wynne Dust,  DNP, AGNP-C Adult & Gerontological Nurse Practitioner [facility]    LOS: 0 days     06/23/2016, 3:28 PM

## 2016-06-23 NOTE — ED Triage Notes (Signed)
Pt states he has diverticulosis and has had blood stools since Sunday. States the blood was bright initially but got darker over the days. Denies any blood stools today but states he has no energy

## 2016-06-23 NOTE — Progress Notes (Signed)
Spoke with Joni Reining, NP with [facility] as per patient request. She states he is ok to undergo EGD. She recommends if any electrical based biopsy (hot biopsy) or cauterization is performed to notify cardiology so they can arrange for post-procedure device interrogation.  Wynne Dust, DNP, AGNP-C Adult & Gerontological Nurse Practitioner [facility]

## 2016-06-23 NOTE — ED Provider Notes (Signed)
[facility]-EMERGENCY DEPT Provider Note   CSN: [id number] Arrival date & time: 06/23/16  1130  By signing my name below, I, Placido SouLogan Joldersma, attest that this documentation has been prepared under the direction and in the presence of Doug SouSam Maleeyah Mccaughey, MD. Electronically Signed: Placido SouLogan Joldersma, ED Scribe. 06/23/16. 12:44 PM.   History   Chief Complaint Chief Complaint  Patient presents with  . Fatigue    HPI HPI Comments: Wayne Gordon is a 82 y.o. male who presents to the Emergency Department complaining of bloody stools x 4 days. He states they began four days ago and stopped yesterday. Since, pt states he has experienced fatigue which worsens with any exertion, also complains of lightheadedness worse with standing, improved with lying supine. He feels well when he lies supine. He denies vomiting or nausea. Denies abdominal pain. He reports a h/o diverticulosis ~2 years ago. He confirms his listed medical history. Pt was on Eliquis and was taken off of it two days ago due to his rectal bleeding. He has a GI doctor and was last evaluated by him two years ago for a colonoscopy which he states was normal. He is not a smoker and does not consume ETOH. No other associated symptoms at this time. No treatment prior to coming here. He woke with his physicians.Eliquis was stopped 2 days ago, and he's been on clear liquid diet for the past 2 days   The history is provided by the patient. No language interpreter was used.    Past Medical History:  Diagnosis Date  . Allergy to ACE inhibitors    Angioedema many years ago; patient has tolerated Losartan (ARB) in the past - [facility] renal function >> resume Losartan 25 mg QD 06/2015  . Arthritis   . Atrial fibrillation (HCC)    PAF, CHADs2Vasc = 5  . Carotid artery disease (HCC)    60-79% LICA  . CHF (congestive heart failure) (HCC)   . Coronary artery disease   . Diabetes mellitus   . GERD (gastroesophageal  reflux disease)   . Hyperlipidemia   . Hypertension   . ICD (implantable cardiac defibrillator) in place 03/28/2012   Biotronik, Dr. Ladona Ridgelaylor 03/28/12  . Ischemic cardiomyopathy    S/P CABG x 3; EF 25%  . Mitral regurgitation    S/P mitral valve repair 2013  . Myocardial infarction   . Renal insufficiency     Patient Active Problem List   Diagnosis Date Noted  . Carotid artery disease (HCC)   . Syncope and collapse 07/11/2015  . Ventricular arrhythmia 07/10/2015  . Complete heart block (HCC)   . ICD (implantable cardioverter-defibrillator) discharge   . Atrial fibrillation (HCC) 06/04/2015  . Diabetes (HCC) 06/02/2015  . 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/02/2015  . GERD (gastroesophageal reflux disease) 02/18/2015  . Abdominal pain, epigastric 02/18/2015  . Chest pain 09/01/2014  . Pleural effusion 09/01/2014  . Anemia 07/08/2014  . Rectal bleeding 03/25/2014  . Acute blood loss anemia 03/25/2014  . ARF (acute renal failure) (HCC) 02/03/2014  . Body mass index (BMI) of 28.0-28.9 in adult 09/30/2013  . Diabetes mellitus, type 2 (HCC) 08/09/2013  . PVC's (premature ventricular contractions) 07/04/2013  . HLD (hyperlipidemia) 01/03/2013  . Automatic implantable cardioverter-defibrillator in situ 03/30/2012  . S/P MVR (mitral valve repair) 01/16/2012  . HTN (hypertension) 11/28/2011  . Chronic systolic heart failure (HCC) 10/31/2011  . Ischemic cardiomyopathy 10/31/2011  . CKD (chronic kidney disease) stage 3, GFR 30-59  ml/min 10/31/2011  . Current use of long term anticoagulation 10/31/2011  . Long term (current) use of anticoagulants 10/28/2011  . S/P mitral valve repair 10/17/2011  . S/P CABG x 3 10/17/2011  . Acute systolic heart failure (HCC) 10/12/2011  . Mitral regurgitation 10/12/2011  . NSTEMI (non-ST elevated myocardial infarction) (HCC) 10/11/2011    Past Surgical History:  Procedure Laterality Date  . COLONOSCOPY N/A 04/24/2014   RMR Pancolonic diverticulosis    . CORONARY ARTERY BYPASS GRAFT  10/17/2011   Procedure: CORONARY ARTERY BYPASS GRAFTING (CABG);  Surgeon: Purcell Nails, MD;  Location: [facility] OR;  Service: Open Heart Surgery;  Laterality: N/A;  . ESOPHAGOGASTRODUODENOSCOPY N/A 04/24/2014   RMR Subtle nodularity the gastric mucosa of uncertain significance-status post biopsy. Hiatal hernia. chronic inflammation, no H.pylori  . ICD  03/28/2012  . IMPLANTABLE CARDIOVERTER DEFIBRILLATOR IMPLANT N/A 03/28/2012   Procedure: IMPLANTABLE CARDIOVERTER DEFIBRILLATOR IMPLANT;  Surgeon: Marinus Maw, MD;  Location: [facility] CATH LAB;  Service: Cardiovascular;  Laterality: N/A;  . KNEE ARTHROSCOPY  ~ 2008   right  . LEFT AND RIGHT HEART CATHETERIZATION WITH CORONARY ANGIOGRAM N/A 10/12/2011   Procedure: LEFT AND RIGHT HEART CATHETERIZATION WITH CORONARY ANGIOGRAM;  Surgeon: Iran Ouch, MD;  Location: [facility] CATH LAB;  Service: Cardiovascular;  Laterality: N/A;  . MITRAL VALVE REPAIR  10/17/2011   Procedure: MITRAL VALVE REPAIR (MVR);  Surgeon: Purcell Nails, MD;  Location: [facility] OR;  Service: Open Heart Surgery;  Laterality: N/A;  . NASAL SINUS SURGERY  1990's   right       Home Medications    Prior to Admission medications   Medication Sig Start Date End Date Taking? Authorizing Provider  apixaban (ELIQUIS) 5 MG TABS tablet Take 1 tablet (5 mg total) by mouth 2 (two) times daily. 03/29/16   Marinus Maw, MD  CALCIUM-MAGNESIUM-ZINC PO Take 2 tablets by mouth daily.    Historical Provider, MD  carvedilol (COREG) 25 MG tablet Take 0.5 tablets (12.5 mg total) by mouth 2 (two) times daily with a meal. 07/11/15   Scott T Weaver, PA-C  ELIQUIS 5 MG TABS tablet Take 1 tablet (5 mg total) by mouth 2 (two) times daily. 06/16/16   Marinus Maw, MD  furosemide (LASIX) 20 MG tablet Take 20 mg tablet by month (1 tablet) alternating with 40 mg (2 tablets) every other day. 05/23/16   Leone Brand, NP  glipiZIDE (GLUCOTROL XL) 10 MG 24 hr tablet Take 10 mg by mouth 2  times daily at 12 noon and 4 pm. Pt takes it in the AM and PM    Historical Provider, MD  hydrALAZINE (APRESOLINE) 25 MG tablet Take 25 mg by mouth as needed.  09/22/14   Historical Provider, MD  HYDROcodone-acetaminophen (NORCO) 10-325 MG tablet Take 1 tablet by mouth every 6 (six) hours as needed for moderate pain. Reported on 02/05/2016 04/15/15   Historical Provider, MD  Insulin Glargine (TOUJEO SOLOSTAR) 300 UNIT/ML SOPN Inject 12 Units as directed daily.  04/27/15   Historical Provider, MD  isosorbide mononitrate (IMDUR) 30 MG 24 hr tablet Take 1 tablet (30 mg total) by mouth daily. 07/11/15   Beatrice Lecher, PA-C  losartan (COZAAR) 25 MG tablet Take 1 tablet (25 mg total) by mouth daily. 07/11/15   Beatrice Lecher, PA-C  Multiple Vitamins-Minerals (MULTIVITAMINS THER. W/MINERALS) TABS Take 1 tablet by mouth daily.    Historical Provider, MD  Omega-3 Fatty Acids (FISH OIL) 1000 MG CAPS Take 1  capsule by mouth 2 (two) times daily.    Historical Provider, MD  omeprazole (PRILOSEC) 20 MG capsule Take 20 mg by mouth daily.     Historical Provider, MD  rosuvastatin (CRESTOR) 20 MG tablet Take 20 mg by mouth every evening.    Historical Provider, MD  testosterone cypionate (DEPO-TESTOSTERONE) 200 MG/ML injection Inject 0.5 mg into the muscle every 21 ( twenty-one) days. INJECT 0.6ML- 0.8ML EVERY 2 WEEKS 08/13/14   Historical Provider, MD    Family History Family History  Problem Relation Age of Onset  . Heart disease Mother   . Diabetes Father   . Cardiomyopathy Father     Social History Social History  Substance Use Topics  . Smoking status: Never Smoker  . Smokeless tobacco: Never Used     Comment: Never smoked  . Alcohol use No     Allergies   Ace inhibitors and Penicillins   Review of Systems Review of Systems  Constitutional: Positive for fatigue.  HENT: Negative.   Respiratory: Negative.   Cardiovascular: Positive for chest pain.       Syncope  Gastrointestinal: Positive for  anal bleeding and blood in stool.  Musculoskeletal: Negative.   Skin: Negative.   Allergic/Immunologic: Negative.   Neurological: Positive for light-headedness.  Psychiatric/Behavioral: Negative.   All other systems reviewed and are negative.  Physical Exam Updated Vital Signs BP 127/66   Pulse 70   Temp 97.6 F (36.4 C) (Oral)   Resp 17   Ht 6\' 5"  (1.956 m)   Wt 233 lb (105.7 kg)   SpO2 100%   BMI 27.63 kg/m   Physical Exam  Constitutional: He appears well-developed and well-nourished. No distress.  HENT:  Head: Normocephalic and atraumatic.  Eyes: Conjunctivae are normal. Pupils are equal, round, and reactive to light.  Neck: Neck supple. No tracheal deviation present. No thyromegaly present.  Cardiovascular: Normal rate and regular rhythm.   No murmur heard. Pulmonary/Chest: Effort normal and breath sounds normal.  Abdominal: Soft. Bowel sounds are normal. He exhibits no distension. There is no tenderness.  Genitourinary: Rectal exam shows guaiac positive stool.  Genitourinary Comments: Melanotic stool  Musculoskeletal: Normal range of motion. He exhibits no edema or tenderness.  Neurological: He is alert. Coordination normal.  Skin: Skin is warm and dry. No rash noted.  Psychiatric: He has a normal mood and affect.  Nursing note and vitals reviewed.   ED Treatments / Results  Labs (all labs ordered are listed, but only abnormal results are displayed) Labs Reviewed  COMPREHENSIVE METABOLIC PANEL  CBC  POC OCCULT BLOOD, ED  TYPE AND SCREEN   EKG  EKG Interpretation  Date/Time:  Thursday June 23 2016 12:52:49 EDT Ventricular Rate:  74 PR Interval:    QRS Duration: 210 QT Interval:  468 QTC Calculation: 520 R Axis:   -80 Text Interpretation:  Atrial-sensed ventricular-paced complexes No further analysis attempted due to paced rhythm Confirmed by Ethelda Chick  MD, Janeil Schexnayder ([id number]) on 06/23/2016 1:00:01 PM       Radiology No results  found.  Procedures Procedures  DIAGNOSTIC STUDIES: Oxygen Saturation is 100% on RA, normal by my interpretation.    COORDINATION OF CARE: 12:44 PM Discussed next steps with pt. Pt verbalized understanding and is agreeable with the plan.    Medications Ordered in ED Medications - No data to display   Initial Impression / Assessment and Plan / ED Course  I have reviewed the triage vital signs and the nursing notes.  Pertinent labs &  imaging results that were available during my care of the patient were reviewed by me and considered in my medical decision making (see chart for details). Results for orders placed or performed during the hospital encounter of 06/23/16  Comprehensive metabolic panel  Result Value Ref Range   Sodium 135 135 - 145 mmol/L   Potassium 4.4 3.5 - 5.1 mmol/L   Chloride 103 101 - 111 mmol/L   CO2 28 22 - 32 mmol/L   Glucose, Bld 184 (H) 65 - 99 mg/dL   BUN 40 (H) 6 - 20 mg/dL   Creatinine, Ser 1.63 (H) 0.61 - 1.24 mg/dL   Calcium 8.6 (L) 8.9 - 10.3 mg/dL   Total Protein 5.6 (L) 6.5 - 8.1 g/dL   Albumin 3.1 (L) 3.5 - 5.0 g/dL   AST 19 15 - 41 U/L   ALT 43 17 - 63 U/L   Alkaline Phosphatase 78 38 - 126 U/L   Total Bilirubin 0.8 0.3 - 1.2 mg/dL   GFR calc non Af Amer 34 (L) >60 mL/min   GFR calc Af Amer 40 (L) >60 mL/min   Anion gap 4 (L) 5 - 15  CBC  Result Value Ref Range   WBC 8.7 4.0 - 10.5 K/uL   RBC 2.99 (L) 4.22 - 5.81 MIL/uL   Hemoglobin 9.4 (L) 13.0 - 17.0 g/dL   HCT 84.6 (L) 65.9 - 93.5 %   MCV 94.6 78.0 - 100.0 fL   MCH 31.4 26.0 - 34.0 pg   MCHC 33.2 30.0 - 36.0 g/dL   RDW 70.1 77.9 - 39.0 %   Platelets 224 150 - 400 K/uL  POC occult blood, ED  Result Value Ref Range   Fecal Occult Bld POSITIVE (A) NEGATIVE  Type and screen [facility]  Result Value Ref Range   ABO/RH(D) A POS    Antibody Screen NEG    Sample Expiration 06/26/2016    No results found. Clinical Course  Renal insufficiency is chronic Dr. Irene Limbo  consulted. Plan 23 hour observation MedSurg floor clear liquid diet GI consult     Final Clinical Impressions(s) / ED Diagnoses  Diagnosis #1 GI bleeding #2 anemia #3 chronic renal insufficiency Final diagnoses:  None    New Prescriptions New Prescriptions   No medications on file     Doug Sou, MD 06/23/16 1421

## 2016-06-23 NOTE — H&P (Signed)
History and Physical  Wayne Gordon ERX:[id number] DOB: [birth year] DOA: 06/23/2016  PCP: Samuel Jester, DO  Patient coming from: home  Chief Complaint: bleeding  HPI:  82 year old man PMH diverticulosis presented with bloody stools for 4 days in the context of Eliquis use. Was noted to be anemic and referred for further evaluation of lower GI bleed.  Patient began bleeding in the evening October 22, he had frequent episodes of red blood throughout the evening as well as 10/23. He was instructed to stop requests and subsequently bleeding he came less frequent with last episode being 10/20 5 in the evening. No specific exacerbating or alleviating factors. Not associated with abdominal pain. Known history of diverticulosis by previous colonoscopy. He is dizzy when he stands up, feels quite fatigued. Therefore he came to the emergency department today for further evaluation.  ED Course: Afebrile, vital signs stable  Pertinent labs: Complete metabolic panel unremarkable, creatinine at baseline chronic kidney disease stage III. Hemoglobin 9.4 (12.4 two days ago). Fecal occult blood positive. EKG: Independently reviewed. Paced rhythm.  Review of Systems:  Negative for fever, new visual changes, sore throat, rash, new muscle aches, chest pain, SOB, dysuria, n/v/abdominal pain.  Past Medical History:  Diagnosis Date  . Allergy to ACE inhibitors    Angioedema many years ago; patient has tolerated Losartan (ARB) in the past - it was [facility] during [facility] for GI bleed and worsening renal function >> resume Losartan 25 mg QD 06/2015  . Arthritis   . Atrial fibrillation (HCC)    PAF, CHADs2Vasc = 5  . Carotid artery disease (HCC)    60-79% LICA  . CHF (congestive heart failure) (HCC)   . Coronary artery disease   . Diabetes mellitus   . GERD (gastroesophageal reflux disease)   . Hyperlipidemia   . Hypertension   . ICD (implantable cardiac defibrillator) in place 03/28/2012   Biotronik, Dr.  Ladona Ridgel 03/28/12  . Ischemic cardiomyopathy    S/P CABG x 3; EF 25%  . Mitral regurgitation    S/P mitral valve repair 2013  . Myocardial infarction   . Renal insufficiency     Past Surgical History:  Procedure Laterality Date  . COLONOSCOPY N/A 04/24/2014   RMR Pancolonic diverticulosis  . CORONARY ARTERY BYPASS GRAFT  10/17/2011   Procedure: CORONARY ARTERY BYPASS GRAFTING (CABG);  Surgeon: Purcell Nails, MD;  Location: [facility] OR;  Service: Open Heart Surgery;  Laterality: N/A;  . ESOPHAGOGASTRODUODENOSCOPY N/A 04/24/2014   RMR Subtle nodularity the gastric mucosa of uncertain significance-status post biopsy. Hiatal hernia. chronic inflammation, no H.pylori  . ICD  03/28/2012  . IMPLANTABLE CARDIOVERTER DEFIBRILLATOR IMPLANT N/A 03/28/2012   Procedure: IMPLANTABLE CARDIOVERTER DEFIBRILLATOR IMPLANT;  Surgeon: Marinus Maw, MD;  Location: [facility] CATH LAB;  Service: Cardiovascular;  Laterality: N/A;  . KNEE ARTHROSCOPY  ~ 2008   right  . LEFT AND RIGHT HEART CATHETERIZATION WITH CORONARY ANGIOGRAM N/A 10/12/2011   Procedure: LEFT AND RIGHT HEART CATHETERIZATION WITH CORONARY ANGIOGRAM;  Surgeon: Iran Ouch, MD;  Location: [facility] CATH LAB;  Service: Cardiovascular;  Laterality: N/A;  . MITRAL VALVE REPAIR  10/17/2011   Procedure: MITRAL VALVE REPAIR (MVR);  Surgeon: Purcell Nails, MD;  Location: [facility] OR;  Service: Open Heart Surgery;  Laterality: N/A;  . NASAL SINUS SURGERY  1990's   right     reports that he has never smoked. He has never used smokeless tobacco. He reports that he does not drink alcohol or use drugs. Ambulatory  Allergies  Allergen Reactions  . Ace Inhibitors Swelling and Other (See Comments)    Kidney issues; angioedema per PMH in chart  . Penicillins Palpitations        Family History  Problem Relation Age of Onset  . Heart disease Mother   . Diabetes Father   . Cardiomyopathy Father      Prior to Admission medications   Medication Sig Start Date End Date  Taking? Authorizing Provider  CALCIUM-MAGNESIUM-ZINC PO Take 2 tablets by mouth daily.   Yes Historical Provider, MD  carvedilol (COREG) 25 MG tablet Take 0.5 tablets (12.5 mg total) by mouth 2 (two) times daily with a meal. 07/11/15  Yes Scott T Alben SpittleWeaver, PA-C  furosemide (LASIX) 20 MG tablet Take 20 mg tablet by month (1 tablet) alternating with 40 mg (2 tablets) every other day. 05/23/16  Yes Leone BrandLaura R Ingold, NP  glipiZIDE (GLUCOTROL XL) 10 MG 24 hr tablet Take 10 mg by mouth 2 times daily at 12 noon and 4 pm. Pt takes it in the AM and PM   Yes Historical Provider, MD  Insulin Glargine (TOUJEO SOLOSTAR) 300 UNIT/ML SOPN Inject 12 Units as directed daily.  04/27/15  Yes Historical Provider, MD  isosorbide mononitrate (IMDUR) 30 MG 24 hr tablet Take 1 tablet (30 mg total) by mouth daily. 07/11/15  Yes Scott T Alben SpittleWeaver, PA-C  losartan (COZAAR) 25 MG tablet Take 1 tablet (25 mg total) by mouth daily. 07/11/15  Yes Beatrice LecherScott T Weaver, PA-C  Multiple Vitamins-Minerals (MULTIVITAMINS THER. W/MINERALS) TABS Take 1 tablet by mouth daily.   Yes Historical Provider, MD  omeprazole (PRILOSEC) 20 MG capsule Take 20 mg by mouth daily.    Yes Historical Provider, MD  rosuvastatin (CRESTOR) 20 MG tablet Take 20 mg by mouth every evening.   Yes Historical Provider, MD  apixaban (ELIQUIS) 5 MG TABS tablet Take 1 tablet (5 mg total) by mouth 2 (two) times daily. 03/29/16   Marinus MawGregg W Taylor, MD  ELIQUIS 5 MG TABS tablet Take 1 tablet (5 mg total) by mouth 2 (two) times daily. 06/16/16   Marinus MawGregg W Taylor, MD  testosterone cypionate (DEPO-TESTOSTERONE) 200 MG/ML injection Inject 0.5 mg into the muscle every 21 ( twenty-one) days. INJECT 0.6ML- 0.8ML EVERY 2 WEEKS 08/13/14   Historical Provider, MD    Physical Exam: Vitals:   06/23/16 1230 06/23/16 1300 06/23/16 1330 06/23/16 1400  BP: 107/59 121/60 117/61 (!) 107/52  Pulse: 64 75 75 70  Resp: 13 15 15 26   Temp:      TempSrc:      SpO2: 99% 99% 99% 99%  Weight:      Height:         Constitutional:  . Appears calm and comfortable Eyes:  . PERRL and irises appear normal . Normal conjunctivae and lids ENMT:  . Lips and tongue appear unremarkable . grossly normal hearing Neck:  . neck appears normal, no masses . no thyromegaly Respiratory:  . CTA bilaterally, no w/r/r.  . Respiratory effort normal. No retractions or accessory muscle use Cardiovascular:  . RRR, no m/r/g . No LE extremity edema   Abdomen:  . Abdomen appears normal; no tenderness or masses . No hernias noted Musculoskeletal:  . Right upper, right lower, left upper, left lower extremities appear unremarkable grossly normal tone and strength, no abnormal movements, range of movement appears grossly normal Skin:  . No rashes, lesions, ulcers . palpation of skin: no induration or nodules Psychiatric:  . judgement and insight  appear normal . Mental status o Mood, affect appropriate  Wt Readings from Last 3 Encounters:  06/23/16 105.7 kg (233 lb)  05/20/16 111.1 kg (245 lb)  02/05/16 113.4 kg (250 lb)    I have personally reviewed following labs and imaging studies  Labs on Admission:  CBC:  Recent Labs Lab 06/21/16 1413 06/23/16 1240  WBC 11.7* 8.7  NEUTROABS 8.1*  --   HGB  --  9.4*  HCT 36.8* 28.3*  MCV 93 94.6  PLT 231 224   Basic Metabolic Panel:  Recent Labs Lab 06/23/16 1240  NA 135  K 4.4  CL 103  CO2 28  GLUCOSE 184*  BUN 40*  CREATININE 1.79*  CALCIUM 8.6*   Liver Function Tests:  Recent Labs Lab 06/23/16 1240  AST 19  ALT 43  ALKPHOS 78  BILITOT 0.8  PROT 5.6*  ALBUMIN 3.1*   Urine analysis:    Component Value Date/Time   COLORURINE YELLOW 02/04/2014 0939   APPEARANCEUR CLEAR 02/04/2014 0939   LABSPEC 1.015 02/04/2014 0939   PHURINE 7.0 02/04/2014 0939   GLUCOSEU NEGATIVE 02/04/2014 0939   HGBUR NEGATIVE 02/04/2014 0939   BILIRUBINUR NEGATIVE 02/04/2014 0939   KETONESUR NEGATIVE 02/04/2014 0939   PROTEINUR NEGATIVE 02/04/2014 0939    UROBILINOGEN 0.2 02/04/2014 0939   NITRITE NEGATIVE 02/04/2014 0939   LEUKOCYTESUR NEGATIVE 02/04/2014 4098       Principal Problem:   Lower GI bleed Active Problems:   Chronic systolic heart failure (HCC)   Automatic implantable cardioverter-defibrillator in situ   Acute blood loss anemia   Diabetes mellitus, type 2 (HCC)   Atrial fibrillation (HCC)   Assessment/Plan 1. LGIB, likely diverticular in nature. No bleeding now for close to 24 hours. 2. Symptomatic ABLA secondary to above. 3. Ischemic cardiomyopathy, ICD in place, Afib, CHA2DS2-VASc = 5 on Eliquis. Stable. 4. DM type 2, stable   Hemodynamics are stable. Plan medical bed.  Serial CBC. Transfuse for hemoglobin less than 7 or recurrent bleeding.  GI consultation.  Continue to hold anticoagulation.  Continue Coreg, Imdur, Lantus  DVT prophylaxis: SCDs Code Status: full code Family Communication: wife and granddaughter   Consults called: GI   Admission status: admit    It is my clinical opinion that referral for OBSERVATION is reasonable and necessary in this patient  . presenting with symptoms of bleeding and dizziness, concerning for lower GI bleed and acute blood loss anemia . in the context of PMH including atrial fibrillation currently on anticoagulation, diverticulosis . and pertinent laboratory data including 3 g hemoglobin drop.  The aforementioned taken together are felt to place the patient at high risk for further clinical deterioration. However it is anticipated that the patient may be medically stable for discharge from the hospital within 24 to 48 hours.   Time spent: 50 minutes  Brendia Sacks, MD  [facility] Direct contact: [phone] --Via amion app OR  --[email or web address]; password [email or web address]  7PM-7AM contact night coverage as above  06/23/2016, 2:05 PM

## 2016-06-24 ENCOUNTER — Encounter (HOSPITAL_COMMUNITY): Payer: Self-pay

## 2016-06-24 ENCOUNTER — Encounter (HOSPITAL_COMMUNITY)
Admission: EM | Disposition: A | Discharge status: Home / Self Care | Payer: Self-pay | Source: Home / Self Care | Attending: Emergency Medicine

## 2016-06-24 ENCOUNTER — Observation Stay (HOSPITAL_COMMUNITY): Payer: Medicare HMO

## 2016-06-24 DIAGNOSIS — D62 Acute posthemorrhagic anemia: Secondary | ICD-10-CM | POA: Diagnosis not present

## 2016-06-24 DIAGNOSIS — K5791 Diverticulosis of intestine, part unspecified, without perforation or abscess with bleeding: Secondary | ICD-10-CM | POA: Diagnosis not present

## 2016-06-24 DIAGNOSIS — K449 Diaphragmatic hernia without obstruction or gangrene: Secondary | ICD-10-CM

## 2016-06-24 DIAGNOSIS — N183 Chronic kidney disease, stage 3 (moderate): Secondary | ICD-10-CM | POA: Diagnosis not present

## 2016-06-24 DIAGNOSIS — E1122 Type 2 diabetes mellitus with diabetic chronic kidney disease: Secondary | ICD-10-CM | POA: Diagnosis not present

## 2016-06-24 DIAGNOSIS — K921 Melena: Secondary | ICD-10-CM | POA: Diagnosis not present

## 2016-06-24 HISTORY — PX: ESOPHAGOGASTRODUODENOSCOPY: SHX5428

## 2016-06-24 LAB — GLUCOSE, CAPILLARY
GLUCOSE-CAPILLARY: 114 mg/dL — AB (ref 65–99)
GLUCOSE-CAPILLARY: 277 mg/dL — AB (ref 65–99)
GLUCOSE-CAPILLARY: 211 mg/dL — AB (ref 65–99)
Glucose-Capillary: 127 mg/dL — ABNORMAL HIGH (ref 65–99)
Glucose-Capillary: 146 mg/dL — ABNORMAL HIGH (ref 65–99)

## 2016-06-24 LAB — CBC
HCT: 22.4 % — ABNORMAL LOW (ref 39.0–52.0)
HEMATOCRIT: 25.6 % — AB (ref 39.0–52.0)
HEMATOCRIT: 24.4 % — AB (ref 39.0–52.0)
HEMOGLOBIN: 8.3 g/dL — AB (ref 13.0–17.0)
HEMOGLOBIN: 7.7 g/dL — AB (ref 13.0–17.0)
HEMOGLOBIN: 8.3 g/dL — AB (ref 13.0–17.0)
MCH: 31.8 pg (ref 26.0–34.0)
MCH: 31.9 pg (ref 26.0–34.0)
MCH: 31.3 pg (ref 26.0–34.0)
MCHC: 34 g/dL (ref 30.0–36.0)
MCHC: 34.4 g/dL (ref 30.0–36.0)
MCHC: 32.4 g/dL (ref 30.0–36.0)
MCV: 92.6 fL (ref 78.0–100.0)
MCV: 93.8 fL (ref 78.0–100.0)
MCV: 96.6 fL (ref 78.0–100.0)
PLATELETS: 216 10*3/uL (ref 150–400)
Platelets: 185 10*3/uL (ref 150–400)
Platelets: 203 10*3/uL (ref 150–400)
RBC: 2.6 MIL/uL — AB (ref 4.22–5.81)
RBC: 2.42 MIL/uL — AB (ref 4.22–5.81)
RBC: 2.65 MIL/uL — AB (ref 4.22–5.81)
RDW: 13.9 % (ref 11.5–15.5)
RDW: 14.3 % (ref 11.5–15.5)
RDW: 14.6 % (ref 11.5–15.5)
WBC: 7.1 10*3/uL (ref 4.0–10.5)
WBC: 6.3 10*3/uL (ref 4.0–10.5)
WBC: 7.5 10*3/uL (ref 4.0–10.5)

## 2016-06-24 SURGERY — EGD (ESOPHAGOGASTRODUODENOSCOPY)
Anesthesia: Moderate Sedation

## 2016-06-24 MED ORDER — LIDOCAINE VISCOUS 2 % MT SOLN
OROMUCOSAL | Status: DC | PRN
  Administered 2016-06-24: 1 via OROMUCOSAL

## 2016-06-24 MED ORDER — STERILE WATER FOR IRRIGATION IR SOLN
Status: DC | PRN
  Administered 2016-06-24: 12:00:00

## 2016-06-24 MED ORDER — ONDANSETRON HCL 4 MG/2ML IJ SOLN
INTRAMUSCULAR | Status: AC
  Filled 2016-06-24: qty 2

## 2016-06-24 MED ORDER — LIDOCAINE VISCOUS 2 % MT SOLN
OROMUCOSAL | Status: AC
  Filled 2016-06-24: qty 15

## 2016-06-24 MED ORDER — ONDANSETRON HCL 4 MG/2ML IJ SOLN
INTRAMUSCULAR | Status: DC | PRN
  Administered 2016-06-24: 4 mg via INTRAVENOUS

## 2016-06-24 MED ORDER — ROSUVASTATIN CALCIUM 20 MG PO TABS
20.0000 mg | ORAL_TABLET | Freq: Every day | ORAL | Status: DC
  Administered 2016-06-24: 20 mg via ORAL
  Filled 2016-06-24: qty 1

## 2016-06-24 MED ORDER — MEPERIDINE HCL 100 MG/ML IJ SOLN
INTRAMUSCULAR | Status: AC
  Filled 2016-06-24: qty 2

## 2016-06-24 MED ORDER — MIDAZOLAM HCL 5 MG/5ML IJ SOLN
INTRAMUSCULAR | Status: DC | PRN
  Administered 2016-06-24: 1 mg via INTRAVENOUS
  Administered 2016-06-24: 2 mg via INTRAVENOUS

## 2016-06-24 MED ORDER — SODIUM CHLORIDE 0.9 % IV SOLN
INTRAVENOUS | Status: DC
  Administered 2016-06-24: 11:00:00 via INTRAVENOUS

## 2016-06-24 MED ORDER — MEPERIDINE HCL 100 MG/ML IJ SOLN
INTRAMUSCULAR | Status: DC | PRN
  Administered 2016-06-24: 25 mg via INTRAVENOUS

## 2016-06-24 MED ORDER — MIDAZOLAM HCL 5 MG/5ML IJ SOLN
INTRAMUSCULAR | Status: AC
  Filled 2016-06-24: qty 10

## 2016-06-24 MED ORDER — FAMOTIDINE IN NACL 20-0.9 MG/50ML-% IV SOLN
20.0000 mg | Freq: Two times a day (BID) | INTRAVENOUS | Status: DC
  Administered 2016-06-24 (×2): 20 mg via INTRAVENOUS
  Filled 2016-06-24 (×3): qty 50

## 2016-06-24 MED ORDER — GLIPIZIDE ER 5 MG PO TB24
10.0000 mg | ORAL_TABLET | Freq: Two times a day (BID) | ORAL | Status: DC
  Administered 2016-06-24: 10 mg via ORAL
  Filled 2016-06-24: qty 2

## 2016-06-24 NOTE — Care Management Obs Status (Signed)
MEDICARE OBSERVATION STATUS NOTIFICATION   Patient Details  Name: Wayne Gordon MRN: [id number] Date of Birth: 03-Apr-1934   Medicare Observation Status Notification Given:  Yes    Malcolm Metro, RN 06/24/2016, 8:17 AM

## 2016-06-24 NOTE — Progress Notes (Signed)
PROGRESS NOTE  Wayne Mainlandim M Dechaine NWG:[id number] DOB: 05/25/1934 DOA: 06/23/2016 PCP: Wayne JesterYNTHIA [name], DO  Brief Narrative: 82 year old man PMH diverticulosis presented with bloody stools for 4 days in the context of Eliquis use. Was noted to be anemic and referred for further evaluation of lower GI bleed.   Assessment/Plan: 1. GI bleed. Started with red blood per rectum. None for 36 hours. Diverticular suspected. Upper GI bleed seems less likely also considered.. GI plans upper endoscopy. 2. Symptomatic ABLA, hemoglobin trending down, likely equilibrating. No active bleeding. Now asymptomatic. 3. Ischemic cardiomyopathy, ICD in place, Afib, CHA2DS2-VASc = 5 on Eliquis. Stable. 4. Chronic kidney disease stage III. GFR of 30-59 ml/min. Creatinine appears to be at baseline. Will follow.  5. DM type 2, sugars are stable. On lantus and sliding scale insulin.  6. Essential hypertension. Pressures are borderline low. She is on a betablocker.  7. Hyperlipidemia. Continue Crestor.    Hemodynamics remain stable, no active bleeding. Hemoglobin may be equilibrating. Consider transfusion if hemoglobin drops further. Continue H2 blocker. Follow-up GI intervention and recommendations. Continue to hold anticoagulation.  If remains stable would anticipate discharge next 24 hours.  DVT prophylaxis: Eliquis  Code Status: FULL  Family Communication: Discussed with wife bedside Disposition Plan: Discharge home today   Wayne Sacksaniel Goodrich, MD  [facility] Direct contact: [phone] --Via amion app OR  --[email or web address]; password [email or web address]  7PM-7AM contact night coverage as above 06/24/2016, 6:58 AM  LOS: 0 days   Consultants:  Gastroenterology   Procedures:  None   Antimicrobials:  None   CC: Follow-up GI bleed   Interval history/Subjective: No bleeding since admission, no nausea, no vomiting. No dizziness with ambulation today.  ROS: No shortness of breath   Objective: Vitals:   06/23/16  1530 06/23/16 1600 06/23/16 2005 06/24/16 0520  BP: (!) 111/49 113/61 (!) 102/48 (!) 100/54  Pulse: 70 87 77 80  Resp: 17 20 18 17   Temp:   98.2 F (36.8 C) 98 F (36.7 C)  TempSrc:   Oral Oral  SpO2: 99% 99% 98% 98%  Weight:   107.7 kg (237 lb 8 oz)   Height:        Intake/Output Summary (Last 24 hours) at 06/24/16 0658 Last data filed at 06/23/16 2200  Gross per 24 hour  Intake                3 ml  Output                0 ml  Net                3 ml     Filed Weights   06/23/16 1144 06/23/16 2005  Weight: 105.7 kg (233 lb) 107.7 kg (237 lb 8 oz)    Exam:  Constitutional:  . Appears calm and comfortable Respiratory:  . CTA bilaterally, no w/r/r.  . Respiratory effort normal. No retractions or accessory muscle use Cardiovascular:  . RRR, no m/r/g . No LE extremity edema   Abdomen:   soft, non tender, and non distended.   I have personally reviewed following labs and imaging studies:  Hemoglobin 7.7, Occult blood test was positive.   Scheduled Meds: . carvedilol  12.5 mg Oral BID WC  . feeding supplement  1 Container Oral TID BM  . insulin aspart  0-5 Units Subcutaneous QHS  . insulin glargine  12 Units Subcutaneous Daily  . isosorbide mononitrate  30 mg Oral Daily  . sodium chloride flush  3  mL Intravenous Q12H   Continuous Infusions:   Principal Problem:   Gastrointestinal hemorrhage associated with intestinal diverticulosis Active Problems:   Chronic systolic heart failure (HCC)   Automatic implantable cardioverter-defibrillator in situ   Acute blood loss anemia   Diabetes mellitus, type 2 (HCC)   Atrial fibrillation (HCC)   LOS: 0 days   Time spent 25 minutes       By signing my name below, I, Wayne Gordon, attest that this documentation has been prepared under the direction and in the presence of Wayne Sacks, MD. Electronically signed: Cynda Gordon, Scribe. 06/24/16 8:26 AM   I personally performed the services described in this  documentation. All medical record entries made by the scribe were at my direction. I have reviewed the chart and agree that the record reflects my personal performance and is accurate and complete. Wayne Sacks, MD

## 2016-06-24 NOTE — Progress Notes (Signed)
Gallbladder ultrasound reveals cholelithiasis with ULN gallbladder wall thickness. No pericholecystic fluid. I suspect bouts of epigastric and right upper quadrant abdominal pain emanating from gallbladder. Follow-up hemoglobin 8.3. No bleeding in almost 48 hours.  I suspect self-limiting diverticular bleed given EGD findings. We'll go ahead and advanced to a carbohydrate modified diet this evening. I will also arrange follow-up with [place] in the office in a few weeks and orchestrate an elective appointment with one of the surgeons at [facility] (patient's choice). Patient reports that he is going to see his cardiologist, Dr. Ladona Ridgel on November 13. I feel it would be reasonable to continue to hold his anticoagulation therapy until he sees his cardiologist.   I have discussed with Dr. Irene Limbo.

## 2016-06-24 NOTE — Progress Notes (Signed)
Patient requested home meds Glipizide and Crestor.  Paged Dr. Irene Limbo and received order for these meds.

## 2016-06-24 NOTE — Op Note (Signed)
[facility] Patient Name: Wayne Gordon Procedure Date: 06/24/2016 11:27 AM MRN: [id number] Date of Birth: 1934-03-24 Attending MD: Gennette Pac , MD CSN: [id number] Age: 82 Admit Type: Outpatient Procedure:                Upper GI endoscopy Indications:              Hematochezia, Melena Providers:                Gennette Pac, MD, Cynda Acres, RN, Birder Robson, Technician Referring MD:              Medicines:                Midazolam 3 mg IV, Meperidine 25 mg IV, Ondansetron                            4 mg IV Complications:            No immediate complications. Estimated Blood Loss:     Estimated blood loss: none. Procedure:                Pre-Anesthesia Assessment:                           - Prior to the procedure, a History and Physical                            was performed, and patient medications and                            allergies were reviewed. The patient's tolerance of                            previous anesthesia was also reviewed. The risks                            and benefits of the procedure and the sedation                            options and risks were discussed with the patient.                            All questions were answered, and informed consent                            was obtained. Prior Anticoagulants: The patient                            last took Eliquis (apixaban) 3 days prior to the                            procedure. ASA Grade Assessment: III - A patient  with severe systemic disease. After reviewing the                            risks and benefits, the patient was deemed in                            satisfactory condition to undergo the procedure.                           After obtaining informed consent, the endoscope was                            passed under direct vision. Throughout the                            procedure, the patient's blood  pressure, pulse, and                            oxygen saturations were monitored continuously. The                            [id number] ([id number] scope was introduced through the                            mouth, and advanced to the second part of duodenum.                            The upper GI endoscopy was accomplished without                            difficulty. The patient tolerated the procedure                            well. Scope In: 11:35:45 AM Scope Out: 11:39:33 AM Total Procedure Duration: 0 hours 3 minutes 48 seconds  Findings:      The examined esophagus was normal.      A small hiatal hernia was present.      The duodenal bulb and second portion of the duodenum were normal. No       blood in the upper GI tract. No bleeding lesion found in the upper GI       tract. Impression:               - Normal esophagus.                           - Small hiatal hernia.                           - Normal duodenal bulb and second portion of the                            duodenum.                           - No specimens collected. I  suspect self-limiting                            diverticular bleed?"has been nearly 48 hours since                            last stool. Hemoglobin down to 7.7 this morning. He                            may need a transfusion prior to discharge. Patient                            does not want to have a colonoscopy at this time if                            he can avoid it. Both patient and wife more                            concerned about intermittent upper abdominal pain                            he's had on occasion recently. Last CT of the                            abdomen 2015. No recent gallbladder                            ultrasound?"remains in situ. Moderate Sedation:      Moderate (conscious) sedation was administered by the endoscopy nurse       and supervised by the endoscopist. The following parameters were       monitored:  oxygen saturation, heart rate, blood pressure, respiratory       rate, EKG, adequacy of pulmonary ventilation, and response to care.       Total physician intraservice time was 9 minutes. Recommendation:           - Return patient to hospital ward for ongoing care.                           - Clear liquid diet (after gallbladder ultrasound).                           - Continue present medications.                           - Resume Eliquis (apixaban) at prior dose in 1                            week. Gallbladder ultrasound today. Transfusion of                            packed RBCs per hospitalist as needed. No need for  urgent colonoscopy at this time. Procedure Code(s):        --- Professional ---                           [phone], Esophagogastroduodenoscopy, flexible,                            transoral; diagnostic, including collection of                            specimen(s) by brushing or washing, when performed                            (separate procedure) Diagnosis Code(s):        --- Professional ---                           K44.9, Diaphragmatic hernia without obstruction or                            gangrene                           K92.1, Melena (includes Hematochezia) CPT copyright 2016 American Medical Association. All rights reserved. The codes documented in this report are preliminary and upon coder review may  be revised to meet current compliance requirements. [name]. [name], MD Gennette Pac, MD 06/24/2016 12:01:23 PM This report has been signed electronically. Number of Addenda: 0

## 2016-06-25 DIAGNOSIS — D62 Acute posthemorrhagic anemia: Secondary | ICD-10-CM | POA: Diagnosis not present

## 2016-06-25 DIAGNOSIS — N183 Chronic kidney disease, stage 3 (moderate): Secondary | ICD-10-CM | POA: Diagnosis not present

## 2016-06-25 DIAGNOSIS — K5791 Diverticulosis of intestine, part unspecified, without perforation or abscess with bleeding: Secondary | ICD-10-CM | POA: Diagnosis not present

## 2016-06-25 DIAGNOSIS — E1122 Type 2 diabetes mellitus with diabetic chronic kidney disease: Secondary | ICD-10-CM | POA: Diagnosis not present

## 2016-06-25 LAB — GLUCOSE, CAPILLARY: GLUCOSE-CAPILLARY: 156 mg/dL — AB (ref 65–99)

## 2016-06-25 NOTE — Discharge Summary (Signed)
Physician Discharge Summary  Wayne Mainlandim M Gordon [id number] DOB: 03/09/1934 DOA: 06/23/2016  PCP: Samuel JesterYNTHIA [name], DO  Admit date: 06/23/2016 Discharge date: 06/25/2016  Recommendations for Outpatient Follow-up:  1. Lower GI bleed with resulting acute blood loss anemia, now asymptomatic. LE was currently on hold until follow-up with his cardiologist. 2. He has follow-up already arranged with his cardiologist November 13. At that time would consider restarting anticoagulation.     Follow-up Information    [name] [name], DO .   Why:  as needed Contact information: [address] [place] [address] [phone]          Discharge Diagnoses:  1. Lower GI bleed, presumed diverticular in nature 2. Symptomatically acute blood loss anemia 3. Chronic kidney disease stage III 4. Diabetes mellitus type 2 5. Atrial fibrillation  Discharge Condition: improved Disposition: home  Diet recommendation: heart healthy, diabetic  Filed Weights   06/23/16 1144 06/23/16 2005  Weight: 105.7 kg (233 lb) 107.7 kg (237 lb 8 oz)    History of present illness:  82 year old man PMH diverticulosis presented with bloody stools for 4 days in the context of Eliquis use.Was noted to be anemic and referred for further evaluation of lower GI bleed.   Hospital Course:  Patient was observed, followed by gastroenterology, no bleeding during this hospitalization. Upper endoscopy was unremarkable. Presumed lower GI bleed resulting in anemia. Asymptomatic. Did not require blood products. Hospitalization was uncomplicated. Plan hold eloquence until follow-up with his cardiologist  Consultants:  Gastroenterology   Procedures:  EGD Impression:               - Normal esophagus.                           - Small hiatal hernia.                           - Normal duodenal bulb and second portion of the                            duodenum.                           - No specimens collected. I suspect  self-limiting                            diverticular bleed?"has been nearly 48 hours since                            last stool. Hemoglobin down to 7.7 this morning. He                            may need a transfusion prior to discharge. Patient                            does not want to have a colonoscopy at this time if                            he can avoid it. Both patient and wife more  concerned about intermittent upper abdominal pain                            he's had on occasion recently. Last CT of the                            abdomen 2015. No recent gallbladder                            ultrasound?"remains in situ.  Antimicrobials:  None   Discharge Instructions  Discharge Instructions    Diet - low sodium heart healthy    Complete by:  As directed    Discharge instructions    Complete by:  As directed    Call your physician or seek immediate medical attention for bleeding, dizziness, pain, passing out or worsening of condition.   Increase activity slowly    Complete by:  As directed        Medication List    STOP taking these medications   ELIQUIS 5 MG Tabs tablet Generic drug:  apixaban     TAKE these medications   CALCIUM-MAGNESIUM-ZINC PO Take 2 tablets by mouth daily.   carvedilol 25 MG tablet Commonly known as:  COREG Take 0.5 tablets (12.5 mg total) by mouth 2 (two) times daily with a meal.   DEPO-TESTOSTERONE 200 MG/ML injection Generic drug:  testosterone cypionate Inject 0.5 mg into the muscle every 21 ( twenty-one) days. INJECT 0.6ML- 0.8ML EVERY 2 WEEKS   furosemide 20 MG tablet Commonly known as:  LASIX Take 20 mg tablet by month (1 tablet) alternating with 40 mg (2 tablets) every other day.   glipiZIDE 10 MG 24 hr tablet Commonly known as:  GLUCOTROL XL Take 10 mg by mouth 2 times daily at 12 noon and 4 pm. Pt takes it in the AM and PM   isosorbide mononitrate 30 MG 24 hr tablet Commonly known as:   IMDUR Take 1 tablet (30 mg total) by mouth daily.   losartan 25 MG tablet Commonly known as:  COZAAR Take 1 tablet (25 mg total) by mouth daily.   multivitamins ther. w/minerals Tabs tablet Take 1 tablet by mouth daily.   omeprazole 20 MG capsule Commonly known as:  PRILOSEC Take 20 mg by mouth daily.   rosuvastatin 20 MG tablet Commonly known as:  CRESTOR Take 20 mg by mouth every evening.   TOUJEO SOLOSTAR 300 UNIT/ML Sopn Generic drug:  Insulin Glargine Inject 12 Units as directed daily.      Allergies  Allergen Reactions  . Ace Inhibitors Swelling and Other (See Comments)    Kidney issues; angioedema per PMH in chart  . Penicillins Palpitations   The results of significant diagnostics from this hospitalization (including imaging, microbiology, ancillary and laboratory) are listed below for reference.    Significant Diagnostic Studies: US Abdomen Limited Ruq  Result Date: 06/24/2016 CLINICAL DATA:  Intermittent epigastric pain. Brief abdominal pain with bloody stools. Previous CABG. EXAM: US ABDOMEN LIMITED - RIGHT UPPER QUADRANT COMPARISON:  CT abdomen and pelvis on 02/03/2014 FINDINGS: Gallbladder: Multiple gallstones are present, measuring up to 8 mm in diameter. Sludge is also noted. Gallbladder wall is upper limits normal at 3 mm. No pericholecystic fluid or sonographic Murphy's sign identified. Common bile duct: Diameter: 2.8 mm Liver: No focal liver lesions are identified. Hepatic echotexture is normal. Study  quality is degraded by significant bowel gas following endoscopy. IMPRESSION: 1. Numerous gallstones and tumefactive sludge. Gallbladder wall is upper normal in thickness. 2. No sonographic Murphy's sign Electronically Signed   By: Norva Pavlov M.D.   On: 06/24/2016 13:05    Labs: Basic Metabolic Panel:  Recent Labs Lab 06/23/16 1240  NA 135  K 4.4  CL 103  CO2 28  GLUCOSE 184*  BUN 40*  CREATININE 1.79*  CALCIUM 8.6*   Liver Function  Tests:  Recent Labs Lab 06/23/16 1240  AST 19  ALT 43  ALKPHOS 78  BILITOT 0.8  PROT 5.6*  ALBUMIN 3.1*   CBC:  Recent Labs Lab 06/21/16 1413 06/23/16 1240 06/23/16 1931 06/24/16 0055 06/24/16 0809 06/24/16 1413  WBC 11.7* 8.7 8.7 7.5 6.3 7.1  NEUTROABS 8.1*  --   --   --   --   --   HGB  --  9.4* 8.8* 7.7* 8.3* 8.3*  HCT 36.8* 28.3* 25.6* 22.4* 25.6* 24.4*  MCV 93 94.6 93.1 92.6 96.6 93.8  PLT 231 224 196 185 216 203    CBG:  Recent Labs Lab 06/24/16 0047 06/24/16 0802 06/24/16 1215 06/24/16 1722 06/24/16 2221  GLUCAP 146* 114* 127* 277* 211*    Principal Problem:   Gastrointestinal hemorrhage associated with intestinal diverticulosis Active Problems:   Chronic systolic heart failure (HCC)   Automatic implantable cardioverter-defibrillator in situ   Acute blood loss anemia   Diabetes mellitus, type 2 (HCC)   Atrial fibrillation (HCC)   Time coordinating discharge: 20 minutes  Signed:  Brendia Sacks, MD [facility] 06/25/2016, 10:06 AM

## 2016-06-26 NOTE — Progress Notes (Signed)
Discharge instructions given, verbalized understanding, out in stable condition via w/c with staff. 

## 2016-06-29 ENCOUNTER — Encounter (HOSPITAL_COMMUNITY): Payer: Self-pay | Admitting: Internal Medicine

## 2016-07-04 ENCOUNTER — Other Ambulatory Visit: Payer: Self-pay

## 2016-07-04 ENCOUNTER — Telehealth: Payer: Self-pay

## 2016-07-04 DIAGNOSIS — K8021 Calculus of gallbladder without cholecystitis with obstruction: Secondary | ICD-10-CM

## 2016-07-04 NOTE — Telephone Encounter (Signed)
-----   Message from Royetta Crochet [facility] sent at 07/01/2016 10:41 AM EDT -----   ----- Message ----- From: Corbin Ade, MD Sent: 07/01/2016   8:25 AM To: Royetta Crochet [place]  Patient needs an appointment with [facility]-anyone there for consultation for cholecystectomy (cholelithiasis). We are going to facilitate the surgical consultation for him. Also, he should have an office visit with [place] in about 4 weeks.

## 2016-07-04 NOTE — Telephone Encounter (Signed)
Referral info faxed to [facility]. 

## 2016-07-04 NOTE — Telephone Encounter (Signed)
Called pt to inform of appt at [facility] 07/11/16 at 2:50pm he was already aware.

## 2016-07-05 ENCOUNTER — Other Ambulatory Visit: Payer: Self-pay | Admitting: Nurse Practitioner

## 2016-07-05 ENCOUNTER — Other Ambulatory Visit: Payer: Self-pay

## 2016-07-05 ENCOUNTER — Encounter: Payer: Self-pay | Admitting: Nurse Practitioner

## 2016-07-05 ENCOUNTER — Ambulatory Visit: Payer: Medicare HMO | Admitting: Internal Medicine

## 2016-07-05 ENCOUNTER — Ambulatory Visit (INDEPENDENT_AMBULATORY_CARE_PROVIDER_SITE_OTHER): Payer: Medicare HMO | Admitting: Nurse Practitioner

## 2016-07-05 VITALS — BP 118/66 | HR 80 | Temp 97.7°F | Ht 77.0 in | Wt 242.8 lb

## 2016-07-05 DIAGNOSIS — D62 Acute posthemorrhagic anemia: Secondary | ICD-10-CM

## 2016-07-05 DIAGNOSIS — K625 Hemorrhage of anus and rectum: Secondary | ICD-10-CM

## 2016-07-05 DIAGNOSIS — K5791 Diverticulosis of intestine, part unspecified, without perforation or abscess with bleeding: Secondary | ICD-10-CM | POA: Diagnosis not present

## 2016-07-05 DIAGNOSIS — R1013 Epigastric pain: Secondary | ICD-10-CM

## 2016-07-05 NOTE — Assessment & Plan Note (Signed)
No further bleeding since discharge. Hgb was trending up at that time. Has an appt on 07/11/16 with cardiology to consider restarting Eliquis and with [facility] for consideration of cholecystectomy. Will have CBC drawn 2 week post-d/c, before those appointments, for follow-up. Return for follow-up 2 months. Call urgently if any further bleeding.

## 2016-07-05 NOTE — Progress Notes (Signed)
Referring Provider: Samuel Jester, DO Primary Care Physician:  Samuel Jester, DO Primary GI:  Dr. Jena Gauss  Chief Complaint  Patient presents with  . Follow-up    no problems at this time, recently in hosp for diverticulitis    HPI:   Wayne Gordon is a 82 y.o. male who presents for follow-up on slight anemia. The patient was last seen in our office on 02/18/2015 for GERD and epigastric pain. Pain noted related to by mouth intake and improved on omeprazole. Gallbladder appeared normal on CT patient declined abdominal ultrasound for gallstones. At that time omeprazole was increased to twice daily. Recommended 6 month follow-up office visit.  She called on 06/20/2016 complaining of bleeding, CBC was ordered which found hemoglobin decreased to 12.4. The patient was referred to the emergency department and was admitted for diverticulosis and bloody stools for 4 days in the setting of Eliquis use. No bleeding during hospitalization, upper endoscopy unremarkable and suspect self-limiting diverticular bleed with hemoglobin as low as 7.7. Did not require blood transfusion. An order was also put in for referral to [facility] surgery for consultation for cholecystectomy with cholelithiasis due to right upper quadrant Doppler ultrasound which found numerous gallstones and sludge, gallbladder wall upper normal in thickness.  Today he states he's doing well overall. No further bleeding since discharge. Occasional abdominal pain, which can last up to 8 hours and typically self-resolves. He is not back on Eliquis as he hasn't seen cardiologist. Has an appointment with Dr. Ladona Ridgel in Cardiology on 11/13. Denies N/V, melena. Denies chest pain, dyspnea, dizziness, lightheadedness, syncope, near syncope. Denies any other upper or lower GI symptoms.  Past Medical History:  Diagnosis Date  . Allergy to ACE inhibitors    Angioedema many years ago; patient has tolerated Losartan (ARB) in the past - it was  held during [facility] for GI bleed and worsening renal function >> resume Losartan 25 mg QD 06/2015  . Arthritis   . Atrial fibrillation (HCC)    PAF, CHADs2Vasc = 5  . Carotid artery disease (HCC)    60-79% LICA  . CHF (congestive heart failure) (HCC)   . Coronary artery disease   . Diabetes mellitus   . GERD (gastroesophageal reflux disease)   . Hyperlipidemia   . Hypertension   . ICD (implantable cardiac defibrillator) in place 03/28/2012   Biotronik, Dr. Ladona Ridgel 03/28/12  . Ischemic cardiomyopathy    S/P CABG x 3; EF 25%  . Mitral regurgitation    S/P mitral valve repair 2013  . Myocardial infarction   . Renal insufficiency     Past Surgical History:  Procedure Laterality Date  . COLONOSCOPY N/A 04/24/2014   RMR Pancolonic diverticulosis  . CORONARY ARTERY BYPASS GRAFT  10/17/2011   Procedure: CORONARY ARTERY BYPASS GRAFTING (CABG);  Surgeon: Purcell Nails, MD;  [facility] OR;  Service: Open Heart Surgery;  Laterality: N/A;  . ESOPHAGOGASTRODUODENOSCOPY N/A 04/24/2014   RMR Subtle nodularity the gastric mucosa of uncertain significance-status post biopsy. Hiatal hernia. chronic inflammation, no H.pylori  . ESOPHAGOGASTRODUODENOSCOPY N/A 06/24/2016   Procedure: ESOPHAGOGASTRODUODENOSCOPY (EGD);  Surgeon: Corbin Ade, MD;  [facility];  Service: Endoscopy;  Laterality: N/A;  . ICD  03/28/2012  . IMPLANTABLE CARDIOVERTER DEFIBRILLATOR IMPLANT N/A 03/28/2012   Procedure: IMPLANTABLE CARDIOVERTER DEFIBRILLATOR IMPLANT;  Surgeon: Marinus Maw, MD;  Location: [facility] CATH LAB;  Service: Cardiovascular;  Laterality: N/A;  . KNEE ARTHROSCOPY  ~ 2008   right  . LEFT AND  RIGHT HEART CATHETERIZATION WITH CORONARY ANGIOGRAM N/A 10/12/2011   Procedure: LEFT AND RIGHT HEART CATHETERIZATION WITH CORONARY ANGIOGRAM;  Surgeon: Iran OuchMuhammad A Arida, MD;  Location: [facility] CATH LAB;  Service: Cardiovascular;  Laterality: N/A;  . MITRAL VALVE REPAIR  10/17/2011   Procedure: MITRAL VALVE REPAIR  (MVR);  Surgeon: Purcell Nailslarence H Owen, MD;  [facility];  Service: Open Heart Surgery;  Laterality: N/A;  . NASAL SINUS SURGERY  1990's   right    Current Outpatient Prescriptions  Medication Sig Dispense Refill  . CALCIUM-MAGNESIUM-ZINC PO Take 2 tablets by mouth daily.    . carvedilol (COREG) 25 MG tablet Take 0.5 tablets (12.5 mg total) by mouth 2 (two) times daily with a meal. 135 tablet 6  . furosemide (LASIX) 20 MG tablet Take 20 mg tablet by month (1 tablet) alternating with 40 mg (2 tablets) every other day. 90 tablet 3  . glipiZIDE (GLUCOTROL XL) 10 MG 24 hr tablet Take 10 mg by mouth 2 times daily at 12 noon and 4 pm. Pt takes it in the AM and PM    . Insulin Glargine (TOUJEO SOLOSTAR) 300 UNIT/ML SOPN Inject 12 Units as directed daily.     . isosorbide mononitrate (IMDUR) 30 MG 24 hr tablet Take 1 tablet (30 mg total) by mouth daily. 30 tablet 11  . losartan (COZAAR) 25 MG tablet Take 1 tablet (25 mg total) by mouth daily. 30 tablet 11  . Multiple Vitamins-Minerals (MULTIVITAMINS THER. W/MINERALS) TABS Take 1 tablet by mouth daily.    [name] Kitchen. omeprazole (PRILOSEC) 20 MG capsule Take 20 mg by mouth daily.     . rosuvastatin (CRESTOR) 20 MG tablet Take 20 mg by mouth every evening.    . testosterone cypionate (DEPO-TESTOSTERONE) 200 MG/ML injection Inject 0.5 mg into the muscle every 21 ( twenty-one) days. INJECT 0.6ML- 0.8ML EVERY 2 WEEKS     No current facility-administered medications for this visit.     Allergies as of 07/05/2016 - Review Complete 07/05/2016  Allergen Reaction Noted  . Ace inhibitors Swelling and Other (See Comments) 02/03/2014  . Penicillins Palpitations 10/10/2011    Family History  Problem Relation Age of Onset  . Heart disease Mother   . Diabetes Father   . Cardiomyopathy Father   . Colon cancer Neg Hx     Social History   Social History  . Marital status: Married    Spouse name: N/A  . Number of children: N/A  . Years of education: N/A    Occupational History  . Retired    Social History Main Topics  . Smoking status: Never Smoker  . Smokeless tobacco: Never Used  . Alcohol use No  . Drug use: No  . Sexual activity: Yes   Other Topics Concern  . None   Social History Narrative  . None    Review of Systems: General: Negative for anorexia, weight loss, fever, chills, fatigue, weakness. ENT: Negative for hoarseness, difficulty swallowing. CV: Negative for chest pain, angina, palpitations, peripheral edema.  Respiratory: Negative for dyspnea at rest, cough, sputum, wheezing.  GI: See history of present illness. Endo: Negative for unusual weight change.  Heme: Negative for bruising or bleeding.   Physical Exam: BP 118/66   Pulse 80   Temp 97.7 F (36.5 C) (Oral)   Ht 6\' 5"  (1.956 m)   Wt 242 lb 12.8 oz (110.1 kg)   BMI 28.79 kg/m  General:   Alert and oriented. Pleasant and cooperative. Well-nourished and well-developed.  Eyes:  Without icterus, sclera clear and conjunctiva pink.  Ears:  Normal auditory acuity. Cardiovascular:  S1, S2 present without murmurs appreciated. Extremities without clubbing or edema. Respiratory:  Clear to auscultation bilaterally. No wheezes, rales, or rhonchi. No distress.  Gastrointestinal:  +BS, soft, non-tender and non-distended. No HSM noted. No guarding or rebound. No masses appreciated.  Rectal:  Deferred  Musculoskalatal:  Symmetrical without gross deformities. Neurologic:  Alert and oriented x4;  grossly normal neurologically. Psych:  Alert and cooperative. Normal mood and affect. Heme/Lymph/Immune: No excessive bruising noted.    07/05/2016 10:32 AM   Disclaimer: This note was dictated with voice recognition software. Similar sounding words can inadvertently be transcribed and may not be corrected upon review.

## 2016-07-05 NOTE — Assessment & Plan Note (Signed)
Persistent abdominal pain, abdominal ultrasound found multiple gallstones and high level normal although wall thickness. He has an appointment on 07/11/2016 with [facility] surgery for consideration of lap cholecystectomy.

## 2016-07-05 NOTE — Progress Notes (Signed)
cc'ed to pcp °

## 2016-07-05 NOTE — Assessment & Plan Note (Signed)
Acute blood loss anemia related to diverticular bleed. Lowest hemoglobin 7.7, rose to 8.3 on discharge. I will check a CBC 07/08/2016 which will be 2 weeks post discharge but also before he sees cardiology to consider restarting Eliquis and surgery for possible cholecystectomy. Return for follow-up in 2 months.

## 2016-07-05 NOTE — Patient Instructions (Signed)
1. Keep your appointment with surgery. 2. Keep your appointment with cardiology. 3. Have your blood drawn on Friday, 07/08/2016.  4. Call if any further bleeding is noticed. 5. Return for follow-up in 2 months.

## 2016-07-09 LAB — CBC WITH DIFFERENTIAL/PLATELET
BASOS ABS: 0 10*3/uL (ref 0.0–0.2)
Basos: 0 %
EOS (ABSOLUTE): 0.1 10*3/uL (ref 0.0–0.4)
EOS: 2 %
HEMATOCRIT: 30.2 % — AB (ref 37.5–51.0)
Hemoglobin: 9.4 g/dL — ABNORMAL LOW (ref 12.6–17.7)
IMMATURE GRANULOCYTES: 0 %
Immature Grans (Abs): 0 10*3/uL (ref 0.0–0.1)
Lymphocytes Absolute: 1.9 10*3/uL (ref 0.7–3.1)
Lymphs: 29 %
MCH: 29.4 pg (ref 26.6–33.0)
MCHC: 31.1 g/dL — AB (ref 31.5–35.7)
MCV: 94 fL (ref 79–97)
MONOS ABS: 0.7 10*3/uL (ref 0.1–0.9)
Monocytes: 11 %
NEUTROS PCT: 58 %
Neutrophils Absolute: 3.8 10*3/uL (ref 1.4–7.0)
PLATELETS: 286 10*3/uL (ref 150–379)
RBC: 3.2 x10E6/uL — AB (ref 4.14–5.80)
RDW: 15.3 % (ref 12.3–15.4)
WBC: 6.6 10*3/uL (ref 3.4–10.8)

## 2016-07-11 ENCOUNTER — Ambulatory Visit (INDEPENDENT_AMBULATORY_CARE_PROVIDER_SITE_OTHER): Payer: Medicare HMO | Admitting: Internal Medicine

## 2016-07-11 ENCOUNTER — Encounter: Payer: Self-pay | Admitting: Internal Medicine

## 2016-07-11 VITALS — BP 128/72 | HR 73 | Ht 77.0 in | Wt 241.0 lb

## 2016-07-11 DIAGNOSIS — I5022 Chronic systolic (congestive) heart failure: Secondary | ICD-10-CM

## 2016-07-11 DIAGNOSIS — I472 Ventricular tachycardia, unspecified: Secondary | ICD-10-CM

## 2016-07-11 DIAGNOSIS — I480 Paroxysmal atrial fibrillation: Secondary | ICD-10-CM | POA: Diagnosis not present

## 2016-07-11 LAB — CUP PACEART INCLINIC DEVICE CHECK
Battery Voltage: 3.11 V
Brady Statistic RV Percent Paced: 95 %
HIGH POWER IMPEDANCE MEASURED VALUE: 73 Ohm
Implantable Lead Location: 753860
Implantable Pulse Generator Implant Date: 20130731
Lead Channel Impedance Value: 520 Ohm
Lead Channel Sensing Intrinsic Amplitude: 17.7 mV
Lead Channel Sensing Intrinsic Amplitude: 2.5 mV
Lead Channel Setting Pacing Pulse Width: 0.4 ms
Lead Channel Setting Sensing Sensitivity: 0.8 mV
MDC IDC LEAD IMPLANT DT: 20130731
MDC IDC LEAD MODEL: 365501
MDC IDC LEAD SERIAL: 10505793
MDC IDC MSMT LEADCHNL RV PACING THRESHOLD AMPLITUDE: 0.6 V
MDC IDC MSMT LEADCHNL RV PACING THRESHOLD PULSEWIDTH: 0.4 ms
MDC IDC SESS DTM: 20171113085204
Pulse Gen Serial Number: 60687186

## 2016-07-11 NOTE — Progress Notes (Signed)
HPI Mr. Glenetta BorgSmothers returns today for followup. He is a very pleasant 82 year old man with an ischemic cardiomyopathy, chronic well compensated systolic heart failure, PAF, VT/VF, mild renal insufficiency, stage II, diabetes, and hypertension. In the interim, he has had lower GI bleeding and diagnosed with diverticulosis/itis. He has improved. His systemic anti-coagulation was stopped. He has also had some epigastric pain and found to have gall bladder disease. No fever or chills. No ICD shocks.   Current Outpatient Prescriptions  Medication Sig Dispense Refill  . CALCIUM-MAGNESIUM-ZINC PO Take 2 tablets by mouth daily.    . carvedilol (COREG) 25 MG tablet Take 0.5 tablets (12.5 mg total) by mouth 2 (two) times daily with a meal. 135 tablet 6  . furosemide (LASIX) 20 MG tablet Take 20 mg tablet by month (1 tablet) alternating with 40 mg (2 tablets) every other day. 90 tablet 3  . glipiZIDE (GLUCOTROL XL) 10 MG 24 hr tablet Take 10 mg by mouth 2 times daily at 12 noon and 4 pm. Pt takes it in the AM and PM    . Insulin Glargine (TOUJEO SOLOSTAR) 300 UNIT/ML SOPN Inject 12 Units as directed daily.     . isosorbide mononitrate (IMDUR) 30 MG 24 hr tablet Take 1 tablet (30 mg total) by mouth daily. 30 tablet 11  . losartan (COZAAR) 25 MG tablet Take 1 tablet (25 mg total) by mouth daily. 30 tablet 11  . Multiple Vitamins-Minerals (MULTIVITAMINS THER. W/MINERALS) TABS Take 1 tablet by mouth daily.    [name] Kitchen. omeprazole (PRILOSEC) 20 MG capsule Take 20 mg by mouth daily.     . rosuvastatin (CRESTOR) 20 MG tablet Take 20 mg by mouth every evening.    . testosterone cypionate (DEPO-TESTOSTERONE) 200 MG/ML injection Inject 0.5 mg into the muscle every 21 ( twenty-one) days. INJECT 0.6ML- 0.8ML EVERY 2 WEEKS     No current facility-administered medications for this visit.      Past Medical History:  Diagnosis Date  . Allergy to ACE inhibitors    Angioedema many years ago; patient has tolerated Losartan (ARB) in  the past - [facility] and [facility] function >> resume Losartan 25 mg QD 06/2015  . Arthritis   . Atrial fibrillation (HCC)    PAF, CHADs2Vasc = 5  . Carotid artery disease (HCC)    60-79% LICA  . CHF (congestive heart failure) (HCC)   . Coronary artery disease   . Diabetes mellitus   . GERD (gastroesophageal reflux disease)   . Hyperlipidemia   . Hypertension   . ICD (implantable cardiac defibrillator) in place 03/28/2012   Biotronik, Dr. Ladona Ridgelaylor 03/28/12  . Ischemic cardiomyopathy    S/P CABG x 3; EF 25%  . Mitral regurgitation    S/P mitral valve repair 2013  . Myocardial infarction   . Renal insufficiency     ROS:   All systems reviewed and negative except as noted in the HPI.   Past Surgical History:  Procedure Laterality Date  . COLONOSCOPY N/A 04/24/2014   RMR Pancolonic diverticulosis  . CORONARY ARTERY BYPASS GRAFT  10/17/2011   Procedure: CORONARY ARTERY BYPASS GRAFTING (CABG);  Surgeon: Purcell Nailslarence H Owen, MD;  [facility];  Service: Open Heart Surgery;  Laterality: N/A;  . ESOPHAGOGASTRODUODENOSCOPY N/A 04/24/2014   RMR Subtle nodularity the gastric mucosa of uncertain significance-status post biopsy. Hiatal hernia. chronic inflammation, no H.pylori  . ESOPHAGOGASTRODUODENOSCOPY N/A 06/24/2016   Procedure: ESOPHAGOGASTRODUODENOSCOPY (EGD);  Surgeon: Corbin Adeobert M Rourk,  MD;  [facility];  Service: Endoscopy;  Laterality: N/A;  . ICD  03/28/2012  . IMPLANTABLE CARDIOVERTER DEFIBRILLATOR IMPLANT N/A 03/28/2012   Procedure: IMPLANTABLE CARDIOVERTER DEFIBRILLATOR IMPLANT;  Surgeon: Marinus Maw, MD;  Location: [facility] CATH LAB;  Service: Cardiovascular;  Laterality: N/A;  . KNEE ARTHROSCOPY  ~ 2008   right  . LEFT AND RIGHT HEART CATHETERIZATION WITH CORONARY ANGIOGRAM N/A 10/12/2011   Procedure: LEFT AND RIGHT HEART CATHETERIZATION WITH CORONARY ANGIOGRAM;  Surgeon: Iran Ouch, MD;  Location: [facility] CATH LAB;  Service:  Cardiovascular;  Laterality: N/A;  . MITRAL VALVE REPAIR  10/17/2011   Procedure: MITRAL VALVE REPAIR (MVR);  Surgeon: Purcell Nails, MD;  Location: [facility] OR;  Service: Open Heart Surgery;  Laterality: N/A;  . NASAL SINUS SURGERY  1990's   right     Family History  Problem Relation Age of Onset  . Heart disease Mother   . Diabetes Father   . Cardiomyopathy Father   . Colon cancer Neg Hx      Social History   Social History  . Marital status: Married    Spouse name: N/A  . Number of children: N/A  . Years of education: N/A   Occupational History  . Retired    Social History Main Topics  . Smoking status: Never Smoker  . Smokeless tobacco: Never Used  . Alcohol use No  . Drug use: No  . Sexual activity: Yes   Other Topics Concern  . Not on file   Social History Narrative  . No narrative on file     BP 128/72   Pulse 73   Ht 6\' 5"  (1.956 m)   Wt 241 lb (109.3 kg)   SpO2 99%   BMI 28.58 kg/m   Physical Exam:  Well appearing 82 year old man, NAD HEENT: Unremarkable Neck:  6 cm JVD, no thyromegally Lungs:  Clear with no wheezes, rales, or rhonchi. HEART:  Regular rate rhythm, no murmurs, no rubs, no clicks Abd:  soft, positive bowel sounds, no organomegally, no rebound, no guarding Ext:  2 plus pulses, no edema, no cyanosis, no clubbing Skin:  No rashes no nodules Neuro:  CN II through XII intact, motor grossly intact  DEVICE  Normal device function.  See PaceArt for details. Underlying rhythm is now complete heart block.    Assess/Plan:  1. VT/VF - he has had no recurrent VT or VF. He will continue his current meds. No indication for AA drug therapy at this time. 2. AV block - he has a VDD ICD in place. His AV conduction has worsened over time and now he has complete heart block. He is pacing most of the time.  3. ICD - his Biotronik VDD device is working normally.  4. PAF - his systemic anticoagulation was appropriately stopped. We discussed the  Watchman procedure briefly but at this time I do not think that would be appropriate. 5. Preoperative eval - I discussed surgical risk with the patient. If removal of his gall bladder seems appropriate then he would be an acceptable surgical risk, but not no risk.  Leonia Reeves.D.

## 2016-07-11 NOTE — Patient Instructions (Signed)
Your physician wants you to follow-up in: 6 Months with Dr. Ladona Ridgel. You will receive a reminder letter in the mail two months in advance. If you don't receive a letter, please call our office to schedule the follow-up appointment.  Remote monitoring is used to monitor your Pacemaker of ICD from home. This monitoring reduces the number of office visits required to check your device to one time per year. It allows [place] to keep an eye on the functioning of your device to ensure it is working properly. You are scheduled for a device check from home on 10/10/16. You may send your transmission at any time that day. If you have a wireless device, the transmission will be sent automatically. After your physician reviews your transmission, you will receive a postcard with your next transmission date.  Your physician recommends that you continue on your current medications as directed. Please refer to the Current Medication list given to you today.  If you need a refill on your cardiac medications before your next appointment, please call your pharmacy.  Thank you for choosing [facility]!

## 2016-09-05 ENCOUNTER — Ambulatory Visit: Payer: Medicare HMO | Admitting: Nurse Practitioner

## 2016-09-14 ENCOUNTER — Ambulatory Visit: Payer: Medicare HMO | Admitting: Nurse Practitioner

## 2016-09-23 ENCOUNTER — Telehealth: Payer: Self-pay | Admitting: Internal Medicine

## 2016-09-23 NOTE — Telephone Encounter (Signed)
New Message   Per Verlee Monte with [facility] sent a request form for Eliquis for Tier 1 exception, and needs documentation filled out so patient is approved for this year. Resending Fax, and requesting call back.

## 2016-09-26 NOTE — Telephone Encounter (Signed)
Spoke with patient today and he states, "it's already done" He thinks someone here at [facility]. Office sent the Tier request. He states his co-pay was $2.00.

## 2016-10-03 ENCOUNTER — Encounter: Payer: Self-pay | Admitting: Nurse Practitioner

## 2016-10-03 ENCOUNTER — Ambulatory Visit: Payer: Medicare HMO | Admitting: Nurse Practitioner

## 2016-10-10 ENCOUNTER — Ambulatory Visit (INDEPENDENT_AMBULATORY_CARE_PROVIDER_SITE_OTHER): Payer: Medicare HMO | Admitting: *Deleted

## 2016-10-10 DIAGNOSIS — I959 Hypotension, unspecified: Secondary | ICD-10-CM | POA: Diagnosis not present

## 2016-10-11 ENCOUNTER — Encounter: Payer: Self-pay | Admitting: Cardiology

## 2016-10-11 LAB — CUP PACEART REMOTE DEVICE CHECK
Brady Statistic RV Percent Paced: 98 %
HIGH POWER IMPEDANCE MEASURED VALUE: 92 Ohm
Implantable Lead Implant Date: 20130731
Implantable Lead Location: 753860
Lead Channel Setting Pacing Amplitude: 1.6 V
Lead Channel Setting Pacing Pulse Width: 0.4 ms
MDC IDC LEAD MODEL: 365501
MDC IDC LEAD SERIAL: 10505793
MDC IDC MSMT BATTERY REMAINING PERCENTAGE: 73 %
MDC IDC MSMT LEADCHNL RA SENSING INTR AMPL: 3 mV
MDC IDC MSMT LEADCHNL RV IMPEDANCE VALUE: 592 Ohm
MDC IDC PG IMPLANT DT: 20130731
MDC IDC SESS DTM: 20180213134323
MDC IDC SET LEADCHNL RV SENSING SENSITIVITY: 0.8 mV
Pulse Gen Serial Number: 60687186

## 2016-10-11 NOTE — Progress Notes (Signed)
Remote ICD transmission.   

## 2016-10-19 ENCOUNTER — Telehealth: Payer: Self-pay | Admitting: Internal Medicine

## 2016-10-19 NOTE — Telephone Encounter (Signed)
Per pt phone call due to 3lb weight gain / tg

## 2016-10-19 NOTE — Telephone Encounter (Signed)
Returned pt call, he stated that he has gained 6 lbs. Since Sunday. ( weight started @ 237 and is now 243) He doesn't have any swelling nor SOB. He stated he has not changed his diet nor added any salt. He has been dosing himself with his lasix, as yesterday he took 180 mg. He stated that even with that much of lasix he is not urinating a lot. Please advise.

## 2016-10-20 ENCOUNTER — Ambulatory Visit: Payer: Medicare HMO | Admitting: Nurse Practitioner

## 2016-10-24 ENCOUNTER — Other Ambulatory Visit (HOSPITAL_COMMUNITY)
Admission: RE | Admit: 2016-10-24 | Discharge: 2016-10-24 | Disposition: A | Discharge status: Home / Self Care | Payer: Medicare HMO | Source: Ambulatory Visit | Attending: Adult Health | Admitting: Adult Health

## 2016-10-24 ENCOUNTER — Ambulatory Visit (INDEPENDENT_AMBULATORY_CARE_PROVIDER_SITE_OTHER): Payer: Medicare HMO | Admitting: Adult Health

## 2016-10-24 ENCOUNTER — Encounter: Payer: Self-pay | Admitting: Adult Health

## 2016-10-24 VITALS — BP 122/68 | HR 66 | Ht 77.0 in | Wt 244.0 lb

## 2016-10-24 DIAGNOSIS — D508 Other iron deficiency anemias: Secondary | ICD-10-CM | POA: Insufficient documentation

## 2016-10-24 DIAGNOSIS — Z79899 Other long term (current) drug therapy: Secondary | ICD-10-CM | POA: Insufficient documentation

## 2016-10-24 DIAGNOSIS — I482 Chronic atrial fibrillation, unspecified: Secondary | ICD-10-CM

## 2016-10-24 DIAGNOSIS — I255 Ischemic cardiomyopathy: Secondary | ICD-10-CM | POA: Diagnosis not present

## 2016-10-24 LAB — CBC WITH DIFFERENTIAL/PLATELET
BASOS PCT: 0 %
Basophils Absolute: 0 10*3/uL (ref 0.0–0.1)
Eosinophils Absolute: 0.2 10*3/uL (ref 0.0–0.7)
Eosinophils Relative: 2 %
HEMATOCRIT: 40.3 % (ref 39.0–52.0)
Hemoglobin: 12.9 g/dL — ABNORMAL LOW (ref 13.0–17.0)
Lymphocytes Relative: 26 %
Lymphs Abs: 2.4 10*3/uL (ref 0.7–4.0)
MCH: 25.8 pg — ABNORMAL LOW (ref 26.0–34.0)
MCHC: 32 g/dL (ref 30.0–36.0)
MCV: 80.6 fL (ref 78.0–100.0)
MONO ABS: 1 10*3/uL (ref 0.1–1.0)
MONOS PCT: 11 %
NEUTROS ABS: 5.4 10*3/uL (ref 1.7–7.7)
Neutrophils Relative %: 61 %
Platelets: 234 10*3/uL (ref 150–400)
RBC: 5 MIL/uL (ref 4.22–5.81)
RDW: 16 % — AB (ref 11.5–15.5)
WBC: 9 10*3/uL (ref 4.0–10.5)

## 2016-10-24 LAB — BASIC METABOLIC PANEL
Anion gap: 7 (ref 5–15)
BUN: 25 mg/dL — ABNORMAL HIGH (ref 6–20)
CALCIUM: 10.6 mg/dL — AB (ref 8.9–10.3)
CO2: 32 mmol/L (ref 22–32)
CREATININE: 2.21 mg/dL — AB (ref 0.61–1.24)
Chloride: 97 mmol/L — ABNORMAL LOW (ref 101–111)
GFR calc non Af Amer: 26 mL/min — ABNORMAL LOW (ref >60)
GFR, EST AFRICAN AMERICAN: 31 mL/min — AB (ref >60)
GLUCOSE: 134 mg/dL — AB (ref 65–99)
Potassium: 4 mmol/L (ref 3.5–5.1)
Sodium: 136 mmol/L (ref 135–145)

## 2016-10-24 NOTE — Progress Notes (Signed)
Cardiology Office Note   Date:  10/24/2016   ID:  Wayne Gordon, DOB 24-Mar-1934, MRN [id number]  PCP:  Samuel Jester, DO  Cardiologist: Vonda Antigua, NP   Chief Complaint  Patient presents with  . Cardiomyopathy      History of Present Illness: Wayne Gordon is a 83 y.o. male who presents for ongoing assessment and management of ischemic cardiomyopathy, chronic systolic heart failure, mild renal insufficiency, diabetes, hypertension, status post ICD in situ. The patient was last seen in the office by Nada Boozer, nurse practitioner, on 05/20/2016. At that time the patient was complaining of abdominal pain, and was found to be hypotensive. Labs were completed.   He was referred to GI. He has been seen by Dr. Ladona Ridgel in the office on follow-up for device check. Dr. Lubertha Basque note stated that he had and diagnosed with diverticulitis, diverticulosis. ELIQUIS was discontinued in the setting of GI bleeding associated with this. He also was found to have gallbladder disease. Dr. Ladona Ridgel noted that he would be an acceptable surgical risk to proceed with cholecystectomy if this was necessary.  The patient called our office on 10/19/2016 with complaints of 3 pound weight gain. He is being seen for evaluation on a focused office visit.He states that his weight is back to normal. He is now on Lasix 40 mg daily with an additional 20 mg as needed for weight gain. He states this morning his weight was back to normal. It does fluctuate any nose to take extra Lasix as needed. The patient continues to struggle with salt but does not add salt to his foods. He was re-started on ELIQUIS in December 2017 after being seen by GI in [place].  Past Medical History:  Diagnosis Date  . Allergy to ACE inhibitors    Angioedema many years ago; patient has tolerated Losartan (ARB) in the past - it was held during [facility] for GI bleed and worsening renal function >> resume Losartan 25 mg QD 06/2015  .  Arthritis   . Atrial fibrillation (HCC)    PAF, CHADs2Vasc = 5  . Carotid artery disease (HCC)    60-79% LICA  . CHF (congestive heart failure) (HCC)   . Coronary artery disease   . Diabetes mellitus   . GERD (gastroesophageal reflux disease)   . Hyperlipidemia   . Hypertension   . ICD (implantable cardiac defibrillator) in place 03/28/2012   [place]ron[place], Dr. Ladona Ridgel 03/28/12  . Ischemic cardiomyopathy    S/P CABG x 3; EF 25%  . Mitral regurgitation    S/P mitral valve repair 2013  . Myocardial infarction   . Renal insufficiency     Past Surgical History:  Procedure Laterality Date  . COLONOSCOPY N/A 04/24/2014   RMR Pancolonic diverticulosis  . CORONARY ARTERY BYPASS GRAFT  10/17/2011   Procedure: CORONARY ARTERY BYPASS GRAFTING (CABG);  Surgeon: Purcell Nails, MD;  Location: [facility] OR;  Service: Open Heart Surgery;  Laterality: N/A;  . ESOPHAGOGASTRODUODENOSCOPY N/A 04/24/2014   RMR Subtle nodularity the gastric mucosa of uncertain significance-status post biopsy. Hiatal hernia. chronic inflammation, no H.pylori  . ESOPHAGOGASTRODUODENOSCOPY N/A 06/24/2016   Procedure: ESOPHAGOGASTRODUODENOSCOPY (EGD);  Surgeon: Corbin Ade, MD;  Location: [facility] ENDO SUITE;  Service: Endoscopy;  Laterality: N/A;  . ICD  03/28/2012  . IMPLANTABLE CARDIOVERTER DEFIBRILLATOR IMPLANT N/A 03/28/2012   Procedure: IMPLANTABLE CARDIOVERTER DEFIBRILLATOR IMPLANT;  Surgeon: Marinus Maw, MD;  Location: [facility] CATH LAB;  Service: Cardiovascular;  Laterality: N/A;  . KNEE ARTHROSCOPY  ~  2008   right  . LEFT AND RIGHT HEART CATHETERIZATION WITH CORONARY ANGIOGRAM N/A 10/12/2011   Procedure: LEFT AND RIGHT HEART CATHETERIZATION WITH CORONARY ANGIOGRAM;  Surgeon: Iran Ouch, MD;  Location: [facility] CATH LAB;  Service: Cardiovascular;  Laterality: N/A;  . MITRAL VALVE REPAIR  10/17/2011   Procedure: MITRAL VALVE REPAIR (MVR);  Surgeon: Purcell Nails, MD;  Location: [facility] OR;  Service: Open Heart Surgery;  Laterality: N/A;   . NASAL SINUS SURGERY  1990's   right     Current Outpatient Prescriptions  Medication Sig Dispense Refill  . CALCIUM-MAGNESIUM-ZINC PO Take 2 tablets by mouth daily.    . carvedilol (COREG) 25 MG tablet Take 0.5 tablets (12.5 mg total) by mouth 2 (two) times daily with a meal. 135 tablet 6  . ELIQUIS 5 MG TABS tablet Take 5 mg by mouth 2 (two) times daily.     . furosemide (LASIX) 20 MG tablet Take 20 mg tablet by month (1 tablet) alternating with 40 mg (2 tablets) every other day. 90 tablet 3  . glipiZIDE (GLUCOTROL XL) 10 MG 24 hr tablet Take 10 mg by mouth 2 times daily at 12 noon and 4 pm. Pt takes it in the AM and PM    . Insulin Glargine (TOUJEO SOLOSTAR) 300 UNIT/ML SOPN Inject 12 Units as directed daily.     . isosorbide mononitrate (IMDUR) 30 MG 24 hr tablet Take 1 tablet (30 mg total) by mouth daily. 30 tablet 11  . losartan (COZAAR) 25 MG tablet Take 1 tablet (25 mg total) by mouth daily. 30 tablet 11  . Multiple Vitamins-Minerals (MULTIVITAMINS THER. W/MINERALS) TABS Take 1 tablet by mouth daily.    [name] Kitchen omeprazole (PRILOSEC) 20 MG capsule Take 20 mg by mouth daily.     . rosuvastatin (CRESTOR) 20 MG tablet Take 20 mg by mouth every evening.    . testosterone cypionate (DEPO-TESTOSTERONE) 200 MG/ML injection Inject 0.5 mg into the muscle every 21 ( twenty-one) days. INJECT 0.6ML- 0.8ML EVERY 2 WEEKS     No current facility-administered medications for this visit.     Allergies:   Ace inhibitors and Penicillins    Social History:  The patient  reports that he has never smoked. He has never used smokeless tobacco. He reports that he does not drink alcohol or use drugs.   Family History:  The patient's family history includes Cardiomyopathy in his father; Diabetes in his father; Heart disease in his mother.    ROS: All other systems are reviewed and negative. Unless otherwise mentioned in H&P    PHYSICAL EXAM: VS:  BP 122/68   Pulse 66   Ht 6\' 5"  (1.956 m)   Wt 244 lb  (110.7 kg)   SpO2 98%   BMI 28.93 kg/m  , BMI Body mass index is 28.93 kg/m. GEN: Well nourished, well developed, in no acute distress  HEENT: normal  Neck: no JVD, carotid bruits, or masses Cardiac: IRRR; no murmurs, rubs, or gallops,no edema  Respiratory:  clear to auscultation bilaterally, normal work of breathing GI: soft, nontender, nondistended, + BS MS: no deformity or atrophy  Skin: warm and dry, no rash Neuro:  Strength and sensation are intact Psych: euthymic mood, full affect   Recent Labs: 05/20/2016: B Natriuretic Peptide 384.0 06/23/2016: ALT 43 10/24/2016: BUN 25; Creatinine, Ser 2.21; Hemoglobin 12.9; Platelets 234; Potassium 4.0; Sodium 136    Lipid Panel    Component Value Date/Time   CHOL 156 07/11/2015 0353  TRIG 142 07/11/2015 0353   HDL 25 (L) 07/11/2015 0353   CHOLHDL 6.2 07/11/2015 0353   VLDL 28 07/11/2015 0353   LDLCALC 103 (H) 07/11/2015 0353      Wt Readings from Last 3 Encounters:  10/24/16 244 lb (110.7 kg)  07/11/16 241 lb (109.3 kg)  07/05/16 242 lb 12.8 oz (110.1 kg)      Other studies Reviewed: Echocardiogram 11\12\2016 Left ventricle: Global Longitudinal LV strain is -10.5% The   cavity size was mildly dilated. There was mild focal basal   hypertrophy of the septum. Systolic function was moderately   reduced. The estimated ejection fraction was in the range of 35%   to 40%. Diffuse hypokinesis. There is akinesis of the   basalinferior myocardium. There is akinesis of the   entireinferolateral myocardium. There is akinesis of the   basal-midlateral myocardium. - Aortic valve: Trileaflet; mildly thickened, mildly calcified   leaflets. There was trivial regurgitation. - Aorta: Aortic root dimension: 37 mm (ED). - Aortic root: The aortic root was trivially dilated. - Mitral valve: S/P MV repari with mitral valve ring. There was   mild regurgitation. Valve area by continuity equation (using LVOT   flow): 1.6 cm^2. - Tricuspid  valve: There was mild regurgitation. - Pulmonic valve: There was trivial regurgitation.  ASSESSMENT AND PLAN:  1.  Ischemic cardiomyopathy: He remains asymptomatic. He is very careful on weigh daily. He does take an additional 20 mg of Lasix that he gained 3 pounds. The patient is now on 40 mg daily and set up 20 mg one day alternating with 40 mg the next. The patient denies any edema, shortness of breath, or chest discomfort  he remains on carvedilol 12.5 mg twice a day and denies any racing heart rate or fatigue  2. Atrial fibrillation: Heart rate is currently well controlled. He has been started back on ELIQUIS by GI. I will check a CBC and a BMET for evaluation of kidney function and anemia. Patient has a history of chronic kidney disease and therefore close evaluation of function is warranted. It may need to adjust ELIQUIS dose.  3. Diabetes: Followed by primary care.  4. ICD in situ: Ongoing follow-up appointments with Dr. Ladona Ridgel, with remote checks per protocol.   Current medicines are reviewed at length with the patient today.    Labs/ tests ordered today include:   Orders Placed This Encounter  Procedures  . CBC with Differential  . Basic Metabolic Panel (BMET)     Disposition:   FU with 6 months  Signed, Joni Reining, NP  10/24/2016 3:06 PM    [facility] [address] [place], [place] [address] Phone: [phone]; Fax: [phone]

## 2016-10-24 NOTE — Progress Notes (Signed)
Name: Wayne Gordon    DOB: 06/27/1934  Age: 83 y.o.  MR#: [id number]       PCP:  Samuel Jester, DO      Insurance: Payor: [employer] MEDICARE / Plan: [employer] MEDICARE HMO / Product Type: *No Product type* /   CC:    Chief Complaint  Patient presents with  . Cardiomyopathy    VS Vitals:   10/24/16 1354  Pulse: 66  SpO2: 98%  Weight: 244 lb (110.7 kg)  Height: 6\' 5"  (1.956 m)    Weights Current Weight  10/24/16 244 lb (110.7 kg)  07/11/16 241 lb (109.3 kg)  07/05/16 242 lb 12.8 oz (110.1 kg)    Blood Pressure  BP Readings from Last 3 Encounters:  07/11/16 128/72  07/05/16 118/66  06/25/16 (!) 95/41     Admit date:  (Not on file) Last encounter with RMR:  Visit date not found   Allergy Ace inhibitors and Penicillins  Current Outpatient Prescriptions  Medication Sig Dispense Refill  . CALCIUM-MAGNESIUM-ZINC PO Take 2 tablets by mouth daily.    . carvedilol (COREG) 25 MG tablet Take 0.5 tablets (12.5 mg total) by mouth 2 (two) times daily with a meal. 135 tablet 6  . ELIQUIS 5 MG TABS tablet Take 5 mg by mouth 2 (two) times daily.     . furosemide (LASIX) 20 MG tablet Take 20 mg tablet by month (1 tablet) alternating with 40 mg (2 tablets) every other day. 90 tablet 3  . glipiZIDE (GLUCOTROL XL) 10 MG 24 hr tablet Take 10 mg by mouth 2 times daily at 12 noon and 4 pm. Pt takes it in the AM and PM    . Insulin Glargine (TOUJEO SOLOSTAR) 300 UNIT/ML SOPN Inject 12 Units as directed daily.     . isosorbide mononitrate (IMDUR) 30 MG 24 hr tablet Take 1 tablet (30 mg total) by mouth daily. 30 tablet 11  . losartan (COZAAR) 25 MG tablet Take 1 tablet (25 mg total) by mouth daily. 30 tablet 11  . Multiple Vitamins-Minerals (MULTIVITAMINS THER. W/MINERALS) TABS Take 1 tablet by mouth daily.    [name] Kitchen omeprazole (PRILOSEC) 20 MG capsule Take 20 mg by mouth daily.     . rosuvastatin (CRESTOR) 20 MG tablet Take 20 mg by mouth every evening.    . testosterone cypionate (DEPO-TESTOSTERONE) 200  MG/ML injection Inject 0.5 mg into the muscle every 21 ( twenty-one) days. INJECT 0.6ML- 0.8ML EVERY 2 WEEKS     No current facility-administered medications for this visit.     Discontinued Meds:   There are no discontinued medications.  Patient Active Problem List   Diagnosis Date Noted  . Gastrointestinal hemorrhage associated with intestinal diverticulosis 06/23/2016  . Carotid artery disease (HCC)   . Syncope and collapse 07/11/2015  . Ventricular arrhythmia 07/10/2015  . Complete heart block (HCC)   . ICD (implantable cardioverter-defibrillator) discharge   . Atrial fibrillation (HCC) 06/04/2015  . Diabetes (HCC) 06/02/2015  . 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/02/2015  . GERD (gastroesophageal reflux disease) 02/18/2015  . Abdominal pain, epigastric 02/18/2015  . Pleural effusion 09/01/2014  . Rectal bleeding 03/25/2014  . Acute blood loss anemia 03/25/2014  . Body mass index (BMI) of 28.0-28.9 in adult 09/30/2013  . Diabetes mellitus, type 2 (HCC) 08/09/2013  . PVC's (premature ventricular contractions) 07/04/2013  . HLD (hyperlipidemia) 01/03/2013  . Automatic implantable cardioverter-defibrillator in situ 03/30/2012  . S/P MVR (mitral valve repair) 01/16/2012  . HTN (hypertension) 11/28/2011  . Chronic  systolic heart failure (HCC) 10/31/2011  . Ischemic cardiomyopathy 10/31/2011  . CKD (chronic kidney disease) stage 3, GFR 30-59 ml/min 10/31/2011  . Current use of long term anticoagulation 10/31/2011  . Long term (current) use of anticoagulants 10/28/2011  . S/P mitral valve repair 10/17/2011  . S/P CABG x 3 10/17/2011  . Mitral regurgitation 10/12/2011  . NSTEMI (non-ST elevated myocardial infarction) (HCC) 10/11/2011    LABS    Component Value Date/Time   NA 135 06/23/2016 1240   NA 136 05/20/2016 1623   NA 142 07/11/2015 0353   K 4.4 06/23/2016 1240   K 4.1 05/20/2016 1623   K 3.6 07/11/2015 0353   CL 103 06/23/2016 1240   CL 101 05/20/2016 1623   CL  103 07/11/2015 0353   CO2 28 06/23/2016 1240   CO2 27 05/20/2016 1623   CO2 32 07/11/2015 0353   GLUCOSE 184 (H) 06/23/2016 1240   GLUCOSE 110 (H) 05/20/2016 1623   GLUCOSE 126 (H) 07/11/2015 0353   BUN 40 (H) 06/23/2016 1240   BUN 26 (H) 05/20/2016 1623   BUN 19 07/11/2015 0353   CREATININE 1.79 (H) 06/23/2016 1240   CREATININE 1.64 (H) 05/20/2016 1623   CREATININE 1.56 (H) 07/11/2015 0353   CREATININE 2.06 (H) 03/24/2014 1237   CALCIUM 8.6 (L) 06/23/2016 1240   CALCIUM 8.7 (L) 05/20/2016 1623   CALCIUM 8.9 07/11/2015 0353   GFRNONAA 34 (L) 06/23/2016 1240   GFRNONAA 38 (L) 05/20/2016 1623   GFRNONAA 41 (L) 07/11/2015 0353   GFRAA 40 (L) 06/23/2016 1240   GFRAA 44 (L) 05/20/2016 1623   GFRAA 47 (L) 07/11/2015 0353   CMP     Component Value Date/Time   NA 135 06/23/2016 1240   K 4.4 06/23/2016 1240   CL 103 06/23/2016 1240   CO2 28 06/23/2016 1240   GLUCOSE 184 (H) 06/23/2016 1240   BUN 40 (H) 06/23/2016 1240   CREATININE 1.79 (H) 06/23/2016 1240   CREATININE 2.06 (H) 03/24/2014 1237   CALCIUM 8.6 (L) 06/23/2016 1240   PROT 5.6 (L) 06/23/2016 1240   ALBUMIN 3.1 (L) 06/23/2016 1240   AST 19 06/23/2016 1240   ALT 43 06/23/2016 1240   ALKPHOS 78 06/23/2016 1240   BILITOT 0.8 06/23/2016 1240   GFRNONAA 34 (L) 06/23/2016 1240   GFRAA 40 (L) 06/23/2016 1240       Component Value Date/Time   WBC 6.6 07/08/2016 1139   WBC 7.1 06/24/2016 1413   WBC 6.3 06/24/2016 0809   WBC 7.5 06/24/2016 0055   HGB 8.3 (L) 06/24/2016 1413   HGB 8.3 (L) 06/24/2016 0809   HGB 7.7 (L) 06/24/2016 0055   HCT 30.2 (L) 07/08/2016 1139   HCT 24.4 (L) 06/24/2016 1413   HCT 25.6 (L) 06/24/2016 0809   HCT 22.4 (L) 06/24/2016 0055   HCT 36.8 (L) 06/21/2016 1413   MCV 94 07/08/2016 1139   MCV 93.8 06/24/2016 1413   MCV 96.6 06/24/2016 0809   MCV 92.6 06/24/2016 0055   MCV 93 06/21/2016 1413    Lipid Panel     Component Value Date/Time   CHOL 156 07/11/2015 0353   TRIG 142 07/11/2015  0353   HDL 25 (L) 07/11/2015 0353   CHOLHDL 6.2 07/11/2015 0353   VLDL 28 07/11/2015 0353   LDLCALC 103 (H) 07/11/2015 0353    ABG    Component Value Date/Time   PHART 7.307 (L) 10/17/2011 2219   PCO2ART 39.4 10/17/2011 2219   PO2ART 91.0  10/17/2011 2219   HCO3 19.8 (L) 10/17/2011 2219   TCO2 21 10/18/2011 1638   ACIDBASEDEF 6.0 (H) 10/17/2011 2219   O2SAT 96.0 10/17/2011 2219     No results found for: TSH BNP (last 3 results)  Recent Labs  05/20/16 1624  BNP 384.0*    ProBNP (last 3 results) No results for input(s): PROBNP in the last 8760 hours.  Cardiac Panel (last 3 results) No results for input(s): CKTOTAL, CKMB, TROPONINI, RELINDX in the last 72 hours.  Iron/TIBC/Ferritin/ %Sat No results found for: IRON, TIBC, FERRITIN, IRONPCTSAT   EKG Orders placed or performed during the hospital encounter of 06/23/16  . EKG 12-Lead  . EKG 12-Lead  . EKG     Prior Assessment and Plan Problem List as of 10/24/2016 Reviewed: 07/11/2016  8:47 AM by Lewayne Bunting, MD     Cardiovascular and Mediastinum   NSTEMI (non-ST elevated myocardial infarction) [facility])   Last Assessment & Plan 09/22/2014 Office Visit Written 09/22/2014 12:18 PM by Marinus Maw, MD    He denies anginal symptoms. Will follow.      Mitral regurgitation   Chronic systolic heart failure [facility])   Last Assessment & Plan 06/04/2015 Office Visit Written 06/04/2015  9:29 AM by Marinus Maw, MD    His symptoms remain class 2A. He will continue his current meds.       Ischemic cardiomyopathy   Last Assessment & Plan 06/04/2015 Office Visit Written 06/04/2015  9:30 AM by Marinus Maw, MD    He denies anginal symptoms. Will follow.       HTN (hypertension)   Last Assessment & Plan 06/04/2015 Office Visit Written 06/04/2015  9:30 AM by Marinus Maw, MD    His blood pressure is slightly elevated today. He will continue his current meds and maintain a low sodium diet.       PVC's (premature ventricular  contractions)   Last Assessment & Plan 03/24/2014 Office Visit Written 03/24/2014 12:05 PM by Dyann Kief, PA-C    Patient has frequent PVCs and bigeminy. This is followed closely by Dr. Ladona Ridgel. Followup with Dr. Ladona Ridgel in 2 months.      Atrial fibrillation [facility])   Last Assessment & Plan 06/04/2015 Office Visit Written 06/04/2015  9:34 AM by Marinus Maw, MD    This is a new problem. He has had up to 21 min. Of atrial fib on ICD interogation. His CHADSVASC score is 6. Will start Eliquis.       Ventricular arrhythmia   Complete heart block (HCC)   Syncope and collapse   Carotid artery disease [facility])     Respiratory   Pleural effusion     Digestive   Rectal bleeding   Last Assessment & Plan 07/08/2014 Office Visit Written 07/08/2014 11:01 AM by Tiffany Kocher, PA-C    Previous moderate volume painless hematochezia requiring hospitalization likely diverticular bleed. No further problems. Recent EGD and colonoscopy as outlined above. Current hemoglobin normal.  Discussed with patient the importance to have a CBC drawn about every 4 months or so to evaluate for recurrent anemia. He would like to have this done with his endocrinologist, instructions provided on AVS. He will call if he has any recurrent bleeding or drop in hemoglobin.  At this time does not appear that he needs to have small bowel evaluated. FYI, he has a defibrillator. If he has a drop in his hemoglobin or unexplained bleeding week consider evaluation of small bowel in the  future possibly CTE.      GERD (gastroesophageal reflux disease)   Gastrointestinal hemorrhage associated with intestinal diverticulosis   Last Assessment & Plan 07/05/2016 Office Visit Written 07/05/2016 10:29 AM by Anice Paganini, NP    No further bleeding since discharge. Hgb was trending up at that time. Has an appt on 07/11/16 with cardiology to consider restarting Eliquis and with [facility] for consideration of cholecystectomy. Will have CBC drawn 2 week  post-d/c, before those appointments, for follow-up. Return for follow-up 2 months. Call urgently if any further bleeding.        Endocrine   Diabetes (HCC)   Gonadotropin deficiency (HCC)   Diabetes mellitus, type 2 (HCC)     Genitourinary   CKD (chronic kidney disease) stage 3, GFR 30-59 ml/min   Last Assessment & Plan 03/24/2014 Office Visit Written 03/24/2014 12:06 PM by Dyann Kief, PA-C    Patient's creatinine up to 2.4 in the hospital. Most recent was 1.91 on 03/10/14. We will recheck today.        Other   S/P mitral valve repair   S/P CABG x 3   Last Assessment & Plan 04/19/2013 Office Visit Written 04/19/2013  2:06 PM by Jodelle Gross, NP    No cardiac complaints at this time of chest pain, DOE, or dizziness. He remains active and will go and play golf this weekend. Continue risk management.      Long term (current) use of anticoagulants   Last Assessment & Plan 03/08/2012 Office Visit Written 03/08/2012  1:39 PM by Rande Brunt, PA    Coumadin will be discontinued, given that patient remains in NSR, verified by EKG. As noted, will increase ASA to 325 daily in the interim for one full year, then back to 81 mg daily, indefinitely.      Current use of long term anticoagulation   Last Assessment & Plan 10/31/2011 Office Visit Written 10/31/2011  3:41 PM by Rande Brunt, PA    Followup in our Coumadin clinic, as scheduled. Recommendation as to duration of Coumadin anticoagulation, to be deferred to Dr. Tressie Stalker.      S/P MVR (mitral valve repair)   Automatic implantable cardioverter-defibrillator in situ   Last Assessment & Plan 09/22/2014 Office Visit Written 09/22/2014 12:21 PM by Marinus Maw, MD    His device is working normally. He is pacing a bit more than I would like. Will follow.      HLD (hyperlipidemia)   Last Assessment & Plan 01/03/2013 Office Visit Written 01/03/2013  1:58 PM by Rande Brunt, PA-C    On statin, followed by his endocrinologist.  Recommended aggressive management with target LDL 70 or less, if feasible.      Acute blood loss anemia   Last Assessment & Plan 07/05/2016 Office Visit Written 07/05/2016 10:30 AM by Anice Paganini, NP    Acute blood loss anemia related to diverticular bleed. Lowest hemoglobin 7.7, rose to 8.3 on discharge. I will check a CBC 07/08/2016 which will be 2 weeks post discharge but also before he sees cardiology to consider restarting Eliquis and surgery for possible cholecystectomy. Return for follow-up in 2 months.      Abdominal pain, epigastric   Last Assessment & Plan 07/05/2016 Office Visit Written 07/05/2016 10:31 AM by Anice Paganini, NP    Persistent abdominal pain, abdominal ultrasound found multiple gallstones and high level normal although wall thickness. He has an appointment on 07/11/2016 with [facility] surgery  for consideration of lap cholecystectomy.      Body mass index (BMI) of 28.0-28.9 in adult   ICD (implantable cardioverter-defibrillator) discharge       Imaging: No results found.

## 2016-10-24 NOTE — Patient Instructions (Signed)
Your physician recommends that you schedule a follow-up appointment in: 3 Months with Dr. Ladona Ridgel  Your physician recommends that you continue on your current medications as directed. Please refer to the Current Medication list given to you today.  Your physician recommends that you have lab work done today.  If you need a refill on your cardiac medications before your next appointment, please call your pharmacy.  Thank you for choosing [facility]!

## 2016-10-25 ENCOUNTER — Encounter: Payer: Self-pay | Admitting: Cardiology

## 2016-10-25 ENCOUNTER — Telehealth: Payer: Self-pay | Admitting: *Deleted

## 2016-10-25 DIAGNOSIS — Z79899 Other long term (current) drug therapy: Secondary | ICD-10-CM

## 2016-10-25 NOTE — Telephone Encounter (Signed)
If not better he will need to be seen in the office or go to the ED. GT

## 2016-10-25 NOTE — Telephone Encounter (Signed)
Patient instructed of how to take Lasix. Orders for BMET placed. Pt states that he just received 90 day supply of Eliquis 5 mg  from [facility] at a cost to him of $1300. Joni Reining, NP states that pt may stay on  Eliquis 5 mg until results from Bmet. Patient voiced understanding. Patient will also fill out forms for Eliquis assistance program.

## 2016-10-25 NOTE — Telephone Encounter (Signed)
-----   Message from Jodelle Gross, NP sent at 10/24/2016  3:26 PM EST ----- Patient has been taking lasix 40 mg BID instead of 20 mg one day and 40 mg the next. His labs show that his kidney function is worsening. He should cut back on the lasix to 20 mg daily./40 mg daily alternating. He also should cut down on the Eliquis dose to 2.5 mg BID. Repeat this BMET in one week with new medication regimen.

## 2016-10-26 ENCOUNTER — Telehealth: Payer: Self-pay | Admitting: *Deleted

## 2016-10-26 MED ORDER — APIXABAN 2.5 MG PO TABS: 2.5000 mg | ORAL_TABLET | Freq: Two times a day (BID) | ORAL | 3 refills | Status: DC

## 2016-10-26 NOTE — Telephone Encounter (Signed)
Patient approved for tier exception for Eliquis 2.5 mg. Will be covered at a Tier 1 copay. Authorization is good until 08/28/17. Patient notified and voiced understanding.

## 2016-10-31 ENCOUNTER — Encounter: Payer: Self-pay | Admitting: Nurse Practitioner

## 2016-10-31 ENCOUNTER — Ambulatory Visit (INDEPENDENT_AMBULATORY_CARE_PROVIDER_SITE_OTHER): Payer: Medicare HMO | Admitting: Nurse Practitioner

## 2016-10-31 ENCOUNTER — Other Ambulatory Visit (HOSPITAL_COMMUNITY)
Admission: RE | Admit: 2016-10-31 | Discharge: 2016-10-31 | Disposition: A | Discharge status: Home / Self Care | Payer: Medicare HMO | Source: Ambulatory Visit | Attending: Adult Health | Admitting: Adult Health

## 2016-10-31 VITALS — BP 105/58 | HR 65 | Temp 98.3°F | Ht 77.0 in | Wt 246.8 lb

## 2016-10-31 DIAGNOSIS — D62 Acute posthemorrhagic anemia: Secondary | ICD-10-CM | POA: Diagnosis not present

## 2016-10-31 DIAGNOSIS — I509 Heart failure, unspecified: Secondary | ICD-10-CM | POA: Diagnosis present

## 2016-10-31 DIAGNOSIS — R1013 Epigastric pain: Secondary | ICD-10-CM

## 2016-10-31 DIAGNOSIS — K5791 Diverticulosis of intestine, part unspecified, without perforation or abscess with bleeding: Secondary | ICD-10-CM | POA: Diagnosis not present

## 2016-10-31 LAB — BASIC METABOLIC PANEL
ANION GAP: 6 (ref 5–15)
BUN: 23 mg/dL — AB (ref 6–20)
CALCIUM: 9 mg/dL (ref 8.9–10.3)
CO2: 29 mmol/L (ref 22–32)
CREATININE: 1.99 mg/dL — AB (ref 0.61–1.24)
Chloride: 100 mmol/L — ABNORMAL LOW (ref 101–111)
GFR calc Af Amer: 35 mL/min — ABNORMAL LOW (ref >60)
GFR, EST NON AFRICAN AMERICAN: 30 mL/min — AB (ref >60)
GLUCOSE: 126 mg/dL — AB (ref 65–99)
Potassium: 4.4 mmol/L (ref 3.5–5.1)
Sodium: 135 mmol/L (ref 135–145)

## 2016-10-31 NOTE — Patient Instructions (Signed)
Called [employer] for PA for HIDA. No PA needed, ref# [id number].

## 2016-10-31 NOTE — Assessment & Plan Note (Signed)
Some continued epigastric and right upper quadrant pain. Evidence of significant gallstones and upper level wall thickness on ultrasound. Was seen by surgery who declined surgical intervention and instead changed his PPI to pantoprazole. Some occasional recurrence of pain. At this time I will order a HIDA scan to further evaluate gallbladder for possible etiology of pain. Return for follow-up in 3 months.

## 2016-10-31 NOTE — Assessment & Plan Note (Signed)
Noted significant anemia which is improved drastically at his last CBC completed to 20 02/15/2017 with a noted hemoglobin of 12.6. Only 1 episode of rectal bleeding a couple weeks ago which is described as relatively minimal. Recommend he continue to monitor for recurrent bleed, continue current medications. He is due to have's yearly labs drawn in about 2 months and we will request CBC results to be sent to our office. He is to call for any recurrent bleeding or worsening anemia symptoms at which point we will likely order a CBC for his routine labs. Return for follow-up in 3 months otherwise.

## 2016-10-31 NOTE — Assessment & Plan Note (Signed)
GI bleed deemed likely diverticular in nature. Significant improvement in hemoglobin as noted below. Continue to monitor, return for follow-up in 3 months.

## 2016-10-31 NOTE — Progress Notes (Signed)
cc'ed to pcp °

## 2016-10-31 NOTE — Progress Notes (Signed)
Referring Provider: Samuel Jester, DO Primary Care Physician:  Samuel Jester, DO Primary GI:  Dr. Jena Gauss  Chief Complaint  Patient presents with  . Anemia    HPI:   Wayne Gordon is a 83 y.o. male who presents for follow-up on GIB and anemia. He was last seen in our office 07/05/17 for diverticular bleed, anemia, and epigastric abdominal pain. At that time he was doing well overall s/p discharge from hospitalization for GI bleed in the setting of Eliquis use. EGD unremarkable during hospitalization with deemed likely self-limiting diverticular bleed and hgb as low as 7.7. Referral for lap chole with [facility] for noted numberous gallstone and sludge, upper-level limit wall thickness, epigastric pain.   At his last visit he was doing well overall, no further bleeding noted. Occasional self-limiting abdominal pain, no back on Eliquis at that time pending cardiology appointment (which was upcoming). No other GI complaints.   CBC recently completed (10/24/16) which was significantly improved at 12.6.  Today he states he's doing ok overall. He recently had an episode of epigastric pain. Saw [facility] who did not see a need to take out the gallbladder. Has has some rectal bleeding a couple weeks ago, described as minimal to moderate. No anemia symptoms after this. Otherwise no further hematochezia or melena. Papaya enzyme helps epigastric pain. Surgery changes him from omeprazole to pantoprazole. Denies N/V, fever, chills. Denies chest pain, dyspnea, dizziness, lightheadedness, syncope, near syncope. Denies any other upper or lower GI symptoms.  Past Medical History:  Diagnosis Date  . Allergy to ACE inhibitors    Angioedema many years ago; patient has tolerated Losartan (ARB) in the past - it was held during [facility] for GI bleed and worsening renal function >> resume Losartan 25 mg QD 06/2015  . Arthritis   . Atrial fibrillation (HCC)    PAF, CHADs2Vasc = 5  .  Carotid artery disease (HCC)    60-79% LICA  . CHF (congestive heart failure) (HCC)   . Coronary artery disease   . Diabetes mellitus   . GERD (gastroesophageal reflux disease)   . Hyperlipidemia   . Hypertension   . ICD (implantable cardiac defibrillator) in place 03/28/2012   Biotronik, Dr. Ladona Ridgel 03/28/12  . Ischemic cardiomyopathy    S/P CABG x 3; EF 25%  . Mitral regurgitation    S/P mitral valve repair 2013  . Myocardial infarction   . Renal insufficiency     Past Surgical History:  Procedure Laterality Date  . COLONOSCOPY N/A 04/24/2014   RMR Pancolonic diverticulosis  . CORONARY ARTERY BYPASS GRAFT  10/17/2011   Procedure: CORONARY ARTERY BYPASS GRAFTING (CABG);  Surgeon: Purcell Nails, MD;  Location: [facility] OR;  Service: Open Heart Surgery;  Laterality: N/A;  . ESOPHAGOGASTRODUODENOSCOPY N/A 04/24/2014   RMR Subtle nodularity the gastric mucosa of uncertain significance-status post biopsy. Hiatal hernia. chronic inflammation, no H.pylori  . ESOPHAGOGASTRODUODENOSCOPY N/A 06/24/2016   Procedure: ESOPHAGOGASTRODUODENOSCOPY (EGD);  Surgeon: Corbin Ade, MD;  [facility];  Service: Endoscopy;  Laterality: N/A;  . ICD  03/28/2012  . IMPLANTABLE CARDIOVERTER DEFIBRILLATOR IMPLANT N/A 03/28/2012   Procedure: IMPLANTABLE CARDIOVERTER DEFIBRILLATOR IMPLANT;  Surgeon: Marinus Maw, MD;  Location: [facility] CATH LAB;  Service: Cardiovascular;  Laterality: N/A;  . KNEE ARTHROSCOPY  ~ 2008   right  . LEFT AND RIGHT HEART CATHETERIZATION WITH CORONARY ANGIOGRAM N/A 10/12/2011   Procedure: LEFT AND RIGHT HEART CATHETERIZATION WITH CORONARY ANGIOGRAM;  Surgeon: Iran Ouch,  MD;  Location: [facility] CATH LAB;  Service: Cardiovascular;  Laterality: N/A;  . MITRAL VALVE REPAIR  10/17/2011   Procedure: MITRAL VALVE REPAIR (MVR);  Surgeon: Purcell Nails, MD;  Location: [facility] OR;  Service: Open Heart Surgery;  Laterality: N/A;  . NASAL SINUS SURGERY  1990's   right    Current Outpatient  Prescriptions  Medication Sig Dispense Refill  . apixaban (ELIQUIS) 2.5 MG TABS tablet Take 1 tablet (2.5 mg total) by mouth 2 (two) times daily. 180 tablet 3  . CALCIUM-MAGNESIUM-ZINC PO Take 2 tablets by mouth daily.    . carvedilol (COREG) 25 MG tablet Take 0.5 tablets (12.5 mg total) by mouth 2 (two) times daily with a meal. 135 tablet 6  . furosemide (LASIX) 20 MG tablet Take 20 mg tablet by month (1 tablet) alternating with 40 mg (2 tablets) every other day. 90 tablet 3  . glipiZIDE (GLUCOTROL XL) 10 MG 24 hr tablet Take 10 mg by mouth 2 times daily at 12 noon and 4 pm. Pt takes it in the AM and PM    . Insulin Glargine (TOUJEO SOLOSTAR) 300 UNIT/ML SOPN Inject 12 Units as directed daily.     . isosorbide mononitrate (IMDUR) 30 MG 24 hr tablet Take 1 tablet (30 mg total) by mouth daily. 30 tablet 11  . losartan (COZAAR) 25 MG tablet Take 1 tablet (25 mg total) by mouth daily. 30 tablet 11  . Multiple Vitamins-Minerals (MULTIVITAMINS THER. W/MINERALS) TABS Take 1 tablet by mouth daily.    [name] Kitchen omeprazole (PRILOSEC) 20 MG capsule Take 20 mg by mouth daily.     . rosuvastatin (CRESTOR) 20 MG tablet Take 20 mg by mouth every evening.    . testosterone cypionate (DEPO-TESTOSTERONE) 200 MG/ML injection Inject 0.5 mg into the muscle every 21 ( twenty-one) days. INJECT 0.6ML- 0.8ML EVERY 2 WEEKS     No current facility-administered medications for this visit.     Allergies as of 10/31/2016 - Review Complete 10/31/2016  Allergen Reaction Noted  . Ace inhibitors Swelling and Other (See Comments) 02/03/2014  . Penicillins Palpitations 10/10/2011    Family History  Problem Relation Age of Onset  . Heart disease Mother   . Diabetes Father   . Cardiomyopathy Father   . Colon cancer Neg Hx     Social History   Social History  . Marital status: Married    Spouse name: N/A  . Number of children: N/A  . Years of education: N/A   Occupational History  . Retired    Social History Main  Topics  . Smoking status: Never Smoker  . Smokeless tobacco: Never Used  . Alcohol use No  . Drug use: No  . Sexual activity: Yes   Other Topics Concern  . None   Social History Narrative  . None    Review of Systems: General: Negative for anorexia, weight loss, fever, chills, fatigue, weakness.  ENT: Negative for hoarseness, difficulty swallowing. CV: Negative for chest pain, angina, palpitations, peripheral edema.  Respiratory: Negative for dyspnea at rest, cough, sputum, wheezing.  GI: See history of present illness. MS: Admits chronic back pain.  Endo: Negative for unusual weight change.  Heme: Negative for bruising or bleeding.   Physical Exam: BP (!) 105/58   Pulse 65   Temp 98.3 F (36.8 C) (Oral)   Ht 6\' 5"  (1.956 m)   Wt 246 lb 12.8 oz (111.9 kg)   BMI 29.27 kg/m  General:   Alert and oriented.  Pleasant and cooperative. Well-nourished and well-developed.  Eyes:  Without icterus, sclera clear and conjunctiva pink.  Ears:  Normal auditory acuity. Cardiovascular:  S1, S2 present without murmurs appreciated. Extremities without clubbing or edema. Respiratory:  Clear to auscultation bilaterally. No wheezes, rales, or rhonchi. No distress.  Gastrointestinal:  +BS, soft, non-tender and non-distended. No HSM noted. No guarding or rebound. No masses appreciated.  Rectal:  Deferred  Musculoskalatal:  Symmetrical without gross deformities. Neurologic:  Alert and oriented x4;  grossly normal neurologically. Psych:  Alert and cooperative. Normal mood and affect. Heme/Lymph/Immune: No excessive bruising noted.    10/31/2016 12:05 PM   Disclaimer: This note was dictated with voice recognition software. Similar sounding words can inadvertently be transcribed and may not be corrected upon review.

## 2016-10-31 NOTE — Patient Instructions (Signed)
1. We will schedule your test for your gallbladder for you. 2. Continue taking your current medications. 3. Have a CBC drawn at her next labs with Dr. Chase Picket. 4. Call if any worsening symptoms or recurrent bleeding at which point we will likely want to check your labs sooner. 5. Return for follow-up in 3 months.

## 2016-11-01 ENCOUNTER — Telehealth: Payer: Self-pay

## 2016-11-01 MED ORDER — APIXABAN 5 MG PO TABS: 5.0000 mg | ORAL_TABLET | Freq: Two times a day (BID) | ORAL | 3 refills | Status: DC

## 2016-11-01 NOTE — Telephone Encounter (Signed)
-----   Message from Jodelle Gross, NP sent at 10/31/2016  4:48 PM EST ----- Kidney function is improved with no need to change dose of Eliquis. Continue 5 mg BID.

## 2016-11-01 NOTE — Telephone Encounter (Addendum)
LM at home number for pt to go back to 5 mg BID     1224 hrs: Pt called back, he states [employer] will let him return hie Eliquis 2.5 mg tablets,I e-scribed 5 mg to them

## 2016-11-01 NOTE — Addendum Note (Signed)
Addended by: Marlyn Corporal A on: 11/01/2016 12:25 PM   Modules accepted: Orders

## 2016-11-01 NOTE — Telephone Encounter (Signed)
He can take two of them BID. Asked her not to change dose until after BMET came back.

## 2016-11-01 NOTE — Telephone Encounter (Signed)
Patient was already changed to Eliquis 2.5 mg and K [employer] LPN had to deal with insurance  to give him a tier exception due to high co-pay cost. Patient just received a 90 day supply    Please advise

## 2016-11-07 ENCOUNTER — Encounter (HOSPITAL_COMMUNITY): Payer: Medicare HMO

## 2016-12-27 ENCOUNTER — Telehealth: Payer: Self-pay | Admitting: Internal Medicine

## 2016-12-27 DIAGNOSIS — D649 Anemia, unspecified: Secondary | ICD-10-CM

## 2016-12-27 NOTE — Telephone Encounter (Signed)
Patient would like to speak with nurse regarding BP concerns / tg

## 2016-12-28 NOTE — Telephone Encounter (Signed)
Please have him decrease isosorbide to 15 mg daily. If BP remains low, he can stop it altogether after three days. He needs to have CBC to evaluate for anemia causing low BP.

## 2016-12-28 NOTE — Telephone Encounter (Signed)
Pt states that he has been taking his blood pressure for some weeks now and has noticed it  Is staying in the low range. [phone]) He has been feeling off balance and lightheaded and he stated he spoke wit his pharmacist about it and she stated he may need medication adjustments. Please advise.

## 2016-12-28 NOTE — Telephone Encounter (Signed)
Pt made aware, stated he will have labs done tomorrow.

## 2016-12-29 ENCOUNTER — Other Ambulatory Visit (HOSPITAL_COMMUNITY)
Admission: RE | Admit: 2016-12-29 | Discharge: 2016-12-29 | Disposition: A | Discharge status: Home / Self Care | Payer: Medicare HMO | Source: Ambulatory Visit | Attending: Adult Health | Admitting: Adult Health

## 2016-12-29 DIAGNOSIS — D649 Anemia, unspecified: Secondary | ICD-10-CM | POA: Diagnosis present

## 2016-12-29 LAB — CBC WITH DIFFERENTIAL/PLATELET
BASOS ABS: 0 10*3/uL (ref 0.0–0.1)
Basophils Relative: 0 %
Eosinophils Absolute: 0.1 10*3/uL (ref 0.0–0.7)
Eosinophils Relative: 2 %
HEMATOCRIT: 33.4 % — AB (ref 39.0–52.0)
Hemoglobin: 10.4 g/dL — ABNORMAL LOW (ref 13.0–17.0)
LYMPHS PCT: 27 %
Lymphs Abs: 1.9 10*3/uL (ref 0.7–4.0)
MCH: 24 pg — ABNORMAL LOW (ref 26.0–34.0)
MCHC: 31.1 g/dL (ref 30.0–36.0)
MCV: 77 fL — AB (ref 78.0–100.0)
MONO ABS: 0.7 10*3/uL (ref 0.1–1.0)
MONOS PCT: 10 %
NEUTROS ABS: 4.3 10*3/uL (ref 1.7–7.7)
Neutrophils Relative %: 61 %
Platelets: 265 10*3/uL (ref 150–400)
RBC: 4.34 MIL/uL (ref 4.22–5.81)
RDW: 16.9 % — AB (ref 11.5–15.5)
WBC: 7 10*3/uL (ref 4.0–10.5)

## 2016-12-30 ENCOUNTER — Telehealth: Payer: Self-pay

## 2016-12-30 MED ORDER — CARVEDILOL 6.25 MG PO TABS: 6.2500 mg | ORAL_TABLET | Freq: Two times a day (BID) | ORAL | 3 refills | Status: DC

## 2016-12-30 MED ORDER — ISOSORBIDE MONONITRATE ER 30 MG PO TB24: 15.0000 mg | ORAL_TABLET | Freq: Every day | ORAL | 3 refills | Status: DC

## 2016-12-30 NOTE — Telephone Encounter (Signed)
Fax sent back to [facility] With Dr. Lubertha Basque recommendations for patient to decrease coreg to 6.25 BID, and decrease imdur to 15 mg QD (agreeing with Joni Reining, NP recs on 12/28/16). Orders changed and sent to

## 2017-01-09 ENCOUNTER — Other Ambulatory Visit (HOSPITAL_COMMUNITY): Payer: Self-pay | Admitting: *Deleted

## 2017-01-09 DIAGNOSIS — R1011 Right upper quadrant pain: Secondary | ICD-10-CM

## 2017-01-10 ENCOUNTER — Ambulatory Visit (INDEPENDENT_AMBULATORY_CARE_PROVIDER_SITE_OTHER): Payer: Medicare HMO | Admitting: Nurse Practitioner

## 2017-01-10 ENCOUNTER — Encounter: Payer: Self-pay | Admitting: Nurse Practitioner

## 2017-01-10 VITALS — BP 109/64 | HR 65 | Temp 97.3°F | Ht 77.0 in | Wt 241.8 lb

## 2017-01-10 DIAGNOSIS — R110 Nausea: Secondary | ICD-10-CM | POA: Insufficient documentation

## 2017-01-10 DIAGNOSIS — K219 Gastro-esophageal reflux disease without esophagitis: Secondary | ICD-10-CM | POA: Diagnosis not present

## 2017-01-10 DIAGNOSIS — D62 Acute posthemorrhagic anemia: Secondary | ICD-10-CM | POA: Diagnosis not present

## 2017-01-10 MED ORDER — ONDANSETRON HCL 4 MG PO TABS: 4.0000 mg | ORAL_TABLET | Freq: Three times a day (TID) | ORAL | 1 refills | Status: DC | PRN

## 2017-01-10 NOTE — Progress Notes (Signed)
Referring Provider: Samuel Jester, DO Primary Care Physician:  Samuel Jester, DO Primary GI:  Dr. Jena Gauss  Chief Complaint  Patient presents with  . Abdominal Pain    ruq, abdomen feels full  . Anemia  . Nausea  . no appetite    HPI:   Wayne Gordon is a 83 y.o. male who presents for follow-up on epigastric pain, GI bleed, anemia. Was initially seen after hospital discharge for diverticular bleed in the setting of Eliquis. EGD unremarkable during hospitalization, hemoglobin as low as 7.7. Last CBC at the time of his last visit had been completed 10/24/2016 with significant improvement in hemoglobin to 12.6. He was doing well overall at his last visit, saw [facility] surgery who did not see a need to remove his gallbladder. Some rectal bleeding a couple weeks ago minimal to moderate, no symptoms of anemia and otherwise no GI bleeding noted. Papaya enzyme helps his epigastric pain. Surgical consult and changed him from omeprazole to pantoprazole. It was noted he was due for his yearly physical labs within the next 2 months and we requested CBC results to be sent to our office. Follow-up for any recurrent bleeding. Because of continuing right upper quadrant pain and evidence of gallstones and wall thickness on ultrasound a HIDA scan was ordered. Recommend follow-up in 3 months.  It does not appear his HIDA scan have been completed.  Today he states he's had a rough couple months. His RUQ pain is resolved at this time, still with postprandial nausea. No pain in a couple weeks. His BP has been labile recently and his cardiologist is seeing him for this and it is associated with occasional weakness. GERD doing well on PPI. Denies hematochezia, melena, unintentional weight loss, fever, chills. Appetite isn't very good "I eat but I'm not really hungry when I do eat." He does have weakness when he stands and moves around and has leg weakness; nausea is not associated with this. Denies chest  pain, dyspnea, dizziness, lightheadedness, syncope, near syncope. Denies any other upper or lower GI symptoms.  PCP has a generalized U/S scheduled for this week.  Past Medical History:  Diagnosis Date  . Allergy to ACE inhibitors    Angioedema many years ago; patient has tolerated Losartan (ARB) in the past - it was held during [facility] for GI bleed and worsening renal function >> resume Losartan 25 mg QD 06/2015  . Arthritis   . Atrial fibrillation (HCC)    PAF, CHADs2Vasc = 5  . Carotid artery disease (HCC)    60-79% LICA  . CHF (congestive heart failure) (HCC)   . Coronary artery disease   . Diabetes mellitus   . GERD (gastroesophageal reflux disease)   . Hyperlipidemia   . Hypertension   . ICD (implantable cardiac defibrillator) in place 03/28/2012   Biotronik, Dr. Ladona Ridgel 03/28/12  . Ischemic cardiomyopathy    S/P CABG x 3; EF 25%  . Mitral regurgitation    S/P mitral valve repair 2013  . Myocardial infarction (HCC)   . Renal insufficiency     Past Surgical History:  Procedure Laterality Date  . COLONOSCOPY N/A 04/24/2014   RMR Pancolonic diverticulosis  . CORONARY ARTERY BYPASS GRAFT  10/17/2011   Procedure: CORONARY ARTERY BYPASS GRAFTING (CABG);  Surgeon: Purcell Nails, MD;  Location: [facility] OR;  Service: Open Heart Surgery;  Laterality: N/A;  . ESOPHAGOGASTRODUODENOSCOPY N/A 04/24/2014   RMR Subtle nodularity the gastric mucosa of uncertain significance-status post biopsy. Hiatal hernia.  chronic inflammation, no H.pylori  . ESOPHAGOGASTRODUODENOSCOPY N/A 06/24/2016   Procedure: ESOPHAGOGASTRODUODENOSCOPY (EGD);  Surgeon: Corbin Ade, MD;  Location: [facility] ENDO [facility];  Service: Endoscopy;  Laterality: N/A;  . ICD  03/28/2012  . IMPLANTABLE CARDIOVERTER DEFIBRILLATOR IMPLANT N/A 03/28/2012   Procedure: IMPLANTABLE CARDIOVERTER DEFIBRILLATOR IMPLANT;  Surgeon: Marinus Maw, MD;  Location: [facility] CATH LAB;  Service: Cardiovascular;  Laterality: N/A;  . KNEE ARTHROSCOPY  ~ 2008    right  . LEFT AND RIGHT HEART CATHETERIZATION WITH CORONARY ANGIOGRAM N/A 10/12/2011   Procedure: LEFT AND RIGHT HEART CATHETERIZATION WITH CORONARY ANGIOGRAM;  Surgeon: Iran Ouch, MD;  Location: [facility] CATH LAB;  Service: Cardiovascular;  Laterality: N/A;  . MITRAL VALVE REPAIR  10/17/2011   Procedure: MITRAL VALVE REPAIR (MVR);  Surgeon: Purcell Nails, MD;  Location: [facility] OR;  Service: Open Heart Surgery;  Laterality: N/A;  . NASAL SINUS SURGERY  1990's   right    Current Outpatient Prescriptions  Medication Sig Dispense Refill  . apixaban (ELIQUIS) 5 MG TABS tablet Take 1 tablet (5 mg total) by mouth 2 (two) times daily. 180 tablet 3  . CALCIUM-MAGNESIUM-ZINC PO Take 2 tablets by mouth daily.    . carvedilol (COREG) 6.25 MG tablet Take 1 tablet (6.25 mg total) by mouth 2 (two) times daily. 180 tablet 3  . furosemide (LASIX) 20 MG tablet Take 20 mg tablet by month (1 tablet) alternating with 40 mg (2 tablets) every other day. 90 tablet 3  . glipiZIDE (GLUCOTROL XL) 10 MG 24 hr tablet Take 10 mg by mouth 2 times daily at 12 noon and 4 pm. Pt takes it in the AM and PM    . Insulin Glargine (TOUJEO SOLOSTAR) 300 UNIT/ML SOPN Inject 12-15 Units as directed daily.     [name] Kitchen losartan (COZAAR) 25 MG tablet Take 1 tablet (25 mg total) by mouth daily. 30 tablet 11  . Multiple Vitamins-Minerals (MULTIVITAMINS THER. W/MINERALS) TABS Take 1 tablet by mouth daily.    . pantoprazole (PROTONIX) 40 MG tablet Take 1 tablet by mouth daily.    . rosuvastatin (CRESTOR) 20 MG tablet Take 40 mg by mouth every evening.     . testosterone cypionate (DEPO-TESTOSTERONE) 200 MG/ML injection Inject 0.5 mg into the muscle every 21 ( twenty-one) days. INJECT 0.6ML- 0.8ML EVERY 2 WEEKS     No current facility-administered medications for this visit.     Allergies as of 01/10/2017 - Review Complete 01/10/2017  Allergen Reaction Noted  . Ace inhibitors Swelling and Other (See Comments) 02/03/2014  . Penicillins  Palpitations 10/10/2011    Family History  Problem Relation Age of Onset  . Heart disease Mother   . Diabetes Father   . Cardiomyopathy Father   . Colon cancer Neg Hx     Social History   Social History  . Marital status: Married    Spouse name: N/A  . Number of children: N/A  . Years of education: N/A   Occupational History  . Retired    Social History Main Topics  . Smoking status: Never Smoker  . Smokeless tobacco: Never Used  . Alcohol use No  . Drug use: No  . Sexual activity: Yes   Other Topics Concern  . None   Social History Narrative  . None    Review of Systems: General: Negative for anorexia, weight loss, fever, chills, fatigue, weakness. Eyes: Negative for vision changes.  ENT: Negative for hoarseness, difficulty swallowing , nasal congestion. CV: Negative for  chest pain, angina, palpitations, dyspnea on exertion, peripheral edema.  Respiratory: Negative for dyspnea at rest, dyspnea on exertion, cough, sputum, wheezing.  GI: See history of present illness. GU:  Negative for dysuria, hematuria, urinary incontinence, urinary frequency, nocturnal urination.  MS: Negative for joint pain, low back pain.  Derm: Negative for rash or itching.  Neuro: Negative for weakness, abnormal sensation, seizure, frequent headaches, memory loss, confusion.  Psych: Negative for anxiety, depression, suicidal ideation, hallucinations.  Endo: Negative for unusual weight change.  Heme: Negative for bruising or bleeding. Allergy: Negative for rash or hives.   Physical Exam: BP 109/64   Pulse 65   Temp 97.3 F (36.3 C) (Oral)   Ht 6\' 5"  (1.956 m)   Wt 241 lb 12.8 oz (109.7 kg)   BMI 28.67 kg/m  General:   Alert and oriented. Pleasant and cooperative. Well-nourished and well-developed.  Head:  Normocephalic and atraumatic. Eyes:  Without icterus, sclera clear and conjunctiva pink.  Ears:  Normal auditory acuity. Mouth:  No deformity or lesions, oral mucosa pink.    Throat/Neck:  Supple, without mass or thyromegaly. Cardiovascular:  S1, S2 present without murmurs appreciated. Normal pulses noted. Extremities without clubbing or edema. Respiratory:  Clear to auscultation bilaterally. No wheezes, rales, or rhonchi. No distress.  Gastrointestinal:  +BS, soft, non-tender and non-distended. No HSM noted. No guarding or rebound. No masses appreciated.  Rectal:  Deferred  Musculoskalatal:  Symmetrical without gross deformities. Normal posture. Skin:  Intact without significant lesions or rashes. Neurologic:  Alert and oriented x4;  grossly normal neurologically. Psych:  Alert and cooperative. Normal mood and affect. Heme/Lymph/Immune: No significant cervical adenopathy. No excessive bruising noted.    01/10/2017 11:54 AM   Disclaimer: This note was dictated with voice recognition software. Similar sounding words can inadvertently be transcribed and may not be corrected upon review.

## 2017-01-10 NOTE — Assessment & Plan Note (Signed)
The patient has had some nausea and decreased appetite. Right upper quadrant ultrasound previously showed gallstones with mild thickening of the gallbladder wall. Surgeon was consult at did not feel his symptoms were gallbladder in etiology. He has a full abdominal ultrasound upcoming. HIDA scan was canceled due to needing to replace his roof and not having the money. I discussed we would possibly need to complete a HIDA scan down the road if he continues to have symptoms and no other source can be found. Return for follow-up in 2 months. In the meantime I have sent an Zofran to his pharmacy to help with symptomatic treatment.

## 2017-01-10 NOTE — Progress Notes (Signed)
cc'ed to pcp °

## 2017-01-10 NOTE — Assessment & Plan Note (Signed)
Generally well controlled on PPI. Continue medications, return for follow-up as needed. Unlikely source of worsening nausea.

## 2017-01-10 NOTE — Patient Instructions (Signed)
1. Have your blood tests drawn when you're able to. 2. When you complete your stool test at home, bring it back to our office. 3. Keep your appointment for your ultrasound coming up this week. 4. Keep your appointment with cardiology coming up in a couple weeks. 5. Call us if any worsening symptoms. 6. Otherwise, return for follow-up in 2 months.

## 2017-01-10 NOTE — Assessment & Plan Note (Signed)
Diverticular bleed in October 2017 with hemoglobin down to 7.7. He rebounded nicely and in February his hemoglobin was 12.6. Cardiology just completed labs and showed his hemoglobin is back down into the upper 10 range. Remains on Eliquis. At this point I will check an iFOBT and CBC as it is been a couple weeks to ensure he is not continuing to decline. He denies red or melena blood currently. Depending on his results may need to coordinate with cardiology related to feasibility of continued anticoagulation for atrial fibrillation. Return for follow-up in 2 months.

## 2017-01-12 ENCOUNTER — Other Ambulatory Visit (HOSPITAL_COMMUNITY)
Admission: RE | Admit: 2017-01-12 | Discharge: 2017-01-12 | Disposition: A | Discharge status: Home / Self Care | Payer: Medicare HMO | Source: Ambulatory Visit | Attending: Nurse Practitioner | Admitting: Nurse Practitioner

## 2017-01-12 ENCOUNTER — Ambulatory Visit (HOSPITAL_COMMUNITY)
Admission: RE | Admit: 2017-01-12 | Discharge: 2017-01-12 | Disposition: A | Discharge status: Home / Self Care | Payer: Medicare HMO | Source: Ambulatory Visit | Attending: *Deleted | Admitting: *Deleted

## 2017-01-12 DIAGNOSIS — K8020 Calculus of gallbladder without cholecystitis without obstruction: Secondary | ICD-10-CM | POA: Insufficient documentation

## 2017-01-12 DIAGNOSIS — R1011 Right upper quadrant pain: Secondary | ICD-10-CM | POA: Diagnosis present

## 2017-01-12 DIAGNOSIS — R110 Nausea: Secondary | ICD-10-CM | POA: Insufficient documentation

## 2017-01-12 DIAGNOSIS — D62 Acute posthemorrhagic anemia: Secondary | ICD-10-CM | POA: Diagnosis present

## 2017-01-12 DIAGNOSIS — K219 Gastro-esophageal reflux disease without esophagitis: Secondary | ICD-10-CM | POA: Diagnosis present

## 2017-01-12 LAB — CBC WITH DIFFERENTIAL/PLATELET
Basophils Absolute: 0 10*3/uL (ref 0.0–0.1)
Basophils Relative: 0 %
EOS PCT: 1 %
Eosinophils Absolute: 0.1 10*3/uL (ref 0.0–0.7)
HCT: 36.6 % — ABNORMAL LOW (ref 39.0–52.0)
Hemoglobin: 11.4 g/dL — ABNORMAL LOW (ref 13.0–17.0)
LYMPHS ABS: 1.9 10*3/uL (ref 0.7–4.0)
LYMPHS PCT: 16 %
MCH: 23.3 pg — AB (ref 26.0–34.0)
MCHC: 31.1 g/dL (ref 30.0–36.0)
MCV: 74.7 fL — ABNORMAL LOW (ref 78.0–100.0)
MONO ABS: 1.2 10*3/uL — AB (ref 0.1–1.0)
Monocytes Relative: 10 %
Neutro Abs: 8.8 10*3/uL — ABNORMAL HIGH (ref 1.7–7.7)
Neutrophils Relative %: 73 %
PLATELETS: 276 10*3/uL (ref 150–400)
RBC: 4.9 MIL/uL (ref 4.22–5.81)
RDW: 17 % — AB (ref 11.5–15.5)
WBC: 12 10*3/uL — ABNORMAL HIGH (ref 4.0–10.5)

## 2017-01-12 NOTE — Progress Notes (Signed)
Patient called in to let us know he had his labs drawn that Minerva Areola ordered and his pcp ordered an abd u/s that was completed today at [facility]

## 2017-01-17 ENCOUNTER — Ambulatory Visit (INDEPENDENT_AMBULATORY_CARE_PROVIDER_SITE_OTHER): Payer: Medicare HMO

## 2017-01-17 DIAGNOSIS — D509 Iron deficiency anemia, unspecified: Secondary | ICD-10-CM

## 2017-01-17 LAB — IFOBT (OCCULT BLOOD): IFOBT: NEGATIVE

## 2017-01-17 NOTE — Progress Notes (Signed)
Pt returned IFOBT test and it was NEGATIVE 

## 2017-01-30 ENCOUNTER — Other Ambulatory Visit: Payer: Self-pay

## 2017-01-30 DIAGNOSIS — D649 Anemia, unspecified: Secondary | ICD-10-CM

## 2017-01-31 ENCOUNTER — Ambulatory Visit: Payer: Medicare HMO | Admitting: Nurse Practitioner

## 2017-02-02 ENCOUNTER — Ambulatory Visit (INDEPENDENT_AMBULATORY_CARE_PROVIDER_SITE_OTHER): Payer: Medicare HMO | Admitting: Internal Medicine

## 2017-02-02 ENCOUNTER — Encounter: Payer: Self-pay | Admitting: Internal Medicine

## 2017-02-02 VITALS — BP 122/72 | HR 66 | Ht 77.0 in | Wt 241.0 lb

## 2017-02-02 DIAGNOSIS — I442 Atrioventricular block, complete: Secondary | ICD-10-CM

## 2017-02-02 DIAGNOSIS — I5022 Chronic systolic (congestive) heart failure: Secondary | ICD-10-CM | POA: Diagnosis not present

## 2017-02-02 DIAGNOSIS — I482 Chronic atrial fibrillation, unspecified: Secondary | ICD-10-CM

## 2017-02-02 DIAGNOSIS — I255 Ischemic cardiomyopathy: Secondary | ICD-10-CM | POA: Diagnosis not present

## 2017-02-02 NOTE — Patient Instructions (Signed)
Your physician wants you to follow-up in: 1 Year with Dr. Ladona Ridgel. You will receive a reminder letter in the mail two months in advance. If you don't receive a letter, please call our office to schedule the follow-up appointment.   Remote monitoring is used to monitor your Pacemaker of ICD from home. This monitoring reduces the number of office visits required to check your device to one time per year. It allows [place] to keep an eye on the functioning of your device to ensure it is working properly. You are scheduled for a device check from home on 05/04/17. You may send your transmission at any time that day. If you have a wireless device, the transmission will be sent automatically. After your physician reviews your transmission, you will receive a postcard with your next transmission date.  Your physician recommends that you continue on your current medications as directed. Please refer to the Current Medication list given to you today.  If you need a refill on your cardiac medications before your next appointment, please call your pharmacy.  Thank you for choosing [facility]!

## 2017-02-02 NOTE — Progress Notes (Signed)
HPI Wayne Gordon returns for followup. He is a pleasant 83 yo man with an ICM, s/p remote MI, s/p CABG with an EF 25%, chronic atrial fib and CHB, s/p ICD implant. He has a VDD device and has been pacing in the ventricle for the past year due to gradual decline in his conduction. Despite this, he has only class 2 CHF. He has been bothered by right upper quadrant pain and is thought to have biliary disease although he has not had to have any surgery.   Current Outpatient Prescriptions  Medication Sig Dispense Refill  . apixaban (ELIQUIS) 5 MG TABS tablet Take 1 tablet (5 mg total) by mouth 2 (two) times daily. 180 tablet 3  . CALCIUM-MAGNESIUM-ZINC PO Take 2 tablets by mouth daily.    . carvedilol (COREG) 6.25 MG tablet Take 1 tablet (6.25 mg total) by mouth 2 (two) times daily. 180 tablet 3  . furosemide (LASIX) 20 MG tablet Take 20 mg tablet by month (1 tablet) alternating with 40 mg (2 tablets) every other day. 90 tablet 3  . glipiZIDE (GLUCOTROL XL) 10 MG 24 hr tablet Take 10 mg by mouth 2 times daily at 12 noon and 4 pm. Pt takes it in the AM and PM    . Insulin Glargine (TOUJEO SOLOSTAR) 300 UNIT/ML SOPN Inject 12-15 Units as directed daily.     [name] Kitchen losartan (COZAAR) 25 MG tablet Take 1 tablet (25 mg total) by mouth daily. 30 tablet 11  . Multiple Vitamins-Minerals (MULTIVITAMINS THER. W/MINERALS) TABS Take 1 tablet by mouth daily.    . ondansetron (ZOFRAN) 4 MG tablet Take 1 tablet (4 mg total) by mouth every 8 (eight) hours as needed for nausea or vomiting. 30 tablet 1  . pantoprazole (PROTONIX) 40 MG tablet Take 1 tablet by mouth daily.    . rosuvastatin (CRESTOR) 20 MG tablet Take 40 mg by mouth every evening.     . testosterone cypionate (DEPO-TESTOSTERONE) 200 MG/ML injection Inject 0.5 mg into the muscle every 21 ( twenty-one) days. INJECT 0.6ML- 0.8ML EVERY 2 WEEKS     No current facility-administered medications for this visit.      Past Medical History:  Diagnosis Date    . Allergy to ACE inhibitors    Angioedema many years ago; patient has tolerated Losartan (ARB) in the past - it was held during [facility] for GI bleed and worsening renal function >> resume Losartan 25 mg QD 06/2015  . Arthritis   . Atrial fibrillation (HCC)    PAF, CHADs2Vasc = 5  . Carotid artery disease (HCC)    60-79% LICA  . CHF (congestive heart failure) (HCC)   . Coronary artery disease   . Diabetes mellitus   . GERD (gastroesophageal reflux disease)   . Hyperlipidemia   . Hypertension   . ICD (implantable cardiac defibrillator) in place 03/28/2012   [place]ron[place], Dr. Ladona Ridgel 03/28/12  . Ischemic cardiomyopathy    S/P CABG x 3; EF 25%  . Mitral regurgitation    S/P mitral valve repair 2013  . Myocardial infarction (HCC)   . Renal insufficiency     ROS:   All systems reviewed and negative except as noted in the HPI.   Past Surgical History:  Procedure Laterality Date  . COLONOSCOPY N/A 04/24/2014   RMR Pancolonic diverticulosis  . CORONARY ARTERY BYPASS GRAFT  10/17/2011   Procedure: CORONARY ARTERY BYPASS GRAFTING (CABG);  Surgeon: Purcell Nails, MD;  Location: [facility] OR;  Service:  Open Heart Surgery;  Laterality: N/A;  . ESOPHAGOGASTRODUODENOSCOPY N/A 04/24/2014   RMR Subtle nodularity the gastric mucosa of uncertain significance-status post biopsy. Hiatal hernia. chronic inflammation, no H.pylori  . ESOPHAGOGASTRODUODENOSCOPY N/A 06/24/2016   Procedure: ESOPHAGOGASTRODUODENOSCOPY (EGD);  Surgeon: Corbin Ade, MD;  Location: [facility] ENDO SUITE;  Service: Endoscopy;  Laterality: N/A;  . ICD  03/28/2012  . IMPLANTABLE CARDIOVERTER DEFIBRILLATOR IMPLANT N/A 03/28/2012   Procedure: IMPLANTABLE CARDIOVERTER DEFIBRILLATOR IMPLANT;  Surgeon: Marinus Maw, MD;  Location: [facility] CATH LAB;  Service: Cardiovascular;  Laterality: N/A;  . KNEE ARTHROSCOPY  ~ 2008   right  . LEFT AND RIGHT HEART CATHETERIZATION WITH CORONARY ANGIOGRAM N/A 10/12/2011   Procedure: LEFT AND RIGHT HEART  CATHETERIZATION WITH CORONARY ANGIOGRAM;  Surgeon: Iran Ouch, MD;  Location: [facility] CATH LAB;  Service: Cardiovascular;  Laterality: N/A;  . MITRAL VALVE REPAIR  10/17/2011   Procedure: MITRAL VALVE REPAIR (MVR);  Surgeon: Purcell Nails, MD;  Location: [facility] OR;  Service: Open Heart Surgery;  Laterality: N/A;  . NASAL SINUS SURGERY  1990's   right     Family History  Problem Relation Age of Onset  . Heart disease Mother   . Diabetes Father   . Cardiomyopathy Father   . Colon cancer Neg Hx      Social History   Social History  . Marital status: Married    Spouse name: N/A  . Number of children: N/A  . Years of education: N/A   Occupational History  . Retired    Social History Main Topics  . Smoking status: Never Smoker  . Smokeless tobacco: Never Used  . Alcohol use No  . Drug use: No  . Sexual activity: Yes   Other Topics Concern  . Not on file   Social History Narrative  . No narrative on file     BP 122/72   Pulse 66   Ht 6\' 5"  (1.956 m)   Wt 241 lb (109.3 kg)   SpO2 98%   BMI 28.58 kg/m   Physical Exam:  Well appearing NAD HEENT: Unremarkable Neck:  No JVD, no thyromegally Lymphatics:  No adenopathy Back:  No CVA tenderness Lungs:  Clear with no wheezes HEART:  Regular rate rhythm, no murmurs, no rubs, no clicks Abd:  soft, positive bowel sounds, no organomegally, no rebound, no guarding Ext:  2 plus pulses, no edema, no cyanosis, no clubbing Skin:  No rashes no nodules Neuro:  CN II through XII intact, motor grossly intact   DEVICE  Normal device function.  See PaceArt for details.   Assess/Plan: 1. Atrial fib - his ventricular rate is well controlled. He will continue Eliquis. 2. Chronic systolic heart failure - despite his CHB, and ventricular pacing, his CHF remains class 2. I have asked him to notify us if his symptoms of sob worsen. 3. ICD - his [employer] VDD ICD is working normally. We have reprogrammed the device today turning rate  response on. 4. CAD - he remains active and denies anginal symptoms.  Leonia Reeves.D.

## 2017-02-08 LAB — CUP PACEART INCLINIC DEVICE CHECK
Battery Voltage: 3.12 V
Brady Statistic RV Percent Paced: 95 %
Date Time Interrogation Session: 20180607130400
HIGH POWER IMPEDANCE MEASURED VALUE: 84 Ohm
Implantable Lead Location: 753860
Lead Channel Impedance Value: 621 Ohm
Lead Channel Sensing Intrinsic Amplitude: 3.1 mV
Lead Channel Setting Pacing Amplitude: 2.5 V
Lead Channel Setting Pacing Pulse Width: 0.4 ms
Lead Channel Setting Sensing Sensitivity: 0.8 mV
MDC IDC LEAD IMPLANT DT: 20130731
MDC IDC LEAD MODEL: 365501
MDC IDC LEAD SERIAL: 10505793
MDC IDC MSMT LEADCHNL RV PACING THRESHOLD AMPLITUDE: 0.6 V
MDC IDC MSMT LEADCHNL RV PACING THRESHOLD PULSEWIDTH: 0.4 ms
MDC IDC PG IMPLANT DT: 20130731
Pulse Gen Serial Number: 60687186

## 2017-02-10 ENCOUNTER — Telehealth: Payer: Self-pay | Admitting: *Deleted

## 2017-02-10 NOTE — Telephone Encounter (Signed)
Wayne Gordon calling concerned about HR being 55bpm. His lower rate has been programmed at 55bpm for several years. He says that he has had normal BPs and every time he checks his HR it is at 55. He's convinced that his lower pacing rate was previously set at 65bpm, I verified that his lower rate has been set at 55bpm for several years. He verbalizes understanding.

## 2017-03-03 ENCOUNTER — Ambulatory Visit (HOSPITAL_COMMUNITY): Payer: Medicare HMO

## 2017-03-15 ENCOUNTER — Ambulatory Visit: Payer: Medicare HMO | Admitting: Nurse Practitioner

## 2017-05-04 ENCOUNTER — Ambulatory Visit (INDEPENDENT_AMBULATORY_CARE_PROVIDER_SITE_OTHER): Payer: Medicare HMO | Admitting: *Deleted

## 2017-05-04 DIAGNOSIS — I255 Ischemic cardiomyopathy: Secondary | ICD-10-CM | POA: Diagnosis not present

## 2017-05-05 NOTE — Progress Notes (Signed)
Remote ICD transmission.   

## 2017-05-09 ENCOUNTER — Encounter: Payer: Self-pay | Admitting: Cardiology

## 2017-05-30 LAB — CUP PACEART REMOTE DEVICE CHECK
Brady Statistic RV Percent Paced: 98 %
HighPow Impedance: 86 Ohm
Implantable Lead Implant Date: 20130731
Implantable Lead Location: 753860
Implantable Lead Serial Number: 10505793
Implantable Pulse Generator Implant Date: 20130731
Lead Channel Impedance Value: 561 Ohm
Lead Channel Setting Pacing Pulse Width: 0.4 ms
MDC IDC LEAD MODEL: 365501
MDC IDC MSMT BATTERY REMAINING PERCENTAGE: 66 %
MDC IDC MSMT BATTERY VOLTAGE: 3.12 V
MDC IDC SESS DTM: 20181001055014
MDC IDC SET LEADCHNL RV PACING AMPLITUDE: 2.5 V
MDC IDC SET LEADCHNL RV SENSING SENSITIVITY: 0.8 mV
MDC IDC STAT BRADY AS VP PERCENT: 99 %
MDC IDC STAT BRADY AS VS PERCENT: 1 %
Pulse Gen Serial Number: 60687186

## 2017-07-11 ENCOUNTER — Telehealth: Payer: Self-pay | Admitting: Internal Medicine

## 2017-07-11 MED ORDER — APIXABAN 5 MG PO TABS: 5.0000 mg | ORAL_TABLET | Freq: Two times a day (BID) | ORAL | 0 refills | Status: DC

## 2017-07-11 NOTE — Telephone Encounter (Signed)
Patient advised that samples are available for pick up at the [facility] office. Patient verbalized understanding.

## 2017-07-11 NOTE — Telephone Encounter (Signed)
Would like some samples of Eliquis --in hole

## 2017-08-03 ENCOUNTER — Ambulatory Visit (INDEPENDENT_AMBULATORY_CARE_PROVIDER_SITE_OTHER): Payer: Medicare HMO | Admitting: *Deleted

## 2017-08-03 DIAGNOSIS — I255 Ischemic cardiomyopathy: Secondary | ICD-10-CM

## 2017-08-03 NOTE — Progress Notes (Signed)
Remote ICD transmission.   

## 2017-08-11 ENCOUNTER — Encounter: Payer: Self-pay | Admitting: Cardiology

## 2017-08-11 LAB — CUP PACEART REMOTE DEVICE CHECK
Battery Remaining Percentage: 63 %
Brady Statistic AS VP Percent: 98 %
Brady Statistic AS VS Percent: 2 %
Date Time Interrogation Session: 20181214074134
HIGH POWER IMPEDANCE MEASURED VALUE: 89 Ohm
Lead Channel Impedance Value: 573 Ohm
Lead Channel Setting Pacing Amplitude: 2.5 V
Lead Channel Setting Sensing Sensitivity: 0.8 mV
MDC IDC LEAD IMPLANT DT: 20130731
MDC IDC LEAD LOCATION: 753860
MDC IDC LEAD MODEL: 365501
MDC IDC LEAD SERIAL: 10505793
MDC IDC MSMT BATTERY VOLTAGE: 3.12 V
MDC IDC PG IMPLANT DT: 20130731
MDC IDC SET LEADCHNL RV PACING PULSEWIDTH: 0.4 ms
MDC IDC STAT BRADY RV PERCENT PACED: 97 %
Pulse Gen Serial Number: 60687186

## 2017-09-17 ENCOUNTER — Inpatient Hospital Stay (HOSPITAL_COMMUNITY)
Admission: EM | Admit: 2017-09-17 | Discharge: 2017-09-22 | DRG: 854 | Disposition: A | Discharge status: Home / Self Care | Payer: Medicare HMO | Attending: Internal Medicine | Admitting: Internal Medicine

## 2017-09-17 ENCOUNTER — Encounter (HOSPITAL_COMMUNITY): Payer: Self-pay | Admitting: Emergency Medicine

## 2017-09-17 ENCOUNTER — Other Ambulatory Visit: Payer: Self-pay

## 2017-09-17 ENCOUNTER — Emergency Department (HOSPITAL_COMMUNITY): Payer: Medicare HMO

## 2017-09-17 DIAGNOSIS — E785 Hyperlipidemia, unspecified: Secondary | ICD-10-CM | POA: Diagnosis present

## 2017-09-17 DIAGNOSIS — N179 Acute kidney failure, unspecified: Secondary | ICD-10-CM | POA: Diagnosis present

## 2017-09-17 DIAGNOSIS — R1013 Epigastric pain: Secondary | ICD-10-CM | POA: Diagnosis not present

## 2017-09-17 DIAGNOSIS — E872 Acidosis, unspecified: Secondary | ICD-10-CM | POA: Diagnosis present

## 2017-09-17 DIAGNOSIS — K219 Gastro-esophageal reflux disease without esophagitis: Secondary | ICD-10-CM

## 2017-09-17 DIAGNOSIS — I4891 Unspecified atrial fibrillation: Secondary | ICD-10-CM | POA: Diagnosis present

## 2017-09-17 DIAGNOSIS — R031 Nonspecific low blood-pressure reading: Secondary | ICD-10-CM | POA: Diagnosis present

## 2017-09-17 DIAGNOSIS — I252 Old myocardial infarction: Secondary | ICD-10-CM | POA: Diagnosis not present

## 2017-09-17 DIAGNOSIS — Z79899 Other long term (current) drug therapy: Secondary | ICD-10-CM | POA: Diagnosis not present

## 2017-09-17 DIAGNOSIS — Z9889 Other specified postprocedural states: Secondary | ICD-10-CM | POA: Diagnosis not present

## 2017-09-17 DIAGNOSIS — E871 Hypo-osmolality and hyponatremia: Secondary | ICD-10-CM | POA: Diagnosis present

## 2017-09-17 DIAGNOSIS — I482 Chronic atrial fibrillation: Secondary | ICD-10-CM | POA: Diagnosis not present

## 2017-09-17 DIAGNOSIS — I2510 Atherosclerotic heart disease of native coronary artery without angina pectoris: Secondary | ICD-10-CM | POA: Diagnosis present

## 2017-09-17 DIAGNOSIS — N183 Chronic kidney disease, stage 3 unspecified: Secondary | ICD-10-CM | POA: Diagnosis present

## 2017-09-17 DIAGNOSIS — I480 Paroxysmal atrial fibrillation: Secondary | ICD-10-CM | POA: Diagnosis present

## 2017-09-17 DIAGNOSIS — A419 Sepsis, unspecified organism: Secondary | ICD-10-CM

## 2017-09-17 DIAGNOSIS — M549 Dorsalgia, unspecified: Secondary | ICD-10-CM | POA: Diagnosis present

## 2017-09-17 DIAGNOSIS — A4159 Other Gram-negative sepsis: Secondary | ICD-10-CM | POA: Diagnosis present

## 2017-09-17 DIAGNOSIS — K8309 Other cholangitis: Secondary | ICD-10-CM

## 2017-09-17 DIAGNOSIS — R109 Unspecified abdominal pain: Secondary | ICD-10-CM | POA: Diagnosis not present

## 2017-09-17 DIAGNOSIS — G8929 Other chronic pain: Secondary | ICD-10-CM | POA: Diagnosis present

## 2017-09-17 DIAGNOSIS — I130 Hypertensive heart and chronic kidney disease with heart failure and stage 1 through stage 4 chronic kidney disease, or unspecified chronic kidney disease: Secondary | ICD-10-CM | POA: Diagnosis present

## 2017-09-17 DIAGNOSIS — Z7901 Long term (current) use of anticoagulants: Secondary | ICD-10-CM | POA: Diagnosis not present

## 2017-09-17 DIAGNOSIS — I255 Ischemic cardiomyopathy: Secondary | ICD-10-CM | POA: Diagnosis present

## 2017-09-17 DIAGNOSIS — E119 Type 2 diabetes mellitus without complications: Secondary | ICD-10-CM

## 2017-09-17 DIAGNOSIS — R748 Abnormal levels of other serum enzymes: Secondary | ICD-10-CM

## 2017-09-17 DIAGNOSIS — Z95810 Presence of automatic (implantable) cardiac defibrillator: Secondary | ICD-10-CM | POA: Diagnosis present

## 2017-09-17 DIAGNOSIS — I361 Nonrheumatic tricuspid (valve) insufficiency: Secondary | ICD-10-CM | POA: Diagnosis not present

## 2017-09-17 DIAGNOSIS — I10 Essential (primary) hypertension: Secondary | ICD-10-CM | POA: Diagnosis not present

## 2017-09-17 DIAGNOSIS — D72825 Bandemia: Secondary | ICD-10-CM

## 2017-09-17 DIAGNOSIS — A414 Sepsis due to anaerobes: Secondary | ICD-10-CM

## 2017-09-17 DIAGNOSIS — K8020 Calculus of gallbladder without cholecystitis without obstruction: Secondary | ICD-10-CM | POA: Diagnosis present

## 2017-09-17 DIAGNOSIS — R7989 Other specified abnormal findings of blood chemistry: Secondary | ICD-10-CM | POA: Diagnosis present

## 2017-09-17 DIAGNOSIS — Z888 Allergy status to other drugs, medicaments and biological substances status: Secondary | ICD-10-CM

## 2017-09-17 DIAGNOSIS — Z4502 Encounter for adjustment and management of automatic implantable cardiac defibrillator: Secondary | ICD-10-CM

## 2017-09-17 DIAGNOSIS — R778 Other specified abnormalities of plasma proteins: Secondary | ICD-10-CM | POA: Diagnosis present

## 2017-09-17 DIAGNOSIS — Z794 Long term (current) use of insulin: Secondary | ICD-10-CM

## 2017-09-17 DIAGNOSIS — I779 Disorder of arteries and arterioles, unspecified: Secondary | ICD-10-CM | POA: Diagnosis present

## 2017-09-17 DIAGNOSIS — K8010 Calculus of gallbladder with chronic cholecystitis without obstruction: Secondary | ICD-10-CM | POA: Diagnosis present

## 2017-09-17 DIAGNOSIS — K8071 Calculus of gallbladder and bile duct without cholecystitis with obstruction: Secondary | ICD-10-CM | POA: Diagnosis not present

## 2017-09-17 DIAGNOSIS — R7881 Bacteremia: Secondary | ICD-10-CM | POA: Diagnosis not present

## 2017-09-17 DIAGNOSIS — E1122 Type 2 diabetes mellitus with diabetic chronic kidney disease: Secondary | ICD-10-CM

## 2017-09-17 DIAGNOSIS — Z6828 Body mass index (BMI) 28.0-28.9, adult: Secondary | ICD-10-CM | POA: Diagnosis not present

## 2017-09-17 DIAGNOSIS — Z951 Presence of aortocoronary bypass graft: Secondary | ICD-10-CM | POA: Diagnosis not present

## 2017-09-17 DIAGNOSIS — I959 Hypotension, unspecified: Secondary | ICD-10-CM | POA: Diagnosis present

## 2017-09-17 DIAGNOSIS — I5022 Chronic systolic (congestive) heart failure: Secondary | ICD-10-CM | POA: Diagnosis present

## 2017-09-17 DIAGNOSIS — B961 Klebsiella pneumoniae [K. pneumoniae] as the cause of diseases classified elsewhere: Secondary | ICD-10-CM

## 2017-09-17 DIAGNOSIS — I739 Peripheral vascular disease, unspecified: Secondary | ICD-10-CM

## 2017-09-17 DIAGNOSIS — E230 Hypopituitarism: Secondary | ICD-10-CM | POA: Diagnosis present

## 2017-09-17 DIAGNOSIS — D72829 Elevated white blood cell count, unspecified: Secondary | ICD-10-CM | POA: Diagnosis present

## 2017-09-17 LAB — CBC WITH DIFFERENTIAL/PLATELET
BASOS ABS: 0 10*3/uL (ref 0.0–0.1)
Basophils Relative: 0 %
EOS PCT: 0 %
Eosinophils Absolute: 0 10*3/uL (ref 0.0–0.7)
HEMATOCRIT: 44.9 % (ref 39.0–52.0)
Hemoglobin: 15.1 g/dL (ref 13.0–17.0)
LYMPHS ABS: 1.4 10*3/uL (ref 0.7–4.0)
LYMPHS PCT: 5 %
MCH: 31.7 pg (ref 26.0–34.0)
MCHC: 33.6 g/dL (ref 30.0–36.0)
MCV: 94.3 fL (ref 78.0–100.0)
MONOS PCT: 6 %
Monocytes Absolute: 1.7 10*3/uL — ABNORMAL HIGH (ref 0.1–1.0)
NEUTROS PCT: 89 %
Neutro Abs: 25.7 10*3/uL — ABNORMAL HIGH (ref 1.7–7.7)
Platelets: 123 10*3/uL — ABNORMAL LOW (ref 150–400)
RBC: 4.76 MIL/uL (ref 4.22–5.81)
RDW: 13.9 % (ref 11.5–15.5)
WBC: 28.8 10*3/uL — AB (ref 4.0–10.5)

## 2017-09-17 LAB — COMPREHENSIVE METABOLIC PANEL
ALBUMIN: 3.3 g/dL — AB (ref 3.5–5.0)
ALT: 569 U/L — AB (ref 17–63)
AST: 382 U/L — AB (ref 15–41)
Alkaline Phosphatase: 184 U/L — ABNORMAL HIGH (ref 38–126)
Anion gap: 14 (ref 5–15)
BILIRUBIN TOTAL: 6.8 mg/dL — AB (ref 0.3–1.2)
BUN: 41 mg/dL — AB (ref 6–20)
CHLORIDE: 95 mmol/L — AB (ref 101–111)
CO2: 23 mmol/L (ref 22–32)
CREATININE: 3.44 mg/dL — AB (ref 0.61–1.24)
Calcium: 8.6 mg/dL — ABNORMAL LOW (ref 8.9–10.3)
GFR calc Af Amer: 18 mL/min — ABNORMAL LOW (ref >60)
GFR calc non Af Amer: 15 mL/min — ABNORMAL LOW (ref >60)
GLUCOSE: 156 mg/dL — AB (ref 65–99)
POTASSIUM: 4.6 mmol/L (ref 3.5–5.1)
Sodium: 132 mmol/L — ABNORMAL LOW (ref 135–145)
Total Protein: 6.6 g/dL (ref 6.5–8.1)

## 2017-09-17 LAB — GLUCOSE, CAPILLARY
Glucose-Capillary: 164 mg/dL — ABNORMAL HIGH (ref 65–99)
Glucose-Capillary: 222 mg/dL — ABNORMAL HIGH (ref 65–99)

## 2017-09-17 LAB — LIPASE, BLOOD: LIPASE: 23 U/L (ref 11–51)

## 2017-09-17 LAB — LACTIC ACID, PLASMA
Lactic Acid, Venous: 2.3 mmol/L (ref 0.5–1.9)
Lactic Acid, Venous: 1.9 mmol/L (ref 0.5–1.9)

## 2017-09-17 LAB — TSH: TSH: 3.293 u[IU]/mL (ref 0.350–4.500)

## 2017-09-17 LAB — TROPONIN I
TROPONIN I: 0.04 ng/mL — AB (ref <0.03)
Troponin I: 0.06 ng/mL (ref <0.03)

## 2017-09-17 MED ORDER — INSULIN ASPART 100 UNIT/ML ~~LOC~~ SOLN
0.0000 [IU] | Freq: Three times a day (TID) | SUBCUTANEOUS | Status: DC
  Administered 2017-09-18 (×2): 1 [IU] via SUBCUTANEOUS
  Administered 2017-09-19: 2 [IU] via SUBCUTANEOUS
  Administered 2017-09-19 – 2017-09-20 (×2): 1 [IU] via SUBCUTANEOUS
  Administered 2017-09-21: 2 [IU] via SUBCUTANEOUS
  Administered 2017-09-21 (×2): 3 [IU] via SUBCUTANEOUS
  Administered 2017-09-22: 2 [IU] via SUBCUTANEOUS
  Administered 2017-09-22: 1 [IU] via SUBCUTANEOUS

## 2017-09-17 MED ORDER — FENTANYL CITRATE (PF) 100 MCG/2ML IJ SOLN
50.0000 ug | Freq: Once | INTRAMUSCULAR | Status: AC
  Administered 2017-09-17: 50 ug via INTRAVENOUS
  Filled 2017-09-17: qty 2

## 2017-09-17 MED ORDER — ONDANSETRON HCL 4 MG PO TABS: 4.0000 mg | ORAL_TABLET | Freq: Four times a day (QID) | ORAL | Status: DC | PRN

## 2017-09-17 MED ORDER — SODIUM CHLORIDE 0.9 % IV BOLUS (SEPSIS)
2000.0000 mL | Freq: Once | INTRAVENOUS | Status: AC
  Administered 2017-09-17: 2000 mL via INTRAVENOUS

## 2017-09-17 MED ORDER — VANCOMYCIN HCL IN DEXTROSE 1-5 GM/200ML-% IV SOLN
1000.0000 mg | INTRAVENOUS | Status: AC
  Administered 2017-09-17 (×2): 1000 mg via INTRAVENOUS
  Filled 2017-09-17 (×2): qty 200

## 2017-09-17 MED ORDER — CARVEDILOL 6.25 MG PO TABS
6.2500 mg | ORAL_TABLET | Freq: Two times a day (BID) | ORAL | Status: DC
  Administered 2017-09-19 – 2017-09-22 (×7): 6.25 mg via ORAL
  Filled 2017-09-17 (×2): qty 1
  Filled 2017-09-17: qty 2
  Filled 2017-09-17 (×5): qty 1
  Filled 2017-09-17: qty 2

## 2017-09-17 MED ORDER — INSULIN GLARGINE 100 UNIT/ML ~~LOC~~ SOLN
12.0000 [IU] | Freq: Every day | SUBCUTANEOUS | Status: DC
  Administered 2017-09-18 – 2017-09-22 (×4): 12 [IU] via SUBCUTANEOUS
  Filled 2017-09-17 (×6): qty 0.12

## 2017-09-17 MED ORDER — SODIUM CHLORIDE 0.9 % IV SOLN
INTRAVENOUS | Status: AC
  Administered 2017-09-17 – 2017-09-18 (×2): via INTRAVENOUS
  Administered 2017-09-19: 75 mL/h via INTRAVENOUS

## 2017-09-17 MED ORDER — DEXTROSE 5 % IV SOLN
2.0000 g | Freq: Once | INTRAVENOUS | Status: AC
  Administered 2017-09-17: 2 g via INTRAVENOUS
  Filled 2017-09-17: qty 2

## 2017-09-17 MED ORDER — SODIUM CHLORIDE 0.9 % IV BOLUS (SEPSIS)
1000.0000 mL | Freq: Once | INTRAVENOUS | Status: AC
  Administered 2017-09-17: 1000 mL via INTRAVENOUS

## 2017-09-17 MED ORDER — ACETAMINOPHEN 650 MG RE SUPP: 650.0000 mg | Freq: Four times a day (QID) | RECTAL | Status: DC | PRN

## 2017-09-17 MED ORDER — HYDROXYZINE HCL 25 MG PO TABS: 25.0000 mg | ORAL_TABLET | Freq: Three times a day (TID) | ORAL | Status: DC | PRN

## 2017-09-17 MED ORDER — PANTOPRAZOLE SODIUM 40 MG PO TBEC
40.0000 mg | DELAYED_RELEASE_TABLET | Freq: Every day | ORAL | Status: DC
  Administered 2017-09-18 – 2017-09-22 (×4): 40 mg via ORAL
  Filled 2017-09-17 (×4): qty 1

## 2017-09-17 MED ORDER — MORPHINE SULFATE (PF) 2 MG/ML IV SOLN
1.0000 mg | INTRAVENOUS | Status: DC | PRN
Start: 2017-09-17 — End: 2017-09-19

## 2017-09-17 MED ORDER — VANCOMYCIN HCL 10 G IV SOLR: 2000.0000 mg | Freq: Once | INTRAVENOUS | Status: DC

## 2017-09-17 MED ORDER — ONDANSETRON HCL 4 MG/2ML IJ SOLN: 4.0000 mg | Freq: Four times a day (QID) | INTRAMUSCULAR | Status: DC | PRN

## 2017-09-17 MED ORDER — ACETAMINOPHEN 325 MG PO TABS
650.0000 mg | ORAL_TABLET | Freq: Four times a day (QID) | ORAL | Status: DC | PRN
  Administered 2017-09-18: 650 mg via ORAL
  Filled 2017-09-17: qty 2

## 2017-09-17 MED ORDER — DEXTROSE 5 % IV SOLN
1.0000 g | INTRAVENOUS | Status: DC
  Administered 2017-09-18: 1 g via INTRAVENOUS
  Filled 2017-09-17 (×2): qty 1

## 2017-09-17 MED ORDER — HYDROCODONE-ACETAMINOPHEN 5-325 MG PO TABS
1.0000 | ORAL_TABLET | ORAL | Status: DC | PRN
  Administered 2017-09-17 – 2017-09-21 (×5): 2 via ORAL
  Filled 2017-09-17 (×5): qty 2

## 2017-09-17 NOTE — ED Provider Notes (Addendum)
Emergency Department Provider Note   I have reviewed the triage vital signs and the nursing notes.   HISTORY  Chief Complaint Weakness   HPI Wayne Gordon is a 84 y.o. male with multiple medical problems as documented below the presents to the emergency department today for generalized weakness.  Patient is also had vomiting in the last 24 hours along with significant epigastric and right upper quadrant abdominal pain.  This is intermittent in nature is been present for a few months but does seem to be worse in the last couple days.  His weakness is progressively worsening yesterday and had an episode of chills\rigors.  He is been nauseous this whole time as well.  This is similar to his previous episodes but worse and more persistent so brought here for further evaluation.  Patient denies chest pain but does endorse some shortness of breath.  Has had decreased urine output his wife states that he passed golden urine approximately 3 tablespoons today and at all.  He had 2 Gatorade's and couple waters last night.  Has had decreased appetite as well. No other associated or modifying symptoms.    Past Medical History:  Diagnosis Date  . Allergy to ACE inhibitors    Angioedema many years ago; patient has tolerated Losartan (ARB) in the past - it was held during [facility] for GI bleed and worsening renal function >> resume Losartan 25 mg QD 06/2015  . Arthritis   . Atrial fibrillation (HCC)    PAF, CHADs2Vasc = 5  . Carotid artery disease (HCC)    60-79% LICA  . CHF (congestive heart failure) (HCC)   . Coronary artery disease   . Diabetes mellitus   . GERD (gastroesophageal reflux disease)   . Hyperlipidemia   . Hypertension   . ICD (implantable cardiac defibrillator) in place 03/28/2012   [place]ron[place], Dr. Ladona Ridgel 03/28/12  . Ischemic cardiomyopathy    S/P CABG x 3; EF 25%  . Mitral regurgitation    S/P mitral valve repair 2013  . Myocardial infarction (HCC)   . Renal insufficiency       Patient Active Problem List   Diagnosis Date Noted  . Cholangitis 09/17/2017  . Leukocytosis 09/17/2017  . Cholelithiasis 09/17/2017  . Elevated liver enzymes 09/17/2017  . Acute renal failure superimposed on stage 3 chronic kidney disease (HCC) 09/17/2017  . Sepsis (HCC) 09/17/2017  . Hypotension 09/17/2017  . Lactic acidosis 09/17/2017  . Elevated troponin 09/17/2017  . Nausea without vomiting 01/10/2017  . Gastrointestinal hemorrhage associated with intestinal diverticulosis 06/23/2016  . Carotid artery disease (HCC)   . Syncope and collapse 07/11/2015  . Ventricular arrhythmia 07/10/2015  . Complete heart block (HCC)   . ICD (implantable cardioverter-defibrillator) discharge   . Atrial fibrillation (HCC) 06/04/2015  . Diabetes (HCC) 06/02/2015  . 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/02/2015  . GERD (gastroesophageal reflux disease) 02/18/2015  . Abdominal pain, epigastric 02/18/2015  . Pleural effusion 09/01/2014  . Rectal bleeding 03/25/2014  . Acute blood loss anemia 03/25/2014  . Body mass index (BMI) of 28.0-28.9 in adult 09/30/2013  . Diabetes mellitus, type 2 (HCC) 08/09/2013  . PVC's (premature ventricular contractions) 07/04/2013  . HLD (hyperlipidemia) 01/03/2013  . Automatic implantable cardioverter-defibrillator in situ 03/30/2012  . S/P MVR (mitral valve repair) 01/16/2012  . HTN (hypertension) 11/28/2011  . Chronic systolic heart failure (HCC) 10/31/2011  . Ischemic cardiomyopathy 10/31/2011  . CKD (chronic kidney disease) stage 3, GFR 30-59 ml/min (HCC) 10/31/2011  . Current use of  long term anticoagulation 10/31/2011  . Long term (current) use of anticoagulants 10/28/2011  . S/P mitral valve repair 10/17/2011  . S/P CABG x 3 10/17/2011  . Mitral regurgitation 10/12/2011  . NSTEMI (non-ST elevated myocardial infarction) (HCC) 10/11/2011    Past Surgical History:  Procedure Laterality Date  . COLONOSCOPY N/A 04/24/2014   RMR Pancolonic diverticulosis   . CORONARY ARTERY BYPASS GRAFT  10/17/2011   Procedure: CORONARY ARTERY BYPASS GRAFTING (CABG);  Surgeon: Purcell Nails, MD;  Location: [facility] OR;  Service: Open Heart Surgery;  Laterality: N/A;  . ESOPHAGOGASTRODUODENOSCOPY N/A 04/24/2014   RMR Subtle nodularity the gastric mucosa of uncertain significance-status post biopsy. Hiatal hernia. chronic inflammation, no H.pylori  . ESOPHAGOGASTRODUODENOSCOPY N/A 06/24/2016   Procedure: ESOPHAGOGASTRODUODENOSCOPY (EGD);  Surgeon: Corbin Ade, MD;  Location: [facility] ENDO SUITE;  Service: Endoscopy;  Laterality: N/A;  . ICD  03/28/2012  . IMPLANTABLE CARDIOVERTER DEFIBRILLATOR IMPLANT N/A 03/28/2012   Procedure: IMPLANTABLE CARDIOVERTER DEFIBRILLATOR IMPLANT;  Surgeon: Marinus Maw, MD;  Location: [facility] CATH LAB;  Service: Cardiovascular;  Laterality: N/A;  . KNEE ARTHROSCOPY  ~ 2008   right  . LEFT AND RIGHT HEART CATHETERIZATION WITH CORONARY ANGIOGRAM N/A 10/12/2011   Procedure: LEFT AND RIGHT HEART CATHETERIZATION WITH CORONARY ANGIOGRAM;  Surgeon: Iran Ouch, MD;  Location: [facility] CATH LAB;  Service: Cardiovascular;  Laterality: N/A;  . MITRAL VALVE REPAIR  10/17/2011   Procedure: MITRAL VALVE REPAIR (MVR);  Surgeon: Purcell Nails, MD;  Location: [facility] OR;  Service: Open Heart Surgery;  Laterality: N/A;  . NASAL SINUS SURGERY  1990's   right    Current Outpatient Rx  . Order #: [id number] Class: Sample  . Order #: [id number] Class: Historical Med  . Order #: [id number] Class: Normal  . Order #: [id number] Class: Historical Med  . Order #: [id number] Class: Historical Med  . Order #: [id number] Class: Historical Med  . Order #: [id number] Class: Historical Med  . Order #: [id number] Class: Normal  . Order #: [id number] Class: Historical Med  . Order #: [id number] Class: Historical Med  . Order #: [id number] Class: Historical Med  . Order #: [id number] Class: Historical Med    Allergies Ace inhibitors and Penicillins  Family History  Problem Relation Age of Onset  .  Heart disease Mother   . Diabetes Father   . Cardiomyopathy Father   . Colon cancer Neg Hx     Social History Social History   Tobacco Use  . Smoking status: Never Smoker  . Smokeless tobacco: Never Used  Substance Use Topics  . Alcohol use: No    Alcohol/week: 0.0 oz  . Drug use: No    Review of Systems  All other systems negative except as documented in the HPI. All pertinent positives and negatives as reviewed in the HPI. ____________________________________________   PHYSICAL EXAM:  VITAL SIGNS: ED Triage Vitals  Enc Vitals Group     BP 09/17/17 0955 (!) 96/48     Pulse Rate 09/17/17 0955 (!) 55     Resp 09/17/17 0957 18     Temp 09/17/17 0957 (!) 97.4 F (36.3 C)     Temp Source 09/17/17 0957 Oral     SpO2 09/17/17 0955 92 %     Weight 09/17/17 0952 237 lb (107.5 kg)     Height 09/17/17 0952 6\' 5"  (1.956 m)     Head Circumference --      Peak Flow --      Pain Score 09/17/17 0952 4     Pain Loc --  Pain Edu? --      Excl. in GC? --     Constitutional: Alert and oriented. Well appearing and in no acute distress. Eyes: Conjunctivae are normal. PERRL. EOMI. Head: Atraumatic. Nose: No congestion/rhinnorhea. Mouth/Throat: Mucous membranes are moist.  Oropharynx non-erythematous. Neck: No stridor.  No meningeal signs.   Cardiovascular: Slightly bradycardic rate, regular rhythm. Good peripheral circulation. Grossly normal heart sounds.  Hypotensive Respiratory: Tachypneic respiratory effort.  No retractions. Lungs CTAB. Gastrointestinal: Soft and tender in right upper quadrant epigastric area. No distention.  Musculoskeletal: No lower extremity tenderness nor edema. No gross deformities of extremities. Neurologic:  Normal speech and language. No gross focal neurologic deficits are appreciated.  Skin:  Skin is warm, dry and intact. No rash noted.   ____________________________________________   LABS (all labs ordered are listed, but only abnormal  results are displayed)  Labs Reviewed  CBC WITH DIFFERENTIAL/PLATELET - Abnormal; Notable for the following components:      Result Value   WBC 28.8 (*)    Platelets 123 (*)    Neutro Abs 25.7 (*)    Monocytes Absolute 1.7 (*)    All other components within normal limits  COMPREHENSIVE METABOLIC PANEL - Abnormal; Notable for the following components:   Sodium 132 (*)    Chloride 95 (*)    Glucose, Bld 156 (*)    BUN 41 (*)    Creatinine, Ser 3.44 (*)    Calcium 8.6 (*)    Albumin 3.3 (*)    AST 382 (*)    ALT 569 (*)    Alkaline Phosphatase 184 (*)    Total Bilirubin 6.8 (*)    GFR calc non Af Amer 15 (*)    GFR calc Af Amer 18 (*)    All other components within normal limits  TROPONIN I - Abnormal; Notable for the following components:   Troponin I 0.06 (*)    All other components within normal limits  LACTIC ACID, PLASMA - Abnormal; Notable for the following components:   Lactic Acid, Venous 2.3 (*)    All other components within normal limits  CULTURE, BLOOD (ROUTINE X 2)  CULTURE, BLOOD (ROUTINE X 2)  LACTIC ACID, PLASMA  LIPASE, BLOOD  URINALYSIS, ROUTINE W REFLEX MICROSCOPIC   ____________________________________________  EKG   EKG Interpretation  Date/Time:  Sunday September 17 2017 09:58:39 EST Ventricular Rate:  55 PR Interval:    QRS Duration: 213 QT Interval:  513 QTC Calculation: 491 R Axis:   -78 Text Interpretation:  Ventricular-paced rhythm No further analysis attempted due to paced rhythm No significant change since last tracing Confirmed by Marily Memos [id number]) on 09/17/2017 12:09:06 PM       ____________________________________________  RADIOLOGY  Ct Abdomen Pelvis Wo Contrast  Result Date: 09/17/2017 CLINICAL DATA:  84 year old male with abdominal pain, nausea vomiting this morning. Difficulty urinating, dark colored urine. EXAM: CT ABDOMEN AND PELVIS WITHOUT CONTRAST TECHNIQUE: Multidetector CT imaging of the abdomen and pelvis was  performed following the standard protocol without IV contrast. COMPARISON:  CT Abdomen and Pelvis 02/03/2014. FINDINGS: Lower chest: Cardiomegaly. Calcified coronary artery atherosclerosis. Prior sternotomy. No pericardial effusion. Chronic pulmonary architectural distortion in the right lower lobe. Superimposed chronic elevation of the right hemidiaphragm, with right middle and lower lobe atelectasis. Superimposed other mild dependent pulmonary atelectasis. No pleural effusion. Hepatobiliary: Cholelithiasis (series 2, image 12). No pericholecystic inflammation. Negative visible noncontrast CT. Pancreas: Chronic pancreatic atrophy. Spleen: Negative. Adrenals/Urinary Tract: Normal adrenal glands. Chronic bilateral perinephric stranding which  is nonspecific. No hydronephrosis. Both ureters are decompressed. No urologic calculus identified. Decompressed and unremarkable urinary bladder. No perivesical stranding. Stomach/Bowel: Retained low-density stool in the rectum and distal sigmoid colon. Chronic sigmoid redundancy with severe diverticulosis throughout the mid and proximal sigmoid. Difficult to exclude mild inflammation at the junction of the descending and sigmoid colon (coronal image 47), although this is similar appearance to the 2015 CT. Severe diverticulosis continues in the left colon and throughout the transverse colon. No active inflammation. Oral contrast has reached the cecum which is on a lax mesentery in the anterior right abdomen. Negative cecum and appendix. Negative terminal ileum. No dilated small bowel. Moderately distended stomach with oral contrast. Negative duodenum. No abdominal free fluid or free air. Vascular/Lymphatic: Extensive Aortoiliac calcified atherosclerosis. Vascular patency is not evaluated in the absence of IV contrast. No lymphadenopathy. Reproductive: Negative. Other: No pelvic free fluid. Musculoskeletal: Advanced lumbar spine degeneration. Chronic lumbar spinal stenosis. No  acute osseous abnormality identified. IMPRESSION: 1. Extensive large bowel diverticulosis from the hepatic flexure to the sigmoid. Difficult to exclude mild acute diverticulitis at the junction of the descending and sigmoid colon in the left lower quadrant, but the appearance is stable to a 2015 CT. 2. Cholelithiasis.  No CT evidence of acute cholecystitis. 3. No other acute or inflammatory findings identified in the abdomen or pelvis. Normal appendix. 4. Aortic Atherosclerosis (ICD10-I70.0). Electronically Signed   By: Odessa Fleming M.D.   On: 09/17/2017 14:51   Dg Chest 2 View  Result Date: 09/17/2017 CLINICAL DATA:  84 year old male with history respiratory distress. Evaluate for pneumonia. EXAM: CHEST  2 VIEW COMPARISON:  Chest x-ray 07/10/2015. FINDINGS: Lung volumes are extremely low. Chronic elevation of the right hemidiaphragm. Bibasilar opacities are slightly to represent areas of subsegmental atelectasis. No definite consolidative airspace disease. No pleural effusions. No evidence of pulmonary edema. Heart size is normal. Upper mediastinal contours are distorted by patient positioning. Aortic atherosclerosis. Status post median sternotomy for CABG mitral annuloplasty. Left-sided pacemaker/AICD device in place with lead tips projecting over the expected location of the right ventricular apex. IMPRESSION: 1. Low lung volumes with probable bibasilar subsegmental atelectasis. No radiographic evidence of acute cardiopulmonary disease. 2. Chronic elevation of the right hemidiaphragm is unchanged. 3. Aortic atherosclerosis. Electronically Signed   By: Trudie Reed M.D.   On: 09/17/2017 14:41   US Abdomen Limited Ruq  Result Date: 09/17/2017 CLINICAL DATA:  Nausea and vomiting with upper abdominal pain EXAM: ULTRASOUND ABDOMEN LIMITED RIGHT UPPER QUADRANT COMPARISON:  Abdominal ultrasound Jan 12, 2017 FINDINGS: Gallbladder: Within the gallbladder, there are echogenic foci which shadow but do not  convincingly move consistent with adherent gallstones. Largest apparent gallstone measures 10 mm. Gallbladder appears contracted. No gallbladder wall edema or pericholecystic fluid. No sonographic Murphy sign noted by sonographer. Common bile duct: Diameter: 4 mm. No intrahepatic or extrahepatic biliary duct dilatation. Liver: No focal lesion identified. Within normal limits in parenchymal echogenicity. Portal vein is patent on color Doppler imaging with normal direction of blood flow towards the liver. IMPRESSION: Cholelithiasis with gallbladder somewhat contracted. No gallbladder wall edema or pericholecystic fluid. It may be prudent in this circumstance to consider nuclear medicine hepatobiliary imaging study to assess for cystic duct patency. Study otherwise unremarkable. Electronically Signed   By: Bretta Bang III M.D.   On: 09/17/2017 13:04    ____________________________________________   PROCEDURES  Procedure(s) performed:   Procedures  CRITICAL CARE Performed by: Marily Memos Total critical care time: 45 minutes Critical care time  was exclusive of separately billable procedures and treating other patients. Critical care was necessary to treat or prevent imminent or life-threatening deterioration. Critical care was time spent personally by me on the following activities: development of treatment plan with patient and/or surrogate as well as nursing, discussions with consultants, evaluation of patient's response to treatment, examination of patient, obtaining history from patient or surrogate, ordering and performing treatments and interventions, ordering and review of laboratory studies, ordering and review of radiographic studies, pulse oximetry and re-evaluation of patient's condition.  ____________________________________________   INITIAL IMPRESSION / ASSESSMENT AND PLAN / ED COURSE Possible etiologies for the cause of his shock which is likely causing his weakness.  We will  start with fluids even though he has a history of cardiomyopathy have concern for infection as he likely had Reiger's yesterday which worries me for bacteremia.  His white blood cell count is back at this point at 28.8 concerning for sepsis so code sepsis will be called and on specified infection antibiotics will be given.  Lactic acid added on blood cultures added on.  We will continue re-evaluations and do CT scan of abdomen evaluate for any intra-abdominal infections. [place] with stones but no other abnormalities. Lactic acid improved. BP improved. Doubt severe sepsis or septic shock at this time as lactic likely complicated by liver abnormalities as well. Will hold on giving full 30 cc/kg of fluids as boluses and slowly give fluids with his history of heart failure. Will consult surgery regarding the gall bladder for further recommendation.  Surgery recommended GI consultation and spoke with Dr. Dionicia Abler who will see the patient in the morning but no need for emergent intervention at this time and will need at least a couple days off of Eliquis prior to ERCP.  Patient cannot get MRCP secondary to his pacer.  Plan for antibiotics and fluids at this time I discussed the hospitalist who will admit for the same. Family updated.   Pertinent labs & imaging results that were available during my care of the patient were reviewed by me and considered in my medical decision making (see chart for details).  ____________________________________________  FINAL CLINICAL IMPRESSION(S) / ED DIAGNOSES  Final diagnoses:  Abdominal pain  Cholangitis  Sepsis, due to unspecified organism [facility])     MEDICATIONS GIVEN DURING THIS VISIT:  Medications  sodium chloride 0.9 % bolus 1,000 mL (1,000 mLs Intravenous New Bag/Given 09/17/17 1434)  sodium chloride 0.9 % bolus 2,000 mL (0 mLs Intravenous Stopped 09/17/17 1255)  ceFEPIme (MAXIPIME) 2 g in dextrose 5 % 50 mL IVPB (0 g Intravenous Stopped 09/17/17 1232)  vancomycin  (VANCOCIN) IVPB 1000 mg/200 mL premix (0 mg Intravenous Stopped 09/17/17 1435)  fentaNYL (SUBLIMAZE) injection 50 mcg (50 mcg Intravenous Given 09/17/17 1153)     NEW OUTPATIENT MEDICATIONS STARTED DURING THIS VISIT:  New Prescriptions   No medications on file    Note:  This note was prepared with assistance of Dragon voice recognition software. Occasional wrong-word or sound-a-like substitutions may have occurred due to the inherent limitations of voice recognition software.   Marily Memos, MD 09/17/17 1542    Tejasvi Brissett, Barbara Cower, MD 10/04/17 1145

## 2017-09-17 NOTE — ED Notes (Signed)
Pt reminded of need for urine sample. Pt states he still cannot provide one at this time

## 2017-09-17 NOTE — ED Notes (Signed)
Pt transported to CT ?

## 2017-09-17 NOTE — ED Notes (Signed)
Pt informed of need for urine sample and states he will provide one as soon as able to do so.

## 2017-09-17 NOTE — ED Notes (Signed)
Elink notified this RN of fluid requirements.  MD made aware, no new orders at this time.

## 2017-09-17 NOTE — ED Notes (Signed)
Pt c/o HA. EDP notified.  

## 2017-09-17 NOTE — ED Notes (Signed)
CRITICAL VALUE ALERT  Critical Value:  Lactic Acid 2.3  Date & Time Notied:  09/17/17 @ 1047  Provider Notified: Dr Clayborne Dana  Orders Received/Actions taken: see orders

## 2017-09-17 NOTE — ED Notes (Signed)
Pt transported to US

## 2017-09-17 NOTE — ED Notes (Signed)
CRITICAL VALUE ALERT  Critical Value:  Troponin 0.06  Date & Time Notied:  09/17/17 @ 1047  Provider Notified: Dr Clayborne Dana  Orders Received/Actions taken: see new orders

## 2017-09-17 NOTE — H&P (Signed)
History and Physical  Wayne Gordon ZOX:[id number] DOB: 1934/08/14 DOA: 09/17/2017  Referring physician: Erin Hearing PCP: Samuel Jester, DO   Chief Complaint: weakness  HPI: Wayne Gordon is a 84 y.o. male with CHF cardiomyopathy, Atrial fibrillation on apixaban, CAD, DM, HTN, chronic cholelithiasis presents to ED with complaints of vomiting for last 24 hours prior to arrival.  He also reports RUQ and epigastric pain that has been intermittent and severe 8/10.  He also has nausea.  He is not vomiting.  He has had progressive symptoms of weakness, fever and chills over past couple of days.  He has positive symptoms of rigors.  Eating seems to worsen abdominal pain and nausea.  He has poor appetite. He has had poor urine output worsening in last 24 hours. He has only really been able to keep down liquids in past 12 hours.    ED Course: Pt was toxic appearing on presentation.  He was hypotensive. Afebrile. He was given IVF boluses with improvement in vitals.  His WBC was elevated at 28.  His liver enzymes were elevated.  He had an abd [place] with signs of cholelithiasis.  CT pending.  His lactate was elevated on arrival >2.5.  GI was consulted. He was started on IV antibiotics, fluids and admission was requested.   Review of Systems: All systems reviewed and apart from history of presenting illness, are negative.  Past Medical History:  Diagnosis Date  . Allergy to ACE inhibitors    Angioedema many years ago; patient has tolerated Losartan (ARB) in the past - it was held during [facility] for GI bleed and worsening renal function >> resume Losartan 25 mg QD 06/2015  . Arthritis   . Atrial fibrillation (HCC)    PAF, CHADs2Vasc = 5  . Carotid artery disease (HCC)    60-79% LICA  . CHF (congestive heart failure) (HCC)   . Coronary artery disease   . Diabetes mellitus   . GERD (gastroesophageal reflux disease)   . Hyperlipidemia   . Hypertension   . ICD (implantable cardiac defibrillator) in place  03/28/2012   [place], Dr. Ladona Ridgel 03/28/12  . Ischemic cardiomyopathy    S/P CABG x 3; EF 25%  . Mitral regurgitation    S/P mitral valve repair 2013  . Myocardial infarction (HCC)   . Renal insufficiency    Past Surgical History:  Procedure Laterality Date  . COLONOSCOPY N/A 04/24/2014   RMR Pancolonic diverticulosis  . CORONARY ARTERY BYPASS GRAFT  10/17/2011   Procedure: CORONARY ARTERY BYPASS GRAFTING (CABG);  Surgeon: Purcell Nails, MD;  Location: [facility] OR;  Service: Open Heart Surgery;  Laterality: N/A;  . ESOPHAGOGASTRODUODENOSCOPY N/A 04/24/2014   RMR Subtle nodularity the gastric mucosa of uncertain significance-status post biopsy. Hiatal hernia. chronic inflammation, no H.pylori  . ESOPHAGOGASTRODUODENOSCOPY N/A 06/24/2016   Procedure: ESOPHAGOGASTRODUODENOSCOPY (EGD);  Surgeon: Corbin Ade, MD;  Location: [facility] ENDO SUITE;  Service: Endoscopy;  Laterality: N/A;  . ICD  03/28/2012  . IMPLANTABLE CARDIOVERTER DEFIBRILLATOR IMPLANT N/A 03/28/2012   Procedure: IMPLANTABLE CARDIOVERTER DEFIBRILLATOR IMPLANT;  Surgeon: Marinus Maw, MD;  Location: [facility] CATH LAB;  Service: Cardiovascular;  Laterality: N/A;  . KNEE ARTHROSCOPY  ~ 2008   right  . LEFT AND RIGHT HEART CATHETERIZATION WITH CORONARY ANGIOGRAM N/A 10/12/2011   Procedure: LEFT AND RIGHT HEART CATHETERIZATION WITH CORONARY ANGIOGRAM;  Surgeon: Iran Ouch, MD;  Location: [facility] CATH LAB;  Service: Cardiovascular;  Laterality: N/A;  . MITRAL VALVE REPAIR  10/17/2011  Procedure: MITRAL VALVE REPAIR (MVR);  Surgeon: Purcell Nails, MD;  Location: [facility] OR;  Service: Open Heart Surgery;  Laterality: N/A;  . NASAL SINUS SURGERY  1990's   right   Social History:  reports that  has never smoked. he has never used smokeless tobacco. He reports that he does not drink alcohol or use drugs.  Allergies  Allergen Reactions  . Ace Inhibitors Swelling and Other (See Comments)    Kidney issues; angioedema per PMH in chart  . Penicillins  Palpitations        Family History  Problem Relation Age of Onset  . Heart disease Mother   . Diabetes Father   . Cardiomyopathy Father   . Colon cancer Neg Hx     Prior to Admission medications   Medication Sig Start Date End Date Taking? Authorizing Provider  apixaban (ELIQUIS) 5 MG TABS tablet Take 1 tablet (5 mg total) 2 (two) times daily by mouth. 07/11/17  Yes Marinus Maw, MD  CALCIUM-MAGNESIUM-ZINC PO Take 2 tablets by mouth daily.   Yes [provider]  carvedilol (COREG) 6.25 MG tablet Take 1 tablet (6.25 mg total) by mouth 2 (two) times daily. Patient taking differently: Take 12.5 mg by mouth 2 (two) times daily.  12/30/16  Yes Marinus Maw, MD  furosemide (LASIX) 20 MG tablet Take 20 mg tablet by month (1 tablet) alternating with 40 mg (2 tablets) every other day. 05/23/16  Yes Leone Brand, NP  glipiZIDE (GLUCOTROL XL) 10 MG 24 hr tablet Take 10 mg by mouth 2 times daily at 12 noon and 4 pm. Pt takes it in the AM and PM   Yes [provider]  hydrOXYzine (ATARAX/VISTARIL) 25 MG tablet Take 1 tablet by mouth 3 (three) times daily. 09/09/17  Yes [provider]  Insulin Glargine (TOUJEO SOLOSTAR) 300 UNIT/ML SOPN Inject 12-15 Units as directed daily.  04/27/15  Yes [provider]  losartan (COZAAR) 25 MG tablet Take 1 tablet (25 mg total) by mouth daily. 07/11/15  Yes Weaver, Scott T, PA-C  Multiple Vitamins-Minerals (MULTIVITAMINS THER. W/MINERALS) TABS Take 1 tablet by mouth daily.   Yes [provider]  pantoprazole (PROTONIX) 40 MG tablet Take 1 tablet by mouth daily. 01/03/17  Yes [provider]  rosuvastatin (CRESTOR) 20 MG tablet Take 20 mg by mouth every evening.    Yes [provider]  testosterone cypionate (DEPO-TESTOSTERONE) 200 MG/ML injection Inject 0.5 mg into the muscle every 21 ( twenty-one) days. INJECT 0.6ML- 0.8ML EVERY 2 WEEKS 08/13/14  Yes [provider]   Physical Exam: Vitals:     09/17/17 1330 09/17/17 1430 09/17/17 1500 09/17/17 1539  BP: (!) 103/54 (!) 107/55 (!) 108/59 (!) 114/58  Pulse: (!) 55 (!) 55 (!) 55 60  Resp: 20 16 19 18   Temp:      TempSrc:      SpO2: 94% 96% 95% 92%  Weight:      Height:         General exam: Moderately built and nourished patient, lying comfortably supine on the gurney in no obvious distress.  Head, eyes and ENT: Nontraumatic and normocephalic. Pupils equally reacting to light and accommodation. Oral mucosa moist.  Neck: Supple. No JVD, carotid bruit or thyromegaly.  Lymphatics: No lymphadenopathy.  Respiratory system: Clear to auscultation. No increased work of breathing.  Cardiovascular system: S1 and S2 heard, RRR. No JVD, murmurs, gallops, clicks or pedal edema.  Gastrointestinal system: Abdomen is nondistended,  soft, tenderness RUQ, no guarding. Normal bowel sounds heard. No organomegaly or masses appreciated.  Central nervous system: Alert and oriented. No focal neurological deficits.  Extremities: Symmetric 5 x 5 power. Peripheral pulses symmetrically felt.   Skin: No rashes or acute findings.  Musculoskeletal system: Negative exam.  Psychiatry: Pleasant and cooperative.  Labs on Admission:  Basic Metabolic Panel: Recent Labs  Lab 09/17/17 1002  NA 132*  K 4.6  CL 95*  CO2 23  GLUCOSE 156*  BUN 41*  CREATININE 3.44*  CALCIUM 8.6*   Liver Function Tests: Recent Labs  Lab 09/17/17 1002  AST 382*  ALT 569*  ALKPHOS 184*  BILITOT 6.8*  PROT 6.6  ALBUMIN 3.3*   Recent Labs  Lab 09/17/17 1006  LIPASE 23   No results for input(s): AMMONIA in the last 168 hours. CBC: Recent Labs  Lab 09/17/17 1002  WBC 28.8*  NEUTROABS 25.7*  HGB 15.1  HCT 44.9  MCV 94.3  PLT 123*   Cardiac Enzymes: Recent Labs  Lab 09/17/17 1002  TROPONINI 0.06*    BNP (last 3 results) No results for input(s): PROBNP in the last 8760 hours. CBG: No results for input(s): GLUCAP in the last 168  hours.  Radiological Exams on Admission: Ct Abdomen Pelvis Wo Contrast  Result Date: 09/17/2017 CLINICAL DATA:  84 year old male with abdominal pain, nausea vomiting this morning. Difficulty urinating, dark colored urine. EXAM: CT ABDOMEN AND PELVIS WITHOUT CONTRAST TECHNIQUE: Multidetector CT imaging of the abdomen and pelvis was performed following the standard protocol without IV contrast. COMPARISON:  CT Abdomen and Pelvis 02/03/2014. FINDINGS: Lower chest: Cardiomegaly. Calcified coronary artery atherosclerosis. Prior sternotomy. No pericardial effusion. Chronic pulmonary architectural distortion in the right lower lobe. Superimposed chronic elevation of the right hemidiaphragm, with right middle and lower lobe atelectasis. Superimposed other mild dependent pulmonary atelectasis. No pleural effusion. Hepatobiliary: Cholelithiasis (series 2, image 12). No pericholecystic inflammation. Negative visible noncontrast CT. Pancreas: Chronic pancreatic atrophy. Spleen: Negative. Adrenals/Urinary Tract: Normal adrenal glands. Chronic bilateral perinephric stranding which is nonspecific. No hydronephrosis. Both ureters are decompressed. No urologic calculus identified. Decompressed and unremarkable urinary bladder. No perivesical stranding. Stomach/Bowel: Retained low-density stool in the rectum and distal sigmoid colon. Chronic sigmoid redundancy with severe diverticulosis throughout the mid and proximal sigmoid. Difficult to exclude mild inflammation at the junction of the descending and sigmoid colon (coronal image 47), although this is similar appearance to the 2015 CT. Severe diverticulosis continues in the left colon and throughout the transverse colon. No active inflammation. Oral contrast has reached the cecum which is on a lax mesentery in the anterior right abdomen. Negative cecum and appendix. Negative terminal ileum. No dilated small bowel. Moderately distended stomach with oral contrast. Negative  duodenum. No abdominal free fluid or free air. Vascular/Lymphatic: Extensive Aortoiliac calcified atherosclerosis. Vascular patency is not evaluated in the absence of IV contrast. No lymphadenopathy. Reproductive: Negative. Other: No pelvic free fluid. Musculoskeletal: Advanced lumbar spine degeneration. Chronic lumbar spinal stenosis. No acute osseous abnormality identified. IMPRESSION: 1. Extensive large bowel diverticulosis from the hepatic flexure to the sigmoid. Difficult to exclude mild acute diverticulitis at the junction of the descending and sigmoid colon in the left lower quadrant, but the appearance is stable to a 2015 CT. 2. Cholelithiasis.  No CT evidence of acute cholecystitis. 3. No other acute or inflammatory findings identified in the abdomen or pelvis. Normal appendix. 4. Aortic Atherosclerosis (ICD10-I70.0). Electronically Signed   By: Odessa Fleming M.D.   On: 09/17/2017 14:51  Dg Chest 2 View  Result Date: 09/17/2017 CLINICAL DATA:  84 year old male with history respiratory distress. Evaluate for pneumonia. EXAM: CHEST  2 VIEW COMPARISON:  Chest x-ray 07/10/2015. FINDINGS: Lung volumes are extremely low. Chronic elevation of the right hemidiaphragm. Bibasilar opacities are slightly to represent areas of subsegmental atelectasis. No definite consolidative airspace disease. No pleural effusions. No evidence of pulmonary edema. Heart size is normal. Upper mediastinal contours are distorted by patient positioning. Aortic atherosclerosis. Status post median sternotomy for CABG mitral annuloplasty. Left-sided pacemaker/AICD device in place with lead tips projecting over the expected location of the right ventricular apex. IMPRESSION: 1. Low lung volumes with probable bibasilar subsegmental atelectasis. No radiographic evidence of acute cardiopulmonary disease. 2. Chronic elevation of the right hemidiaphragm is unchanged. 3. Aortic atherosclerosis. Electronically Signed   By: Trudie Reed M.D.   On:  09/17/2017 14:41   US Abdomen Limited Ruq  Result Date: 09/17/2017 CLINICAL DATA:  Nausea and vomiting with upper abdominal pain EXAM: ULTRASOUND ABDOMEN LIMITED RIGHT UPPER QUADRANT COMPARISON:  Abdominal ultrasound Jan 12, 2017 FINDINGS: Gallbladder: Within the gallbladder, there are echogenic foci which shadow but do not convincingly move consistent with adherent gallstones. Largest apparent gallstone measures 10 mm. Gallbladder appears contracted. No gallbladder wall edema or pericholecystic fluid. No sonographic Murphy sign noted by sonographer. Common bile duct: Diameter: 4 mm. No intrahepatic or extrahepatic biliary duct dilatation. Liver: No focal lesion identified. Within normal limits in parenchymal echogenicity. Portal vein is patent on color Doppler imaging with normal direction of blood flow towards the liver. IMPRESSION: Cholelithiasis with gallbladder somewhat contracted. No gallbladder wall edema or pericholecystic fluid. It may be prudent in this circumstance to consider nuclear medicine hepatobiliary imaging study to assess for cystic duct patency. Study otherwise unremarkable. Electronically Signed   By: Bretta Bang III M.D.   On: 09/17/2017 13:04   EKG: Independently reviewed. Paced rhythm.  Assessment/Plan Active Problems:   Cholangitis   Leukocytosis   Sepsis (HCC)   Hypotension   Lactic acidosis   Elevated troponin   S/P mitral valve repair   S/P CABG x 3   Long term (current) use of anticoagulants   Chronic systolic heart failure (HCC)   Ischemic cardiomyopathy   CKD (chronic kidney disease) stage 3, GFR 30-59 ml/min (HCC)   Current use of long term anticoagulation   HTN (hypertension)   Automatic implantable cardioverter-defibrillator in situ   HLD (hyperlipidemia)   GERD (gastroesophageal reflux disease)   Abdominal pain, epigastric   Body mass index (BMI) of 28.0-28.9 in adult   Diabetes (HCC)   Gonadotropin deficiency (HCC)   Diabetes mellitus, type 2  (HCC)   Atrial fibrillation (HCC)   ICD (implantable cardioverter-defibrillator) discharge   Carotid artery disease (HCC)   Cholelithiasis   Elevated liver enzymes   Acute renal failure superimposed on stage 3 chronic kidney disease (HCC)  1. Sepsis - with fever and rigors, secondary to intra-abdominal infection, follow blood cultures, continue IVF hydration, lactate trending down, continue broad spectrum antibiotics.  Monitor on telemetry.  2. Leukocytosis - secondary to cholangitis - continue broad spectrum antibiotics, follow WBC with serial CBC testing.  3. Cholangitis - unable to perform MRCP because of ICD, GI consulted for eRCP consideration. Continue broad spectrum antibiotics and supportive care.  4. Elevated LFTs - suspect secondary to cholangitis, follow and trend.  5. Bradycardia - HR in the 50s, will reduce dose of coreg by 50%.  Follow clinically.   6. Chronic systolic CHF - gently  hydrate and follow clinically.  Hold lasix temporarily while re-hydrating.  7. Type 2 DM - carb modified diet, sliding scale coverage, check A1c. Resume basal insulin. Regular glucose testing.  8. Cholelithiasis - GI consult for ERCP consideration.  Holding eliquis for now.   9. Abdominal pain RUQ - IV pain and nausea medications ordered.  10. Acute on chronic CKD stage 3 - slight worsening with sepsis and dehydration, IVF hydration ordered, renally dose meds as appropriate, holding losartan for now.  11. Dyslipidemia - Hold crestor temporarily due to acute elevation in liver enzymes.  12. GERD - continue pantoprazole for GI protection.    DVT Prophylaxis: SCDs Code Status: Full  Family Communication: wife/daughter bedside   Disposition Plan: TBD Time spent: 58 mins  Standley Dakins, MD [facility] Pager [phone]  If 7PM-7AM, please contact night-coverage [email or web address] Password [facility] 09/17/2017, 3:45 PM

## 2017-09-17 NOTE — ED Notes (Signed)
EDP at bedside updating patient and family. 

## 2017-09-17 NOTE — Progress Notes (Signed)
Pharmacy Antibiotic Note  Wayne Gordon is a 84 y.o. male admitted on 09/17/2017 with sepsis.  Pharmacy has been consulted for cefepime dosing. Initial dose of 2gm given in ED  Plan: Continue cefepime 1gm IV q24 hours F/u renal function, cultures and clinical course  Height: 6\' 5"  (195.6 cm) Weight: 237 lb (107.5 kg) IBW/kg (Calculated) : 89.1  Temp (24hrs), Avg:97.4 F (36.3 C), Min:97.4 F (36.3 C), Max:97.4 F (36.3 C)  Recent Labs  Lab 09/17/17 1002 09/17/17 1003 09/17/17 1130  WBC 28.8*  --   --   CREATININE 3.44*  --   --   LATICACIDVEN  --  2.3* 1.9    Estimated Creatinine Clearance: 23 mL/min (A) (by C-G formula based on SCr of 3.44 mg/dL (H)).     Antimicrobials this admission: vanc  1/20 for 1 dose cefepime 1/20 >>   Thank you for allowing pharmacy to be a part of this patient's care.  Talbert Cage [place] 09/17/2017 2:58 PM

## 2017-09-17 NOTE — ED Triage Notes (Addendum)
EMS reports pt has had some urinary retention with nausea and vomiting x1 this am. PT states he has only been able to pass a small amount of dark colored urine this am. PT also c/o upper abdominal discomfort and generalized weakness for the past few days.

## 2017-09-18 ENCOUNTER — Encounter (HOSPITAL_COMMUNITY): Payer: Self-pay | Admitting: Gastroenterology

## 2017-09-18 DIAGNOSIS — K8309 Other cholangitis: Secondary | ICD-10-CM

## 2017-09-18 DIAGNOSIS — K8071 Calculus of gallbladder and bile duct without cholecystitis with obstruction: Secondary | ICD-10-CM

## 2017-09-18 LAB — COMPREHENSIVE METABOLIC PANEL
ALT: 341 U/L — ABNORMAL HIGH (ref 17–63)
AST: 161 U/L — AB (ref 15–41)
Albumin: 2.8 g/dL — ABNORMAL LOW (ref 3.5–5.0)
Alkaline Phosphatase: 135 U/L — ABNORMAL HIGH (ref 38–126)
Anion gap: 7 (ref 5–15)
BILIRUBIN TOTAL: 3.6 mg/dL — AB (ref 0.3–1.2)
BUN: 50 mg/dL — AB (ref 6–20)
CO2: 24 mmol/L (ref 22–32)
CREATININE: 3.35 mg/dL — AB (ref 0.61–1.24)
Calcium: 7.5 mg/dL — ABNORMAL LOW (ref 8.9–10.3)
Chloride: 98 mmol/L — ABNORMAL LOW (ref 101–111)
GFR calc Af Amer: 18 mL/min — ABNORMAL LOW (ref >60)
GFR, EST NON AFRICAN AMERICAN: 16 mL/min — AB (ref >60)
Glucose, Bld: 120 mg/dL — ABNORMAL HIGH (ref 65–99)
POTASSIUM: 4.1 mmol/L (ref 3.5–5.1)
Sodium: 129 mmol/L — ABNORMAL LOW (ref 135–145)
Total Protein: 5.6 g/dL — ABNORMAL LOW (ref 6.5–8.1)

## 2017-09-18 LAB — CBC WITH DIFFERENTIAL/PLATELET
BASOS ABS: 0 10*3/uL (ref 0.0–0.1)
Basophils Relative: 0 %
Eosinophils Absolute: 0 10*3/uL (ref 0.0–0.7)
Eosinophils Relative: 0 %
HEMATOCRIT: 39.6 % (ref 39.0–52.0)
HEMOGLOBIN: 13.5 g/dL (ref 13.0–17.0)
LYMPHS ABS: 1.5 10*3/uL (ref 0.7–4.0)
Lymphocytes Relative: 8 %
MCH: 31.5 pg (ref 26.0–34.0)
MCHC: 34.1 g/dL (ref 30.0–36.0)
MCV: 92.3 fL (ref 78.0–100.0)
MONO ABS: 1.1 10*3/uL — AB (ref 0.1–1.0)
MONOS PCT: 6 %
Neutro Abs: 16.1 10*3/uL — ABNORMAL HIGH (ref 1.7–7.7)
Neutrophils Relative %: 86 %
Platelets: 107 10*3/uL — ABNORMAL LOW (ref 150–400)
RBC: 4.29 MIL/uL (ref 4.22–5.81)
RDW: 13.7 % (ref 11.5–15.5)
WBC: 18.7 10*3/uL — AB (ref 4.0–10.5)

## 2017-09-18 LAB — GLUCOSE, CAPILLARY
Glucose-Capillary: 91 mg/dL (ref 65–99)
Glucose-Capillary: 147 mg/dL — ABNORMAL HIGH (ref 65–99)
Glucose-Capillary: 132 mg/dL — ABNORMAL HIGH (ref 65–99)
Glucose-Capillary: 149 mg/dL — ABNORMAL HIGH (ref 65–99)

## 2017-09-18 LAB — TROPONIN I
TROPONIN I: 0.04 ng/mL — AB (ref <0.03)
TROPONIN I: 0.04 ng/mL — AB (ref <0.03)

## 2017-09-18 LAB — APTT: aPTT: 37 seconds — ABNORMAL HIGH (ref 24–36)

## 2017-09-18 LAB — PROTIME-INR
INR: 1.86
Prothrombin Time: 21.3 seconds — ABNORMAL HIGH (ref 11.4–15.2)

## 2017-09-18 LAB — HEMOGLOBIN A1C
HEMOGLOBIN A1C: 8 % — AB (ref 4.8–5.6)
MEAN PLASMA GLUCOSE: 182.9 mg/dL

## 2017-09-18 LAB — MAGNESIUM: Magnesium: 1.6 mg/dL — ABNORMAL LOW (ref 1.7–2.4)

## 2017-09-18 MED ORDER — METRONIDAZOLE IN NACL 5-0.79 MG/ML-% IV SOLN
500.0000 mg | Freq: Three times a day (TID) | INTRAVENOUS | Status: DC
  Administered 2017-09-18 – 2017-09-19 (×3): 500 mg via INTRAVENOUS
  Filled 2017-09-18 (×5): qty 100

## 2017-09-18 MED ORDER — MAGNESIUM SULFATE 2 GM/50ML IV SOLN
2.0000 g | Freq: Once | INTRAVENOUS | Status: AC
  Administered 2017-09-18: 2 g via INTRAVENOUS
  Filled 2017-09-18: qty 50

## 2017-09-18 MED ORDER — GLUCERNA SHAKE PO LIQD
237.0000 mL | Freq: Three times a day (TID) | ORAL | Status: DC
Start: 2017-09-18 — End: 2017-09-22
  Administered 2017-09-18 – 2017-09-21 (×5): 237 mL via ORAL

## 2017-09-18 NOTE — Consult Note (Signed)
[facility] Consult  Reason for Consult: Cholangitis, cholelithiasis  Referring Physician:  Dr. Wynetta Emery   Chief Complaint    Weakness      Wayne Gordon is a 84 y.o. male.  HPI: Wayne Gordon is a very pleasant 84 yo with multiple medical issues including ischemic cardiomyopathy, remote MI s/p CABG, EF 25-35%, A fib on Eliquis, Complete heart block with Biotronik VDD ICD device in place that paces through the ventricle,  CKD who presented to the ED with complaints of epigastric pain and nausea, and weakness.  The patient had been having subjective fevers and chills/ rigors, and had been having dark urine.  He was found to be hypotensive in the ED with SBP 80s and leukocytosis to 28.  He also was found to have T bili to 6.8 and elevated LFTs, and a known history of cholelithiasis.    He had a CT scan that demonstrated cholelithiasis and a [place] RUQ that showed a contracted gallbladder with stones and CBD 27m.  He has been evaluated in the past for concern for gallstones, and has been seen by GI as an outpatient.  He has had melena in the past prompting EGDs that were negative (2017) with thoughts that he had a diverticular bleed from his extensive diverticulosis.    He was to undergo a HIDA scan as an outpatient but this was never scheduled due to cost issues per the notes (01/10/2017), [facility] but I cannot find documentation in the chart of this visit.  The patient reports seeing Dr. RRosendo Gordon and being told he did not need to get his gallbladder out then because he did not think the symptoms were related to the gallbladder at that time.   The patient saw his cardiologist Dr. TLovena Gordon for a recheck and integration of the Biotronik VDD ICD and reported some SOB at that time, which continues to be about the same.  He is unable to get an MRI due to this device .  Currently the patient is tolerating a clear diet  and feels that his abdominal pain is improved.   Past Medical History:  Diagnosis Date  . Allergy to ACE inhibitors    Angioedema many years ago; patient has tolerated Losartan (ARB) in the past - [facility] >> resume Losartan 25 mg QD 06/2015  . Arthritis   . Atrial fibrillation (HCC)    PAF, CHADs2Vasc = 5  . Carotid artery disease (HCC)    600-93%LICA  . [place] heart [place]   . Coronary artery disease   . Diabetes mellitus   . GERD (gastroesophageal reflux disease)   . Hyperlipidemia   . Hypertension   . ICD (implantable cardiac defibrillator) in place 03/28/2012   Biotronik, Dr. TLovena Le7/31/13  . Ischemic cardiomyopathy    S/P CABG x 3; EF 25%  . Mitral regurgitation    S/P mitral valve repair 2013  . [place]   . Renal insufficiency     Past Surgical History:  Procedure Laterality Date  . COLONOSCOPY N/A 04/24/2014   RMR Pancolonic diverticulosis  . CORONARY ARTERY BYPASS GRAFT  10/17/2011   Procedure: CORONARY ARTERY BYPASS GRAFTING (CABG);  Surgeon: CRexene Alberts MD;  [place]  Service: Open Heart Surgery;  Laterality: N/A;  . ESOPHAGOGASTRODUODENOSCOPY N/A 04/24/2014   RMR Subtle nodularity the gastric mucosa of uncertain significance-status  post biopsy. Hiatal hernia. chronic inflammation, no H.pylori  . ESOPHAGOGASTRODUODENOSCOPY N/A 06/24/2016   Dr. Gala Romney: normal esophagus, small hiatal hernia, normal duodenum  . ICD  03/28/2012  . IMPLANTABLE CARDIOVERTER DEFIBRILLATOR IMPLANT N/A 03/28/2012   Procedure: IMPLANTABLE CARDIOVERTER DEFIBRILLATOR IMPLANT;  Surgeon: Evans Lance, MD;  [facility] C[facility] LAB;  Service: Cardiovascular;  Laterality: N/A;  . KNEE ARTHROSCOPY  ~ 2008   right  . LEFT AND RIGHT HEART CATHETERIZATION WITH CORONARY ANGIOGRAM N/A 10/12/2011   Procedure: LEFT AND RIGHT HEART CATHETERIZATION WITH CORONARY ANGIOGRAM;  Surgeon: Wellington Hampshire, MD;   Location: [place] CATH LAB;  Service: Cardiovascular;  Laterality: N/A;  . MITRAL VALVE REPAIR  10/17/2011   Procedure: MITRAL VALVE REPAIR (MVR);  Surgeon: Rexene Alberts, MD;  Location: [place];  Service: Open Heart Surgery;  Laterality: N/A;  . NASAL SINUS SURGERY  1990's   right    Family History  Problem Relation Age of Onset  . Heart disease Mother   . Diabetes Father   . Cardiomyopathy Father   . Colon cancer Neg Hx     Social History   Tobacco Use  . Smoking status: Never Smoker  . Smokeless tobacco: Never Used  Substance Use Topics  . Alcohol use: No    Alcohol/week: 0.0 oz  . Drug use: No    Medications:  I have reviewed the patient's current medications. Prior to Admission:  Medications Prior to Admission  Medication Sig Dispense Refill Last Dose  . apixaban (ELIQUIS) 5 MG TABS tablet Take 1 tablet (5 mg total) 2 (two) times daily by mouth. 56 tablet 0 09/17/2017 at Unknown time  . CALCIUM-MAGNESIUM-ZINC PO Take 2 tablets by mouth daily.   09/16/2017 at Unknown time  . carvedilol (COREG) 6.25 MG tablet Take 1 tablet (6.25 mg total) by mouth 2 (two) times daily. (Patient taking differently: Take 12.5 mg by mouth 2 (two) times daily. ) 180 tablet 3 09/17/2017 at 0900  . furosemide (LASIX) 20 MG tablet Take 20 mg tablet by month (1 tablet) alternating with 40 mg (2 tablets) every other day. 90 tablet 3 09/17/2017 at Unknown time  . glipiZIDE (GLUCOTROL XL) 10 MG 24 hr tablet Take 10 mg by mouth 2 times daily at 12 noon and 4 pm. Pt takes it in the AM and PM   09/17/2017 at Unknown time  . hydrOXYzine (ATARAX/VISTARIL) 25 MG tablet Take 1 tablet by mouth 3 (three) times daily.   09/16/2017 at Unknown time  . Insulin Glargine (TOUJEO SOLOSTAR) 300 UNIT/ML SOPN Inject 12-15 Units as directed daily.    Past Week at Unknown time  . losartan (COZAAR) 25 MG tablet Take 1 tablet (25 mg total) by mouth daily. 30 tablet 11 09/16/2017 at Unknown time  . Multiple Vitamins-Minerals  (MULTIVITAMINS THER. W/MINERALS) TABS Take 1 tablet by mouth daily.   09/16/2017 at Unknown time  . pantoprazole (PROTONIX) 40 MG tablet Take 1 tablet by mouth daily.   09/17/2017 at Unknown time  . rosuvastatin (CRESTOR) 20 MG tablet Take 20 mg by mouth every evening.    09/16/2017 at Unknown time  . testosterone cypionate (DEPO-TESTOSTERONE) 200 MG/ML injection Inject 0.5 mg into the muscle every 21 ( twenty-one) days. INJECT 0.6ML- 0.8ML EVERY 2 WEEKS   09/01/2017 at Unknown time   Scheduled: . carvedilol  6.25 mg Oral BID WC  . feeding supplement (GLUCERNA SHAKE)  237 mL Oral TID BM  . insulin aspart  0-9 Units Subcutaneous TID WC  .  insulin glargine  12 Units Subcutaneous Daily  . pantoprazole  40 mg Oral Daily   Continuous: . sodium chloride 75 mL/hr at 09/18/17 0700  . ceFEPime (MAXIPIME) IV Stopped (09/18/17 1119)  . metronidazole Stopped (09/18/17 1315)   RKY:[id number] **OR** acetaminophen, HYDROcodone-acetaminophen, hydrOXYzine, morphine injection, ondansetron **OR** ondansetron (ZOFRAN) IV  Allergies: ACE inhibitors, PCN  ROS:  A comprehensive review of systems was negative except for: Cardiovascular: positive for intermittent SOB Gastrointestinal: positive for abdominal pain, nausea and epigastric/ RUQ abdominal pain Genitourinary: positive for dark urine  Blood pressure (!) 105/46, pulse (!) 55, temperature 97.7 F (36.5 C), temperature source Oral, resp. rate 18, height '6\' 5"'$  (1.956 m), weight 252 lb 10.4 oz (114.6 kg), SpO2 97 %. Physical Exam  Constitutional: He is oriented to person, place, and time and well-developed, well-nourished, and in no distress.  HENT:  Head: Normocephalic.  Eyes: Pupils are equal, round, and reactive to light.  Neck: Normal range of motion.  Cardiovascular: Bradycardia present.  Paced rate  Pulmonary/Chest: Effort normal.  Abdominal: Soft. He exhibits no distension.  Minimally tender RUQ /epigastric pain  Musculoskeletal: Normal range  of motion. He exhibits no edema.  Neurological: He is alert and oriented to person, place, and time.  Skin: Skin is warm and dry.  Psychiatric: Mood, memory, affect and judgment normal.  Vitals reviewed.   Results: Results for orders placed or performed during the hospital encounter of 09/17/17 (from the past 48 hour(s))  CBC with Differential     Status: Abnormal   Collection Time: 09/17/17 10:02 AM  Result Value Ref Range   WBC 28.8 (H) 4.0 - 10.5 K/uL   RBC 4.76 4.22 - 5.81 MIL/uL   Hemoglobin 15.1 13.0 - 17.0 g/dL   HCT 44.9 39.0 - 52.0 %   MCV 94.3 78.0 - 100.0 fL   MCH 31.7 26.0 - 34.0 pg   MCHC 33.6 30.0 - 36.0 g/dL   RDW 13.9 11.5 - 15.5 %   Platelets 123 (L) 150 - 400 K/uL   Neutrophils Relative % 89 %   Lymphocytes Relative 5 %   Monocytes Relative 6 %   Eosinophils Relative 0 %   Basophils Relative 0 %   Neutro Abs 25.7 (H) 1.7 - 7.7 K/uL   Lymphs Abs 1.4 0.7 - 4.0 K/uL   Monocytes Absolute 1.7 (H) 0.1 - 1.0 K/uL   Eosinophils Absolute 0.0 0.0 - 0.7 K/uL   Basophils Absolute 0.0 0.0 - 0.1 K/uL   WBC Morphology MILD LEFT SHIFT (1-5% METAS, OCC MYELO, OCC BANDS)     Comment: WHITE COUNT CONFIRMED ON SMEAR TOXIC GRANULATION VACUOLATED NEUTROPHILS   Comprehensive metabolic panel     Status: Abnormal   Collection Time: 09/17/17 10:02 AM  Result Value Ref Range   Sodium 132 (L) 135 - 145 mmol/L   Potassium 4.6 3.5 - 5.1 mmol/L   Chloride 95 (L) 101 - 111 mmol/L   CO2 23 22 - 32 mmol/L   Glucose, Bld 156 (H) 65 - 99 mg/dL   BUN 41 (H) 6 - 20 mg/dL   Creatinine, Ser 3.44 (H) 0.61 - 1.24 mg/dL   Calcium 8.6 (L) 8.9 - 10.3 mg/dL   Total Protein 6.6 6.5 - 8.1 g/dL   Albumin 3.3 (L) 3.5 - 5.0 g/dL   AST 382 (H) 15 - 41 U/L   ALT 569 (H) 17 - 63 U/L   Alkaline Phosphatase 184 (H) 38 - 126 U/L   Total Bilirubin 6.8 (H) 0.3 -  1.2 mg/dL   GFR calc non Af Amer 15 (L) >60 mL/min   GFR calc Af Amer 18 (L) >60 mL/min    Comment: (NOTE) The eGFR has been calculated using  the CKD EPI equation. This calculation has not been validated in all clinical situations. eGFR's persistently <60 mL/min signify possible Chronic Kidney Disease.    Anion gap 14 5 - 15  Troponin I     Status: Abnormal   Collection Time: 09/17/17 10:02 AM  Result Value Ref Range   Troponin I 0.06 (HH) <0.03 ng/mL    Comment: CRITICAL RESULT CALLED TO, READ BACK BY AND VERIFIED WITH: MARTIN,D'@1046'$  BY MATTHEWS,B 1.20.19   TSH     Status: None   Collection Time: 09/17/17 10:02 AM  Result Value Ref Range   TSH 3.293 0.350 - 4.500 uIU/mL    Comment: Performed by a 3rd Generation assay with a functional sensitivity of <=0.01 uIU/mL.  Hemoglobin A1c     Status: Abnormal   Collection Time: 09/17/17 10:02 AM  Result Value Ref Range   Hgb A1c MFr Bld 8.0 (H) 4.8 - 5.6 %    Comment: (NOTE) Pre diabetes:          5.7%-6.4% Diabetes:              >6.4% Glycemic control for   <7.0% adults with diabetes    Mean Plasma Glucose 182.9 mg/dL    Comment: Performed at [facility] [address] [place], [place] [address]  Lactic acid, plasma     Status: Abnormal   Collection Time: 09/17/17 10:03 AM  Result Value Ref Range   Lactic Acid, Venous 2.3 (HH) 0.5 - 1.9 mmol/L    Comment: CRITICAL RESULT CALLED TO, READ BACK BY AND VERIFIED WITH: MARTIN,D'@1046'$  BY MATTHEWS,B 1.20.19   Lipase, blood     Status: None   Collection Time: 09/17/17 10:06 AM  Result Value Ref Range   Lipase 23 11 - 51 U/L  Blood culture (routine x 2)     Status: None (Preliminary result)   Collection Time: 09/17/17 11:12 AM  Result Value Ref Range   Specimen Description RIGHT ANTECUBITAL    Special Requests      BOTTLES DRAWN AEROBIC AND ANAEROBIC Blood Culture adequate volume   Culture NO GROWTH < 24 HOURS    Report Status PENDING   Blood culture (routine x 2)     Status: None (Preliminary result)   Collection Time: 09/17/17 11:18 AM  Result Value Ref Range   Specimen Description BLOOD RIGHT HAND    Special  Requests      BOTTLES DRAWN AEROBIC AND ANAEROBIC Blood Culture results may not be optimal due to an inadequate volume of blood received in culture bottles   Culture NO GROWTH < 24 HOURS    Report Status PENDING   Lactic acid, plasma     Status: None   Collection Time: 09/17/17 11:30 AM  Result Value Ref Range   Lactic Acid, Venous 1.9 0.5 - 1.9 mmol/L  Glucose, capillary     Status: Abnormal   Collection Time: 09/17/17  4:48 PM  Result Value Ref Range   Glucose-Capillary 164 (H) 65 - 99 mg/dL  Troponin I     Status: Abnormal   Collection Time: 09/17/17  6:53 PM  Result Value Ref Range   Troponin I 0.04 (HH) <0.03 ng/mL    Comment: CRITICAL VALUE NOTED.  VALUE IS CONSISTENT WITH PREVIOUSLY REPORTED AND CALLED VALUE.  Glucose,  capillary     Status: Abnormal   Collection Time: 09/17/17  8:19 PM  Result Value Ref Range   Glucose-Capillary 222 (H) 65 - 99 mg/dL  Troponin I     Status: Abnormal   Collection Time: 09/18/17 12:32 AM  Result Value Ref Range   Troponin I 0.04 (HH) <0.03 ng/mL    Comment: CRITICAL VALUE NOTED.  VALUE IS CONSISTENT WITH PREVIOUSLY REPORTED AND CALLED VALUE.  Magnesium     Status: Abnormal   Collection Time: 09/18/17  5:02 AM  Result Value Ref Range   Magnesium 1.6 (L) 1.7 - 2.4 mg/dL  Comprehensive metabolic panel     Status: Abnormal   Collection Time: 09/18/17  5:02 AM  Result Value Ref Range   Sodium 129 (L) 135 - 145 mmol/L   Potassium 4.1 3.5 - 5.1 mmol/L   Chloride 98 (L) 101 - 111 mmol/L   CO2 24 22 - 32 mmol/L   Glucose, Bld 120 (H) 65 - 99 mg/dL   BUN 50 (H) 6 - 20 mg/dL   Creatinine, Ser 3.35 (H) 0.61 - 1.24 mg/dL   Calcium 7.5 (L) 8.9 - 10.3 mg/dL   Total Protein 5.6 (L) 6.5 - 8.1 g/dL   Albumin 2.8 (L) 3.5 - 5.0 g/dL   AST 161 (H) 15 - 41 U/L   ALT 341 (H) 17 - 63 U/L   Alkaline Phosphatase 135 (H) 38 - 126 U/L   Total Bilirubin 3.6 (H) 0.3 - 1.2 mg/dL   GFR calc non Af Amer 16 (L) >60 mL/min   GFR calc Af Amer 18 (L) >60 mL/min     Comment: (NOTE) The eGFR has been calculated using the CKD EPI equation. This calculation has not been validated in all clinical situations. eGFR's persistently <60 mL/min signify possible Chronic Kidney Disease.    Anion gap 7 5 - 15  CBC WITH DIFFERENTIAL     Status: Abnormal   Collection Time: 09/18/17  5:02 AM  Result Value Ref Range   WBC 18.7 (H) 4.0 - 10.5 K/uL    Comment: WHITE COUNT CONFIRMED ON SMEAR   RBC 4.29 4.22 - 5.81 MIL/uL   Hemoglobin 13.5 13.0 - 17.0 g/dL   HCT 39.6 39.0 - 52.0 %   MCV 92.3 78.0 - 100.0 fL   MCH 31.5 26.0 - 34.0 pg   MCHC 34.1 30.0 - 36.0 g/dL   RDW 13.7 11.5 - 15.5 %   Platelets 107 (L) 150 - 400 K/uL    Comment: SPECIMEN CHECKED FOR CLOTS PLATELET COUNT CONFIRMED BY SMEAR    Neutrophils Relative % 86 %   Lymphocytes Relative 8 %   Monocytes Relative 6 %   Eosinophils Relative 0 %   Basophils Relative 0 %   Neutro Abs 16.1 (H) 1.7 - 7.7 K/uL   Lymphs Abs 1.5 0.7 - 4.0 K/uL   Monocytes Absolute 1.1 (H) 0.1 - 1.0 K/uL   Eosinophils Absolute 0.0 0.0 - 0.7 K/uL   Basophils Absolute 0.0 0.0 - 0.1 K/uL   WBC Morphology MILD LEFT SHIFT (1-5% METAS, OCC MYELO, OCC BANDS)     Comment: TOXIC GRANULATION  APTT     Status: Abnormal   Collection Time: 09/18/17  5:02 AM  Result Value Ref Range   aPTT 37 (H) 24 - 36 seconds    Comment:        IF BASELINE aPTT IS ELEVATED, SUGGEST PATIENT RISK ASSESSMENT BE USED TO DETERMINE APPROPRIATE ANTICOAGULANT THERAPY.   Protime-INR  Status: Abnormal   Collection Time: 09/18/17  5:02 AM  Result Value Ref Range   Prothrombin Time 21.3 (H) 11.4 - 15.2 seconds   INR 1.86   Troponin I     Status: Abnormal   Collection Time: 09/18/17  5:02 AM  Result Value Ref Range   Troponin I 0.04 (HH) <0.03 ng/mL    Comment: CRITICAL VALUE NOTED.  VALUE IS CONSISTENT WITH PREVIOUSLY REPORTED AND CALLED VALUE.  Glucose, capillary     Status: None   Collection Time: 09/18/17  8:13 AM  Result Value Ref Range    Glucose-Capillary 91 65 - 99 mg/dL  Glucose, capillary     Status: Abnormal   Collection Time: 09/18/17 11:05 AM  Result Value Ref Range   Glucose-Capillary 147 (H) 65 - 99 mg/dL   Personally reviewed imaging- contracted gallbladder, difficult to follow CBD on CT scan Ct Abdomen Pelvis Wo Contrast  Result Date: 09/17/2017 CLINICAL DATA:  84 year old male with abdominal pain, nausea vomiting this morning. Difficulty urinating, dark colored urine. EXAM: CT ABDOMEN AND PELVIS WITHOUT CONTRAST TECHNIQUE: Multidetector CT imaging of the abdomen and pelvis was performed following the standard protocol without IV contrast. COMPARISON:  CT Abdomen and Pelvis 02/03/2014. FINDINGS: Lower chest: Cardiomegaly. Calcified coronary artery atherosclerosis. Prior sternotomy. No pericardial effusion. Chronic pulmonary architectural distortion in the right lower lobe. Superimposed chronic elevation of the right hemidiaphragm, with right middle and lower lobe atelectasis. Superimposed other mild dependent pulmonary atelectasis. No pleural effusion. Hepatobiliary: Cholelithiasis (series 2, image 12). No pericholecystic inflammation. Negative visible noncontrast CT. Pancreas: Chronic pancreatic atrophy. Spleen: Negative. Adrenals/Urinary Tract: Normal adrenal glands. Chronic bilateral perinephric stranding which is nonspecific. No hydronephrosis. Both ureters are decompressed. No urologic calculus identified. Decompressed and unremarkable urinary bladder. No perivesical stranding. Stomach/Bowel: Retained low-density stool in the rectum and distal sigmoid colon. Chronic sigmoid redundancy with severe diverticulosis throughout the mid and proximal sigmoid. Difficult to exclude mild inflammation at the junction of the descending and sigmoid colon (coronal image 47), although this is similar appearance to the 2015 CT. Severe diverticulosis continues in the left colon and throughout the transverse colon. No active inflammation. Oral  contrast has reached the cecum which is on a lax mesentery in the anterior right abdomen. Negative cecum and appendix. Negative terminal ileum. No dilated small bowel. Moderately distended stomach with oral contrast. Negative duodenum. No abdominal free fluid or free air. Vascular/Lymphatic: Extensive Aortoiliac calcified atherosclerosis. Vascular patency is not evaluated in the absence of IV contrast. No lymphadenopathy. Reproductive: Negative. Other: No pelvic free fluid. Musculoskeletal: Advanced lumbar spine degeneration. Chronic lumbar spinal stenosis. No acute osseous abnormality identified. IMPRESSION: 1. Extensive large bowel diverticulosis from the hepatic flexure to the sigmoid. Difficult to exclude mild acute diverticulitis at the junction of the descending and sigmoid colon in the left lower quadrant, but the appearance is stable to a 2015 CT. 2. Cholelithiasis.  No CT evidence of acute cholecystitis. 3. No other acute or inflammatory findings identified in the abdomen or pelvis. Normal appendix. 4. Aortic Atherosclerosis (ICD10-I70.0). Electronically Signed   By: Genevie Ann M.D.   On: 09/17/2017 14:51   Dg Chest 2 View  Result Date: 09/17/2017 CLINICAL DATA:  84 year old male with history respiratory distress. Evaluate for pneumonia. EXAM: CHEST  2 VIEW COMPARISON:  Chest x-ray 07/10/2015. FINDINGS: Lung volumes are extremely low. Chronic elevation of the right hemidiaphragm. Bibasilar opacities are slightly to represent areas of subsegmental atelectasis. No definite consolidative airspace disease. No pleural effusions. No evidence of  pulmonary edema. Heart size is normal. Upper mediastinal contours are distorted by patient positioning. Aortic atherosclerosis. Status post median sternotomy for CABG mitral annuloplasty. Left-sided pacemaker/AICD device in place with lead tips projecting over the expected location of the right ventricular apex. IMPRESSION: 1. Low lung volumes with probable bibasilar  subsegmental atelectasis. No radiographic evidence of acute cardiopulmonary disease. 2. Chronic elevation of the right hemidiaphragm is unchanged. 3. Aortic atherosclerosis. Electronically Signed   By: Vinnie Langton M.D.   On: 09/17/2017 14:41   US Abdomen Limited Ruq  Result Date: 09/17/2017 CLINICAL DATA:  Nausea and vomiting with upper abdominal pain EXAM: ULTRASOUND ABDOMEN LIMITED RIGHT UPPER QUADRANT COMPARISON:  Abdominal ultrasound Jan 12, 2017 FINDINGS: Gallbladder: Within the gallbladder, there are echogenic foci which shadow but do not convincingly move consistent with adherent gallstones. Largest apparent gallstone measures 10 mm. Gallbladder appears contracted. No gallbladder wall edema or pericholecystic fluid. No sonographic Murphy sign noted by sonographer. Common bile duct: Diameter: 4 mm. No intrahepatic or extrahepatic biliary duct dilatation. Liver: No focal lesion identified. Within normal limits in parenchymal echogenicity. Portal vein is patent on color Doppler imaging with normal direction of blood flow towards the liver. IMPRESSION: Cholelithiasis with gallbladder somewhat contracted. No gallbladder wall edema or pericholecystic fluid. It may be prudent in this circumstance to consider nuclear medicine hepatobiliary imaging study to assess for cystic duct patency. Study otherwise unremarkable. Electronically Signed   By: Lowella Grip III M.D.   On: 09/17/2017 13:04    Assessment & Plan:  Payson Evrard Hendrickson is a 84 y.o. male with cholangitis presenting to the ED with pain, rigors, jaundice with bilirubin to 6.8, hypotension and leukocytosis to 28.  He has improved with IVF and antibiotics, and GI has consulted regarding the cholangitis, concern for choledocholithiasis and do not believe he needs an ERCP to evaluate for a stone.  His T bili has trended down and his vitals and pain are improving with medical support. Overall he is improving and no emergent surgical intervention  needed or indicated at this time, and likely has passed the stone.   I have had a long conversation with the patient and the family and have explained biliary pathology and the concern for cholangitis.  We have also discussed that the patient has a fairly extensive cardiac history, and I have personally discussed this with Dr. Patsey Berthold, Anesthesia, and do not feel that this is an appropriate case for [facility] given the co-morbidities and the lack of 24/7 cardiologist support (only day time cardiology available) pending any post operative issues.   -Continue IVF and antibiotics  -Trend LFTs, trend INR given that this likely related to synthetic function issues with the liver and not the Eliquis -Hold Eliquis, ECHO has been ordered to evaluate for interval changes in the mean time -Would go ahead and recommend transferring to [facility] for definitive care given the Anesthesia and Cardiology concerns  -Updated Dr. Wynetta Emery    All questions were answered to the satisfaction of the patient and family.   Virl Cagey 09/18/2017, 3:16 PM

## 2017-09-18 NOTE — Consult Note (Signed)
Referring Provider: Dr. Wynetta Emery  Primary Care Physician:  Octavio Graves, DO Primary Gastroenterologist:  Dr. Gala Romney   Date of Admission: 09/17/17 Date of Consultation: 09/18/17  Reason for Consultation:  Cholangitis   HPI:  Wayne Gordon is an 84 y.o. year old male who was last seen by [facility] in May 2018, with history of chronic RUQ/upper abdominal pain intermittently for the past 3 years. He has seen [facility] in the past but did not feel cholecystectomy was indicated. Known history of gallstones. He presented to the ED yesterday with acute onset of pain starting Saturday, located in epigastric area, accompanied by nausea, vomiting, and chills. He was hypotensive on admission, with WBC count 28.8. Bilirubin elevated at 6.8, AST 382, ALT 569, Alk phos 184. US abdomen with cholelithiasis and gallbladder somewhat contracted, CT abd/pelvis without contrast then completed.  CBD was normal on [place].   This morning, he notes resolution of abdominal pain. No more further N/V. Will take 1500 mg tylenol about every night for sleep. Leukocytosis improved this morning. Bilirubin improved from 6.8 to 3.6 Transaminases improved. Was on Eliquis as outpatient, with last dose 1/20 in the morning. Unable to complete MRI due to ICD in place. Cefepime started 1/20.     Past Medical History:  Diagnosis Date  . Allergy to ACE inhibitors    Angioedema many years ago; patient has tolerated Losartan (ARB) in the past - it was held during [facility] for GI bleed and worsening renal function >> resume Losartan 25 mg QD 06/2015  . Arthritis   . Atrial fibrillation (HCC)    PAF, CHADs2Vasc = 5  . Carotid artery disease (HCC)    50-53% LICA  . CHF (congestive heart failure) ([place])   . Coronary artery disease   . Diabetes mellitus   . GERD (gastroesophageal reflux disease)   . Hyperlipidemia   . Hypertension   . ICD (implantable cardiac defibrillator) in place 03/28/2012   Biotronik, Dr. Lovena Le 03/28/12   . Ischemic cardiomyopathy    S/P CABG x 3; EF 25%  . Mitral regurgitation    S/P mitral valve repair 2013  . Myocardial infarction ([place])   . Renal insufficiency     Past Surgical History:  Procedure Laterality Date  . COLONOSCOPY N/A 04/24/2014   RMR Pancolonic diverticulosis  . CORONARY ARTERY BYPASS GRAFT  10/17/2011   Procedure: CORONARY ARTERY BYPASS GRAFTING (CABG);  Surgeon: Rexene Alberts, MD;  Location: [facility];  Service: Open Heart Surgery;  Laterality: N/A;  . ESOPHAGOGASTRODUODENOSCOPY N/A 04/24/2014   RMR Subtle nodularity the gastric mucosa of uncertain significance-status post biopsy. Hiatal hernia. chronic inflammation, no H.pylori  . ESOPHAGOGASTRODUODENOSCOPY N/A 06/24/2016   Dr. Gala Romney: normal esophagus, small hiatal hernia, normal duodenum  . ICD  03/28/2012  . IMPLANTABLE CARDIOVERTER DEFIBRILLATOR IMPLANT N/A 03/28/2012   Procedure: IMPLANTABLE CARDIOVERTER DEFIBRILLATOR IMPLANT;  Surgeon: Evans Lance, MD;  Location: [facility] CATH LAB;  Service: Cardiovascular;  Laterality: N/A;  . KNEE ARTHROSCOPY  ~ 2008   right  . LEFT AND RIGHT HEART CATHETERIZATION WITH CORONARY ANGIOGRAM N/A 10/12/2011   Procedure: LEFT AND RIGHT HEART CATHETERIZATION WITH CORONARY ANGIOGRAM;  Surgeon: Wellington Hampshire, MD;  Location: [facility] CATH LAB;  Service: Cardiovascular;  Laterality: N/A;  . MITRAL VALVE REPAIR  10/17/2011   Procedure: MITRAL VALVE REPAIR (MVR);  Surgeon: Rexene Alberts, MD;  Location: [facility];  Service: Open Heart Surgery;  Laterality: N/A;  . NASAL SINUS SURGERY  1990's  right    Prior to Admission medications   Medication Sig Start Date End Date Taking? Authorizing Provider  apixaban (ELIQUIS) 5 MG TABS tablet Take 1 tablet (5 mg total) 2 (two) times daily by mouth. 07/11/17  Yes Evans Lance, MD  CALCIUM-MAGNESIUM-ZINC PO Take 2 tablets by mouth daily.   Yes [provider]  carvedilol (COREG) 6.25 MG tablet Take 1 tablet (6.25 mg total) by mouth 2 (two) times  daily. Patient taking differently: Take 12.5 mg by mouth 2 (two) times daily.  12/30/16  Yes Evans Lance, MD  furosemide (LASIX) 20 MG tablet Take 20 mg tablet by month (1 tablet) alternating with 40 mg (2 tablets) every other day. 05/23/16  Yes Isaiah Serge, NP  glipiZIDE (GLUCOTROL XL) 10 MG 24 hr tablet Take 10 mg by mouth 2 times daily at 12 noon and 4 pm. Pt takes it in the AM and PM   Yes [provider]  hydrOXYzine (ATARAX/VISTARIL) 25 MG tablet Take 1 tablet by mouth 3 (three) times daily. 09/09/17  Yes [provider]  Insulin Glargine (TOUJEO SOLOSTAR) 300 UNIT/ML SOPN Inject 12-15 Units as directed daily.  04/27/15  Yes [provider]  losartan (COZAAR) 25 MG tablet Take 1 tablet (25 mg total) by mouth daily. 07/11/15  Yes Weaver, Scott T, PA-C  Multiple Vitamins-Minerals (MULTIVITAMINS THER. W/MINERALS) TABS Take 1 tablet by mouth daily.   Yes [provider]  pantoprazole (PROTONIX) 40 MG tablet Take 1 tablet by mouth daily. 01/03/17  Yes [provider]  rosuvastatin (CRESTOR) 20 MG tablet Take 20 mg by mouth every evening.    Yes [provider]  testosterone cypionate (DEPO-TESTOSTERONE) 200 MG/ML injection Inject 0.5 mg into the muscle every 21 ( twenty-one) days. INJECT 0.6ML- 0.8ML EVERY 2 WEEKS 08/13/14  Yes [provider]    Current Facility-Administered Medications  Medication Dose Route Frequency Provider Last Rate Last Dose  . 0.9 %  sodium chloride infusion   Intravenous Continuous Johnson, Clanford L, MD 75 mL/hr at 09/18/17 0700    . acetaminophen (TYLENOL) tablet 650 mg  650 mg Oral Q6H PRN Johnson, Clanford L, MD       Or  . acetaminophen (TYLENOL) suppository 650 mg  650 mg Rectal Q6H PRN Johnson, Clanford L, MD      . carvedilol (COREG) tablet 6.25 mg  6.25 mg Oral BID WC Johnson, Clanford L, MD      . ceFEPIme (MAXIPIME) 1 g in dextrose 5 % 50 mL IVPB  1 g Intravenous Q24H Wynetta Emery, Clanford L, MD    Stopped at 09/18/17 1119  . HYDROcodone-acetaminophen (NORCO/VICODIN) 5-325 MG per tablet 1-2 tablet  1-2 tablet Oral Q4H PRN Wynetta Emery, Clanford L, MD   2 tablet at 09/17/17 1941  . hydrOXYzine (ATARAX/VISTARIL) tablet 25 mg  25 mg Oral TID PRN Johnson, Clanford L, MD      . insulin aspart (novoLOG) injection 0-9 Units  0-9 Units Subcutaneous TID WC Johnson, Clanford L, MD   1 Units at 09/18/17 1216  . insulin glargine (LANTUS) injection 12 Units  12 Units Subcutaneous Daily Johnson, Clanford L, MD   12 Units at 09/18/17 1049  . metroNIDAZOLE (FLAGYL) IVPB 500 mg  500 mg Intravenous Q8H Danie Binder, MD   Stopped at 09/18/17 1315  . morphine 2 MG/ML injection 1 mg  1 mg Intravenous Q3H PRN Johnson, Clanford L, MD      . ondansetron (ZOFRAN) tablet 4 mg  4 mg Oral Q6H PRN Johnson, Clanford L, MD       Or  . ondansetron (ZOFRAN) injection 4 mg  4 mg Intravenous Q6H PRN Johnson, Clanford L, MD      . pantoprazole (PROTONIX) EC tablet 40 mg  40 mg Oral Daily Johnson, Clanford L, MD   40 mg at 09/18/17 1050    Allergies as of 09/17/2017 - Review Complete 09/17/2017  Allergen Reaction Noted  . Ace inhibitors Swelling and Other (See Comments) 02/03/2014  . Penicillins Palpitations 10/10/2011    Family History  Problem Relation Age of Onset  . Heart disease Mother   . Diabetes Father   . Cardiomyopathy Father   . Colon cancer Neg Hx     Social History   Socioeconomic History  . Marital status: Married    Spouse name: Not on file  . Number of children: Not on file  . Years of education: Not on file  . Highest education level: Not on file  Social Needs  . Financial resource strain: Not on file  . Food insecurity - worry: Not on file  . Food insecurity - inability: Not on file  . Transportation needs - medical: Not on file  . Transportation needs - non-medical: Not on file  Occupational History  . Occupation: Retired  Tobacco Use  . Smoking status: Never Smoker  . Smokeless  tobacco: Never Used  Substance and Sexual Activity  . Alcohol use: No    Alcohol/week: 0.0 oz  . Drug use: No  . Sexual activity: Yes  Other Topics Concern  . Not on file  Social History Narrative  . Not on file    Review of Systems: Gen: see HPI  CV: Denies chest pain, heart palpitations, syncope, edema  Resp: Denies shortness of breath with rest, cough, wheezing GI: see HPI  GU : Denies urinary burning, urinary frequency, urinary incontinence.  MS: Denies joint pain,swelling, cramping Derm: Denies rash, itching, dry skin Psych: Denies depression, anxiety,confusion, or memory loss Heme: Denies bruising, bleeding, and enlarged lymph nodes.  Physical Exam: Vital signs in last 24 hours: Temp:  [97.7 F (36.5 C)-97.9 F (36.6 C)] 97.7 F (36.5 C) (01/21 0500) Pulse Rate:  [53-60] 55 (01/21 0500) Resp:  [16-23] 18 (01/21 0500) BP: (94-114)/(46-59) 105/46 (01/21 0500) SpO2:  [92 %-97 %] 97 % (01/21 0500) Weight:  [252 lb 10.4 oz (114.6 kg)] 252 lb 10.4 oz (114.6 kg) (01/20 1649)   General:   Alert,  Well-developed, well-nourished, pleasant and cooperative in NAD Head:  Normocephalic and atraumatic. Eyes:  Sclera clear, no icterus.    Ears:  Normal auditory acuity. Nose:  No deformity, discharge,  or lesions. Mouth:  No deformity or lesions Lungs:  Clear throughout to auscultation.   Heart:  S1 S2 present,  Abdomen:  Soft, nontender and nondistended. No masses, hepatosplenomegaly or hernias noted. Normal bowel sounds, without guarding, and without rebound.   Rectal:  Deferred  Msk:  Symmetrical without gross deformities. Normal posture. Extremities:  Without  edema. Neurologic:  Alert and  oriented x4 Psych:  Alert and cooperative. Normal mood and affect.  Intake/Output from previous day: 01/20 0701 - 01/21 0700 In: 4660 [P.O.:240; I.V.:420; IV Piggyback:4000] Out: 600 [Urine:600] Intake/Output this shift: Total I/O In: -  Out: 750 [Urine:750]  Lab  Results: Recent Labs    09/17/17 1002 09/18/17 0502  WBC 28.8* 18.7*  HGB 15.1 13.5  HCT 44.9 39.6  PLT 123* 107*   BMET Recent Labs  09/17/17 1002 09/18/17 0502  NA 132* 129*  K 4.6 4.1  CL 95* 98*  CO2 23 24  GLUCOSE 156* 120*  BUN 41* 50*  CREATININE 3.44* 3.35*  CALCIUM 8.6* 7.5*   LFT Recent Labs    09/17/17 1002 09/18/17 0502  PROT 6.6 5.6*  ALBUMIN 3.3* 2.8*  AST 382* 161*  ALT 569* 341*  ALKPHOS 184* 135*  BILITOT 6.8* 3.6*   PT/INR Recent Labs    09/18/17 0502  LABPROT 21.3*  INR 1.86    Studies/Results: Ct Abdomen Pelvis Wo Contrast  Result Date: 09/17/2017 CLINICAL DATA:  84 year old male with abdominal pain, nausea vomiting this morning. Difficulty urinating, dark colored urine. EXAM: CT ABDOMEN AND PELVIS WITHOUT CONTRAST TECHNIQUE: Multidetector CT imaging of the abdomen and pelvis was performed following the standard protocol without IV contrast. COMPARISON:  CT Abdomen and Pelvis 02/03/2014. FINDINGS: Lower chest: Cardiomegaly. Calcified coronary artery atherosclerosis. Prior sternotomy. No pericardial effusion. Chronic pulmonary architectural distortion in the right lower lobe. Superimposed chronic elevation of the right hemidiaphragm, with right middle and lower lobe atelectasis. Superimposed other mild dependent pulmonary atelectasis. No pleural effusion. Hepatobiliary: Cholelithiasis (series 2, image 12). No pericholecystic inflammation. Negative visible noncontrast CT. Pancreas: Chronic pancreatic atrophy. Spleen: Negative. Adrenals/Urinary Tract: Normal adrenal glands. Chronic bilateral perinephric stranding which is nonspecific. No hydronephrosis. Both ureters are decompressed. No urologic calculus identified. Decompressed and unremarkable urinary bladder. No perivesical stranding. Stomach/Bowel: Retained low-density stool in the rectum and distal sigmoid colon. Chronic sigmoid redundancy with severe diverticulosis throughout the mid and  proximal sigmoid. Difficult to exclude mild inflammation at the junction of the descending and sigmoid colon (coronal image 47), although this is similar appearance to the 2015 CT. Severe diverticulosis continues in the left colon and throughout the transverse colon. No active inflammation. Oral contrast has reached the cecum which is on a lax mesentery in the anterior right abdomen. Negative cecum and appendix. Negative terminal ileum. No dilated small bowel. Moderately distended stomach with oral contrast. Negative duodenum. No abdominal free fluid or free air. Vascular/Lymphatic: Extensive Aortoiliac calcified atherosclerosis. Vascular patency is not evaluated in the absence of IV contrast. No lymphadenopathy. Reproductive: Negative. Other: No pelvic free fluid. Musculoskeletal: Advanced lumbar spine degeneration. Chronic lumbar spinal stenosis. No acute osseous abnormality identified. IMPRESSION: 1. Extensive large bowel diverticulosis from the hepatic flexure to the sigmoid. Difficult to exclude mild acute diverticulitis at the junction of the descending and sigmoid colon in the left lower quadrant, but the appearance is stable to a 2015 CT. 2. Cholelithiasis.  No CT evidence of acute cholecystitis. 3. No other acute or inflammatory findings identified in the abdomen or pelvis. Normal appendix. 4. Aortic Atherosclerosis (ICD10-I70.0). Electronically Signed   By: Genevie Ann M.D.   On: 09/17/2017 14:51   Dg Chest 2 View  Result Date: 09/17/2017 CLINICAL DATA:  84 year old male with history respiratory distress. Evaluate for pneumonia. EXAM: CHEST  2 VIEW COMPARISON:  Chest x-ray 07/10/2015. FINDINGS: Lung volumes are extremely low. Chronic elevation of the right hemidiaphragm. Bibasilar opacities are slightly to represent areas of subsegmental atelectasis. No definite consolidative airspace disease. No pleural effusions. No evidence of pulmonary edema. Heart size is normal. Upper mediastinal contours are  distorted by patient positioning. Aortic atherosclerosis. Status post median sternotomy for CABG mitral annuloplasty. Left-sided pacemaker/AICD device in place with lead tips projecting over the expected location of the right ventricular apex. IMPRESSION: 1. Low lung volumes with probable bibasilar subsegmental atelectasis. No radiographic evidence of acute cardiopulmonary disease.  2. Chronic elevation of the right hemidiaphragm is unchanged. 3. Aortic atherosclerosis. Electronically Signed   By: Vinnie Langton M.D.   On: 09/17/2017 14:41   US Abdomen Limited Ruq  Result Date: 09/17/2017 CLINICAL DATA:  Nausea and vomiting with upper abdominal pain EXAM: ULTRASOUND ABDOMEN LIMITED RIGHT UPPER QUADRANT COMPARISON:  Abdominal ultrasound Jan 12, 2017 FINDINGS: Gallbladder: Within the gallbladder, there are echogenic foci which shadow but do not convincingly move consistent with adherent gallstones. Largest apparent gallstone measures 10 mm. Gallbladder appears contracted. No gallbladder wall edema or pericholecystic fluid. No sonographic Murphy sign noted by sonographer. Common bile duct: Diameter: 4 mm. No intrahepatic or extrahepatic biliary duct dilatation. Liver: No focal lesion identified. Within normal limits in parenchymal echogenicity. Portal vein is patent on color Doppler imaging with normal direction of blood flow towards the liver. IMPRESSION: Cholelithiasis with gallbladder somewhat contracted. No gallbladder wall edema or pericholecystic fluid. It may be prudent in this circumstance to consider nuclear medicine hepatobiliary imaging study to assess for cystic duct patency. Study otherwise unremarkable. Electronically Signed   By: Lowella Grip III M.D.   On: 09/17/2017 13:04    Impression: 84 year old male with chronic history of intermittent epigastric/RUQ pain, presenting with acute on chronic abdominal pain, N/V, and elevated LFTs with associated leukocytosis and concerns for cholangitis.  No CBD dilation on US abdomen, CT on file, and unable to complete MRCP due to implanted defibrillator. Clinical presentation complicated by chronic use of Eliquis, with last dose on 09/17/17 in the morning. Clinically, he feels improved since admission and is on cefepime. Improvement in LFTs and leukocytosis this morning. Would need to be off Eliquis 48-72 hours prior to ERCP.  Agree with ECHO ordered, as last on file was in 2016 that I can see. May be best served at [facility] due to comorbidities.  Plan: Continue to hold Eliquis Continue antibiotic coverage: appreciate pharmacy following Repeat HFP, INR, CBC in am Follow-up on ECHO ordered Would recommend transfer to [facility] to follow during hospitalization due to comorbidities  Annitta Needs, PhD, ANP-BC [facility]     LOS: 1 day    09/18/2017, 12:42 PM

## 2017-09-18 NOTE — Care Management Note (Addendum)
Case Management Note  Patient Details  Name: Wayne Gordon MRN: [id number] Date of Birth: Apr 29, 1934  Subjective/Objective:    Adm from home, sepsis/cholangitis. GI and Surgery consulted. ? ERCP.                 Action/Plan: CM following for needs.    Expected Discharge Date:   unk, estimate a few more days per attending.              Expected Discharge Plan:     In-House Referral:     Discharge planning Services  CM Consult  Post Acute Care Choice:    Choice offered to:     DME Arranged:    DME Agency:     HH Arranged:    HH Agency:     Status of Service:  In process, will continue to follow  If discussed at Long Length of Stay Meetings, dates discussed:    Additional Comments:  Destinee Taber, Chrystine Oiler, RN 09/18/2017, 12:17 PM

## 2017-09-18 NOTE — Progress Notes (Addendum)
PROGRESS NOTE    Wayne Gordon  ZOX:[id number]  DOB: 03-12-1934  DOA: 09/17/2017 PCP: Samuel Jester, DO   Brief Admission Hx: Wayne Gordon Desire is a 84 y.o. male with CHF cardiomyopathy, Atrial fibrillation on apixaban, CAD, DM, HTN, chronic cholelithiasis presents to ED with complaints of vomiting for last 24 hours prior to arrival.  He also reports RUQ and epigastric pain that has been intermittent and severe 8/10.  He also has nausea.  He is not vomiting.  He has had progressive symptoms of weakness, fever and chills over past couple of days.  He has positive symptoms of rigors.  Eating seems to worsen abdominal pain and nausea.  He has poor appetite. He has had poor urine output worsening in last 24 hours. He has only really been able to keep down liquids in past 12 hours.  He has clinically improved with medical therapy.  GI evaluated him and felt that stone likely has passed and no ERCP indicated.  Pt was seen by surgery for cholecystectomy but recommended tx to [facility] because the anesthesiologist says he is too high risk to have surgery at [facility].  Pt has been seen by [facility] for this in the past.  He will need cardiology consult for preop clearance if they agreed to proceed with surgery.     MDM/Assessment & Plan:   1. Sepsis - with fever and rigors, secondary to intra-abdominal infection, follow blood cultures, continue IVF hydration, lactate trending down, continue broad spectrum antibiotics.  Monitor on telemetry.  2. Leukocytosis - secondary to cholangitis - continue broad spectrum antibiotics, WBC trending down. 3. Cholangitis - unable to perform MRCP because of ICD, GI consulted for eRCP consideration but felt that it was not needed at this time. Continue broad spectrum antibiotics and supportive care.  Holding Eliquis since 1/20 in anticipation of possible cholecystectomy. 4. Elevated LFTs - suspect secondary to cholangitis, follow and trend.  LFTs slowly  trending down. 5. Bradycardia - HR in the 50s, He is ventricular paced.  I believe this is normal for him.  Reduced dose of coreg by 50% because of soft BPs on admission.   Follow clinically.   6. Chronic systolic CHF - gently hydrate and follow clinically.  Hold lasix temporarily while re-hydrating.  7. Type 2 DM - carb modified diet, sliding scale coverage, check A1c. Resume basal insulin. Regular glucose testing.  8. Cholelithiasis - GI consult for ERCP consideration.  Holding eliquis for now.   9. Abdominal pain RUQ - IV pain and nausea medications ordered.  10. Acute on chronic CKD stage 3 - slight worsening with sepsis and dehydration, IVF hydration ordered, renally dose meds as appropriate, holding losartan for now.  11. Dyslipidemia - Hold crestor temporarily due to acute elevation in liver enzymes.  12. GERD - continue pantoprazole for GI protection.    DVT Prophylaxis: SCDs Code Status: Full  Family Communication: wife/daughter bedside   Disposition Plan:  transferring to [facility]  Subjective: Patient says he is feeling better today.  He is tolerating clear liquids.  He has no fever or chills at this time.  His abdominal pain is better.  Objective: Vitals:   09/17/17 1539 09/17/17 1649 09/17/17 2111 09/18/17 0500  BP: (!) 114/58 (!) 101/48 (!) 94/46 (!) 105/46  Pulse: 60 (!) 53 (!) 55 (!) 55  Resp: 18 18 18 18   Temp:  97.9 F (36.6 C) 97.7 F (36.5 C) 97.7 F (36.5 C)  TempSrc:  Oral Oral Oral  SpO2: 92% 96% 96% 97%  Weight:  114.6 kg (252 lb 10.4 oz)    Height:  6\' 5"  (1.956 m)      Intake/Output Summary (Last 24 hours) at 09/18/2017 1051 Last data filed at 09/18/2017 0800 Gross per 24 hour  Intake 4660 ml  Output 1100 ml  Net 3560 ml   Filed Weights   09/17/17 0952 09/17/17 1649  Weight: 107.5 kg (237 lb) 114.6 kg (252 lb 10.4 oz)    REVIEW OF SYSTEMS  As per history otherwise all reviewed and reported negative  Exam:  General exam: Awake, alert, no  distress, cooperative and pleasant. Respiratory system: Clear. No increased work of breathing. Cardiovascular system: S1 & S2 heard, RRR. No JVD, murmurs, gallops, clicks or pedal edema. Gastrointestinal system: Abdomen is nondistended, soft and mild epigastric tenderness with deep palpation, no guarding or rebound tenderness. Normal bowel sounds heard. Central nervous system: Alert and oriented. No focal neurological deficits. Extremities: no CCE.  Data Reviewed: Basic Metabolic Panel: Recent Labs  Lab 09/17/17 1002 09/18/17 0502  NA 132* 129*  K 4.6 4.1  CL 95* 98*  CO2 23 24  GLUCOSE 156* 120*  BUN 41* 50*  CREATININE 3.44* 3.35*  CALCIUM 8.6* 7.5*  MG  --  1.6*   Liver Function Tests: Recent Labs  Lab 09/17/17 1002 09/18/17 0502  AST 382* 161*  ALT 569* 341*  ALKPHOS 184* 135*  BILITOT 6.8* 3.6*  PROT 6.6 5.6*  ALBUMIN 3.3* 2.8*   Recent Labs  Lab 09/17/17 1006  LIPASE 23   No results for input(s): AMMONIA in the last 168 hours. CBC: Recent Labs  Lab 09/17/17 1002 09/18/17 0502  WBC 28.8* 18.7*  NEUTROABS 25.7* 16.1*  HGB 15.1 13.5  HCT 44.9 39.6  MCV 94.3 92.3  PLT 123* 107*   Cardiac Enzymes: Recent Labs  Lab 09/17/17 1002 09/17/17 1853 09/18/17 0032 09/18/17 0502  TROPONINI 0.06* 0.04* 0.04* 0.04*   CBG (last 3)  Recent Labs    09/17/17 1648 09/17/17 2019 09/18/17 0813  GLUCAP 164* 222* 91   Recent Results (from the past 240 hour(s))  Blood culture (routine x 2)     Status: None (Preliminary result)   Collection Time: 09/17/17 11:12 AM  Result Value Ref Range Status   Specimen Description RIGHT ANTECUBITAL  Final   Special Requests   Final    BOTTLES DRAWN AEROBIC AND ANAEROBIC Blood Culture adequate volume   Culture NO GROWTH < 24 HOURS  Final   Report Status PENDING  Incomplete  Blood culture (routine x 2)     Status: None (Preliminary result)   Collection Time: 09/17/17 11:18 AM  Result Value Ref Range Status   Specimen  Description BLOOD RIGHT HAND  Final   Special Requests   Final    BOTTLES DRAWN AEROBIC AND ANAEROBIC Blood Culture results may not be optimal due to an inadequate volume of blood received in culture bottles   Culture NO GROWTH < 24 HOURS  Final   Report Status PENDING  Incomplete     Studies: Ct Abdomen Pelvis Wo Contrast  Result Date: 09/17/2017 CLINICAL DATA:  84 year old male with abdominal pain, nausea vomiting this morning. Difficulty urinating, dark colored urine. EXAM: CT ABDOMEN AND PELVIS WITHOUT CONTRAST TECHNIQUE: Multidetector CT imaging of the abdomen and pelvis was performed following the standard protocol without IV contrast. COMPARISON:  CT Abdomen and Pelvis 02/03/2014. FINDINGS: Lower chest: Cardiomegaly. Calcified coronary artery atherosclerosis. Prior sternotomy.  No pericardial effusion. Chronic pulmonary architectural distortion in the right lower lobe. Superimposed chronic elevation of the right hemidiaphragm, with right middle and lower lobe atelectasis. Superimposed other mild dependent pulmonary atelectasis. No pleural effusion. Hepatobiliary: Cholelithiasis (series 2, image 12). No pericholecystic inflammation. Negative visible noncontrast CT. Pancreas: Chronic pancreatic atrophy. Spleen: Negative. Adrenals/Urinary Tract: Normal adrenal glands. Chronic bilateral perinephric stranding which is nonspecific. No hydronephrosis. Both ureters are decompressed. No urologic calculus identified. Decompressed and unremarkable urinary bladder. No perivesical stranding. Stomach/Bowel: Retained low-density stool in the rectum and distal sigmoid colon. Chronic sigmoid redundancy with severe diverticulosis throughout the mid and proximal sigmoid. Difficult to exclude mild inflammation at the junction of the descending and sigmoid colon (coronal image 47), although this is similar appearance to the 2015 CT. Severe diverticulosis continues in the left colon and throughout the transverse colon. No  active inflammation. Oral contrast has reached the cecum which is on a lax mesentery in the anterior right abdomen. Negative cecum and appendix. Negative terminal ileum. No dilated small bowel. Moderately distended stomach with oral contrast. Negative duodenum. No abdominal free fluid or free air. Vascular/Lymphatic: Extensive Aortoiliac calcified atherosclerosis. Vascular patency is not evaluated in the absence of IV contrast. No lymphadenopathy. Reproductive: Negative. Other: No pelvic free fluid. Musculoskeletal: Advanced lumbar spine degeneration. Chronic lumbar spinal stenosis. No acute osseous abnormality identified. IMPRESSION: 1. Extensive large bowel diverticulosis from the hepatic flexure to the sigmoid. Difficult to exclude mild acute diverticulitis at the junction of the descending and sigmoid colon in the left lower quadrant, but the appearance is stable to a 2015 CT. 2. Cholelithiasis.  No CT evidence of acute cholecystitis. 3. No other acute or inflammatory findings identified in the abdomen or pelvis. Normal appendix. 4. Aortic Atherosclerosis (ICD10-I70.0). Electronically Signed   By: Odessa Fleming M.D.   On: 09/17/2017 14:51   Dg Chest 2 View  Result Date: 09/17/2017 CLINICAL DATA:  84 year old male with history respiratory distress. Evaluate for pneumonia. EXAM: CHEST  2 VIEW COMPARISON:  Chest x-ray 07/10/2015. FINDINGS: Lung volumes are extremely low. Chronic elevation of the right hemidiaphragm. Bibasilar opacities are slightly to represent areas of subsegmental atelectasis. No definite consolidative airspace disease. No pleural effusions. No evidence of pulmonary edema. Heart size is normal. Upper mediastinal contours are distorted by patient positioning. Aortic atherosclerosis. Status post median sternotomy for CABG mitral annuloplasty. Left-sided pacemaker/AICD device in place with lead tips projecting over the expected location of the right ventricular apex. IMPRESSION: 1. Low lung volumes  with probable bibasilar subsegmental atelectasis. No radiographic evidence of acute cardiopulmonary disease. 2. Chronic elevation of the right hemidiaphragm is unchanged. 3. Aortic atherosclerosis. Electronically Signed   By: Trudie Reed M.D.   On: 09/17/2017 14:41   US Abdomen Limited Ruq  Result Date: 09/17/2017 CLINICAL DATA:  Nausea and vomiting with upper abdominal pain EXAM: ULTRASOUND ABDOMEN LIMITED RIGHT UPPER QUADRANT COMPARISON:  Abdominal ultrasound Jan 12, 2017 FINDINGS: Gallbladder: Within the gallbladder, there are echogenic foci which shadow but do not convincingly move consistent with adherent gallstones. Largest apparent gallstone measures 10 mm. Gallbladder appears contracted. No gallbladder wall edema or pericholecystic fluid. No sonographic Murphy sign noted by sonographer. Common bile duct: Diameter: 4 mm. No intrahepatic or extrahepatic biliary duct dilatation. Liver: No focal lesion identified. Within normal limits in parenchymal echogenicity. Portal vein is patent on color Doppler imaging with normal direction of blood flow towards the liver. IMPRESSION: Cholelithiasis with gallbladder somewhat contracted. No gallbladder wall edema or pericholecystic fluid. It may be  prudent in this circumstance to consider nuclear medicine hepatobiliary imaging study to assess for cystic duct patency. Study otherwise unremarkable. Electronically Signed   By: Bretta Bang III M.D.   On: 09/17/2017 13:04     Scheduled Meds: . carvedilol  6.25 mg Oral BID WC  . insulin aspart  0-9 Units Subcutaneous TID WC  . insulin glargine  12 Units Subcutaneous Daily  . pantoprazole  40 mg Oral Daily   Continuous Infusions: . sodium chloride 75 mL/hr at 09/18/17 0700  . ceFEPime (MAXIPIME) IV      Active Problems:   Cholangitis   Leukocytosis   Sepsis (HCC)   Hypotension   Lactic acidosis   Elevated troponin   S/P mitral valve repair   S/P CABG x 3   Long term (current) use of  anticoagulants   Chronic systolic heart failure (HCC)   Ischemic cardiomyopathy   CKD (chronic kidney disease) stage 3, GFR 30-59 ml/min (HCC)   Current use of long term anticoagulation   HTN (hypertension)   Automatic implantable cardioverter-defibrillator in situ   HLD (hyperlipidemia)   GERD (gastroesophageal reflux disease)   Abdominal pain, epigastric   Body mass index (BMI) of 28.0-28.9 in adult   Diabetes (HCC)   Gonadotropin deficiency (HCC)   Diabetes mellitus, type 2 (HCC)   Atrial fibrillation (HCC)   ICD (implantable cardioverter-defibrillator) discharge   Carotid artery disease (HCC)   Cholelithiasis   Elevated liver enzymes   Acute renal failure superimposed on stage 3 chronic kidney disease (HCC)   Time spent:   Standley Dakins, MD, FAAFP [facility] Pager [phone]  If 7PM-7AM, please contact night-coverage [email or web address] Password [email or web address] 09/18/2017, 10:51 AM    LOS: 1 day

## 2017-09-19 DIAGNOSIS — I5022 Chronic systolic (congestive) heart failure: Secondary | ICD-10-CM

## 2017-09-19 DIAGNOSIS — R109 Unspecified abdominal pain: Secondary | ICD-10-CM

## 2017-09-19 DIAGNOSIS — I482 Chronic atrial fibrillation: Secondary | ICD-10-CM

## 2017-09-19 DIAGNOSIS — Z95810 Presence of automatic (implantable) cardiac defibrillator: Secondary | ICD-10-CM

## 2017-09-19 DIAGNOSIS — Z9889 Other specified postprocedural states: Secondary | ICD-10-CM

## 2017-09-19 DIAGNOSIS — I255 Ischemic cardiomyopathy: Secondary | ICD-10-CM

## 2017-09-19 DIAGNOSIS — Z951 Presence of aortocoronary bypass graft: Secondary | ICD-10-CM

## 2017-09-19 DIAGNOSIS — N183 Chronic kidney disease, stage 3 (moderate): Secondary | ICD-10-CM

## 2017-09-19 DIAGNOSIS — Z7901 Long term (current) use of anticoagulants: Secondary | ICD-10-CM

## 2017-09-19 DIAGNOSIS — N179 Acute kidney failure, unspecified: Secondary | ICD-10-CM

## 2017-09-19 LAB — GLUCOSE, CAPILLARY
Glucose-Capillary: 88 mg/dL (ref 65–99)
Glucose-Capillary: 155 mg/dL — ABNORMAL HIGH (ref 65–99)
Glucose-Capillary: 132 mg/dL — ABNORMAL HIGH (ref 65–99)
Glucose-Capillary: 131 mg/dL — ABNORMAL HIGH (ref 65–99)

## 2017-09-19 LAB — BLOOD CULTURE ID PANEL (REFLEXED)
Acinetobacter baumannii: NOT DETECTED
CANDIDA KRUSEI: NOT DETECTED
CARBAPENEM RESISTANCE: NOT DETECTED
Candida albicans: NOT DETECTED
Candida glabrata: NOT DETECTED
Candida parapsilosis: NOT DETECTED
Candida tropicalis: NOT DETECTED
ENTEROBACTERIACEAE SPECIES: DETECTED — AB
ENTEROCOCCUS SPECIES: NOT DETECTED
Enterobacter cloacae complex: NOT DETECTED
Escherichia coli: NOT DETECTED
HAEMOPHILUS INFLUENZAE: NOT DETECTED
KLEBSIELLA OXYTOCA: NOT DETECTED
Klebsiella pneumoniae: DETECTED — AB
LISTERIA MONOCYTOGENES: NOT DETECTED
NEISSERIA MENINGITIDIS: NOT DETECTED
PSEUDOMONAS AERUGINOSA: NOT DETECTED
Proteus species: NOT DETECTED
STREPTOCOCCUS AGALACTIAE: NOT DETECTED
STREPTOCOCCUS PNEUMONIAE: NOT DETECTED
STREPTOCOCCUS PYOGENES: NOT DETECTED
STREPTOCOCCUS SPECIES: NOT DETECTED
Serratia marcescens: NOT DETECTED
Staphylococcus aureus (BCID): NOT DETECTED
Staphylococcus species: NOT DETECTED

## 2017-09-19 LAB — URINALYSIS, ROUTINE W REFLEX MICROSCOPIC
BACTERIA UA: NONE SEEN
Bilirubin Urine: NEGATIVE
Glucose, UA: NEGATIVE mg/dL
Ketones, ur: NEGATIVE mg/dL
Leukocytes, UA: NEGATIVE
Nitrite: NEGATIVE
PROTEIN: NEGATIVE mg/dL
SPECIFIC GRAVITY, URINE: 1.004 — AB (ref 1.005–1.030)
SQUAMOUS EPITHELIAL / LPF: NONE SEEN
pH: 6 (ref 5.0–8.0)

## 2017-09-19 MED ORDER — MORPHINE SULFATE (PF) 4 MG/ML IV SOLN: 1.0000 mg | INTRAVENOUS | Status: DC | PRN

## 2017-09-19 MED ORDER — VANCOMYCIN HCL 10 G IV SOLR
1500.0000 mg | Freq: Once | INTRAVENOUS | Status: DC
  Filled 2017-09-19: qty 1500

## 2017-09-19 MED ORDER — DEXTROSE 5 % IV SOLN
2.0000 g | INTRAVENOUS | Status: DC
  Administered 2017-09-19 – 2017-09-22 (×4): 2 g via INTRAVENOUS
  Filled 2017-09-19 (×4): qty 2

## 2017-09-19 MED ORDER — SALINE SPRAY 0.65 % NA SOLN
1.0000 | NASAL | Status: DC | PRN
  Administered 2017-09-20: 1 via NASAL
  Filled 2017-09-19: qty 44

## 2017-09-19 NOTE — Consult Note (Signed)
Reason for Consult: Cholangitis  Referring Physician: Dr. Nada Libman Chief complaint: Progressive weakness, chills/rigors, recurrent nausea PCP:  Octavio Graves, DO   Wayne Gordon is an 84 y.o. male.   HPI: Patient is a 84 year old male who presented to the ED at [facility] with right upper quadrant epigastric pain is been intermittent to severe.  He has had nausea and some vomiting.  He complained of progressive symptoms of weakness, fever and chills over the past 48 hours.  Eating seems to make his abdominal pain and nausea worse.  He has had a poor appetite and poor urine output. Symptoms became worse on 09/16/17.  He ended up in the ED on 09/17/17.     Workup in the ED suggests he was toxic appearing on presentation.  Hypotensive and treated with a fluid bolus.  His WBC was elevated and his liver enzymes were elevated.  Abdominal ultrasound on admission shows cholelithiasis with a somewhat contracted gallbladder.  No gallbladder wall edema or pericholecystic fluid common bile duct was 4 mm there was no intrahepatic or extrahepatic duct dilatation.  CT of the abdomen was then obtained and shows extensive large bowel diverticulosis from the hepatic flexure to the sigmoid difficult to exclude mild acute diverticulitis at the junction of the descending of the sigmoid in the left lower quadrant cholelithiasis but no evidence of acute cholecystitis.  No other acute or inflammatory findings.  GI consult was obtained, he is known and followed by Dr. Manus Rudd in [place].  It was their opinion he had no need for an ERCP.  He was also seen by Dr. Blake [facility].  It was her opinion that he was improving and did not need acute surgical care and given his comorbid conditions he should be transferred to [facility] for further evaluation and possible cholecystectomy.  He was referred to [facility] and Dr. Rosendo Gros who has seen him in the past in our  office.  He was seen last year and symptoms were not consistent with gallbladder disease.  On admission to the ED at [facility].  He was afebrile blood pressure 101, 153 heart rate 55.  Sodium was 132, creatinine 3.44, glucose 156, alk phos 184, albumin 3.3, lipase 23, AST 382, ALT 569, total bilirubin 6.8.  Troponin 0.6, 0.40.4 respectively.   WBC on admission 28.8 hemoglobin 15 hematocrit 44 platelets 123,000.  Hemoglobin A1c 8.0, TSH 3.29.  Repeat labs this a.m. shows a sodium is down to 129, glucose is 120, creatinine is 3.35 alk phos loss is down to 135, albumin is down to 2.8, AST is 161, ALT is 341, total bilirubin is down to 3.6, WBC is down to 18.7, platelets are 107,000.  INR is 1.86 PTT is 37.  His last dose of Eliquis is reported on 09/17/17.  We are asked to see.  He has been treated with vancomycin and is currently on Rocephin.  Past Medical History:  Diagnosis Date  . Allergy to ACE inhibitors    Angioedema many years ago; patient has tolerated Losartan (ARB) in the past - it was held during [facility] for GI bleed and worsening renal function >> resume Losartan 25 mg QD 06/2015  . Arthritis   . Atrial fibrillation (HCC)    PAF, CHADs2Vasc = 5  . Carotid artery disease (HCC)    79-02% LICA  . CHF (congestive heart failure) ([place])   . Coronary artery disease   . Diabetes mellitus   .  GERD (gastroesophageal reflux disease)   . Hyperlipidemia   . Hypertension   . ICD (implantable cardiac defibrillator) in place 03/28/2012   [place], Dr. Lovena Le 03/28/12  . Ischemic cardiomyopathy    S/P CABG x 3; EF 25%  . Mitral regurgitation    S/P mitral valve repair 2013  . Myocardial infarction ([place])   . Renal insufficiency     Past Surgical History:  Procedure Laterality Date  . COLONOSCOPY N/A 04/24/2014   RMR Pancolonic diverticulosis  . CORONARY ARTERY BYPASS GRAFT  10/17/2011   Procedure: CORONARY ARTERY BYPASS GRAFTING (CABG);  Surgeon: Rexene Alberts, MD;  Location: [facility];  Service: Open Heart Surgery;  Laterality: N/A;  . ESOPHAGOGASTRODUODENOSCOPY N/A 04/24/2014   RMR Subtle nodularity the gastric mucosa of uncertain significance-status post biopsy. Hiatal hernia. chronic inflammation, no H.pylori  . ESOPHAGOGASTRODUODENOSCOPY N/A 06/24/2016   Dr. Gala Romney: normal esophagus, small hiatal hernia, normal duodenum  . ICD  03/28/2012  . IMPLANTABLE CARDIOVERTER DEFIBRILLATOR IMPLANT N/A 03/28/2012   Procedure: IMPLANTABLE CARDIOVERTER DEFIBRILLATOR IMPLANT;  Surgeon: Evans Lance, MD;  Location: [facility] CATH LAB;  Service: Cardiovascular;  Laterality: N/A;  . KNEE ARTHROSCOPY  ~ 2008   right  . LEFT AND RIGHT HEART CATHETERIZATION WITH CORONARY ANGIOGRAM N/A 10/12/2011   Procedure: LEFT AND RIGHT HEART CATHETERIZATION WITH CORONARY ANGIOGRAM;  Surgeon: Wellington Hampshire, MD;  Location: [place] CATH LAB;  Service: Cardiovascular;  Laterality: N/A;  . MITRAL VALVE REPAIR  10/17/2011   Procedure: MITRAL VALVE REPAIR (MVR);  Surgeon: Rexene Alberts, MD;  Location: [place];  Service: Open Heart Surgery;  Laterality: N/A;  . NASAL SINUS SURGERY  1990's   right    Family History  Problem Relation Age of Onset  . Heart disease Mother   . Diabetes Father   . Cardiomyopathy Father   . Colon cancer Neg Hx     Social History:  reports that  has never smoked. he has never used smokeless tobacco. He reports that he does not drink alcohol or use drugs. Worked in [employer] with wife and daughter is here also.  Allergies:  Allergies  Allergen Reactions  . Ace Inhibitors Swelling and Other (See Comments)    Kidney issues; angioedema per PMH in chart  . Penicillins Palpitations        Medications:  Prior to Admission:  Medications Prior to Admission  Medication Sig Dispense Refill Last Dose  . apixaban (ELIQUIS) 5 MG TABS tablet Take 1 tablet (5 mg total) 2 (two) times daily by mouth. 56 tablet 0 09/17/2017 at Unknown time  . CALCIUM-MAGNESIUM-ZINC PO Take 2  tablets by mouth daily.   09/16/2017 at Unknown time  . carvedilol (COREG) 6.25 MG tablet Take 1 tablet (6.25 mg total) by mouth 2 (two) times daily. (Patient taking differently: Take 12.5 mg by mouth 2 (two) times daily. ) 180 tablet 3 09/17/2017 at 0900  . furosemide (LASIX) 20 MG tablet Take 20 mg tablet by month (1 tablet) alternating with 40 mg (2 tablets) every other day. 90 tablet 3 09/17/2017 at Unknown time  . glipiZIDE (GLUCOTROL XL) 10 MG 24 hr tablet Take 10 mg by mouth 2 times daily at 12 noon and 4 pm. Pt takes it in the AM and PM   09/17/2017 at Unknown time  . hydrOXYzine (ATARAX/VISTARIL) 25 MG tablet Take 1 tablet by mouth 3 (three) times daily.   09/16/2017 at Unknown time  . Insulin Glargine (TOUJEO SOLOSTAR) 300 UNIT/ML SOPN Inject  12-15 Units as directed daily.    Past Week at Unknown time  . losartan (COZAAR) 25 MG tablet Take 1 tablet (25 mg total) by mouth daily. 30 tablet 11 09/16/2017 at Unknown time  . Multiple Vitamins-Minerals (MULTIVITAMINS THER. W/MINERALS) TABS Take 1 tablet by mouth daily.   09/16/2017 at Unknown time  . pantoprazole (PROTONIX) 40 MG tablet Take 1 tablet by mouth daily.   09/17/2017 at Unknown time  . rosuvastatin (CRESTOR) 20 MG tablet Take 20 mg by mouth every evening.    09/16/2017 at Unknown time  . testosterone cypionate (DEPO-TESTOSTERONE) 200 MG/ML injection Inject 0.5 mg into the muscle every 21 ( twenty-one) days. INJECT 0.6ML- 0.8ML EVERY 2 WEEKS   09/01/2017 at Unknown time   Scheduled: . carvedilol  6.25 mg Oral BID WC  . feeding supplement (GLUCERNA SHAKE)  237 mL Oral TID BM  . insulin aspart  0-9 Units Subcutaneous TID WC  . insulin glargine  12 Units Subcutaneous Daily  . pantoprazole  40 mg Oral Daily   Anti-infectives (From admission, onward)   Start     Dose/Rate Route Frequency Ordered Stop   09/19/17 1200  vancomycin (VANCOCIN) 1,500 mg in sodium chloride 0.9 % 500 mL IVPB  Status:  Discontinued     1,500 mg 250 mL/hr over 120  Minutes Intravenous  Once 09/19/17 0247 09/19/17 0633   09/19/17 0800  cefTRIAXone (ROCEPHIN) 2 g in dextrose 5 % 50 mL IVPB     2 g 100 mL/hr over 30 Minutes Intravenous Every 24 hours 09/19/17 0642     09/18/17 1100  ceFEPIme (MAXIPIME) 1 g in dextrose 5 % 50 mL IVPB  Status:  Discontinued     1 g 100 mL/hr over 30 Minutes Intravenous Every 24 hours 09/17/17 1713 09/19/17 0633   09/18/17 1100  metroNIDAZOLE (FLAGYL) IVPB 500 mg  Status:  Discontinued     500 mg 100 mL/hr over 60 Minutes Intravenous Every 8 hours 09/18/17 1058 09/19/17 1130   09/17/17 1130  vancomycin (VANCOCIN) IVPB 1000 mg/200 mL premix     1,000 mg 200 mL/hr over 60 Minutes Intravenous Every 1 hr x 2 09/17/17 1116 09/17/17 1435   09/17/17 1115  vancomycin (VANCOCIN) 2,000 mg in sodium chloride 0.9 % 500 mL IVPB  Status:  Discontinued     2,000 mg 250 mL/hr over 120 Minutes Intravenous  Once 09/17/17 1110 09/17/17 1116   09/17/17 1115  ceFEPIme (MAXIPIME) 2 g in dextrose 5 % 50 mL IVPB     2 g 100 mL/hr over 30 Minutes Intravenous  Once 09/17/17 1110 09/17/17 1232      Results for orders placed or performed during the hospital encounter of 09/17/17 (from the past 48 hour(s))  Glucose, capillary     Status: Abnormal   Collection Time: 09/17/17  4:48 PM  Result Value Ref Range   Glucose-Capillary 164 (H) 65 - 99 mg/dL  Troponin I     Status: Abnormal   Collection Time: 09/17/17  6:53 PM  Result Value Ref Range   Troponin I 0.04 (HH) <0.03 ng/mL    Comment: CRITICAL VALUE NOTED.  VALUE IS CONSISTENT WITH PREVIOUSLY REPORTED AND CALLED VALUE.  Glucose, capillary     Status: Abnormal   Collection Time: 09/17/17  8:19 PM  Result Value Ref Range   Glucose-Capillary 222 (H) 65 - 99 mg/dL  Troponin I     Status: Abnormal   Collection Time: 09/18/17 12:32 AM  Result Value Ref Range  Troponin I 0.04 (HH) <0.03 ng/mL    Comment: CRITICAL VALUE NOTED.  VALUE IS CONSISTENT WITH PREVIOUSLY REPORTED AND CALLED VALUE.   Magnesium     Status: Abnormal   Collection Time: 09/18/17  5:02 AM  Result Value Ref Range   Magnesium 1.6 (L) 1.7 - 2.4 mg/dL  Comprehensive metabolic panel     Status: Abnormal   Collection Time: 09/18/17  5:02 AM  Result Value Ref Range   Sodium 129 (L) 135 - 145 mmol/L   Potassium 4.1 3.5 - 5.1 mmol/L   Chloride 98 (L) 101 - 111 mmol/L   CO2 24 22 - 32 mmol/L   Glucose, Bld 120 (H) 65 - 99 mg/dL   BUN 50 (H) 6 - 20 mg/dL   Creatinine, Ser 3.35 (H) 0.61 - 1.24 mg/dL   Calcium 7.5 (L) 8.9 - 10.3 mg/dL   Total Protein 5.6 (L) 6.5 - 8.1 g/dL   Albumin 2.8 (L) 3.5 - 5.0 g/dL   AST 161 (H) 15 - 41 U/L   ALT 341 (H) 17 - 63 U/L   Alkaline Phosphatase 135 (H) 38 - 126 U/L   Total Bilirubin 3.6 (H) 0.3 - 1.2 mg/dL   GFR calc non Af Amer 16 (L) >60 mL/min   GFR calc Af Amer 18 (L) >60 mL/min    Comment: (NOTE) The eGFR has been calculated using the CKD EPI equation. This calculation has not been validated in all clinical situations. eGFR's persistently <60 mL/min signify possible Chronic Kidney Disease.    Anion gap 7 5 - 15  CBC WITH DIFFERENTIAL     Status: Abnormal   Collection Time: 09/18/17  5:02 AM  Result Value Ref Range   WBC 18.7 (H) 4.0 - 10.5 K/uL    Comment: WHITE COUNT CONFIRMED ON SMEAR   RBC 4.29 4.22 - 5.81 MIL/uL   Hemoglobin 13.5 13.0 - 17.0 g/dL   HCT 39.6 39.0 - 52.0 %   MCV 92.3 78.0 - 100.0 fL   MCH 31.5 26.0 - 34.0 pg   MCHC 34.1 30.0 - 36.0 g/dL   RDW 13.7 11.5 - 15.5 %   Platelets 107 (L) 150 - 400 K/uL    Comment: SPECIMEN CHECKED FOR CLOTS PLATELET COUNT CONFIRMED BY SMEAR    Neutrophils Relative % 86 %   Lymphocytes Relative 8 %   Monocytes Relative 6 %   Eosinophils Relative 0 %   Basophils Relative 0 %   Neutro Abs 16.1 (H) 1.7 - 7.7 K/uL   Lymphs Abs 1.5 0.7 - 4.0 K/uL   Monocytes Absolute 1.1 (H) 0.1 - 1.0 K/uL   Eosinophils Absolute 0.0 0.0 - 0.7 K/uL   Basophils Absolute 0.0 0.0 - 0.1 K/uL   WBC Morphology MILD LEFT SHIFT (1-5%  METAS, OCC MYELO, OCC BANDS)     Comment: TOXIC GRANULATION  APTT     Status: Abnormal   Collection Time: 09/18/17  5:02 AM  Result Value Ref Range   aPTT 37 (H) 24 - 36 seconds    Comment:        IF BASELINE aPTT IS ELEVATED, SUGGEST PATIENT RISK ASSESSMENT BE USED TO DETERMINE APPROPRIATE ANTICOAGULANT THERAPY.   Protime-INR     Status: Abnormal   Collection Time: 09/18/17  5:02 AM  Result Value Ref Range   Prothrombin Time 21.3 (H) 11.4 - 15.2 seconds   INR 1.86   Troponin I     Status: Abnormal   Collection Time: 09/18/17  5:02 AM  Result Value Ref Range   Troponin I 0.04 (HH) <0.03 ng/mL    Comment: CRITICAL VALUE NOTED.  VALUE IS CONSISTENT WITH PREVIOUSLY REPORTED AND CALLED VALUE.  Glucose, capillary     Status: None   Collection Time: 09/18/17  8:13 AM  Result Value Ref Range   Glucose-Capillary 91 65 - 99 mg/dL  Glucose, capillary     Status: Abnormal   Collection Time: 09/18/17 11:05 AM  Result Value Ref Range   Glucose-Capillary 147 (H) 65 - 99 mg/dL  Glucose, capillary     Status: Abnormal   Collection Time: 09/18/17  5:00 PM  Result Value Ref Range   Glucose-Capillary 132 (H) 65 - 99 mg/dL   Comment 1 Notify RN    Comment 2 Document in Chart   Glucose, capillary     Status: Abnormal   Collection Time: 09/18/17  9:59 PM  Result Value Ref Range   Glucose-Capillary 149 (H) 65 - 99 mg/dL  Urinalysis, Routine w reflex microscopic     Status: Abnormal   Collection Time: 09/19/17  4:37 AM  Result Value Ref Range   Color, Urine YELLOW YELLOW   APPearance CLEAR CLEAR   Specific Gravity, Urine 1.004 (L) 1.005 - 1.030   pH 6.0 5.0 - 8.0   Glucose, UA NEGATIVE NEGATIVE mg/dL   Hgb urine dipstick SMALL (A) NEGATIVE   Bilirubin Urine NEGATIVE NEGATIVE   Ketones, ur NEGATIVE NEGATIVE mg/dL   Protein, ur NEGATIVE NEGATIVE mg/dL   Nitrite NEGATIVE NEGATIVE   Leukocytes, UA NEGATIVE NEGATIVE   RBC / HPF 0-5 0 - 5 RBC/hpf   WBC, UA 0-5 0 - 5 WBC/hpf   Bacteria, UA  NONE SEEN NONE SEEN   Squamous Epithelial / LPF NONE SEEN NONE SEEN   Mucus PRESENT   Glucose, capillary     Status: None   Collection Time: 09/19/17  7:48 AM  Result Value Ref Range   Glucose-Capillary 88 65 - 99 mg/dL   Comment 1 Notify RN   Glucose, capillary     Status: Abnormal   Collection Time: 09/19/17 12:08 PM  Result Value Ref Range   Glucose-Capillary 155 (H) 65 - 99 mg/dL   Comment 1 Notify RN     Ct Abdomen Pelvis Wo Contrast  Result Date: 09/17/2017 CLINICAL DATA:  84 year old male with abdominal pain, nausea vomiting this morning. Difficulty urinating, dark colored urine. EXAM: CT ABDOMEN AND PELVIS WITHOUT CONTRAST TECHNIQUE: Multidetector CT imaging of the abdomen and pelvis was performed following the standard protocol without IV contrast. COMPARISON:  CT Abdomen and Pelvis 02/03/2014. FINDINGS: Lower chest: Cardiomegaly. Calcified coronary artery atherosclerosis. Prior sternotomy. No pericardial effusion. Chronic pulmonary architectural distortion in the right lower lobe. Superimposed chronic elevation of the right hemidiaphragm, with right middle and lower lobe atelectasis. Superimposed other mild dependent pulmonary atelectasis. No pleural effusion. Hepatobiliary: Cholelithiasis (series 2, image 12). No pericholecystic inflammation. Negative visible noncontrast CT. Pancreas: Chronic pancreatic atrophy. Spleen: Negative. Adrenals/Urinary Tract: Normal adrenal glands. Chronic bilateral perinephric stranding which is nonspecific. No hydronephrosis. Both ureters are decompressed. No urologic calculus identified. Decompressed and unremarkable urinary bladder. No perivesical stranding. Stomach/Bowel: Retained low-density stool in the rectum and distal sigmoid colon. Chronic sigmoid redundancy with severe diverticulosis throughout the mid and proximal sigmoid. Difficult to exclude mild inflammation at the junction of the descending and sigmoid colon (coronal image 47), although this is  similar appearance to the 2015 CT. Severe diverticulosis continues in the left colon and throughout the transverse colon. No  active inflammation. Oral contrast has reached the cecum which is on a lax mesentery in the anterior right abdomen. Negative cecum and appendix. Negative terminal ileum. No dilated small bowel. Moderately distended stomach with oral contrast. Negative duodenum. No abdominal free fluid or free air. Vascular/Lymphatic: Extensive Aortoiliac calcified atherosclerosis. Vascular patency is not evaluated in the absence of IV contrast. No lymphadenopathy. Reproductive: Negative. Other: No pelvic free fluid. Musculoskeletal: Advanced lumbar spine degeneration. Chronic lumbar spinal stenosis. No acute osseous abnormality identified. IMPRESSION: 1. Extensive large bowel diverticulosis from the hepatic flexure to the sigmoid. Difficult to exclude mild acute diverticulitis at the junction of the descending and sigmoid colon in the left lower quadrant, but the appearance is stable to a 2015 CT. 2. Cholelithiasis.  No CT evidence of acute cholecystitis. 3. No other acute or inflammatory findings identified in the abdomen or pelvis. Normal appendix. 4. Aortic Atherosclerosis (ICD10-I70.0). Electronically Signed   By: Genevie Ann M.D.   On: 09/17/2017 14:51   Dg Chest 2 View  Result Date: 09/17/2017 CLINICAL DATA:  84 year old male with history respiratory distress. Evaluate for pneumonia. EXAM: CHEST  2 VIEW COMPARISON:  Chest x-ray 07/10/2015. FINDINGS: Lung volumes are extremely low. Chronic elevation of the right hemidiaphragm. Bibasilar opacities are slightly to represent areas of subsegmental atelectasis. No definite consolidative airspace disease. No pleural effusions. No evidence of pulmonary edema. Heart size is normal. Upper mediastinal contours are distorted by patient positioning. Aortic atherosclerosis. Status post median sternotomy for CABG mitral annuloplasty. Left-sided pacemaker/AICD device in  place with lead tips projecting over the expected location of the right ventricular apex. IMPRESSION: 1. Low lung volumes with probable bibasilar subsegmental atelectasis. No radiographic evidence of acute cardiopulmonary disease. 2. Chronic elevation of the right hemidiaphragm is unchanged. 3. Aortic atherosclerosis. Electronically Signed   By: Vinnie Langton M.D.   On: 09/17/2017 14:41    Review of Systems  Constitutional: Positive for chills and malaise/fatigue. Negative for fever and weight loss.       He says he goes to [facility] regularly  HENT: Positive for hearing loss.   Eyes: Negative.   Respiratory: Negative.   Cardiovascular: Negative.   Gastrointestinal: Positive for abdominal pain, constipation, heartburn, nausea and vomiting. Negative for blood in stool, diarrhea and melena.  Genitourinary: Negative.        Decreased urine output  Musculoskeletal: Positive for back pain (chronic - limits mobility).  Skin: Negative.        Some ecchymosis with IV sticks  Neurological: Positive for weakness and headaches (occasional headache).  Endo/Heme/Allergies: Bruises/bleeds easily.  Psychiatric/Behavioral: Negative.    Blood pressure 137/62, pulse (!) 55, temperature 97.8 F (36.6 C), temperature source Oral, resp. rate 18, height _0  (1.956 m), weight 108 kg (238 lb), SpO2 100 %. Physical Exam  Constitutional: He is oriented to person, place, and time. He appears well-developed and well-nourished. No distress.  HENT:  Head: Normocephalic and atraumatic.  Mouth/Throat: Oropharynx is clear and moist. No oropharyngeal exudate.  Eyes: Right eye exhibits no discharge. Left eye exhibits no discharge. No scleral icterus.  Pupils are equal  Neck: Normal range of motion. Neck supple. No JVD present. No tracheal deviation present. No thyromegaly present.  Respiratory: Effort normal and breath sounds normal. No respiratory distress. He has no wheezes. He has no rales. He exhibits no tenderness.   Mediastinotomy scar  AICD Left upper chest  GI: Soft. Bowel sounds are normal. He exhibits no distension and no mass. There is no tenderness. There  is no rebound and no guarding.  currently pain free  Musculoskeletal: He exhibits no edema or tenderness.  Lymphadenopathy:    He has no cervical adenopathy.  Neurological: He is alert and oriented to person, place, and time. No cranial nerve deficit.  Skin: Skin is warm and dry. No rash noted. He is not diaphoretic. No erythema. No pallor.  Psychiatric: He has a normal mood and affect. His behavior is normal. Judgment and thought content normal.    Assessment/Plan: Cholangitis  Cholelithiasis CAD/CABG/MVR/Cardiomyopathy last EF 25%/AICD Atrial fibrillation  Chronic anticoagulation - Eliquis  09/17/17 Chronic kidney disease  PVOD Chronic back pain - limits mobility some Hypertension   Plan:  We will let the Eliquis wear off, await cardiology evaluation and when clear plan cholecystectomy.  Recheck labs in AM.  Earnstine Regal 09/19/2017, 12:45 PM

## 2017-09-19 NOTE — Progress Notes (Signed)
PHARMACY - PHYSICIAN COMMUNICATION CRITICAL VALUE ALERT - BLOOD CULTURE IDENTIFICATION (BCID)  Wayne Gordon is an 84 y.o. male with cholelithiasis  Assessment:  Blood cultures growing Klebsiella pneumoniae (previously read as Gram Positive Rods)  Name of physician (or Provider) Contacted: Craige Cotta  Current antibiotics: Vancomycin and Cefepime and Flagyl  Changes to prescribed antibiotics recommended:  Recommend changing to Rocephin 2 g IV q24h  Results for orders placed or performed during the hospital encounter of 09/17/17  Blood Culture ID Panel (Reflexed) (Collected: 09/17/2017 11:18 AM)  Result Value Ref Range   Enterococcus species NOT DETECTED NOT DETECTED   Listeria monocytogenes NOT DETECTED NOT DETECTED   Staphylococcus species NOT DETECTED NOT DETECTED   Staphylococcus aureus NOT DETECTED NOT DETECTED   Streptococcus species NOT DETECTED NOT DETECTED   Streptococcus agalactiae NOT DETECTED NOT DETECTED   Streptococcus pneumoniae NOT DETECTED NOT DETECTED   Streptococcus pyogenes NOT DETECTED NOT DETECTED   Acinetobacter baumannii NOT DETECTED NOT DETECTED   Enterobacteriaceae species DETECTED (A) NOT DETECTED   Enterobacter cloacae complex NOT DETECTED NOT DETECTED   Escherichia coli NOT DETECTED NOT DETECTED   Klebsiella oxytoca NOT DETECTED NOT DETECTED   Klebsiella pneumoniae DETECTED (A) NOT DETECTED   Proteus species NOT DETECTED NOT DETECTED   Serratia marcescens NOT DETECTED NOT DETECTED   Carbapenem resistance NOT DETECTED NOT DETECTED   Haemophilus influenzae NOT DETECTED NOT DETECTED   Neisseria meningitidis NOT DETECTED NOT DETECTED   Pseudomonas aeruginosa NOT DETECTED NOT DETECTED   Candida albicans NOT DETECTED NOT DETECTED   Candida glabrata NOT DETECTED NOT DETECTED   Candida krusei NOT DETECTED NOT DETECTED   Candida parapsilosis NOT DETECTED NOT DETECTED   Candida tropicalis NOT DETECTED NOT DETECTED    Wayne Gordon 09/19/2017  6:43 AM

## 2017-09-19 NOTE — Progress Notes (Signed)
Pt transported via [facility] to [facility]. Patient stable, vitals stable, alert & oriented x4. Report given to [facility] team and Manson Passey, RN on unit 6N.

## 2017-09-19 NOTE — Progress Notes (Signed)
ANTIBIOTIC CONSULT NOTE-Preliminary  Pharmacy Consult for Vancomycin Indication: Bacteremia  Patient Measurements: Height: 6\' 5"  (195.6 cm) Weight: 252 lb 10.4 oz (114.6 kg) IBW/kg (Calculated) : 89.1  Vital Signs: Temp: 98.3 F (36.8 C) (01/21 2156) Temp Source: Oral (01/21 2156) BP: 101/49 (01/21 2156) Pulse Rate: 53 (01/21 2156)  Labs: Recent Labs    09/17/17 1002 09/18/17 0502  WBC 28.8* 18.7*  HGB 15.1 13.5  PLT 123* 107*  CREATININE 3.44* 3.35*    Estimated Creatinine Clearance: 24.3 mL/min (A) (by C-G formula based on SCr of 3.35 mg/dL (H)).  No results for input(s): VANCOTROUGH, VANCOPEAK, VANCORANDOM, GENTTROUGH, GENTPEAK, GENTRANDOM, TOBRATROUGH, TOBRAPEAK, TOBRARND, AMIKACINPEAK, AMIKACINTROU, AMIKACIN in the last 72 hours.   Microbiology: Recent Results (from the past 720 hour(s))  Blood culture (routine x 2)     Status: None (Preliminary result)   Collection Time: 09/17/17 11:12 AM  Result Value Ref Range Status   Specimen Description RIGHT ANTECUBITAL  Final   Special Requests   Final    BOTTLES DRAWN AEROBIC AND ANAEROBIC Blood Culture adequate volume   Culture NO GROWTH < 24 HOURS  Final   Report Status PENDING  Incomplete  Blood culture (routine x 2)     Status: None (Preliminary result)   Collection Time: 09/17/17 11:18 AM  Result Value Ref Range Status   Specimen Description BLOOD RIGHT HAND  Final   Special Requests   Final    BOTTLES DRAWN AEROBIC AND ANAEROBIC Blood Culture results may not be optimal due to an inadequate volume of blood received in culture bottles   Culture  Setup Time   Final    GRAM POSITIVE RODS ANAEROBIC BOTTLE ONLY Gram Stain Report Called to,Read Back By and Verified With: HARRIS,B @ 0020 ON 1.22.19 BY BOWMAN,L    Culture PENDING  Incomplete   Report Status PENDING  Incomplete    Medical History: Past Medical History:  Diagnosis Date  . Allergy to ACE inhibitors    Angioedema many years ago; patient has tolerated  Losartan (ARB) in the past - it was held during [facility] for GI bleed and worsening renal function >> resume Losartan 25 mg QD 06/2015  . Arthritis   . Atrial fibrillation (HCC)    PAF, CHADs2Vasc = 5  . Carotid artery disease (HCC)    60-79% LICA  . CHF (congestive heart failure) (HCC)   . Coronary artery disease   . Diabetes mellitus   . GERD (gastroesophageal reflux disease)   . Hyperlipidemia   . Hypertension   . ICD (implantable cardiac defibrillator) in place 03/28/2012   Biotronik, Dr. Ladona Ridgel 03/28/12  . Ischemic cardiomyopathy    S/P CABG x 3; EF 25%  . Mitral regurgitation    S/P mitral valve repair 2013  . Myocardial infarction (HCC)   . Renal insufficiency     Medications:  Cefepime 1/20>> Vancomycin 2000 mg IV x 1 dose 1/20  Assessment: 84 yo male admitted 1/20 with sepsis; now with blood culture positive for Gram Positive rods in 1 anaerobic bottle. Pt has CKD with est CrCl <30 ml/min. Pharmacy has been consulted for vancomycin dosing.  Goal of Therapy:  Vancomycin troughs 15-20 mcg/ml  Plan:  Preliminary review of pertinent patient information completed.  Protocol will be initiated with dose of 1500 mg IV vancomycin 48 hr after his initial dose.Pattricia Boss [facility] clinical pharmacist will complete review during morning rounds to assess patient and finalize treatment regimen if needed.  Arelia Sneddon, [facility] 09/19/2017,2:03  AM

## 2017-09-19 NOTE — Progress Notes (Signed)
PHARMACY - PHYSICIAN COMMUNICATION CRITICAL VALUE ALERT - BLOOD CULTURE IDENTIFICATION (BCID)  Blayne Duba Hechavarria is an 84 y.o. male who presented to [facility] on 09/17/2017 with a sepsis.  Assessment:  Suspected intra-abdominal infection with surgical consult  Name of physician (or Provider) Contacted:  Attempted contact with Jimmye Norman, NP; Attempted contact with Dr. Eliezer Bottom.  Current antibiotics:  Cefepime 1/20>> Vancomycin 2000 mg x 1 dose 1/20  Changes to prescribed antibiotics recommended:  Vancomycin per pharmacy dosing ordered  No results found for this or any previous visit.  Arelia Sneddon 09/19/2017  2:15 AM

## 2017-09-19 NOTE — Progress Notes (Signed)
Hospitalist progress note   Wayne Gordon  TFT:[id number] DOB: Oct 14, 1933 DOA: 09/17/2017 PCP: Octavio Graves, DO   Specialists:   Brief Narrative:  80 m , CABG 2/82013+ MV repair, chronic systolic heart failure last EF 35-40%-class II 06/2015,-ICD biotronik 03/28/12 2/2 Isch CM, HTN, DM 2, reflux, prior diverticulosis resulting in holding Eliquis in the past, hiatal hernia on EGD 03/2014-Colonoscopy at that time diverticula Initially admitted [facility] 1/20 right upper quadrant pain nausea Reiger and was hypotensive given IV fluid boluses within WBC of 28 abdominal ultrasound showed cholelithiasis-alk phos 184 AST ALT 382/560 9T bili 6.8 POC troponin 0 0.06 lactic acid 2.3 Seen by general surgery felt to have cholangitis-seen by GI felt to have passed choledocho stone-family inquired about need for operative management of stones patient transferred to [facility]:   Assessment:  The primary encounter diagnosis was Cholangitis. Diagnoses of Abdominal pain and Sepsis, due to unspecified organism [facility]) were also pertinent to this visit.  Sepsis secondary to K pNA cholangitis-continue empiric ceftriaxone Flagyl--stopping flagyll today, pain control morphine for severe pain 1 mg every 3 as needed hydrocodone 1-2 every 4 as needed-is able to tolerate p.o. MRCP was unable to be obtained, ERCP was deferred Probable cholecystitis-we will consult general surgery once cardiac clearance is obtained --was treating this with Papain capsules at home--havementioned will probably need a couple of days to be readied for surgery and get cholangitis rx before decision re: surgery Ischemic cardiomyopathy status post PPM Biotronik complicated by some bradycardia-continue Coreg 6.25 twice daily (home dose is 12.5)-holding Lasix 20, holding apixaban for now--place on Lovenox for now until surgery which can be held day of surgery Diabetes mellitus type 2-resume home dosages of long-acting  insulin with sliding scale coverage-sugars are 88-149 AKA superimposed on CKD 3-baseline BUN/creatinine 23/1.9 on admission 41/3.4, currently 50/3.3-may need further hydration going forward prior to surgery Mild hyponatremia-possibly secondary to excessive p.o. fluids-monitor labs in a.m.  DVT prophylaxis: loveneox tx  Code Status:   full   Family Communication:    D/w wife, realtive  Disposition Plan: inpatient   Consultants:    cardiology  surgery  Procedures:   None yet  Antimicrobials:   Ceftriaxone 1/20  flagyll 1/20 --1/22  Subjective:  Awake alert tol diet no abd pain no n/v. No cp--can walke up 1-2 flights of stairs-active at baseline,  Passing stool  Objective: Vitals:   09/18/17 0500 09/18/17 1717 09/18/17 2156 09/19/17 0445  BP: (!) 105/46 (!) 108/43 (!) 101/49 137/62  Pulse: (!) 55 (!) 54 (!) 53 (!) 55  Resp: _0 Temp: 97.7 F (36.5 C) 97.7 F (36.5 C) 98.3 F (36.8 C) 97.8 F (36.6 C)  TempSrc: Oral Oral Oral Oral  SpO2: 97% 96% 96% 100%  Weight:    108 kg (238 lb)  Height:    _1  (1.956 m)    Intake/Output Summary (Last 24 hours) at 09/19/2017 0806 Last data filed at 09/19/2017 0750 Gross per 24 hour  Intake 1565 ml  Output 1450 ml  Net 115 ml   Filed Weights   09/17/17 0952 09/17/17 1649 09/19/17 0445  Weight: 107.5 kg (237 lb) 114.6 kg (252 lb 10.4 oz) 108 kg (238 lb)    Examination:  eomi about stated age in nad cta b Ppm in place tele benign abd soft no uq tender Neuro intact   Data Reviewed: I have personally reviewed following labs and imaging studies  CBC: Recent  Labs  Lab 09/17/17 1002 09/18/17 0502  WBC 28.8* 18.7*  NEUTROABS 25.7* 16.1*  HGB 15.1 13.5  HCT 44.9 39.6  MCV 94.3 92.3  PLT 123* 185*   Basic Metabolic Panel: Recent Labs  Lab 09/17/17 1002 09/18/17 0502  NA 132* 129*  K 4.6 4.1  CL 95* 98*  CO2 23 24  GLUCOSE 156* 120*  BUN 41* 50*  CREATININE 3.44* 3.35*  CALCIUM 8.6* 7.5*  MG   --  1.6*   GFR: Estimated Creatinine Clearance: 23.7 mL/min (A) (by C-G formula based on SCr of 3.35 mg/dL (H)). Liver Function Tests: Recent Labs  Lab 09/17/17 1002 09/18/17 0502  AST 382* 161*  ALT 569* 341*  ALKPHOS 184* 135*  BILITOT 6.8* 3.6*  PROT 6.6 5.6*  ALBUMIN 3.3* 2.8*   Recent Labs  Lab 09/17/17 1006  LIPASE 23   No results for input(s): AMMONIA in the last 168 hours. Coagulation Profile: Recent Labs  Lab 09/18/17 0502  INR 1.86   Cardiac Enzymes: Recent Labs  Lab 09/17/17 1002 09/17/17 1853 09/18/17 0032 09/18/17 0502  TROPONINI 0.06* 0.04* 0.04* 0.04*   CBG: Recent Labs  Lab 09/18/17 0813 09/18/17 1105 09/18/17 1700 09/18/17 2159 09/19/17 0748  GLUCAP 91 147* 132* 149* 88   Urine analysis:    Component Value Date/Time   COLORURINE YELLOW 09/19/2017 [place] 09/19/2017 0437   LABSPEC 1.004 (L) 09/19/2017 0437   PHURINE 6.0 09/19/2017 0437   GLUCOSEU NEGATIVE 09/19/2017 0437   HGBUR SMALL (A) 09/19/2017 0437   BILIRUBINUR NEGATIVE 09/19/2017 [place] 09/19/2017 0437   PROTEINUR NEGATIVE 09/19/2017 0437   UROBILINOGEN 0.2 02/04/2014 0939   NITRITE NEGATIVE 09/19/2017 0437   LEUKOCYTESUR NEGATIVE 09/19/2017 0437     Radiology Studies: Reviewed images personally in health database    Scheduled Meds: . carvedilol  6.25 mg Oral BID WC  . feeding supplement (GLUCERNA SHAKE)  237 mL Oral TID BM  . insulin aspart  0-9 Units Subcutaneous TID WC  . insulin glargine  12 Units Subcutaneous Daily  . pantoprazole  40 mg Oral Daily   Continuous Infusions: . sodium chloride 75 mL/hr (09/19/17 0610)  . cefTRIAXone (ROCEPHIN)  IV    . metronidazole Stopped (09/19/17 0304)     LOS: 2 days    Time spent: Dix, MD [facility]   If 7PM-7AM, please contact night-coverage [email or web address] Password [facility] 09/19/2017, 8:06 AM

## 2017-09-19 NOTE — Consult Note (Signed)
Cardiology Consultation:   Patient ID: Wayne Gordon; [id number]; 1934/01/07   Admit date: 09/17/2017 Date of Consult: 09/19/2017  Primary Care Provider: Samuel Jester, DO Primary Cardiologist: Dr. Lewayne Bunting   Patient Profile:   Wayne Gordon is an 84 y.o. male with PMH CAD s/p CABG (09/2011), ICM (last EF 35-40% 06/2015) chronic systolic CHF, CHB s/p PPM, HTN, atrial fibrillation on eliquis, CKD, and DM who presented with RUQ abdominal pain and is being seen today for pre-operative evaluation at the request of Dr. Mahala Menghini.  History of Present Illness:   Wayne Gordon is an 84 y.o. male with PMH CAD s/p CABG (09/2011), ICM with EF 25%, CHB s/p ICD, s/p MVR, HTN, atrial fibrillation, CKD, and DM who presented to [facility] with RUQ abdominal pain, nausea, and rigors on 09/17/17. States he was in his usual state of health until a few days ago when he noticed RUQ abdominal pain. He reports intermittent issues with his gallbladder over the past 4 years. He was transferred from [facility] to [facility] for further management of cholangitis with consideration for cholecystectomy by surgery.   Patient states he has been doing well from a cardiac standpoint. He is limited in mobility by back pain. He is able to ambulate up a flight of stairs without SOB/CP. Denies CP with activity, DOE, orthopnea, PND, LE edema, weight changes, palpitations, or ICD shocks.   Hospital course:  Afebrile, hypotensive. WBC 28. Transaminitis. Trop 0.06>0.04 x3. Lactate +2.3 >1.9.  Abd [place] with signs of cholelithiasis. CT A/P with cholelithiasis, no evidence of acute cholecysitis. Unable to obtain MRI given ICD. Admitted to medicine for sepsis/cholangitis and started on IV abx. GI consulted recommending no need for ERCP. Surgery consulted recommending transfer to [facility] for cardiology support for possible cholecystectomy.   Past Medical History:  Diagnosis Date  . Allergy to ACE inhibitors    Angioedema many years ago; patient  has tolerated Losartan (ARB) in the past - it was held during [facility] for GI bleed and worsening renal function >> resume Losartan 25 mg QD 06/2015  . Arthritis   . Atrial fibrillation (HCC)    PAF, CHADs2Vasc = 5  . Carotid artery disease (HCC)    60-79% LICA  . CHF (congestive heart failure) (HCC)   . Coronary artery disease   . Diabetes mellitus   . GERD (gastroesophageal reflux disease)   . Hyperlipidemia   . Hypertension   . ICD (implantable cardiac defibrillator) in place 03/28/2012   [place]ron[place], Dr. Ladona Ridgel 03/28/12  . Ischemic cardiomyopathy    S/P CABG x 3; EF 25%  . Mitral regurgitation    S/P mitral valve repair 2013  . Myocardial infarction (HCC)   . Renal insufficiency     Past Surgical History:  Procedure Laterality Date  . COLONOSCOPY N/A 04/24/2014   RMR Pancolonic diverticulosis  . CORONARY ARTERY BYPASS GRAFT  10/17/2011   Procedure: CORONARY ARTERY BYPASS GRAFTING (CABG);  Surgeon: Purcell Nails, MD;  Location: [facility] OR;  Service: Open Heart Surgery;  Laterality: N/A;  . ESOPHAGOGASTRODUODENOSCOPY N/A 04/24/2014   RMR Subtle nodularity the gastric mucosa of uncertain significance-status post biopsy. Hiatal hernia. chronic inflammation, no H.pylori  . ESOPHAGOGASTRODUODENOSCOPY N/A 06/24/2016   Dr. Jena Gauss: normal esophagus, small hiatal hernia, normal duodenum  . ICD  03/28/2012  . IMPLANTABLE CARDIOVERTER DEFIBRILLATOR IMPLANT N/A 03/28/2012   Procedure: IMPLANTABLE CARDIOVERTER DEFIBRILLATOR IMPLANT;  Surgeon: Marinus Maw, MD;  Location: [facility] CATH LAB;  Service: Cardiovascular;  Laterality: N/A;  .  KNEE ARTHROSCOPY  ~ 2008   right  . LEFT AND RIGHT HEART CATHETERIZATION WITH CORONARY ANGIOGRAM N/A 10/12/2011   Procedure: LEFT AND RIGHT HEART CATHETERIZATION WITH CORONARY ANGIOGRAM;  Surgeon: Iran Ouch, MD;  Location: [facility] CATH LAB;  Service: Cardiovascular;  Laterality: N/A;  . MITRAL VALVE REPAIR  10/17/2011   Procedure: MITRAL VALVE REPAIR (MVR);  Surgeon:  Purcell Nails, MD;  Location: [facility] OR;  Service: Open Heart Surgery;  Laterality: N/A;  . NASAL SINUS SURGERY  1990's   right     Home Medications:  Prior to Admission medications   Medication Sig Start Date End Date Taking? Authorizing Provider  apixaban (ELIQUIS) 5 MG TABS tablet Take 1 tablet (5 mg total) 2 (two) times daily by mouth. 07/11/17  Yes Marinus Maw, MD  CALCIUM-MAGNESIUM-ZINC PO Take 2 tablets by mouth daily.   Yes [provider]  carvedilol (COREG) 6.25 MG tablet Take 1 tablet (6.25 mg total) by mouth 2 (two) times daily. Patient taking differently: Take 12.5 mg by mouth 2 (two) times daily.  12/30/16  Yes Marinus Maw, MD  furosemide (LASIX) 20 MG tablet Take 20 mg tablet by month (1 tablet) alternating with 40 mg (2 tablets) every other day. 05/23/16  Yes Leone Brand, NP  glipiZIDE (GLUCOTROL XL) 10 MG 24 hr tablet Take 10 mg by mouth 2 times daily at 12 noon and 4 pm. Pt takes it in the AM and PM   Yes [provider]  hydrOXYzine (ATARAX/VISTARIL) 25 MG tablet Take 1 tablet by mouth 3 (three) times daily. 09/09/17  Yes [provider]  Insulin Glargine (TOUJEO SOLOSTAR) 300 UNIT/ML SOPN Inject 12-15 Units as directed daily.  04/27/15  Yes [provider]  losartan (COZAAR) 25 MG tablet Take 1 tablet (25 mg total) by mouth daily. 07/11/15  Yes Weaver, Scott T, PA-C  Multiple Vitamins-Minerals (MULTIVITAMINS THER. W/MINERALS) TABS Take 1 tablet by mouth daily.   Yes [provider]  pantoprazole (PROTONIX) 40 MG tablet Take 1 tablet by mouth daily. 01/03/17  Yes [provider]  rosuvastatin (CRESTOR) 20 MG tablet Take 20 mg by mouth every evening.    Yes [provider]  testosterone cypionate (DEPO-TESTOSTERONE) 200 MG/ML injection Inject 0.5 mg into the muscle every 21 ( twenty-one) days. INJECT 0.6ML- 0.8ML EVERY 2 WEEKS 08/13/14  Yes [provider]    Inpatient Medications: Scheduled Meds: .  carvedilol  6.25 mg Oral BID WC  . feeding supplement (GLUCERNA SHAKE)  237 mL Oral TID BM  . insulin aspart  0-9 Units Subcutaneous TID WC  . insulin glargine  12 Units Subcutaneous Daily  . pantoprazole  40 mg Oral Daily   Continuous Infusions: . sodium chloride 75 mL/hr (09/19/17 0610)  . cefTRIAXone (ROCEPHIN)  IV Stopped (09/19/17 0959)   PRN Meds: acetaminophen **OR** acetaminophen, HYDROcodone-acetaminophen, hydrOXYzine, morphine injection, ondansetron **OR** ondansetron (ZOFRAN) IV  Allergies:    Penicillin - palpitations Ace Inhibitors - swelling  Social History:   Social History   Socioeconomic History  . Marital status: Married    Spouse name: Not on file  . Number of children: Not on file  . Years of education: Not on file  . Highest education level: Not on file  Social Needs  . Financial resource strain: Not on file  . Food insecurity - worry: Not on file  . Food insecurity - inability: Not on file  . Transportation needs - medical: Not on file  .  Transportation needs - non-medical: Not on file  Occupational History  . Occupation: Retired  Tobacco Use  . Smoking status: Never Smoker  . Smokeless tobacco: Never Used  Substance and Sexual Activity  . Alcohol use: No    Alcohol/week: 0.0 oz  . Drug use: No  . Sexual activity: Yes  Other Topics Concern  . Not on file  Social History Narrative  . Not on file    Family History:    Family History  Problem Relation Age of Onset  . Heart disease Mother   . Diabetes Father   . Cardiomyopathy Father   . Colon cancer Neg Hx      ROS:  Please see the history of present illness.   All other ROS reviewed and negative.     Physical Exam/Data:   Vitals:   09/18/17 0500 09/18/17 1717 09/18/17 2156 09/19/17 0445  BP: (!) 105/46 (!) 108/43 (!) 101/49 137/62  Pulse: (!) 55 (!) 54 (!) 53 (!) 55  Resp: 18 20 18 18   Temp: 97.7 F (36.5 C) 97.7 F (36.5 C) 98.3 F (36.8 C) 97.8 F (36.6 C)  TempSrc: Oral  Oral Oral Oral  SpO2: 97% 96% 96% 100%  Weight:    238 lb (108 kg)  Height:    6\' 5"  (1.956 m)    Intake/Output Summary (Last 24 hours) at 09/19/2017 1312 Last data filed at 09/19/2017 1144 Gross per 24 hour  Intake 1670 ml  Output 1200 ml  Net 470 ml   Filed Weights   09/17/17 0952 09/17/17 1649 09/19/17 0445  Weight: 237 lb (107.5 kg) 252 lb 10.4 oz (114.6 kg) 238 lb (108 kg)   Body mass index is 28.22 kg/m.  General:  Well nourished, well developed, in no acute distress HEENT: sclera anicteric  Neck: no JVD Vascular: No carotid bruits; distal pulses 2+ bilaterally Cardiac:  normal S1, S2; RRR; no murmurs, gallops, or rubs Lungs:  clear to auscultation bilaterally, no wheezing, rhonchi or rales  Abd: NABS, soft, nontender, no hepatomegaly Ext: no edema Musculoskeletal:  No deformities, BUE and BLE strength normal and equal Skin: warm and dry  Neuro:  CNs 2-12 intact, no focal abnormalities noted Psych:  Normal affect   EKG:  The EKG was personally reviewed and demonstrates:  Ventricular pacing Telemetry:  Telemetry was personally reviewed and demonstrates:  Ventricular pacing  Relevant CV Studies:  ECHO 06/2015: Study Conclusions  - Left ventricle: Global Longitudinal LV strain is -10.5% The   cavity size was mildly dilated. There was mild focal basal   hypertrophy of the septum. Systolic function was moderately   reduced. The estimated ejection fraction was in the range of 35%   to 40%. Diffuse hypokinesis. There is akinesis of the   basalinferior myocardium. There is akinesis of the   entireinferolateral myocardium. There is akinesis of the   basal-midlateral myocardium. - Aortic valve: Trileaflet; mildly thickened, mildly calcified   leaflets. There was trivial regurgitation. - Aorta: Aortic root dimension: 37 mm (ED). - Aortic root: The aortic root was trivially dilated. - Mitral valve: S/P MV repari with mitral valve ring. There was   mild regurgitation.  Valve area by continuity equation (using LVOT   flow): 1.6 cm^2. - Tricuspid valve: There was mild regurgitation. - Pulmonic valve: There was trivial regurgitation.  Laboratory Data:  Chemistry Recent Labs  Lab 09/17/17 1002 09/18/17 0502  NA 132* 129*  K 4.6 4.1  CL 95* 98*  CO2 23 24  GLUCOSE 156* 120*  BUN 41* 50*  CREATININE 3.44* 3.35*  CALCIUM 8.6* 7.5*  GFRNONAA 15* 16*  GFRAA 18* 18*  ANIONGAP 14 7    Recent Labs  Lab 09/17/17 1002 09/18/17 0502  PROT 6.6 5.6*  ALBUMIN 3.3* 2.8*  AST 382* 161*  ALT 569* 341*  ALKPHOS 184* 135*  BILITOT 6.8* 3.6*   Hematology Recent Labs  Lab 09/17/17 1002 09/18/17 0502  WBC 28.8* 18.7*  RBC 4.76 4.29  HGB 15.1 13.5  HCT 44.9 39.6  MCV 94.3 92.3  MCH 31.7 31.5  MCHC 33.6 34.1  RDW 13.9 13.7  PLT 123* 107*   Cardiac Enzymes Recent Labs  Lab 09/17/17 1002 09/17/17 1853 09/18/17 0032 09/18/17 0502  TROPONINI 0.06* 0.04* 0.04* 0.04*   No results for input(s): TROPIPOC in the last 168 hours.  BNPNo results for input(s): BNP, PROBNP in the last 168 hours.  DDimer No results for input(s): DDIMER in the last 168 hours.  Radiology/Studies:  Ct Abdomen Pelvis Wo Contrast  Result Date: 09/17/2017 CLINICAL DATA:  84 year old male with abdominal pain, nausea vomiting this morning. Difficulty urinating, dark colored urine. EXAM: CT ABDOMEN AND PELVIS WITHOUT CONTRAST TECHNIQUE: Multidetector CT imaging of the abdomen and pelvis was performed following the standard protocol without IV contrast. COMPARISON:  CT Abdomen and Pelvis 02/03/2014. FINDINGS: Lower chest: Cardiomegaly. Calcified coronary artery atherosclerosis. Prior sternotomy. No pericardial effusion. Chronic pulmonary architectural distortion in the right lower lobe. Superimposed chronic elevation of the right hemidiaphragm, with right middle and lower lobe atelectasis. Superimposed other mild dependent pulmonary atelectasis. No pleural effusion. Hepatobiliary:  Cholelithiasis (series 2, image 12). No pericholecystic inflammation. Negative visible noncontrast CT. Pancreas: Chronic pancreatic atrophy. Spleen: Negative. Adrenals/Urinary Tract: Normal adrenal glands. Chronic bilateral perinephric stranding which is nonspecific. No hydronephrosis. Both ureters are decompressed. No urologic calculus identified. Decompressed and unremarkable urinary bladder. No perivesical stranding. Stomach/Bowel: Retained low-density stool in the rectum and distal sigmoid colon. Chronic sigmoid redundancy with severe diverticulosis throughout the mid and proximal sigmoid. Difficult to exclude mild inflammation at the junction of the descending and sigmoid colon (coronal image 47), although this is similar appearance to the 2015 CT. Severe diverticulosis continues in the left colon and throughout the transverse colon. No active inflammation. Oral contrast has reached the cecum which is on a lax mesentery in the anterior right abdomen. Negative cecum and appendix. Negative terminal ileum. No dilated small bowel. Moderately distended stomach with oral contrast. Negative duodenum. No abdominal free fluid or free air. Vascular/Lymphatic: Extensive Aortoiliac calcified atherosclerosis. Vascular patency is not evaluated in the absence of IV contrast. No lymphadenopathy. Reproductive: Negative. Other: No pelvic free fluid. Musculoskeletal: Advanced lumbar spine degeneration. Chronic lumbar spinal stenosis. No acute osseous abnormality identified. IMPRESSION: 1. Extensive large bowel diverticulosis from the hepatic flexure to the sigmoid. Difficult to exclude mild acute diverticulitis at the junction of the descending and sigmoid colon in the left lower quadrant, but the appearance is stable to a 2015 CT. 2. Cholelithiasis.  No CT evidence of acute cholecystitis. 3. No other acute or inflammatory findings identified in the abdomen or pelvis. Normal appendix. 4. Aortic Atherosclerosis (ICD10-I70.0).  Electronically Signed   By: Odessa Fleming M.D.   On: 09/17/2017 14:51   Dg Chest 2 View  Result Date: 09/17/2017 CLINICAL DATA:  84 year old male with history respiratory distress. Evaluate for pneumonia. EXAM: CHEST  2 VIEW COMPARISON:  Chest x-ray 07/10/2015. FINDINGS: Lung volumes are extremely low. Chronic elevation of the right hemidiaphragm. Bibasilar opacities  are slightly to represent areas of subsegmental atelectasis. No definite consolidative airspace disease. No pleural effusions. No evidence of pulmonary edema. Heart size is normal. Upper mediastinal contours are distorted by patient positioning. Aortic atherosclerosis. Status post median sternotomy for CABG mitral annuloplasty. Left-sided pacemaker/AICD device in place with lead tips projecting over the expected location of the right ventricular apex. IMPRESSION: 1. Low lung volumes with probable bibasilar subsegmental atelectasis. No radiographic evidence of acute cardiopulmonary disease. 2. Chronic elevation of the right hemidiaphragm is unchanged. 3. Aortic atherosclerosis. Electronically Signed   By: Trudie Reed M.D.   On: 09/17/2017 14:41   US Abdomen Limited Ruq  Result Date: 09/17/2017 CLINICAL DATA:  Nausea and vomiting with upper abdominal pain EXAM: ULTRASOUND ABDOMEN LIMITED RIGHT UPPER QUADRANT COMPARISON:  Abdominal ultrasound Jan 12, 2017 FINDINGS: Gallbladder: Within the gallbladder, there are echogenic foci which shadow but do not convincingly move consistent with adherent gallstones. Largest apparent gallstone measures 10 mm. Gallbladder appears contracted. No gallbladder wall edema or pericholecystic fluid. No sonographic Murphy sign noted by sonographer. Common bile duct: Diameter: 4 mm. No intrahepatic or extrahepatic biliary duct dilatation. Liver: No focal lesion identified. Within normal limits in parenchymal echogenicity. Portal vein is patent on color Doppler imaging with normal direction of blood flow towards the liver.  IMPRESSION: Cholelithiasis with gallbladder somewhat contracted. No gallbladder wall edema or pericholecystic fluid. It may be prudent in this circumstance to consider nuclear medicine hepatobiliary imaging study to assess for cystic duct patency. Study otherwise unremarkable. Electronically Signed   By: Bretta Bang III M.D.   On: 09/17/2017 13:04    Assessment and Plan:   1. Pre-operative assessment: possible cholecystectomy - Troponin peak 0.06 --> flat x3 at 0.04. - EKG with ventricular paced rhythm - Patient has a history of HTN, DM requiring insulin, CHF, and a Cr >2.0. He is limited in mobility by back pain but states he is able to climb 1 flight of stairs without SOB/CP. He denies any anginal symptoms.  - Using the Revised Cardiac Risk Index patient has an 11% chance of myocardial infarction, pulmonary edema, ventricular fibrillation/primary cardiac arrest, or complete heart block associated with this procedure. - - He is at an elevated but acceptable risk for a low risk surgery.  - No further cardiac work up at this time.   2. CAD s/p CABG: appears stable. Patient without anginal complaints - Trop flat at 0.06 > 0.04 x3 - EKG w ventricular pacing  3. Chronic systolic CHF: EF 62-37% (06/2015) - appears euvolemic. Patient without recent volume overload symptoms.  - CXR without vascular congestion - Home lasix on hold - Monitor fluid status closely in the peri-operative setting with IVF administration - Echo pending - Continue coreg and uptitrate back to home dose as BP tolerates  4. Cholangitis: unable to perform MRCP due to ICD. GI deferring ERCP at this time.  - BCx+ klebsiella and enterococcus - Surgery following to determine if CCY indicated  - Antibiotics per primary team  5. S/p ICD with : last device check 07/2017 was normal and no changes made.   6. Atrial fibrillation:  - Eliquis on hold for possible CCY - Would resume AC post-procedure when cleared by surgery -  Patients CHA2DS2-VASc Score and unadjusted Ischemic Stroke Rate (% per year) is equal to 9.7 % stroke rate/year from a score of 6 Above score calculated as 1 point each if present [CHF, HTN, DM, Vascular=MI/PAD/Aortic Plaque, Age if 65-74, or Male] Above score calculated as  2 points each if present [Age > 75, or Stroke/TIA/TE]  7. Acute on chronic renal insuficiency: baseline Cr 1.99; 3.35 today - Home lasix and losartan on hold - Management per primary team. Caution with IVF - monitor volume status closely  8. Hyponatremia: 129 1/21 - Management per primary team  9. Hyperlipidemia: - Crestor on hold given transaminitis. Resume once LFTs normalize.  10. DM: hgb A1C 8.0 - ISS per primary team  For questions or updates, please contact [facility] Please consult [email or web address] for contact info under Cardiology/STEMI.   Signed, Beatriz Stallion, PA-C  09/19/2017 1:12 PM [phone]

## 2017-09-19 NOTE — Progress Notes (Signed)
CRITICAL VALUE ALERT  Critical Value:  Positive Blood Culture: Positive for Gram positive rods in anaerobic bottle.  Date & Time Notied:  09/19/17 0020  Provider Notified: Craige Cotta   Orders Received/Actions taken: No orders at this time.

## 2017-09-20 ENCOUNTER — Other Ambulatory Visit (HOSPITAL_COMMUNITY): Payer: Medicare HMO

## 2017-09-20 ENCOUNTER — Inpatient Hospital Stay (HOSPITAL_COMMUNITY): Payer: Medicare HMO | Admitting: Anesthesiology

## 2017-09-20 ENCOUNTER — Encounter (HOSPITAL_COMMUNITY)
Admission: EM | Disposition: A | Discharge status: Home / Self Care | Payer: Self-pay | Source: Home / Self Care | Attending: Internal Medicine

## 2017-09-20 ENCOUNTER — Encounter (HOSPITAL_COMMUNITY): Payer: Self-pay

## 2017-09-20 DIAGNOSIS — Z4502 Encounter for adjustment and management of automatic implantable cardiac defibrillator: Secondary | ICD-10-CM

## 2017-09-20 DIAGNOSIS — A419 Sepsis, unspecified organism: Secondary | ICD-10-CM

## 2017-09-20 HISTORY — PX: CHOLECYSTECTOMY: SHX55

## 2017-09-20 LAB — CBC WITH DIFFERENTIAL/PLATELET
Basophils Absolute: 0 10*3/uL (ref 0.0–0.1)
Basophils Relative: 0 %
Eosinophils Absolute: 0.1 10*3/uL (ref 0.0–0.7)
Eosinophils Relative: 1 %
HCT: 42.1 % (ref 39.0–52.0)
Hemoglobin: 14.4 g/dL (ref 13.0–17.0)
LYMPHS ABS: 1.6 10*3/uL (ref 0.7–4.0)
LYMPHS PCT: 17 %
MCH: 31.8 pg (ref 26.0–34.0)
MCHC: 34.2 g/dL (ref 30.0–36.0)
MCV: 92.9 fL (ref 78.0–100.0)
MONO ABS: 0.7 10*3/uL (ref 0.1–1.0)
MONOS PCT: 7 %
Neutro Abs: 7.4 10*3/uL (ref 1.7–7.7)
Neutrophils Relative %: 75 %
PLATELETS: 130 10*3/uL — AB (ref 150–400)
RBC: 4.53 MIL/uL (ref 4.22–5.81)
RDW: 14.3 % (ref 11.5–15.5)
WBC: 9.8 10*3/uL (ref 4.0–10.5)

## 2017-09-20 LAB — COMPREHENSIVE METABOLIC PANEL
ALK PHOS: 154 U/L — AB (ref 38–126)
ALT: 177 U/L — ABNORMAL HIGH (ref 17–63)
ANION GAP: 9 (ref 5–15)
AST: 48 U/L — ABNORMAL HIGH (ref 15–41)
Albumin: 2.8 g/dL — ABNORMAL LOW (ref 3.5–5.0)
BILIRUBIN TOTAL: 2.2 mg/dL — AB (ref 0.3–1.2)
BUN: 35 mg/dL — AB (ref 6–20)
CHLORIDE: 109 mmol/L (ref 101–111)
CO2: 21 mmol/L — AB (ref 22–32)
CREATININE: 1.99 mg/dL — AB (ref 0.61–1.24)
Calcium: 8.2 mg/dL — ABNORMAL LOW (ref 8.9–10.3)
GFR calc non Af Amer: 30 mL/min — ABNORMAL LOW (ref >60)
GFR, EST AFRICAN AMERICAN: 35 mL/min — AB (ref >60)
GLUCOSE: 92 mg/dL (ref 65–99)
Potassium: 4.4 mmol/L (ref 3.5–5.1)
SODIUM: 139 mmol/L (ref 135–145)
Total Protein: 6 g/dL — ABNORMAL LOW (ref 6.5–8.1)

## 2017-09-20 LAB — LIPASE, BLOOD: Lipase: 68 U/L — ABNORMAL HIGH (ref 11–51)

## 2017-09-20 LAB — GLUCOSE, CAPILLARY
GLUCOSE-CAPILLARY: 115 mg/dL — AB (ref 65–99)
GLUCOSE-CAPILLARY: 146 mg/dL — AB (ref 65–99)
Glucose-Capillary: 97 mg/dL (ref 65–99)
Glucose-Capillary: 101 mg/dL — ABNORMAL HIGH (ref 65–99)
Glucose-Capillary: 176 mg/dL — ABNORMAL HIGH (ref 65–99)

## 2017-09-20 LAB — TYPE AND SCREEN
ABO/RH(D): A POS
Antibody Screen: NEGATIVE

## 2017-09-20 LAB — MAGNESIUM: Magnesium: 2.1 mg/dL (ref 1.7–2.4)

## 2017-09-20 SURGERY — LAPAROSCOPIC CHOLECYSTECTOMY
Anesthesia: General | Site: Abdomen

## 2017-09-20 MED ORDER — EPHEDRINE SULFATE-NACL 50-0.9 MG/10ML-% IV SOSY
PREFILLED_SYRINGE | INTRAVENOUS | Status: DC | PRN
  Administered 2017-09-20: 10 mg via INTRAVENOUS

## 2017-09-20 MED ORDER — PHENYLEPHRINE 40 MCG/ML (10ML) SYRINGE FOR IV PUSH (FOR BLOOD PRESSURE SUPPORT)
PREFILLED_SYRINGE | INTRAVENOUS | Status: DC | PRN
  Administered 2017-09-20 (×3): 80 ug via INTRAVENOUS

## 2017-09-20 MED ORDER — BUPIVACAINE HCL (PF) 0.25 % IJ SOLN
INTRAMUSCULAR | Status: AC
Start: 2017-09-20 — End: 2017-09-20
  Filled 2017-09-20: qty 30

## 2017-09-20 MED ORDER — PROPOFOL 10 MG/ML IV BOLUS
INTRAVENOUS | Status: AC
  Filled 2017-09-20: qty 20

## 2017-09-20 MED ORDER — SODIUM CHLORIDE 0.9 % IR SOLN
Status: DC | PRN
  Administered 2017-09-20: 1000 mL

## 2017-09-20 MED ORDER — BUPIVACAINE HCL 0.25 % IJ SOLN
INTRAMUSCULAR | Status: DC | PRN
  Administered 2017-09-20: 7 mL

## 2017-09-20 MED ORDER — ONDANSETRON HCL 4 MG/2ML IJ SOLN: 4.0000 mg | Freq: Four times a day (QID) | INTRAMUSCULAR | Status: DC | PRN

## 2017-09-20 MED ORDER — SUGAMMADEX SODIUM 200 MG/2ML IV SOLN
INTRAVENOUS | Status: DC | PRN
  Administered 2017-09-20: 200 mg via INTRAVENOUS

## 2017-09-20 MED ORDER — FENTANYL CITRATE (PF) 250 MCG/5ML IJ SOLN
INTRAMUSCULAR | Status: AC
  Filled 2017-09-20: qty 5

## 2017-09-20 MED ORDER — ONDANSETRON HCL 4 MG/2ML IJ SOLN
INTRAMUSCULAR | Status: DC | PRN
  Administered 2017-09-20: 4 mg via INTRAVENOUS

## 2017-09-20 MED ORDER — FENTANYL CITRATE (PF) 100 MCG/2ML IJ SOLN
25.0000 ug | INTRAMUSCULAR | Status: DC | PRN
  Administered 2017-09-20 (×2): 50 ug via INTRAVENOUS

## 2017-09-20 MED ORDER — 0.9 % SODIUM CHLORIDE (POUR BTL) OPTIME
TOPICAL | Status: DC | PRN
  Administered 2017-09-20: 1000 mL

## 2017-09-20 MED ORDER — SODIUM CHLORIDE 0.9 % IV SOLN
INTRAVENOUS | Status: DC
  Administered 2017-09-20 (×2): via INTRAVENOUS

## 2017-09-20 MED ORDER — DEXAMETHASONE SODIUM PHOSPHATE 10 MG/ML IJ SOLN
INTRAMUSCULAR | Status: DC | PRN
  Administered 2017-09-20: 5 mg via INTRAVENOUS

## 2017-09-20 MED ORDER — LACTATED RINGERS IV SOLN
INTRAVENOUS | Status: DC | PRN
  Administered 2017-09-20: 10:00:00 via INTRAVENOUS

## 2017-09-20 MED ORDER — ROCURONIUM BROMIDE 10 MG/ML (PF) SYRINGE
PREFILLED_SYRINGE | INTRAVENOUS | Status: DC | PRN
  Administered 2017-09-20: 50 mg via INTRAVENOUS
  Administered 2017-09-20: 10 mg via INTRAVENOUS

## 2017-09-20 MED ORDER — FENTANYL CITRATE (PF) 100 MCG/2ML IJ SOLN
INTRAMUSCULAR | Status: AC
  Filled 2017-09-20: qty 2

## 2017-09-20 MED ORDER — OXYCODONE HCL 5 MG PO TABS: 5.0000 mg | ORAL_TABLET | Freq: Once | ORAL | Status: DC | PRN

## 2017-09-20 MED ORDER — PROPOFOL 10 MG/ML IV BOLUS
INTRAVENOUS | Status: DC | PRN
  Administered 2017-09-20: 100 mg via INTRAVENOUS

## 2017-09-20 MED ORDER — OXYCODONE HCL 5 MG/5ML PO SOLN: 5.0000 mg | Freq: Once | ORAL | Status: DC | PRN

## 2017-09-20 MED ORDER — FENTANYL CITRATE (PF) 250 MCG/5ML IJ SOLN
INTRAMUSCULAR | Status: DC | PRN
  Administered 2017-09-20: 50 ug via INTRAVENOUS
  Administered 2017-09-20: 100 ug via INTRAVENOUS

## 2017-09-20 MED ORDER — LIDOCAINE 2% (20 MG/ML) 5 ML SYRINGE
INTRAMUSCULAR | Status: DC | PRN
  Administered 2017-09-20: 60 mg via INTRAVENOUS

## 2017-09-20 SURGICAL SUPPLY — 46 items
APL SKNCLS STERI-STRIP NONHPOA (GAUZE/BANDAGES/DRESSINGS) ×2
BAG SPEC RTRVL 10 TROC 200 (ENDOMECHANICALS)
BENZOIN TINCTURE PRP APPL 2/3 (GAUZE/BANDAGES/DRESSINGS) ×4 IMPLANT
BIOPATCH RED 1 DISK 7.0 (GAUZE/BANDAGES/DRESSINGS) ×2 IMPLANT
BIOPATCH RED 1IN DISK 7.0MM (GAUZE/BANDAGES/DRESSINGS) ×1
CANISTER SUCT 3000ML PPV (MISCELLANEOUS) ×4 IMPLANT
CHLORAPREP W/TINT 26ML (MISCELLANEOUS) ×4 IMPLANT
CLIP VESOLOCK MED LG 6/CT (CLIP) ×4 IMPLANT
CLOSURE WOUND 1/2 X4 (GAUZE/BANDAGES/DRESSINGS) ×1
COVER SURGICAL LIGHT HANDLE (MISCELLANEOUS) ×4 IMPLANT
DRAIN CHANNEL 19F RND (DRAIN) ×3 IMPLANT
DRSG TEGADERM 2-3/8X2-3/4 SM (GAUZE/BANDAGES/DRESSINGS) ×3 IMPLANT
ELECT REM PT RETURN 9FT ADLT (ELECTROSURGICAL) ×4
ELECTRODE REM PT RTRN 9FT ADLT (ELECTROSURGICAL) ×2 IMPLANT
EVACUATOR SILICONE 100CC (DRAIN) ×3 IMPLANT
GAUZE SPONGE 2X2 8PLY STRL LF (GAUZE/BANDAGES/DRESSINGS) ×2 IMPLANT
GLOVE BIO SURGEON STRL SZ7.5 (GLOVE) ×4 IMPLANT
GOWN STRL REUS W/ TWL LRG LVL3 (GOWN DISPOSABLE) ×4 IMPLANT
GOWN STRL REUS W/ TWL XL LVL3 (GOWN DISPOSABLE) ×2 IMPLANT
GOWN STRL REUS W/TWL LRG LVL3 (GOWN DISPOSABLE) ×8
GOWN STRL REUS W/TWL XL LVL3 (GOWN DISPOSABLE) ×4
GRASPER SUT TROCAR 14GX15 (MISCELLANEOUS) ×4 IMPLANT
KIT BASIN OR (CUSTOM PROCEDURE TRAY) ×4 IMPLANT
KIT ROOM TURNOVER OR (KITS) ×4 IMPLANT
NDL INSUFFLATION 14GA 120MM (NEEDLE) ×1 IMPLANT
NEEDLE INSUFFLATION 14GA 120MM (NEEDLE) ×4 IMPLANT
NS IRRIG 1000ML POUR BTL (IV SOLUTION) ×4 IMPLANT
PAD ARMBOARD 7.5X6 YLW CONV (MISCELLANEOUS) ×8 IMPLANT
POUCH RETRIEVAL ECOSAC 10 (ENDOMECHANICALS) IMPLANT
POUCH RETRIEVAL ECOSAC 10MM (ENDOMECHANICALS)
SCISSORS LAP 5X35 DISP (ENDOMECHANICALS) ×4 IMPLANT
SET IRRIG TUBING LAPAROSCOPIC (IRRIGATION / IRRIGATOR) ×4 IMPLANT
SLEEVE ENDOPATH XCEL 5M (ENDOMECHANICALS) ×7 IMPLANT
SPECIMEN JAR SMALL (MISCELLANEOUS) ×4 IMPLANT
SPONGE GAUZE 2X2 STER 10/PKG (GAUZE/BANDAGES/DRESSINGS) ×2
STRIP CLOSURE SKIN 1/2X4 (GAUZE/BANDAGES/DRESSINGS) ×3 IMPLANT
SUT ETHILON 2 0 FS 18 (SUTURE) ×3 IMPLANT
SUT MNCRL AB 3-0 PS2 18 (SUTURE) ×4 IMPLANT
TAPE CLOTH SURG 4X10 WHT LF (GAUZE/BANDAGES/DRESSINGS) ×3 IMPLANT
TOWEL OR 17X24 6PK STRL BLUE (TOWEL DISPOSABLE) ×4 IMPLANT
TOWEL OR 17X26 10 PK STRL BLUE (TOWEL DISPOSABLE) ×4 IMPLANT
TRAY LAPAROSCOPIC MC (CUSTOM PROCEDURE TRAY) ×4 IMPLANT
TROCAR XCEL NON-BLD 11X100MML (ENDOMECHANICALS) ×4 IMPLANT
TROCAR XCEL NON-BLD 5MMX100MML (ENDOMECHANICALS) ×4 IMPLANT
TUBING INSUFFLATION (TUBING) ×4 IMPLANT
WATER STERILE IRR 1000ML POUR (IV SOLUTION) ×4 IMPLANT

## 2017-09-20 NOTE — Progress Notes (Signed)
CC: Abdominal pain, nausea  Subjective: Patient is pain-free this a.m.  Tolerating clears last evening with no problems.  Objective: Vital signs in last 24 hours: Temp:  [98.2 F (36.8 C)-98.9 F (37.2 C)] 98.7 F (37.1 C) (01/23 0524) Pulse Rate:  [55] 55 (01/23 0524) Resp:  [18] 18 (01/23 0524) BP: (122-133)/(49-71) 128/71 (01/23 0524) SpO2:  [95 %-100 %] 95 % (01/23 0524) Last BM Date: 09/18/17 780 PO 1995 IV Urine 925 Afebrile, VSS Labs pending  Intake/Output from previous day: 01/22 0701 - 01/23 0700 In: 2825 [P.O.:780; I.V.:1995; IV Piggyback:50] Out: 925 [Urine:925] Intake/Output this shift: No intake/output data recorded.  General appearance: alert, cooperative and no distress Resp: clear to auscultation bilaterally GI: soft, non-tender; bowel sounds normal; no masses,  no organomegaly  Lab Results:  Recent Labs    09/17/17 1002 09/18/17 0502  WBC 28.8* 18.7*  HGB 15.1 13.5  HCT 44.9 39.6  PLT 123* 107*    BMET Recent Labs    09/17/17 1002 09/18/17 0502  NA 132* 129*  K 4.6 4.1  CL 95* 98*  CO2 23 24  GLUCOSE 156* 120*  BUN 41* 50*  CREATININE 3.44* 3.35*  CALCIUM 8.6* 7.5*   PT/INR Recent Labs    09/18/17 0502  LABPROT 21.3*  INR 1.86    Recent Labs  Lab 09/17/17 1002 09/18/17 0502  AST 382* 161*  ALT 569* 341*  ALKPHOS 184* 135*  BILITOT 6.8* 3.6*  PROT 6.6 5.6*  ALBUMIN 3.3* 2.8*     Lipase     Component Value Date/Time   LIPASE 23 09/17/2017 1006     Medications: . carvedilol  6.25 mg Oral BID WC  . feeding supplement (GLUCERNA SHAKE)  237 mL Oral TID BM  . insulin aspart  0-9 Units Subcutaneous TID WC  . insulin glargine  12 Units Subcutaneous Daily  . pantoprazole  40 mg Oral Daily   . cefTRIAXone (ROCEPHIN)  IV Stopped (09/19/17 0959)   Anti-infectives (From admission, onward)   Start     Dose/Rate Route Frequency Ordered Stop   09/19/17 1200  vancomycin (VANCOCIN) 1,500 mg in sodium chloride 0.9 %  500 mL IVPB  Status:  Discontinued     1,500 mg 250 mL/hr over 120 Minutes Intravenous  Once 09/19/17 0247 09/19/17 0633   09/19/17 0800  cefTRIAXone (ROCEPHIN) 2 g in dextrose 5 % 50 mL IVPB     2 g 100 mL/hr over 30 Minutes Intravenous Every 24 hours 09/19/17 0642     09/18/17 1100  ceFEPIme (MAXIPIME) 1 g in dextrose 5 % 50 mL IVPB  Status:  Discontinued     1 g 100 mL/hr over 30 Minutes Intravenous Every 24 hours 09/17/17 1713 09/19/17 0633   09/18/17 1100  metroNIDAZOLE (FLAGYL) IVPB 500 mg  Status:  Discontinued     500 mg 100 mL/hr over 60 Minutes Intravenous Every 8 hours 09/18/17 1058 09/19/17 1130   09/17/17 1130  vancomycin (VANCOCIN) IVPB 1000 mg/200 mL premix     1,000 mg 200 mL/hr over 60 Minutes Intravenous Every 1 hr x 2 09/17/17 1116 09/17/17 1435   09/17/17 1115  vancomycin (VANCOCIN) 2,000 mg in sodium chloride 0.9 % 500 mL IVPB  Status:  Discontinued     2,000 mg 250 mL/hr over 120 Minutes Intravenous  Once 09/17/17 1110 09/17/17 1116   09/17/17 1115  ceFEPIme (MAXIPIME) 2 g in dextrose 5 % 50 mL IVPB     2 g 100  mL/hr over 30 Minutes Intravenous  Once 09/17/17 1110 09/17/17 1232      Assessment/Plan Cholangitis/Cholelithiasis  CAD/CABG/MVR/Cardiomyopathy last EF 25%/AICD Atrial fibrillation  Chronic anticoagulation - Eliquis  09/17/17 Chronic kidney disease  PVOD Chronic back pain - limits mobility some Hypertension  FEN: NPO/No IV will start one now ID:  Rocephin/Vancomycin 1/20 - day 3 DVT:  Eliquis on hold/SCD Foley:  None Follow up:  [facility]  Plan: Labs are pending he has had surgical clearance from cardiology.  If his labs are acceptable we will plan to do them later today.  0 500 a.m. labs are still pending.      LOS: 3 days    Kiva Norland 09/20/2017 [phone]

## 2017-09-20 NOTE — Progress Notes (Signed)
Patient arrived to 6N15 A&Ox4, VSS, IV intact and infusing.  Noted to have 3 port sites with gauze and tape dressing present.  JP drain present in RUQ of abdomin with bloody drainage.  Patient denies pain and describes "soreness".  Family at bedside.  Will continue to monitor.

## 2017-09-20 NOTE — Progress Notes (Signed)
PROGRESS NOTE    Wayne Gordon   ZOX:[id number]  DOB: December 01, 1933  DOA: 09/17/2017 PCP: Samuel Jester, DO   Brief Narrative:  Wayne Gordon is an 84 y/o with CAD sp CABG, mitral valve repair, chronic systolic CHF with EF of 35-40%, AICD, HTN, DM2, A-fib on Apixaban, cholelithiasis who presented for vomiting, RUQ pain, weakness.  GI felt his symptoms may be due to a recently passed gallstone. He was transferred from [facility] to [facility] for a cholecystectomy.    Subjective: Having pain at the site of the drain. ROS: no complaints of nausea, vomiting, constipation diarrhea, cough, dyspnea or dysuria. No other complaints.   Assessment & Plan:   Vomiting/ abdominal pain/ cholelithiasis - underwent lap chole today with drain placement  Sepsis- fever leukocytosis hypotension - Cholangitis  - cont Ceftriaxone  DM2 - cont Lantus and SSI  A-fib - eliquis on hold for surgery- can resume tomorrow if OK with surgery  Chronic systolic CHF - follow fluid status- EF 35-40% on ECHO on 2016 - Lasix has been on hold due to AKI  AKI on CKD 3 - baseline Cr about 1.9-2.2 - Cr 3.44 on admission- has improved steadily- follow- cont slow infusion - Lasix and ARB on hold   DVT prophylaxis: SCDs Code Status: Full code Family Communication:  Disposition Plan: follow on med surg Consultants:   Cardiology  gen surgery  GI Procedures:   Lap chole  Antimicrobials:  Anti-infectives (From admission, onward)   Start     Dose/Rate Route Frequency Ordered Stop   09/19/17 1200  vancomycin (VANCOCIN) 1,500 mg in sodium chloride 0.9 % 500 mL IVPB  Status:  Discontinued     1,500 mg 250 mL/hr over 120 Minutes Intravenous  Once 09/19/17 0247 09/19/17 0633   09/19/17 0800  cefTRIAXone (ROCEPHIN) 2 g in dextrose 5 % 50 mL IVPB     2 g 100 mL/hr over 30 Minutes Intravenous Every 24 hours 09/19/17 0642     09/18/17 1100  ceFEPIme (MAXIPIME) 1 g in dextrose 5 % 50 mL IVPB  Status:   Discontinued     1 g 100 mL/hr over 30 Minutes Intravenous Every 24 hours 09/17/17 1713 09/19/17 0633   09/18/17 1100  metroNIDAZOLE (FLAGYL) IVPB 500 mg  Status:  Discontinued     500 mg 100 mL/hr over 60 Minutes Intravenous Every 8 hours 09/18/17 1058 09/19/17 1130   09/17/17 1130  vancomycin (VANCOCIN) IVPB 1000 mg/200 mL premix     1,000 mg 200 mL/hr over 60 Minutes Intravenous Every 1 hr x 2 09/17/17 1116 09/17/17 1435   09/17/17 1115  vancomycin (VANCOCIN) 2,000 mg in sodium chloride 0.9 % 500 mL IVPB  Status:  Discontinued     2,000 mg 250 mL/hr over 120 Minutes Intravenous  Once 09/17/17 1110 09/17/17 1116   09/17/17 1115  ceFEPIme (MAXIPIME) 2 g in dextrose 5 % 50 mL IVPB     2 g 100 mL/hr over 30 Minutes Intravenous  Once 09/17/17 1110 09/17/17 1232       Objective: Vitals:   09/20/17 1230 09/20/17 1232 09/20/17 1245 09/20/17 1258  BP:  (!) 154/80  (!) 154/82  Pulse: (!) 55 (!) 53 (!) 55 (!) 55  Resp: 17 15 14    Temp:   (!) 97.4 F (36.3 C) 97.9 F (36.6 C)  TempSrc:    Oral  SpO2: 95% 98% 94% 93%  Weight:      Height:  Intake/Output Summary (Last 24 hours) at 09/20/2017 1544 Last data filed at 09/20/2017 1222 Gross per 24 hour  Intake 1355 ml  Output 680 ml  Net 675 ml   Filed Weights   09/17/17 0952 09/17/17 1649 09/19/17 0445  Weight: 107.5 kg (237 lb) 114.6 kg (252 lb 10.4 oz) 108 kg (238 lb)    Examination: General exam: Appears comfortable  HEENT: PERRLA, oral mucosa moist, no sclera icterus or thrush Respiratory system: Clear to auscultation. Respiratory effort normal. Cardiovascular system: S1 & S2 heard, RRR.  No murmurs  Gastrointestinal system: Abdomen soft,  Tender, drain present, nondistended. Normal bowel sound. No organomegaly Central nervous system: Alert and oriented. No focal neurological deficits. Extremities: No cyanosis, clubbing or edema Skin: No rashes or ulcers Psychiatry:  Mood & affect appropriate.     Data Reviewed:  I have personally reviewed following labs and imaging studies  CBC: Recent Labs  Lab 09/17/17 1002 09/18/17 0502 09/20/17 0745  WBC 28.8* 18.7* 9.8  NEUTROABS 25.7* 16.1* 7.4  HGB 15.1 13.5 14.4  HCT 44.9 39.6 42.1  MCV 94.3 92.3 92.9  PLT 123* 107* 130*   Basic Metabolic Panel: Recent Labs  Lab 09/17/17 1002 09/18/17 0502 09/20/17 0745  NA 132* 129* 139  K 4.6 4.1 4.4  CL 95* 98* 109  CO2 23 24 21*  GLUCOSE 156* 120* 92  BUN 41* 50* 35*  CREATININE 3.44* 3.35* 1.99*  CALCIUM 8.6* 7.5* 8.2*  MG  --  1.6* 2.1   GFR: Estimated Creatinine Clearance: 39.8 mL/min (A) (by C-G formula based on SCr of 1.99 mg/dL (H)). Liver Function Tests: Recent Labs  Lab 09/17/17 1002 09/18/17 0502 09/20/17 0745  AST 382* 161* 48*  ALT 569* 341* 177*  ALKPHOS 184* 135* 154*  BILITOT 6.8* 3.6* 2.2*  PROT 6.6 5.6* 6.0*  ALBUMIN 3.3* 2.8* 2.8*   Recent Labs  Lab 09/17/17 1006 09/20/17 0745  LIPASE 23 68*   No results for input(s): AMMONIA in the last 168 hours. Coagulation Profile: Recent Labs  Lab 09/18/17 0502  INR 1.86   Cardiac Enzymes: Recent Labs  Lab 09/17/17 1002 09/17/17 1853 09/18/17 0032 09/18/17 0502  TROPONINI 0.06* 0.04* 0.04* 0.04*   BNP (last 3 results) No results for input(s): PROBNP in the last 8760 hours. HbA1C: No results for input(s): HGBA1C in the last 72 hours. CBG: Recent Labs  Lab 09/19/17 1706 09/19/17 2127 09/20/17 0811 09/20/17 1142 09/20/17 1258  GLUCAP 132* 131* 97 101* 115*   Lipid Profile: No results for input(s): CHOL, HDL, LDLCALC, TRIG, CHOLHDL, LDLDIRECT in the last 72 hours. Thyroid Function Tests: No results for input(s): TSH, T4TOTAL, FREET4, T3FREE, THYROIDAB in the last 72 hours. Anemia Panel: No results for input(s): VITAMINB12, FOLATE, FERRITIN, TIBC, IRON, RETICCTPCT in the last 72 hours. Urine analysis:    Component Value Date/Time   COLORURINE YELLOW 09/19/2017 0437   APPEARANCEUR CLEAR 09/19/2017 0437    LABSPEC 1.004 (L) 09/19/2017 0437   PHURINE 6.0 09/19/2017 0437   GLUCOSEU NEGATIVE 09/19/2017 0437   HGBUR SMALL (A) 09/19/2017 0437   BILIRUBINUR NEGATIVE 09/19/2017 0437   KETONESUR NEGATIVE 09/19/2017 0437   PROTEINUR NEGATIVE 09/19/2017 0437   UROBILINOGEN 0.2 02/04/2014 0939   NITRITE NEGATIVE 09/19/2017 0437   LEUKOCYTESUR NEGATIVE 09/19/2017 0437   Sepsis Labs: @LABRCNTIP (procalcitonin:4,lacticidven:4) ) Recent Results (from the past 240 hour(s))  Blood culture (routine x 2)     Status: None (Preliminary result)   Collection Time: 09/17/17 11:12 AM  Result Value  Ref Range Status   Specimen Description RIGHT ANTECUBITAL  Final   Special Requests   Final    BOTTLES DRAWN AEROBIC AND ANAEROBIC Blood Culture adequate volume   Culture NO GROWTH 3 DAYS  Final   Report Status PENDING  Incomplete  Blood culture (routine x 2)     Status: Abnormal (Preliminary result)   Collection Time: 09/17/17 11:18 AM  Result Value Ref Range Status   Specimen Description BLOOD RIGHT HAND  Final   Special Requests   Final    BOTTLES DRAWN AEROBIC AND ANAEROBIC Blood Culture results may not be optimal due to an inadequate volume of blood received in culture bottles   Culture  Setup Time   Final    Gram Stain Report Called to,Read Back By and Verified With: HARRIS,B @ 0020 ON 1.22.19 BY BOWMAN,L CORRECTED RESULTS GRAM NEGATIVE RODS ANAEROBIC BOTTLE ONLY PREVIOUSLY REPORTED AS: GRAM POSITIVE RODS ANAEROBIC BOTTLE ONLY CORRECTED RESULTS CALLED TO: PHARMD G ABBOTT [id number] BY A BROWNING    Culture (A)  Final    KLEBSIELLA PNEUMONIAE SUSCEPTIBILITIES TO FOLLOW Performed at [facility], [address], [place], [place] [address]    Report Status PENDING  Incomplete  Blood Culture ID Panel (Reflexed)     Status: Abnormal   Collection Time: 09/17/17 11:18 AM  Result Value Ref Range Status   Enterococcus species NOT DETECTED NOT DETECTED Final   Listeria monocytogenes NOT DETECTED NOT  DETECTED Final   Staphylococcus species NOT DETECTED NOT DETECTED Final   Staphylococcus aureus NOT DETECTED NOT DETECTED Final   Streptococcus species NOT DETECTED NOT DETECTED Final   Streptococcus agalactiae NOT DETECTED NOT DETECTED Final   Streptococcus pneumoniae NOT DETECTED NOT DETECTED Final   Streptococcus pyogenes NOT DETECTED NOT DETECTED Final   Acinetobacter baumannii NOT DETECTED NOT DETECTED Final   Enterobacteriaceae species DETECTED (A) NOT DETECTED Final    Comment: Enterobacteriaceae represent a large family of gram-negative bacteria, not a single organism. CRITICAL RESULT CALLED TO, READ BACK BY AND VERIFIED WITH: G ABBOTT PHARMD [id number] 09/19/17 A BROWNING    Enterobacter cloacae complex NOT DETECTED NOT DETECTED Final   Escherichia coli NOT DETECTED NOT DETECTED Final   Klebsiella oxytoca NOT DETECTED NOT DETECTED Final   Klebsiella pneumoniae DETECTED (A) NOT DETECTED Final    Comment: CRITICAL RESULT CALLED TO, READ BACK BY AND VERIFIED WITH: Evelena Peat PHARMD 2130 09/19/17 A BROWNING    Proteus species NOT DETECTED NOT DETECTED Final   Serratia marcescens NOT DETECTED NOT DETECTED Final   Carbapenem resistance NOT DETECTED NOT DETECTED Final   Haemophilus influenzae NOT DETECTED NOT DETECTED Final   Neisseria meningitidis NOT DETECTED NOT DETECTED Final   Pseudomonas aeruginosa NOT DETECTED NOT DETECTED Final   Candida albicans NOT DETECTED NOT DETECTED Final   Candida glabrata NOT DETECTED NOT DETECTED Final   Candida krusei NOT DETECTED NOT DETECTED Final   Candida parapsilosis NOT DETECTED NOT DETECTED Final   Candida tropicalis NOT DETECTED NOT DETECTED Final    Comment: Performed at [facility], [address], [place], [place] [address]         Radiology Studies: No results found.    Scheduled Meds: . carvedilol  6.25 mg Oral BID WC  . feeding supplement (GLUCERNA SHAKE)  237 mL Oral TID BM  . fentaNYL      . insulin aspart  0-9 Units  Subcutaneous TID WC  . insulin glargine  12 Units Subcutaneous Daily  .  pantoprazole  40 mg Oral Daily   Continuous Infusions: . sodium chloride 75 mL/hr at 09/20/17 0818  . cefTRIAXone (ROCEPHIN)  IV 0 g (09/19/17 0959)     LOS: 3 days    Time spent in minutes: 35    Calvert Cantor, MD [facility] Pager: [email or web address] Password [facility] 09/20/2017, 3:44 PM

## 2017-09-20 NOTE — Anesthesia Preprocedure Evaluation (Signed)
Anesthesia Evaluation  Patient identified by MRN, date of birth, ID band Patient awake    Reviewed: Allergy & Precautions, H&P , NPO status , Patient's Chart, lab work & pertinent test results  Airway Mallampati: II   Neck ROM: full    Dental   Pulmonary neg pulmonary ROS,    breath sounds clear to auscultation       Cardiovascular hypertension, + CAD, + Past MI, + CABG, + Peripheral Vascular Disease and +CHF  + dysrhythmias Atrial Fibrillation + pacemaker + Cardiac Defibrillator  Rhythm:regular Rate:Normal  S/p MVR.  Pt has complete heart block and is pacer dependent.   Neuro/Psych    GI/Hepatic GERD  ,  Endo/Other  diabetes, Type 2  Renal/GU Renal InsufficiencyRenal disease     Musculoskeletal  (+) Arthritis ,   Abdominal   Peds  Hematology   Anesthesia Other Findings   Reproductive/Obstetrics                             Anesthesia Physical Anesthesia Plan  ASA: IV  Anesthesia Plan: General   Post-op Pain Management:    Induction: Intravenous  PONV Risk Score and Plan: 2 and Ondansetron, Dexamethasone and Treatment may vary due to age or medical condition  Airway Management Planned: Oral ETT  Additional Equipment:   Intra-op Plan:   Post-operative Plan: Extubation in OR  Informed Consent: I have reviewed the patients History and Physical, chart, labs and discussed the procedure including the risks, benefits and alternatives for the proposed anesthesia with the patient or authorized representative who has indicated his/her understanding and acceptance.     Plan Discussed with: CRNA, Anesthesiologist and Surgeon  Anesthesia Plan Comments:         Anesthesia Quick Evaluation

## 2017-09-20 NOTE — Anesthesia Procedure Notes (Signed)
Procedure Name: Intubation Date/Time: 09/20/2017 10:20 AM Performed by: Julieta Bellini, CRNA Pre-anesthesia Checklist: Patient identified, Emergency Drugs available, Suction available and Patient being monitored Patient Re-evaluated:Patient Re-evaluated prior to induction Oxygen Delivery Method: Circle system utilized Preoxygenation: Pre-oxygenation with 100% oxygen Induction Type: IV induction Ventilation: Mask ventilation without difficulty and Oral airway inserted - appropriate to patient size Laryngoscope Size: Mac and 4 Grade View: Grade I Tube type: Oral Tube size: 7.5 mm Number of attempts: 1 Airway Equipment and Method: Stylet Placement Confirmation: ETT inserted through vocal cords under direct vision,  positive ETCO2 and breath sounds checked- equal and bilateral Secured at: 24 cm Tube secured with: Tape Dental Injury: Teeth and Oropharynx as per pre-operative assessment

## 2017-09-20 NOTE — CV Procedure (Signed)
Attempted 2D Echo, patient in OR, will try at a later time.  Wayne Gordon

## 2017-09-20 NOTE — Transfer of Care (Signed)
Immediate Anesthesia Transfer of Care Note  Patient: Wayne Gordon  Procedure(s) Performed: LAPAROSCOPIC SUBTOTAL CHOLECYSTECTOMY (N/A Abdomen)  Patient Location: PACU  Anesthesia Type:General  Level of Consciousness: awake, alert , oriented and patient cooperative  Airway & Oxygen Therapy: Patient Spontanous Breathing  Post-op Assessment: Report given to RN, Post -op Vital signs reviewed and stable and Patient moving all extremities X 4  Post vital signs: Reviewed and stable  Last Vitals:  Vitals:   09/20/17 0524 09/20/17 0817  BP: 128/71 (!) 151/89  Pulse: (!) 55   Resp: 18   Temp: 37.1 C 36.9 C  SpO2: 95% 96%    Last Pain:  Vitals:   09/20/17 0817  TempSrc: Oral  PainSc:          Complications: No apparent anesthesia complications

## 2017-09-20 NOTE — Op Note (Signed)
09/20/2017  11:19 AM  PATIENT:  Wayne Gordon  84 y.o. male  PRE-OPERATIVE DIAGNOSIS:  gallstones  POST-OPERATIVE DIAGNOSIS:  gallstones  PROCEDURE:  Procedure(s): LAPAROSCOPIC SUBTOTAL CHOLECYSTECTOMY & drain placement (N/A)  SURGEON:  Surgeon(s) and Role:    Axel Filler, MD - Primary  ANESTHESIA:   local and general  EBL:  30 mL   BLOOD ADMINISTERED:none  DRAINS: 18Fr. JP drain in RUQ  LOCAL MEDICATIONS USED:  BUPIVICAINE   SPECIMEN:  Source of Specimen:  Dome of gallbladder  DISPOSITION OF SPECIMEN:  PATHOLOGY  COUNTS:  YES  TOURNIQUET:  * No tourniquets in log *  DICTATION: .Dragon Dictation The patient was taken to the operating and placed in the supine position with bilateral SCDs in place. The patient was prepped and draped in the usual sterile fashion. A time out was called and all facts were verified. A pneumoperitoneum was obtained via A Veress needle technique to a pressure of 69mm of mercury.  A 15mm trochar was then placed in the right upper quadrant under visualization, and there were no injuries to any abdominal organs. A 11 mm port was then placed in the umbilical region after infiltrating with local anesthesia under direct visualization. A second and third epigastric port and right lower quadrant port placement under direct visualization, respectively.   There was a large amount of inflammation in the right upper quadrant.  It appeared that the colon was intimately adhered to the gallbladder.  Blunt and sharp dissection was undertaken and this was freed up.  The eventually.  There was omentum that was adhered to the gallbladder dome.  This was grasped.  There is a large amount of adhesions, densely adhered to the proximal portion of the gallbladder and close triangle.  It also appears that a portion of the duodenum was intimately adhered to the close triangle.  Blunt dissection was undertaken.  In this could not free up the gallbladder.  Secondary to  the intense inflammation I proceeded with a subtotal cholecystectomy and just removing the dome of the gallbladder, in an effort to minimize any secondary injury to any surrounding structures.  Once this was done a [address] [place] [address] drain was then placed via a right upper quadrant port site and placed over the area of the gallbladder and right hepatic fossa.  The omentum was brought up over the area of the right upper quadrant.  Insufflation was evacuated.  All trochars were removed.  The 11 mm port site was then reapproximated using a 0 Vicryl in a Endo Close device. The skin was then closed with 4-0 Monocryl and the skin dressed with Steri-Strips, gauze, and tape. The patient was awaken from general anesthesia and taken to the recovery room in stable condition.    PLAN OF CARE: Admit to inpatient   PATIENT DISPOSITION:  PACU - hemodynamically stable.   Delay start of Pharmacological VTE agent (>24hrs) due to surgical blood loss or risk of bleeding: no

## 2017-09-21 ENCOUNTER — Inpatient Hospital Stay (HOSPITAL_COMMUNITY): Payer: Medicare HMO

## 2017-09-21 ENCOUNTER — Encounter (HOSPITAL_COMMUNITY): Payer: Self-pay | Admitting: General Surgery

## 2017-09-21 DIAGNOSIS — I361 Nonrheumatic tricuspid (valve) insufficiency: Secondary | ICD-10-CM

## 2017-09-21 DIAGNOSIS — I10 Essential (primary) hypertension: Secondary | ICD-10-CM

## 2017-09-21 DIAGNOSIS — R7881 Bacteremia: Secondary | ICD-10-CM

## 2017-09-21 LAB — COMPREHENSIVE METABOLIC PANEL
ALBUMIN: 2.7 g/dL — AB (ref 3.5–5.0)
ALT: 138 U/L — ABNORMAL HIGH (ref 17–63)
ANION GAP: 12 (ref 5–15)
AST: 42 U/L — AB (ref 15–41)
Alkaline Phosphatase: 146 U/L — ABNORMAL HIGH (ref 38–126)
BILIRUBIN TOTAL: 2.1 mg/dL — AB (ref 0.3–1.2)
BUN: 42 mg/dL — AB (ref 6–20)
CHLORIDE: 108 mmol/L (ref 101–111)
CO2: 17 mmol/L — ABNORMAL LOW (ref 22–32)
Calcium: 8 mg/dL — ABNORMAL LOW (ref 8.9–10.3)
Creatinine, Ser: 2 mg/dL — ABNORMAL HIGH (ref 0.61–1.24)
GFR calc Af Amer: 34 mL/min — ABNORMAL LOW (ref >60)
GFR, EST NON AFRICAN AMERICAN: 30 mL/min — AB (ref >60)
GLUCOSE: 188 mg/dL — AB (ref 65–99)
POTASSIUM: 5.6 mmol/L — AB (ref 3.5–5.1)
Sodium: 137 mmol/L (ref 135–145)
TOTAL PROTEIN: 5.8 g/dL — AB (ref 6.5–8.1)

## 2017-09-21 LAB — CULTURE, BLOOD (ROUTINE X 2)

## 2017-09-21 LAB — CBC WITH DIFFERENTIAL/PLATELET
BASOS ABS: 0 10*3/uL (ref 0.0–0.1)
Basophils Relative: 0 %
EOS PCT: 0 %
Eosinophils Absolute: 0 10*3/uL (ref 0.0–0.7)
HEMATOCRIT: 44.1 % (ref 39.0–52.0)
Hemoglobin: 15.1 g/dL (ref 13.0–17.0)
LYMPHS ABS: 1.5 10*3/uL (ref 0.7–4.0)
LYMPHS PCT: 12 %
MCH: 32.2 pg (ref 26.0–34.0)
MCHC: 34.2 g/dL (ref 30.0–36.0)
MCV: 94 fL (ref 78.0–100.0)
Monocytes Absolute: 0.7 10*3/uL (ref 0.1–1.0)
Monocytes Relative: 5 %
NEUTROS ABS: 10.4 10*3/uL — AB (ref 1.7–7.7)
Neutrophils Relative %: 83 %
Platelets: 155 10*3/uL (ref 150–400)
RBC: 4.69 MIL/uL (ref 4.22–5.81)
RDW: 14.6 % (ref 11.5–15.5)
WBC: 12.6 10*3/uL — AB (ref 4.0–10.5)

## 2017-09-21 LAB — GLUCOSE, CAPILLARY
GLUCOSE-CAPILLARY: 224 mg/dL — AB (ref 65–99)
GLUCOSE-CAPILLARY: 170 mg/dL — AB (ref 65–99)
Glucose-Capillary: 178 mg/dL — ABNORMAL HIGH (ref 65–99)
Glucose-Capillary: 213 mg/dL — ABNORMAL HIGH (ref 65–99)

## 2017-09-21 LAB — ECHOCARDIOGRAM COMPLETE
Height: 77 in
WEIGHTICAEL: 3808 [oz_av]

## 2017-09-21 MED ORDER — PERFLUTREN LIPID MICROSPHERE
1.0000 mL | INTRAVENOUS | Status: AC | PRN
  Administered 2017-09-21: 4.5 mL via INTRAVENOUS
  Filled 2017-09-21: qty 10

## 2017-09-21 NOTE — Progress Notes (Signed)
  Echocardiogram 2D Echocardiogram with definity has been performed.  Leta Jungling M 09/21/2017, 11:15 AM

## 2017-09-21 NOTE — Consult Note (Signed)
Appears to have tolerated surgery - resume anticoagulation for a-fib when feasible from a surgical bleeding standpoint. No further suggestions. Will sign-off. Call with questions.  Chrystie Nose, MD, [facility], FACP  [place]  [facility]  Medical Director of the [facility] Lipid [facility] &  Cardiovascular Risk Reduction Clinic Diplomate of the American Board of Clinical Lipidology Attending Cardiologist  Direct Dial: [phone]  Fax: [phone]  Website:  [email or web address]

## 2017-09-21 NOTE — Progress Notes (Signed)
1 Day Post-Op   Subjective/Chief Complaint: Pt doing well tol FLD mobilizing   Objective: Vital signs in last 24 hours: Temp:  [97.3 F (36.3 C)-98.4 F (36.9 C)] 97.7 F (36.5 C) (01/24 0524) Pulse Rate:  [53-65] 54 (01/24 0524) Resp:  [9-18] 18 (01/24 0524) BP: (142-154)/(76-89) 149/76 (01/24 0524) SpO2:  [93 %-98 %] 97 % (01/24 0524) Last BM Date: 09/18/17  Intake/Output from previous day: 01/23 0701 - 01/24 0700 In: 2456.8 [P.O.:477; I.V.:1929.8; IV Piggyback:50] Out: 860 [Urine:600; Drains:230; Blood:30] Intake/Output this shift: No intake/output data recorded.  General appearance: alert and cooperative GI: soft, non-tender; bowel sounds normal; no masses,  no organomegaly and Jp SS  Lab Results:  Recent Labs    09/20/17 0745 09/21/17 0639  WBC 9.8 12.6*  HGB 14.4 15.1  HCT 42.1 44.1  PLT 130* 155   BMET Recent Labs    09/20/17 0745  NA 139  K 4.4  CL 109  CO2 21*  GLUCOSE 92  BUN 35*  CREATININE 1.99*  CALCIUM 8.2*   PT/INR No results for input(s): LABPROT, INR in the last 72 hours. ABG No results for input(s): PHART, HCO3 in the last 72 hours.  Invalid input(s): PCO2, PO2  Studies/Results: No results found.  Anti-infectives: Anti-infectives (From admission, onward)   Start     Dose/Rate Route Frequency Ordered Stop   09/19/17 1200  vancomycin (VANCOCIN) 1,500 mg in sodium chloride 0.9 % 500 mL IVPB  Status:  Discontinued     1,500 mg 250 mL/hr over 120 Minutes Intravenous  Once 09/19/17 0247 09/19/17 0633   09/19/17 0800  cefTRIAXone (ROCEPHIN) 2 g in dextrose 5 % 50 mL IVPB     2 g 100 mL/hr over 30 Minutes Intravenous Every 24 hours 09/19/17 0642     09/18/17 1100  ceFEPIme (MAXIPIME) 1 g in dextrose 5 % 50 mL IVPB  Status:  Discontinued     1 g 100 mL/hr over 30 Minutes Intravenous Every 24 hours 09/17/17 1713 09/19/17 0633   09/18/17 1100  metroNIDAZOLE (FLAGYL) IVPB 500 mg  Status:  Discontinued     500 mg 100 mL/hr over 60  Minutes Intravenous Every 8 hours 09/18/17 1058 09/19/17 1130   09/17/17 1130  vancomycin (VANCOCIN) IVPB 1000 mg/200 mL premix     1,000 mg 200 mL/hr over 60 Minutes Intravenous Every 1 hr x 2 09/17/17 1116 09/17/17 1435   09/17/17 1115  vancomycin (VANCOCIN) 2,000 mg in sodium chloride 0.9 % 500 mL IVPB  Status:  Discontinued     2,000 mg 250 mL/hr over 120 Minutes Intravenous  Once 09/17/17 1110 09/17/17 1116   09/17/17 1115  ceFEPIme (MAXIPIME) 2 g in dextrose 5 % 50 mL IVPB     2 g 100 mL/hr over 30 Minutes Intravenous  Once 09/17/17 1110 09/17/17 1232      Assessment/Plan: s/p Procedure(s): LAPAROSCOPIC SUBTOTAL CHOLECYSTECTOMY (N/A)  D/w pt the findings intraop and postop course Advance diet as tol Mobilize Path pending  LOS: 4 days    Marigene Ehlers., Jed Limerick 09/21/2017

## 2017-09-21 NOTE — Progress Notes (Signed)
PROGRESS NOTE    [name]   WUJ:[id number]  DOB: May 06, 1934  DOA: 09/17/2017 PCP: Samuel Jester, DO   Brief Narrative:  Wayne Gordon is an 84 y/o with CAD sp CABG, mitral valve repair, chronic systolic CHF with EF of 35-40%, AICD, HTN, DM2, A-fib on Apixaban, cholelithiasis who presented for vomiting, RUQ pain, weakness.  GI felt his symptoms may be due to a recently passed gallstone. He was transferred from [facility] to [facility] for a cholecystectomy.    Subjective: Not having as much pain as yesterday. Up in a chair. No nausea.  ROS: no complaints of nausea, vomiting, constipation diarrhea, cough, dyspnea or dysuria. No other complaints.   Assessment & Plan:   Vomiting/ abdominal pain/ cholelithiasis - 1/23- underwent subtotal lap chole with drain placement - gen surgery to manage  Sepsis- fever leukocytosis hypotension due to cholangitis and  Klebsiella bacteremia - 1 set of blood cultures noted to be positive today for K Pneumoniae  - cont Ceftriaxone to which it is sensitive  DM2 - cont Lantus and SSI  A-fib - resume Eliquis- cont Coreg  AKI on CKD 3 - baseline Cr about 1.9-2.2 - Cr 3.44 on admission- has improved steadily- will d/c IVF today to prevent fluid overload - Lasix and ARB on hold  Chronic systolic CHF - follow fluid status- EF 35-40% on ECHO on 2016 - Lasix has been on hold due to AKI - will d/c IVF today as he is tolerating orals well  DVT prophylaxis: SCDs Code Status: Full code Family Communication:  Disposition Plan: follow on med surg Consultants:   Cardiology  gen surgery  GI Procedures:   Lap chole  Antimicrobials:  Anti-infectives (From admission, onward)   Start     Dose/Rate Route Frequency Ordered Stop   09/19/17 1200  vancomycin (VANCOCIN) 1,500 mg in sodium chloride 0.9 % 500 mL IVPB  Status:  Discontinued     1,500 mg 250 mL/hr over 120 Minutes Intravenous  Once 09/19/17 0247 09/19/17 0633   09/19/17  0800  cefTRIAXone (ROCEPHIN) 2 g in dextrose 5 % 50 mL IVPB     2 g 100 mL/hr over 30 Minutes Intravenous Every 24 hours 09/19/17 0642     09/18/17 1100  ceFEPIme (MAXIPIME) 1 g in dextrose 5 % 50 mL IVPB  Status:  Discontinued     1 g 100 mL/hr over 30 Minutes Intravenous Every 24 hours 09/17/17 1713 09/19/17 0633   09/18/17 1100  metroNIDAZOLE (FLAGYL) IVPB 500 mg  Status:  Discontinued     500 mg 100 mL/hr over 60 Minutes Intravenous Every 8 hours 09/18/17 1058 09/19/17 1130   09/17/17 1130  vancomycin (VANCOCIN) IVPB 1000 mg/200 mL premix     1,000 mg 200 mL/hr over 60 Minutes Intravenous Every 1 hr x 2 09/17/17 1116 09/17/17 1435   09/17/17 1115  vancomycin (VANCOCIN) 2,000 mg in sodium chloride 0.9 % 500 mL IVPB  Status:  Discontinued     2,000 mg 250 mL/hr over 120 Minutes Intravenous  Once 09/17/17 1110 09/17/17 1116   09/17/17 1115  ceFEPIme (MAXIPIME) 2 g in dextrose 5 % 50 mL IVPB     2 g 100 mL/hr over 30 Minutes Intravenous  Once 09/17/17 1110 09/17/17 1232       Objective: Vitals:   09/20/17 1258 09/20/17 2044 09/21/17 0524 09/21/17 0908  BP: (!) 154/82 (!) 151/78 (!) 149/76 (!) 157/78  Pulse: (!) 55 (!) 55 (!) 54  Resp:  16 18   Temp: 97.9 F (36.6 C) 97.9 F (36.6 C) 97.7 F (36.5 C)   TempSrc: Oral Oral Oral   SpO2: 93% 94% 97%   Weight:      Height:        Intake/Output Summary (Last 24 hours) at 09/21/2017 1233 Last data filed at 09/21/2017 0934 Gross per 24 hour  Intake 2066.83 ml  Output 1055 ml  Net 1011.83 ml   Filed Weights   09/17/17 0952 09/17/17 1649 09/19/17 0445  Weight: 107.5 kg (237 lb) 114.6 kg (252 lb 10.4 oz) 108 kg (238 lb)    Examination: General exam: Appears comfortable  HEENT: PERRLA, oral mucosa moist, no sclera icterus or thrush Respiratory system: Clear to auscultation. Respiratory effort normal. Cardiovascular system: S1 & S2 heard, RRR.  No murmurs  Gastrointestinal system: Abdomen soft,  Tender, drain present with  serosanguinous drainage, nondistended. Normal bowel sound. No organomegaly Central nervous system: Alert and oriented. No focal neurological deficits. Extremities: No cyanosis, clubbing or edema Skin: No rashes or ulcers Psychiatry:  Mood & affect appropriate.   Data Reviewed: I have personally reviewed following labs and imaging studies  CBC: Recent Labs  Lab 09/17/17 1002 09/18/17 0502 09/20/17 0745 09/21/17 0639  WBC 28.8* 18.7* 9.8 12.6*  NEUTROABS 25.7* 16.1* 7.4 10.4*  HGB 15.1 13.5 14.4 15.1  HCT 44.9 39.6 42.1 44.1  MCV 94.3 92.3 92.9 94.0  PLT 123* 107* 130* 155   Basic Metabolic Panel: Recent Labs  Lab 09/17/17 1002 09/18/17 0502 09/20/17 0745 09/21/17 0639  NA 132* 129* 139 137  K 4.6 4.1 4.4 5.6*  CL 95* 98* 109 108  CO2 23 24 21* 17*  GLUCOSE 156* 120* 92 188*  BUN 41* 50* 35* 42*  CREATININE 3.44* 3.35* 1.99* 2.00*  CALCIUM 8.6* 7.5* 8.2* 8.0*  MG  --  1.6* 2.1  --    GFR: Estimated Creatinine Clearance: 39.6 mL/min (A) (by C-G formula based on SCr of 2 mg/dL (H)). Liver Function Tests: Recent Labs  Lab 09/17/17 1002 09/18/17 0502 09/20/17 0745 09/21/17 0639  AST 382* 161* 48* 42*  ALT 569* 341* 177* 138*  ALKPHOS 184* 135* 154* 146*  BILITOT 6.8* 3.6* 2.2* 2.1*  PROT 6.6 5.6* 6.0* 5.8*  ALBUMIN 3.3* 2.8* 2.8* 2.7*   Recent Labs  Lab 09/17/17 1006 09/20/17 0745  LIPASE 23 68*   No results for input(s): AMMONIA in the last 168 hours. Coagulation Profile: Recent Labs  Lab 09/18/17 0502  INR 1.86   Cardiac Enzymes: Recent Labs  Lab 09/17/17 1002 09/17/17 1853 09/18/17 0032 09/18/17 0502  TROPONINI 0.06* 0.04* 0.04* 0.04*   BNP (last 3 results) No results for input(s): PROBNP in the last 8760 hours. HbA1C: No results for input(s): HGBA1C in the last 72 hours. CBG: Recent Labs  Lab 09/20/17 1258 09/20/17 1707 09/20/17 2035 09/21/17 0747 09/21/17 1128  GLUCAP 115* 146* 176* 178* 213*   Lipid Profile: No results for  input(s): CHOL, HDL, LDLCALC, TRIG, CHOLHDL, LDLDIRECT in the last 72 hours. Thyroid Function Tests: No results for input(s): TSH, T4TOTAL, FREET4, T3FREE, THYROIDAB in the last 72 hours. Anemia Panel: No results for input(s): VITAMINB12, FOLATE, FERRITIN, TIBC, IRON, RETICCTPCT in the last 72 hours. Urine analysis:    Component Value Date/Time   COLORURINE YELLOW 09/19/2017 0437   APPEARANCEUR CLEAR 09/19/2017 0437   LABSPEC 1.004 (L) 09/19/2017 0437   PHURINE 6.0 09/19/2017 0437   GLUCOSEU NEGATIVE 09/19/2017 0437   HGBUR  SMALL (A) 09/19/2017 0437   BILIRUBINUR NEGATIVE 09/19/2017 0437   KETONESUR NEGATIVE 09/19/2017 0437   PROTEINUR NEGATIVE 09/19/2017 0437   UROBILINOGEN 0.2 02/04/2014 0939   NITRITE NEGATIVE 09/19/2017 0437   LEUKOCYTESUR NEGATIVE 09/19/2017 0437   Sepsis Labs: @LABRCNTIP (procalcitonin:4,lacticidven:4) ) Recent Results (from the past 240 hour(s))  Blood culture (routine x 2)     Status: None (Preliminary result)   Collection Time: 09/17/17 11:12 AM  Result Value Ref Range Status   Specimen Description RIGHT ANTECUBITAL  Final   Special Requests   Final    BOTTLES DRAWN AEROBIC AND ANAEROBIC Blood Culture adequate volume   Culture NO GROWTH 4 DAYS  Final   Report Status PENDING  Incomplete  Blood culture (routine x 2)     Status: Abnormal   Collection Time: 09/17/17 11:18 AM  Result Value Ref Range Status   Specimen Description BLOOD RIGHT HAND  Final   Special Requests   Final    BOTTLES DRAWN AEROBIC AND ANAEROBIC Blood Culture results may not be optimal due to an inadequate volume of blood received in culture bottles   Culture  Setup Time   Final    Gram Stain Report Called to,Read Back By and Verified With: HARRIS,B @ 0020 ON 1.22.19 BY BOWMAN,L CORRECTED RESULTS GRAM NEGATIVE RODS ANAEROBIC BOTTLE ONLY PREVIOUSLY REPORTED AS: GRAM POSITIVE RODS ANAEROBIC BOTTLE ONLY CORRECTED RESULTS CALLED TO: PHARMD G ABBOTT [id number] BY A BROWNING Performed  at [facility], [address] [place], [place] [address]    Culture KLEBSIELLA PNEUMONIAE (A)  Final   Report Status 09/21/2017 FINAL  Final   Organism ID, Bacteria KLEBSIELLA PNEUMONIAE  Final      Susceptibility   Klebsiella pneumoniae - MIC*    AMPICILLIN >=32 RESISTANT Resistant     CEFAZOLIN <=4 SENSITIVE Sensitive     CEFEPIME <=1 SENSITIVE Sensitive     CEFTAZIDIME <=1 SENSITIVE Sensitive     CEFTRIAXONE <=1 SENSITIVE Sensitive     CIPROFLOXACIN <=0.25 SENSITIVE Sensitive     GENTAMICIN <=1 SENSITIVE Sensitive     IMIPENEM <=0.25 SENSITIVE Sensitive     TRIMETH/SULFA <=20 SENSITIVE Sensitive     AMPICILLIN/SULBACTAM 8 SENSITIVE Sensitive     PIP/TAZO 8 SENSITIVE Sensitive     Extended ESBL NEGATIVE Sensitive     * KLEBSIELLA PNEUMONIAE  Blood Culture ID Panel (Reflexed)     Status: Abnormal   Collection Time: 09/17/17 11:18 AM  Result Value Ref Range Status   Enterococcus species NOT DETECTED NOT DETECTED Final   Listeria monocytogenes NOT DETECTED NOT DETECTED Final   Staphylococcus species NOT DETECTED NOT DETECTED Final   Staphylococcus aureus NOT DETECTED NOT DETECTED Final   Streptococcus species NOT DETECTED NOT DETECTED Final   Streptococcus agalactiae NOT DETECTED NOT DETECTED Final   Streptococcus pneumoniae NOT DETECTED NOT DETECTED Final   Streptococcus pyogenes NOT DETECTED NOT DETECTED Final   Acinetobacter baumannii NOT DETECTED NOT DETECTED Final   Enterobacteriaceae species DETECTED (A) NOT DETECTED Final    Comment: Enterobacteriaceae represent a large family of gram-negative bacteria, not a single organism. CRITICAL RESULT CALLED TO, READ BACK BY AND VERIFIED WITH: G ABBOTT PHARMD [id number] 09/19/17 A BROWNING    Enterobacter cloacae complex NOT DETECTED NOT DETECTED Final   Escherichia coli NOT DETECTED NOT DETECTED Final   Klebsiella oxytoca NOT DETECTED NOT DETECTED Final   Klebsiella pneumoniae DETECTED (A) NOT DETECTED Final    Comment:  CRITICAL RESULT CALLED TO, READ BACK BY  AND VERIFIED WITH: Evelena Peat PHARMD [id number] 09/19/17 A BROWNING    Proteus species NOT DETECTED NOT DETECTED Final   Serratia marcescens NOT DETECTED NOT DETECTED Final   Carbapenem resistance NOT DETECTED NOT DETECTED Final   Haemophilus influenzae NOT DETECTED NOT DETECTED Final   Neisseria meningitidis NOT DETECTED NOT DETECTED Final   Pseudomonas aeruginosa NOT DETECTED NOT DETECTED Final   Candida albicans NOT DETECTED NOT DETECTED Final   Candida glabrata NOT DETECTED NOT DETECTED Final   Candida krusei NOT DETECTED NOT DETECTED Final   Candida parapsilosis NOT DETECTED NOT DETECTED Final   Candida tropicalis NOT DETECTED NOT DETECTED Final    Comment: Performed at [facility], [address] [place], [place] [address]         Radiology Studies: No results found.   Scheduled Meds: . carvedilol  6.25 mg Oral BID WC  . feeding supplement (GLUCERNA SHAKE)  237 mL Oral TID BM  . insulin aspart  0-9 Units Subcutaneous TID WC  . insulin glargine  12 Units Subcutaneous Daily  . pantoprazole  40 mg Oral Daily   Continuous Infusions: . sodium chloride 20 mL/hr (09/21/17 0208)  . cefTRIAXone (ROCEPHIN)  IV Stopped (09/21/17 0934)     LOS: 4 days    Time spent in minutes: 35    Calvert Cantor, MD [facility] Pager: [email or web address] Password [facility] 09/21/2017, 12:33 PM

## 2017-09-21 NOTE — Care Management Important Message (Signed)
Important Message  Patient Details  Name: Wayne Gordon MRN: [id number] Date of Birth: 08-Oct-1933   Medicare Important Message Given:  Yes    Deo Mehringer 09/21/2017, 2:15 PM

## 2017-09-22 DIAGNOSIS — B961 Klebsiella pneumoniae [K. pneumoniae] as the cause of diseases classified elsewhere: Secondary | ICD-10-CM

## 2017-09-22 DIAGNOSIS — A414 Sepsis due to anaerobes: Secondary | ICD-10-CM

## 2017-09-22 DIAGNOSIS — R7881 Bacteremia: Secondary | ICD-10-CM

## 2017-09-22 LAB — COMPREHENSIVE METABOLIC PANEL
ALBUMIN: 2.6 g/dL — AB (ref 3.5–5.0)
ALK PHOS: 119 U/L (ref 38–126)
ALT: 98 U/L — ABNORMAL HIGH (ref 17–63)
ANION GAP: 11 (ref 5–15)
AST: 28 U/L (ref 15–41)
BUN: 44 mg/dL — AB (ref 6–20)
CALCIUM: 8.2 mg/dL — AB (ref 8.9–10.3)
CO2: 18 mmol/L — AB (ref 22–32)
Chloride: 107 mmol/L (ref 101–111)
Creatinine, Ser: 1.81 mg/dL — ABNORMAL HIGH (ref 0.61–1.24)
GFR calc Af Amer: 39 mL/min — ABNORMAL LOW (ref >60)
GFR calc non Af Amer: 33 mL/min — ABNORMAL LOW (ref >60)
GLUCOSE: 142 mg/dL — AB (ref 65–99)
POTASSIUM: 4.9 mmol/L (ref 3.5–5.1)
SODIUM: 136 mmol/L (ref 135–145)
Total Bilirubin: 1.5 mg/dL — ABNORMAL HIGH (ref 0.3–1.2)
Total Protein: 5.4 g/dL — ABNORMAL LOW (ref 6.5–8.1)

## 2017-09-22 LAB — CBC WITH DIFFERENTIAL/PLATELET
BASOS PCT: 0 %
Basophils Absolute: 0 10*3/uL (ref 0.0–0.1)
Eosinophils Absolute: 0.1 10*3/uL (ref 0.0–0.7)
Eosinophils Relative: 1 %
HEMATOCRIT: 43.1 % (ref 39.0–52.0)
HEMOGLOBIN: 14.5 g/dL (ref 13.0–17.0)
LYMPHS PCT: 17 %
Lymphs Abs: 1.9 10*3/uL (ref 0.7–4.0)
MCH: 31.7 pg (ref 26.0–34.0)
MCHC: 33.6 g/dL (ref 30.0–36.0)
MCV: 94.1 fL (ref 78.0–100.0)
MONOS PCT: 11 %
Monocytes Absolute: 1.2 10*3/uL — ABNORMAL HIGH (ref 0.1–1.0)
NEUTROS ABS: 7.8 10*3/uL — AB (ref 1.7–7.7)
Neutrophils Relative %: 71 %
Platelets: 159 10*3/uL (ref 150–400)
RBC: 4.58 MIL/uL (ref 4.22–5.81)
RDW: 14.7 % (ref 11.5–15.5)
WBC: 11 10*3/uL — ABNORMAL HIGH (ref 4.0–10.5)

## 2017-09-22 LAB — CULTURE, BLOOD (ROUTINE X 2)
Culture: NO GROWTH
SPECIAL REQUESTS: ADEQUATE

## 2017-09-22 LAB — GLUCOSE, CAPILLARY
GLUCOSE-CAPILLARY: 137 mg/dL — AB (ref 65–99)
Glucose-Capillary: 174 mg/dL — ABNORMAL HIGH (ref 65–99)

## 2017-09-22 MED ORDER — CEPHALEXIN 500 MG PO CAPS: 500.0000 mg | ORAL_CAPSULE | Freq: Three times a day (TID) | ORAL | 0 refills | Status: DC

## 2017-09-22 MED ORDER — APIXABAN 2.5 MG PO TABS
2.5000 mg | ORAL_TABLET | Freq: Two times a day (BID) | ORAL | Status: DC
  Administered 2017-09-22: 2.5 mg via ORAL
  Filled 2017-09-22: qty 1

## 2017-09-22 MED ORDER — APIXABAN 2.5 MG PO TABS: 2.5000 mg | ORAL_TABLET | Freq: Two times a day (BID) | ORAL | 0 refills | Status: DC

## 2017-09-22 MED ORDER — FUROSEMIDE 20 MG PO TABS: 20.0000 mg | ORAL_TABLET | Freq: Every day | ORAL | 3 refills | Status: DC

## 2017-09-22 MED ORDER — CEPHALEXIN 500 MG PO CAPS
500.0000 mg | ORAL_CAPSULE | Freq: Three times a day (TID) | ORAL | Status: DC
  Administered 2017-09-22: 500 mg via ORAL
  Filled 2017-09-22: qty 1

## 2017-09-22 NOTE — Discharge Instructions (Signed)
Weight yourself daily and call PCP if weight is rising. Can take an extra dose of Lasix (40 mg) if you gain 2-3 lbs in 24-48 hrs.     [facility] ______[facility] LAPAROSCOPIC SURGERY: POST OP INSTRUCTIONS Always review your discharge instruction sheet given to you by the facility where your surgery was performed. IF YOU HAVE DISABILITY OR FAMILY LEAVE FORMS, YOU MUST BRING THEM TO THE OFFICE FOR PROCESSING.   DO NOT GIVE THEM TO YOUR DOCTOR.  1. A prescription for pain medication may be given to you upon discharge.  Take your pain medication as prescribed, if needed.  If narcotic pain medicine is not needed, then you may take acetaminophen (Tylenol) or ibuprofen (Advil) as needed. 2. Take your usually prescribed medications unless otherwise directed. 3. If you need a refill on your pain medication, please contact your pharmacy.  They will contact our office to request authorization. Prescriptions will not be filled after 5pm or on week-ends. 4. You should follow a light diet the first few days after arrival home, such as soup and crackers, etc.  Be sure to include lots of fluids daily. 5. Most patients will experience some swelling and bruising in the area of the incisions.  Ice packs will help.  Swelling and bruising can take several days to resolve.  6. It is common to experience some constipation if taking pain medication after surgery.  Increasing fluid intake and taking a stool softener (such as Colace) will usually help or prevent this problem from occurring.  A mild laxative (Milk of Magnesia or Miralax) should be taken according to package instructions if there are no bowel movements after 48 hours. 7. Unless discharge instructions indicate otherwise, you may remove your bandages 24-48 hours after surgery, and you may shower at that time.  You may have steri-strips (small skin tapes) in place directly over the incision.  These strips should be left on the skin for 7-10 days.  If  your surgeon used skin glue on the incision, you may shower in 24 hours.  The glue will flake off over the next 2-3 weeks.  Any sutures or staples will be removed at the office during your follow-up visit. 8. ACTIVITIES:  You may resume regular (light) daily activities beginning the next day--such as daily self-care, walking, climbing stairs--gradually increasing activities as tolerated.  You may have sexual intercourse when it is comfortable.  Refrain from any heavy lifting or straining until approved by your doctor. a. You may drive when you are no longer taking prescription pain medication, you can comfortably wear a seatbelt, and you can safely maneuver your car and apply brakes. b. RETURN TO WORK:  __________________________________________________________ 9. You should see your doctor in the office for a follow-up appointment approximately 2-3 weeks after your surgery.  Make sure that you call for this appointment within a day or two after you arrive home to insure a convenient appointment time. 10. OTHER INSTRUCTIONS: __________________________________________________________________________________________________________________________ __________________________________________________________________________________________________________________________ WHEN TO CALL YOUR DOCTOR: 1. Fever over 101.0 2. Inability to urinate 3. Continued bleeding from incision. 4. Increased pain, redness, or drainage from the incision. 5. Increasing abdominal pain  The clinic staff is available to answer your questions during regular business hours.  Please dont hesitate to call and ask to speak to one of the nurses for clinical concerns.  If you have a medical emergency, go to the nearest emergency room or call 911.  A surgeon from [facility] is always on call at  the hospital. [address], [place], [place]  [address] ? [address] [address], [place], [place]   [address] [phone] ?  [phone] ? FAX [phone] Web site: [email or web address]   [facility] Surgical drains are used to remove extra fluid that normally builds up in a surgical wound after surgery. A surgical drain helps to heal a surgical wound. Different kinds of surgical drains include:  Active drains. These drains use suction to pull drainage away from the surgical wound. Drainage flows through a tube to a container outside of the body. It is important to keep the bulb or the drainage container flat (compressed) at all times, except while you empty it. Flattening the bulb or container creates suction. The two most common types of active drains are bulb drains and Hemovac drains.  Passive drains. These drains allow fluid to drain naturally, by gravity. Drainage flows through a tube to a bandage (dressing) or a container outside of the body. Passive drains do not need to be emptied. The most common type of passive drain is the Penrose drain.  A drain is placed during surgery. Immediately after surgery, drainage is usually bright red and a little thicker than water. The drainage may gradually turn yellow or pink and become thinner. It is likely that your health care provider will remove the drain when the drainage stops or when the amount decreases to 1-2 Tbsp (15-30 mL) during a 24-hour period. How to care for your surgical drain  Keep the skin around the drain dry and covered with a dressing at all times.  Check your drain area every day for signs of infection. Check for: ? More redness, swelling, or pain. ? Pus or a bad smell. ? Cloudy drainage. Follow instructions from your health care provider about how to take care of your drain and how to change your dressing. Change your dressing at least one time every day. Change it more often if needed to keep the dressing dry. Make sure you: 1. Gather your supplies, including: ? Tape. ? Germ-free cleaning solution (sterile  saline). ? Split gauze drain sponge: 4 x 4 inches (10 x 10 cm). ? Gauze square: 4 x 4 inches (10 x 10 cm). 2. Wash your hands with soap and water before you change your dressing. If soap and water are not available, use hand sanitizer. 3. Remove the old dressing. Avoid using scissors to do that. 4. Use sterile saline to clean your skin around the drain. 5. Place the tube through the slit in a drain sponge. Place the drain sponge so that it covers your wound. 6. Place the gauze square or another drain sponge on top of the drain sponge that is on the wound. Make sure the tube is between those layers. 7. Tape the dressing to your skin. 8. If you have an active bulb or Hemovac drain, tape the drainage tube to your skin 1-2 inches (2.5-5 cm) below the place where the tube enters your body. Taping keeps the tube from pulling on any stitches (sutures) that you have. 9. Wash your hands with soap and water. 10. Write down the color of your drainage and how often you change your dressing.  How to empty your active bulb or Hemovac drain 1. Make sure that you have a measuring cup that you can empty your drainage into. 2. Wash your hands with soap and water. If soap and water are not available, use hand sanitizer. 3. Gently move your fingers down  the tube while squeezing very lightly. This is called stripping the tube. This clears any drainage, clots, or tissue from the tube. ? Do not pull on the tube. ? You may need to strip the tube several times every day to keep the tube clear. 4. Open the bulb cap or the drain plug. Do not touch the inside of the cap or the bottom of the plug. 5. Empty all of the drainage into the measuring cup. 6. Compress the bulb or the container and replace the cap or the plug. To compress the bulb or the container, squeeze it firmly in the middle while you close the cap or plug the container. 7. Write down the amount of drainage that you have in each 24-hour period. If you have  less than 2 Tbsp (30 mL) of drainage during 24 hours, contact your health care provider. 8. Flush the drainage down the toilet. 9. Wash your hands with soap and water. Contact a health care provider if:  You have more redness, swelling, or pain around your drain area.  The amount of drainage that you have is increasing instead of decreasing.  You have pus or a bad smell coming from your drain area.  You have a fever.  You have drainage that is cloudy.  There is a sudden stop or a sudden decrease in the amount of drainage that you have.  Your tube falls out.  Your active draindoes not stay compressedafter you empty it. This information is not intended to replace advice given to you by your health care provider. Make sure you discuss any questions you have with your health care provider. Document Released: 08/12/2000 Document Revised: 01/21/2016 Document Reviewed: 03/04/2015 Elsevier Interactive Patient Education  2018 [employer].

## 2017-09-22 NOTE — Care Management Note (Signed)
Case Management Note  Patient Details  Name: Wayne Gordon MRN: [id number] Date of Birth: [birth year]  Subjective/Objective:                    Action/Plan:  Discussed discharge planning with patient and wife at bedside. Explained PT/OT will come work with patient and make recommendations.   Discussed home health nurse for drain care. Patient and wife comfortable caring for drain emptying, measuring and recording. They have a grand daughter close by who is a Engineer, civil (consulting).   Await PT/OT evals. Confirmed face sheet information with patient. Patient already has walker at home , dose not have a 3 in 1. Patient willing to work with PT/OT, would be agreeable to home health if recommended but at present doesn't feel he will need   Expected Discharge Date:                  Expected Discharge Plan:     In-House Referral:     Discharge planning Services  CM Consult  Post Acute Care Choice:  Home Health, Durable Medical Equipment Choice offered to:  Patient, Spouse  DME Arranged:    DME Agency:     HH Arranged:    HH Agency:     Status of Service:  In process, will continue to follow  If discussed at Long Length of Stay Meetings, dates discussed:    Additional Comments:  Kingsley Plan, RN 09/22/2017, 11:07 AM

## 2017-09-22 NOTE — Discharge Summary (Signed)
Physician Discharge Summary  Wayne Gordon WUJ:[id number] DOB: 1934/07/14 DOA: 09/17/2017  PCP: Samuel Jester, DO  Admit date: 09/17/2017 Discharge date: 09/22/2017  Admitted From: home  Disposition:  hom   Recommendations for Outpatient Follow-up:  1. Have cut back on lasix dose- please reassess BUN/Cr at follow up visit next week and decide if Lasix needs to be ajusted  Discharge Condition:  stable   CODE STATUS:  Full code  Consultations:  gen surgery      Discharge Diagnoses:  Principal Problem:   Cholangitis Active Problems:   Acute renal failure superimposed on stage 3 chronic kidney disease (HCC)   Bacteremia due to Klebsiella pneumoniae   Sepsis due to Klebsiella pneumoniae (HCC)   S/P mitral valve repair   S/P CABG x 3   Long term (current) use of anticoagulants   Chronic systolic heart failure (HCC)   Ischemic cardiomyopathy   CKD (chronic kidney disease) stage 3, GFR 30-59 ml/min (HCC)   Current use of long term anticoagulation   HTN (hypertension)   Automatic implantable cardioverter-defibrillator in situ   HLD (hyperlipidemia)   GERD (gastroesophageal reflux disease)   Body mass index (BMI) of 28.0-28.9 in adult   Diabetes (HCC)   Gonadotropin deficiency (HCC)   Diabetes mellitus, type 2 (HCC)   Atrial fibrillation (HCC)   ICD (implantable cardioverter-defibrillator) discharge   Carotid artery disease (HCC)   Cholelithiasis   Hypotension   Lactic acidosis   Elevated troponin    Subjective: No complaints of pain today. Eating and drinking well.   Brief Summary: Wayne Gordon is an 84 y/o with CAD sp CABG, mitral valve repair, chronic systolic CHF with EF of 35-40%, AICD, HTN, DM2, A-fib on Apixaban, cholelithiasis who presented for vomiting, RUQ pain, weakness.  GI felt his symptoms may be due to a recently passed gallstone. He was transferred from [facility] for a cholecystectomy.    Hospital Course:  Vomiting/  abdominal pain/ cholelithiasis - 1/23- underwent subtotal lap chole with drain placement which he will go home with - gen surgery to follow up as outpt   Sepsis- fever leukocytosis hypotension due to cholangitis and  Klebsiella bacteremia -  blood cultures noted to be positive  for K Pneumoniae  - has been on Ceftriaxone to which it is sensitive- can switch to Keflex for a total of 14 days  DM2 - cont Lantus and SSI  A-fib - resume Eliquis- cont Coreg  AKI on CKD 3 - baseline Cr about 1.9-2.2 - Cr 3.44 on admission- has improved steadily to 1.81 - Lasix and ARB have been on hold  Chronic systolic CHF - following fluid status- EF 35-40% on ECHO on 2016 - Lasix has been on hold due to AKI  -Cr not back to baseline completely- will lower the dose of Lasix and it can be adjusted after he follows back with his PCP in 1 wk    Discharge Exam: Vitals:   09/22/17 1000 09/22/17 1443  BP: 130/73 (!) 146/75  Pulse: (!) 55 (!) 51  Resp:    Temp: 97.7 F (36.5 C) 98.3 F (36.8 C)  SpO2: 99% 99%   Vitals:   09/22/17 0241 09/22/17 0548 09/22/17 1000 09/22/17 1443  BP: 137/79 (!) 141/81 130/73 (!) 146/75  Pulse: (!) 55 (!) 55 (!) 55 (!) 51  Resp: 18 18    Temp: 97.6 F (36.4 C) 97.7 F (36.5 C) 97.7 F (36.5 C) 98.3 F (36.8 C)  TempSrc: Oral Oral Oral Oral  SpO2: 97% 96% 99% 99%  Weight:      Height:        General: Pt is alert, awake, not in acute distress Cardiovascular: RRR, S1/S2 +, no rubs, no gallops Respiratory: CTA bilaterally, no wheezing, no rhonchi Abdominal: Soft, NT, ND, bowel sounds +- drain present in RUQ with mild amount of blood tinged fluid in it Extremities: no edema, no cyanosis   Discharge Instructions  Discharge Instructions    Diet - low sodium heart healthy   Complete by:  As directed    Diet Carb Modified   Complete by:  As directed    Increase activity slowly   Complete by:  As directed      Allergies as of 09/22/2017       Reactions   Ace Inhibitors Swelling, Other (See Comments)   Kidney issues; angioedema per PMH in chart   Penicillins Palpitations          Medication List    TAKE these medications   apixaban 5 MG Tabs tablet Commonly known as:  ELIQUIS Take 1 tablet (5 mg total) 2 (two) times daily by mouth.   CALCIUM-MAGNESIUM-ZINC PO Take 2 tablets by mouth daily.   carvedilol 6.25 MG tablet Commonly known as:  COREG Take 1 tablet (6.25 mg total) by mouth 2 (two) times daily. What changed:  how much to take   cephALEXin 500 MG capsule Commonly known as:  KEFLEX Take 1 capsule (500 mg total) by mouth every 8 (eight) hours.   DEPO-TESTOSTERONE 200 MG/ML injection Generic drug:  testosterone cypionate Inject 0.5 mg into the muscle every 21 ( twenty-one) days. INJECT 0.6ML- 0.8ML EVERY 2 WEEKS   furosemide 20 MG tablet Commonly known as:  LASIX Take 1 tablet (20 mg total) by mouth daily. What changed:    how much to take  how to take this  when to take this  additional instructions   glipiZIDE 10 MG 24 hr tablet Commonly known as:  GLUCOTROL XL Take 10 mg by mouth 2 times daily at 12 noon and 4 pm. Pt takes it in the AM and PM   hydrOXYzine 25 MG tablet Commonly known as:  ATARAX/VISTARIL Take 1 tablet by mouth 3 (three) times daily.   losartan 25 MG tablet Commonly known as:  COZAAR Take 1 tablet (25 mg total) by mouth daily.   multivitamins ther. w/minerals Tabs tablet Take 1 tablet by mouth daily.   pantoprazole 40 MG tablet Commonly known as:  PROTONIX Take 1 tablet by mouth daily.   rosuvastatin 20 MG tablet Commonly known as:  CRESTOR Take 20 mg by mouth every evening.   TOUJEO SOLOSTAR 300 UNIT/ML Sopn Generic drug:  Insulin Glargine Inject 12-15 Units as directed daily.            Durable Medical Equipment  (From admission, onward)        Start     Ordered   09/22/17 1408  For home use only DME Tub bench  Once     09/22/17 1407   09/22/17 1407   For home use only DME Walker rolling  Once    Question:  Patient needs a walker to treat with the following condition  Answer:  Cholangitis   09/22/17 1407     Follow-up Information    Samuel Jester, DO Follow up in 1 month(s).   Why:  please ask her if your Lasix should be resumed at the previous dose -  she will need to check the following blood work : Advertising account planner information: [address] [place] [address] [phone]        Surgery, [place]. Go on 10/02/2017.   Specialty:  General Surgery Why:  You will see our PA and nurse today.  Your appointment is at 1:30 PM be there 30 minutes early for check-in. They are going to look at your drain.  If you are draining less than 30 cc/day and it is clear serous fluid.  We will remove the drain today. Contact information: [address] [place] [address] [phone]        Axel Filler, MD Follow up on 10/04/2017.   Specialty:  General Surgery Why:  Your appointment is at 12 noon.  Be at the office 30 minutes early for check-in.  Bring a Building services engineer ID and insurance information. Contact information: [address] [place] [address] [phone]             Procedures/Studies:  subtotal lap cholecystectomy   Ct Abdomen Pelvis Wo Contrast  Result Date: 09/17/2017 CLINICAL DATA:  84 year old male with abdominal pain, nausea vomiting this morning. Difficulty urinating, dark colored urine. EXAM: CT ABDOMEN AND PELVIS WITHOUT CONTRAST TECHNIQUE: Multidetector CT imaging of the abdomen and pelvis was performed following the standard protocol without IV contrast. COMPARISON:  CT Abdomen and Pelvis 02/03/2014. FINDINGS: Lower chest: Cardiomegaly. Calcified coronary artery atherosclerosis. Prior sternotomy. No pericardial effusion. Chronic pulmonary architectural distortion in the right lower lobe. Superimposed chronic elevation of the right hemidiaphragm, with right middle and lower lobe  atelectasis. Superimposed other mild dependent pulmonary atelectasis. No pleural effusion. Hepatobiliary: Cholelithiasis (series 2, image 12). No pericholecystic inflammation. Negative visible noncontrast CT. Pancreas: Chronic pancreatic atrophy. Spleen: Negative. Adrenals/Urinary Tract: Normal adrenal glands. Chronic bilateral perinephric stranding which is nonspecific. No hydronephrosis. Both ureters are decompressed. No urologic calculus identified. Decompressed and unremarkable urinary bladder. No perivesical stranding. Stomach/Bowel: Retained low-density stool in the rectum and distal sigmoid colon. Chronic sigmoid redundancy with severe diverticulosis throughout the mid and proximal sigmoid. Difficult to exclude mild inflammation at the junction of the descending and sigmoid colon (coronal image 47), although this is similar appearance to the 2015 CT. Severe diverticulosis continues in the left colon and throughout the transverse colon. No active inflammation. Oral contrast has reached the cecum which is on a lax mesentery in the anterior right abdomen. Negative cecum and appendix. Negative terminal ileum. No dilated small bowel. Moderately distended stomach with oral contrast. Negative duodenum. No abdominal free fluid or free air. Vascular/Lymphatic: Extensive Aortoiliac calcified atherosclerosis. Vascular patency is not evaluated in the absence of IV contrast. No lymphadenopathy. Reproductive: Negative. Other: No pelvic free fluid. Musculoskeletal: Advanced lumbar spine degeneration. Chronic lumbar spinal stenosis. No acute osseous abnormality identified. IMPRESSION: 1. Extensive large bowel diverticulosis from the hepatic flexure to the sigmoid. Difficult to exclude mild acute diverticulitis at the junction of the descending and sigmoid colon in the left lower quadrant, but the appearance is stable to a 2015 CT. 2. Cholelithiasis.  No CT evidence of acute cholecystitis. 3. No other acute or inflammatory  findings identified in the abdomen or pelvis. Normal appendix. 4. Aortic Atherosclerosis (ICD10-I70.0). Electronically Signed   By: Odessa Fleming M.D.   On: 09/17/2017 14:51   Dg Chest 2 View  Result Date: 09/17/2017 CLINICAL DATA:  84 year old male with history respiratory distress. Evaluate for pneumonia. EXAM: CHEST  2 VIEW COMPARISON:  Chest x-ray 07/10/2015. FINDINGS:  Lung volumes are extremely low. Chronic elevation of the right hemidiaphragm. Bibasilar opacities are slightly to represent areas of subsegmental atelectasis. No definite consolidative airspace disease. No pleural effusions. No evidence of pulmonary edema. Heart size is normal. Upper mediastinal contours are distorted by patient positioning. Aortic atherosclerosis. Status post median sternotomy for CABG mitral annuloplasty. Left-sided pacemaker/AICD device in place with lead tips projecting over the expected location of the right ventricular apex. IMPRESSION: 1. Low lung volumes with probable bibasilar subsegmental atelectasis. No radiographic evidence of acute cardiopulmonary disease. 2. Chronic elevation of the right hemidiaphragm is unchanged. 3. Aortic atherosclerosis. Electronically Signed   By: Trudie Reed M.D.   On: 09/17/2017 14:41   US Abdomen Limited Ruq  Result Date: 09/17/2017 CLINICAL DATA:  Nausea and vomiting with upper abdominal pain EXAM: ULTRASOUND ABDOMEN LIMITED RIGHT UPPER QUADRANT COMPARISON:  Abdominal ultrasound Jan 12, 2017 FINDINGS: Gallbladder: Within the gallbladder, there are echogenic foci which shadow but do not convincingly move consistent with adherent gallstones. Largest apparent gallstone measures 10 mm. Gallbladder appears contracted. No gallbladder wall edema or pericholecystic fluid. No sonographic Murphy sign noted by sonographer. Common bile duct: Diameter: 4 mm. No intrahepatic or extrahepatic biliary duct dilatation. Liver: No focal lesion identified. Within normal limits in parenchymal  echogenicity. Portal vein is patent on color Doppler imaging with normal direction of blood flow towards the liver. IMPRESSION: Cholelithiasis with gallbladder somewhat contracted. No gallbladder wall edema or pericholecystic fluid. It may be prudent in this circumstance to consider nuclear medicine hepatobiliary imaging study to assess for cystic duct patency. Study otherwise unremarkable. Electronically Signed   By: Bretta Bang III M.D.   On: 09/17/2017 13:04     The results of significant diagnostics from this hospitalization (including imaging, microbiology, ancillary and laboratory) are listed below for reference.     Microbiology: Recent Results (from the past 240 hour(s))  Blood culture (routine x 2)     Status: None   Collection Time: 09/17/17 11:12 AM  Result Value Ref Range Status   Specimen Description RIGHT ANTECUBITAL  Final   Special Requests   Final    BOTTLES DRAWN AEROBIC AND ANAEROBIC Blood Culture adequate volume   Culture NO GROWTH 5 DAYS  Final   Report Status 09/22/2017 FINAL  Final  Blood culture (routine x 2)     Status: Abnormal   Collection Time: 09/17/17 11:18 AM  Result Value Ref Range Status   Specimen Description BLOOD RIGHT HAND  Final   Special Requests   Final    BOTTLES DRAWN AEROBIC AND ANAEROBIC Blood Culture results may not be optimal due to an inadequate volume of blood received in culture bottles   Culture  Setup Time   Final    Gram Stain Report Called to,Read Back By and Verified With: HARRIS,B @ 0020 ON 1.22.19 BY BOWMAN,L CORRECTED RESULTS GRAM NEGATIVE RODS ANAEROBIC BOTTLE ONLY PREVIOUSLY REPORTED AS: GRAM POSITIVE RODS ANAEROBIC BOTTLE ONLY CORRECTED RESULTS CALLED TO: PHARMD G ABBOTT [id number] 0540 BY A BROWNING Performed at [facility], [address] [place], [place] [address]    Culture KLEBSIELLA PNEUMONIAE (A)  Final   Report Status 09/21/2017 FINAL  Final   Organism ID, Bacteria KLEBSIELLA PNEUMONIAE  Final       Susceptibility   Klebsiella pneumoniae - MIC*    AMPICILLIN >=32 RESISTANT Resistant     CEFAZOLIN <=4 SENSITIVE Sensitive     CEFEPIME <=1 SENSITIVE Sensitive     CEFTAZIDIME <=1 SENSITIVE Sensitive  CEFTRIAXONE <=1 SENSITIVE Sensitive     CIPROFLOXACIN <=0.25 SENSITIVE Sensitive     GENTAMICIN <=1 SENSITIVE Sensitive     IMIPENEM <=0.25 SENSITIVE Sensitive     TRIMETH/SULFA <=20 SENSITIVE Sensitive     AMPICILLIN/SULBACTAM 8 SENSITIVE Sensitive     PIP/TAZO 8 SENSITIVE Sensitive     Extended ESBL NEGATIVE Sensitive     * KLEBSIELLA PNEUMONIAE  Blood Culture ID Panel (Reflexed)     Status: Abnormal   Collection Time: 09/17/17 11:18 AM  Result Value Ref Range Status   Enterococcus species NOT DETECTED NOT DETECTED Final   Listeria monocytogenes NOT DETECTED NOT DETECTED Final   Staphylococcus species NOT DETECTED NOT DETECTED Final   Staphylococcus aureus NOT DETECTED NOT DETECTED Final   Streptococcus species NOT DETECTED NOT DETECTED Final   Streptococcus agalactiae NOT DETECTED NOT DETECTED Final   Streptococcus pneumoniae NOT DETECTED NOT DETECTED Final   Streptococcus pyogenes NOT DETECTED NOT DETECTED Final   Acinetobacter baumannii NOT DETECTED NOT DETECTED Final   Enterobacteriaceae species DETECTED (A) NOT DETECTED Final    Comment: Enterobacteriaceae represent a large family of gram-negative bacteria, not a single organism. CRITICAL RESULT CALLED TO, READ BACK BY AND VERIFIED WITH: G ABBOTT PHARMD [id number] 09/19/17 A BROWNING    Enterobacter cloacae complex NOT DETECTED NOT DETECTED Final   Escherichia coli NOT DETECTED NOT DETECTED Final   Klebsiella oxytoca NOT DETECTED NOT DETECTED Final   Klebsiella pneumoniae DETECTED (A) NOT DETECTED Final    Comment: CRITICAL RESULT CALLED TO, READ BACK BY AND VERIFIED WITH: Evelena Peat PHARMD [id number] 09/19/17 A BROWNING    Proteus species NOT DETECTED NOT DETECTED Final   Serratia marcescens NOT DETECTED NOT DETECTED Final    Carbapenem resistance NOT DETECTED NOT DETECTED Final   Haemophilus influenzae NOT DETECTED NOT DETECTED Final   Neisseria meningitidis NOT DETECTED NOT DETECTED Final   Pseudomonas aeruginosa NOT DETECTED NOT DETECTED Final   Candida albicans NOT DETECTED NOT DETECTED Final   Candida glabrata NOT DETECTED NOT DETECTED Final   Candida krusei NOT DETECTED NOT DETECTED Final   Candida parapsilosis NOT DETECTED NOT DETECTED Final   Candida tropicalis NOT DETECTED NOT DETECTED Final    Comment: Performed at [facility], [address] [place], [place] [address]     Labs: BNP (last 3 results) No results for input(s): BNP in the last 8760 hours. Basic Metabolic Panel: Recent Labs  Lab 09/17/17 1002 09/18/17 0502 09/20/17 0745 09/21/17 0639 09/22/17 0534  NA 132* 129* 139 137 136  K 4.6 4.1 4.4 5.6* 4.9  CL 95* 98* 109 108 107  CO2 23 24 21* 17* 18*  GLUCOSE 156* 120* 92 188* 142*  BUN 41* 50* 35* 42* 44*  CREATININE 3.44* 3.35* 1.99* 2.00* 1.81*  CALCIUM 8.6* 7.5* 8.2* 8.0* 8.2*  MG  --  1.6* 2.1  --   --    Liver Function Tests: Recent Labs  Lab 09/17/17 1002 09/18/17 0502 09/20/17 0745 09/21/17 0639 09/22/17 0534  AST 382* 161* 48* 42* 28  ALT 569* 341* 177* 138* 98*  ALKPHOS 184* 135* 154* 146* 119  BILITOT 6.8* 3.6* 2.2* 2.1* 1.5*  PROT 6.6 5.6* 6.0* 5.8* 5.4*  ALBUMIN 3.3* 2.8* 2.8* 2.7* 2.6*   Recent Labs  Lab 09/17/17 1006 09/20/17 0745  LIPASE 23 68*   No results for input(s): AMMONIA in the last 168 hours. CBC: Recent Labs  Lab 09/17/17 1002 09/18/17 0502 09/20/17 0745 09/21/17 1191 09/22/17 4782  WBC 28.8* 18.7* 9.8 12.6* 11.0*  NEUTROABS 25.7* 16.1* 7.4 10.4* 7.8*  HGB 15.1 13.5 14.4 15.1 14.5  HCT 44.9 39.6 42.1 44.1 43.1  MCV 94.3 92.3 92.9 94.0 94.1  PLT 123* 107* 130* 155 159   Cardiac Enzymes: Recent Labs  Lab 09/17/17 1002 09/17/17 1853 09/18/17 0032 09/18/17 0502  TROPONINI 0.06* 0.04* 0.04* 0.04*   BNP: Invalid  input(s): POCBNP CBG: Recent Labs  Lab 09/21/17 1128 09/21/17 1647 09/21/17 2122 09/22/17 0759 09/22/17 1144  GLUCAP 213* 224* 170* 137* 174*   D-Dimer No results for input(s): DDIMER in the last 72 hours. Hgb A1c No results for input(s): HGBA1C in the last 72 hours. Lipid Profile No results for input(s): CHOL, HDL, LDLCALC, TRIG, CHOLHDL, LDLDIRECT in the last 72 hours. Thyroid function studies No results for input(s): TSH, T4TOTAL, T3FREE, THYROIDAB in the last 72 hours.  Invalid input(s): FREET3 Anemia work up No results for input(s): VITAMINB12, FOLATE, FERRITIN, TIBC, IRON, RETICCTPCT in the last 72 hours. Urinalysis    Component Value Date/Time   COLORURINE YELLOW 09/19/2017 0437   APPEARANCEUR CLEAR 09/19/2017 0437   LABSPEC 1.004 (L) 09/19/2017 0437   PHURINE 6.0 09/19/2017 0437   GLUCOSEU NEGATIVE 09/19/2017 0437   HGBUR SMALL (A) 09/19/2017 0437   BILIRUBINUR NEGATIVE 09/19/2017 0437   KETONESUR NEGATIVE 09/19/2017 0437   PROTEINUR NEGATIVE 09/19/2017 0437   UROBILINOGEN 0.2 02/04/2014 0939   NITRITE NEGATIVE 09/19/2017 0437   LEUKOCYTESUR NEGATIVE 09/19/2017 0437   Sepsis Labs Invalid input(s): PROCALCITONIN,  WBC,  LACTICIDVEN Microbiology Recent Results (from the past 240 hour(s))  Blood culture (routine x 2)     Status: None   Collection Time: 09/17/17 11:12 AM  Result Value Ref Range Status   Specimen Description RIGHT ANTECUBITAL  Final   Special Requests   Final    BOTTLES DRAWN AEROBIC AND ANAEROBIC Blood Culture adequate volume   Culture NO GROWTH 5 DAYS  Final   Report Status 09/22/2017 FINAL  Final  Blood culture (routine x 2)     Status: Abnormal   Collection Time: 09/17/17 11:18 AM  Result Value Ref Range Status   Specimen Description BLOOD RIGHT HAND  Final   Special Requests   Final    BOTTLES DRAWN AEROBIC AND ANAEROBIC Blood Culture results may not be optimal due to an inadequate volume of blood received in culture bottles   Culture   Setup Time   Final    Gram Stain Report Called to,Read Back By and Verified With: HARRIS,B @ 0020 ON 1.22.19 BY BOWMAN,L CORRECTED RESULTS GRAM NEGATIVE RODS ANAEROBIC BOTTLE ONLY PREVIOUSLY REPORTED AS: GRAM POSITIVE RODS ANAEROBIC BOTTLE ONLY CORRECTED RESULTS CALLED TO: PHARMD G ABBOTT [id number] 0540 BY A BROWNING Performed at [facility], [address], [place], [place] [address]    Culture KLEBSIELLA PNEUMONIAE (A)  Final   Report Status 09/21/2017 FINAL  Final   Organism ID, Bacteria KLEBSIELLA PNEUMONIAE  Final      Susceptibility   Klebsiella pneumoniae - MIC*    AMPICILLIN >=32 RESISTANT Resistant     CEFAZOLIN <=4 SENSITIVE Sensitive     CEFEPIME <=1 SENSITIVE Sensitive     CEFTAZIDIME <=1 SENSITIVE Sensitive     CEFTRIAXONE <=1 SENSITIVE Sensitive     CIPROFLOXACIN <=0.25 SENSITIVE Sensitive     GENTAMICIN <=1 SENSITIVE Sensitive     IMIPENEM <=0.25 SENSITIVE Sensitive     TRIMETH/SULFA <=20 SENSITIVE Sensitive     AMPICILLIN/SULBACTAM 8 SENSITIVE Sensitive  PIP/TAZO 8 SENSITIVE Sensitive     Extended ESBL NEGATIVE Sensitive     * KLEBSIELLA PNEUMONIAE  Blood Culture ID Panel (Reflexed)     Status: Abnormal   Collection Time: 09/17/17 11:18 AM  Result Value Ref Range Status   Enterococcus species NOT DETECTED NOT DETECTED Final   Listeria monocytogenes NOT DETECTED NOT DETECTED Final   Staphylococcus species NOT DETECTED NOT DETECTED Final   Staphylococcus aureus NOT DETECTED NOT DETECTED Final   Streptococcus species NOT DETECTED NOT DETECTED Final   Streptococcus agalactiae NOT DETECTED NOT DETECTED Final   Streptococcus pneumoniae NOT DETECTED NOT DETECTED Final   Streptococcus pyogenes NOT DETECTED NOT DETECTED Final   Acinetobacter baumannii NOT DETECTED NOT DETECTED Final   Enterobacteriaceae species DETECTED (A) NOT DETECTED Final    Comment: Enterobacteriaceae represent a large family of gram-negative bacteria, not a single organism. CRITICAL RESULT  CALLED TO, READ BACK BY AND VERIFIED WITH: G ABBOTT PHARMD [id number] 09/19/17 A BROWNING    Enterobacter cloacae complex NOT DETECTED NOT DETECTED Final   Escherichia coli NOT DETECTED NOT DETECTED Final   Klebsiella oxytoca NOT DETECTED NOT DETECTED Final   Klebsiella pneumoniae DETECTED (A) NOT DETECTED Final    Comment: CRITICAL RESULT CALLED TO, READ BACK BY AND VERIFIED WITH: Evelena Peat PHARMD 1191 09/19/17 A BROWNING    Proteus species NOT DETECTED NOT DETECTED Final   Serratia marcescens NOT DETECTED NOT DETECTED Final   Carbapenem resistance NOT DETECTED NOT DETECTED Final   Haemophilus influenzae NOT DETECTED NOT DETECTED Final   Neisseria meningitidis NOT DETECTED NOT DETECTED Final   Pseudomonas aeruginosa NOT DETECTED NOT DETECTED Final   Candida albicans NOT DETECTED NOT DETECTED Final   Candida glabrata NOT DETECTED NOT DETECTED Final   Candida krusei NOT DETECTED NOT DETECTED Final   Candida parapsilosis NOT DETECTED NOT DETECTED Final   Candida tropicalis NOT DETECTED NOT DETECTED Final    Comment: Performed at [facility], [address] [place], [place] [address]     Time coordinating discharge: Over 30 minutes  SIGNED:   Calvert Cantor, MD  [facility] 09/22/2017, 3:31 PM Pager   If 7PM-7AM, please contact night-coverage [email or web address] Password [email or web address]

## 2017-09-22 NOTE — Progress Notes (Signed)
2 Days Post-Op    CC: Abdominal pain  Subjective: He is up in the chair and tolerating diet.  Drainage from his JP is still serosanguineous.  He is not been up walking her mobilized much at all.  Drain put out about 40 mL yesterday.  Objective: Vital signs in last 24 hours: Temp:  [97.6 F (36.4 C)-97.9 F (36.6 C)] 97.7 F (36.5 C) (01/25 0548) Pulse Rate:  [53-55] 55 (01/25 0548) Resp:  [18] 18 (01/25 0548) BP: (116-144)/(60-81) 141/81 (01/25 0548) SpO2:  [95 %-98 %] 96 % (01/25 0548) Last BM Date: 09/20/17 720 p.o. 50 IV 1750 urine Drained 40 Afebrile vital signs are stable Creatinine improving 1.81  LFTs improving WBC still slightly elevated at 11.0. No films  Intake/Output from previous day: 01/24 0701 - 01/25 0700 In: 770 [P.O.:720; IV Piggyback:50] Out: 1905 [Urine:1750; Drains:40; Blood:115] Intake/Output this shift: Total I/O In: 300 [P.O.:300] Out: -   General appearance: alert, cooperative and no distress GI: Soft, sore, port sites look okay drain is serosanguineous, positive bowel sounds.  Tolerating diet  Lab Results:  Recent Labs    09/21/17 0639 09/22/17 0534  WBC 12.6* 11.0*  HGB 15.1 14.5  HCT 44.1 43.1  PLT 155 159    BMET Recent Labs    09/21/17 0639 09/22/17 0534  NA 137 136  K 5.6* 4.9  CL 108 107  CO2 17* 18*  GLUCOSE 188* 142*  BUN 42* 44*  CREATININE 2.00* 1.81*  CALCIUM 8.0* 8.2*   PT/INR No results for input(s): LABPROT, INR in the last 72 hours.  Recent Labs  Lab 09/17/17 1002 09/18/17 0502 09/20/17 0745 09/21/17 0639 09/22/17 0534  AST 382* 161* 48* 42* 28  ALT 569* 341* 177* 138* 98*  ALKPHOS 184* 135* 154* 146* 119  BILITOT 6.8* 3.6* 2.2* 2.1* 1.5*  PROT 6.6 5.6* 6.0* 5.8* 5.4*  ALBUMIN 3.3* 2.8* 2.8* 2.7* 2.6*     Lipase     Component Value Date/Time   LIPASE 68 (H) 09/20/2017 0745     Medications: . carvedilol  6.25 mg Oral BID WC  . feeding supplement (GLUCERNA SHAKE)  237 mL Oral TID BM  .  insulin aspart  0-9 Units Subcutaneous TID WC  . insulin glargine  12 Units Subcutaneous Daily  . pantoprazole  40 mg Oral Daily   . cefTRIAXone (ROCEPHIN)  IV Stopped (09/22/17 0815)   Anti-infectives (From admission, onward)   Start     Dose/Rate Route Frequency Ordered Stop   09/19/17 1200  vancomycin (VANCOCIN) 1,500 mg in sodium chloride 0.9 % 500 mL IVPB  Status:  Discontinued     1,500 mg 250 mL/hr over 120 Minutes Intravenous  Once 09/19/17 0247 09/19/17 0633   09/19/17 0800  cefTRIAXone (ROCEPHIN) 2 g in dextrose 5 % 50 mL IVPB     2 g 100 mL/hr over 30 Minutes Intravenous Every 24 hours 09/19/17 0642     09/18/17 1100  ceFEPIme (MAXIPIME) 1 g in dextrose 5 % 50 mL IVPB  Status:  Discontinued     1 g 100 mL/hr over 30 Minutes Intravenous Every 24 hours 09/17/17 1713 09/19/17 0633   09/18/17 1100  metroNIDAZOLE (FLAGYL) IVPB 500 mg  Status:  Discontinued     500 mg 100 mL/hr over 60 Minutes Intravenous Every 8 hours 09/18/17 1058 09/19/17 1130   09/17/17 1130  vancomycin (VANCOCIN) IVPB 1000 mg/200 mL premix     1,000 mg 200 mL/hr over 60 Minutes Intravenous Every  1 hr x 2 09/17/17 1116 09/17/17 1435   09/17/17 1115  vancomycin (VANCOCIN) 2,000 mg in sodium chloride 0.9 % 500 mL IVPB  Status:  Discontinued     2,000 mg 250 mL/hr over 120 Minutes Intravenous  Once 09/17/17 1110 09/17/17 1116   09/17/17 1115  ceFEPIme (MAXIPIME) 2 g in dextrose 5 % 50 mL IVPB     2 g 100 mL/hr over 30 Minutes Intravenous  Once 09/17/17 1110 09/17/17 1232    Pathology report: Gallbladder - CHRONIC AND FOCAL ACUTE CHOLECYSTITIS.  Assessment/Plan Cholangitis/Cholelithiasis Laparoscopic subtotal cholecystectomy and drain placement, 09/20/17 Dr. Axel Filler.  CAD/CABG/MVR/Cardiomyopathylast EF 25%/AICD Atrial fibrillation Chronic anticoagulation - Eliquis 09/17/17 Chronic kidney disease  PVOD Chronic back pain - limits mobility some Hypertension  FEN: Carb modified diet ID:   Vancomycin 1/20 - 07/20/18, Maxipime 1/20-1/21/19,   Rocephin  09/19/17 =>>  Day 4 DVT:   SCDs only Foley:  None Follow up:   We will then have him follow-up with Dr. Derrell Lolling week after that in the office.  Plan: OT and PT to help mobilize.  Case manager and set up home health to assist with the drain.  We will check on restarting Eliquis, and antibiotic duration.  His WBC is almost normal. Plans are to discharge him home hopefully this weekend, follow-up in our office next week to check the drain.  If there is less than 30 cc and it serosanguineous to pull it in the office.      LOS: 5 days    Wayne Gordon 09/22/2017 [phone]

## 2017-09-22 NOTE — Progress Notes (Signed)
Discharge home. Home discharge instruction given, no question verbalized. 

## 2017-09-22 NOTE — Evaluation (Signed)
Physical Therapy Evaluation Patient Details Name: Wayne Gordon MRN: [id number] DOB: 1934/05/15 Today's Date: 09/22/2017   History of Present Illness  Pt is an 84 y/o male admitted secondary to cholangitis. Pt is s/p laparascopic cholecystectomy. PMH inlcudes DM, a fib, CHF, CAD s/p CABG, CKD, ischemic cardiomyopathy, HTN, s/p defibrilator placement, and s/p MVR.   Clinical Impression  Pt is s/p surgery above with deficits below. Tolerated ambulation well, however, unsteady without use of AD. Increased steadiness noted with use of RW and agreeable to using at home. Pt will have 24/7 assist from his wife and son at home. Education completed about DME recommendations and assist required at home. Will continue to follow acutely to maximize functional mobility independence and safety.     Follow Up Recommendations Home health PT;Supervision for mobility/OOB    Equipment Recommendations  Rolling walker with 5" wheels;Other (comment)(tub bench )    Recommendations for Other Services       Precautions / Restrictions Precautions Precautions: Fall Restrictions Weight Bearing Restrictions: No      Mobility  Bed Mobility               General bed mobility comments: In chair upon entry. Educated about using log roll technique to decrease stress on abdomen.   Transfers Overall transfer level: Needs assistance Equipment used: None Transfers: Sit to/from Stand Sit to Stand: Min guard         General transfer comment: Min guard for safety. Increased time required to perfrom.   Ambulation/Gait Ambulation/Gait assistance: Min guard;Min assist Ambulation Distance (Feet): 125 Feet Assistive device: Rolling walker (2 wheeled);None Gait Pattern/deviations: Step-through pattern;Decreased stride length Gait velocity: Decreased  Gait velocity interpretation: Below normal speed for age/gender General Gait Details: Slow, unsteady gait without use of AD; required min A for steadying. With  use of RW, increased stability and only required min guard for safety. Educated to use at home to increase stability. Verbal cues for upright posture and proximity to device.   Stairs Stairs: Yes Stairs assistance: Min assist Stair Management: Step to pattern;Forwards(HHA; Wall on L) Number of Stairs: 3 General stair comments: Min A and HHA for steadying throughout stair navigation. Used wall on the L for steadying as well. Educated about assist at home required, and pt reports grandaughter who is taking him home is an Charity fundraiser that will help him.   Wheelchair Mobility    Modified Rankin (Stroke Patients Only)       Balance Overall balance assessment: Needs assistance Sitting-balance support: No upper extremity supported;Feet supported Sitting balance-Leahy Scale: Good     Standing balance support: Bilateral upper extremity supported;No upper extremity supported;During functional activity Standing balance-Leahy Scale: Fair Standing balance comment: Able to maintain static standing without UE support.                              Pertinent Vitals/Pain Pain Assessment: 0-10 Pain Score: 4  Pain Location: stomach  Pain Descriptors / Indicators: Sore Pain Intervention(s): Monitored during session;Limited activity within patient's tolerance;Repositioned    Home Living Family/patient expects to be discharged to:: Private residence Living Arrangements: Spouse/significant other;Children Available Help at Discharge: Family;Available 24 hours/day Type of Home: House Home Access: Stairs to enter Entrance Stairs-Rails: None Entrance Stairs-Number of Steps: 3 Home Layout: One level Home Equipment: None      Prior Function Level of Independence: Independent  Hand Dominance   Dominant Hand: Right    Extremity/Trunk Assessment   Upper Extremity Assessment Upper Extremity Assessment: Defer to OT evaluation    Lower Extremity Assessment Lower  Extremity Assessment: Generalized weakness    Cervical / Trunk Assessment Cervical / Trunk Assessment: Kyphotic  Communication   Communication: No difficulties  Cognition Arousal/Alertness: Awake/alert Behavior During Therapy: WFL for tasks assessed/performed Overall Cognitive Status: Within Functional Limits for tasks assessed                                        General Comments General comments (skin integrity, edema, etc.): Pt's wife present during session. Educated about HHPT recommendations and pt agreeable.     Exercises     Assessment/Plan    PT Assessment Patient needs continued PT services  PT Problem List Decreased strength;Decreased balance;Decreased mobility;Decreased knowledge of use of DME;Pain       PT Treatment Interventions DME instruction;Gait training;Stair training;Functional mobility training;Neuromuscular re-education;Balance training;Therapeutic exercise;Therapeutic activities;Patient/family education    PT Goals (Current goals can be found in the Care Plan section)  Acute Rehab PT Goals Patient Stated Goal: to go home today  PT Goal Formulation: With patient Time For Goal Achievement: 10/06/17 Potential to Achieve Goals: Good    Frequency Min 3X/week   Barriers to discharge        Co-evaluation               AM-PAC PT "6 Clicks" Daily Activity  Outcome Measure Difficulty turning over in bed (including adjusting bedclothes, sheets and blankets)?: A Little Difficulty moving from lying on back to sitting on the side of the bed? : A Little Difficulty sitting down on and standing up from a chair with arms (e.g., wheelchair, bedside commode, etc,.)?: Unable Help needed moving to and from a bed to chair (including a wheelchair)?: A Little Help needed walking in hospital room?: A Little Help needed climbing 3-5 steps with a railing? : A Little 6 Click Score: 16    End of Session Equipment Utilized During Treatment: Gait  belt Activity Tolerance: Patient tolerated treatment well Patient left: in chair;with call bell/phone within reach;with family/visitor present Nurse Communication: Mobility status PT Visit Diagnosis: Unsteadiness on feet (R26.81);Muscle weakness (generalized) (M62.81)    Time: 4098-1191 PT Time Calculation (min) (ACUTE ONLY): 20 min   Charges:   PT Evaluation $PT Eval Low Complexity: 1 Low     PT G Codes:        Wayne Gordon, PT, DPT  Acute Rehabilitation Services  Pager: [phone]   Lehman Prom 09/22/2017, 1:50 PM

## 2017-09-22 NOTE — Care Management Note (Signed)
Case Management Note  Patient Details  Name: Wayne Gordon MRN: [id number] Date of Birth: March 23, 1934  Subjective/Objective:                    Action/Plan:  Patient agreeable to [facility] and HHPT. Patient has a walker at home but it is not a "tall rolling walker", patient states it was over 5 years ago since he got his walker. He would like a price on a tub bench and then decide if he will get it here or at a DME store.  Expected Discharge Date:                  Expected Discharge Plan:  Home w Home Health Services  In-House Referral:     Discharge planning Services  CM Consult  Post Acute Care Choice:  Home Health, Durable Medical Equipment Choice offered to:  Patient, Spouse  DME Arranged:  Tub bench, Walker rolling DME Agency:  [facility]  HH Arranged:  RN, PT [facility] Agency:  [facility]  Status of Service:  Completed, signed off  If discussed at Long Length of Stay Meetings, dates discussed:    Additional Comments:  Kingsley Plan, RN 09/22/2017, 2:08 PM

## 2017-09-22 NOTE — Evaluation (Signed)
Occupational Therapy Evaluation Patient Details Name: Wayne Gordon MRN: [id number] DOB: 1934-01-22 Today's Date: 09/22/2017    History of Present Illness Pt is an 84 y/o male admitted secondary to cholangitis. Pt is s/p laparascopic cholecystectomy. PMH inlcudes DM, a fib, CHF, CAD s/p CABG, CKD, ischemic cardiomyopathy, HTN, s/p defibrilator placement, and s/p MVR.    Clinical Impression   Pt reports he was independent with ADL and mobility PTA. Currently pt overall min guard assist with ADL and functional mobility. DOE 2/4; educated on energy conservation strategies and pursed lip breathing with SOB. Pt planning to d/c home with 24/7 supervision from family. Pt would benefit from continued skilled OT to address established goals.    Follow Up Recommendations  No OT follow up;Supervision/Assistance - 24 hour    Equipment Recommendations  3 in 1 bedside commode;Tub/shower bench    Recommendations for Other Services       Precautions / Restrictions Precautions Precautions: Fall Restrictions Weight Bearing Restrictions: No      Mobility Bed Mobility Overal bed mobility: Needs Assistance Bed Mobility: Supine to Sit     Supine to sit: Supervision     General bed mobility comments: for safety, no physical assist  Transfers Overall transfer level: Needs assistance Equipment used: Rolling walker (2 wheeled) Transfers: Sit to/from Stand Sit to Stand: Min guard         General transfer comment: Cues for hand placement, min guard for safety    Balance Overall balance assessment: Needs assistance Sitting-balance support: Feet supported;No upper extremity supported Sitting balance-Leahy Scale: Good     Standing balance support: No upper extremity supported;During functional activity Standing balance-Leahy Scale: Fair Standing balance comment: static standing                           ADL either performed or assessed with clinical judgement   ADL Overall  ADL's : Needs assistance/impaired Eating/Feeding: Set up;Sitting   Grooming: Set up;Supervision/safety;Sitting   Upper Body Bathing: Set up;Supervision/ safety;Sitting   Lower Body Bathing: Min guard;Sit to/from stand   Upper Body Dressing : Min guard;Standing Upper Body Dressing Details (indicate cue type and reason): to don shirt Lower Body Dressing: Min guard;Sit to/from stand Lower Body Dressing Details (indicate cue type and reason): Educated on compensatory strategies for LB ADL Toilet Transfer: Min guard;Ambulation;RW Toilet Transfer Details (indicate cue type and reason): Simulated by sit to stand from EOB with functional mobility in room       Tub/Shower Transfer Details (indicate cue type and reason): Discussed use of tub bench for transfers and sitting during bathing for energy conservation. Wife planning to supervise all bathing and tub transfers Functional mobility during ADLs: Min guard;Rolling walker General ADL Comments: DOE 2/4; discussed energy conservation stratgies and pursed lip breathing when experiencing SOB.     Vision         Perception     Praxis      Pertinent Vitals/Pain Pain Assessment: Faces Pain Score: 4  Faces Pain Scale: Hurts little more Pain Location: abdomen Pain Descriptors / Indicators: Sore Pain Intervention(s): Monitored during session;Repositioned     Hand Dominance Right   Extremity/Trunk Assessment Upper Extremity Assessment Upper Extremity Assessment: Generalized weakness   Lower Extremity Assessment Lower Extremity Assessment: Defer to PT evaluation   Cervical / Trunk Assessment Cervical / Trunk Assessment: Kyphotic   Communication Communication Communication: No difficulties   Cognition Arousal/Alertness: Awake/alert Behavior During Therapy: WFL for tasks assessed/performed Overall  Cognitive Status: Within Functional Limits for tasks assessed                                     General Comments   Pt's wife present during session. Educated about HHPT recommendations and pt agreeable.     Exercises     Shoulder Instructions      Home Living Family/patient expects to be discharged to:: Private residence Living Arrangements: Spouse/significant other;Children Available Help at Discharge: Family;Available 24 hours/day Type of Home: House Home Access: Stairs to enter [employer] of Steps: 3 Entrance Stairs-Rails: None Home Layout: One level     Bathroom Shower/Tub: Chief Strategy Officer: Handicapped height     Home Equipment: None          Prior Functioning/Environment Level of Independence: Independent                 OT Problem List: Decreased strength;Decreased activity tolerance;Impaired balance (sitting and/or standing);Decreased knowledge of use of DME or AE;Pain      OT Treatment/Interventions: Self-care/ADL training;Therapeutic exercise;Energy conservation;DME and/or AE instruction;Therapeutic activities;Patient/family education;Balance training    OT Goals(Current goals can be found in the care plan section) Acute Rehab OT Goals Patient Stated Goal: to go home today  OT Goal Formulation: With patient/family Time For Goal Achievement: 10/06/17 Potential to Achieve Goals: Good ADL Goals Pt Will Perform Tub/Shower Transfer: with supervision;ambulating;rolling walker(3in1 vs tub bench) Additional ADL Goal #1: Pt will independently verbally recall 3 energy conservation strategies and utilize during ADL.  OT Frequency: Min 2X/week   Barriers to D/C:            Co-evaluation              AM-PAC PT "6 Clicks" Daily Activity     Outcome Measure Help from another person eating meals?: None Help from another person taking care of personal grooming?: A Little Help from another person toileting, which includes using toliet, bedpan, or urinal?: A Little Help from another person bathing (including washing, rinsing, drying)?: A  Little Help from another person to put on and taking off regular upper body clothing?: A Little Help from another person to put on and taking off regular lower body clothing?: A Little 6 Click Score: 19   End of Session Equipment Utilized During Treatment: Rolling walker  Activity Tolerance: Patient tolerated treatment well Patient left: in chair;with call bell/phone within reach;with family/visitor present  OT Visit Diagnosis: Unsteadiness on feet (R26.81);Pain Pain - part of body: (abdomen)                Time: 9432-7614 OT Time Calculation (min): 13 min Charges:  OT General Charges $OT Visit: 1 Visit OT Evaluation $OT Eval Moderate Complexity: 1 Mod G-Codes:     Macyn Shropshire A. Brett Albino, M.S., OTR/L Pager: [phone]  Gaye Alken 09/22/2017, 4:21 PM

## 2017-09-28 ENCOUNTER — Emergency Department (HOSPITAL_COMMUNITY): Payer: Medicare HMO

## 2017-09-28 ENCOUNTER — Other Ambulatory Visit: Payer: Self-pay

## 2017-09-28 ENCOUNTER — Encounter (HOSPITAL_COMMUNITY): Payer: Self-pay | Admitting: Emergency Medicine

## 2017-09-28 ENCOUNTER — Inpatient Hospital Stay (HOSPITAL_COMMUNITY)
Admission: EM | Admit: 2017-09-28 | Discharge: 2017-10-01 | DRG: 872 | Disposition: A | Discharge status: Home Health | Payer: Medicare HMO | Attending: Internal Medicine | Admitting: Internal Medicine

## 2017-09-28 DIAGNOSIS — K8050 Calculus of bile duct without cholangitis or cholecystitis without obstruction: Secondary | ICD-10-CM

## 2017-09-28 DIAGNOSIS — R197 Diarrhea, unspecified: Secondary | ICD-10-CM

## 2017-09-28 DIAGNOSIS — N179 Acute kidney failure, unspecified: Secondary | ICD-10-CM | POA: Diagnosis present

## 2017-09-28 DIAGNOSIS — I252 Old myocardial infarction: Secondary | ICD-10-CM

## 2017-09-28 DIAGNOSIS — Z9889 Other specified postprocedural states: Secondary | ICD-10-CM

## 2017-09-28 DIAGNOSIS — Z888 Allergy status to other drugs, medicaments and biological substances status: Secondary | ICD-10-CM | POA: Diagnosis not present

## 2017-09-28 DIAGNOSIS — I5022 Chronic systolic (congestive) heart failure: Secondary | ICD-10-CM | POA: Diagnosis present

## 2017-09-28 DIAGNOSIS — N183 Chronic kidney disease, stage 3 unspecified: Secondary | ICD-10-CM | POA: Diagnosis present

## 2017-09-28 DIAGNOSIS — K8031 Calculus of bile duct with cholangitis, unspecified, with obstruction: Secondary | ICD-10-CM | POA: Diagnosis present

## 2017-09-28 DIAGNOSIS — I959 Hypotension, unspecified: Secondary | ICD-10-CM | POA: Diagnosis not present

## 2017-09-28 DIAGNOSIS — I255 Ischemic cardiomyopathy: Secondary | ICD-10-CM | POA: Diagnosis not present

## 2017-09-28 DIAGNOSIS — E785 Hyperlipidemia, unspecified: Secondary | ICD-10-CM | POA: Diagnosis present

## 2017-09-28 DIAGNOSIS — E1122 Type 2 diabetes mellitus with diabetic chronic kidney disease: Secondary | ICD-10-CM | POA: Diagnosis present

## 2017-09-28 DIAGNOSIS — R778 Other specified abnormalities of plasma proteins: Secondary | ICD-10-CM | POA: Diagnosis present

## 2017-09-28 DIAGNOSIS — K8309 Other cholangitis: Secondary | ICD-10-CM | POA: Diagnosis present

## 2017-09-28 DIAGNOSIS — I10 Essential (primary) hypertension: Secondary | ICD-10-CM | POA: Diagnosis present

## 2017-09-28 DIAGNOSIS — Z7901 Long term (current) use of anticoagulants: Secondary | ICD-10-CM

## 2017-09-28 DIAGNOSIS — R7989 Other specified abnormal findings of blood chemistry: Secondary | ICD-10-CM | POA: Diagnosis present

## 2017-09-28 DIAGNOSIS — E119 Type 2 diabetes mellitus without complications: Secondary | ICD-10-CM

## 2017-09-28 DIAGNOSIS — R112 Nausea with vomiting, unspecified: Secondary | ICD-10-CM

## 2017-09-28 DIAGNOSIS — K219 Gastro-esophageal reflux disease without esophagitis: Secondary | ICD-10-CM | POA: Diagnosis not present

## 2017-09-28 DIAGNOSIS — I4891 Unspecified atrial fibrillation: Secondary | ICD-10-CM | POA: Diagnosis present

## 2017-09-28 DIAGNOSIS — K8020 Calculus of gallbladder without cholecystitis without obstruction: Secondary | ICD-10-CM

## 2017-09-28 DIAGNOSIS — I482 Chronic atrial fibrillation: Secondary | ICD-10-CM | POA: Diagnosis present

## 2017-09-28 DIAGNOSIS — E860 Dehydration: Secondary | ICD-10-CM | POA: Diagnosis present

## 2017-09-28 DIAGNOSIS — I2510 Atherosclerotic heart disease of native coronary artery without angina pectoris: Secondary | ICD-10-CM | POA: Diagnosis present

## 2017-09-28 DIAGNOSIS — A414 Sepsis due to anaerobes: Secondary | ICD-10-CM | POA: Diagnosis present

## 2017-09-28 DIAGNOSIS — Z79899 Other long term (current) drug therapy: Secondary | ICD-10-CM

## 2017-09-28 DIAGNOSIS — R7401 Elevation of levels of liver transaminase levels: Secondary | ICD-10-CM

## 2017-09-28 DIAGNOSIS — I130 Hypertensive heart and chronic kidney disease with heart failure and stage 1 through stage 4 chronic kidney disease, or unspecified chronic kidney disease: Secondary | ICD-10-CM | POA: Diagnosis present

## 2017-09-28 DIAGNOSIS — E11649 Type 2 diabetes mellitus with hypoglycemia without coma: Secondary | ICD-10-CM | POA: Diagnosis present

## 2017-09-28 DIAGNOSIS — Z95810 Presence of automatic (implantable) cardiac defibrillator: Secondary | ICD-10-CM | POA: Diagnosis present

## 2017-09-28 DIAGNOSIS — Z794 Long term (current) use of insulin: Secondary | ICD-10-CM

## 2017-09-28 DIAGNOSIS — R740 Nonspecific elevation of levels of transaminase and lactic acid dehydrogenase [LDH]: Secondary | ICD-10-CM

## 2017-09-28 DIAGNOSIS — Z951 Presence of aortocoronary bypass graft: Secondary | ICD-10-CM | POA: Diagnosis not present

## 2017-09-28 DIAGNOSIS — K8071 Calculus of gallbladder and bile duct without cholecystitis with obstruction: Secondary | ICD-10-CM | POA: Diagnosis not present

## 2017-09-28 DIAGNOSIS — R748 Abnormal levels of other serum enzymes: Secondary | ICD-10-CM | POA: Diagnosis not present

## 2017-09-28 LAB — URINALYSIS, ROUTINE W REFLEX MICROSCOPIC
Bilirubin Urine: NEGATIVE
GLUCOSE, UA: NEGATIVE mg/dL
Hgb urine dipstick: NEGATIVE
KETONES UR: NEGATIVE mg/dL
Leukocytes, UA: NEGATIVE
Nitrite: NEGATIVE
PH: 5 (ref 5.0–8.0)
Protein, ur: NEGATIVE mg/dL
SPECIFIC GRAVITY, URINE: 1.014 (ref 1.005–1.030)

## 2017-09-28 LAB — COMPREHENSIVE METABOLIC PANEL
ALBUMIN: 2.9 g/dL — AB (ref 3.5–5.0)
ALT: 110 U/L — ABNORMAL HIGH (ref 17–63)
ANION GAP: 9 (ref 5–15)
AST: 55 U/L — AB (ref 15–41)
Alkaline Phosphatase: 211 U/L — ABNORMAL HIGH (ref 38–126)
BUN: 48 mg/dL — ABNORMAL HIGH (ref 6–20)
CO2: 25 mmol/L (ref 22–32)
Calcium: 8.8 mg/dL — ABNORMAL LOW (ref 8.9–10.3)
Chloride: 96 mmol/L — ABNORMAL LOW (ref 101–111)
Creatinine, Ser: 2.45 mg/dL — ABNORMAL HIGH (ref 0.61–1.24)
GFR calc Af Amer: 27 mL/min — ABNORMAL LOW (ref >60)
GFR calc non Af Amer: 23 mL/min — ABNORMAL LOW (ref >60)
Glucose, Bld: 148 mg/dL — ABNORMAL HIGH (ref 65–99)
POTASSIUM: 5 mmol/L (ref 3.5–5.1)
SODIUM: 130 mmol/L — AB (ref 135–145)
Total Bilirubin: 2.4 mg/dL — ABNORMAL HIGH (ref 0.3–1.2)
Total Protein: 6.4 g/dL — ABNORMAL LOW (ref 6.5–8.1)

## 2017-09-28 LAB — CBC WITH DIFFERENTIAL/PLATELET
Basophils Absolute: 0 10*3/uL (ref 0.0–0.1)
Basophils Relative: 0 %
Eosinophils Absolute: 0.1 10*3/uL (ref 0.0–0.7)
Eosinophils Relative: 1 %
HEMATOCRIT: 40.6 % (ref 39.0–52.0)
HEMOGLOBIN: 13.3 g/dL (ref 13.0–17.0)
LYMPHS PCT: 7 %
Lymphs Abs: 1.4 10*3/uL (ref 0.7–4.0)
MCH: 31.4 pg (ref 26.0–34.0)
MCHC: 32.8 g/dL (ref 30.0–36.0)
MCV: 95.8 fL (ref 78.0–100.0)
MONO ABS: 0.9 10*3/uL (ref 0.1–1.0)
MONOS PCT: 5 %
NEUTROS ABS: 17.2 10*3/uL — AB (ref 1.7–7.7)
Neutrophils Relative %: 87 %
Platelets: 223 10*3/uL (ref 150–400)
RBC: 4.24 MIL/uL (ref 4.22–5.81)
RDW: 14 % (ref 11.5–15.5)
WBC: 19.6 10*3/uL — ABNORMAL HIGH (ref 4.0–10.5)

## 2017-09-28 LAB — LACTIC ACID, PLASMA: LACTIC ACID, VENOUS: 1.3 mmol/L (ref 0.5–1.9)

## 2017-09-28 LAB — CBG MONITORING, ED: Glucose-Capillary: 102 mg/dL — ABNORMAL HIGH (ref 65–99)

## 2017-09-28 LAB — TROPONIN I: Troponin I: 0.03 ng/mL (ref <0.03)

## 2017-09-28 LAB — LIPASE, BLOOD: Lipase: 41 U/L (ref 11–51)

## 2017-09-28 MED ORDER — INSULIN ASPART 100 UNIT/ML ~~LOC~~ SOLN: 0.0000 [IU] | Freq: Every day | SUBCUTANEOUS | Status: DC

## 2017-09-28 MED ORDER — SODIUM CHLORIDE 0.9 % IV SOLN
INTRAVENOUS | Status: DC
  Administered 2017-09-28: 22:00:00 via INTRAVENOUS

## 2017-09-28 MED ORDER — PANTOPRAZOLE SODIUM 40 MG PO TBEC
40.0000 mg | DELAYED_RELEASE_TABLET | Freq: Every day | ORAL | Status: DC
  Administered 2017-09-29 – 2017-10-01 (×3): 40 mg via ORAL
  Filled 2017-09-28 (×3): qty 1

## 2017-09-28 MED ORDER — INSULIN ASPART 100 UNIT/ML ~~LOC~~ SOLN: 0.0000 [IU] | Freq: Three times a day (TID) | SUBCUTANEOUS | Status: DC

## 2017-09-28 MED ORDER — CARVEDILOL 12.5 MG PO TABS
12.5000 mg | ORAL_TABLET | Freq: Two times a day (BID) | ORAL | Status: DC
  Administered 2017-09-29 – 2017-10-01 (×5): 12.5 mg via ORAL
  Filled 2017-09-28 (×5): qty 1

## 2017-09-28 MED ORDER — ONDANSETRON HCL 4 MG/2ML IJ SOLN: 4.0000 mg | Freq: Four times a day (QID) | INTRAMUSCULAR | Status: DC | PRN

## 2017-09-28 MED ORDER — SODIUM CHLORIDE 0.9 % IV BOLUS (SEPSIS): 1000.0000 mL | Freq: Once | INTRAVENOUS | Status: DC

## 2017-09-28 MED ORDER — SODIUM CHLORIDE 0.9 % IV SOLN
INTRAVENOUS | Status: DC
  Administered 2017-09-28: via INTRAVENOUS

## 2017-09-28 MED ORDER — ONDANSETRON HCL 4 MG PO TABS: 4.0000 mg | ORAL_TABLET | Freq: Four times a day (QID) | ORAL | Status: DC | PRN

## 2017-09-28 MED ORDER — HYDRALAZINE HCL 25 MG PO TABS: 25.0000 mg | ORAL_TABLET | Freq: Three times a day (TID) | ORAL | Status: DC | PRN

## 2017-09-28 MED ORDER — MORPHINE SULFATE (PF) 2 MG/ML IV SOLN
1.0000 mg | INTRAVENOUS | Status: DC | PRN
  Administered 2017-09-29: 1 mg via INTRAVENOUS
  Filled 2017-09-28: qty 1

## 2017-09-28 MED ORDER — SODIUM CHLORIDE 0.9 % IV BOLUS (SEPSIS)
500.0000 mL | Freq: Once | INTRAVENOUS | Status: AC
  Administered 2017-09-28: 500 mL via INTRAVENOUS

## 2017-09-28 MED ORDER — METRONIDAZOLE IN NACL 5-0.79 MG/ML-% IV SOLN
500.0000 mg | Freq: Three times a day (TID) | INTRAVENOUS | Status: DC
  Administered 2017-09-29 – 2017-10-01 (×7): 500 mg via INTRAVENOUS
  Filled 2017-09-28 (×9): qty 100

## 2017-09-28 MED ORDER — DEXTROSE 5 % IV SOLN
2.0000 g | INTRAVENOUS | Status: DC
  Administered 2017-09-28 – 2017-09-30 (×3): 2 g via INTRAVENOUS
  Filled 2017-09-28 (×3): qty 2

## 2017-09-28 NOTE — ED Notes (Addendum)
Date and time results received: 09/28/17 8:44 PM Test: troponin Critical Value: 0.03 Name of Provider Notified: [email or web address]  Orders Received? Or Actions Taken?: md notified

## 2017-09-28 NOTE — Anesthesia Postprocedure Evaluation (Signed)
Anesthesia Post Note  Patient: Nichoals Colasuonno Bretado  Procedure(s) Performed: LAPAROSCOPIC SUBTOTAL CHOLECYSTECTOMY (N/A Abdomen)     Patient location during evaluation: PACU Anesthesia Type: General Level of consciousness: awake and alert Pain management: pain level controlled Vital Signs Assessment: post-procedure vital signs reviewed and stable Respiratory status: spontaneous breathing, nonlabored ventilation, respiratory function stable and patient connected to nasal cannula oxygen Cardiovascular status: blood pressure returned to baseline and stable Postop Assessment: no apparent nausea or vomiting Anesthetic complications: no    Last Vitals:  Vitals:   09/22/17 1000 09/22/17 1443  BP: 130/73 (!) 146/75  Pulse: (!) 55 (!) 51  Resp:    Temp: 36.5 C 36.8 C  SpO2: 99% 99%    Last Pain:  Vitals:   09/22/17 1443  TempSrc: Oral  PainSc:                  Demesha Boorman S

## 2017-09-28 NOTE — ED Triage Notes (Signed)
Ems brings pt in for nausea, vomiting and diarrhea x 2 days. Pt was discharged on 09/22/17 from [facility] for partial gallbladder removal.

## 2017-09-28 NOTE — ED Provider Notes (Signed)
[facility] EMERGENCY DEPARTMENT Provider Note   CSN: [id number] Arrival date & time: 09/28/17  1745     History   Chief Complaint Chief Complaint  Patient presents with  . post surgery  . Emesis  . Diarrhea  . Hypotension    HPI Wayne Gordon is a 84 y.o. male.   Emesis   Associated symptoms include diarrhea.  Diarrhea   Associated symptoms include vomiting.  Pt was seen at 1755. Per pt, c/o gradual onset and persistence of multiple intermittent episodes of N/V/D that began yesterday. Pt states he has had "chills" for the past 2 days. Has been associated with decreased urine output and "low blood pressure" for the past 2 days. States his Home Health RN told him his "BP was low" and "not to take my medicines" for the past 2 days.  Describes the stools as "watery."  Pt is s/p lap chole last week, denies any increase in abd pain.  Denies CP/SOB, no back pain, no objective fevers, no black or blood in stools or emesis.     Past Medical History:  Diagnosis Date  . Allergy to ACE inhibitors    Angioedema many years ago; patient has tolerated Losartan (ARB) in the past - it was held during [facility] for GI bleed and worsening renal function >> resume Losartan 25 mg QD 06/2015  . Arthritis   . Atrial fibrillation (HCC)    PAF, CHADs2Vasc = 5  . Carotid artery disease (HCC)    60-79% LICA  . CHF (congestive heart failure) (HCC)   . Coronary artery disease   . Diabetes mellitus   . GERD (gastroesophageal reflux disease)   . Hyperlipidemia   . Hypertension   . ICD (implantable cardiac defibrillator) in place 03/28/2012   [place]ron[place], Dr. Ladona Ridgel 03/28/12  . Ischemic cardiomyopathy    S/P CABG x 3; EF 25%  . Mitral regurgitation    S/P mitral valve repair 2013  . Myocardial infarction (HCC)   . Renal insufficiency     Patient Active Problem List   Diagnosis Date Noted  . Bacteremia due to Klebsiella pneumoniae 09/22/2017  . Sepsis due to Klebsiella pneumoniae (HCC)  09/22/2017  . Cholangitis 09/17/2017  . Leukocytosis 09/17/2017  . Cholelithiasis 09/17/2017  . Elevated liver enzymes 09/17/2017  . Acute renal failure superimposed on stage 3 chronic kidney disease (HCC) 09/17/2017  . Hypotension 09/17/2017  . Lactic acidosis 09/17/2017  . Elevated troponin 09/17/2017  . Nausea without vomiting 01/10/2017  . Gastrointestinal hemorrhage associated with intestinal diverticulosis 06/23/2016  . Carotid artery disease (HCC)   . Syncope and collapse 07/11/2015  . Ventricular arrhythmia 07/10/2015  . Complete heart block (HCC)   . ICD (implantable cardioverter-defibrillator) discharge   . Atrial fibrillation (HCC) 06/04/2015  . Diabetes (HCC) 06/02/2015  . 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/02/2015  . GERD (gastroesophageal reflux disease) 02/18/2015  . Abdominal pain, epigastric 02/18/2015  . Pleural effusion 09/01/2014  . Rectal bleeding 03/25/2014  . Acute blood loss anemia 03/25/2014  . Body mass index (BMI) of 28.0-28.9 in adult 09/30/2013  . Diabetes mellitus, type 2 (HCC) 08/09/2013  . PVC's (premature ventricular contractions) 07/04/2013  . HLD (hyperlipidemia) 01/03/2013  . Automatic implantable cardioverter-defibrillator in situ 03/30/2012  . S/P MVR (mitral valve repair) 01/16/2012  . HTN (hypertension) 11/28/2011  . Chronic systolic heart failure (HCC) 10/31/2011  . Ischemic cardiomyopathy 10/31/2011  . CKD (chronic kidney disease) stage 3, GFR 30-59 ml/min (HCC) 10/31/2011  . Current use of long term  anticoagulation 10/31/2011  . Long term (current) use of anticoagulants 10/28/2011  . S/P mitral valve repair 10/17/2011  . S/P CABG x 3 10/17/2011  . Mitral regurgitation 10/12/2011  . NSTEMI (non-ST elevated myocardial infarction) (HCC) 10/11/2011    Past Surgical History:  Procedure Laterality Date  . CHOLECYSTECTOMY N/A 09/20/2017   Procedure: LAPAROSCOPIC SUBTOTAL CHOLECYSTECTOMY;  Surgeon: Axel Filler, MD;  Location: [facility] OR;   Service: General;  Laterality: N/A;  . COLONOSCOPY N/A 04/24/2014   RMR Pancolonic diverticulosis  . CORONARY ARTERY BYPASS GRAFT  10/17/2011   Procedure: CORONARY ARTERY BYPASS GRAFTING (CABG);  Surgeon: Purcell Nails, MD;  Location: [facility] OR;  Service: Open Heart Surgery;  Laterality: N/A;  . ESOPHAGOGASTRODUODENOSCOPY N/A 04/24/2014   RMR Subtle nodularity the gastric mucosa of uncertain significance-status post biopsy. Hiatal hernia. chronic inflammation, no H.pylori  . ESOPHAGOGASTRODUODENOSCOPY N/A 06/24/2016   Dr. Jena Gauss: normal esophagus, small hiatal hernia, normal duodenum  . ICD  03/28/2012  . IMPLANTABLE CARDIOVERTER DEFIBRILLATOR IMPLANT N/A 03/28/2012   Procedure: IMPLANTABLE CARDIOVERTER DEFIBRILLATOR IMPLANT;  Surgeon: Marinus Maw, MD;  Location: [facility] CATH LAB;  Service: Cardiovascular;  Laterality: N/A;  . KNEE ARTHROSCOPY  ~ 2008   right  . LEFT AND RIGHT HEART CATHETERIZATION WITH CORONARY ANGIOGRAM N/A 10/12/2011   Procedure: LEFT AND RIGHT HEART CATHETERIZATION WITH CORONARY ANGIOGRAM;  Surgeon: Iran Ouch, MD;  Location: [facility] CATH LAB;  Service: Cardiovascular;  Laterality: N/A;  . MITRAL VALVE REPAIR  10/17/2011   Procedure: MITRAL VALVE REPAIR (MVR);  Surgeon: Purcell Nails, MD;  Location: [facility] OR;  Service: Open Heart Surgery;  Laterality: N/A;  . NASAL SINUS SURGERY  1990's   right       Home Medications    Prior to Admission medications   Medication Sig Start Date End Date Taking? Authorizing Provider  apixaban (ELIQUIS) 2.5 MG TABS tablet Take 1 tablet (2.5 mg total) by mouth 2 (two) times daily. 09/22/17   Calvert Cantor, MD  CALCIUM-MAGNESIUM-ZINC PO Take 2 tablets by mouth daily.    [provider]  carvedilol (COREG) 6.25 MG tablet Take 1 tablet (6.25 mg total) by mouth 2 (two) times daily. Patient taking differently: Take 12.5 mg by mouth 2 (two) times daily.  12/30/16   Marinus Maw, MD  cephALEXin (KEFLEX) 500 MG capsule Take 1 capsule (500 mg  total) by mouth every 8 (eight) hours. 09/22/17   Calvert Cantor, MD  furosemide (LASIX) 20 MG tablet Take 1 tablet (20 mg total) by mouth daily. 09/22/17   Calvert Cantor, MD  glipiZIDE (GLUCOTROL XL) 10 MG 24 hr tablet Take 10 mg by mouth 2 times daily at 12 noon and 4 pm. Pt takes it in the AM and PM    [provider]  hydrOXYzine (ATARAX/VISTARIL) 25 MG tablet Take 1 tablet by mouth 3 (three) times daily. 09/09/17   [provider]  Insulin Glargine (TOUJEO SOLOSTAR) 300 UNIT/ML SOPN Inject 12-15 Units as directed daily.  04/27/15   [provider]  losartan (COZAAR) 25 MG tablet Take 1 tablet (25 mg total) by mouth daily. 07/11/15   Tereso Newcomer T, PA-C  Multiple Vitamins-Minerals (MULTIVITAMINS THER. W/MINERALS) TABS Take 1 tablet by mouth daily.    [provider]  pantoprazole (PROTONIX) 40 MG tablet Take 1 tablet by mouth daily. 01/03/17   [provider]  rosuvastatin (CRESTOR) 20 MG tablet Take 20 mg by mouth every evening.     [provider]  testosterone cypionate (  DEPO-TESTOSTERONE) 200 MG/ML injection Inject 0.5 mg into the muscle every 21 ( twenty-one) days. INJECT 0.6ML- 0.8ML EVERY 2 WEEKS 08/13/14   [provider]    Family History Family History  Problem Relation Age of Onset  . Heart disease Mother   . Diabetes Father   . Cardiomyopathy Father   . Colon cancer Neg Hx     Social History Social History   Tobacco Use  . Smoking status: Never Smoker  . Smokeless tobacco: Never Used  Substance Use Topics  . Alcohol use: No    Alcohol/week: 0.0 oz  . Drug use: No     Allergies   Ace inhibitors and Penicillins   Review of Systems Review of Systems  Gastrointestinal: Positive for diarrhea and vomiting.  ROS: Statement: All systems negative except as marked or noted in the HPI; Constitutional: Negative for fever and +chills. ; ; Eyes: Negative for eye pain, redness and discharge. ; ; ENMT: Negative for ear  pain, hoarseness, nasal congestion, sinus pressure and sore throat. ; ; Cardiovascular: Negative for chest pain, palpitations, diaphoresis, dyspnea and peripheral edema. ; ; Respiratory: Negative for cough, wheezing and stridor. ; ; Gastrointestinal: +N/V/D. Negative for abdominal pain, blood in stool, hematemesis, jaundice and rectal bleeding. . ; ; Genitourinary: +decreased urine output. Negative for dysuria, flank pain and hematuria. ; ; Musculoskeletal: Negative for back pain and neck pain. Negative for swelling and trauma.; ; Skin: Negative for pruritus, rash, abrasions, blisters, bruising and skin lesion.; ; Neuro: Negative for headache, lightheadedness and neck stiffness. Negative for weakness, altered level of consciousness, altered mental status, extremity weakness, paresthesias, involuntary movement, seizure and syncope.       Physical Exam Updated Vital Signs BP 100/66 (BP Location: Right Arm)   Pulse (!) 55   Temp 98.7 F (37.1 C) (Oral)   Resp 18   Ht 6\' 5"  (1.956 m)   Wt 107.5 kg (237 lb)   SpO2 98%   BMI 28.10 kg/m    Patient Vitals for the past 24 hrs:  BP Temp Temp src Pulse Resp SpO2 Height Weight  09/28/17 2030 (!) 114/58 - - (!) 54 (!) 21 99 % - -  09/28/17 2015 - - - (!) 55 19 99 % - -  09/28/17 2000 111/63 - - - (!) 21 - - -  09/28/17 1938 - - - - - 98 % - -  09/28/17 1900 (!) 109/92 - - (!) 55 (!) 23 95 % - -  09/28/17 1829 - 99.5 F (37.5 C) Rectal - - - - -  09/28/17 1750 - - - - - - 6\' 5"  (1.956 m) 107.5 kg (237 lb)  09/28/17 1748 100/66 98.7 F (37.1 C) Oral (!) 55 18 98 % - -     Physical Exam 1800: Physical examination:  Nursing notes reviewed; Vital signs and O2 SAT reviewed;  Constitutional: Well developed, Well nourished, In no acute distress; Head:  Normocephalic, atraumatic; Eyes: EOMI, PERRL, No scleral icterus; ENMT: Mouth and pharynx normal, Mucous membranes dry; Neck: Supple, Full range of motion, No lymphadenopathy; Cardiovascular: Irregular  rate and rhythm, No gallop; Respiratory: Breath sounds clear & equal bilaterally, No wheezes.  Speaking full sentences with ease, Normal respiratory effort/excursion; Chest: Nontender, Movement normal; Abdomen: Soft, +mild diffuse tenderness to palp. +small surgical wounds to abd with localized ecchymosis, no erythema, no drainage. +JP drain RUQ draining yellow fluid. No purulence, no bleeding. Nondistended, Normal bowel sounds; Genitourinary: No CVA tenderness; Extremities: Pulses normal,  No tenderness, No edema, No calf edema or asymmetry.; Neuro: AA&Ox3, Major CN grossly intact.  Speech clear. No gross focal motor or sensory deficits in extremities.; Skin: Color normal, Warm, Dry.    ED Treatments / Results  Labs (all labs ordered are listed, but only abnormal results are displayed)   EKG  EKG Interpretation  Date/Time:  Thursday September 28 2017 18:22:52 EST Ventricular Rate:  55 PR Interval:    QRS Duration: 220 QT Interval:  540 QTC Calculation: 517 R Axis:   -78 Text Interpretation:  Afib/flutter and ventricular-paced rhythm No further analysis attempted due to paced rhythm When compared with ECG of 09/17/2017 No significant change was found Confirmed by Samuel Jester [phone]) on 09/28/2017 6:56:02 PM       Radiology   Procedures Procedures (including critical care time)  Medications Ordered in ED Medications - No data to display   Initial Impression / Assessment and Plan / ED Course  I have reviewed the triage vital signs and the nursing notes.  Pertinent labs & imaging results that were available during my care of the patient were reviewed by me and considered in my medical decision making (see chart for details).  MDM Reviewed: previous chart, nursing note and vitals Reviewed previous: labs and ECG Interpretation: labs, ECG, x-ray and CT scan    Results for orders placed or performed during the hospital encounter of 09/28/17  Culture, blood (routine x 2)    Result Value Ref Range   Specimen Description RIGHT ANTECUBITAL    Special Requests      BOTTLES DRAWN AEROBIC AND ANAEROBIC Blood Culture adequate volume   Culture PENDING    Report Status PENDING   Culture, blood (routine x 2)  Result Value Ref Range   Specimen Description BLOOD RIGHT HAND    Special Requests      BOTTLES DRAWN AEROBIC ONLY Blood Culture adequate volume   Culture PENDING    Report Status PENDING   Comprehensive metabolic panel  Result Value Ref Range   Sodium 130 (L) 135 - 145 mmol/L   Potassium 5.0 3.5 - 5.1 mmol/L   Chloride 96 (L) 101 - 111 mmol/L   CO2 25 22 - 32 mmol/L   Glucose, Bld 148 (H) 65 - 99 mg/dL   BUN 48 (H) 6 - 20 mg/dL   Creatinine, Ser 5.79 (H) 0.61 - 1.24 mg/dL   Calcium 8.8 (L) 8.9 - 10.3 mg/dL   Total Protein 6.4 (L) 6.5 - 8.1 g/dL   Albumin 2.9 (L) 3.5 - 5.0 g/dL   AST 55 (H) 15 - 41 U/L   ALT 110 (H) 17 - 63 U/L   Alkaline Phosphatase 211 (H) 38 - 126 U/L   Total Bilirubin 2.4 (H) 0.3 - 1.2 mg/dL   GFR calc non Af Amer 23 (L) >60 mL/min   GFR calc Af Amer 27 (L) >60 mL/min   Anion gap 9 5 - 15  Lipase, blood  Result Value Ref Range   Lipase 41 11 - 51 U/L  Troponin I  Result Value Ref Range   Troponin I 0.03 (HH) <0.03 ng/mL  Lactic acid, plasma  Result Value Ref Range   Lactic Acid, Venous 1.3 0.5 - 1.9 mmol/L  CBC with Differential  Result Value Ref Range   WBC 19.6 (H) 4.0 - 10.5 K/uL   RBC 4.24 4.22 - 5.81 MIL/uL   Hemoglobin 13.3 13.0 - 17.0 g/dL   HCT 03.8 33.3 - 83.2 %   MCV  95.8 78.0 - 100.0 fL   MCH 31.4 26.0 - 34.0 pg   MCHC 32.8 30.0 - 36.0 g/dL   RDW 16.1 09.6 - 04.5 %   Platelets 223 150 - 400 K/uL   Neutrophils Relative % 87 %   Neutro Abs 17.2 (H) 1.7 - 7.7 K/uL   Lymphocytes Relative 7 %   Lymphs Abs 1.4 0.7 - 4.0 K/uL   Monocytes Relative 5 %   Monocytes Absolute 0.9 0.1 - 1.0 K/uL   Eosinophils Relative 1 %   Eosinophils Absolute 0.1 0.0 - 0.7 K/uL   Basophils Relative 0 %   Basophils Absolute  0.0 0.0 - 0.1 K/uL  Urinalysis, Routine w reflex microscopic  Result Value Ref Range   Color, Urine AMBER (A) YELLOW   APPearance CLEAR CLEAR   Specific Gravity, Urine 1.014 1.005 - 1.030   pH 5.0 5.0 - 8.0   Glucose, UA NEGATIVE NEGATIVE mg/dL   Hgb urine dipstick NEGATIVE NEGATIVE   Bilirubin Urine NEGATIVE NEGATIVE   Ketones, ur NEGATIVE NEGATIVE mg/dL   Protein, ur NEGATIVE NEGATIVE mg/dL   Nitrite NEGATIVE NEGATIVE   Leukocytes, UA NEGATIVE NEGATIVE   Ct Abdomen Pelvis Wo Contrast Result Date: 09/28/2017 CLINICAL DATA:  Patient with nausea, vomiting and diarrhea. Prior partial gallbladder removal 09/22/2017. EXAM: CT ABDOMEN AND PELVIS WITHOUT CONTRAST TECHNIQUE: Multidetector CT imaging of the abdomen and pelvis was performed following the standard protocol without IV contrast. COMPARISON:  CT abdomen pelvis 09/17/2017. FINDINGS: Lower chest: Subpleural consolidation within the right lower lobe. Small right pleural effusion. Trace left pleural effusion. Hepatobiliary: Liver is normal in size and contour. Postsurgical changes compatible with partial colectomy. Surgical drain terminates along the hepatic margin. No fluid collection is identified. Mild common bile duct dilatation measuring 11 mm. There is a 9 mm stone within the distal common bile duct (image 29; series 2). Pancreas: Unremarkable Spleen: Unremarkable Adrenals/Urinary Tract: Adrenal glands are normal. Kidneys are symmetric in size. No hydronephrosis. Small fluid attenuation structure inferior pole right kidney. Urinary bladder is unremarkable. Stomach/Bowel: Extensive descending and sigmoid colonic diverticulosis. No CT evidence for acute diverticulitis. The appendix is normal. No evidence for bowel obstruction. No significant free fluid or free intraperitoneal air. Normal morphology of the stomach. Vascular/Lymphatic: Normal caliber abdominal aorta. Peripheral calcified atherosclerotic plaque. No retroperitoneal lymphadenopathy.  Reproductive: Prostate is mildly enlarged. Other: Small bilateral fat containing inguinal hernias. Musculoskeletal: No aggressive or acute appearing osseous lesions. Lumbar spine degenerative changes. IMPRESSION: 1. There is a 9 mm stone within the distal common bile duct resulting in mild common bile duct dilatation. 2. Postsurgical changes compatible with cholecystectomy. Drain catheter courses along the hepatic margin. No fluid collection is identified. 3. Consolidation within the right lower lobe and adjacent small right pleural effusion. Findings may represent atelectasis or infection. Electronically Signed   By: Annia Belt M.D.   On: 09/28/2017 19:15    Dg Chest 2 View Result Date: 09/28/2017 CLINICAL DATA:  Nausea, vomiting and diarrhea. EXAM: CHEST  2 VIEW COMPARISON:  Chest radiograph 09/17/2017 FINDINGS: Single lead AICD device overlies the left hemithorax. Stable cardiomegaly status post median sternotomy. Elevation right hemidiaphragm. Heterogeneous opacities right lung base. Small right pleural effusion. No pneumothorax. IMPRESSION: Heterogeneous opacities right lung base may represent atelectasis or infection. Small right pleural effusion. Electronically Signed   By: Annia Belt M.D.   On: 09/28/2017 19:24    Results for WOODARD, PERRELL (MRN [id number]) as of 09/28/2017 21:53  Ref. Range 09/18/2017 05:02 09/20/2017  07:45 09/21/2017 06:39 09/22/2017 05:34 09/28/2017 19:14  BUN Latest Ref Range: 6 - 20 mg/dL 50 (H) 35 (H) 42 (H) 44 (H) 48 (H)  Creatinine Latest Ref Range: 0.61 - 1.24 mg/dL 9.60 (H) 4.54 (H) 0.98 (H) 1.81 (H) 2.45 (H)    Results for LANI, MENDIOLA (MRN [id number]) as of 09/28/2017 21:53  Ref. Range 09/18/2017 05:02 09/20/2017 07:45 09/21/2017 06:39 09/22/2017 05:34 09/28/2017 19:14  AST Latest Ref Range: 15 - 41 U/L 161 (H) 48 (H) 42 (H) 28 55 (H)  ALT Latest Ref Range: 17 - 63 U/L 341 (H) 177 (H) 138 (H) 98 (H) 110 (H)  Total Bilirubin Latest Ref Range: 0.3 - 1.2 mg/dL 3.6 (H) 2.2  (H) 2.1 (H) 1.5 (H) 2.4 (H)      2120:  Pt remains afebrile while in the ED. Judicious IVF bolus given for soft BP with improvement. BUN/Cr, LFT's and WBC all trending upward since hospital discharge last week. Troponin chronically elevated. Dx and testing d/w pt and family.  Questions answered.  Verb understanding, agreeable to transfer/admit to [facility].   T/C to [facility] Dr. Janee Morn, case discussed, including:  HPI, pertinent PM/SHx, VS/PE, dx testing, ED course and treatment:  Agreeable to consult if needed, pt will need [facility] transfer/admit and GI MD consult.   2135:  T/C to [facility] GI Dr. Elnoria Howard, case discussed, including:  HPI, pertinent PM/SHx, VS/PE, dx testing, ED course and treatment:  Agreeable to consult, requests to admit to [facility] service at [facility].   2150:  T/C to [facility] Dr. Sherryll Burger, case discussed, including:  HPI, pertinent PM/SHx, VS/PE, dx testing, ED course and treatment:  Agreeable to transfer/admit to [facility].       Final Clinical Impressions(s) / ED Diagnoses   Final diagnoses:  None    ED Discharge Orders    None        Samuel Jester, DO 09/30/17 1126

## 2017-09-28 NOTE — H&P (Addendum)
History and Physical    Wayne Gordon KGS:[id number] DOB: [birth year] DOA: 09/28/2017  PCP: Samuel Jester, DO   Patient coming from: Home  Chief Complaint: N/V/D  HPI: Wayne Gordon is a 84 y.o. male with medical history significant for CAD status post CABG, mitral valve repair, chronic systolic CHF with EF 35-40%, AICD, hypertension, type 2 diabetes, atrial fibrillation on Eliquis, and recent subtotal cholecystectomy with drain placement and complications of sepsis due to cholangitis and Klebsiella bacteremia.  He was recently discharged from [facility] on 09/22/2017.  He presents today to the emergency department with some nausea and an episode of vomiting yesterday along with some diarrhea noted today.  He denies any significant abdominal pain, fever, or chills but was quite concerned given his recent operation.  He has also had some chills for the last 2 days and some decreased urine output as well as some low blood pressure readings.  He seems to feel that he is dehydrated as he has not had adequate oral intake in the past 2 days on account of the symptoms.  He denies any chest pain, palpitations, diaphoresis, or shortness of breath.  No blood noted in stools or emesis.   ED Course: Patient noted to be afebrile with stable vital signs in the ED.  He is noted to have leukocytosis of 19,600 on lab work along with BUN of 48 and creatinine of 2.45.  Troponin is 0.03.  AST is 55, ALT is 110, alkaline phosphatase is 211, total bilirubin is 2.4, and lactic acid is 1.3.  CT of the abdomen demonstrates a 9 mm bile duct stone with mild obstruction. No fluid collection noted around the drain. ED physician Dr. Clarene Duke has spoken with GI Dr. Elnoria Howard as well as general surgery Dr. Janee Morn who both agree to consultation and state that transfer to [facility] is appropriate for further evaluation and workup under the Hospitalist service.  Review of Systems: As per HPI otherwise 10 point review of systems negative.     Past Medical History:  Diagnosis Date  . Allergy to ACE inhibitors    Angioedema many years ago; patient has tolerated Losartan (ARB) in the past - it was held during [facility] for GI bleed and worsening renal function >> resume Losartan 25 mg QD 06/2015  . Arthritis   . Atrial fibrillation (HCC)    PAF, CHADs2Vasc = 5  . Carotid artery disease (HCC)    60-79% LICA  . CHF (congestive heart failure) (HCC)   . Coronary artery disease   . Diabetes mellitus   . GERD (gastroesophageal reflux disease)   . Hyperlipidemia   . Hypertension   . ICD (implantable cardiac defibrillator) in place 03/28/2012   [place]ron[place], Dr. Ladona Ridgel 03/28/12  . Ischemic cardiomyopathy    S/P CABG x 3; EF 25%  . Mitral regurgitation    S/P mitral valve repair 2013  . Myocardial infarction (HCC)   . Renal insufficiency     Past Surgical History:  Procedure Laterality Date  . CHOLECYSTECTOMY N/A 09/20/2017   Procedure: LAPAROSCOPIC SUBTOTAL CHOLECYSTECTOMY;  Surgeon: Axel Filler, MD;  Location: [facility] OR;  Service: General;  Laterality: N/A;  . COLONOSCOPY N/A 04/24/2014   RMR Pancolonic diverticulosis  . CORONARY ARTERY BYPASS GRAFT  10/17/2011   Procedure: CORONARY ARTERY BYPASS GRAFTING (CABG);  Surgeon: Purcell Nails, MD;  Location: [facility] OR;  Service: Open Heart Surgery;  Laterality: N/A;  . ESOPHAGOGASTRODUODENOSCOPY N/A 04/24/2014   RMR Subtle nodularity the gastric mucosa of  uncertain significance-status post biopsy. Hiatal hernia. chronic inflammation, no H.pylori  . ESOPHAGOGASTRODUODENOSCOPY N/A 06/24/2016   Dr. Jena Gauss: normal esophagus, small hiatal hernia, normal duodenum  . ICD  03/28/2012  . IMPLANTABLE CARDIOVERTER DEFIBRILLATOR IMPLANT N/A 03/28/2012   Procedure: IMPLANTABLE CARDIOVERTER DEFIBRILLATOR IMPLANT;  Surgeon: Marinus Maw, MD;  Location: [facility] CATH LAB;  Service: Cardiovascular;  Laterality: N/A;  . KNEE ARTHROSCOPY  ~ 2008   right  . LEFT AND RIGHT HEART CATHETERIZATION WITH CORONARY  ANGIOGRAM N/A 10/12/2011   Procedure: LEFT AND RIGHT HEART CATHETERIZATION WITH CORONARY ANGIOGRAM;  Surgeon: Iran Ouch, MD;  Location: [facility] CATH LAB;  Service: Cardiovascular;  Laterality: N/A;  . MITRAL VALVE REPAIR  10/17/2011   Procedure: MITRAL VALVE REPAIR (MVR);  Surgeon: Purcell Nails, MD;  Location: [facility] OR;  Service: Open Heart Surgery;  Laterality: N/A;  . NASAL SINUS SURGERY  1990's   right     reports that  has never smoked. he has never used smokeless tobacco. He reports that he does not drink alcohol or use drugs.    Family History  Problem Relation Age of Onset  . Heart disease Mother   . Diabetes Father   . Cardiomyopathy Father   . Colon cancer Neg Hx     Prior to Admission medications   Medication Sig Start Date End Date Taking? Authorizing Provider  apixaban (ELIQUIS) 2.5 MG TABS tablet Take 1 tablet (2.5 mg total) by mouth 2 (two) times daily. 09/22/17  Yes Calvert Cantor, MD  CALCIUM-MAGNESIUM-ZINC PO Take 2 tablets by mouth daily.   Yes [provider]  carvedilol (COREG) 6.25 MG tablet Take 1 tablet (6.25 mg total) by mouth 2 (two) times daily. Patient taking differently: Take 12.5 mg by mouth 2 (two) times daily.  12/30/16  Yes Marinus Maw, MD  cephALEXin (KEFLEX) 500 MG capsule Take 1 capsule (500 mg total) by mouth every 8 (eight) hours. 09/22/17  Yes Calvert Cantor, MD  furosemide (LASIX) 20 MG tablet Take 1 tablet (20 mg total) by mouth daily. 09/22/17  Yes Calvert Cantor, MD  glipiZIDE (GLUCOTROL XL) 10 MG 24 hr tablet Take 10 mg by mouth 2 times daily at 12 noon and 4 pm. Pt takes it in the AM and PM   Yes [provider]  hydrALAZINE (APRESOLINE) 25 MG tablet Take 25 mg by mouth 3 (three) times daily as needed (for bp levels).   Yes [provider]  Insulin Glargine (TOUJEO SOLOSTAR) 300 UNIT/ML SOPN Inject 12-15 Units as directed daily.  04/27/15  Yes [provider]  losartan (COZAAR) 25 MG tablet Take 1 tablet (25 mg  total) by mouth daily. 07/11/15  Yes Weaver, Scott T, PA-C  Multiple Vitamins-Minerals (MULTIVITAMINS THER. W/MINERALS) TABS Take 1 tablet by mouth daily.   Yes [provider]  pantoprazole (PROTONIX) 40 MG tablet Take 1 tablet by mouth daily. 01/03/17  Yes [provider]  rosuvastatin (CRESTOR) 20 MG tablet Take 20 mg by mouth every evening.    Yes [provider]  testosterone cypionate (DEPO-TESTOSTERONE) 200 MG/ML injection Inject 0.5 mg into the muscle every 21 ( twenty-one) days.  08/13/14  Yes [provider]    Physical Exam: Vitals:   09/28/17 2100 09/28/17 2130 09/28/17 2200 09/28/17 2230  BP: (!) 114/57 (!) 110/52 (!) 105/58 113/66  Pulse: (!) 55 (!) 53 (!) 55 (!) 43  Resp: 20 20 (!) 21 17  Temp:      TempSrc:  SpO2: 98% 97% 97% 94%  Weight:      Height:        Constitutional: NAD, calm, comfortable Vitals:   09/28/17 2100 09/28/17 2130 09/28/17 2200 09/28/17 2230  BP: (!) 114/57 (!) 110/52 (!) 105/58 113/66  Pulse: (!) 55 (!) 53 (!) 55 (!) 43  Resp: 20 20 (!) 21 17  Temp:      TempSrc:      SpO2: 98% 97% 97% 94%  Weight:      Height:       Eyes: lids and conjunctivae normal ENMT: Mucous membranes are moist.  Neck: normal, supple Respiratory: clear to auscultation bilaterally. Normal respiratory effort. No accessory muscle use.  Cardiovascular: Regular rate and rhythm, no murmurs. No extremity edema. Abdomen: no tenderness, no distention. Bowel sounds positive. Incisions c/d/i; drain with minimal output; some bilious; no Guarding, rigidity, or rebound Musculoskeletal:  No joint deformity upper and lower extremities.   Skin: no rashes, lesions, ulcers.  Psychiatric: Normal judgment and insight. Alert and oriented x 3. Normal mood.   Labs on Admission: I have personally reviewed following labs and imaging studies  CBC: Recent Labs  Lab 09/22/17 0534 09/28/17 1914  WBC 11.0* 19.6*  NEUTROABS 7.8* 17.2*  HGB 14.5 13.3    HCT 43.1 40.6  MCV 94.1 95.8  PLT 159 223   Basic Metabolic Panel: Recent Labs  Lab 09/22/17 0534 09/28/17 1914  NA 136 130*  K 4.9 5.0  CL 107 96*  CO2 18* 25  GLUCOSE 142* 148*  BUN 44* 48*  CREATININE 1.81* 2.45*  CALCIUM 8.2* 8.8*   GFR: Estimated Creatinine Clearance: 32.3 mL/min (A) (by C-G formula based on SCr of 2.45 mg/dL (H)). Liver Function Tests: Recent Labs  Lab 09/22/17 0534 09/28/17 1914  AST 28 55*  ALT 98* 110*  ALKPHOS 119 211*  BILITOT 1.5* 2.4*  PROT 5.4* 6.4*  ALBUMIN 2.6* 2.9*   Recent Labs  Lab 09/28/17 1914  LIPASE 41   No results for input(s): AMMONIA in the last 168 hours. Coagulation Profile: No results for input(s): INR, PROTIME in the last 168 hours. Cardiac Enzymes: Recent Labs  Lab 09/28/17 1914  TROPONINI 0.03*   BNP (last 3 results) No results for input(s): PROBNP in the last 8760 hours. HbA1C: No results for input(s): HGBA1C in the last 72 hours. CBG: Recent Labs  Lab 09/22/17 0759 09/22/17 1144  GLUCAP 137* 174*   Lipid Profile: No results for input(s): CHOL, HDL, LDLCALC, TRIG, CHOLHDL, LDLDIRECT in the last 72 hours. Thyroid Function Tests: No results for input(s): TSH, T4TOTAL, FREET4, T3FREE, THYROIDAB in the last 72 hours. Anemia Panel: No results for input(s): VITAMINB12, FOLATE, FERRITIN, TIBC, IRON, RETICCTPCT in the last 72 hours. Urine analysis:    Component Value Date/Time   COLORURINE AMBER (A) 09/28/2017 1802   APPEARANCEUR CLEAR 09/28/2017 1802   LABSPEC 1.014 09/28/2017 1802   PHURINE 5.0 09/28/2017 1802   GLUCOSEU NEGATIVE 09/28/2017 1802   HGBUR NEGATIVE 09/28/2017 1802   BILIRUBINUR NEGATIVE 09/28/2017 1802   KETONESUR NEGATIVE 09/28/2017 1802   PROTEINUR NEGATIVE 09/28/2017 1802   UROBILINOGEN 0.2 02/04/2014 0939   NITRITE NEGATIVE 09/28/2017 1802   LEUKOCYTESUR NEGATIVE 09/28/2017 1802    Radiological Exams on Admission: Ct Abdomen Pelvis Wo Contrast  Result Date:  09/28/2017 CLINICAL DATA:  Patient with nausea, vomiting and diarrhea. Prior partial gallbladder removal 09/22/2017. EXAM: CT ABDOMEN AND PELVIS WITHOUT CONTRAST TECHNIQUE: Multidetector CT imaging of the abdomen and pelvis was performed following  the standard protocol without IV contrast. COMPARISON:  CT abdomen pelvis 09/17/2017. FINDINGS: Lower chest: Subpleural consolidation within the right lower lobe. Small right pleural effusion. Trace left pleural effusion. Hepatobiliary: Liver is normal in size and contour. Postsurgical changes compatible with partial colectomy. Surgical drain terminates along the hepatic margin. No fluid collection is identified. Mild common bile duct dilatation measuring 11 mm. There is a 9 mm stone within the distal common bile duct (image 29; series 2). Pancreas: Unremarkable Spleen: Unremarkable Adrenals/Urinary Tract: Adrenal glands are normal. Kidneys are symmetric in size. No hydronephrosis. Small fluid attenuation structure inferior pole right kidney. Urinary bladder is unremarkable. Stomach/Bowel: Extensive descending and sigmoid colonic diverticulosis. No CT evidence for acute diverticulitis. The appendix is normal. No evidence for bowel obstruction. No significant free fluid or free intraperitoneal air. Normal morphology of the stomach. Vascular/Lymphatic: Normal caliber abdominal aorta. Peripheral calcified atherosclerotic plaque. No retroperitoneal lymphadenopathy. Reproductive: Prostate is mildly enlarged. Other: Small bilateral fat containing inguinal hernias. Musculoskeletal: No aggressive or acute appearing osseous lesions. Lumbar spine degenerative changes. IMPRESSION: 1. There is a 9 mm stone within the distal common bile duct resulting in mild common bile duct dilatation. 2. Postsurgical changes compatible with cholecystectomy. Drain catheter courses along the hepatic margin. No fluid collection is identified. 3. Consolidation within the right lower lobe and adjacent  small right pleural effusion. Findings may represent atelectasis or infection. Electronically Signed   By: Annia Belt M.D.   On: 09/28/2017 19:15   Dg Chest 2 View  Result Date: 09/28/2017 CLINICAL DATA:  Nausea, vomiting and diarrhea. EXAM: CHEST  2 VIEW COMPARISON:  Chest radiograph 09/17/2017 FINDINGS: Single lead AICD device overlies the left hemithorax. Stable cardiomegaly status post median sternotomy. Elevation right hemidiaphragm. Heterogeneous opacities right lung base. Small right pleural effusion. No pneumothorax. IMPRESSION: Heterogeneous opacities right lung base may represent atelectasis or infection. Small right pleural effusion. Electronically Signed   By: Annia Belt M.D.   On: 09/28/2017 19:24    EKG: Independently reviewed. Ventricular paced rhythm afib/flutter. No change from prior.  Assessment/Plan Principal Problem:   Cholangitis Active Problems:   S/P mitral valve repair   S/P CABG x 3   HTN (hypertension)   Automatic implantable cardioverter-defibrillator in situ   Diabetes mellitus, type 2 (HCC)   Atrial fibrillation (HCC)   Acute renal failure superimposed on stage 3 chronic kidney disease (HCC)   Elevated troponin    1. Acute cholangitis with common bile duct obstruction secondary to 9 mm gallstone in the setting of recent subtotal lap chole with drain placement.  IV fluid for maintenance.  Rocephin 2 g IV daily as well as Flagyl 500mg  IV q 8 hours. Blood cultures x 2. NPO except sips and meds. Avoid Eliquis at this time and place on Heparin drip for prophylaxis. CMP in AM.  Transfer to telemetry bed at [facility] with further evaluation by GI and general surgery upon arrival there.  Currently stable to wait in ED until bed opens up by AM. 2. AK I on CKD stage III.  Continue IV fluid and avoid nephrotoxic agents.  Monitor I's and O's and daily weights. Avoid Lasix. 3. Type 2 diabetes.  Sliding scale insulin. 4. Elevated troponin in the setting of CAD with prior  CABG-asymptomatic.  Monitor with repeat troponins x3.   DVT prophylaxis: Eliquis held and started on heparin drip Code Status: Full Family Communication: Wife and daughter at bedside Disposition Plan:To [facility] Tele bed when available in AM Consults called:EDP  to GI and GS at [facility] Admission status: Inpatient, Tele   Shaheen Mende Hoover Brunette DO [facility] Pager [phone]  If 7PM-7AM, please contact night-coverage [email or web address] Password [email or web address]  09/28/2017, 11:01 PM

## 2017-09-28 NOTE — ED Notes (Signed)
Pt has noticed decreased urine output, pt has drain in place right side of abdomen

## 2017-09-29 ENCOUNTER — Inpatient Hospital Stay (HOSPITAL_COMMUNITY): Payer: Medicare HMO | Admitting: Certified Registered"

## 2017-09-29 ENCOUNTER — Inpatient Hospital Stay (HOSPITAL_COMMUNITY): Payer: Medicare HMO

## 2017-09-29 ENCOUNTER — Encounter (HOSPITAL_COMMUNITY): Payer: Self-pay | Admitting: *Deleted

## 2017-09-29 ENCOUNTER — Encounter (HOSPITAL_COMMUNITY)
Admission: EM | Disposition: A | Discharge status: Home Health | Payer: Self-pay | Source: Home / Self Care | Attending: Internal Medicine

## 2017-09-29 DIAGNOSIS — R748 Abnormal levels of other serum enzymes: Secondary | ICD-10-CM

## 2017-09-29 DIAGNOSIS — R112 Nausea with vomiting, unspecified: Secondary | ICD-10-CM

## 2017-09-29 DIAGNOSIS — R197 Diarrhea, unspecified: Secondary | ICD-10-CM

## 2017-09-29 HISTORY — PX: ERCP: SHX5425

## 2017-09-29 LAB — APTT
aPTT: 47 seconds — ABNORMAL HIGH (ref 24–36)
aPTT: 67 seconds — ABNORMAL HIGH (ref 24–36)

## 2017-09-29 LAB — GLUCOSE, CAPILLARY
GLUCOSE-CAPILLARY: 90 mg/dL (ref 65–99)
GLUCOSE-CAPILLARY: 106 mg/dL — AB (ref 65–99)
GLUCOSE-CAPILLARY: 87 mg/dL (ref 65–99)
Glucose-Capillary: 55 mg/dL — ABNORMAL LOW (ref 65–99)
Glucose-Capillary: 343 mg/dL — ABNORMAL HIGH (ref 65–99)

## 2017-09-29 LAB — TROPONIN I: Troponin I: 0.03 ng/mL (ref <0.03)

## 2017-09-29 LAB — HEPARIN LEVEL (UNFRACTIONATED)

## 2017-09-29 SURGERY — ERCP, WITH INTERVENTION IF INDICATED
Anesthesia: General

## 2017-09-29 MED ORDER — LACTATED RINGERS IV SOLN
INTRAVENOUS | Status: DC | PRN
  Administered 2017-09-29: 14:00:00 via INTRAVENOUS

## 2017-09-29 MED ORDER — INSULIN ASPART 100 UNIT/ML ~~LOC~~ SOLN
0.0000 [IU] | SUBCUTANEOUS | Status: DC
  Administered 2017-09-29 – 2017-09-30 (×2): 7 [IU] via SUBCUTANEOUS
  Administered 2017-09-30 (×2): 5 [IU] via SUBCUTANEOUS

## 2017-09-29 MED ORDER — LIDOCAINE 2% (20 MG/ML) 5 ML SYRINGE
INTRAMUSCULAR | Status: DC | PRN
  Administered 2017-09-29: 60 mg via INTRAVENOUS

## 2017-09-29 MED ORDER — DEXAMETHASONE SODIUM PHOSPHATE 10 MG/ML IJ SOLN
INTRAMUSCULAR | Status: DC | PRN
  Administered 2017-09-29: 5 mg via INTRAVENOUS

## 2017-09-29 MED ORDER — DEXTROSE-NACL 5-0.45 % IV SOLN
INTRAVENOUS | Status: DC
  Administered 2017-09-29 – 2017-09-30 (×2): via INTRAVENOUS

## 2017-09-29 MED ORDER — INDOMETHACIN 50 MG RE SUPP
RECTAL | Status: AC
  Filled 2017-09-29: qty 2

## 2017-09-29 MED ORDER — INDOMETHACIN 50 MG RE SUPP
RECTAL | Status: DC | PRN
  Administered 2017-09-29: 100 mg via RECTAL

## 2017-09-29 MED ORDER — DEXTROSE 50 % IV SOLN
INTRAVENOUS | Status: AC
  Administered 2017-09-29: 25 mL
  Filled 2017-09-29: qty 50

## 2017-09-29 MED ORDER — IOPAMIDOL (ISOVUE-300) INJECTION 61%
INTRAVENOUS | Status: AC
  Filled 2017-09-29: qty 50

## 2017-09-29 MED ORDER — SUCCINYLCHOLINE CHLORIDE 20 MG/ML IJ SOLN
INTRAMUSCULAR | Status: DC | PRN
  Administered 2017-09-29: 100 mg via INTRAVENOUS

## 2017-09-29 MED ORDER — SODIUM CHLORIDE 0.9 % IV SOLN
INTRAVENOUS | Status: DC | PRN
  Administered 2017-09-29: 20 mL

## 2017-09-29 MED ORDER — HEPARIN (PORCINE) IN NACL 100-0.45 UNIT/ML-% IJ SOLN
1300.0000 [IU]/h | INTRAMUSCULAR | Status: DC
  Administered 2017-09-29: 1300 [IU]/h via INTRAVENOUS
  Filled 2017-09-29: qty 250

## 2017-09-29 MED ORDER — GLUCAGON HCL RDNA (DIAGNOSTIC) 1 MG IJ SOLR
INTRAMUSCULAR | Status: AC
  Filled 2017-09-29: qty 1

## 2017-09-29 MED ORDER — PHENYLEPHRINE 40 MCG/ML (10ML) SYRINGE FOR IV PUSH (FOR BLOOD PRESSURE SUPPORT)
PREFILLED_SYRINGE | INTRAVENOUS | Status: DC | PRN
  Administered 2017-09-29 (×4): 80 ug via INTRAVENOUS

## 2017-09-29 MED ORDER — ONDANSETRON HCL 4 MG/2ML IJ SOLN
INTRAMUSCULAR | Status: DC | PRN
  Administered 2017-09-29: 4 mg via INTRAVENOUS

## 2017-09-29 MED ORDER — FENTANYL CITRATE (PF) 250 MCG/5ML IJ SOLN
INTRAMUSCULAR | Status: DC | PRN
  Administered 2017-09-29: 75 ug via INTRAVENOUS
  Administered 2017-09-29: 25 ug via INTRAVENOUS

## 2017-09-29 MED ORDER — EPHEDRINE SULFATE-NACL 50-0.9 MG/10ML-% IV SOSY
PREFILLED_SYRINGE | INTRAVENOUS | Status: DC | PRN
  Administered 2017-09-29: 10 mg via INTRAVENOUS
  Administered 2017-09-29: 15 mg via INTRAVENOUS
  Administered 2017-09-29: 20 mg via INTRAVENOUS

## 2017-09-29 MED ORDER — SODIUM CHLORIDE 0.9 % IV SOLN: INTRAVENOUS | Status: DC

## 2017-09-29 MED ORDER — PROPOFOL 10 MG/ML IV BOLUS
INTRAVENOUS | Status: DC | PRN
  Administered 2017-09-29: 100 mg via INTRAVENOUS

## 2017-09-29 MED ORDER — MORPHINE SULFATE (PF) 4 MG/ML IV SOLN
1.0000 mg | INTRAVENOUS | Status: DC | PRN
  Administered 2017-09-29 – 2017-10-01 (×3): 1 mg via INTRAVENOUS
  Filled 2017-09-29 (×3): qty 1

## 2017-09-29 NOTE — Progress Notes (Signed)
Spoke with Dr. Elnoria Howard about patient on heparin drip, verbal order given to stop drip now (1230). Spoke with bedside RN and heparin stopped.

## 2017-09-29 NOTE — Interval H&P Note (Signed)
History and Physical Interval Note:  09/29/2017 1:41 PM  Wayne Gordon  has presented today for surgery, with the diagnosis of Choledocholithiasis and possible ascending cholangitis  The various methods of treatment have been discussed with the patient and family. After consideration of risks, benefits and other options for treatment, the patient has consented to  Procedure(s): ENDOSCOPIC RETROGRADE CHOLANGIOPANCREATOGRAPHY (ERCP) (N/A) as a surgical intervention .  The patient's history has been reviewed, patient examined, no change in status, stable for surgery.  I have reviewed the patient's chart and labs.  Questions were answered to the patient's satisfaction.     Dulse Rutan D

## 2017-09-29 NOTE — Op Note (Addendum)
[facility] Patient Name: Wayne Gordon Procedure Date : 09/29/2017 MRN: [id number] Attending MD: Carol Ada , MD Date of Birth: 27-Oct-1933 CSN: [id number] Age: 84 Admit Type: Inpatient Procedure:                ERCP Indications:              Biliary dilation on Ultrasound Providers:                Carol Ada, MD, Cleda Daub, RN, Cherylynn Ridges,                            Technician Referring MD:              Medicines:                General Anesthesia Complications:            Hypotension Estimated Blood Loss:     Estimated blood loss: none. Procedure:                Pre-Anesthesia Assessment:                           - Prior to the procedure, a History and Physical                            was performed, and patient medications and                            allergies were reviewed. The patient's tolerance of                            previous anesthesia was also reviewed. The risks                            and benefits of the procedure and the sedation                            options and risks were discussed with the patient.                            All questions were answered, and informed consent                            was obtained. Prior Anticoagulants: The patient has                            taken no previous anticoagulant or antiplatelet                            agents. ASA Grade Assessment: III - A patient with                            severe systemic disease. After reviewing the risks                            and benefits, the  patient was deemed in                            satisfactory condition to undergo the procedure.                           - Sedation was administered by an anesthesia                            professional. General anesthesia was attained.                           After obtaining informed consent, the scope was                            passed under direct vision. Throughout the                             procedure, the patient's blood pressure, pulse, and                            oxygen saturations were monitored continuously. The                            [id number]) scope was introduced through                            the mouth, and used to inject contrast into and                            used to inject contrast into the ventral pancreatic                            duct. The ERCP was performed with difficulty due to                            challenging cannulation and the patient's                            cardiovascular instability (hypotension). The                            patient tolerated the procedure well. Findings:      The major papilla was normal. A short 0.035 inch Stiff Jagwire was       passed into the ventral pancreatic duct. Cannulation was difficult and       the angulation of the ampulla made it difficult to locate the CBD. A two       wire cannulation was attempted, i.e., the first guidewire was left in       the PD and a secondary wire was used. The CBD was not be to be       identified. During these attempts the patient suffered with hypotension       and his SBP was at 66 mmHg. Vasopression was administered per  anesthesia, which improved his blood pressure. Unfortunately this was       not sustainable and his BP dropped again. At the discretion of       anesthesia the procedure was aborted. Impression:               - The major papilla appeared normal. Recommendation:           - Return patient to hospital ward for ongoing care.                           - NPO.                           - Consult IR for PTC. Procedure Code(s):        --- Professional ---                           [phone], Endoscopic retrograde                            cholangiopancreatography (ERCP); diagnostic,                            including collection of specimen(s) by brushing or                            washing, when performed (separate  procedure) Diagnosis Code(s):        --- Professional ---                           K83.8, Other specified diseases of biliary tract CPT copyright 2016 [employer] Medical Association. All rights reserved. The codes documented in this report are preliminary and upon coder review may  be revised to meet current compliance requirements. Carol Ada, MD Carol Ada, MD 09/29/2017 1:39:16 PM This report has been signed electronically. Number of Addenda: 0

## 2017-09-29 NOTE — ED Notes (Signed)
Patient transported to [facility] via [facility].

## 2017-09-29 NOTE — Progress Notes (Signed)
1528 Received patient from PACU. Alert and oriented. Patient resting comfortably in the chair.

## 2017-09-29 NOTE — ED Notes (Addendum)
CRITICAL VALUE ALERT  Critical Value:  Troponin 0.03 mg/dl  Date & Time Notied:  8/0/32  & 0129 hrs  Provider Notified: Dr. Sherryll Burger  Orders Received/Actions taken: N/A

## 2017-09-29 NOTE — Progress Notes (Signed)
Order received from [facility] NP to hold heparin drip tonight per Dr. Haywood Pao request as patient is s/p ERCP.

## 2017-09-29 NOTE — Progress Notes (Signed)
[facility] PROGRESS NOTE    Progress Note  Wayne Gordon  ZOX:[id number] DOB: Mar 31, 1934 DOA: 09/28/2017 PCP: Samuel Jester, DO     Brief Narrative:   Wayne Gordon is an 84 y.o. male past medical history coronary artery disease, chronic systolic heart failure, atrial fibrillation on Eliquis, recent cholecystectomy with drain in place due to cholangitis and history of nausea and vomiting, CT scan showed a 9 mm stone lodged in the common bile  Assessment/Plan:   Sepsis /acute Cholangitis With a CT scan done on admission showed 9 mm stone, admitted to the hospital started on IV fluids and antibiotics with Flagyl. Blood cultures were done. Cont IV heparin. General surgery GI has been consulted and awaiting recommendations. Blood cultures from last admission showed Klebsiella sensitive to Rocephin  Acute kidney injury on CKD III: Creatinine on 09/22/2017 was 1.8 on admission 2.5 in the setting of sepsis. PRe-renal, started on IV fluids. Recheck in the am.  DM 2: Keep NPO start SSI.  Elevated troponins: Setting of CAD, he has remained asymptomatic cardiac biomarkers have, setting of sepsis and chronic renal disease  HTN (hypertension) Hold anti-hypertesive medications. Use hydralazine IV PRN for SBP > 180.  Automatic implantable cardioverter-defibrillator in situ  Chronic atrial fibrillation (HCC) Rate controlled, on IV heparin.   DVT prophylaxis: Heparin Family Communication:none Disposition Plan/Barrier to D/C: unable to determine Code Status:     Code Status Orders  (From admission, onward)        Start     Ordered   09/28/17 2307  Full code  Continuous     09/28/17 2309    Code Status History    Date Active Date Inactive Code Status Order ID Comments User Context   09/17/2017 17:14 09/22/2017 20:13 Full Code [id number]  Cleora Fleet, MD Inpatient   06/23/2016 19:27 06/25/2016 14:21 Full Code [id number]  Standley Brooking, MD Inpatient   07/10/2015 16:05 07/11/2015 19:17 Full Code [id number]  Sheilah Pigeon, PA-C Inpatient   09/02/2014 00:16 09/02/2014 20:08 Full Code [id number]  Pearson Grippe, MD Inpatient   02/03/2014 21:39 02/05/2014 20:01 Full Code [id number]  Eduard Clos, MD Inpatient   10/19/2011 08:26 10/24/2011 18:57 Full Code [id number]  Purcell Nails, MD Inpatient   10/17/2011 14:56 10/19/2011 08:26 Full Code [id number]  Jana Hakim, RN Inpatient    Advance Directive Documentation     Most Recent Value  Type of Advance Directive  Living will  Pre-existing out of facility DNR order (yellow form or pink MOST form)  No data  "MOST" Form in Place?  No data        IV Access:    Peripheral IV   Procedures and diagnostic studies:   Ct Abdomen Pelvis Wo Contrast  Result Date: 09/28/2017 CLINICAL DATA:  Patient with nausea, vomiting and diarrhea. Prior partial gallbladder removal 09/22/2017. EXAM: CT ABDOMEN AND PELVIS WITHOUT CONTRAST TECHNIQUE: Multidetector CT imaging of the abdomen and pelvis was performed following the standard protocol without IV contrast. COMPARISON:  CT abdomen pelvis 09/17/2017. FINDINGS: Lower chest: Subpleural consolidation within the right lower lobe. Small right pleural effusion. Trace left pleural effusion. Hepatobiliary: Liver is normal in size and contour. Postsurgical changes compatible with partial colectomy. Surgical drain terminates along the hepatic margin. No fluid collection is identified. Mild common bile duct dilatation measuring 11 mm. There is a 9 mm stone within the distal common bile duct (image 29; series 2). Pancreas: Unremarkable Spleen: Unremarkable Adrenals/Urinary Tract: Adrenal glands are normal.  Kidneys are symmetric in size. No hydronephrosis. Small fluid attenuation structure inferior pole right kidney. Urinary bladder is unremarkable. Stomach/Bowel: Extensive descending and sigmoid colonic diverticulosis. No CT evidence for acute diverticulitis. The appendix is  normal. No evidence for bowel obstruction. No significant free fluid or free intraperitoneal air. Normal morphology of the stomach. Vascular/Lymphatic: Normal caliber abdominal aorta. Peripheral calcified atherosclerotic plaque. No retroperitoneal lymphadenopathy. Reproductive: Prostate is mildly enlarged. Other: Small bilateral fat containing inguinal hernias. Musculoskeletal: No aggressive or acute appearing osseous lesions. Lumbar spine degenerative changes. IMPRESSION: 1. There is a 9 mm stone within the distal common bile duct resulting in mild common bile duct dilatation. 2. Postsurgical changes compatible with cholecystectomy. Drain catheter courses along the hepatic margin. No fluid collection is identified. 3. Consolidation within the right lower lobe and adjacent small right pleural effusion. Findings may represent atelectasis or infection. Electronically Signed   By: Annia Belt M.D.   On: 09/28/2017 19:15   Dg Chest 2 View  Result Date: 09/28/2017 CLINICAL DATA:  Nausea, vomiting and diarrhea. EXAM: CHEST  2 VIEW COMPARISON:  Chest radiograph 09/17/2017 FINDINGS: Single lead AICD device overlies the left hemithorax. Stable cardiomegaly status post median sternotomy. Elevation right hemidiaphragm. Heterogeneous opacities right lung base. Small right pleural effusion. No pneumothorax. IMPRESSION: Heterogeneous opacities right lung base may represent atelectasis or infection. Small right pleural effusion. Electronically Signed   By: Annia Belt M.D.   On: 09/28/2017 19:24     Medical Consultants:    None.  Anti-Infectives:   IV rocephin and flagyl  Subjective:    Wayne Gordon patient relates his pain is controlled no nausea.  Objective:    Vitals:   09/29/17 0330 09/29/17 0400 09/29/17 0430 09/29/17 0528  BP: (!) 115/58 (!) 109/55 (!) 119/57 121/61  Pulse: (!) 54 (!) 53 (!) 55 (!) 55  Resp: 18 18 (!) 21 17  Temp:    98.4 F (36.9 C)  TempSrc:    Oral  SpO2: 97% 93% 95% 96%   Weight:    108.8 kg (239 lb 13.8 oz)  Height:    6\' 5"  (1.956 m)    Intake/Output Summary (Last 24 hours) at 09/29/2017 0945 Last data filed at 09/29/2017 0743 Gross per 24 hour  Intake 703.75 ml  Output 480 ml  Net 223.75 ml   Filed Weights   09/28/17 1750 09/29/17 0528  Weight: 107.5 kg (237 lb) 108.8 kg (239 lb 13.8 oz)    Exam: General exam: In no acute distress. Respiratory system: Good air movement and clear to auscultation. Cardiovascular system: S1 & S2 heard, RRR Gastrointestinal system: Abdomen is nondistended, soft and nontender. Drain in placed.  Central nervous system: Alert and oriented. No focal neurological deficits. Extremities: No pedal edema. Skin: No rashes, lesions or ulcers Psychiatry: Judgement and insight appear normal. Mood & affect appropriate.    Data Reviewed:    Labs: Basic Metabolic Panel: Recent Labs  Lab 09/28/17 1914  NA 130*  K 5.0  CL 96*  CO2 25  GLUCOSE 148*  BUN 48*  CREATININE 2.45*  CALCIUM 8.8*   GFR Estimated Creatinine Clearance: 32.4 mL/min (A) (by C-G formula based on SCr of 2.45 mg/dL (H)). Liver Function Tests: Recent Labs  Lab 09/28/17 1914  AST 55*  ALT 110*  ALKPHOS 211*  BILITOT 2.4*  PROT 6.4*  ALBUMIN 2.9*   Recent Labs  Lab 09/28/17 1914  LIPASE 41   No results for input(s): AMMONIA in the last 168 hours.  Coagulation profile No results for input(s): INR, PROTIME in the last 168 hours.  CBC: Recent Labs  Lab 09/28/17 1914  WBC 19.6*  NEUTROABS 17.2*  HGB 13.3  HCT 40.6  MCV 95.8  PLT 223   Cardiac Enzymes: Recent Labs  Lab 09/28/17 1914 09/29/17 0044  TROPONINI 0.03* 0.03*   BNP (last 3 results) No results for input(s): PROBNP in the last 8760 hours. CBG: Recent Labs  Lab 09/22/17 1144 09/28/17 2350 09/29/17 0742  GLUCAP 174* 102* 90   D-Dimer: No results for input(s): DDIMER in the last 72 hours. Hgb A1c: No results for input(s): HGBA1C in the last 72 hours. Lipid  Profile: No results for input(s): CHOL, HDL, LDLCALC, TRIG, CHOLHDL, LDLDIRECT in the last 72 hours. Thyroid function studies: No results for input(s): TSH, T4TOTAL, T3FREE, THYROIDAB in the last 72 hours.  Invalid input(s): FREET3 Anemia work up: No results for input(s): VITAMINB12, FOLATE, FERRITIN, TIBC, IRON, RETICCTPCT in the last 72 hours. Sepsis Labs: Recent Labs  Lab 09/28/17 1914  WBC 19.6*  LATICACIDVEN 1.3   Microbiology Recent Results (from the past 240 hour(s))  Culture, blood (routine x 2)     Status: None (Preliminary result)   Collection Time: 09/28/17  7:14 PM  Result Value Ref Range Status   Specimen Description RIGHT ANTECUBITAL  Final   Special Requests   Final    BOTTLES DRAWN AEROBIC AND ANAEROBIC Blood Culture adequate volume   Culture PENDING  Incomplete   Report Status PENDING  Incomplete  Culture, blood (routine x 2)     Status: None (Preliminary result)   Collection Time: 09/28/17  7:15 PM  Result Value Ref Range Status   Specimen Description BLOOD RIGHT HAND  Final   Special Requests   Final    BOTTLES DRAWN AEROBIC ONLY Blood Culture adequate volume   Culture PENDING  Incomplete   Report Status PENDING  Incomplete     Medications:   . carvedilol  12.5 mg Oral BID  . insulin aspart  0-15 Units Subcutaneous TID WC  . insulin aspart  0-5 Units Subcutaneous QHS  . pantoprazole  40 mg Oral Daily   Continuous Infusions: . sodium chloride 75 mL/hr at 09/28/17 2345  . cefTRIAXone (ROCEPHIN)  IV Stopped (09/29/17 0014)  . heparin 1,300 Units/hr (09/29/17 0418)  . metronidazole Stopped (09/29/17 0848)     LOS: 1 day   Marinda Elk  [facility] Pager [phone]  *Please refer to [email or web address], password [email or web address] to get updated schedule on who will round on this patient, as hospitalists switch teams weekly. If 7PM-7AM, please contact night-coverage at [email or web address], password [email or web address] for any overnight needs.  09/29/2017, 9:45 AM

## 2017-09-29 NOTE — Progress Notes (Signed)
1245 Patient take down for ERCP. Report given to [facility].

## 2017-09-29 NOTE — Progress Notes (Signed)
Inpatient Diabetes Program Recommendations  AACE/ADA: New Consensus Statement on Inpatient Glycemic Control (2015)  Target Ranges:  Prepandial:   less than 140 mg/dL      Peak postprandial:   less than 180 mg/dL (1-2 hours)      Critically ill patients:  140 - 180 mg/dL  Results for ROSHARD, HUDELSON (MRN [id number]) as of 09/29/2017 12:24  Ref. Range 09/28/2017 23:50 09/29/2017 07:42 09/29/2017 12:01  Glucose-Capillary Latest Ref Range: 65 - 99 mg/dL 030 (H) 90 55 (L)   Results for CRU, BORDAS (MRN [id number]) as of 09/29/2017 12:24  Ref. Range 09/17/2017 10:02  Hemoglobin A1C Latest Ref Range: 4.8 - 5.6 % 8.0 (H)   Review of Glycemic Control  Diabetes history: DM2 Outpatient Diabetes medications: Glipizide XL 10 mg BID, Toujeo 12-15 units daily Current orders for Inpatient glycemic control: Novolog 0-9 units Q4H  Inpatient Diabetes Program Recommendations:  Hypoglycemia: Noted CBG of 55 mg/dl at 22:63 today. Patient has Novolog correction ordered but none given since patient arrived at the hospital. Hypoglycemia likely due to outpatient DM medications.   Thanks, Orlando Penner, RN, MSN, CDE Diabetes Coordinator Inpatient Diabetes Program [phone] (Team Pager from 8am to 5pm)

## 2017-09-29 NOTE — Anesthesia Procedure Notes (Signed)
Procedure Name: Intubation Date/Time: 09/29/2017 1:49 PM Performed by: Elliot Dally, CRNA Pre-anesthesia Checklist: Patient identified, Emergency Drugs available, Suction available and Patient being monitored Patient Re-evaluated:Patient Re-evaluated prior to induction Oxygen Delivery Method: Circle System Utilized Preoxygenation: Pre-oxygenation with 100% oxygen Induction Type: IV induction Ventilation: Mask ventilation without difficulty Laryngoscope Size: Miller and 3 Grade View: Grade I Tube type: Oral Tube size: 7.5 mm Number of attempts: 1 Airway Equipment and Method: Stylet and Oral airway Placement Confirmation: ETT inserted through vocal cords under direct vision,  positive ETCO2 and breath sounds checked- equal and bilateral Secured at: 23 cm Tube secured with: Tape Dental Injury: Teeth and Oropharynx as per pre-operative assessment

## 2017-09-29 NOTE — Consult Note (Signed)
Reason for Consult: Choledocholithiasis and ascending cholangitis Referring Physician: [facility]  [place] [facility] HPI: This is an 84 year old male with multiple comorbidities, namely CHF, CAD s/p CABG, MVR, and s/p AICD placement admitted for ascending cholangitis secondary to choledocholithiasis.  He is s/p subtotal cholecystectomy with a drain placement and he was well after his surgery for several days and then he started to have fever and chills.  As a result he represented to the ER and he was found to have an elevated WBC at 19,000 and an elevated creatinine at 2.45.  The liver enzymes were also in an obstructive pattern and imaging showed a CBD stone.  He was then admitted and transferred to [facility] for further evaluation and treatment.     Past Medical History:  Diagnosis Date  . Allergy to ACE inhibitors    Angioedema many years ago; patient has tolerated Losartan (ARB) in the past - it was held during [facility] for GI bleed and worsening renal function >> resume Losartan 25 mg QD 06/2015  . Arthritis   . Atrial fibrillation (HCC)    PAF, CHADs2Vasc = 5  . Carotid artery disease (HCC)    60-79% LICA  . CHF (congestive heart failure) (HCC)   . Coronary artery disease   . Diabetes mellitus   . GERD (gastroesophageal reflux disease)   . Hyperlipidemia   . Hypertension   . ICD (implantable cardiac defibrillator) in place 03/28/2012   B[place]ronik, Dr. Ladona Ridgel 03/28/12  . Ischemic cardiomyopathy    S/P CABG x 3; EF 25%  . Mitral regurgitation    S/P mitral valve repair 2013  . Myocardial infarction (HCC)   . Renal insufficiency     Past Surgical History:  Procedure Laterality Date  . CHOLECYSTECTOMY N/A 09/20/2017   Procedure: LAPAROSCOPIC SUBTOTAL CHOLECYSTECTOMY;  Surgeon: Axel Filler, MD;  Location: [facility] OR;  Service: General;  Laterality: N/A;  . COLONOSCOPY N/A 04/24/2014   RMR Pancolonic diverticulosis  . CORONARY ARTERY BYPASS GRAFT  10/17/2011   Procedure:  CORONARY ARTERY BYPASS GRAFTING (CABG);  Surgeon: Purcell Nails, MD;  Location: [facility] OR;  Service: Open Heart Surgery;  Laterality: N/A;  . ESOPHAGOGASTRODUODENOSCOPY N/A 04/24/2014   RMR Subtle nodularity the gastric mucosa of uncertain significance-status post biopsy. Hiatal hernia. chronic inflammation, no H.pylori  . ESOPHAGOGASTRODUODENOSCOPY N/A 06/24/2016   Dr. Jena Gauss: normal esophagus, small hiatal hernia, normal duodenum  . ICD  03/28/2012  . IMPLANTABLE CARDIOVERTER DEFIBRILLATOR IMPLANT N/A 03/28/2012   Procedure: IMPLANTABLE CARDIOVERTER DEFIBRILLATOR IMPLANT;  Surgeon: Marinus Maw, MD;  Location: [facility] CATH LAB;  Service: Cardiovascular;  Laterality: N/A;  . KNEE ARTHROSCOPY  ~ 2008   right  . LEFT AND RIGHT HEART CATHETERIZATION WITH CORONARY ANGIOGRAM N/A 10/12/2011   Procedure: LEFT AND RIGHT HEART CATHETERIZATION WITH CORONARY ANGIOGRAM;  Surgeon: Iran Ouch, MD;  Location: [facility] CATH LAB;  Service: Cardiovascular;  Laterality: N/A;  . MITRAL VALVE REPAIR  10/17/2011   Procedure: MITRAL VALVE REPAIR (MVR);  Surgeon: Purcell Nails, MD;  Location: [facility] OR;  Service: Open Heart Surgery;  Laterality: N/A;  . NASAL SINUS SURGERY  1990's   right    Family History  Problem Relation Age of Onset  . Heart disease Mother   . Diabetes Father   . Cardiomyopathy Father   . Colon cancer Neg Hx     Social History:  reports that  has never smoked. he has never used smokeless tobacco. He reports that he does not  drink alcohol or use drugs.  Allergies:    Medications:  Scheduled: . [MAR Hold] carvedilol  12.5 mg Oral BID  . [MAR Hold] insulin aspart  0-9 Units Subcutaneous Q4H  . [MAR Hold] pantoprazole  40 mg Oral Daily   Continuous: . sodium chloride    . [MAR Hold] cefTRIAXone (ROCEPHIN)  IV Stopped (09/29/17 0014)  . dextrose 5 % and 0.45% NaCl    . [MAR Hold] metronidazole Stopped (09/29/17 0848)    Results for orders placed or performed during the hospital encounter of  09/28/17 (from the past 24 hour(s))  Urinalysis, Routine w reflex microscopic     Status: Abnormal   Collection Time: 09/28/17  6:02 PM  Result Value Ref Range   Color, Urine AMBER (A) YELLOW   APPearance CLEAR CLEAR   Specific Gravity, Urine 1.014 1.005 - 1.030   pH 5.0 5.0 - 8.0   Glucose, UA NEGATIVE NEGATIVE mg/dL   Hgb urine dipstick NEGATIVE NEGATIVE   Bilirubin Urine NEGATIVE NEGATIVE   Ketones, ur NEGATIVE NEGATIVE mg/dL   Protein, ur NEGATIVE NEGATIVE mg/dL   Nitrite NEGATIVE NEGATIVE   Leukocytes, UA NEGATIVE NEGATIVE  Comprehensive metabolic panel     Status: Abnormal   Collection Time: 09/28/17  7:14 PM  Result Value Ref Range   Sodium 130 (L) 135 - 145 mmol/L   Potassium 5.0 3.5 - 5.1 mmol/L   Chloride 96 (L) 101 - 111 mmol/L   CO2 25 22 - 32 mmol/L   Glucose, Bld 148 (H) 65 - 99 mg/dL   BUN 48 (H) 6 - 20 mg/dL   Creatinine, Ser 4.09 (H) 0.61 - 1.24 mg/dL   Calcium 8.8 (L) 8.9 - 10.3 mg/dL   Total Protein 6.4 (L) 6.5 - 8.1 g/dL   Albumin 2.9 (L) 3.5 - 5.0 g/dL   AST 55 (H) 15 - 41 U/L   ALT 110 (H) 17 - 63 U/L   Alkaline Phosphatase 211 (H) 38 - 126 U/L   Total Bilirubin 2.4 (H) 0.3 - 1.2 mg/dL   GFR calc non Af Amer 23 (L) >60 mL/min   GFR calc Af Amer 27 (L) >60 mL/min   Anion gap 9 5 - 15  Lipase, blood     Status: None   Collection Time: 09/28/17  7:14 PM  Result Value Ref Range   Lipase 41 11 - 51 U/L  Troponin I     Status: Abnormal   Collection Time: 09/28/17  7:14 PM  Result Value Ref Range   Troponin I 0.03 (HH) <0.03 ng/mL  Lactic acid, plasma     Status: None   Collection Time: 09/28/17  7:14 PM  Result Value Ref Range   Lactic Acid, Venous 1.3 0.5 - 1.9 mmol/L  CBC with Differential     Status: Abnormal   Collection Time: 09/28/17  7:14 PM  Result Value Ref Range   WBC 19.6 (H) 4.0 - 10.5 K/uL   RBC 4.24 4.22 - 5.81 MIL/uL   Hemoglobin 13.3 13.0 - 17.0 g/dL   HCT 81.1 91.4 - 78.2 %   MCV 95.8 78.0 - 100.0 fL   MCH 31.4 26.0 - 34.0 pg    MCHC 32.8 30.0 - 36.0 g/dL   RDW 95.6 21.3 - 08.6 %   Platelets 223 150 - 400 K/uL   Neutrophils Relative % 87 %   Neutro Abs 17.2 (H) 1.7 - 7.7 K/uL   Lymphocytes Relative 7 %   Lymphs Abs 1.4 0.7 -  4.0 K/uL   Monocytes Relative 5 %   Monocytes Absolute 0.9 0.1 - 1.0 K/uL   Eosinophils Relative 1 %   Eosinophils Absolute 0.1 0.0 - 0.7 K/uL   Basophils Relative 0 %   Basophils Absolute 0.0 0.0 - 0.1 K/uL  Culture, blood (routine x 2)     Status: None (Preliminary result)   Collection Time: 09/28/17  7:14 PM  Result Value Ref Range   Specimen Description RIGHT ANTECUBITAL    Special Requests      BOTTLES DRAWN AEROBIC AND ANAEROBIC Blood Culture adequate volume   Culture      NO GROWTH < 24 HOURS Performed at [facility], [address], [place], [place] [address]    Report Status PENDING   Culture, blood (routine x 2)     Status: None (Preliminary result)   Collection Time: 09/28/17  7:15 PM  Result Value Ref Range   Specimen Description BLOOD RIGHT HAND    Special Requests      BOTTLES DRAWN AEROBIC ONLY Blood Culture adequate volume   Culture      NO GROWTH < 24 HOURS Performed at [facility], [address] [place], [place] [address]    Report Status PENDING   CBG monitoring, ED     Status: Abnormal   Collection Time: 09/28/17 11:50 PM  Result Value Ref Range   Glucose-Capillary 102 (H) 65 - 99 mg/dL  Troponin I (q 6hr x 3)     Status: Abnormal   Collection Time: 09/29/17 12:44 AM  Result Value Ref Range   Troponin I 0.03 (HH) <0.03 ng/mL  APTT     Status: Abnormal   Collection Time: 09/29/17 12:44 AM  Result Value Ref Range   aPTT 47 (H) 24 - 36 seconds  Heparin level (unfractionated)     Status: Abnormal   Collection Time: 09/29/17 12:44 AM  Result Value Ref Range   Heparin Unfractionated >2.20 (H) 0.30 - 0.70 IU/mL  Glucose, capillary     Status: None   Collection Time: 09/29/17  7:42 AM  Result Value Ref Range   Glucose-Capillary 90 65 - 99 mg/dL    Comment 1 Notify RN   APTT     Status: Abnormal   Collection Time: 09/29/17 11:51 AM  Result Value Ref Range   aPTT 67 (H) 24 - 36 seconds  Glucose, capillary     Status: Abnormal   Collection Time: 09/29/17 12:01 PM  Result Value Ref Range   Glucose-Capillary 55 (L) 65 - 99 mg/dL   Comment 1 Notify RN   Glucose, capillary     Status: Abnormal   Collection Time: 09/29/17 12:45 PM  Result Value Ref Range   Glucose-Capillary 106 (H) 65 - 99 mg/dL   Comment 1 Notify RN      Ct Abdomen Pelvis Wo Contrast  Result Date: 09/28/2017 CLINICAL DATA:  Patient with nausea, vomiting and diarrhea. Prior partial gallbladder removal 09/22/2017. EXAM: CT ABDOMEN AND PELVIS WITHOUT CONTRAST TECHNIQUE: Multidetector CT imaging of the abdomen and pelvis was performed following the standard protocol without IV contrast. COMPARISON:  CT abdomen pelvis 09/17/2017. FINDINGS: Lower chest: Subpleural consolidation within the right lower lobe. Small right pleural effusion. Trace left pleural effusion. Hepatobiliary: Liver is normal in size and contour. Postsurgical changes compatible with partial colectomy. Surgical drain terminates along the hepatic margin. No fluid collection is identified. Mild common bile duct dilatation measuring 11 mm. There is a 9 mm stone within the distal common bile  duct (image 29; series 2). Pancreas: Unremarkable Spleen: Unremarkable Adrenals/Urinary Tract: Adrenal glands are normal. Kidneys are symmetric in size. No hydronephrosis. Small fluid attenuation structure inferior pole right kidney. Urinary bladder is unremarkable. Stomach/Bowel: Extensive descending and sigmoid colonic diverticulosis. No CT evidence for acute diverticulitis. The appendix is normal. No evidence for bowel obstruction. No significant free fluid or free intraperitoneal air. Normal morphology of the stomach. Vascular/Lymphatic: Normal caliber abdominal aorta. Peripheral calcified atherosclerotic plaque. No retroperitoneal  lymphadenopathy. Reproductive: Prostate is mildly enlarged. Other: Small bilateral fat containing inguinal hernias. Musculoskeletal: No aggressive or acute appearing osseous lesions. Lumbar spine degenerative changes. IMPRESSION: 1. There is a 9 mm stone within the distal common bile duct resulting in mild common bile duct dilatation. 2. Postsurgical changes compatible with cholecystectomy. Drain catheter courses along the hepatic margin. No fluid collection is identified. 3. Consolidation within the right lower lobe and adjacent small right pleural effusion. Findings may represent atelectasis or infection. Electronically Signed   By: Annia Belt M.D.   On: 09/28/2017 19:15   Dg Chest 2 View  Result Date: 09/28/2017 CLINICAL DATA:  Nausea, vomiting and diarrhea. EXAM: CHEST  2 VIEW COMPARISON:  Chest radiograph 09/17/2017 FINDINGS: Single lead AICD device overlies the left hemithorax. Stable cardiomegaly status post median sternotomy. Elevation right hemidiaphragm. Heterogeneous opacities right lung base. Small right pleural effusion. No pneumothorax. IMPRESSION: Heterogeneous opacities right lung base may represent atelectasis or infection. Small right pleural effusion. Electronically Signed   By: Annia Belt M.D.   On: 09/28/2017 19:24    ROS:  As stated above in the HPI otherwise negative.  Blood pressure (!) 127/59, pulse (!) 55, temperature 98.2 F (36.8 C), temperature source Oral, resp. rate 20, height 6\' 5"  (1.956 m), weight 108.8 kg (239 lb 13.8 oz), SpO2 94 %.    PE: Gen: NAD, Alert and Oriented HEENT:  [place]/AT, EOMI Neck: Supple, no LAD Lungs: CTA Bilaterally CV: RRR without M/G/R ABM: Soft, NTND, +BS Ext: No C/C/E  Assessment/Plan: 1) Choledocholithiasis. 2) Ascending cholangitis. 3) Severe cardiac comorbidities.   He is hemodynamically stable with the 9 mm CBD stone.  Further treatment is required with an ERCP for stone extraction.  Plan: 1) ERCP with stone extraction or  possible stent placement.  Nariah Morgano D 09/29/2017, 1:47 PM

## 2017-09-29 NOTE — Care Management Note (Signed)
Case Management Note  Patient Details  Name: Wayne Gordon MRN: [id number] Date of Birth: [birth year]  Subjective/Objective:                    Action/Plan:  Patient from home with wife. Prior to admission was active with [facility]. Has walker at home.  Will need new orders and face to face to resume home health RN and PT Expected Discharge Date:                  Expected Discharge Plan:  Home w Home Health Services  In-House Referral:     Discharge planning Services  CM Consult  Post Acute Care Choice:  Home Health Choice offered to:  Patient  DME Arranged:    DME Agency:     HH Arranged:    HH Agency:     Status of Service:  In process, will continue to follow  If discussed at Long Length of Stay Meetings, dates discussed:    Additional Comments:  Kingsley Plan, RN 09/29/2017, 8:45 AM

## 2017-09-29 NOTE — Progress Notes (Signed)
Patient arrived to [phone] from [facility] ER via care link. Alert and oriented x4. Denies pain. Vital signs stable admitting md paged.

## 2017-09-29 NOTE — Anesthesia Preprocedure Evaluation (Addendum)
Anesthesia Evaluation  Patient identified by MRN, date of birth, ID band Patient awake    Reviewed: Allergy & Precautions, H&P , NPO status , Patient's Chart, lab work & pertinent test results  Airway Mallampati: II   Neck ROM: full    Dental  (+) Poor Dentition, Missing   Pulmonary neg pulmonary ROS,    breath sounds clear to auscultation       Cardiovascular hypertension, Pt. on home beta blockers + CAD, + Past MI, + CABG, + Peripheral Vascular Disease and +CHF  + dysrhythmias Atrial Fibrillation + pacemaker + Cardiac Defibrillator  Rhythm:regular Rate:Normal  ECG: A-fb, flutter, v-paced, rate 55  ECHO: LV EF: 20% - 25%  Sees cardiologist  S/p MVR.  Pt has complete heart block and is pacer dependent.   Neuro/Psych negative neurological ROS  negative psych ROS   GI/Hepatic GERD  Medicated and Controlled,  Endo/Other  diabetes, Type 2, Oral Hypoglycemic Agents, Insulin Dependent  Renal/GU CRFRenal disease     Musculoskeletal  (+) Arthritis ,   Abdominal   Peds  Hematology Hyperlipidemia   Anesthesia Other Findings Choledocholithiasis and possible ascending cholangitis  Reproductive/Obstetrics                           Anesthesia Physical  Anesthesia Plan  ASA: IV  Anesthesia Plan: General   Post-op Pain Management:    Induction: Intravenous  PONV Risk Score and Plan: 3 and Ondansetron, Dexamethasone and Treatment may vary due to age or medical condition  Airway Management Planned: Oral ETT  Additional Equipment:   Intra-op Plan:   Post-operative Plan: Extubation in OR  Informed Consent: I have reviewed the patients History and Physical, chart, labs and discussed the procedure including the risks, benefits and alternatives for the proposed anesthesia with the patient or authorized representative who has indicated his/her understanding and acceptance.   Dental advisory  given  Plan Discussed with: CRNA  Anesthesia Plan Comments:        Anesthesia Quick Evaluation

## 2017-09-29 NOTE — Progress Notes (Signed)
Hypoglycemic Event  CBG: 55  Treatment: D50 IV 25 mL   Symptoms: Pale   Follow-up CBG: Time:1245 CBG Result:106  Possible Reasons for Event: Inadequate meal intake. Patient is NPO and has NS as maintenance fluids.   Comments/MD notified: Notified Dr. David Stall. Maintenance fluids switched to D5 .45 NS.   Carroll Kinds, RN

## 2017-09-29 NOTE — Transfer of Care (Signed)
Immediate Anesthesia Transfer of Care Note  Patient: Wayne Gordon  Procedure(s) Performed: ENDOSCOPIC RETROGRADE CHOLANGIOPANCREATOGRAPHY (ERCP) (N/A )  Patient Location: Endoscopy Unit  Anesthesia Type:General  Level of Consciousness: awake, alert  and oriented  Airway & Oxygen Therapy: Patient Spontanous Breathing and Patient connected to nasal cannula oxygen  Post-op Assessment: Report given to RN and Post -op Vital signs reviewed and stable  Post vital signs: Reviewed and stable  Last Vitals:  Vitals:   09/29/17 0528 09/29/17 1255  BP: 121/61 (!) 127/59  Pulse: (!) 55 (!) 55  Resp: 17 20  Temp: 36.9 C 36.8 C  SpO2: 96% 94%    Last Pain:  Vitals:   09/29/17 1255  TempSrc: Oral  PainSc:          Complications: No apparent anesthesia complications

## 2017-09-29 NOTE — Progress Notes (Signed)
ANTICOAGULATION CONSULT NOTE - Preliminary  Pharmacy Consult for Heparin Indication: atrial fibrillation   Patient Measurements: Height: 6\' 5"  (195.6 cm) Weight: 237 lb (107.5 kg) IBW/kg (Calculated) : 89.1 HEPARIN DW (KG): 107.5   Vital Signs: Temp: 99.5 F (37.5 C) (01/31 1829) Temp Source: Rectal (01/31 1829) BP: 103/51 (02/01 0200) Pulse Rate: 55 (02/01 0200)  Labs: Recent Labs    09/28/17 1914 09/29/17 0044  HGB 13.3  --   HCT 40.6  --   PLT 223  --   APTT  --  47*  HEPARINUNFRC  --  >2.20*  CREATININE 2.45*  --   TROPONINI 0.03* 0.03*   Estimated Creatinine Clearance: 32.3 mL/min (A) (by C-G formula based on SCr of 2.45 mg/dL (H)).  Medical History: Past Medical History:  Diagnosis Date  . Allergy to ACE inhibitors    Angioedema many years ago; patient has tolerated Losartan (ARB) in the past - it was held during [facility] for GI bleed and worsening renal function >> resume Losartan 25 mg QD 06/2015  . Arthritis   . Atrial fibrillation (HCC)    PAF, CHADs2Vasc = 5  . Carotid artery disease (HCC)    60-79% LICA  . CHF (congestive heart failure) (HCC)   . Coronary artery disease   . Diabetes mellitus   . GERD (gastroesophageal reflux disease)   . Hyperlipidemia   . Hypertension   . ICD (implantable cardiac defibrillator) in place 03/28/2012   B[place]ron[place], Dr. Ladona Ridgel 03/28/12  . Ischemic cardiomyopathy    S/P CABG x 3; EF 25%  . Mitral regurgitation    S/P mitral valve repair 2013  . Myocardial infarction (HCC)   . Renal insufficiency     Medications:  Scheduled:  . carvedilol  12.5 mg Oral BID  . insulin aspart  0-15 Units Subcutaneous TID WC  . insulin aspart  0-5 Units Subcutaneous QHS  . pantoprazole  40 mg Oral Daily    Assessment: 84 yr old male on apixaban for afib to start heparin drip due to upcoming potential surgery.  Patient took his last dose of apixaban at 1630 on 09/28/17.   Goal of Therapy:  aPTT 66-102 seconds   Plan:  Start  heparin infusion at 1300 units/hr, no bolus, at 0430. [name] Kitchen Check aPTT 8 hours after start of infusion.  Preliminary review of pertinent patient information completed.  Jeani Hawking clinical pharmacist will complete review during morning rounds to assess the patient and finalize treatment regimen.  Wendie Simmer, PharmD, BCPS Clinical Pharmacist

## 2017-09-29 NOTE — H&P (View-Only) (Signed)
[facility] PROGRESS NOTE    Progress Note  Wayne Gordon  ZOX:[id number] DOB: Mar 31, 1934 DOA: 09/28/2017 PCP: Samuel Jester, DO     Brief Narrative:   Wayne Gordon is an 84 y.o. male past medical history coronary artery disease, chronic systolic heart failure, atrial fibrillation on Eliquis, recent cholecystectomy with drain in place due to cholangitis and history of nausea and vomiting, CT scan showed a 9 mm stone lodged in the common bile  Assessment/Plan:   Sepsis /acute Cholangitis With a CT scan done on admission showed 9 mm stone, admitted to the hospital started on IV fluids and antibiotics with Flagyl. Blood cultures were done. Cont IV heparin. General surgery GI has been consulted and awaiting recommendations. Blood cultures from last admission showed Klebsiella sensitive to Rocephin  Acute kidney injury on CKD III: Creatinine on 09/22/2017 was 1.8 on admission 2.5 in the setting of sepsis. PRe-renal, started on IV fluids. Recheck in the am.  DM 2: Keep NPO start SSI.  Elevated troponins: Setting of CAD, he has remained asymptomatic cardiac biomarkers have, setting of sepsis and chronic renal disease  HTN (hypertension) Hold anti-hypertesive medications. Use hydralazine IV PRN for SBP > 180.  Automatic implantable cardioverter-defibrillator in situ  Chronic atrial fibrillation (HCC) Rate controlled, on IV heparin.   DVT prophylaxis: Heparin Family Communication:none Disposition Plan/Barrier to D/C: unable to determine Code Status:     Code Status Orders  (From admission, onward)        Start     Ordered   09/28/17 2307  Full code  Continuous     09/28/17 2309    Code Status History    Date Active Date Inactive Code Status Order ID Comments User Context   09/17/2017 17:14 09/22/2017 20:13 Full Code [id number]  Cleora Fleet, MD Inpatient   06/23/2016 19:27 06/25/2016 14:21 Full Code [id number]  Standley Brooking, MD Inpatient   07/10/2015 16:05 07/11/2015 19:17 Full Code [id number]  Sheilah Pigeon, PA-C Inpatient   09/02/2014 00:16 09/02/2014 20:08 Full Code [id number]  Pearson Grippe, MD Inpatient   02/03/2014 21:39 02/05/2014 20:01 Full Code [id number]  Eduard Clos, MD Inpatient   10/19/2011 08:26 10/24/2011 18:57 Full Code [id number]  Purcell Nails, MD Inpatient   10/17/2011 14:56 10/19/2011 08:26 Full Code [id number]  Jana Hakim, RN Inpatient    Advance Directive Documentation     Most Recent Value  Type of Advance Directive  Living will  Pre-existing out of facility DNR order (yellow form or pink MOST form)  No data  "MOST" Form in Place?  No data        IV Access:    Peripheral IV   Procedures and diagnostic studies:   Ct Abdomen Pelvis Wo Contrast  Result Date: 09/28/2017 CLINICAL DATA:  Patient with nausea, vomiting and diarrhea. Prior partial gallbladder removal 09/22/2017. EXAM: CT ABDOMEN AND PELVIS WITHOUT CONTRAST TECHNIQUE: Multidetector CT imaging of the abdomen and pelvis was performed following the standard protocol without IV contrast. COMPARISON:  CT abdomen pelvis 09/17/2017. FINDINGS: Lower chest: Subpleural consolidation within the right lower lobe. Small right pleural effusion. Trace left pleural effusion. Hepatobiliary: Liver is normal in size and contour. Postsurgical changes compatible with partial colectomy. Surgical drain terminates along the hepatic margin. No fluid collection is identified. Mild common bile duct dilatation measuring 11 mm. There is a 9 mm stone within the distal common bile duct (image 29; series 2). Pancreas: Unremarkable Spleen: Unremarkable Adrenals/Urinary Tract: Adrenal glands are normal.  Kidneys are symmetric in size. No hydronephrosis. Small fluid attenuation structure inferior pole right kidney. Urinary bladder is unremarkable. Stomach/Bowel: Extensive descending and sigmoid colonic diverticulosis. No CT evidence for acute diverticulitis. The appendix is  normal. No evidence for bowel obstruction. No significant free fluid or free intraperitoneal air. Normal morphology of the stomach. Vascular/Lymphatic: Normal caliber abdominal aorta. Peripheral calcified atherosclerotic plaque. No retroperitoneal lymphadenopathy. Reproductive: Prostate is mildly enlarged. Other: Small bilateral fat containing inguinal hernias. Musculoskeletal: No aggressive or acute appearing osseous lesions. Lumbar spine degenerative changes. IMPRESSION: 1. There is a 9 mm stone within the distal common bile duct resulting in mild common bile duct dilatation. 2. Postsurgical changes compatible with cholecystectomy. Drain catheter courses along the hepatic margin. No fluid collection is identified. 3. Consolidation within the right lower lobe and adjacent small right pleural effusion. Findings may represent atelectasis or infection. Electronically Signed   By: Annia Belt M.D.   On: 09/28/2017 19:15   Dg Chest 2 View  Result Date: 09/28/2017 CLINICAL DATA:  Nausea, vomiting and diarrhea. EXAM: CHEST  2 VIEW COMPARISON:  Chest radiograph 09/17/2017 FINDINGS: Single lead AICD device overlies the left hemithorax. Stable cardiomegaly status post median sternotomy. Elevation right hemidiaphragm. Heterogeneous opacities right lung base. Small right pleural effusion. No pneumothorax. IMPRESSION: Heterogeneous opacities right lung base may represent atelectasis or infection. Small right pleural effusion. Electronically Signed   By: Annia Belt M.D.   On: 09/28/2017 19:24     Medical Consultants:    None.  Anti-Infectives:   IV rocephin and flagyl  Subjective:    Wayne Gordon patient relates his pain is controlled no nausea.  Objective:    Vitals:   09/29/17 0330 09/29/17 0400 09/29/17 0430 09/29/17 0528  BP: (!) 115/58 (!) 109/55 (!) 119/57 121/61  Pulse: (!) 54 (!) 53 (!) 55 (!) 55  Resp: 18 18 (!) 21 17  Temp:    98.4 F (36.9 C)  TempSrc:    Oral  SpO2: 97% 93% 95% 96%   Weight:    108.8 kg (239 lb 13.8 oz)  Height:    6\' 5"  (1.956 m)    Intake/Output Summary (Last 24 hours) at 09/29/2017 0945 Last data filed at 09/29/2017 0743 Gross per 24 hour  Intake 703.75 ml  Output 480 ml  Net 223.75 ml   Filed Weights   09/28/17 1750 09/29/17 0528  Weight: 107.5 kg (237 lb) 108.8 kg (239 lb 13.8 oz)    Exam: General exam: In no acute distress. Respiratory system: Good air movement and clear to auscultation. Cardiovascular system: S1 & S2 heard, RRR Gastrointestinal system: Abdomen is nondistended, soft and nontender. Drain in placed.  Central nervous system: Alert and oriented. No focal neurological deficits. Extremities: No pedal edema. Skin: No rashes, lesions or ulcers Psychiatry: Judgement and insight appear normal. Mood & affect appropriate.    Data Reviewed:    Labs: Basic Metabolic Panel: Recent Labs  Lab 09/28/17 1914  NA 130*  K 5.0  CL 96*  CO2 25  GLUCOSE 148*  BUN 48*  CREATININE 2.45*  CALCIUM 8.8*   GFR Estimated Creatinine Clearance: 32.4 mL/min (A) (by C-G formula based on SCr of 2.45 mg/dL (H)). Liver Function Tests: Recent Labs  Lab 09/28/17 1914  AST 55*  ALT 110*  ALKPHOS 211*  BILITOT 2.4*  PROT 6.4*  ALBUMIN 2.9*   Recent Labs  Lab 09/28/17 1914  LIPASE 41   No results for input(s): AMMONIA in the last 168 hours.  Coagulation profile No results for input(s): INR, PROTIME in the last 168 hours.  CBC: Recent Labs  Lab 09/28/17 1914  WBC 19.6*  NEUTROABS 17.2*  HGB 13.3  HCT 40.6  MCV 95.8  PLT 223   Cardiac Enzymes: Recent Labs  Lab 09/28/17 1914 09/29/17 0044  TROPONINI 0.03* 0.03*   BNP (last 3 results) No results for input(s): PROBNP in the last 8760 hours. CBG: Recent Labs  Lab 09/22/17 1144 09/28/17 2350 09/29/17 0742  GLUCAP 174* 102* 90   D-Dimer: No results for input(s): DDIMER in the last 72 hours. Hgb A1c: No results for input(s): HGBA1C in the last 72 hours. Lipid  Profile: No results for input(s): CHOL, HDL, LDLCALC, TRIG, CHOLHDL, LDLDIRECT in the last 72 hours. Thyroid function studies: No results for input(s): TSH, T4TOTAL, T3FREE, THYROIDAB in the last 72 hours.  Invalid input(s): FREET3 Anemia work up: No results for input(s): VITAMINB12, FOLATE, FERRITIN, TIBC, IRON, RETICCTPCT in the last 72 hours. Sepsis Labs: Recent Labs  Lab 09/28/17 1914  WBC 19.6*  LATICACIDVEN 1.3   Microbiology Recent Results (from the past 240 hour(s))  Culture, blood (routine x 2)     Status: None (Preliminary result)   Collection Time: 09/28/17  7:14 PM  Result Value Ref Range Status   Specimen Description RIGHT ANTECUBITAL  Final   Special Requests   Final    BOTTLES DRAWN AEROBIC AND ANAEROBIC Blood Culture adequate volume   Culture PENDING  Incomplete   Report Status PENDING  Incomplete  Culture, blood (routine x 2)     Status: None (Preliminary result)   Collection Time: 09/28/17  7:15 PM  Result Value Ref Range Status   Specimen Description BLOOD RIGHT HAND  Final   Special Requests   Final    BOTTLES DRAWN AEROBIC ONLY Blood Culture adequate volume   Culture PENDING  Incomplete   Report Status PENDING  Incomplete     Medications:   . carvedilol  12.5 mg Oral BID  . insulin aspart  0-15 Units Subcutaneous TID WC  . insulin aspart  0-5 Units Subcutaneous QHS  . pantoprazole  40 mg Oral Daily   Continuous Infusions: . sodium chloride 75 mL/hr at 09/28/17 2345  . cefTRIAXone (ROCEPHIN)  IV Stopped (09/29/17 0014)  . heparin 1,300 Units/hr (09/29/17 0418)  . metronidazole Stopped (09/29/17 0848)     LOS: 1 day   Marinda Elk  [facility] Pager [phone]  *Please refer to [email or web address], password [email or web address] to get updated schedule on who will round on this patient, as hospitalists switch teams weekly. If 7PM-7AM, please contact night-coverage at [email or web address], password [email or web address] for any overnight needs.  09/29/2017, 9:45 AM

## 2017-09-29 NOTE — Op Note (Signed)
[facility] Patient Name: Wayne Gordon Procedure Date : 09/29/2017 MRN: [id number] Attending MD: Jeani Hawking , MD Date of Birth: 1934/08/25 CSN: [id number] Age: 84 Admit Type: Inpatient Procedure:                ERCP Indications:              For therapy of bile duct stone(s) Providers:                Jeani Hawking, MDAutumn Quincy Carnes, RN, , Arlee Muslim Tech., Technician, Elliot Dally CRNA, CRNA Referring MD:              Medicines:                General Anesthesia Complications:            No immediate complications. Estimated Blood Loss:     Estimated blood loss was minimal. Procedure:                Pre-Anesthesia Assessment:                           - Prior to the procedure, a History and Physical                            was performed, and patient medications and                            allergies were reviewed. The patient is competent.                            The risks and benefits of the procedure and the                            sedation options and risks were discussed with the                            patient. All questions were answered and informed                            consent was obtained. Patient identification and                            proposed procedure were verified by the physician.                            Mental Status Examination: alert and oriented.                            Airway Examination: normal oropharyngeal airway and                            neck mobility. Respiratory Examination: clear to  auscultation. CV Examination: normal. Prophylactic                            Antibiotics: The patient does not require                            prophylactic antibiotics. [Anticoagulant Agents]                            [YOMA Prior to Procedure]. [ASA Grade]. After                            reviewing the risks and benefits, the patient was      deemed in satisfactory condition to undergo the                            procedure. The anesthesia plan was to use moderate                            sedation / analgesia (conscious sedation).                            Immediately prior to administration of medications,                            the patient was re-assessed for adequacy to receive                            sedatives. The heart rate, respiratory rate, oxygen                            saturations, blood pressure, adequacy of pulmonary                            ventilation, and response to care were monitored                            throughout the procedure. The physical status of                            the patient was re-assessed after the procedure.                           - Prior to the procedure, a History and Physical                            was performed, and patient medications and                            allergies were reviewed. The patient's tolerance of                            previous anesthesia was also reviewed. The risks  and benefits of the procedure and the sedation                            options and risks were discussed with the patient.                            All questions were answered, and informed consent                            was obtained. Prior Anticoagulants: The patient has                            taken no previous anticoagulant or antiplatelet                            agents. ASA Grade Assessment: III - A patient with                            severe systemic disease. After reviewing the risks                            and benefits, the patient was deemed in                            satisfactory condition to undergo the procedure.                           - Sedation was administered by an anesthesia                            professional. General anesthesia was attained.                           After obtaining informed  consent, the scope was                            passed under direct vision. Throughout the                            procedure, the patient's blood pressure, pulse, and                            oxygen saturations were monitored continuously. The                            [id number] ([id number]) Pentax duodenoscope was                            introduced through the mouth, and used to inject                            contrast into and used to inject contrast into the  bile duct. The ERCP was accomplished without                            difficulty. The patient tolerated the procedure                            well. The ERCP was accomplished with ease. The                            patient tolerated the procedure well. Scope In: Scope Out: Findings:      The esophagus was successfully intubated under direct vision. The scope       was advanced to a normal major papilla in the descending duodenum       without detailed examination of the pharynx, larynx and associated       structures, and upper GI tract. The upper GI tract was grossly normal.       The bile duct was deeply cannulated [Device]. Contrast was injected.       [Image Interpretation]. [Radiology Details]. Opacification of the entire       biliary tree except for the gallbladder was successful. The [Site]       contained [Number] [Size of largest] mm. The [Site] was [Extent]       [Etiology]. [Size]. A cholecystectomy had been performed. [Wire type]       was passed into the biliary tree. [Length] biliary sphincterotomy was       made with a [Wire] [Sphincterotome] [Current]. [Post-sphinct bleeding].       The biliary tree was swept [Devices used] starting at the bifurcation.       All stones were removed. No stones remained. The major papilla was       normal. The bile duct was deeply cannulated with the short-nosed       traction sphincterotome. Contrast was injected. Opacification of the        main bile duct was successful. The maximum diameter of the ducts was 10       mm. The middle third of the main bile duct contained one stone, which       was 10 mm in diameter. A short 0.035 inch Soft Jagwire was passed into       the biliary tree. A 10 mm biliary sphincterotomy was made with a       monofilament traction (standard) sphincterotome using ERBE       electrocautery. The sphincterotomy oozed blood. The biliary tree was       swept with a 12 mm balloon starting at the bifurcation. One stone was       removed. No stones remained.      The CBD was easily cannulated during the first attempt. The guidewirewas       was secured in the right intrahepatic ducts. There was mild oozing of       blood with the sphincterotomy, which stopped with prolonged observation.       The stone removed was large and elongated. Impression:               - The major papilla appeared normal.                           - The biliary system was dilated.                           -  The patient has had a cholecystectomy.                           - Choledocholithiasis was found. Complete removal                            was accomplished by biliary sphincterotomy and                            balloon extraction.                           - A biliary sphincterotomy was performed.                           - The biliary tree was swept. Recommendation:           - Return patient to hospital ward for ongoing care.                           - Resume regular diet.                           - Continue present medications.                           - Hold or minimize anticoagulation for the next 3                            days. Procedure Code(s):        --- Professional ---                           [phone], Endoscopic retrograde                            cholangiopancreatography (ERCP); with removal of                            calculi/debris from biliary/pancreatic duct(s)                           43262,  Endoscopic retrograde                            cholangiopancreatography (ERCP); with                            sphincterotomy/papillotomy Diagnosis Code(s):        --- Professional ---                           K80.50, Calculus of bile duct without cholangitis                            or cholecystitis without obstruction  Z90.49, Acquired absence of other specified parts                            of digestive tract                           K83.8, Other specified diseases of biliary tract CPT copyright 2016 [employer]. All rights reserved. The codes documented in this report are preliminary and upon coder review may  be revised to meet current compliance requirements. Jeani Hawking, MD Jeani Hawking, MD 09/29/2017 2:28:03 PM This report has been signed electronically. Number of Addenda: 0

## 2017-09-29 NOTE — Progress Notes (Signed)
1651 Dr. Elnoria Howard called and stated he would like to hold Heparin if possible, if not would like to minimize it if possible. Dr. David Stall notified.

## 2017-09-30 DIAGNOSIS — K8071 Calculus of gallbladder and bile duct without cholecystitis with obstruction: Secondary | ICD-10-CM

## 2017-09-30 LAB — URINE CULTURE: Culture: NO GROWTH

## 2017-09-30 LAB — HEPATIC FUNCTION PANEL
ALK PHOS: 376 U/L — AB (ref 38–126)
ALT: 93 U/L — ABNORMAL HIGH (ref 17–63)
AST: 59 U/L — ABNORMAL HIGH (ref 15–41)
Albumin: 2.3 g/dL — ABNORMAL LOW (ref 3.5–5.0)
BILIRUBIN INDIRECT: 0.7 mg/dL (ref 0.3–0.9)
Bilirubin, Direct: 0.6 mg/dL — ABNORMAL HIGH (ref 0.1–0.5)
TOTAL PROTEIN: 5.8 g/dL — AB (ref 6.5–8.1)
Total Bilirubin: 1.3 mg/dL — ABNORMAL HIGH (ref 0.3–1.2)

## 2017-09-30 LAB — GLUCOSE, CAPILLARY
GLUCOSE-CAPILLARY: 273 mg/dL — AB (ref 65–99)
GLUCOSE-CAPILLARY: 276 mg/dL — AB (ref 65–99)
GLUCOSE-CAPILLARY: 321 mg/dL — AB (ref 65–99)
Glucose-Capillary: 255 mg/dL — ABNORMAL HIGH (ref 65–99)
Glucose-Capillary: 275 mg/dL — ABNORMAL HIGH (ref 65–99)
Glucose-Capillary: 298 mg/dL — ABNORMAL HIGH (ref 65–99)
Glucose-Capillary: 311 mg/dL — ABNORMAL HIGH (ref 65–99)

## 2017-09-30 LAB — BASIC METABOLIC PANEL
ANION GAP: 10 (ref 5–15)
BUN: 49 mg/dL — ABNORMAL HIGH (ref 6–20)
CALCIUM: 8.2 mg/dL — AB (ref 8.9–10.3)
CO2: 22 mmol/L (ref 22–32)
CREATININE: 2.22 mg/dL — AB (ref 0.61–1.24)
Chloride: 99 mmol/L — ABNORMAL LOW (ref 101–111)
GFR calc non Af Amer: 26 mL/min — ABNORMAL LOW (ref >60)
GFR, EST AFRICAN AMERICAN: 30 mL/min — AB (ref >60)
Glucose, Bld: 289 mg/dL — ABNORMAL HIGH (ref 65–99)
Potassium: 4.9 mmol/L (ref 3.5–5.1)
SODIUM: 131 mmol/L — AB (ref 135–145)

## 2017-09-30 LAB — CBC
HCT: 37.2 % — ABNORMAL LOW (ref 39.0–52.0)
Hemoglobin: 12.6 g/dL — ABNORMAL LOW (ref 13.0–17.0)
MCH: 31.7 pg (ref 26.0–34.0)
MCHC: 33.9 g/dL (ref 30.0–36.0)
MCV: 93.7 fL (ref 78.0–100.0)
PLATELETS: 266 10*3/uL (ref 150–400)
RBC: 3.97 MIL/uL — AB (ref 4.22–5.81)
RDW: 13.8 % (ref 11.5–15.5)
WBC: 7.5 10*3/uL (ref 4.0–10.5)

## 2017-09-30 LAB — HEMOGLOBIN A1C
HEMOGLOBIN A1C: 7.8 % — AB (ref 4.8–5.6)
MEAN PLASMA GLUCOSE: 177.16 mg/dL

## 2017-09-30 MED ORDER — SODIUM CHLORIDE 0.9 % IV SOLN
INTRAVENOUS | Status: DC
  Administered 2017-09-30 – 2017-10-01 (×2): via INTRAVENOUS

## 2017-09-30 MED ORDER — INSULIN ASPART 100 UNIT/ML ~~LOC~~ SOLN
0.0000 [IU] | Freq: Every day | SUBCUTANEOUS | Status: DC
  Administered 2017-09-30: 4 [IU] via SUBCUTANEOUS

## 2017-09-30 MED ORDER — INSULIN GLARGINE 100 UNIT/ML ~~LOC~~ SOLN
6.0000 [IU] | Freq: Every day | SUBCUTANEOUS | Status: DC
  Administered 2017-09-30: 6 [IU] via SUBCUTANEOUS
  Filled 2017-09-30: qty 0.06

## 2017-09-30 MED ORDER — INSULIN ASPART 100 UNIT/ML ~~LOC~~ SOLN
0.0000 [IU] | Freq: Three times a day (TID) | SUBCUTANEOUS | Status: DC
  Administered 2017-09-30 (×2): 5 [IU] via SUBCUTANEOUS
  Administered 2017-10-01: 2 [IU] via SUBCUTANEOUS

## 2017-09-30 NOTE — Progress Notes (Signed)
[facility]                                                                              Patient Demographics  Wayne Gordon, is a 84 y.o. male, DOB - Nov 14, 1933, KLK:[id number]  Admit date - 09/28/2017   Admitting Physician [place], DO  Outpatient Primary MD for the patient is Octavio Graves, DO  Outpatient specialists:   LOS - 2  days   Medical records reviewed and are as summarized below:    Chief Complaint  Patient presents with  . post surgery  . Emesis  . Diarrhea  . Hypotension       Brief summary    Wayne Gordon is an 84 y.o. male past medical history coronary artery disease, chronic systolic heart failure, atrial fibrillation on Eliquis, recent cholecystectomy with drain in place due to cholangitis.  Patient presented with nausea and an episode of vomiting along with diarrhea on the day of admission, reported chills for the last 2 days with a decreased urine output and soft BP.  CT scan showed a 9 mm stone lodged in the common bile.  Patient was admitted for sepsis with a likely acute cholangitis.  Assessment & Plan    Principal Problem: Acute cholangitis with CBD obstruction secondary to 9 mm gallstone in the setting of recent subtotal lap chole with drain placement - Patient met sepsis criteria at the time of admission with hypotension, low-grade temp, cholangitis -Patient was placed on n.p.o. status, IV fluids, IV Rocephin and Flagyl. -Eliquis was placed on hold, GI was consulted. -Patient underwent ERCP on 2/1 which showed choledocholithiasis, complete removal was accomplished by biliary sphincterotomy and balloon extraction -Patient feels a lot better today, LFTs still up likely due to ERCP, diet advanced and tolerating -Increase ambulation, PT OT evaluation -Further recommendations per GI   Active Problems:   Acute renal failure superimposed on stage 3 chronic kidney disease (HCC) -Creatinine 2.45 at the time of admission, was 1.8  on 1/25 when patient was discharged from previous admission -Continue IV fluid hydration, creatinine trending to 2.2, monitor cautiously for any volume overload -Due to hypoglycemia, change the fluids to normal saline for 2 L only then saline due to patient's history of CHF.  Diabetes mellitus type 2 -Patient had hypoglycemia episode on 2/1 however was n.p.o. at that time -Now hyperglycemic with CBG in 200- 300s, discontinue D5 drip, change diet to carb modified diet -Restarted Lantus at half dose 6 units, continue sensitive sliding scale insulin   Mildly elevated troponins -In the setting of sepsis, chronic renal disease, acute cholangitis, has underlying history of CAD.  -No chest pain.  Hypertension -BP currently stable, continue hydralazine as needed  Chronic atrial fibrillation Rate controlled, per GI hold anticoagulation for now   Code Status: full code  DVT Prophylaxis:  SCD's eliquis on hold  Family Communication: Discussed in detail with the patient, all imaging results, lab results explained to the patient and wife   Disposition Plan: Hopefully DC home in a.m.  Patient had back-to-back admission and once another day to affirm that his creatinine is improving and he is medically  ready.  Time Spent in minutes 35 minutes  Procedures:  ERCP  Consultants:   Gastroenterology  Antimicrobials:      Medications  Scheduled Meds: . carvedilol  12.5 mg Oral BID  . insulin aspart  0-9 Units Subcutaneous Q4H  . pantoprazole  40 mg Oral Daily   Continuous Infusions: . sodium chloride    . cefTRIAXone (ROCEPHIN)  IV Stopped (09/30/17 0019)  . metronidazole Stopped (09/30/17 0708)   PRN Meds:.hydrALAZINE, morphine injection, ondansetron **OR** ondansetron (ZOFRAN) IV   Antibiotics   Anti-infectives (From admission, onward)   Start     Dose/Rate Route Frequency Ordered Stop   09/28/17 2330  cefTRIAXone (ROCEPHIN) 2 g in dextrose 5 % 50 mL IVPB     2 g 100 mL/hr over  30 Minutes Intravenous Every 24 hours 09/28/17 2309     09/28/17 2330  metroNIDAZOLE (FLAGYL) IVPB 500 mg     500 mg 100 mL/hr over 60 Minutes Intravenous Every 8 hours 09/28/17 2309          Subjective:   Wayne Gordon was seen and examined today.  Feels somewhat better today after ERCP.  No fevers or chills.  No significant abdominal pain this morning, nausea vomiting.  Feels a little generalized weakness.  Patient denies dizziness, chest pain, shortness of breath, abdominal pain, N/V/D/C. No acute events overnight.    Objective:   Vitals:   09/30/17 0211 09/30/17 0500 09/30/17 0523 09/30/17 0955  BP: 136/63  138/67 139/65  Pulse: (!) 55  (!) 58 (!) 55  Resp: '18  18 18  '$ Temp: (!) 97.4 F (36.3 C)  (!) 97.3 F (36.3 C) 97.6 F (36.4 C)  TempSrc: Oral  Oral Axillary  SpO2: 98%  99% 99%  Weight:  112 kg (246 lb 14.6 oz)    Height:        Intake/Output Summary (Last 24 hours) at 09/30/2017 1145 Last data filed at 09/30/2017 0857 Gross per 24 hour  Intake 1567.5 ml  Output 430 ml  Net 1137.5 ml     Wt Readings from Last 3 Encounters:  09/30/17 112 kg (246 lb 14.6 oz)  09/19/17 108 kg (238 lb)  02/02/17 109.3 kg (241 lb)     Exam  General: Alert and oriented x 3, NAD  Eyes:   HEENT:   Cardiovascular: S1 S2 clear, RRR  Respiratory: Clear to auscultation bilaterally, no wheezing, rales or rhonchi  Gastrointestinal: Soft, nontender, nondistended, + bowel sounds  Ext: no pedal edema bilaterally  Neuro: no new deficit  Musculoskeletal: No digital cyanosis, clubbing  Skin: No rashes  Psych: Normal affect and demeanor, alert and oriented x3    Data Reviewed:  I have personally reviewed following labs and imaging studies  Micro Results Recent Results (from the past 240 hour(s))  Urine culture     Status: None   Collection Time: 09/28/17  6:02 PM  Result Value Ref Range Status   Specimen Description   Final    URINE, CLEAN CATCH Performed at [facility], [address], [place], [place] [address]    Special Requests   Final    NONE Performed at [facility], [address], [place], [place] [address]    Culture   Final    NO GROWTH Performed at [facility], [place] [address], [place], [place] [address]    Report Status 09/30/2017 FINAL  Final  Culture, blood (routine x 2)     Status: None (Preliminary result)  Collection Time: 09/28/17  7:14 PM  Result Value Ref Range Status   Specimen Description RIGHT ANTECUBITAL  Final   Special Requests   Final    BOTTLES DRAWN AEROBIC AND ANAEROBIC Blood Culture adequate volume   Culture   Final    NO GROWTH 2 DAYS Performed at [facility], [address], [place], [place] [address]    Report Status PENDING  Incomplete  Culture, blood (routine x 2)     Status: None (Preliminary result)   Collection Time: 09/28/17  7:15 PM  Result Value Ref Range Status   Specimen Description BLOOD RIGHT HAND  Final   Special Requests   Final    BOTTLES DRAWN AEROBIC ONLY Blood Culture adequate volume   Culture   Final    NO GROWTH 2 DAYS Performed at [facility], [address], [place], [place] [address]    Report Status PENDING  Incomplete    Radiology Reports Ct Abdomen Pelvis Wo Contrast  Result Date: 09/28/2017 CLINICAL DATA:  Patient with nausea, vomiting and diarrhea. Prior partial gallbladder removal 09/22/2017. EXAM: CT ABDOMEN AND PELVIS WITHOUT CONTRAST TECHNIQUE: Multidetector CT imaging of the abdomen and pelvis was performed following the standard protocol without IV contrast. COMPARISON:  CT abdomen pelvis 09/17/2017. FINDINGS: Lower chest: Subpleural consolidation within the right lower lobe. Small right pleural effusion. Trace left pleural effusion. Hepatobiliary: Liver is normal in size and contour. Postsurgical changes compatible with partial colectomy. Surgical drain terminates along the hepatic margin. No fluid collection is identified. Mild common bile duct dilatation measuring  11 mm. There is a 9 mm stone within the distal common bile duct (image 29; series 2). Pancreas: Unremarkable Spleen: Unremarkable Adrenals/Urinary Tract: Adrenal glands are normal. Kidneys are symmetric in size. No hydronephrosis. Small fluid attenuation structure inferior pole right kidney. Urinary bladder is unremarkable. Stomach/Bowel: Extensive descending and sigmoid colonic diverticulosis. No CT evidence for acute diverticulitis. The appendix is normal. No evidence for bowel obstruction. No significant free fluid or free intraperitoneal air. Normal morphology of the stomach. Vascular/Lymphatic: Normal caliber abdominal aorta. Peripheral calcified atherosclerotic plaque. No retroperitoneal lymphadenopathy. Reproductive: Prostate is mildly enlarged. Other: Small bilateral fat containing inguinal hernias. Musculoskeletal: No aggressive or acute appearing osseous lesions. Lumbar spine degenerative changes. IMPRESSION: 1. There is a 9 mm stone within the distal common bile duct resulting in mild common bile duct dilatation. 2. Postsurgical changes compatible with cholecystectomy. Drain catheter courses along the hepatic margin. No fluid collection is identified. 3. Consolidation within the right lower lobe and adjacent small right pleural effusion. Findings may represent atelectasis or infection. Electronically Signed   By: Lovey Newcomer M.D.   On: 09/28/2017 19:15   Ct Abdomen Pelvis Wo Contrast  Result Date: 09/17/2017 CLINICAL DATA:  84 year old male with abdominal pain, nausea vomiting this morning. Difficulty urinating, dark colored urine. EXAM: CT ABDOMEN AND PELVIS WITHOUT CONTRAST TECHNIQUE: Multidetector CT imaging of the abdomen and pelvis was performed following the standard protocol without IV contrast. COMPARISON:  CT Abdomen and Pelvis 02/03/2014. FINDINGS: Lower chest: Cardiomegaly. Calcified coronary artery atherosclerosis. Prior sternotomy. No pericardial effusion. Chronic pulmonary architectural  distortion in the right lower lobe. Superimposed chronic elevation of the right hemidiaphragm, with right middle and lower lobe atelectasis. Superimposed other mild dependent pulmonary atelectasis. No pleural effusion. Hepatobiliary: Cholelithiasis (series 2, image 12). No pericholecystic inflammation. Negative visible noncontrast CT. Pancreas: Chronic pancreatic atrophy. Spleen: Negative. Adrenals/Urinary Tract: Normal adrenal glands. Chronic bilateral perinephric stranding which is nonspecific. No hydronephrosis. Both ureters are  decompressed. No urologic calculus identified. Decompressed and unremarkable urinary bladder. No perivesical stranding. Stomach/Bowel: Retained low-density stool in the rectum and distal sigmoid colon. Chronic sigmoid redundancy with severe diverticulosis throughout the mid and proximal sigmoid. Difficult to exclude mild inflammation at the junction of the descending and sigmoid colon (coronal image 47), although this is similar appearance to the 2015 CT. Severe diverticulosis continues in the left colon and throughout the transverse colon. No active inflammation. Oral contrast has reached the cecum which is on a lax mesentery in the anterior right abdomen. Negative cecum and appendix. Negative terminal ileum. No dilated small bowel. Moderately distended stomach with oral contrast. Negative duodenum. No abdominal free fluid or free air. Vascular/Lymphatic: Extensive Aortoiliac calcified atherosclerosis. Vascular patency is not evaluated in the absence of IV contrast. No lymphadenopathy. Reproductive: Negative. Other: No pelvic free fluid. Musculoskeletal: Advanced lumbar spine degeneration. Chronic lumbar spinal stenosis. No acute osseous abnormality identified. IMPRESSION: 1. Extensive large bowel diverticulosis from the hepatic flexure to the sigmoid. Difficult to exclude mild acute diverticulitis at the junction of the descending and sigmoid colon in the left lower quadrant, but the  appearance is stable to a 2015 CT. 2. Cholelithiasis.  No CT evidence of acute cholecystitis. 3. No other acute or inflammatory findings identified in the abdomen or pelvis. Normal appendix. 4. Aortic Atherosclerosis (ICD10-I70.0). Electronically Signed   By: Genevie Ann M.D.   On: 09/17/2017 14:51   Dg Chest 2 View  Result Date: 09/28/2017 CLINICAL DATA:  Nausea, vomiting and diarrhea. EXAM: CHEST  2 VIEW COMPARISON:  Chest radiograph 09/17/2017 FINDINGS: Single lead AICD device overlies the left hemithorax. Stable cardiomegaly status post median sternotomy. Elevation right hemidiaphragm. Heterogeneous opacities right lung base. Small right pleural effusion. No pneumothorax. IMPRESSION: Heterogeneous opacities right lung base may represent atelectasis or infection. Small right pleural effusion. Electronically Signed   By: Lovey Newcomer M.D.   On: 09/28/2017 19:24   Dg Chest 2 View  Result Date: 09/17/2017 CLINICAL DATA:  84 year old male with history respiratory distress. Evaluate for pneumonia. EXAM: CHEST  2 VIEW COMPARISON:  Chest x-ray 07/10/2015. FINDINGS: Lung volumes are extremely low. Chronic elevation of the right hemidiaphragm. Bibasilar opacities are slightly to represent areas of subsegmental atelectasis. No definite consolidative airspace disease. No pleural effusions. No evidence of pulmonary edema. Heart size is normal. Upper mediastinal contours are distorted by patient positioning. Aortic atherosclerosis. Status post median sternotomy for CABG mitral annuloplasty. Left-sided pacemaker/AICD device in place with lead tips projecting over the expected location of the right ventricular apex. IMPRESSION: 1. Low lung volumes with probable bibasilar subsegmental atelectasis. No radiographic evidence of acute cardiopulmonary disease. 2. Chronic elevation of the right hemidiaphragm is unchanged. 3. Aortic atherosclerosis. Electronically Signed   By: Vinnie Langton M.D.   On: 09/17/2017 14:41   Dg Ercp  Biliary & Pancreatic Ducts  Result Date: 09/29/2017 CLINICAL DATA:  ERCP with sphincterotomy for cholelithiasis EXAM: ERCP TECHNIQUE: Multiple spot images obtained with the fluoroscopic device and submitted for interpretation post-procedure. COMPARISON:  CT abdomen and pelvis - 09/28/2017; 09/17/2017 FLUOROSCOPY TIME:  1 minute, 37 seconds FINDINGS: Four spot intraoperative fluoroscopic images of the right upper abdominal quadrant during ERCP are provided for review. Initial image demonstrates an ERCP probe overlying the right upper abdominal quadrant. There is selective cannulation opacification of the common bile duct which appears moderately dilated. There is a ill-defined filling defect within the mid aspect of the common bile duct worrisome for choledocholithiasis. Subsequent images demonstrate insufflation of a  balloon within the distal aspect of the CBD with subsequent presumed biliary sweeping and sphincterotomy. IMPRESSION: ERCP with findings suggestive of choledocholithiasis with subsequent biliary sweeping and presumed sphincterotomy as detailed above. These images were submitted for radiologic interpretation only. Please see the procedural report for the amount of contrast and the fluoroscopy time utilized. Electronically Signed   By: Sandi Mariscal M.D.   On: 09/29/2017 16:52   US Abdomen Limited Ruq  Result Date: 09/17/2017 CLINICAL DATA:  Nausea and vomiting with upper abdominal pain EXAM: ULTRASOUND ABDOMEN LIMITED RIGHT UPPER QUADRANT COMPARISON:  Abdominal ultrasound Jan 12, 2017 FINDINGS: Gallbladder: Within the gallbladder, there are echogenic foci which shadow but do not convincingly move consistent with adherent gallstones. Largest apparent gallstone measures 10 mm. Gallbladder appears contracted. No gallbladder wall edema or pericholecystic fluid. No sonographic Murphy sign noted by sonographer. Common bile duct: Diameter: 4 mm. No intrahepatic or extrahepatic biliary duct dilatation. Liver:  No focal lesion identified. Within normal limits in parenchymal echogenicity. Portal vein is patent on color Doppler imaging with normal direction of blood flow towards the liver. IMPRESSION: Cholelithiasis with gallbladder somewhat contracted. No gallbladder wall edema or pericholecystic fluid. It may be prudent in this circumstance to consider nuclear medicine hepatobiliary imaging study to assess for cystic duct patency. Study otherwise unremarkable. Electronically Signed   By: Lowella Grip III M.D.   On: 09/17/2017 13:04    Lab Data:  CBC: Recent Labs  Lab 09/28/17 1914 09/30/17 0602  WBC 19.6* 7.5  NEUTROABS 17.2*  --   HGB 13.3 12.6*  HCT 40.6 37.2*  MCV 95.8 93.7  PLT 223 761   Basic Metabolic Panel: Recent Labs  Lab 09/28/17 1914 09/30/17 0602  NA 130* 131*  K 5.0 4.9  CL 96* 99*  CO2 25 22  GLUCOSE 148* 289*  BUN 48* 49*  CREATININE 2.45* 2.22*  CALCIUM 8.8* 8.2*   GFR: Estimated Creatinine Clearance: 36.3 mL/min (A) (by C-G formula based on SCr of 2.22 mg/dL (H)). Liver Function Tests: Recent Labs  Lab 09/28/17 1914 09/30/17 0641  AST 55* 59*  ALT 110* 93*  ALKPHOS 211* 376*  BILITOT 2.4* 1.3*  PROT 6.4* 5.8*  ALBUMIN 2.9* 2.3*   Recent Labs  Lab 09/28/17 1914  LIPASE 41   No results for input(s): AMMONIA in the last 168 hours. Coagulation Profile: No results for input(s): INR, PROTIME in the last 168 hours. Cardiac Enzymes: Recent Labs  Lab 09/28/17 1914 09/29/17 0044  TROPONINI 0.03* 0.03*   BNP (last 3 results) No results for input(s): PROBNP in the last 8760 hours. HbA1C: No results for input(s): HGBA1C in the last 72 hours. CBG: Recent Labs  Lab 09/29/17 1556 09/29/17 2022 09/30/17 0013 09/30/17 0521 09/30/17 0752  GLUCAP 87 343* 321* 273* 255*   Lipid Profile: No results for input(s): CHOL, HDL, LDLCALC, TRIG, CHOLHDL, LDLDIRECT in the last 72 hours. Thyroid Function Tests: No results for input(s): TSH, T4TOTAL, FREET4,  T3FREE, THYROIDAB in the last 72 hours. Anemia Panel: No results for input(s): VITAMINB12, FOLATE, FERRITIN, TIBC, IRON, RETICCTPCT in the last 72 hours. Urine analysis:    Component Value Date/Time   COLORURINE AMBER (A) 09/28/2017 1802   APPEARANCEUR CLEAR 09/28/2017 1802   LABSPEC 1.014 09/28/2017 1802   PHURINE 5.0 09/28/2017 1802   GLUCOSEU NEGATIVE 09/28/2017 1802   HGBUR NEGATIVE 09/28/2017 1802   BILIRUBINUR NEGATIVE 09/28/2017 1802   KETONESUR NEGATIVE 09/28/2017 1802   PROTEINUR NEGATIVE 09/28/2017 1802   UROBILINOGEN 0.2 02/04/2014  [place] 09/28/2017 1802   LEUKOCYTESUR NEGATIVE 09/28/2017 1802     Wayne Gordon M.D. [facility] 09/30/2017, 11:45 AM  Pager: [phone] Between 7am to 7pm - call Pager - [phone]  After 7pm go to [email or web address] - password [email or web address]  Call night coverage person covering after 7pm

## 2017-10-01 ENCOUNTER — Encounter (HOSPITAL_COMMUNITY): Payer: Self-pay | Admitting: Gastroenterology

## 2017-10-01 DIAGNOSIS — Z95810 Presence of automatic (implantable) cardiac defibrillator: Secondary | ICD-10-CM

## 2017-10-01 DIAGNOSIS — I482 Chronic atrial fibrillation: Secondary | ICD-10-CM

## 2017-10-01 DIAGNOSIS — K8050 Calculus of bile duct without cholangitis or cholecystitis without obstruction: Secondary | ICD-10-CM

## 2017-10-01 DIAGNOSIS — N179 Acute kidney failure, unspecified: Secondary | ICD-10-CM

## 2017-10-01 DIAGNOSIS — N183 Chronic kidney disease, stage 3 (moderate): Secondary | ICD-10-CM

## 2017-10-01 DIAGNOSIS — K8309 Other cholangitis: Secondary | ICD-10-CM

## 2017-10-01 LAB — HEPATIC FUNCTION PANEL
ALBUMIN: 2.4 g/dL — AB (ref 3.5–5.0)
ALK PHOS: 317 U/L — AB (ref 38–126)
ALT: 75 U/L — ABNORMAL HIGH (ref 17–63)
AST: 40 U/L (ref 15–41)
Bilirubin, Direct: 0.5 mg/dL (ref 0.1–0.5)
Indirect Bilirubin: 0.6 mg/dL (ref 0.3–0.9)
Total Bilirubin: 1.1 mg/dL (ref 0.3–1.2)
Total Protein: 5.6 g/dL — ABNORMAL LOW (ref 6.5–8.1)

## 2017-10-01 LAB — CBC
HEMATOCRIT: 37.4 % — AB (ref 39.0–52.0)
HEMOGLOBIN: 12.6 g/dL — AB (ref 13.0–17.0)
MCH: 31.6 pg (ref 26.0–34.0)
MCHC: 33.7 g/dL (ref 30.0–36.0)
MCV: 93.7 fL (ref 78.0–100.0)
Platelets: 294 10*3/uL (ref 150–400)
RBC: 3.99 MIL/uL — ABNORMAL LOW (ref 4.22–5.81)
RDW: 13.7 % (ref 11.5–15.5)
WBC: 10.8 10*3/uL — ABNORMAL HIGH (ref 4.0–10.5)

## 2017-10-01 LAB — BASIC METABOLIC PANEL
ANION GAP: 8 (ref 5–15)
BUN: 49 mg/dL — ABNORMAL HIGH (ref 6–20)
CHLORIDE: 103 mmol/L (ref 101–111)
CO2: 22 mmol/L (ref 22–32)
Calcium: 8 mg/dL — ABNORMAL LOW (ref 8.9–10.3)
Creatinine, Ser: 1.93 mg/dL — ABNORMAL HIGH (ref 0.61–1.24)
GFR calc Af Amer: 36 mL/min — ABNORMAL LOW (ref >60)
GFR calc non Af Amer: 31 mL/min — ABNORMAL LOW (ref >60)
GLUCOSE: 210 mg/dL — AB (ref 65–99)
POTASSIUM: 4.8 mmol/L (ref 3.5–5.1)
Sodium: 133 mmol/L — ABNORMAL LOW (ref 135–145)

## 2017-10-01 LAB — GLUCOSE, CAPILLARY
GLUCOSE-CAPILLARY: 216 mg/dL — AB (ref 65–99)
Glucose-Capillary: 196 mg/dL — ABNORMAL HIGH (ref 65–99)
Glucose-Capillary: 177 mg/dL — ABNORMAL HIGH (ref 65–99)

## 2017-10-01 NOTE — Progress Notes (Signed)
Patient given discharge instructions. Patient and wife verbalized understanding of instructions and follow up appointments. Patient left unit in stable condition.

## 2017-10-01 NOTE — Anesthesia Postprocedure Evaluation (Signed)
Anesthesia Post Note  Patient: Wayne Gordon  Procedure(s) Performed: ENDOSCOPIC RETROGRADE CHOLANGIOPANCREATOGRAPHY (ERCP) (N/A )     Patient location during evaluation: PACU Anesthesia Type: General Level of consciousness: awake and alert Pain management: pain level controlled Vital Signs Assessment: post-procedure vital signs reviewed and stable Respiratory status: spontaneous breathing, nonlabored ventilation, respiratory function stable and patient connected to nasal cannula oxygen Cardiovascular status: blood pressure returned to baseline and stable Postop Assessment: no apparent nausea or vomiting Anesthetic complications: no Comments: Spoke with [employer] rep post-operatively regarding placement of magnet over device during ERCP. No intervention needed.    Last Vitals:  Vitals:   10/01/17 0450 10/01/17 1002  BP: 133/65 122/70  Pulse: (!) 55 (!) 53  Resp: 18   Temp: 36.5 C 36.7 C  SpO2: 97% 99%    Last Pain:  Vitals:   10/01/17 1002  TempSrc: Oral  PainSc:                  Ryan P Ellender

## 2017-10-01 NOTE — Discharge Summary (Signed)
Physician Discharge Summary  Wayne Gordon YKZ:[id number] DOB: 1933/12/18 DOA: 09/28/2017  PCP: Octavio Graves, DO  Admit date: 09/28/2017 Discharge date: 10/01/2017  Admitted From: home Disposition:  home  Recommendations for Outpatient Follow-up:  1. Follow up with PCP in 1-2 weeks 2. Follow-up with drain clinic in 1 day as scheduled  Home Health: None Equipment/Devices: None  Discharge Condition: Stable CODE STATUS: Full code Diet recommendation: Heart healthy  HPI: Per Dr. Manuella Ghazi, Wayne Gordon is a 84 y.o. male with medical history significant for CAD status post CABG, mitral valve repair, chronic systolic CHF with EF 93-90%, AICD, hypertension, type 2 diabetes, atrial fibrillation on Eliquis, and recent subtotal cholecystectomy with drain placement and complications of sepsis due to cholangitis and Klebsiella bacteremia.  He was recently discharged from [facility] on 09/22/2017.  He presents today to the emergency department with some nausea and an episode of vomiting yesterday along with some diarrhea noted today.  He denies any significant abdominal pain, fever, or chills but was quite concerned given his recent operation.  He has also had some chills for the last 2 days and some decreased urine output as well as some low blood pressure readings.  He seems to feel that he is dehydrated as he has not had adequate oral intake in the past 2 days on account of the symptoms.  He denies any chest pain, palpitations, diaphoresis, or shortness of breath.  No blood noted in stools or emesis.    Hospital Course: Acute cholangitis with CBD obstruction secondary to 9 mm gallstone in the setting of recent subtotal lap chole with drain placement - Patient met sepsis criteria at the time of admission with hypotension, low-grade temp, cholangitis -Patient was placed on n.p.o. status, IV fluids, IV Rocephin and Flagyl. -Eliquis was placed on hold, GI was consulted. -Patient underwent ERCP on 2/1  which showed choledocholithiasis, complete removal was accomplished by biliary sphincterotomy and balloon extraction -Following the ERCP clinically patient returned to baseline, no further abdominal pain, LFTs are improving, he is able to tolerate a regular diet.  He is already on antibiotics at home for his history of Klebsiella bacteremia, current cultures remain negative and he is to resume home antibiotics as per prior plan Acute renal failure superimposed on stage 3 chronic kidney disease ([place]) -Creatinine 2.45 at the time of admission, was 1.8 on 1/25 when patient was discharged from previous admission.  Creatinine was likely worsened in the setting of sepsis, improved with fluids and 1.9 on discharge Diabetes mellitus type 2 -Continue home regimen Mildly elevated troponins -In the setting of sepsis, chronic renal disease, acute cholangitis, has underlying history of CAD.   -No chest pain. Hypertension -BP currently stable, continue hydralazine as needed Chronic atrial fibrillation -Rate controlled, per GI hold anticoagulation for 3 days post procedure, instructed patient to resume on 2/5    Discharge Diagnoses:  Principal Problem:   Cholangitis Active Problems:   S/P mitral valve repair   S/P CABG x 3   HTN (hypertension)   Automatic implantable cardioverter-defibrillator in situ   Diabetes mellitus, type 2 (HCC)   Atrial fibrillation (HCC)   Acute renal failure superimposed on stage 3 chronic kidney disease (HCC)   Elevated troponin     Discharge Instructions   Allergies as of 10/01/2017      Reactions   Ace Inhibitors Swelling, Other (See Comments)   Kidney issues; angioedema per PMH in chart   Penicillins Palpitations  Medication List    TAKE these medications   apixaban 2.5 MG Tabs tablet Commonly known as:  ELIQUIS Take 1 tablet (2.5 mg total) by mouth 2 (two) times daily.   CALCIUM-MAGNESIUM-ZINC PO Take 2 tablets by mouth daily.   carvedilol  6.25 MG tablet Commonly known as:  COREG Take 1 tablet (6.25 mg total) by mouth 2 (two) times daily. What changed:  how much to take   cephALEXin 500 MG capsule Commonly known as:  KEFLEX Take 1 capsule (500 mg total) by mouth every 8 (eight) hours.   DEPO-TESTOSTERONE 200 MG/ML injection Generic drug:  testosterone cypionate Inject 0.5 mg into the muscle every 21 ( twenty-one) days.   furosemide 20 MG tablet Commonly known as:  LASIX Take 1 tablet (20 mg total) by mouth daily.   glipiZIDE 10 MG 24 hr tablet Commonly known as:  GLUCOTROL XL Take 10 mg by mouth 2 times daily at 12 noon and 4 pm. Pt takes it in the AM and PM   hydrALAZINE 25 MG tablet Commonly known as:  APRESOLINE Take 25 mg by mouth 3 (three) times daily as needed (for bp levels).   losartan 25 MG tablet Commonly known as:  COZAAR Take 1 tablet (25 mg total) by mouth daily.   multivitamins ther. w/minerals Tabs tablet Take 1 tablet by mouth daily.   pantoprazole 40 MG tablet Commonly known as:  PROTONIX Take 1 tablet by mouth daily.   rosuvastatin 20 MG tablet Commonly known as:  CRESTOR Take 20 mg by mouth every evening.   TOUJEO SOLOSTAR 300 UNIT/ML Sopn Generic drug:  Insulin Glargine Inject 12-15 Units as directed daily.      Follow-up Information    Octavio Graves, DO. Schedule an appointment as soon as possible for a visit in 3 week(s).   Contact information: [address] [address] [phone]           Consultations:  GI  Procedures/Studies:  ERCP 2/1 Impression:               - The major papilla appeared normal.                           - The biliary system was dilated.                           - The patient has had a cholecystectomy.                           - Choledocholithiasis was found. Complete removal                            was accomplished by biliary sphincterotomy and                            balloon extraction.                            - A biliary sphincterotomy was performed.                           - The biliary tree was swept. Recommendation:           - Return patient  to hospital ward for ongoing care.                           - Resume regular diet.                           - Continue present medications.                           - Hold or minimize anticoagulation for the next 3     Ct Abdomen Pelvis Wo Contrast  Result Date: 09/28/2017 CLINICAL DATA:  Patient with nausea, vomiting and diarrhea. Prior partial gallbladder removal 09/22/2017. EXAM: CT ABDOMEN AND PELVIS WITHOUT CONTRAST TECHNIQUE: Multidetector CT imaging of the abdomen and pelvis was performed following the standard protocol without IV contrast. COMPARISON:  CT abdomen pelvis 09/17/2017. FINDINGS: Lower chest: Subpleural consolidation within the right lower lobe. Small right pleural effusion. Trace left pleural effusion. Hepatobiliary: Liver is normal in size and contour. Postsurgical changes compatible with partial colectomy. Surgical drain terminates along the hepatic margin. No fluid collection is identified. Mild common bile duct dilatation measuring 11 mm. There is a 9 mm stone within the distal common bile duct (image 29; series 2). Pancreas: Unremarkable Spleen: Unremarkable Adrenals/Urinary Tract: Adrenal glands are normal. Kidneys are symmetric in size. No hydronephrosis. Small fluid attenuation structure inferior pole right kidney. Urinary bladder is unremarkable. Stomach/Bowel: Extensive descending and sigmoid colonic diverticulosis. No CT evidence for acute diverticulitis. The appendix is normal. No evidence for bowel obstruction. No significant free fluid or free intraperitoneal air. Normal morphology of the stomach. Vascular/Lymphatic: Normal caliber abdominal aorta. Peripheral calcified atherosclerotic plaque. No retroperitoneal lymphadenopathy. Reproductive: Prostate is mildly enlarged. Other: Small bilateral fat containing inguinal hernias.  Musculoskeletal: No aggressive or acute appearing osseous lesions. Lumbar spine degenerative changes. IMPRESSION: 1. There is a 9 mm stone within the distal common bile duct resulting in mild common bile duct dilatation. 2. Postsurgical changes compatible with cholecystectomy. Drain catheter courses along the hepatic margin. No fluid collection is identified. 3. Consolidation within the right lower lobe and adjacent small right pleural effusion. Findings may represent atelectasis or infection. Electronically Signed   By: Lovey Newcomer M.D.   On: 09/28/2017 19:15   Ct Abdomen Pelvis Wo Contrast  Result Date: 09/17/2017 CLINICAL DATA:  84 year old male with abdominal pain, nausea vomiting this morning. Difficulty urinating, dark colored urine. EXAM: CT ABDOMEN AND PELVIS WITHOUT CONTRAST TECHNIQUE: Multidetector CT imaging of the abdomen and pelvis was performed following the standard protocol without IV contrast. COMPARISON:  CT Abdomen and Pelvis 02/03/2014. FINDINGS: Lower chest: Cardiomegaly. Calcified coronary artery atherosclerosis. Prior sternotomy. No pericardial effusion. Chronic pulmonary architectural distortion in the right lower lobe. Superimposed chronic elevation of the right hemidiaphragm, with right middle and lower lobe atelectasis. Superimposed other mild dependent pulmonary atelectasis. No pleural effusion. Hepatobiliary: Cholelithiasis (series 2, image 12). No pericholecystic inflammation. Negative visible noncontrast CT. Pancreas: Chronic pancreatic atrophy. Spleen: Negative. Adrenals/Urinary Tract: Normal adrenal glands. Chronic bilateral perinephric stranding which is nonspecific. No hydronephrosis. Both ureters are decompressed. No urologic calculus identified. Decompressed and unremarkable urinary bladder. No perivesical stranding. Stomach/Bowel: Retained low-density stool in the rectum and distal sigmoid colon. Chronic sigmoid redundancy with severe diverticulosis throughout the mid and  proximal sigmoid. Difficult to exclude mild inflammation at the junction of the descending and sigmoid colon (coronal image 47), although this is similar appearance  to the 2015 CT. Severe diverticulosis continues in the left colon and throughout the transverse colon. No active inflammation. Oral contrast has reached the cecum which is on a lax mesentery in the anterior right abdomen. Negative cecum and appendix. Negative terminal ileum. No dilated small bowel. Moderately distended stomach with oral contrast. Negative duodenum. No abdominal free fluid or free air. Vascular/Lymphatic: Extensive Aortoiliac calcified atherosclerosis. Vascular patency is not evaluated in the absence of IV contrast. No lymphadenopathy. Reproductive: Negative. Other: No pelvic free fluid. Musculoskeletal: Advanced lumbar spine degeneration. Chronic lumbar spinal stenosis. No acute osseous abnormality identified. IMPRESSION: 1. Extensive large bowel diverticulosis from the hepatic flexure to the sigmoid. Difficult to exclude mild acute diverticulitis at the junction of the descending and sigmoid colon in the left lower quadrant, but the appearance is stable to a 2015 CT. 2. Cholelithiasis.  No CT evidence of acute cholecystitis. 3. No other acute or inflammatory findings identified in the abdomen or pelvis. Normal appendix. 4. Aortic Atherosclerosis (ICD10-I70.0). Electronically Signed   By: Genevie Ann M.D.   On: 09/17/2017 14:51   Dg Chest 2 View  Result Date: 09/28/2017 CLINICAL DATA:  Nausea, vomiting and diarrhea. EXAM: CHEST  2 VIEW COMPARISON:  Chest radiograph 09/17/2017 FINDINGS: Single lead AICD device overlies the left hemithorax. Stable cardiomegaly status post median sternotomy. Elevation right hemidiaphragm. Heterogeneous opacities right lung base. Small right pleural effusion. No pneumothorax. IMPRESSION: Heterogeneous opacities right lung base may represent atelectasis or infection. Small right pleural effusion.  Electronically Signed   By: Lovey Newcomer M.D.   On: 09/28/2017 19:24   Dg Chest 2 View  Result Date: 09/17/2017 CLINICAL DATA:  84 year old male with history respiratory distress. Evaluate for pneumonia. EXAM: CHEST  2 VIEW COMPARISON:  Chest x-ray 07/10/2015. FINDINGS: Lung volumes are extremely low. Chronic elevation of the right hemidiaphragm. Bibasilar opacities are slightly to represent areas of subsegmental atelectasis. No definite consolidative airspace disease. No pleural effusions. No evidence of pulmonary edema. Heart size is normal. Upper mediastinal contours are distorted by patient positioning. Aortic atherosclerosis. Status post median sternotomy for CABG mitral annuloplasty. Left-sided pacemaker/AICD device in place with lead tips projecting over the expected location of the right ventricular apex. IMPRESSION: 1. Low lung volumes with probable bibasilar subsegmental atelectasis. No radiographic evidence of acute cardiopulmonary disease. 2. Chronic elevation of the right hemidiaphragm is unchanged. 3. Aortic atherosclerosis. Electronically Signed   By: Vinnie Langton M.D.   On: 09/17/2017 14:41   Dg Ercp Biliary & Pancreatic Ducts  Result Date: 09/29/2017 CLINICAL DATA:  ERCP with sphincterotomy for cholelithiasis EXAM: ERCP TECHNIQUE: Multiple spot images obtained with the fluoroscopic device and submitted for interpretation post-procedure. COMPARISON:  CT abdomen and pelvis - 09/28/2017; 09/17/2017 FLUOROSCOPY TIME:  1 minute, 37 seconds FINDINGS: Four spot intraoperative fluoroscopic images of the right upper abdominal quadrant during ERCP are provided for review. Initial image demonstrates an ERCP probe overlying the right upper abdominal quadrant. There is selective cannulation opacification of the common bile duct which appears moderately dilated. There is a ill-defined filling defect within the mid aspect of the common bile duct worrisome for choledocholithiasis. Subsequent images  demonstrate insufflation of a balloon within the distal aspect of the CBD with subsequent presumed biliary sweeping and sphincterotomy. IMPRESSION: ERCP with findings suggestive of choledocholithiasis with subsequent biliary sweeping and presumed sphincterotomy as detailed above. These images were submitted for radiologic interpretation only. Please see the procedural report for the amount of contrast and the fluoroscopy time utilized. Electronically Signed  By: Sandi Mariscal M.D.   On: 09/29/2017 16:52   US Abdomen Limited Ruq  Result Date: 09/17/2017 CLINICAL DATA:  Nausea and vomiting with upper abdominal pain EXAM: ULTRASOUND ABDOMEN LIMITED RIGHT UPPER QUADRANT COMPARISON:  Abdominal ultrasound Jan 12, 2017 FINDINGS: Gallbladder: Within the gallbladder, there are echogenic foci which shadow but do not convincingly move consistent with adherent gallstones. Largest apparent gallstone measures 10 mm. Gallbladder appears contracted. No gallbladder wall edema or pericholecystic fluid. No sonographic Murphy sign noted by sonographer. Common bile duct: Diameter: 4 mm. No intrahepatic or extrahepatic biliary duct dilatation. Liver: No focal lesion identified. Within normal limits in parenchymal echogenicity. Portal vein is patent on color Doppler imaging with normal direction of blood flow towards the liver. IMPRESSION: Cholelithiasis with gallbladder somewhat contracted. No gallbladder wall edema or pericholecystic fluid. It may be prudent in this circumstance to consider nuclear medicine hepatobiliary imaging study to assess for cystic duct patency. Study otherwise unremarkable. Electronically Signed   By: Lowella Grip III M.D.   On: 09/17/2017 13:04      Subjective: - no chest pain, shortness of breath, no abdominal pain, nausea or vomiting.   Discharge Exam: Vitals:   10/01/17 0450 10/01/17 1002  BP: 133/65 122/70  Pulse: (!) 55 (!) 53  Resp: 18   Temp: 97.7 F (36.5 C) 98.1 F (36.7 C)    SpO2: 97% 99%    General: Pt is alert, awake, not in acute distress Cardiovascular: RRR, S1/S2 +, no rubs, no gallops Respiratory: CTA bilaterally, no wheezing, no rhonchi Abdominal: Soft, NT, ND, bowel sounds + Extremities: no edema, no cyanosis    The results of significant diagnostics from this hospitalization (including imaging, microbiology, ancillary and laboratory) are listed below for reference.     Microbiology: Recent Results (from the past 240 hour(s))  Urine culture     Status: None   Collection Time: 09/28/17  6:02 PM  Result Value Ref Range Status   Specimen Description   Final    URINE, CLEAN CATCH Performed at [facility], [address] [place], [place] [address]    Special Requests   Final    NONE Performed at [facility], [address] [place], [place] [address]    Culture   Final    NO GROWTH Performed at [facility], [place] [address], [place], [place] [address]    Report Status 09/30/2017 FINAL  Final  Culture, blood (routine x 2)     Status: None (Preliminary result)   Collection Time: 09/28/17  7:14 PM  Result Value Ref Range Status   Specimen Description RIGHT ANTECUBITAL  Final   Special Requests   Final    BOTTLES DRAWN AEROBIC AND ANAEROBIC Blood Culture adequate volume   Culture   Final    NO GROWTH 3 DAYS Performed at [facility], [address] [place], [place] [address]    Report Status PENDING  Incomplete  Culture, blood (routine x 2)     Status: None (Preliminary result)   Collection Time: 09/28/17  7:15 PM  Result Value Ref Range Status   Specimen Description BLOOD RIGHT HAND  Final   Special Requests   Final    BOTTLES DRAWN AEROBIC ONLY Blood Culture adequate volume   Culture   Final    NO GROWTH 3 DAYS Performed at [facility], [address] [place], [place] [address]    Report Status PENDING  Incomplete     Labs: BNP (last 3 results) No  results for input(s): BNP in the last 8760 hours. Basic Metabolic  Panel: Recent Labs  Lab 09/28/17 1914 09/30/17 0602 10/01/17 0525  NA 130* 131* 133*  K 5.0 4.9 4.8  CL 96* 99* 103  CO2 _0 GLUCOSE 148* 289* 210*  BUN 48* 49* 49*  CREATININE 2.45* 2.22* 1.93*  CALCIUM 8.8* 8.2* 8.0*   Liver Function Tests: Recent Labs  Lab 09/28/17 1914 09/30/17 0641 10/01/17 0525  AST 55* 59* 40  ALT 110* 93* 75*  ALKPHOS 211* 376* 317*  BILITOT 2.4* 1.3* 1.1  PROT 6.4* 5.8* 5.6*  ALBUMIN 2.9* 2.3* 2.4*   Recent Labs  Lab 09/28/17 1914  LIPASE 41   No results for input(s): AMMONIA in the last 168 hours. CBC: Recent Labs  Lab 09/28/17 1914 09/30/17 0602 10/01/17 0525  WBC 19.6* 7.5 10.8*  NEUTROABS 17.2*  --   --   HGB 13.3 12.6* 12.6*  HCT 40.6 37.2* 37.4*  MCV 95.8 93.7 93.7  PLT 223 266 294   Cardiac Enzymes: Recent Labs  Lab 09/28/17 1914 09/29/17 0044  TROPONINI 0.03* 0.03*   BNP: Invalid input(s): POCBNP CBG: Recent Labs  Lab 09/30/17 1827 09/30/17 2007 10/01/17 0020 10/01/17 0446 10/01/17 0745  GLUCAP 298* 311* 216* 196* 177*   D-Dimer No results for input(s): DDIMER in the last 72 hours. Hgb A1c Recent Labs    09/30/17 1256  HGBA1C 7.8*   Lipid Profile No results for input(s): CHOL, HDL, LDLCALC, TRIG, CHOLHDL, LDLDIRECT in the last 72 hours. Thyroid function studies No results for input(s): TSH, T4TOTAL, T3FREE, THYROIDAB in the last 72 hours.  Invalid input(s): FREET3 Anemia work up No results for input(s): VITAMINB12, FOLATE, FERRITIN, TIBC, IRON, RETICCTPCT in the last 72 hours. Urinalysis    Component Value Date/Time   COLORURINE AMBER (A) 09/28/2017 1802   APPEARANCEUR CLEAR 09/28/2017 1802   LABSPEC 1.014 09/28/2017 1802   PHURINE 5.0 09/28/2017 1802   GLUCOSEU NEGATIVE 09/28/2017 1802   HGBUR NEGATIVE 09/28/2017 1802   BILIRUBINUR NEGATIVE 09/28/2017 1802   KETONESUR NEGATIVE 09/28/2017 1802   PROTEINUR NEGATIVE 09/28/2017 1802   UROBILINOGEN 0.2 02/04/2014 0939   NITRITE NEGATIVE  09/28/2017 1802   LEUKOCYTESUR NEGATIVE 09/28/2017 1802   Sepsis Labs Invalid input(s): PROCALCITONIN,  WBC,  LACTICIDVEN   Time coordinating discharge: 45 minutes  SIGNED:  Marzetta Board, MD  [facility] 10/01/2017, 11:29 AM Pager [phone]  If 7PM-7AM, please contact night-coverage [email or web address] Password [email or web address]

## 2017-10-01 NOTE — Discharge Instructions (Signed)
Follow with Wayne Jester, DO in 2-3 weeks  Hold Eliquis for 1 more day, resume taking on Tuesday 2/5  Please get a complete blood count and chemistry panel checked by your Primary MD at your next visit, and again as instructed by your Primary MD. Please get your medications reviewed and adjusted by your Primary MD.  Please request your Primary MD to go over all Hospital Tests and Procedure/Radiological results at the follow up, please get all Hospital records sent to your Prim MD by signing hospital release before you go home.  If you had Pneumonia of Lung problems at the Hospital: Please get a 2 view Chest X ray done in 6-8 weeks after hospital discharge or sooner if instructed by your Primary MD.  If you have Congestive Heart Failure: Please call your Cardiologist or Primary MD anytime you have any of the following symptoms:  1) 3 pound weight gain in 24 hours or 5 pounds in 1 week  2) shortness of breath, with or without a dry hacking cough  3) swelling in the hands, feet or stomach  4) if you have to sleep on extra pillows at night in order to breathe  Follow cardiac low salt diet and 1.5 lit/day fluid restriction.  If you have diabetes Accuchecks 4 times/day, Once in AM empty stomach and then before each meal. Log in all results and show them to your primary doctor at your next visit. If any glucose reading is under 80 or above 300 call your primary MD immediately.  If you have Seizure/Convulsions/Epilepsy: Please do not drive, operate heavy machinery, participate in activities at heights or participate in high speed sports until you have seen by Primary MD or a Neurologist and advised to do so again.  If you had Gastrointestinal Bleeding: Please ask your Primary MD to check a complete blood count within one week of discharge or at your next visit. Your endoscopic/colonoscopic biopsies that are pending at the time of discharge, will also need to followed by your Primary  MD.  Get Medicines reviewed and adjusted. Please take all your medications with you for your next visit with your Primary MD  Please request your Primary MD to go over all hospital tests and procedure/radiological results at the follow up, please ask your Primary MD to get all Hospital records sent to his/her office.  If you experience worsening of your admission symptoms, develop shortness of breath, life threatening emergency, suicidal or homicidal thoughts you must seek medical attention immediately by calling 911 or calling your MD immediately  if symptoms less severe.  You must read complete instructions/literature along with all the possible adverse reactions/side effects for all the Medicines you take and that have been prescribed to you. Take any new Medicines after you have completely understood and accpet all the possible adverse reactions/side effects.   Do not drive or operate heavy machinery when taking Pain medications.   Do not take more than prescribed Pain, Sleep and Anxiety Medications  Special Instructions: If you have smoked or chewed Tobacco  in the last 2 yrs please stop smoking, stop any regular Alcohol  and or any Recreational drug use.  Wear Seat belts while driving.  Please note You were cared for by a hospitalist during your hospital stay. If you have any questions about your discharge medications or the care you received while you were in the hospital after you are discharged, you can call the unit and asked to speak with the hospitalist on call  if the hospitalist that took care of you is not available. Once you are discharged, your primary care physician will handle any further medical issues. Please note that NO REFILLS for any discharge medications will be authorized once you are discharged, as it is imperative that you return to your primary care physician (or establish a relationship with a primary care physician if you do not have one) for your aftercare needs so  that they can reassess your need for medications and monitor your lab values.  You can reach the hospitalist office at phone [phone] or fax [phone]   If you do not have a primary care physician, you can call [phone] for a physician referral.  Activity: As tolerated with Full fall precautions use walker/cane & assistance as needed  Diet: heart healthy, diabetic  Disposition Home

## 2017-10-03 LAB — CULTURE, BLOOD (ROUTINE X 2)
CULTURE: NO GROWTH
CULTURE: NO GROWTH
SPECIAL REQUESTS: ADEQUATE
Special Requests: ADEQUATE

## 2017-11-02 ENCOUNTER — Ambulatory Visit (INDEPENDENT_AMBULATORY_CARE_PROVIDER_SITE_OTHER): Payer: Medicare HMO | Admitting: *Deleted

## 2017-11-02 DIAGNOSIS — I255 Ischemic cardiomyopathy: Secondary | ICD-10-CM | POA: Diagnosis not present

## 2017-11-02 NOTE — Progress Notes (Signed)
Remote ICD transmission.   

## 2017-11-03 ENCOUNTER — Encounter: Payer: Self-pay | Admitting: Cardiology

## 2017-11-19 LAB — CUP PACEART REMOTE DEVICE CHECK
Date Time Interrogation Session: 20190324150743
Implantable Lead Implant Date: 20130731
Implantable Lead Location: 753860
Implantable Lead Model: 365501
Implantable Lead Serial Number: 10505793
Implantable Pulse Generator Implant Date: 20130731
MDC IDC PG SERIAL: 60687186

## 2018-01-01 ENCOUNTER — Telehealth: Payer: Self-pay | Admitting: Cardiovascular Disease

## 2018-01-01 NOTE — Telephone Encounter (Signed)
Two sample boxes of Eliquis 5 mg placed at front desk for pick up, with benefit review.

## 2018-01-01 NOTE — Telephone Encounter (Signed)
Requesting samples of Eliquis / tg  °

## 2018-02-01 ENCOUNTER — Ambulatory Visit (INDEPENDENT_AMBULATORY_CARE_PROVIDER_SITE_OTHER): Payer: Medicare HMO | Admitting: *Deleted

## 2018-02-01 DIAGNOSIS — I255 Ischemic cardiomyopathy: Secondary | ICD-10-CM | POA: Diagnosis not present

## 2018-02-01 DIAGNOSIS — I5022 Chronic systolic (congestive) heart failure: Secondary | ICD-10-CM

## 2018-02-01 NOTE — Progress Notes (Signed)
Remote ICD transmission.   

## 2018-02-08 ENCOUNTER — Encounter: Payer: Self-pay | Admitting: Internal Medicine

## 2018-02-08 ENCOUNTER — Ambulatory Visit (INDEPENDENT_AMBULATORY_CARE_PROVIDER_SITE_OTHER): Payer: Medicare HMO | Admitting: Internal Medicine

## 2018-02-08 VITALS — BP 122/62 | HR 55 | Wt 238.0 lb

## 2018-02-08 DIAGNOSIS — I442 Atrioventricular block, complete: Secondary | ICD-10-CM

## 2018-02-08 DIAGNOSIS — I480 Paroxysmal atrial fibrillation: Secondary | ICD-10-CM | POA: Diagnosis not present

## 2018-02-08 DIAGNOSIS — I5022 Chronic systolic (congestive) heart failure: Secondary | ICD-10-CM

## 2018-02-08 DIAGNOSIS — E291 Testicular hypofunction: Secondary | ICD-10-CM | POA: Insufficient documentation

## 2018-02-08 DIAGNOSIS — I472 Ventricular tachycardia, unspecified: Secondary | ICD-10-CM

## 2018-02-08 DIAGNOSIS — Z95810 Presence of automatic (implantable) cardiac defibrillator: Secondary | ICD-10-CM | POA: Diagnosis not present

## 2018-02-08 DIAGNOSIS — D509 Iron deficiency anemia, unspecified: Secondary | ICD-10-CM | POA: Insufficient documentation

## 2018-02-08 DIAGNOSIS — E119 Type 2 diabetes mellitus without complications: Secondary | ICD-10-CM | POA: Insufficient documentation

## 2018-02-08 DIAGNOSIS — N183 Chronic kidney disease, stage 3 unspecified: Secondary | ICD-10-CM | POA: Insufficient documentation

## 2018-02-08 NOTE — Patient Instructions (Signed)

## 2018-02-08 NOTE — Progress Notes (Addendum)
HPI Mr. Bail returns for followup. He is a pleasant 84 yo man with an ICM, s/p remote MI, s/p CABG with an EF 25%, chronic atrial fib and CHB, s/p ICD implant. He has a VDD device and has been pacing in the ventricle for the past year due to gradual decline in his conduction. Despite this, he has only class 2 CHF. He has been bothered by right upper quadrant pain and underwent surgery and ERCP several months ago. At that time he had klebsiella bacteremia. He has slowly improved but does not think he is back to his baseline. He denies chest pain or sob. He has very minimal edema. No ICD shocks.    Current Outpatient Medications  Medication Sig Dispense Refill  . CALCIUM-MAGNESIUM-ZINC PO Take 2 tablets by mouth daily.    . carvedilol (COREG) 12.5 MG tablet Take 12.5 mg by mouth 2 (two) times daily.    [name] Kitchen ELIQUIS 5 MG TABS tablet Take 2.5 mg by mouth 2 (two) times daily.    . furosemide (LASIX) 20 MG tablet Take 1 tablet (20 mg total) by mouth daily. 90 tablet 3  . glipiZIDE (GLUCOTROL XL) 10 MG 24 hr tablet Take 10 mg by mouth 2 times daily at 12 noon and 4 pm. Pt takes it in the AM and PM    . hydrALAZINE (APRESOLINE) 25 MG tablet Take 25 mg by mouth 3 (three) times daily as needed (for bp levels).    . Insulin Glargine (TOUJEO SOLOSTAR) 300 UNIT/ML SOPN Inject 12-15 Units as directed daily.     [name] Kitchen losartan (COZAAR) 25 MG tablet Take 1 tablet (25 mg total) by mouth daily. 30 tablet 11  . Multiple Vitamins-Minerals (MULTIVITAMINS THER. W/MINERALS) TABS Take 1 tablet by mouth daily.    . pantoprazole (PROTONIX) 40 MG tablet Take 1 tablet by mouth daily.    . rosuvastatin (CRESTOR) 20 MG tablet Take 20 mg by mouth every evening.     . testosterone cypionate (DEPO-TESTOSTERONE) 200 MG/ML injection Inject 0.5 mg into the muscle every 21 ( twenty-one) days.      No current facility-administered medications for this visit.      Past Medical History:  Diagnosis Date  . Allergy to ACE  inhibitors    Angioedema many years ago; patient has tolerated Losartan (ARB) in the past - it was held during [facility] for GI bleed and worsening renal function >> resume Losartan 25 mg QD 06/2015  . Arthritis   . Atrial fibrillation (HCC)    PAF, CHADs2Vasc = 5  . Carotid artery disease (HCC)    60-79% LICA  . CHF (congestive heart failure) (HCC)   . Coronary artery disease   . Diabetes mellitus   . GERD (gastroesophageal reflux disease)   . Hyperlipidemia   . Hypertension   . ICD (implantable cardiac defibrillator) in place 03/28/2012   [place], Dr. Ladona Ridgel 03/28/12  . Ischemic cardiomyopathy    S/P CABG x 3; EF 25%  . Mitral regurgitation    S/P mitral valve repair 2013  . Myocardial infarction (HCC)   . Renal insufficiency     ROS:   All systems reviewed and negative except as noted in the HPI.   Past Surgical History:  Procedure Laterality Date  . CHOLECYSTECTOMY N/A 09/20/2017   Procedure: LAPAROSCOPIC SUBTOTAL CHOLECYSTECTOMY;  Surgeon: Axel Filler, MD;  Location: [facility] OR;  Service: General;  Laterality: N/A;  . COLONOSCOPY N/A 04/24/2014   RMR Pancolonic diverticulosis  .  CORONARY ARTERY BYPASS GRAFT  10/17/2011   Procedure: CORONARY ARTERY BYPASS GRAFTING (CABG);  Surgeon: Purcell Nails, MD;  Location: [facility] OR;  Service: Open Heart Surgery;  Laterality: N/A;  . ERCP N/A 09/29/2017   Procedure: ENDOSCOPIC RETROGRADE CHOLANGIOPANCREATOGRAPHY (ERCP);  Surgeon: Jeani Hawking, MD;  Location: [facility] ENDOSCOPY;  Service: Endoscopy;  Laterality: N/A;  . ESOPHAGOGASTRODUODENOSCOPY N/A 04/24/2014   RMR Subtle nodularity the gastric mucosa of uncertain significance-status post biopsy. Hiatal hernia. chronic inflammation, no H.pylori  . ESOPHAGOGASTRODUODENOSCOPY N/A 06/24/2016   Dr. Jena Gauss: normal esophagus, small hiatal hernia, normal duodenum  . ICD  03/28/2012  . IMPLANTABLE CARDIOVERTER DEFIBRILLATOR IMPLANT N/A 03/28/2012   Procedure: IMPLANTABLE CARDIOVERTER DEFIBRILLATOR  IMPLANT;  Surgeon: Marinus Maw, MD;  Location: [facility] CATH LAB;  Service: Cardiovascular;  Laterality: N/A;  . KNEE ARTHROSCOPY  ~ 2008   right  . LEFT AND RIGHT HEART CATHETERIZATION WITH CORONARY ANGIOGRAM N/A 10/12/2011   Procedure: LEFT AND RIGHT HEART CATHETERIZATION WITH CORONARY ANGIOGRAM;  Surgeon: Iran Ouch, MD;  Location: [facility] CATH LAB;  Service: Cardiovascular;  Laterality: N/A;  . MITRAL VALVE REPAIR  10/17/2011   Procedure: MITRAL VALVE REPAIR (MVR);  Surgeon: Purcell Nails, MD;  Location: [facility] OR;  Service: Open Heart Surgery;  Laterality: N/A;  . NASAL SINUS SURGERY  1990's   right     Family History  Problem Relation Age of Onset  . Heart disease Mother   . Diabetes Father   . Cardiomyopathy Father   . Colon cancer Neg Hx      Social History   Socioeconomic History  . Marital status: Married    Spouse name: Not on file  . Number of children: Not on file  . Years of education: Not on file  . Highest education level: Not on file  Occupational History  . Occupation: Retired  Engineer, production  . Financial resource strain: Not on file  . Food insecurity:    Worry: Not on file    Inability: Not on file  . Transportation needs:    Medical: Not on file    Non-medical: Not on file  Tobacco Use  . Smoking status: Never Smoker  . Smokeless tobacco: Never Used  Substance and Sexual Activity  . Alcohol use: No    Alcohol/week: 0.0 oz  . Drug use: No  . Sexual activity: Yes  Lifestyle  . Physical activity:    Days per week: Not on file    Minutes per session: Not on file  . Stress: Not on file  Relationships  . Social connections:    Talks on phone: Not on file    Gets together: Not on file    Attends religious service: Not on file    Active member of club or organization: Not on file    Attends meetings of clubs or organizations: Not on file    Relationship status: Not on file  . Intimate partner violence:    Fear of current or ex partner: Not on file     Emotionally abused: Not on file    Physically abused: Not on file    Forced sexual activity: Not on file  Other Topics Concern  . Not on file  Social History Narrative  . Not on file     BP 122/62   Pulse (!) 55   Wt 238 lb (108 kg)   BMI 28.22 kg/m   Physical Exam:  Well appearing 84 yo man, NAD HEENT: Unremarkable Neck:  6  cm JVD, no thyromegally Lymphatics:  No adenopathy Back:  No CVA tenderness Lungs:  Clear with no wheezes HEART:  Regular rate rhythm, no murmurs, no rubs, no clicks Abd:  soft, positive bowel sounds, no organomegally, no rebound, no guarding Ext:  2 plus pulses, no edema, no cyanosis, no clubbing Skin:  No rashes no nodules Neuro:  CN II through XII intact, motor grossly intact  EKG - p synchronous ventricular pacing  DEVICE  Normal device function.  See PaceArt for details.   Assess/Plan: 1. ICD - his device is working normally. We reprogrammed him to VDD pacing 2. CHB - since his ICD has been placed, the patient has developed CHB. I have reprogrammed his device to VDD and alterted him about symptoms he might experience from RV only pacing. 3. CAD - he denies anginal symptoms, s/p CABG 4. Chronic systolic heart failure - his symptoms are class 2 despite medical therapy and RV only pacing. I cautioned the patient that if he were to start feelling worse, that he could have his device upgraded.  Leonia Reeves.D.

## 2018-02-12 LAB — CUP PACEART INCLINIC DEVICE CHECK
HighPow Impedance: 79 Ohm
Implantable Lead Implant Date: 20130731
Implantable Lead Serial Number: 10505793
Implantable Pulse Generator Implant Date: 20130731
Lead Channel Pacing Threshold Amplitude: 0.5 V
Lead Channel Pacing Threshold Pulse Width: 0.4 ms
Lead Channel Sensing Intrinsic Amplitude: 15.2 mV
Lead Channel Setting Pacing Pulse Width: 0.4 ms
MDC IDC LEAD LOCATION: 753860
MDC IDC LEAD MODEL: 365501
MDC IDC MSMT BATTERY VOLTAGE: 3.11 V
MDC IDC MSMT LEADCHNL RA SENSING INTR AMPL: 3.7 mV
MDC IDC MSMT LEADCHNL RV IMPEDANCE VALUE: 635 Ohm
MDC IDC SESS DTM: 20190613155400
MDC IDC SET LEADCHNL RV PACING AMPLITUDE: 2.5 V
MDC IDC SET LEADCHNL RV SENSING SENSITIVITY: 0.8 mV
MDC IDC STAT BRADY RV PERCENT PACED: 96 %
Pulse Gen Serial Number: 60687186

## 2018-03-06 LAB — CUP PACEART REMOTE DEVICE CHECK
Brady Statistic AS VP Percent: 98 %
Date Time Interrogation Session: 20190709050349
HIGH POWER IMPEDANCE MEASURED VALUE: 92 Ohm
Implantable Lead Implant Date: 20130731
Implantable Lead Serial Number: 10505793
Implantable Pulse Generator Implant Date: 20130731
Lead Channel Pacing Threshold Amplitude: 0.6 V
Lead Channel Pacing Threshold Pulse Width: 0.4 ms
Lead Channel Setting Sensing Sensitivity: 0.8 mV
MDC IDC LEAD LOCATION: 753860
MDC IDC LEAD MODEL: 365501
MDC IDC MSMT BATTERY REMAINING PERCENTAGE: 56 %
MDC IDC MSMT BATTERY VOLTAGE: 3.11 V
MDC IDC MSMT LEADCHNL RV IMPEDANCE VALUE: 603 Ohm
MDC IDC SET LEADCHNL RV PACING AMPLITUDE: 2.5 V
MDC IDC SET LEADCHNL RV PACING PULSEWIDTH: 0.4 ms
MDC IDC STAT BRADY AS VS PERCENT: 0 %
MDC IDC STAT BRADY RV PERCENT PACED: 97 %
Pulse Gen Serial Number: 60687186

## 2018-05-10 ENCOUNTER — Ambulatory Visit (INDEPENDENT_AMBULATORY_CARE_PROVIDER_SITE_OTHER): Payer: Medicare HMO | Admitting: *Deleted

## 2018-05-10 DIAGNOSIS — I442 Atrioventricular block, complete: Secondary | ICD-10-CM | POA: Diagnosis not present

## 2018-05-10 NOTE — Progress Notes (Signed)
Remote ICD transmission.   

## 2018-05-29 ENCOUNTER — Telehealth: Payer: Self-pay | Admitting: *Deleted

## 2018-05-29 NOTE — Telephone Encounter (Signed)
In the doughnut hole w/ his ELIQUIS 5 MG TABS tablet [[id number]]  Would like some samples

## 2018-05-29 NOTE — Telephone Encounter (Signed)
Pt notified about samples.

## 2018-05-30 LAB — CUP PACEART REMOTE DEVICE CHECK
Battery Voltage: 3.11 V
Brady Statistic RV Percent Paced: 96 %
Date Time Interrogation Session: 20191001055312
HighPow Impedance: 89 Ohm
Implantable Lead Implant Date: 20130731
Implantable Lead Location: 753860
Implantable Lead Model: 365501
Lead Channel Setting Pacing Amplitude: 2.5 V
Lead Channel Setting Sensing Sensitivity: 0.8 mV
MDC IDC LEAD SERIAL: 10505793
MDC IDC MSMT BATTERY REMAINING PERCENTAGE: 53 %
MDC IDC MSMT LEADCHNL RV IMPEDANCE VALUE: 584 Ohm
MDC IDC MSMT LEADCHNL RV PACING THRESHOLD AMPLITUDE: 0.6 V
MDC IDC MSMT LEADCHNL RV PACING THRESHOLD PULSEWIDTH: 0.4 ms
MDC IDC PG IMPLANT DT: 20130731
MDC IDC PG SERIAL: 60687186
MDC IDC SET LEADCHNL RV PACING PULSEWIDTH: 0.4 ms
MDC IDC STAT BRADY AS VP PERCENT: 96 %
MDC IDC STAT BRADY AS VS PERCENT: 1 %

## 2018-06-18 ENCOUNTER — Telehealth: Payer: Self-pay | Admitting: Internal Medicine

## 2018-06-18 NOTE — Telephone Encounter (Signed)
Patient is requesting samples of Eliquis 5mg . / tg

## 2018-06-19 NOTE — Telephone Encounter (Signed)
Patient assistance form for Eliquis along with 2 boxes of Eliquis 5 mg placed a front desk for patient pick up.

## 2018-07-13 ENCOUNTER — Telehealth: Payer: Self-pay | Admitting: Internal Medicine

## 2018-07-13 NOTE — Telephone Encounter (Signed)
Pt notified I will supply him with 1 week of samples. I informed pt we dont have enough to supply until end of year, he will have to pick it up from pharmacy. He voiced understanding.

## 2018-07-13 NOTE — Telephone Encounter (Signed)
Patient calling the office for samples of medication:   1.  What medication and dosage are you requesting samples for? Eliquis  2.  Are you currently out of this medication? yes    

## 2018-07-20 NOTE — Telephone Encounter (Signed)
Follow up  Patient calling the office for samples of medication:   1.  What medication and dosage are you requesting samples for? Eliquis   2.  Are you currently out of this medication? yes

## 2018-08-09 ENCOUNTER — Ambulatory Visit (INDEPENDENT_AMBULATORY_CARE_PROVIDER_SITE_OTHER): Payer: Medicare HMO

## 2018-08-09 DIAGNOSIS — I255 Ischemic cardiomyopathy: Secondary | ICD-10-CM | POA: Diagnosis not present

## 2018-08-10 ENCOUNTER — Encounter: Payer: Self-pay | Admitting: Cardiology

## 2018-08-10 NOTE — Progress Notes (Signed)
Remote ICD transmission.   

## 2018-09-22 LAB — CUP PACEART REMOTE DEVICE CHECK
Date Time Interrogation Session: 20200125172431
Implantable Lead Implant Date: 20130731
Implantable Lead Location: 753860
MDC IDC LEAD MODEL: 365501
MDC IDC LEAD SERIAL: 10505793
MDC IDC PG IMPLANT DT: 20130731
Pulse Gen Serial Number: 60687186

## 2018-11-08 ENCOUNTER — Encounter: Payer: Medicare HMO | Admitting: *Deleted

## 2018-11-16 ENCOUNTER — Other Ambulatory Visit: Payer: Self-pay

## 2018-11-16 ENCOUNTER — Encounter: Payer: Medicare HMO | Admitting: *Deleted

## 2018-11-28 LAB — CUP PACEART REMOTE DEVICE CHECK
Date Time Interrogation Session: 20200402111226
Implantable Lead Implant Date: 20130731
Implantable Lead Location: 753860
Implantable Lead Model: 365501
Implantable Lead Serial Number: 10505793
Implantable Pulse Generator Implant Date: 20130731
Pulse Gen Serial Number: 60687186

## 2018-11-29 ENCOUNTER — Ambulatory Visit (INDEPENDENT_AMBULATORY_CARE_PROVIDER_SITE_OTHER): Payer: Medicare HMO | Admitting: *Deleted

## 2018-11-29 ENCOUNTER — Other Ambulatory Visit: Payer: Self-pay

## 2018-11-29 DIAGNOSIS — I255 Ischemic cardiomyopathy: Secondary | ICD-10-CM

## 2018-12-07 ENCOUNTER — Encounter: Payer: Self-pay | Admitting: Cardiology

## 2018-12-07 NOTE — Progress Notes (Signed)
Remote ICD transmission.   

## 2019-02-04 ENCOUNTER — Telehealth: Payer: Self-pay | Admitting: Internal Medicine

## 2019-02-04 NOTE — Telephone Encounter (Signed)
New message    Spoke w/ pt about appt on 06.16.20 with Dr. Ladona Ridgel. Pt changed to virtual visit over telephone call. Pt phone number is listed in appt notes.      Virtual Visit Pre-Appointment Phone Call  "(Name), I am calling you today to discuss your upcoming appointment. We are currently trying to limit exposure to the virus that causes COVID-19 by seeing patients at home rather than in the office."  1. "What is the BEST phone number to call the day of the visit?" - include this in appointment notes  2. Do you have or have access to (through a family member/friend) a smartphone with video capability that we can use for your visit?" a. If yes - list this number in appt notes as cell (if different from BEST phone #) and list the appointment type as a VIDEO visit in appointment notes b. If no - list the appointment type as a PHONE visit in appointment notes  3. Confirm consent - "In the setting of the current Covid19 crisis, you are scheduled for a (phone or video) visit with your provider on (date) at (time).  Just as we do with many in-office visits, in order for you to participate in this visit, we must obtain consent.  If you'd like, I can send this to your mychart (if signed up) or email for you to review.  Otherwise, I can obtain your verbal consent now.  All virtual visits are billed to your insurance company just like a normal visit would be.  By agreeing to a virtual visit, we'd like you to understand that the technology does not allow for your provider to perform an examination, and thus may limit your provider's ability to fully assess your condition. If your provider identifies any concerns that need to be evaluated in person, we will make arrangements to do so.  Finally, though the technology is pretty good, we cannot assure that it will always work on either your or our end, and in the setting of a video visit, we may have to convert it to a phone-only visit.  In either situation,  we cannot ensure that we have a secure connection.  Are you willing to proceed?" STAFF: Did the patient verbally acknowledge consent to telehealth visit? Document YES/NO here: YES  4. Advise patient to be prepared - "Two hours prior to your appointment, go ahead and check your blood pressure, pulse, oxygen saturation, and your weight (if you have the equipment to check those) and write them all down. When your visit starts, your provider will ask you for this information. If you have an Apple Watch or Kardia device, please plan to have heart rate information ready on the day of your appointment. Please have a pen and paper handy nearby the day of the visit as well."  5. Give patient instructions for MyChart download to smartphone OR [email or web address] as below if video visit (depending on what platform provider is using)  6. Inform patient they will receive a phone call 15 minutes prior to their appointment time (may be from unknown caller ID) so they should be prepared to answer    TELEPHONE CALL NOTE  Wayne Gordon has been deemed a candidate for a follow-up tele-health visit to limit community exposure during the Covid-19 pandemic. I spoke with the patient via phone to ensure availability of phone/video source, confirm preferred email & phone number, and discuss instructions and expectations.  I reminded Wayne Gordon to be prepared  with any vital sign and/or heart rhythm information that could potentially be obtained via home monitoring, at the time of his visit. I reminded Wayne Gordon to expect a phone call prior to his visit.  [place] Harriette Ohara 02/04/2019 3:30 PM   INSTRUCTIONS FOR DOWNLOADING THE MYCHART APP TO SMARTPHONE  - The patient must first make sure to have activated MyChart and know their login information - If Apple, go to [employer] and type in MyChart in the search bar and download the app. If Android, ask patient to go to [employer] and type in [place] in the search  bar and download the app. The app is free but as with any other app downloads, their phone may require them to verify saved payment information or Apple/Android password.  - The patient will need to then log into the app with their MyChart username and password, and select [facility] as their healthcare provider to link the account. When it is time for your visit, go to the MyChart app, find appointments, and click Begin Video Visit. Be sure to Select Allow for your device to access the Microphone and Camera for your visit. You will then be connected, and your provider will be with you shortly.  **If they have any issues connecting, or need assistance please contact MyChart service desk [phone])**  **If using a computer, in order to ensure the best quality for their visit they will need to use either of the following Internet Browsers: [employer], or Google Chrome**  IF USING DOXIMITY or DOXY.ME - The patient will receive a link just prior to their visit by text.     FULL LENGTH CONSENT FOR TELE-HEALTH VISIT   I hereby voluntarily request, consent and authorize [facility] and its employed or contracted physicians, physician assistants, nurse practitioners or other licensed health care professionals (the Practitioner), to provide me with telemedicine health care services (the Services") as deemed necessary by the treating Practitioner. I acknowledge and consent to receive the Services by the Practitioner via telemedicine. I understand that the telemedicine visit will involve communicating with the Practitioner through live audiovisual communication technology and the disclosure of certain medical information by electronic transmission. I acknowledge that I have been given the opportunity to request an in-person assessment or other available alternative prior to the telemedicine visit and am voluntarily participating in the telemedicine visit.  I understand that I have the  right to withhold or withdraw my consent to the use of telemedicine in the course of my care at any time, without affecting my right to future care or treatment, and that the Practitioner or I may terminate the telemedicine visit at any time. I understand that I have the right to inspect all information obtained and/or recorded in the course of the telemedicine visit and may receive copies of available information for a reasonable fee.  I understand that some of the potential risks of receiving the Services via telemedicine include:   Delay or interruption in medical evaluation due to technological equipment failure or disruption;  Information transmitted may not be sufficient (e.g. poor resolution of images) to allow for appropriate medical decision making by the Practitioner; and/or   In rare instances, security protocols could fail, causing a breach of personal health information.  Furthermore, I acknowledge that it is my responsibility to provide information about my medical history, conditions and care that is complete and accurate to the best of my ability. I acknowledge that Practitioner's advice, recommendations,  and/or decision may be based on factors not within their control, such as incomplete or inaccurate data provided by me or distortions of diagnostic images or specimens that may result from electronic transmissions. I understand that the practice of medicine is not an exact science and that Practitioner makes no warranties or guarantees regarding treatment outcomes. I acknowledge that I will receive a copy of this consent concurrently upon execution via email to the email address I last provided but may also request a printed copy by calling the office of [facility].    I understand that my insurance will be billed for this visit.   I have read or had this consent read to me.  I understand the contents of this consent, which adequately explains the benefits and risks of the Services  being provided via telemedicine.   I have been provided ample opportunity to ask questions regarding this consent and the Services and have had my questions answered to my satisfaction.  I give my informed consent for the services to be provided through the use of telemedicine in my medical care  By participating in this telemedicine visit I agree to the above.

## 2019-02-12 ENCOUNTER — Telehealth (INDEPENDENT_AMBULATORY_CARE_PROVIDER_SITE_OTHER): Payer: Medicare HMO | Admitting: Internal Medicine

## 2019-02-12 ENCOUNTER — Other Ambulatory Visit: Payer: Self-pay

## 2019-02-12 DIAGNOSIS — I442 Atrioventricular block, complete: Secondary | ICD-10-CM | POA: Diagnosis not present

## 2019-02-12 DIAGNOSIS — I5022 Chronic systolic (congestive) heart failure: Secondary | ICD-10-CM

## 2019-02-12 DIAGNOSIS — Z95810 Presence of automatic (implantable) cardiac defibrillator: Secondary | ICD-10-CM | POA: Diagnosis not present

## 2019-02-12 NOTE — Progress Notes (Signed)
Electrophysiology TeleHealth Note   Due to national recommendations of social distancing due to COVID 19, an audio/video telehealth visit is felt to be most appropriate for this patient at this time.  See MyChart message from today for the patient's consent to telehealth for [facility].   Date:  02/12/2019   ID:  Candis Musa Star, DOB Aug 24, 1934, MRN [id number]  Location: patient's home  Provider location: [address] [place]  Evaluation Performed: Follow-up visit  PCP:  Octavio Graves, DO  Cardiologist:  No primary care provider on file. Lovena Le Electrophysiologist:  Dr Lovena Le  Chief Complaint:  "I was doing ok but have been more sob the past few days."  History of Present Illness:    MEILECH VIRTS is a 85 y.o. male who presents via audio/video conferencing for a telehealth visit today. He has a longstanding cardiomyopathy, chronic systolic heart failure, s/p ICD who then developed worsening of his AV conduction. He had done well until a few days ago when he described PND and orthopnea. He denies dietary indiscretion. No syncope. No ICD shock. The patient denies symptoms of fevers, chills, cough, or new SOB worrisome for COVID 19.  Past Medical History:  Diagnosis Date  . Allergy to ACE inhibitors    Angioedema many years ago; patient has tolerated Losartan (ARB) in the past - it was held during [facility] for GI bleed and worsening renal function >> resume Losartan 25 mg QD 06/2015  . Arthritis   . Atrial fibrillation (HCC)    PAF, CHADs2Vasc = 5  . Carotid artery disease (HCC)    15-17% LICA  . CHF (congestive heart failure) ([place])   . Coronary artery disease   . Diabetes mellitus   . GERD (gastroesophageal reflux disease)   . Hyperlipidemia   . Hypertension   . ICD (implantable cardiac defibrillator) in place 03/28/2012   Biotronik, Dr. Lovena Le 03/28/12  . Ischemic cardiomyopathy    S/P CABG x 3; EF 25%  . Mitral regurgitation    S/P mitral valve repair  2013  . Myocardial infarction ([place])   . Renal insufficiency     Past Surgical History:  Procedure Laterality Date  . CHOLECYSTECTOMY N/A 09/20/2017   Procedure: LAPAROSCOPIC SUBTOTAL CHOLECYSTECTOMY;  Surgeon: Ralene Ok, MD;  Location: [place];  Service: General;  Laterality: N/A;  . COLONOSCOPY N/A 04/24/2014   RMR Pancolonic diverticulosis  . CORONARY ARTERY BYPASS GRAFT  10/17/2011   Procedure: CORONARY ARTERY BYPASS GRAFTING (CABG);  Surgeon: Rexene Alberts, MD;  Location: [place];  Service: Open Heart Surgery;  Laterality: N/A;  . ERCP N/A 09/29/2017   Procedure: ENDOSCOPIC RETROGRADE CHOLANGIOPANCREATOGRAPHY (ERCP);  Surgeon: Carol Ada, MD;  Location: [place];  Service: Endoscopy;  Laterality: N/A;  . ESOPHAGOGASTRODUODENOSCOPY N/A 04/24/2014   RMR Subtle nodularity the gastric mucosa of uncertain significance-status post biopsy. Hiatal hernia. chronic inflammation, no H.pylori  . ESOPHAGOGASTRODUODENOSCOPY N/A 06/24/2016   Dr. Gala Romney: normal esophagus, small hiatal hernia, normal duodenum  . ICD  03/28/2012  . IMPLANTABLE CARDIOVERTER DEFIBRILLATOR IMPLANT N/A 03/28/2012   Procedure: IMPLANTABLE CARDIOVERTER DEFIBRILLATOR IMPLANT;  Surgeon: Evans Lance, MD;  Location: [facility] CATH LAB;  Service: Cardiovascular;  Laterality: N/A;  . KNEE ARTHROSCOPY  ~ 2008   right  . LEFT AND RIGHT HEART CATHETERIZATION WITH CORONARY ANGIOGRAM N/A 10/12/2011   Procedure: LEFT AND RIGHT HEART CATHETERIZATION WITH CORONARY ANGIOGRAM;  Surgeon: Wellington Hampshire, MD;  Location: [place] CATH LAB;  Service: Cardiovascular;  Laterality: N/A;  .  MITRAL VALVE REPAIR  10/17/2011   Procedure: MITRAL VALVE REPAIR (MVR);  Surgeon: Purcell Nailslarence H Owen, MD;  [facility];  Service: Open Heart Surgery;  Laterality: N/A;  . NASAL SINUS SURGERY  1990's   right    Current Outpatient Medications  Medication Sig Dispense Refill  . CALCIUM-MAGNESIUM-ZINC PO Take 2 tablets by mouth daily.    . carvedilol (COREG) 12.5 MG  tablet Take 12.5 mg by mouth 2 (two) times daily.    [name] Kitchen. ELIQUIS 5 MG TABS tablet Take 2.5 mg by mouth 2 (two) times daily.    . furosemide (LASIX) 20 MG tablet Take 1 tablet (20 mg total) by mouth daily. 90 tablet 3  . glipiZIDE (GLUCOTROL XL) 10 MG 24 hr tablet Take 10 mg by mouth 2 times daily at 12 noon and 4 pm. Pt takes it in the AM and PM    . hydrALAZINE (APRESOLINE) 25 MG tablet Take 25 mg by mouth 3 (three) times daily as needed (for bp levels).    . Insulin Glargine (TOUJEO SOLOSTAR) 300 UNIT/ML SOPN Inject 12-15 Units as directed daily.     [name] Kitchen. losartan (COZAAR) 25 MG tablet Take 1 tablet (25 mg total) by mouth daily. 30 tablet 11  . Multiple Vitamins-Minerals (MULTIVITAMINS THER. W/MINERALS) TABS Take 1 tablet by mouth daily.    . pantoprazole (PROTONIX) 40 MG tablet Take 1 tablet by mouth daily.    . rosuvastatin (CRESTOR) 20 MG tablet Take 20 mg by mouth every evening.     . testosterone cypionate (DEPO-TESTOSTERONE) 200 MG/ML injection Inject 0.5 mg into the muscle every 21 ( twenty-one) days.      No current facility-administered medications for this visit.     Allergies:   Ace inhibitors and Penicillins   Social History:  The patient  reports that he has never smoked. He has never used smokeless tobacco. He reports that he does not drink alcohol or use drugs.   Family History:  The patient's  family history includes Cardiomyopathy in his father; Diabetes in his father; Heart disease in his mother.   ROS:  Please see the history of present illness.   All other systems are personally reviewed and negative.    Exam:    Vital Signs:  bp-117/70, P - 60   Labs/Other Tests and Data Reviewed:    Recent Labs: No results found for requested labs within last 8760 hours.   Wt Readings from Last 3 Encounters:  02/08/18 238 lb (108 kg)  09/30/17 246 lb 14.6 oz (112 kg)  09/19/17 238 lb (108 kg)     Other studies personally reviewed: Additional studies/ records that were  reviewed today include:   Last device remote is reviewed from PaceART PDF dated 11/29/18 which reveals normal device function, no arrhythmias    ASSESSMENT & PLAN:    1.  Chronic systolic heart failure - his symptoms have worsened recently and I have asked him to take an extra lasix for the next 3 days. He is encouraged to avoid salty foods. If he does not improve, he will need to come in to be seen. 2. ICD - his Biotronik VDD device is working normally.  3. CHB - he is pacing in the RV and I suspect that this will lead to worsening CHF. We will address this further when I see him back in the office.  4. COVID 19 screen The patient denies symptoms of COVID 19 at this time.  The importance of social distancing  was discussed today.  Follow-up:  3 months Next remote: 7/20  Current medicines are reviewed at length with the patient today.   The patient does not have concerns regarding his medicines.  The following changes were made today:  none  Labs/ tests ordered today include: none No orders of the defined types were placed in this encounter.    Patient Risk:  after full review of this patients clinical status, I feel that they are at moderate risk at this time.  Today, I have spent 15 minutes with the patient with telehealth technology discussing all of the above .    Signed, Lewayne Bunting, MD  02/12/2019 1:47 PM     [facility] [address] [place] [address] [phone] (office) [phone] (fax)

## 2019-02-28 ENCOUNTER — Ambulatory Visit (INDEPENDENT_AMBULATORY_CARE_PROVIDER_SITE_OTHER): Payer: Medicare HMO | Admitting: *Deleted

## 2019-02-28 DIAGNOSIS — I442 Atrioventricular block, complete: Secondary | ICD-10-CM

## 2019-02-28 LAB — CUP PACEART REMOTE DEVICE CHECK
Battery Remaining Percentage: 45 %
Battery Voltage: 3.09 V
Brady Statistic RV Percent Paced: 97 %
Date Time Interrogation Session: 20200702092320
HighPow Impedance: 88 Ohm
Implantable Lead Implant Date: 20130731
Implantable Lead Location: 753860
Implantable Lead Model: 365501
Implantable Lead Serial Number: 10505793
Implantable Pulse Generator Implant Date: 20130731
Lead Channel Impedance Value: 621 Ohm
Lead Channel Pacing Threshold Amplitude: 0.6 V
Lead Channel Pacing Threshold Pulse Width: 0.4 ms
Lead Channel Sensing Intrinsic Amplitude: 3.8 mV
Lead Channel Setting Pacing Amplitude: 2.5 V
Lead Channel Setting Pacing Pulse Width: 0.4 ms
Lead Channel Setting Sensing Sensitivity: 0.8 mV
Pulse Gen Serial Number: 60687186

## 2019-03-08 ENCOUNTER — Encounter: Payer: Self-pay | Admitting: Cardiology

## 2019-03-08 NOTE — Progress Notes (Signed)
Remote ICD transmission.   

## 2019-04-08 ENCOUNTER — Telehealth: Payer: Self-pay | Admitting: Emergency Medicine

## 2019-04-08 NOTE — Telephone Encounter (Signed)
Alert received for episodes of AF. Current episode ongoing. Pt reports no SOB, no cp, no chest pressure and no increased LE edema , and no cough. Pt has been taking his Eliquis and Coreg as prescribed.

## 2019-04-11 ENCOUNTER — Telehealth: Payer: Self-pay | Admitting: *Deleted

## 2019-04-11 NOTE — Telephone Encounter (Signed)
   [facility] Pre-operative Risk Assessment    Request for surgical clearance:  1. What type of surgery is being performed? CATARACT EXTRACTION   2. When is this surgery scheduled? 04/22/19   3. What type of clearance is required (medical clearance vs. Pharmacy clearance to hold med vs. Both)? MEDICAL  4. Are there any medications that need to be held prior to surgery and how long? NONE PER DR. Harrell Gave [facility]    5. Practice name and name of physician performing surgery? [facility]; DR. Harrell Gave [facility]    6. What is your office phone number [phone]    7.   What is your office fax number [phone]  8.   Anesthesia type (None, local, MAC, general) ? IV SEDATION   Julaine Hua 04/11/2019, 11:22 AM  _________________________________________________________________   (provider comments below)

## 2019-04-11 NOTE — Telephone Encounter (Signed)
   Primary Cardiologist: Dr. Cristopher Peru, MD  Chart reviewed as part of pre-operative protocol coverage. Patient was contacted 04/11/2019 in reference to pre-operative risk assessment for pending surgery as outlined below.  Wayne Gordon was last seen on 02/12/2019 by Dr. Lovena Le.  Since that day, Wayne Gordon has done well from a cardiac perspective.  He states he has had no recurrence of shortness of breath after taking 3 days of extra Lasix after his last appointment.  He denies anginal symptoms, palpitations, PND, orthopnea, lower extremity edema or dizziness.  He has an upcoming follow-up appointment with Dr. Lovena Le on 05/07/2019 for which we discussed keeping.  Therefore, based on ACC/AHA guidelines, the patient would be at acceptable risk for the planned procedure without further cardiovascular testing.   I will route this recommendation to the requesting party via Epic fax function and remove from pre-op pool.  Please call with questions.  Kathyrn Drown, NP 04/11/2019, 11:40 AM

## 2019-04-14 NOTE — Telephone Encounter (Signed)
Agree with treatment as outlined. GT

## 2019-04-22 NOTE — Telephone Encounter (Signed)
Alert received for persistent AF, previously noted. As pt asymptomatic per notes, alerts for AF burden/duration turned off in Home Monitoring.

## 2019-05-07 ENCOUNTER — Other Ambulatory Visit: Payer: Self-pay

## 2019-05-07 ENCOUNTER — Ambulatory Visit (INDEPENDENT_AMBULATORY_CARE_PROVIDER_SITE_OTHER): Payer: Medicare HMO | Admitting: Internal Medicine

## 2019-05-07 ENCOUNTER — Encounter: Payer: Self-pay | Admitting: Internal Medicine

## 2019-05-07 VITALS — BP 148/82 | HR 60 | Ht 76.0 in | Wt 244.0 lb

## 2019-05-07 DIAGNOSIS — I442 Atrioventricular block, complete: Secondary | ICD-10-CM | POA: Diagnosis not present

## 2019-05-07 DIAGNOSIS — I480 Paroxysmal atrial fibrillation: Secondary | ICD-10-CM

## 2019-05-07 DIAGNOSIS — Z95810 Presence of automatic (implantable) cardiac defibrillator: Secondary | ICD-10-CM | POA: Diagnosis not present

## 2019-05-07 NOTE — Patient Instructions (Signed)
Medication Instructions:  none If you need a refill on your cardiac medications before your next appointment, please call your pharmacy.   Lab work: none If you have labs (blood work) drawn today and your tests are completely normal, you will receive your results only by: . MyChart Message (if you have MyChart) OR . A paper copy in the mail If you have any lab test that is abnormal or we need to change your treatment, we will call you to review the results.  Testing/Procedures: none  Follow-Up: At [facility], you and your health needs are our priority.  As part of our continuing mission to provide you with exceptional heart care, we have created designated Provider Care Teams.  These Care Teams include your primary Cardiologist (physician) and Advanced Practice Providers (APPs -  Physician Assistants and Nurse Practitioners) who all work together to provide you with the care you need, when you need it. You will need a follow up appointment in 1 years.  Please call our office 2 months in advance to schedule this appointment.  You may see Dr Taylor or one of the following Advanced Practice Providers on your designated Care Team:   Amber Seiler, NP . Renee Ursuy, PA-C  Any Other Special Instructions Will Be Listed Below (If Applicable).    

## 2019-05-07 NOTE — Progress Notes (Signed)
HPI Wayne Gordon is a 85 y.o. male who returns tof evaluation of the problems as noted below. He has a longstanding cardiomyopathy, chronic systolic heart failure, s/p ICD who then developed worsening of his AV conduction. He denies dietary indiscretion. No syncope. No ICD shock. He has reverted to atrial fib over the past 2 months.    Current Outpatient Medications  Medication Sig Dispense Refill  . CALCIUM-MAGNESIUM-ZINC PO Take 2 tablets by mouth daily.    . carvedilol (COREG) 12.5 MG tablet Take 12.5 mg by mouth 2 (two) times daily.    [name] Kitchen ELIQUIS 5 MG TABS tablet Take 2.5 mg by mouth 2 (two) times daily.    . furosemide (LASIX) 20 MG tablet Take 1 tablet (20 mg total) by mouth daily. 90 tablet 3  . glipiZIDE (GLUCOTROL XL) 10 MG 24 hr tablet Take 10 mg by mouth 2 times daily at 12 noon and 4 pm. Pt takes it in the AM and PM    . hydrALAZINE (APRESOLINE) 25 MG tablet Take 25 mg by mouth 3 (three) times daily as needed (for bp levels).    . Insulin Glargine (TOUJEO SOLOSTAR) 300 UNIT/ML SOPN Inject 12-15 Units as directed daily.     [name] Kitchen losartan (COZAAR) 25 MG tablet Take 1 tablet (25 mg total) by mouth daily. 30 tablet 11  . Multiple Vitamin (THERA) TABS Take 1 tablet by mouth daily.    . Multiple Vitamins-Minerals (MULTIVITAMINS THER. W/MINERALS) TABS Take 1 tablet by mouth daily.    . pantoprazole (PROTONIX) 40 MG tablet Take 1 tablet by mouth daily.    . rosuvastatin (CRESTOR) 20 MG tablet Take 20 mg by mouth every evening.     . testosterone cypionate (DEPO-TESTOSTERONE) 200 MG/ML injection Inject 0.5 mg into the muscle every 21 ( twenty-one) days.      No current facility-administered medications for this visit.      Past Medical History:  Diagnosis Date  . Allergy to ACE inhibitors    Angioedema many years ago; patient has tolerated Losartan (ARB) in the past - it was held during [facility] for GI bleed and worsening renal function >> resume Losartan 25 mg QD 06/2015  .  Arthritis   . Atrial fibrillation (HCC)    PAF, CHADs2Vasc = 5  . Carotid artery disease (HCC)    60-79% LICA  . CHF (congestive heart failure) (HCC)   . Coronary artery disease   . Diabetes mellitus   . GERD (gastroesophageal reflux disease)   . Hyperlipidemia   . Hypertension   . ICD (implantable cardiac defibrillator) in place 03/28/2012   Biotronik, Dr. Ladona Ridgel 03/28/12  . Ischemic cardiomyopathy    S/P CABG x 3; EF 25%  . Mitral regurgitation    S/P mitral valve repair 2013  . Myocardial infarction (HCC)   . Renal insufficiency     ROS:   All systems reviewed and negative except as noted in the HPI.   Past Surgical History:  Procedure Laterality Date  . CHOLECYSTECTOMY N/A 09/20/2017   Procedure: LAPAROSCOPIC SUBTOTAL CHOLECYSTECTOMY;  Surgeon: Axel Filler, MD;  Location: [facility] OR;  Service: General;  Laterality: N/A;  . COLONOSCOPY N/A 04/24/2014   RMR Pancolonic diverticulosis  . CORONARY ARTERY BYPASS GRAFT  10/17/2011   Procedure: CORONARY ARTERY BYPASS GRAFTING (CABG);  Surgeon: Purcell Nails, MD;  Location: [facility] OR;  Service: Open Heart Surgery;  Laterality: N/A;  . ERCP N/A 09/29/2017   Procedure: ENDOSCOPIC RETROGRADE CHOLANGIOPANCREATOGRAPHY (ERCP);  Surgeon: Jeani HawkingHung, Patrick, MD;  [facility];  Service: Endoscopy;  Laterality: N/A;  . ESOPHAGOGASTRODUODENOSCOPY N/A 04/24/2014   RMR Subtle nodularity the gastric mucosa of uncertain significance-status post biopsy. Hiatal hernia. chronic inflammation, no H.pylori  . ESOPHAGOGASTRODUODENOSCOPY N/A 06/24/2016   Dr. Jena Gaussourk: normal esophagus, small hiatal hernia, normal duodenum  . ICD  03/28/2012  . IMPLANTABLE CARDIOVERTER DEFIBRILLATOR IMPLANT N/A 03/28/2012   Procedure: IMPLANTABLE CARDIOVERTER DEFIBRILLATOR IMPLANT;  Surgeon: Marinus MawGregg W Taylor, MD;  [facility];  Service: Cardiovascular;  Laterality: N/A;  . KNEE ARTHROSCOPY  ~ 2008   right  . LEFT AND RIGHT HEART CATHETERIZATION WITH CORONARY ANGIOGRAM  N/A 10/12/2011   Procedure: LEFT AND RIGHT HEART CATHETERIZATION WITH CORONARY ANGIOGRAM;  Surgeon: Iran OuchMuhammad A Arida, MD;  [facility];  Service: Cardiovascular;  Laterality: N/A;  . MITRAL VALVE REPAIR  10/17/2011   Procedure: MITRAL VALVE REPAIR (MVR);  Surgeon: Purcell Nailslarence H Owen, MD;  [facility];  Service: Open Heart Surgery;  Laterality: N/A;  . NASAL SINUS SURGERY  1990's   right     Family History  Problem Relation Age of Onset  . Heart disease Mother   . Diabetes Father   . Cardiomyopathy Father   . Colon cancer Neg Hx      Social History   Socioeconomic History  . Marital status: Married    Spouse name: Not on file  . Number of children: Not on file  . Years of education: Not on file  . Highest education level: Not on file  Occupational History  . Occupation: Retired  Engineer, productionocial Needs  . Financial resource strain: Not on file  . Food insecurity    Worry: Not on file    Inability: Not on file  . Transportation needs    Medical: Not on file    Non-medical: Not on file  Tobacco Use  . Smoking status: Never Smoker  . Smokeless tobacco: Never Used  Substance and Sexual Activity  . Alcohol use: No    Alcohol/week: 0.0 standard drinks  . Drug use: No  . Sexual activity: Yes  Lifestyle  . Physical activity    Days per week: Not on file    Minutes per session: Not on file  . Stress: Not on file  Relationships  . Social Musicianconnections    Talks on phone: Not on file    Gets together: Not on file    Attends religious service: Not on file    Active member of club or organization: Not on file    Attends meetings of clubs or organizations: Not on file    Relationship status: Not on file  . Intimate partner violence    Fear of current or ex partner: Not on file    Emotionally abused: Not on file    Physically abused: Not on file    Forced sexual activity: Not on file  Other Topics Concern  . Not on file  Social History Narrative  . Not on file     BP  (!) 148/82   Pulse 60   Ht 6\' 4"  (1.93 m)   Wt 244 lb (110.7 kg)   SpO2 98%   BMI 29.70 kg/m   Physical Exam:  Well appearing NAD HEENT: Unremarkable Neck:  No JVD, no thyromegally Lymphatics:  No adenopathy Back:  No CVA tenderness Lungs:  Clear with no wheezes HEART:  Regular rate rhythm, no murmurs, no rubs, no clicks Abd:  soft, positive bowel sounds, no  organomegally, no rebound, no guarding Ext:  2 plus pulses, no edema, no cyanosis, no clubbing Skin:  No rashes no nodules Neuro:  CN II through XII intact, motor grossly intact  EKG - atrial fib with ventricular pacing  DEVICE  Normal device function.  See PaceArt for details.   Assess/Plan: 1. Atrial fib with a controled VR -  He will continue his current meds as he is asymptomatic. Continue systemic anticoagulation. 2. CHB - he is pacing in the ventricle almost 100% of the time. 3. ICD - his Biotronik VDD device is working normally. His symptoms are class 2. 4. Back pain - this is his biggest limitation. He is in the process of getting evaluated/treated.   Mikle Bosworth.D.

## 2019-05-17 NOTE — Addendum Note (Signed)
Addended by: Rose Phi on: 05/17/2019 02:54 PM   Modules accepted: Orders

## 2019-05-30 ENCOUNTER — Encounter: Payer: Medicare HMO | Admitting: *Deleted

## 2019-08-02 ENCOUNTER — Other Ambulatory Visit: Payer: Self-pay

## 2019-08-02 ENCOUNTER — Telehealth: Payer: Self-pay | Admitting: Emergency Medicine

## 2019-08-02 MED ORDER — CARVEDILOL 12.5 MG PO TABS: ORAL_TABLET | ORAL | 3 refills | Status: DC

## 2019-08-02 NOTE — Telephone Encounter (Signed)
Dr Lovena Le given report on patient and he ordered: 1. Increase Lasix to 40 mg daily 08/02/19( Friday) to 08/04/19(Sunday) 2. Coreg 12.5 mg po three times a day 3. Eliquis samples x 2 weeks for 2.5 mg twice a day as ordered and provide info on financial assistance. 4. Patient to contact clinc Monday 08/05/19 to get update on his condition. Above orders given to patient. Stressed importance on consistnt medication doses. Patient will come by [facility] street office to pick up samples of Eliquis.

## 2019-08-02 NOTE — Telephone Encounter (Signed)
Patient reports increased SOB over the past 2 days with activity. Denies CP, dizziness, or palpitations.  Reports he has been taking Coreg 12.5 mg twice a day but has added an additional dose of 12.5 mg a day mid day intermittently because he had been on Coreg 25 mg BID previously. Dose of Coreg was decreased because of side effects and he added a tablet back just to see how he felt. Stressed importance of consistent medication dosages and to contact the office before he changes med doses to discuss with the DR. He has also changed his Eliquis dose to 2.5 mg twice a day because he can not afford the medication. He will be out of Eliquis by Monday. Will make Dr Lovena Le aware of the issues with meds  And cost.

## 2019-08-02 NOTE — Telephone Encounter (Signed)
LMOM. Patient in ongoing AF with controlled v-rates since 08/01/19 @ 2:24 pm. Assess for symptoms. + Eliquis.

## 2019-08-02 NOTE — Progress Notes (Signed)
Coreg dose changed and sent to [facility] per Dr. Lovena Le.

## 2019-08-05 NOTE — Telephone Encounter (Signed)
Wife answered phone and reports patient is feeling better. Alert received today for AF ongoing since 08/04/19. Will have patient call office .

## 2019-08-05 NOTE — Telephone Encounter (Signed)
No change in treatment. Watchful waiting. GT

## 2019-08-19 ENCOUNTER — Telehealth: Payer: Self-pay | Admitting: Internal Medicine

## 2019-08-19 ENCOUNTER — Telehealth: Payer: Self-pay

## 2019-08-19 NOTE — Telephone Encounter (Signed)
Pt Asst paperwork for Eliquis has been completed and put in Dr Forde Dandy box for signature. Will fax to [employer] as soon as we get it back with signature.

## 2019-08-19 NOTE — Telephone Encounter (Signed)
Patient is calling to check on the status of his Eliquis paperwork he recently faxed over and still hasn't heard anything back in regards to.

## 2019-08-19 NOTE — Telephone Encounter (Signed)
**Note De-Identified Wayne Gordon Obfuscation** The pt is advised that we have received his [employer] pt asst application and that we are waiting on Dr Forde Dandy signature and that we plan to fax it to [employer] on Thursday this week.  He verbalized understanding and thanked me for calling him with update.

## 2019-08-20 NOTE — Telephone Encounter (Signed)
Faxed completed paperwork to [employer].

## 2019-08-29 ENCOUNTER — Ambulatory Visit (INDEPENDENT_AMBULATORY_CARE_PROVIDER_SITE_OTHER): Payer: Medicare HMO | Admitting: *Deleted

## 2019-08-29 DIAGNOSIS — Z95810 Presence of automatic (implantable) cardiac defibrillator: Secondary | ICD-10-CM

## 2019-08-29 LAB — CUP PACEART REMOTE DEVICE CHECK
Date Time Interrogation Session: 20201231130016
Implantable Lead Implant Date: 20130731
Implantable Lead Location: 753860
Implantable Lead Model: 365501
Implantable Lead Serial Number: 10505793
Implantable Pulse Generator Implant Date: 20130731
Pulse Gen Serial Number: 60687186

## 2019-08-30 NOTE — Progress Notes (Signed)
ICD remote 

## 2019-11-28 ENCOUNTER — Ambulatory Visit (INDEPENDENT_AMBULATORY_CARE_PROVIDER_SITE_OTHER): Payer: Medicare HMO | Admitting: *Deleted

## 2019-11-28 DIAGNOSIS — Z95810 Presence of automatic (implantable) cardiac defibrillator: Secondary | ICD-10-CM

## 2019-11-28 LAB — CUP PACEART REMOTE DEVICE CHECK
Battery Remaining Percentage: 36 %
Battery Voltage: 3.05 V
Brady Statistic RV Percent Paced: 98 %
Date Time Interrogation Session: 20210401111449
HighPow Impedance: 84 Ohm
Implantable Lead Implant Date: 20130731
Implantable Lead Location: 753860
Implantable Lead Model: 365
Implantable Lead Serial Number: 10505793
Implantable Pulse Generator Implant Date: 20130731
Lead Channel Impedance Value: 563 Ohm
Lead Channel Sensing Intrinsic Amplitude: 2.4 mV
Lead Channel Setting Pacing Amplitude: 2.5 V
Lead Channel Setting Pacing Pulse Width: 0.4 ms
Lead Channel Setting Sensing Sensitivity: 0.8 mV
Pulse Gen Serial Number: 60687186

## 2019-11-29 NOTE — Progress Notes (Signed)
ICD Remote  

## 2019-12-27 ENCOUNTER — Telehealth: Payer: Self-pay | Admitting: Internal Medicine

## 2019-12-27 NOTE — Telephone Encounter (Signed)
Patient applied for assistance through [employer] back in December. He has not heard anything about his application since the paperwork was dropped off. Please call

## 2019-12-30 ENCOUNTER — Telehealth: Payer: Self-pay | Admitting: Internal Medicine

## 2019-12-30 NOTE — Telephone Encounter (Signed)
Patient calling to speak with Larita Fife, see phone note from 12/27/19. He states that Eliquis did not receive the fax and asked to refax form to [phone]

## 2019-12-30 NOTE — Telephone Encounter (Signed)
**Note De-Identified Wayne Gordon Obfuscation** The pt is advised to contact [employer] pt asst program at [phone] to see if application we faxed to them in 07/2019 will work for thjis year. The pt states that he will call me back with answser and if needed I will send them a new signed MD page of application which includes new Eliquis RX.  The pt thanked me for calling him back.

## 2019-12-31 NOTE — Telephone Encounter (Signed)
**Note De-Identified [name]** I left a detailed message on the pts VM stating that a new [employer] pt asst application will need to be completedper [employer] for this year and requested that he call [employer] at [phone] to request that they mail him an application and that once he receives it to complete his part of the application, obtain required documentation per [employer], bring all to the office to drop off and that we will take care of the MD part ofthe application and fax all to [employer] pt asst program.

## 2020-01-07 ENCOUNTER — Telehealth: Payer: Self-pay | Admitting: *Deleted

## 2020-01-07 NOTE — Telephone Encounter (Signed)
   Primary Cardiologist: Lewayne Bunting, MD  Chart reviewed as part of pre-operative protocol coverage. Cataract extractions are recognized in guidelines as low risk surgeries that do not typically require specific preoperative testing or holding of blood thinner therapy. Therefore, given past medical history and time since last visit, based on ACC/AHA guidelines, Wayne Gordon would be at acceptable risk for the planned procedure without further cardiovascular testing.   I will route this recommendation to the requesting party via Epic fax function and remove from pre-op pool.  Please call with questions.  Laurann [place], PA-C 01/07/2020, 2:44 PM

## 2020-01-07 NOTE — Telephone Encounter (Signed)
   [facility] Pre-operative Risk Assessment    Request for surgical clearance:  1. What type of surgery is being performed? CATARACT EXTRACTION BY PE, IO-RIGHT   2. When is this surgery scheduled? 01/13/20   3. What type of clearance is required (medical clearance vs. Pharmacy clearance to hold med vs. Both)? MEDICAL  4. Are there any medications that need to be held prior to surgery and how long? NO MEDICATIONS NEED TO BE HELD INCLUDING ANY BLOOD THINNERS PER DR. Harrell Gave SHAH   5. Practice name and name of physician performing surgery? [facility]; DR. Tama High   6. What is your office phone number [phone]    7.   What is your office fax number [phone] ATTN: Rinaldo Cloud, NP  8.   Anesthesia type (None, local, MAC, general) ? IV SEDATION   Julaine Hua 01/07/2020, 2:11 PM  _________________________________________________________________   (provider comments below)

## 2020-01-17 ENCOUNTER — Telehealth: Payer: Self-pay | Admitting: *Deleted

## 2020-01-17 NOTE — Telephone Encounter (Signed)
    [facility] Pre-operative Risk Assessment    HEARTCARE STAFF: - Please ensure there is not already an duplicate clearance open for this procedure. - Under Visit Info/Reason for Call, type in Other and utilize the format Clearance MM/DD/YY or Clearance TBD. Do not use dashes or single digits.  Request for surgical clearance:  1. What type of surgery is being performed? CAUDAL ESI   2. When is this surgery scheduled? TBD   3. What type of clearance is required (medical clearance vs. Pharmacy clearance to hold med vs. Both)? PHARM  4. Are there any medications that need to be held prior to surgery and how long? ELIQUIS X 3 DAYS   5. Practice name and name of physician performing surgery? [facility]; DR. DAVID O'TOOLE   6. What is the office phone number? [phone]   7.   What is the office fax number?  [phone]  8.   Anesthesia type (None, local, MAC, general) ? NOT LISTED   Wayne Gordon 01/17/2020, 4:03 PM  _________________________________________________________________   (provider comments below)

## 2020-01-21 NOTE — Telephone Encounter (Signed)
Primary Cardiologist:Gregg Ladona Ridgel, MD  Chart reviewed as part of pre-operative protocol coverage. Because of Wayne Gordon's past medical history and time since last visit, he/she will require a follow-up visit in order to better assess preoperative cardiovascular risk.  Pre-op covering staff: - Please schedule appointment and call patient to inform them. - Please contact requesting surgeon's office via preferred method (i.e, phone, fax) to inform them of need for appointment prior to surgery.  If applicable, this message will also be routed to pharmacy pool and/or primary cardiologist for input on holding anticoagulant/antiplatelet agent as requested below so that this information is available at time of patient's appointment.   Ronney Asters, NP  01/21/2020, 2:03 PM

## 2020-01-21 NOTE — Telephone Encounter (Signed)
Called pt and scheduled appt at [facility] Friday 02-07-20 pt to arrive @3pm  wearing a mask. Verbalizes understanding

## 2020-01-21 NOTE — Telephone Encounter (Signed)
Patient with diagnosis of afib on Eliquis for anticoagulation.    Procedure: caudal ESI Date of procedure: TBD  CHADS2-VASc score of 6 (age x2, CHF, HTN, DM, CAD) CrCl 62mL/min Platelet count 187K  Per office protocol, patient can hold Eliquis for 3 days prior to procedure.

## 2020-02-07 ENCOUNTER — Ambulatory Visit: Payer: Medicare HMO | Admitting: Physician Assistant

## 2020-02-07 ENCOUNTER — Other Ambulatory Visit: Payer: Self-pay

## 2020-02-07 ENCOUNTER — Encounter: Payer: Self-pay | Admitting: Physician Assistant

## 2020-02-07 VITALS — BP 140/70 | HR 60 | Ht 76.0 in | Wt 249.0 lb

## 2020-02-07 DIAGNOSIS — I255 Ischemic cardiomyopathy: Secondary | ICD-10-CM | POA: Diagnosis not present

## 2020-02-07 DIAGNOSIS — I2510 Atherosclerotic heart disease of native coronary artery without angina pectoris: Secondary | ICD-10-CM

## 2020-02-07 DIAGNOSIS — I4811 Longstanding persistent atrial fibrillation: Secondary | ICD-10-CM | POA: Diagnosis not present

## 2020-02-07 DIAGNOSIS — Z9889 Other specified postprocedural states: Secondary | ICD-10-CM

## 2020-02-07 DIAGNOSIS — I5022 Chronic systolic (congestive) heart failure: Secondary | ICD-10-CM

## 2020-02-07 DIAGNOSIS — Z01810 Encounter for preprocedural cardiovascular examination: Secondary | ICD-10-CM

## 2020-02-07 DIAGNOSIS — Z95810 Presence of automatic (implantable) cardiac defibrillator: Secondary | ICD-10-CM | POA: Diagnosis not present

## 2020-02-07 MED ORDER — APIXABAN 2.5 MG PO TABS: 2.5000 mg | ORAL_TABLET | Freq: Two times a day (BID) | ORAL | 3 refills | Status: DC

## 2020-02-07 MED ORDER — FUROSEMIDE 20 MG PO TABS: 20.0000 mg | ORAL_TABLET | Freq: Two times a day (BID) | ORAL | 3 refills | Status: DC

## 2020-02-07 NOTE — Patient Instructions (Addendum)
Medication Instructions:  none *If you need a refill on your cardiac medications before your next appointment, please call your pharmacy*   Lab Work: none If you have labs (blood work) drawn today and your tests are completely normal, you will receive your results only by: [name] Kitchen MyChart Message (if you have MyChart) OR . A paper copy in the mail If you have any lab test that is abnormal or we need to change your treatment, we will call you to review the results.   Testing/Procedures: none   Follow-Up: At [facility], you and your health needs are our priority.  As part of our continuing mission to provide you with exceptional heart care, we have created designated Provider Care Teams.  These Care Teams include your primary Cardiologist (physician) and Advanced Practice Providers (APPs -  Physician Assistants and Nurse Practitioners) who all work together to provide you with the care you need, when you need it.  Your next appointment:   6 month(s)  The format for your next appointment:   Either In Person or Virtual  Provider:   Dr Ladona Ridgel or Francis Dowse   Other Instructions Remote monitoring is used to monitor your ICD from home. This monitoring reduces the number of office visits required to check your device to one time per year. It allows [place] to keep an eye on the functioning of your device to ensure it is working properly. You are scheduled for a device check from home on 02/27/20. You may send your transmission at any time that day. If you have a wireless device, the transmission will be sent automatically. After your physician reviews your transmission, you will receive a postcard with your next transmission date.  Ok for Epidural Injection hold Eliquis 3 days  Use Support stockings, elevate feet while sitting and low salt diet

## 2020-02-07 NOTE — Progress Notes (Signed)
Cardiology Office Note Date:  02/07/2020  Patient ID:  Wayne Gordon, DOB [birth year], MRN [id number] PCP:  Scotty Court, DO  EP:  Dr. Lovena Le   Chief Complaint:    pre op   History of Present Illness: Wayne Gordon is a 86 y.o. male with history of CAD (CABG), VHD (MV repair), ICM/, chronic CHF (systolic), CD, VT, HTN, HLD, PAFib, DM, chronic back pain.   He comes in today to be seen for Dr. Lovena Le.  Last seen by him Sept 2020.  At that time discusses worsening Av conduction over time to CHB with 100% VP, and development of persistent Afib.  No changes were made.   Pending Caudal ESI (epidural steroid injection, pending schedule.  He arrived for this procedure 01/17/20, though had not held his Eliquis.  He was rescheduled pending clearance to be off his Eliquis (3 days), I did not see request for cardiac clearance.  He had COVID Dec 2020 and his back has worsened and has significantly slowed with these.  He has no CP, palpitations. He denies SOB at rest, he sleeps in bed with one pillow and denies symptoms of PND or orthopnea.  He has ankle.LE swelling that waxes and wanes, morse notable when he has been up and around more.  Better in the AMs and when he wears support stockings He has decreasing exertional capacity especially this last 6 months or so, he thinks primarily 2/2 to his terrible back pain.  He denies any near syncope or syncope  No bleeding or signs of bleeding  Eliquis is very costly for him     Device information Biotronik single lead ICD (VDD), implanted 03/28/2012 + h/o appropriate therapy, 2016, VT  AAD Hx None to date  Past Medical History:  Diagnosis Date  . Allergy to ACE inhibitors    Angioedema many years ago; patient has tolerated Losartan (ARB) in the past - it was held during [facility] for GI bleed and worsening renal function >> resume Losartan 25 mg QD 06/2015  . Arthritis   . Atrial fibrillation (HCC)    PAF, CHADs2Vasc = 5  . Carotid artery  disease (HCC)    10-17% LICA  . CHF (congestive heart failure) ([place])   . Coronary artery disease   . Diabetes mellitus   . GERD (gastroesophageal reflux disease)   . Hyperlipidemia   . Hypertension   . ICD (implantable cardiac defibrillator) in place 03/28/2012   Biotronik, Dr. Lovena Le 03/28/12  . Ischemic cardiomyopathy    S/P CABG x 3; EF 25%  . Mitral regurgitation    S/P mitral valve repair 2013  . Myocardial infarction ([place])   . Renal insufficiency     Past Surgical History:  Procedure Laterality Date  . CHOLECYSTECTOMY N/A 09/20/2017   Procedure: LAPAROSCOPIC SUBTOTAL CHOLECYSTECTOMY;  Surgeon: Ralene Ok, MD;  Location: [place];  Service: General;  Laterality: N/A;  . COLONOSCOPY N/A 04/24/2014   RMR Pancolonic diverticulosis  . CORONARY ARTERY BYPASS GRAFT  10/17/2011   Procedure: CORONARY ARTERY BYPASS GRAFTING (CABG);  Surgeon: Rexene Alberts, MD;  Location: [place];  Service: Open Heart Surgery;  Laterality: N/A;  . ERCP N/A 09/29/2017   Procedure: ENDOSCOPIC RETROGRADE CHOLANGIOPANCREATOGRAPHY (ERCP);  Surgeon: Carol Ada, MD;  Location: [place];  Service: Endoscopy;  Laterality: N/A;  . ESOPHAGOGASTRODUODENOSCOPY N/A 04/24/2014   RMR Subtle nodularity the gastric mucosa of uncertain significance-status post biopsy. Hiatal hernia. chronic inflammation, no H.pylori  . ESOPHAGOGASTRODUODENOSCOPY N/A 06/24/2016  Dr. Jena Gauss: normal esophagus, small hiatal hernia, normal duodenum  . ICD  03/28/2012  . IMPLANTABLE CARDIOVERTER DEFIBRILLATOR IMPLANT N/A 03/28/2012   Procedure: IMPLANTABLE CARDIOVERTER DEFIBRILLATOR IMPLANT;  Surgeon: Marinus Maw, MD;  Location: [facility] CATH LAB;  Service: Cardiovascular;  Laterality: N/A;  . KNEE ARTHROSCOPY  ~ 2008   right  . LEFT AND RIGHT HEART CATHETERIZATION WITH CORONARY ANGIOGRAM N/A 10/12/2011   Procedure: LEFT AND RIGHT HEART CATHETERIZATION WITH CORONARY ANGIOGRAM;  Surgeon: Iran Ouch, MD;  Location: [facility] CATH LAB;  Service:  Cardiovascular;  Laterality: N/A;  . MITRAL VALVE REPAIR  10/17/2011   Procedure: MITRAL VALVE REPAIR (MVR);  Surgeon: Purcell Nails, MD;  Location: [facility] OR;  Service: Open Heart Surgery;  Laterality: N/A;  . NASAL SINUS SURGERY  1990's   right    Current Outpatient Medications  Medication Sig Dispense Refill  . CALCIUM-MAGNESIUM-ZINC PO Take 2 tablets by mouth daily.    . carvedilol (COREG) 12.5 MG tablet Take one tablet (12.5 mg ) by mouth three times a day. 270 tablet 3  . ELIQUIS 5 MG TABS tablet Take 2.5 mg by mouth 2 (two) times daily.    . furosemide (LASIX) 20 MG tablet Take 1 tablet (20 mg total) by mouth daily. 90 tablet 3  . glipiZIDE (GLUCOTROL XL) 10 MG 24 hr tablet Take 10 mg by mouth 2 times daily at 12 noon and 4 pm. Pt takes it in the AM and PM    . hydrALAZINE (APRESOLINE) 25 MG tablet Take 25 mg by mouth 3 (three) times daily as needed (for bp levels).    . Insulin Glargine (TOUJEO SOLOSTAR) 300 UNIT/ML SOPN Inject 12-15 Units as directed daily.     [name] Kitchen losartan (COZAAR) 25 MG tablet Take 1 tablet (25 mg total) by mouth daily. 30 tablet 11  . Multiple Vitamin (THERA) TABS Take 1 tablet by mouth daily.    . Multiple Vitamins-Minerals (MULTIVITAMINS THER. W/MINERALS) TABS Take 1 tablet by mouth daily.    . pantoprazole (PROTONIX) 40 MG tablet Take 1 tablet by mouth daily.    . rosuvastatin (CRESTOR) 20 MG tablet Take 20 mg by mouth every evening.     . testosterone cypionate (DEPO-TESTOSTERONE) 200 MG/ML injection Inject 0.5 mg into the muscle every 21 ( twenty-one) days.      No current facility-administered medications for this visit.    Allergies:   Ace inhibitors and Penicillins   Social History:  The patient  reports that he has never smoked. He has never used smokeless tobacco. He reports that he does not drink alcohol and does not use drugs.   Family History:  The patient's family history includes Cardiomyopathy in his father; Diabetes in his father; Heart disease  in his mother.  ROS:  Please see the history of present illness. All other systems are reviewed and otherwise negative.   PHYSICAL EXAM:  VS:  There were no vitals taken for this visit. BMI: There is no height or weight on file to calculate BMI. Well nourished, well developed, in no acute distress  HEENT: normocephalic, atraumatic  Neck: no JVD, carotid bruits or masses Cardiac:  RRR; no significant murmurs, no rubs, or gallops Lungs:  CTA b/l, no wheezing, rhonchi or rales  Abd: soft, nontender MS: no deformity or atrophy Ext: he has 1+ ankle LE edema to just above the ankle Skin: warm and dry, no rash Neuro:  No gross deficits appreciated Psych: euthymic mood, full affect  ICD site  is stable, no tethering or discomfort   EKG:  Done today and reviewed by myself  AFlutter, V paced  ICD interrogation done today and reviewed by myself Battery and lead measurements are good No VT 99.3% mode switched Base pacing programming was changed from VDD 40 > VDIR 60 (his mode switched programming)   09/21/2017: TTE Study Conclusions  - Left ventricle: The cavity size was moderately dilated. Wall  thickness was normal. Systolic function was severely reduced. The  estimated ejection fraction was in the range of 20% to 25%.  Diffuse hypokinesis. There is akinesis of the inferolateral  myocardium. The study is not technically sufficient to allow  evaluation of LV diastolic function.  - Aortic valve: There was trivial regurgitation.  - Mitral valve: Prior procedures included surgical repair. There  was mild regurgitation. Valve area by continuity equation (using  LVOT flow): 1.62 cm^2.  - Tricuspid valve: There was mild-moderate regurgitation.  - Pulmonary arteries: Systolic pressure was moderately increased.  PA peak pressure: 53 mm Hg (S).   Impressions:  - Definity used; severe LV dysfunction; moderate LVE; trace AI; s/p  MV repair with mean gradient 3 mmHg and  mild MR; mild to moderate  TR with moderate pulmonary hypertension.     08/07/2015; stress myoview  Defect 1: There is a large defect of severe severity present in the basal inferior, basal inferolateral, basal anterolateral, mid inferior, mid inferolateral, mid anterolateral, apical inferior and apical lateral location.  Findings consistent with prior myocardial infarction.  This is a high risk study.  Nuclear stress EF: 29%. The aforementioned walls are severely hypokinetic to akinetic.   07/11/15: Echocardiogram Study Conclusions  - Left ventricle: Global Longitudinal LV strain is -10.5% The cavity size was mildly dilated. There was mild focal basal hypertrophy of the septum. Systolic function was moderately reduced. The estimated ejection fraction was in the range of 35% to 40%. Diffuse hypokinesis. There is akinesis of the basalinferior myocardium. There is akinesis of the entireinferolateral myocardium. There is akinesis of the basal-midlateral myocardium. - Aortic valve: Trileaflet; mildly thickened, mildly calcified leaflets. There was trivial regurgitation. - Aorta: Aortic root dimension: 37 mm (ED). - Aortic root: The aortic root was trivially dilated. - Mitral valve: S/P MV repari with mitral valve ring. There was mild regurgitation. Valve area by continuity equation (using LVOT flow): 1.6 cm^2. - Tricuspid valve: There was mild regurgitation. - Pulmonic valve: There was trivial regurgitation.    10/12/2011: R/LHC Hemodynamics RA 9 mmHg RV 46/8 mmHg PA 48/25 mmHg PCWP 22 mmHg. large V waves. LV 137/21 mmHg . LVEDP: 27 mmHg AO 137/82 mmHg  Oxygen saturations: PA 66 AO 94  Cardiac Output (Fick) 5.8  Cardiac Index (Fick) 2.48         Aortic Valve: Peak to Peak gradient: Not significant Pulmonary vascular resistance (PVR): 2 Woods units.    Coronary angiography: Coronary dominance: Codominant.   Left Main:  Normal in size  and mildly calcified. Minor irregularities without evidence of obstructive disease.  Left Anterior Descending (LAD):  Normal in size and moderately calcified. It wraps around the apex distally. There is a 40% proximal lesion close to the ostium. There is a 70-80% eccentric stenosis in the midsegment before the second diagonal. In the midsegment after the second diagonal there is another 60% stenosis. After that, there is mild diffuse disease.   1st diagonal (D1):  Very small in size.  2nd diagonal (D2):  Normal in size with minor irregularities.  3rd diagonal (  D3):  Small in size and free of significant disease.  Circumflex (LCx):  Normal in size and codominant. It has moderate calcifications. There is a 30% proximal stenosis at OM1. The rest of the vessel has minor irregularities.  1st obtuse marginal:  Appears to be normal in size and occluded proximally. It has collaterals from the left anterior descending artery.  2nd obtuse marginal:  Small in size.  3rd obtuse marginal:  Medium in size with mild 30% stenosis proximally.  The posterior AV groove artery is normal in size with minor irregularities. It gives to a medium size posterolateral branches.           Right Coronary Artery: The vessel is medium in size and heavily calcified. There is 40% proximal disease. The vessel is occluded in the midsegment after an RV branch with extensive bridging collaterals.  posterior descending artery:  Normal in size and fills via collaterals. It appears to be a good target for CABG. Left ventriculography: It was done with manual injection due to chronic kidney disease. Left ventricular systolic function is severely reduced , LVEF is estimated at 25% %, could not evaluate the severity of mitral regurgitation .   Final Conclusions:   1. Moderately elevated filling pressures with mild pulmonary hypertension. 2. Severely reduced LV systolic function with moderate to severe mitral regurgitation by  echocardiogram. 3. Severe three-vessel coronary artery disease.  Recommendations: I recommend coronary artery bypass graft surgery and mitral valve repair. He has good targets in LAD, OM1 and right PDA. I will resume his oral Lasix as his filling pressures are moderately elevated.   Recent Labs: No results found for requested labs within last 8760 hours.  No results found for requested labs within last 8760 hours.   CrCl cannot be calculated (Patient's most recent lab result is older than the maximum 21 days allowed.).   Wt Readings from Last 3 Encounters:  05/07/19 244 lb (110.7 kg)  02/08/18 238 lb (108 kg)  09/30/17 246 lb 14.6 oz (112 kg)     Other studies reviewed: Additional studies/records reviewed today include: summarized above   ASSESSMENT AND PLAN:  1.  ICM 2. Chronic CHF (systolic)     LVEF 20-25% by echo 2019      He has baseline waxing/waning edema.  He reports his endocrinologist 2-3 mo ago told him to take his lasix 20mg  BID and has been.       His lungs are clear he has no rest SOB, no nocturnal symptoms     He has some DOE that seems a baseline since his COVID in Dec, though he also says his back starts to hurt so bad he can hardly take it and thinks this is a big component of his SOB      Feb 2019 his Creat 1.93 Last month his Creat 1.89, his K+ 4.4 on a couple months of his BID lasix, will continue 20mg  BID Recommended support stockings and elevating his feet wen seated  3. CAD     + WMA on his echo (2019)     no anginal symptoms     On BB, statin  4. Longstanding persistent AF     CHADS2Vasc is at least 5, on Eliquis, appropriately dosed for age/Creat     Near 100% burden     RPH has already addressed Eliquis, cleared for hold of 3 days  Eliquis is a significant cost burden putting him in the dougnut hole early, he was given some samples,  we could consider Xarelto for him if less expensive, he will look into the cost burden for him and let us know  if it is better, could switch him.   5. VHD, s/p MVRepair       Functioning well on echo, 2019  6. Pre-op     Epidural steroid injection      Discussed with DOD, he has had change in exertional capacity though seems multifactorial and largely a component of his severe back pain, epidural is low risk and would clear  Discussed given his baseline cardiac status, he carries cardiac risk for any procedure, though epidural considered low risk for cardiac complications and not felt to have any cardiac contraindication to the procedure.  The pt states he understood to hold Eliquis for 5 days, we received a request to clear for 3 days and would be OK for 3 days.  He is instructed to clarify his instruction.      7. ICD     Intact function     Programming changes as above   Disposition: remotes Q 3 mo, in clinic in 64mo, sooner if needed.    Current medicines are reviewed at length with the patient today.  The patient did not have any concerns regarding medicines.  Judith Blonder, PA-C 02/07/2020 6:12 AM     [facility] [address] [place] [address] [phone] (office)  [phone] (fax)

## 2020-02-25 ENCOUNTER — Telehealth: Payer: Self-pay | Admitting: Internal Medicine

## 2020-02-25 MED ORDER — APIXABAN 5 MG PO TABS: ORAL_TABLET | ORAL | 1 refills | Status: DC

## 2020-02-25 NOTE — Telephone Encounter (Signed)
I spoke to the patient who called requesting Eliquis 5 mg tablets, which he would split in half to take 2.5 mg bid and prevent "going in the donut hole".    I told him that we could not do that and Renee had told him that at last OV.  I recommended that I would forward to [facility] for further assistance.

## 2020-02-25 NOTE — Telephone Encounter (Addendum)
**Note De-Identified Reise Hietala Obfuscation** The pt is insisting that Dr Ladona Ridgel has always written his Eliquis RX as 5mg  tablets with instructions to take 1/2 tablet BID but that we changed it at his last OV with , PA-c on 6/11 to 2.5 mg with instructions to take 1 tablet BID. I advised him that this is the correct way for 8/11 to fill his Eliquis because in most cases insurance will not cover a medication at a higher dose when the correct dose is available.  He is requesting that I send his Eliquis to [facility] pharmacy to fill as a 5 mg tablet with instructions to take 1/2 tablet BID #90 as he states it is less expensive this way. I advised him that I will do that but that his insurance may not cover it. He verbalized understanding.

## 2020-02-25 NOTE — Telephone Encounter (Signed)
Pt c/o medication issue:  1. Name of Medication: apixaban (ELIQUIS) 2.5 MG TABS tablet  2. How are you currently taking this medication (dosage and times per day)? As directed  3. Are you having a reaction (difficulty breathing--STAT)? no  4. What is your medication issue? Prescription for this medication was written for 2.5mg  but he wants to know if he can have it written for 5mg . He states he wants to "stay out of the donut hole." Please advise.

## 2020-02-26 ENCOUNTER — Encounter: Payer: Self-pay | Admitting: Gastroenterology

## 2020-02-26 NOTE — Progress Notes (Signed)
Referring Provider: Rebekah Chesterfield, NP Primary Care Physician:  Rebekah Chesterfield, NP Primary GI Physician: Dr. Jena Gauss  Chief Complaint  Patient presents with  . Abdominal Pain    lower abd  . Bloated  . Nausea    occ when bloating; vomited approx 3 weeks ago    HPI:   Wayne Gordon is a 86 y.o. male presenting today for abdominal pain, bloating, nausea.  Patient has multiple comorbidities as per below under PMHx.  GI history significant for GERD, epigastric pain, nausea without vomiting previously felt to be related to his gallbladder. Hospitalized in January/February 2019 with ascending cholangitis/choledocholithiasis s/p subtotal cholecystectomy (unable to complete total due to adhesions) with drain placement. He has recurrent cholangitis with choledocholithiasis and undergoing ERCP with stone removal by biliary sphincterotomy and balloon extraction at [facility] in February 2019.  Also with history of diverticular bleed in the setting of Eliquis in 2017.   Patient last seen by our staff during hospitalization in 2019 for cholangitis. Last OV in 2018.   Reviewed recent labs in Care Everywhere.  CMP 01/22/2020 remarkable for creatinine 1.89, glucose 159.  CBC within normal limits February 2021  Today: BP is low today. Taking carvedilol x 3 daily and lasix BID. Feels lightheaded but has been present for months. Stopped Losartan on his on about 6 months ago. Hydralazine as needed. Hasn't been checking BP at home. Doesn't feel like he will pass out. No vision changes. No headache.  Urine is clear. Drinking plenty of fluids. Had nursing staff recheck BP prior to patient leaving the office and BP had improved. I instructed patient to call PCP/cardiologist to discuss low BP today.   Lower abdominal pain for about 4 years. Dull. Feels it is somewhat worse with a bloated feeling. Pain is constant. About 6/10.  Some improvement if having a BM. Worse with eating. States he feels full all  the time in the lower abdomen.  Feels constipated and if he were to get cleaned out, this would help his lower abdominal pain. Drinking prune juice every day. Will have a small incomplete BM that is loose. Not watery. No weight loss.  Noted a small amount of blood in the stool a couple months ago x 1. Nothing since. No black stool.   Feels bloated in the upper abdomen 2-3 times a week with associated nausea and rare vomiting.  Bloating started about 2-4 weeks ago.  Typically last about 15 minutes. Yesterday lasted 2-3 hours. Burping helps.  Papaya tablets help. Often after eating, sometimes before.  Last episode of emesis was 3 weeks ago. No hematemesis. GERD symptoms several days a week. Taking omeprazole 20 mg daily not BID (which is how it is prescribed). No upper abdominal pain. No NSAIDs.   No fever other than when he had Covid vaccine. No CP or palpitations. No shortness of breath at rest. Some SOB with exertion. No cough. No urinary symptoms.   Past Medical History:  Diagnosis Date  . Allergy to ACE inhibitors    Angioedema many years ago; patient has tolerated Losartan (ARB) in the past - it was held during [facility] for GI bleed and worsening renal function >> resume Losartan 25 mg QD 06/2015  . Arthritis   . Atrial fibrillation (HCC)    PAF, CHADs2Vasc = 5  . Carotid artery disease (HCC)    60-79% LICA  . CHF (congestive heart failure) (HCC)   . CKD (chronic kidney disease)   . Coronary  artery disease   . Diabetes mellitus   . GERD (gastroesophageal reflux disease)   . Hyperlipidemia   . Hypertension   . ICD (implantable cardiac defibrillator) in place 03/28/2012   [place], Dr. Ladona Ridgel 03/28/12  . Ischemic cardiomyopathy    S/P CABG x 3; EF 25%  . Mitral regurgitation    S/P mitral valve repair 2013  . Myocardial infarction (HCC)   . Renal insufficiency     Past Surgical History:  Procedure Laterality Date  . CHOLECYSTECTOMY N/A 09/20/2017   Procedure: LAPAROSCOPIC SUBTOTAL  CHOLECYSTECTOMY;  Surgeon: Axel Filler, MD;  Location: [facility] OR;  Service: General;  Laterality: N/A;  . COLONOSCOPY N/A 04/24/2014   RMR Pancolonic diverticulosis  . CORONARY ARTERY BYPASS GRAFT  10/17/2011   Procedure: CORONARY ARTERY BYPASS GRAFTING (CABG);  Surgeon: Purcell Nails, MD;  Location: [facility] OR;  Service: Open Heart Surgery;  Laterality: N/A;  . ERCP N/A 09/29/2017   Procedure: ENDOSCOPIC RETROGRADE CHOLANGIOPANCREATOGRAPHY (ERCP);  Surgeon: Jeani Hawking, MD;  Location: [facility] ENDOSCOPY;  Service: Endoscopy;  Laterality: N/A;  . ESOPHAGOGASTRODUODENOSCOPY N/A 04/24/2014   RMR Subtle nodularity the gastric mucosa of uncertain significance-status post biopsy. Hiatal hernia. chronic inflammation, no H.pylori  . ESOPHAGOGASTRODUODENOSCOPY N/A 06/24/2016   Dr. Jena Gauss: normal esophagus, small hiatal hernia, normal duodenum  . ICD  03/28/2012  . IMPLANTABLE CARDIOVERTER DEFIBRILLATOR IMPLANT N/A 03/28/2012   Procedure: IMPLANTABLE CARDIOVERTER DEFIBRILLATOR IMPLANT;  Surgeon: Marinus Maw, MD;  Location: [facility] CATH LAB;  Service: Cardiovascular;  Laterality: N/A;  . KNEE ARTHROSCOPY  ~ 2008   right  . LEFT AND RIGHT HEART CATHETERIZATION WITH CORONARY ANGIOGRAM N/A 10/12/2011   Procedure: LEFT AND RIGHT HEART CATHETERIZATION WITH CORONARY ANGIOGRAM;  Surgeon: Iran Ouch, MD;  Location: [facility] CATH LAB;  Service: Cardiovascular;  Laterality: N/A;  . MITRAL VALVE REPAIR  10/17/2011   Procedure: MITRAL VALVE REPAIR (MVR);  Surgeon: Purcell Nails, MD;  Location: [facility] OR;  Service: Open Heart Surgery;  Laterality: N/A;  . NASAL SINUS SURGERY  1990's   right    Current Outpatient Medications  Medication Sig Dispense Refill  . CALCIUM-MAGNESIUM-ZINC PO Take 2 tablets by mouth daily.    . carvedilol (COREG) 12.5 MG tablet Take one tablet (12.5 mg ) by mouth three times a day. 270 tablet 3  . Cyanocobalamin (VITAMIN B-12 IJ) Inject 1 mL as directed every 30 (thirty) days.     . furosemide (LASIX) 20  MG tablet Take 1 tablet (20 mg total) by mouth 2 (two) times daily. 180 tablet 3  . gabapentin (NEURONTIN) 100 MG capsule Take 100 mg by mouth 2 (two) times daily.    [name] Kitchen glipiZIDE (GLUCOTROL XL) 10 MG 24 hr tablet Take 10 mg by mouth 2 times daily at 12 noon and 4 pm. Pt takes it in the AM and PM    . hydrALAZINE (APRESOLINE) 25 MG tablet Take 25 mg by mouth 3 (three) times daily as needed (for bp levels).    . Insulin Glargine (TOUJEO SOLOSTAR) 300 UNIT/ML SOPN Inject 12-15 Units as directed daily.     . Multiple Vitamins-Minerals (MULTIVITAMINS THER. W/MINERALS) TABS Take 1 tablet by mouth daily.    . rosuvastatin (CRESTOR) 20 MG tablet Take 20 mg by mouth every evening.     . testosterone cypionate (DEPO-TESTOSTERONE) 200 MG/ML injection Inject 0.5 mg into the muscle every 21 ( twenty-one) days.     [name] Kitchen apixaban (ELIQUIS) 2.5 MG TABS tablet Take 1 tablet (2.5 mg total) by  mouth 2 (two) times daily. 180 tablet 1  . losartan (COZAAR) 25 MG tablet Take 1 tablet (25 mg total) by mouth daily. (Patient not taking: Reported on 02/27/2020) 30 tablet 11  . omeprazole (PRILOSEC) 40 MG capsule Take 1 capsule (40 mg total) by mouth daily before breakfast. 90 capsule 1   No current facility-administered medications for this visit.    Allergies as of 02/27/2020 - Review Complete 02/27/2020  Allergen Reaction Noted  . Ace inhibitors Swelling and Other (See Comments) 02/03/2014  . Penicillins Palpitations 10/10/2011    Family History  Problem Relation Age of Onset  . Heart disease Mother   . Diabetes Father   . Cardiomyopathy Father   . Colon cancer Neg Hx     Social History   Socioeconomic History  . Marital status: Married    Spouse name: Not on file  . Number of children: Not on file  . Years of education: Not on file  . Highest education level: Not on file  Occupational History  . Occupation: Retired  Tobacco Use  . Smoking status: Never Smoker  . Smokeless tobacco: Never Used  Vaping Use   . Vaping Use: Never used  Substance and Sexual Activity  . Alcohol use: No    Alcohol/week: 0.0 standard drinks  . Drug use: No  . Sexual activity: Yes  Other Topics Concern  . Not on file  Social History Narrative  . Not on file   Social Determinants of Health   Financial Resource Strain:   . Difficulty of Paying Living Expenses:   Food Insecurity:   . Worried About Programme researcher, broadcasting/film/video in the Last Year:   . Barista in the Last Year:   Transportation Needs:   . Freight forwarder (Medical):   [name] Kitchen Lack of Transportation (Non-Medical):   Physical Activity:   . Days of Exercise per Week:   . Minutes of Exercise per Session:   Stress:   . Feeling of Stress :   Social Connections:   . Frequency of Communication with Friends and Family:   . Frequency of Social Gatherings with Friends and Family:   . Attends Religious Services:   . Active Member of Clubs or Organizations:   . Attends Banker Meetings:   [name] Kitchen Marital Status:     Review of Systems: Gen: See HPI CV: See HPI Resp: See HPI GI: See HPI Heme: See HPI  Physical Exam: BP 114/70 Comment: rechecked manually  Pulse 60   Temp (!) 97.5 F (36.4 C) (Oral)   Ht 6\' 4"  (1.93 m)   Wt 248 lb 3.2 oz (112.6 kg)   BMI 30.21 kg/m  General:   Alert and oriented. No distress noted. Pleasant and cooperative.  Head:  Normocephalic and atraumatic. Eyes:  Conjuctiva clear without scleral icterus. Heart:  S1, S2 present without murmurs appreciated. Lungs:  Clear to auscultation bilaterally. No wheezes, rales, or rhonchi. No distress.  Abdomen:  +BS, soft, non-tender and non-distended. No rebound or guarding. No HSM or masses noted. Msk:  Symmetrical without gross deformities. Normal posture. Extremities:  With 1+ bilateral LE edema in ankles.  Neurologic:  Alert and  oriented x4 Psych: Normal mood and affect.

## 2020-02-27 ENCOUNTER — Other Ambulatory Visit: Payer: Self-pay

## 2020-02-27 ENCOUNTER — Ambulatory Visit (INDEPENDENT_AMBULATORY_CARE_PROVIDER_SITE_OTHER): Payer: Medicare HMO | Admitting: *Deleted

## 2020-02-27 ENCOUNTER — Encounter: Payer: Self-pay | Admitting: Gastroenterology

## 2020-02-27 ENCOUNTER — Ambulatory Visit: Payer: Medicare HMO | Admitting: Gastroenterology

## 2020-02-27 VITALS — BP 114/70 | HR 60 | Temp 97.5°F | Ht 76.0 in | Wt 248.2 lb

## 2020-02-27 DIAGNOSIS — K219 Gastro-esophageal reflux disease without esophagitis: Secondary | ICD-10-CM

## 2020-02-27 DIAGNOSIS — R140 Abdominal distension (gaseous): Secondary | ICD-10-CM | POA: Diagnosis not present

## 2020-02-27 DIAGNOSIS — R031 Nonspecific low blood-pressure reading: Secondary | ICD-10-CM

## 2020-02-27 DIAGNOSIS — R112 Nausea with vomiting, unspecified: Secondary | ICD-10-CM | POA: Insufficient documentation

## 2020-02-27 DIAGNOSIS — R1030 Lower abdominal pain, unspecified: Secondary | ICD-10-CM | POA: Diagnosis not present

## 2020-02-27 DIAGNOSIS — R109 Unspecified abdominal pain: Secondary | ICD-10-CM | POA: Insufficient documentation

## 2020-02-27 DIAGNOSIS — I442 Atrioventricular block, complete: Secondary | ICD-10-CM

## 2020-02-27 LAB — CUP PACEART REMOTE DEVICE CHECK
Brady Statistic RV Percent Paced: 99 %
Date Time Interrogation Session: 20210701060215
HighPow Impedance: 77 Ohm
Implantable Lead Implant Date: 20130731
Implantable Lead Location: 753860
Implantable Lead Model: 365
Implantable Lead Serial Number: 10505793
Implantable Pulse Generator Implant Date: 20130731
Lead Channel Impedance Value: 491 Ohm
Lead Channel Sensing Intrinsic Amplitude: 1.8 mV
Lead Channel Setting Pacing Amplitude: 2.5 V
Lead Channel Setting Pacing Pulse Width: 0.4 ms
Lead Channel Setting Sensing Sensitivity: 0.8 mV
Pulse Gen Serial Number: 60687186

## 2020-02-27 MED ORDER — OMEPRAZOLE 40 MG PO CPDR: 40.0000 mg | DELAYED_RELEASE_CAPSULE | Freq: Every day | ORAL | 1 refills | Status: DC

## 2020-02-27 NOTE — Patient Instructions (Addendum)
Please call your PCP and/or cardiologist today to discuss your low blood pressure. Your blood pressure was 94/59 today.  You did not have any symptoms from this; however, should you get lightheaded, dizzy, feel like you will pass out, you should proceed to the emergency room.  Please keep a check on her blood pressure at home and if it gets any lower, you should also proceed to the emergency room.  Please stop omeprazole 20 mg and start omeprazole 40 mg daily 30 minutes before breakfast.  I am sending a new prescription of omeprazole 40 mg to your pharmacy.  Follow a GERD diet:  Avoid fried, fatty, greasy, spicy, citrus foods. Avoid caffeine and carbonated beverages. Avoid chocolate. Try eating 4-6 small meals a day rather than 3 large meals. Do not eat within 3 hours of laying down. Prop head of bed up on wood or bricks to create a 6 inch incline.  Limit gas producing foods including broccoli, cauliflower, cabbage, Brussels sprouts, beans, carbonated beverages, artificial sweeteners, and drinking through a straw.  I am providing you with samples of Linzess 145 mcg to help get your bowels moving well.  Take 1 capsule 30 minutes before breakfast daily x3-4 days.  Once your bowels are moving well, start taking MiraLAX 1 capful (17 g) daily in 8 ounces of water.  Add Benefiber or Metamucil daily.  Please call me in 1 week with a progress report on how you are doing.  If your symptoms are not improving, we may need to evaluate further with imaging.   Otherwise, we will plan to see you back in 3 months.  Please call with questions or concerns prior.  Ermalinda Memos, PA-C [facility]

## 2020-02-28 ENCOUNTER — Other Ambulatory Visit: Payer: Self-pay | Admitting: Internal Medicine

## 2020-02-28 MED ORDER — APIXABAN 2.5 MG PO TABS: 2.5000 mg | ORAL_TABLET | Freq: Two times a day (BID) | ORAL | 1 refills | Status: DC

## 2020-02-28 NOTE — Telephone Encounter (Signed)
*  STAT* If patient is at the pharmacy, call can be transferred to refill team.   1. Which medications need to be refilled? (please list name of each medication and dose if known)  apixaban (ELIQUIS) 5 MG TABS tablet  2. Which pharmacy/location (including street and city if local pharmacy) is medication to be sent to?  [facility] - [place], [place] - [address]  3. Do they need a 30 day or 90 day supply? 90   Records show that the rx was sent to [facility], [place] - [address] (Ph: [phone]) which is not the correct pharmacy. Please re-submit rx

## 2020-02-28 NOTE — Progress Notes (Signed)
Remote ICD transmission.   

## 2020-02-28 NOTE — Telephone Encounter (Signed)
I will send an rx for Eliquis, but it will be fore 2.5mg  BID. It is not cheaper to fill 5mg  1/2 tab BID (#90) than to fill 2.5mg  BID # 180. It is insurance fraud to write rx for 5mg  BID and have him take 1/2. I spoke with the pharmacist at [facility] and asked her to explain to the patient why he should not be splitting them (safety) and that it is not saving him $ if we write the Rx for 5mg  take 1/2 tab BID # 90. We have tried to explain to patient multiple times, therefore I asked him pharmacist to explain as well.

## 2020-02-29 ENCOUNTER — Encounter: Payer: Self-pay | Admitting: Gastroenterology

## 2020-02-29 NOTE — Assessment & Plan Note (Addendum)
Patient's blood pressure was initially 94/59 today.  He is asymptomatic.  Repeat blood pressure prior to patient leaving the office with BP 114/70.  Currently taking carvedilol 3 times daily, Lasix twice daily, and hydralazine as needed.  Patient reports stopping losartan on his own about 6 months ago.  Patient was advised to call his PCP or cardiologist today to discuss low blood pressure.  He was also advised to monitor his blood pressure at home.  Advised he proceed to the emergency room if his blood pressure were to get any lower.  Additionally advised he proceed to the emergency room if he became lightheaded, dizzy, or felt like he would pass out.

## 2020-02-29 NOTE — Assessment & Plan Note (Addendum)
Patient reporting a constant 4-year history of lower abdominal pain that is dull and somewhat worse with a bloated sensation. Worse with eating. Improves somewhat after BMs, but patient notes he is not having productive bowel movements.  Drinking prune juice every day with small, incomplete, loose BMs once daily. 1 episode of blood in the stool couple of months ago.  No melena.  Weight is stable.  Last colonoscopy in 2015 with pancolonic diverticulosis.  History of diverticular bleed in 2017.  Abdominal exam today is benign.  Suspect constipation is contributing to lower abdominal discomfort/bloated sensation.  As patient desires to get "cleaned out", we will provide him with samples of Linzess 145 mcg which she will take x4 days and then start MiraLAX 1 capful (17 g) daily in 8 ounces of water.  I have also instructed him to add Benefiber or Metamucil daily.  He will call in 1 week with a progress report.  If symptoms do not improve with improvement in constipation, would consider imaging.  Plan to follow-up in 3 months.

## 2020-02-29 NOTE — Assessment & Plan Note (Signed)
Addressed under GERD 

## 2020-02-29 NOTE — Assessment & Plan Note (Addendum)
GERD symptoms not adequately controlled currently taking omeprazole 20 mg daily rather than twice daily which is how it is prescribed.  Associated bloating/nausea with rare vomiting x2-3 weeks.  Denies upper abdominal pain or NSAIDs.  Abdominal exam is benign.  Overall, suspect upper GI symptoms are likely secondary to uncontrolled GERD.  Notably, patient does have history of cholelithiasis with choledocholithiasis/cholangitis in 2019 s/p subtotal cholecystectomy due to adhesions.  Recurrent choledocholithiasis with cholangitis s/p ERCP with biliary sphincterotomy in 2019.  Most recent labs 01/22/2020 in care everywhere with LFTs within normal limits.  Doubt biliary etiology of intermittent bloating/nausea with rare vomiting but cannot rule it out.  Stop omeprazole 20 mg and start omeprazole 40 mg daily. Follow GERD diet.  Counseled on this.  See separate instructions. Avoid gas producing items.  Counseled on this.  See separate instructions. Consider reevaluating biliary etiology if symptoms do not improve with improvement of GERD symptoms.   Follow-up in 3 months. Requested progress report in 1 week.

## 2020-02-29 NOTE — Assessment & Plan Note (Addendum)
Associated with uncontrolled GERD as discussed above and constipation as discussed below under abdominal pain.

## 2020-03-03 ENCOUNTER — Telehealth: Payer: Self-pay | Admitting: Internal Medicine

## 2020-03-03 NOTE — Telephone Encounter (Signed)
Pt c/o medication issue:  1. Name of Medication:   apixaban (ELIQUIS) 2.5 MG TABS tablet   2. How are you currently taking this medication (dosage and times per day)? As directed  3. Are you having a reaction (difficulty breathing--STAT)? no  4. What is your medication issue? Refill is being sent for 2.5mg  but he states that Dr. Ladona Ridgel normally gives him 5mg  and he cuts it in half himself. He wants to know if he can get a new Rx sent to [facility] pharmacy for 5mg . Please advise.

## 2020-03-03 NOTE — Telephone Encounter (Signed)
Returned call to Pt.  Read note to Pt from pharmacist dictated on 02/28/20  Pt states he has done this with his Eliquis for years.  Advised that it was missed somehow by our office and we will no longer be writing prescription for Eliquis 5 mg 1/2 tab bid.  Pt states he can't afford $500 a month for Eliquis.  Asked if Pt had applied for assistance.  He states he has before.  Advised he needed to apply every year.  Will forward to [place] to assist Pt with Eliquis application

## 2020-03-04 ENCOUNTER — Telehealth: Payer: Self-pay | Admitting: *Deleted

## 2020-03-04 NOTE — Telephone Encounter (Signed)
Called the patient and informed him to hold his Eliquis for 3 days prior to procedure and the last time to take the Eliquis will be 07/08 Thursday evening and to restart as soon as possible at the discretion of the surgeon. Patient verbalized an understanding and all (if any) questions were answered.

## 2020-03-04 NOTE — Telephone Encounter (Signed)
Patient was already cleared to proceed with procedure after holding Eliquis for 3 days. Please forward the recent EP note by Mack Guise on 6/11 to the requesting provider's office. The holding time for Eliquis should be 3 days prior to the procedure and restart as soon as possible after the procedure at the surgeon's discretion.

## 2020-03-04 NOTE — Telephone Encounter (Signed)
Follow up   Per April she states that this is not a duplicate clearance. She states that this is a new clearance. Please call if you have any questions.

## 2020-03-04 NOTE — Telephone Encounter (Signed)
I spoke with April from Dr. Jon Gills office, this request is a duplicate.   Callback pool, please: 1. Forward a copy of recent office note by Wayne Gordon from 6/11 to the requesting provider  2. Inform the patient to hold Eliquis for 3 days prior to the surgery, since surgery is scheduled for 7/12, the last dose of Eliquis should be the night of 7/8 (tomorrow night). We recommend restarting the Eliquis as soon as possible after the procedure at the surgeon's discretion.

## 2020-03-04 NOTE — Telephone Encounter (Signed)
° °  [facility] Pre-operative Risk Assessment    HEARTCARE STAFF: - Please ensure there is not already an duplicate clearance open for this procedure. - Under Visit Info/Reason for Call, type in Other and utilize the format Clearance MM/DD/YY or Clearance TBD. Do not use dashes or single digits. - If request is for dental extraction, please clarify the # of teeth to be extracted.  Request for surgical clearance: NOT SURE IF THIS IS A DUPLICATE FROM 03/02/87. I CALLED THE REQUESTING OFFICE AND THEY WILL CALL BACK TO VERIFY. SEE RENEE URSUY's 02/07/20 OV NOTE IN REGARDS TO CLEARANCE   1. What type of surgery is being performed? CAUDAL EPIDURAL STEROID INJECTION   2. When is this surgery scheduled? 03/09/20   3. What type of clearance is required (medical clearance vs. Pharmacy clearance to hold med vs. Both)? BOTH  4. Are there any medications that need to be held prior to surgery and how long? ELIQUIS X 3 DAYS   5. Practice name and name of physician performing surgery? [place]; DR. DAVID O'TOOLE   6. What is the office phone number? [phone]   7.   What is the office fax number? [phone]  8.   Anesthesia type (None, local, MAC, general) ? NONE LISTED   Julaine Hua 03/04/2020, 10:09 AM  _________________________________________________________________   (provider comments below)

## 2020-03-04 NOTE — Telephone Encounter (Signed)
**Note De-Identified Melora Menon Obfuscation** The pt states that he understands that we cannot legally write his Eliquis RX for 5 mg take 1/2 tab BID. I discussed him applying for pt asst through [facility] [employer] and he is interested. I gave him their phone number [phone] and advised him to call them with eligibility questions and to request that the mail him an application.  He is aware to complete his part of the application, obtain needed documents per [employer] and to bring all to the office to drop off and that we will take care of the provider part of the application and will fax all in to [employer] Pt Asst program. He thanked me for calling him with this information.

## 2020-03-11 ENCOUNTER — Telehealth: Payer: Self-pay

## 2020-03-11 NOTE — Telephone Encounter (Signed)
Paperwork faxed as requested

## 2020-03-11 NOTE — Telephone Encounter (Signed)
**Note De-Identified Wayne Gordon Obfuscation** The pt left his completed [employer] Pt Asst application along with documents at the office this morning. While here he requested Eliquis samples but left before refill dept could get them for him. I called the pt and he stated that he is almost out of Eliquis and only has a couple days worth left. I advised him that we will leave him a couple sample bottles of Eliquis 2.5 mg in the front office for him to pick up. He stated that he is too far to come back to the office but he will have his daughter pick them up for him.  I have completed the provider part of the pts [employer] Pt Asst application and emailed all to Dr Bruna Potter nurse so she can obtain his signature, date it, and to fax all to [employer] at fax number written on cover letter included.

## 2020-03-18 ENCOUNTER — Telehealth: Payer: Self-pay | Admitting: Internal Medicine

## 2020-03-18 NOTE — Telephone Encounter (Signed)
Spoke with patient who reports he has been having increase in edema bilaterally in feet/ankles every since he got his 2nd Covid vaccine.  He has also had a steroid injection in his back recently as well.  He saw his PCP today who increased his Furosemide.  He will follow those directions.  Advised to monitor/limit fluid and NA+ intake. Advised to keep feet/leg elevated above the level of his heart as much as possible.  Pt is unable to wear compression stockings at this time due to weeping.  Pt has been using "New Skin" which has helped.  Encouraged pt to try to make sure to wash his hands before touching his leg wounds and to keep open areas clean and dry as possible to prevent infection.  He states understanding.  He will c/b before his upcoming scheduled appt if he has further concerns.

## 2020-03-18 NOTE — Telephone Encounter (Signed)
Pt c/o swelling: STAT is pt has developed SOB within 24 hours  1) How much weight have you gained and in what time span? Pt says he has gained 1 lb/day since his 2nd COVID shot 06/21.  2) If swelling, where is the swelling located? Feet, ankles, legs  3) Are you currently taking a fluid pill? Yes,furosemide (LASIX) 20 MG tablet.   4) Are you currently SOB? After walking   5) Do you have a log of your daily weights (if so, list)?  03/18/20:  251  6) Have you gained 3 pounds in a day or 5 pounds in a week? His weight goes up and down  7) Have you traveled recently? No  The patient saw his PCP today and his PCP advised him to take 80 mg instead of the 40 mg. Pt is scheduled to see Dr. Ladona Ridgel next Tuesday 07/27.

## 2020-03-22 NOTE — Telephone Encounter (Signed)
I agree with rec's as noted. Keep legs elevated.

## 2020-03-24 ENCOUNTER — Ambulatory Visit: Payer: Medicare HMO | Admitting: Internal Medicine

## 2020-03-24 ENCOUNTER — Other Ambulatory Visit: Payer: Self-pay

## 2020-03-24 ENCOUNTER — Encounter: Payer: Self-pay | Admitting: Internal Medicine

## 2020-03-24 VITALS — BP 162/80 | HR 60 | Ht 76.0 in | Wt 252.0 lb

## 2020-03-24 DIAGNOSIS — I480 Paroxysmal atrial fibrillation: Secondary | ICD-10-CM

## 2020-03-24 DIAGNOSIS — I442 Atrioventricular block, complete: Secondary | ICD-10-CM

## 2020-03-24 DIAGNOSIS — Z95810 Presence of automatic (implantable) cardiac defibrillator: Secondary | ICD-10-CM | POA: Diagnosis not present

## 2020-03-24 MED ORDER — SULFAMETHOXAZOLE-TRIMETHOPRIM 800-160 MG PO TABS: 1.0000 | ORAL_TABLET | Freq: Two times a day (BID) | ORAL | 0 refills | Status: AC

## 2020-03-24 NOTE — Progress Notes (Signed)
HPI Wayne Gordon returns today for followup. He is a pleasant 86 yo man with persistent atrial fib, HTN, CAD, s/p CABG, ICM with chronic systolic heart failure, and now CHB. He is s/p ICD insertion and is pacing in the ventricle almost 100% of the time. He has done well until he developed covid 19 infection. He has very slowly improved. He has not had chest pain. He has had some edema and has a sore on his right inner leg. He denies trauma.    Current Outpatient Medications  Medication Sig Dispense Refill  . apixaban (ELIQUIS) 2.5 MG TABS tablet Take 1 tablet (2.5 mg total) by mouth 2 (two) times daily. 180 tablet 1  . CALCIUM-MAGNESIUM-ZINC PO Take 2 tablets by mouth daily.    . carvedilol (COREG) 12.5 MG tablet Take one tablet (12.5 mg ) by mouth three times a day. 270 tablet 3  . Cyanocobalamin (VITAMIN B-12 IJ) Inject 1 mL as directed every 30 (thirty) days.     . furosemide (LASIX) 40 MG tablet Take 1 tablet by mouth daily.    [name] Kitchen gabapentin (NEURONTIN) 100 MG capsule Take 100 mg by mouth 2 (two) times daily.    [name] Kitchen glipiZIDE (GLUCOTROL XL) 10 MG 24 hr tablet Take 10 mg by mouth 2 times daily at 12 noon and 4 pm. Pt takes it in the AM and PM    . hydrALAZINE (APRESOLINE) 25 MG tablet Take 25 mg by mouth 3 (three) times daily as needed (for bp levels).    . Insulin Glargine (TOUJEO SOLOSTAR) 300 UNIT/ML SOPN Inject 12-15 Units as directed daily.     [name] Kitchen losartan (COZAAR) 25 MG tablet Take 1 tablet (25 mg total) by mouth daily. 30 tablet 11  . Multiple Vitamins-Minerals (MULTIVITAMINS THER. W/MINERALS) TABS Take 1 tablet by mouth daily.    [name] Kitchen omeprazole (PRILOSEC) 40 MG capsule Take 1 capsule (40 mg total) by mouth daily before breakfast. 90 capsule 1  . rosuvastatin (CRESTOR) 20 MG tablet Take 20 mg by mouth every evening.     . testosterone cypionate (DEPO-TESTOSTERONE) 200 MG/ML injection Inject 0.5 mg into the muscle every 21 ( twenty-one) days.      No current facility-administered  medications for this visit.     Past Medical History:  Diagnosis Date  . Allergy to ACE inhibitors    Angioedema many years ago; patient has tolerated Losartan (ARB) in the past - it was held during [facility] for GI bleed and worsening renal function >> resume Losartan 25 mg QD 06/2015  . Arthritis   . Atrial fibrillation (HCC)    PAF, CHADs2Vasc = 5  . Carotid artery disease (HCC)    60-79% LICA  . CHF (congestive heart failure) (HCC)   . CKD (chronic kidney disease)   . Coronary artery disease   . Diabetes mellitus   . GERD (gastroesophageal reflux disease)   . Hyperlipidemia   . Hypertension   . ICD (implantable cardiac defibrillator) in place 03/28/2012   [place]ron[place], Dr. Ladona Ridgel 03/28/12  . Ischemic cardiomyopathy    S/P CABG x 3; EF 25%  . Mitral regurgitation    S/P mitral valve repair 2013  . Myocardial infarction (HCC)   . Renal insufficiency     ROS:   All systems reviewed and negative except as noted in the HPI.   Past Surgical History:  Procedure Laterality Date  . CHOLECYSTECTOMY N/A 09/20/2017   Procedure: LAPAROSCOPIC SUBTOTAL CHOLECYSTECTOMY;  Surgeon: Axel Filler, MD;  Location: [facility] OR;  Service: General;  Laterality: N/A;  . COLONOSCOPY N/A 04/24/2014   RMR Pancolonic diverticulosis  . CORONARY ARTERY BYPASS GRAFT  10/17/2011   Procedure: CORONARY ARTERY BYPASS GRAFTING (CABG);  Surgeon: Purcell Nails, MD;  Location: [facility] OR;  Service: Open Heart Surgery;  Laterality: N/A;  . ERCP N/A 09/29/2017   Procedure: ENDOSCOPIC RETROGRADE CHOLANGIOPANCREATOGRAPHY (ERCP);  Surgeon: Jeani Hawking, MD;  Location: [facility] ENDOSCOPY;  Service: Endoscopy;  Laterality: N/A;  . ESOPHAGOGASTRODUODENOSCOPY N/A 04/24/2014   RMR Subtle nodularity the gastric mucosa of uncertain significance-status post biopsy. Hiatal hernia. chronic inflammation, no H.pylori  . ESOPHAGOGASTRODUODENOSCOPY N/A 06/24/2016   Dr. Jena Gauss: normal esophagus, small hiatal hernia, normal duodenum  . ICD   03/28/2012  . IMPLANTABLE CARDIOVERTER DEFIBRILLATOR IMPLANT N/A 03/28/2012   Procedure: IMPLANTABLE CARDIOVERTER DEFIBRILLATOR IMPLANT;  Surgeon: Marinus Maw, MD;  Location: [facility] CATH LAB;  Service: Cardiovascular;  Laterality: N/A;  . KNEE ARTHROSCOPY  ~ 2008   right  . LEFT AND RIGHT HEART CATHETERIZATION WITH CORONARY ANGIOGRAM N/A 10/12/2011   Procedure: LEFT AND RIGHT HEART CATHETERIZATION WITH CORONARY ANGIOGRAM;  Surgeon: Iran Ouch, MD;  Location: [facility] CATH LAB;  Service: Cardiovascular;  Laterality: N/A;  . MITRAL VALVE REPAIR  10/17/2011   Procedure: MITRAL VALVE REPAIR (MVR);  Surgeon: Purcell Nails, MD;  Location: [facility] OR;  Service: Open Heart Surgery;  Laterality: N/A;  . NASAL SINUS SURGERY  1990's   right     Family History  Problem Relation Age of Onset  . Heart disease Mother   . Diabetes Father   . Cardiomyopathy Father   . Colon cancer Neg Hx      Social History   Socioeconomic History  . Marital status: Married    Spouse name: Not on file  . Number of children: Not on file  . Years of education: Not on file  . Highest education level: Not on file  Occupational History  . Occupation: Retired  Tobacco Use  . Smoking status: Never Smoker  . Smokeless tobacco: Never Used  Vaping Use  . Vaping Use: Never used  Substance and Sexual Activity  . Alcohol use: No    Alcohol/week: 0.0 standard drinks  . Drug use: No  . Sexual activity: Yes  Other Topics Concern  . Not on file  Social History Narrative  . Not on file   Social Determinants of Health   Financial Resource Strain:   . Difficulty of Paying Living Expenses:   Food Insecurity:   . Worried About Programme researcher, broadcasting/film/video in the Last Year:   . Barista in the Last Year:   Transportation Needs:   . Freight forwarder (Medical):   [name] Kitchen Lack of Transportation (Non-Medical):   Physical Activity:   . Days of Exercise per Week:   . Minutes of Exercise per Session:   Stress:   . Feeling of  Stress :   Social Connections:   . Frequency of Communication with Friends and Family:   . Frequency of Social Gatherings with Friends and Family:   . Attends Religious Services:   . Active Member of Clubs or Organizations:   . Attends Banker Meetings:   [name] Kitchen Marital Status:   Intimate Partner Violence:   . Fear of Current or Ex-Partner:   . Emotionally Abused:   [name] Kitchen Physically Abused:   . Sexually Abused:      BP (!) 162/80   Pulse 60   Ht  6\' 4"  (1.93 m)   Wt (!) 252 lb (114.3 kg)   SpO2 96%   BMI 30.67 kg/m   Physical Exam:  Well appearing NAD HEENT: Unremarkable Neck:  No JVD, no thyromegally Lymphatics:  No adenopathy Back:  No CVA tenderness Lungs:  Clear with no wheezes HEART:  Regular rate rhythm, no murmurs, no rubs, no clicks Abd:  soft, positive bowel sounds, no organomegally, no rebound, no guarding Ext:  2 plus pulses, 1+ peripheral edema, no cyanosis, no clubbing; a one cm wound is present on inner aspect of right lower leg.  Skin:  No rashes no nodules Neuro:  CN II through XII intact, motor grossly intact  EKG - atrial fib with ventricular pacing and a paced QRS of 164.   DEVICE  Normal device function.  See PaceArt for details.   Assess/Plan: 1. Atrial fib - his VR is well controlled. He will continue his current meds. 2. Chronic systolic heart failure - he was class 2 before covid and worse but is now getting better. 3. CAD - he is s/p cabg and denies anginal symptoms.  4. Leg wound - he denies any trauma. He will be given an anti-biotic for treatment.   .

## 2020-03-24 NOTE — Patient Instructions (Addendum)
Medication Instructions:  Your physician has recommended you make the following change in your medication:  1 Start taking Bactrim DS 1 tablet by mouth twice a day for 5 days  *If you need a refill on your cardiac medications before your next appointment, please call your pharmacy*  Lab Work: None ordered.  If you have labs (blood work) drawn today and your tests are completely normal, you will receive your results only by:  MyChart Message (if you have MyChart) OR  A paper copy in the mail If you have any lab test that is abnormal or we need to change your treatment, we will call you to review the results.  Testing/Procedures: None ordered.  Follow-Up: At [facility], you and your health needs are our priority.  As part of our continuing mission to provide you with exceptional heart care, we have created designated Provider Care Teams.  These Care Teams include your primary Cardiologist (physician) and Advanced Practice Providers (APPs -  Physician Assistants and Nurse Practitioners) who all work together to provide you with the care you need, when you need it.  We recommend signing up for the patient portal called "MyChart".  Sign up information is provided on this After Visit Summary.  MyChart is used to connect with patients for Virtual Visits (Telemedicine).  Patients are able to view lab/test results, encounter notes, upcoming appointments, etc.  Non-urgent messages can be sent to your provider as well.   To learn more about what you can do with MyChart, go to [email or web address].    Your next appointment:   Your physician wants you to follow-up in: 6 months with Dr. Ladona Ridgel. You will receive a reminder letter in the mail two months in advance. If you don't receive a letter, please call our office to schedule the follow-up appointment.  Remote monitoring is used to monitor your ICD from home. This monitoring reduces the number of office visits required to check your device to  one time per year. It allows [place] to keep an eye on the functioning of your device to ensure it is working properly. You are scheduled for a device check from home on 05/28/20. You may send your transmission at any time that day. If you have a wireless device, the transmission will be sent automatically. After your physician reviews your transmission, you will receive a postcard with your next transmission date.  Other Instructions:

## 2020-03-27 NOTE — Addendum Note (Signed)
Addended by: Solon Augusta on: 03/27/2020 09:43 AM   Modules accepted: Orders

## 2020-04-16 ENCOUNTER — Encounter: Payer: Self-pay | Admitting: Physical Therapy

## 2020-04-16 ENCOUNTER — Ambulatory Visit: Payer: Medicare HMO | Attending: Nurse Practitioner | Admitting: Physical Therapy

## 2020-04-16 ENCOUNTER — Other Ambulatory Visit: Payer: Self-pay

## 2020-04-16 DIAGNOSIS — M545 Low back pain: Secondary | ICD-10-CM | POA: Insufficient documentation

## 2020-04-16 DIAGNOSIS — R2681 Unsteadiness on feet: Secondary | ICD-10-CM | POA: Diagnosis present

## 2020-04-16 DIAGNOSIS — M6281 Muscle weakness (generalized): Secondary | ICD-10-CM | POA: Diagnosis present

## 2020-04-16 DIAGNOSIS — G8929 Other chronic pain: Secondary | ICD-10-CM | POA: Diagnosis present

## 2020-04-16 NOTE — Therapy (Signed)
[facility] [address] [place], [place], [address] Phone: [phone]   Fax:  [phone]  Physical Therapy Evaluation  Patient Details  Name: Wayne Gordon MRN: [id number] Date of Birth: 1933/11/20 Referring Provider (PT): Christena Flake, NP   Encounter Date: 04/16/2020   PT End of Session - 04/16/20 2139    Visit Number 1    Number of Visits 12    Date for PT Re-Evaluation 06/04/20    Authorization Type [employer]; Progress note every 10th visit    PT Start Time 1345    PT Stop Time 1426    PT Time Calculation (min) 41 min    Activity Tolerance Patient tolerated treatment well    Behavior During Therapy [facility] for tasks assessed/performed           Past Medical History:  Diagnosis Date  . Allergy to ACE inhibitors    Angioedema many years ago; patient has tolerated Losartan (ARB) in the past - it was held during [facility] for GI bleed and worsening renal function >> resume Losartan 25 mg QD 06/2015  . Arthritis   . Atrial fibrillation (HCC)    PAF, CHADs2Vasc = 5  . Carotid artery disease (HCC)    60-79% LICA  . CHF (congestive heart failure) (HCC)   . CKD (chronic kidney disease)   . Coronary artery disease   . Diabetes mellitus   . GERD (gastroesophageal reflux disease)   . Hyperlipidemia   . Hypertension   . ICD (implantable cardiac defibrillator) in place 03/28/2012   Biotronik, Dr. Ladona Ridgel 03/28/12  . Ischemic cardiomyopathy    S/P CABG x 3; EF 25%  . Mitral regurgitation    S/P mitral valve repair 2013  . Myocardial infarction (HCC)   . Renal insufficiency     Past Surgical History:  Procedure Laterality Date  . CHOLECYSTECTOMY N/A 09/20/2017   Procedure: LAPAROSCOPIC SUBTOTAL CHOLECYSTECTOMY;  Surgeon: Axel Filler, MD;  Location: [facility] OR;  Service: General;  Laterality: N/A;  . COLONOSCOPY N/A 04/24/2014   RMR Pancolonic diverticulosis  . CORONARY ARTERY BYPASS GRAFT  10/17/2011   Procedure: CORONARY  ARTERY BYPASS GRAFTING (CABG);  Surgeon: Purcell Nails, MD;  Location: [facility] OR;  Service: Open Heart Surgery;  Laterality: N/A;  . ERCP N/A 09/29/2017   Procedure: ENDOSCOPIC RETROGRADE CHOLANGIOPANCREATOGRAPHY (ERCP);  Surgeon: Jeani Hawking, MD;  Location: [facility] ENDOSCOPY;  Service: Endoscopy;  Laterality: N/A;  . ESOPHAGOGASTRODUODENOSCOPY N/A 04/24/2014   RMR Subtle nodularity the gastric mucosa of uncertain significance-status post biopsy. Hiatal hernia. chronic inflammation, no H.pylori  . ESOPHAGOGASTRODUODENOSCOPY N/A 06/24/2016   Dr. Jena Gauss: normal esophagus, small hiatal hernia, normal duodenum  . ICD  03/28/2012  . IMPLANTABLE CARDIOVERTER DEFIBRILLATOR IMPLANT N/A 03/28/2012   Procedure: IMPLANTABLE CARDIOVERTER DEFIBRILLATOR IMPLANT;  Surgeon: Marinus Maw, MD;  Location: [facility] CATH LAB;  Service: Cardiovascular;  Laterality: N/A;  . KNEE ARTHROSCOPY  ~ 2008   right  . LEFT AND RIGHT HEART CATHETERIZATION WITH CORONARY ANGIOGRAM N/A 10/12/2011   Procedure: LEFT AND RIGHT HEART CATHETERIZATION WITH CORONARY ANGIOGRAM;  Surgeon: Iran Ouch, MD;  Location: [facility] CATH LAB;  Service: Cardiovascular;  Laterality: N/A;  . MITRAL VALVE REPAIR  10/17/2011   Procedure: MITRAL VALVE REPAIR (MVR);  Surgeon: Purcell Nails, MD;  Location: [facility] OR;  Service: Open Heart Surgery;  Laterality: N/A;  . NASAL SINUS SURGERY  1990's   right    There were no vitals filed for this visit.    Subjective  Assessment - 04/16/20 2107    Subjective COVID-19 screening performed upon arrival.Patient arrives to physical therapy with chronic history of low back pain, bilateral LE weakness, and decreased balance. Patient had one fall where he fell going up the steps. He had to call to gain assistance to standing. Patient reports pain with standing and walking. Patient able to perform ADLs but with pain. Patient's pain at worst rated 10/10 and pain at best as 1/10 with rest, Tylenol, and arthritis cream for his legs.  Patient's goals are to decrease pain, improve movement, improve strength, and improve balance.    Pertinent History Fall risk, Pacemaker/defribillator, HTN, DM, CKD, CHF, history of MI, rhizotomy per patient report    Limitations House hold activities;Walking;Standing;Sitting    How long can you sit comfortably? unlimited    How long can you stand comfortably? one minute    How long can you walk comfortably? 50-70 steps    Patient Stated Goals improve balance, strengthen legs    Currently in Pain? Yes    Pain Score 2     Pain Location Back    Pain Orientation Lower    Pain Descriptors / Indicators Tightness    Pain Type Chronic pain    Pain Radiating Towards bilateral LEs    Pain Onset More than a month ago    Pain Frequency Constant    Aggravating Factors  walking, standing    Pain Relieving Factors resting    Effect of Pain on Daily Activities pain with standing activities              [facility] PT Assessment - 04/16/20 0001      Assessment   Medical Diagnosis lumbar degenerative disc disease, other spondylosis with radiculopathy, lumbar region, spinal stenosis of lumbar region with neurogenic claudication, bilateral leg weakness, frequent falls    Referring Provider (PT) Christena Flake, NP    Onset Date/Surgical Date --   ongoing   Next MD Visit End of September    Prior Therapy no      Precautions   Precautions Fall      Restrictions   Weight Bearing Restrictions No      Balance Screen   Has the patient fallen in the past 6 months Yes    How many times? 1    Has the patient had a decrease in activity level because of a fear of falling?  Yes    Is the patient reluctant to leave their home because of a fear of falling?  Yes      Home Environment   Living Environment Private residence    Living Arrangements Spouse/significant other;Children    Home Access Ramped entrance      Prior Function   Level of Independence Independent with basic ADLs      Observation/Other  Assessments   Observations LE edema bilaterally      Posture/Postural Control   Posture/Postural Control Postural limitations    Postural Limitations Rounded Shoulders;Forward head;Increased thoracic kyphosis      ROM / Strength   AROM / PROM / Strength Strength      Strength   Strength Assessment Site Knee;Hip    Right/Left Hip Right;Left    Right Hip Flexion 3+/5    Right Hip ABduction 3+/5    Right Hip ADduction 3/5    Left Hip Flexion 3+/5    Left Hip ABduction 3+/5    Left Hip ADduction 3/5    Right/Left Knee Right;Left    Right Knee  Flexion 4-/5    Right Knee Extension 4/5    Left Knee Flexion 4-/5    Left Knee Extension 4/5      Palpation   Palpation comment increased tone in bilateral lumbar paraspinals and QL, possible lipoma in L QL region with reports of tenderness, pain with palpation with upper glutes      Transfers   Five time sit to stand comments  20.7 modified with UE support      Ambulation/Gait   Gait Pattern Step-through pattern;Decreased stride length;Decreased step length - right;Decreased step length - left;Decreased hip/knee flexion - right;Decreased hip/knee flexion - left;Trunk flexed;Wide base of support                      Objective measurements completed on examination: See above findings.                 PT Short Term Goals - 04/16/20 2143      PT SHORT TERM GOAL #1   Title STG=LTG             PT Long Term Goals - 04/16/20 2143      PT LONG TERM GOAL #1   Title Patient will be independent with HEP    Time 6    Period Weeks    Status New      PT LONG TERM GOAL #2   Title Patient will decrease risk of falls as noted by the ability to perform modified 5x sit to stand with UE support in 17 seconds or less.    Time 6    Period Weeks    Status New      PT LONG TERM GOAL #3   Title Patient will demonstrate 4/5 or greater bilateral LE MMT to improve stability during functional tasks.    Time 4    Period  Weeks    Status New                  Plan - 04/16/20 2140    Clinical Impression Statement Patient is an 86 year old male who presents to physical therapy with chronic low back pain, decreased bilateral LE MMT, and decreased balance. Patient's modified 5x sit to stand with UE support time of 20.7 seconds categorizes him as a fall risk with decreased functional LE strength. Patient noted with increased tone to bilateral lumbar paraspinals, QLs and upper gluteals. Possible lipoma palpated around L QL region. Patient and PT discussed plan of care and HEP to which patient reported understanding. Patient would benefit from skilled physical therapy to address deficits and patient's goals.    Personal Factors and Comorbidities Comorbidity 3+;Age;Time since onset of injury/illness/exacerbation    Comorbidities Fall risk, Pacemaker/defribillator, HTN, DM, CKD, CHF, history of MI    Examination-Activity Limitations Bathing;Stand;Stairs;Locomotion Level;Transfers    Stability/Clinical Decision Making Evolving/Moderate complexity    Clinical Decision Making Moderate    Rehab Potential Fair    PT Frequency 2x / week    PT Duration 6 weeks    PT Treatment/Interventions ADLs/Self Care Home Management;Cryotherapy;Ultrasound;Gait training;Stair training;Functional mobility training;Therapeutic activities;Therapeutic exercise;Balance training;Neuromuscular re-education;Manual techniques;Passive range of motion;Patient/family education    PT Next Visit Plan avoid supine (can lay with head of bed elevated), nustep, LE strengthening, attempt Berg Balance Scale, balance activities    PT Home Exercise Plan see patient education section    Consulted and Agree with Plan of Care Patient           Patient  will benefit from skilled therapeutic intervention in order to improve the following deficits and impairments:  Decreased strength, Decreased balance, Decreased activity tolerance, Abnormal gait, Difficulty  walking, Pain, Improper body mechanics, Postural dysfunction  Visit Diagnosis: Chronic bilateral low back pain, unspecified whether sciatica present - Plan: PT plan of care cert/re-cert  Unsteadiness on feet - Plan: PT plan of care cert/re-cert  Muscle weakness (generalized) - Plan: PT plan of care cert/re-cert     Problem List Patient Active Problem List   Diagnosis Date Noted  . Bloating 02/27/2020  . Nausea with vomiting 02/27/2020  . Abdominal pain 02/27/2020  . Iron deficiency anemia 02/08/2018  . Stage III chronic kidney disease 02/08/2018  . Diabetes mellitus (HCC) 02/08/2018  . 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  . Bacteremia due to Klebsiella pneumoniae 09/22/2017  . Sepsis due to Klebsiella pneumoniae (HCC) 09/22/2017  . Cholangitis 09/17/2017  . Leukocytosis 09/17/2017  . Cholelithiasis 09/17/2017  . Elevated liver enzymes 09/17/2017  . Acute renal failure superimposed on stage 3 chronic kidney disease (HCC) 09/17/2017  . Low blood pressure reading 09/17/2017  . Lactic acidosis 09/17/2017  . Elevated troponin 09/17/2017  . Nausea without vomiting 01/10/2017  . Gastrointestinal hemorrhage associated with intestinal diverticulosis 06/23/2016  . Carotid artery disease (HCC)   . Syncope and collapse 07/11/2015  . Ventricular arrhythmia 07/10/2015  . Complete heart block (HCC)   . ICD (implantable cardioverter-defibrillator) discharge   . Atrial fibrillation (HCC) 06/04/2015  . Diabetes (HCC) 06/02/2015  . 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/02/2015  . GERD (gastroesophageal reflux disease) 02/18/2015  . Abdominal pain, epigastric 02/18/2015  . Pleural effusion 09/01/2014  . Rectal bleeding 03/25/2014  . Acute blood loss anemia 03/25/2014  . Adult body mass index 28.0-28.9 09/30/2013  . Diabetes mellitus, type 2 (HCC) 08/09/2013  . Type 2 diabetes mellitus without complication (HCC) 08/09/2013  . PVC's (premature ventricular contractions) 07/04/2013  .  Hyperlipidemia 01/03/2013  . Automatic implantable cardioverter-defibrillator in situ 03/30/2012  . S/P MVR (mitral valve repair) 01/16/2012  . HTN (hypertension) 11/28/2011  . Chronic systolic heart failure (HCC) 10/31/2011  . Ischemic cardiomyopathy 10/31/2011  . CKD (chronic kidney disease) stage 3, GFR 30-59 ml/min (HCC) 10/31/2011  . Current use of long term anticoagulation 10/31/2011  . Long term (current) use of anticoagulants 10/28/2011  . S/P mitral valve repair 10/17/2011  . S/P CABG x 3 10/17/2011  . Mitral regurgitation 10/12/2011  . NSTEMI (non-ST elevated myocardial infarction) (HCC) 10/11/2011    Guss Bunde, PT, DPT 04/16/2020, 9:52 PM  [facility] [address] [place], [place], [address] Phone: [phone]   Fax:  [phone]  Name: Wayne Gordon MRN: [id number] Date of Birth: 03-23-1934

## 2020-04-21 ENCOUNTER — Other Ambulatory Visit: Payer: Self-pay

## 2020-04-21 ENCOUNTER — Ambulatory Visit: Payer: Medicare HMO | Admitting: *Deleted

## 2020-04-21 DIAGNOSIS — G8929 Other chronic pain: Secondary | ICD-10-CM

## 2020-04-21 DIAGNOSIS — M6281 Muscle weakness (generalized): Secondary | ICD-10-CM

## 2020-04-21 DIAGNOSIS — R2681 Unsteadiness on feet: Secondary | ICD-10-CM

## 2020-04-21 DIAGNOSIS — M545 Low back pain: Secondary | ICD-10-CM | POA: Diagnosis not present

## 2020-04-21 NOTE — Therapy (Signed)
[facility] [address] [place], [place], [address] Phone: [phone]   Fax:  [phone]  Physical Therapy Treatment  Patient Details  Name: Wayne Gordon MRN: [id number] Date of Birth: 1934/06/02 Referring Provider (PT): Christena Flake, NP   Encounter Date: 04/21/2020   PT End of Session - 04/21/20 1834    Visit Number 2    Number of Visits 12    Date for PT Re-Evaluation 06/04/20    Authorization Type [employer]; Progress note every 10th visit    PT Start Time 1345    PT Stop Time 1435    PT Time Calculation (min) 50 min           Past Medical History:  Diagnosis Date  . Allergy to ACE inhibitors    Angioedema many years ago; patient has tolerated Losartan (ARB) in the past - it was held during [facility] for GI bleed and worsening renal function >> resume Losartan 25 mg QD 06/2015  . Arthritis   . Atrial fibrillation (HCC)    PAF, CHADs2Vasc = 5  . Carotid artery disease (HCC)    60-79% LICA  . CHF (congestive heart failure) (HCC)   . CKD (chronic kidney disease)   . Coronary artery disease   . Diabetes mellitus   . GERD (gastroesophageal reflux disease)   . Hyperlipidemia   . Hypertension   . ICD (implantable cardiac defibrillator) in place 03/28/2012   Biotronik, Dr. Ladona Ridgel 03/28/12  . Ischemic cardiomyopathy    S/P CABG x 3; EF 25%  . Mitral regurgitation    S/P mitral valve repair 2013  . Myocardial infarction (HCC)   . Renal insufficiency     Past Surgical History:  Procedure Laterality Date  . CHOLECYSTECTOMY N/A 09/20/2017   Procedure: LAPAROSCOPIC SUBTOTAL CHOLECYSTECTOMY;  Surgeon: Axel Filler, MD;  Location: [facility] OR;  Service: General;  Laterality: N/A;  . COLONOSCOPY N/A 04/24/2014   RMR Pancolonic diverticulosis  . CORONARY ARTERY BYPASS GRAFT  10/17/2011   Procedure: CORONARY ARTERY BYPASS GRAFTING (CABG);  Surgeon: Purcell Nails, MD;  Location: [facility] OR;  Service: Open Heart Surgery;  Laterality:  N/A;  . ERCP N/A 09/29/2017   Procedure: ENDOSCOPIC RETROGRADE CHOLANGIOPANCREATOGRAPHY (ERCP);  Surgeon: Jeani Hawking, MD;  Location: [facility] ENDOSCOPY;  Service: Endoscopy;  Laterality: N/A;  . ESOPHAGOGASTRODUODENOSCOPY N/A 04/24/2014   RMR Subtle nodularity the gastric mucosa of uncertain significance-status post biopsy. Hiatal hernia. chronic inflammation, no H.pylori  . ESOPHAGOGASTRODUODENOSCOPY N/A 06/24/2016   Dr. Jena Gauss: normal esophagus, small hiatal hernia, normal duodenum  . ICD  03/28/2012  . IMPLANTABLE CARDIOVERTER DEFIBRILLATOR IMPLANT N/A 03/28/2012   Procedure: IMPLANTABLE CARDIOVERTER DEFIBRILLATOR IMPLANT;  Surgeon: Marinus Maw, MD;  Location: [facility] CATH LAB;  Service: Cardiovascular;  Laterality: N/A;  . KNEE ARTHROSCOPY  ~ 2008   right  . LEFT AND RIGHT HEART CATHETERIZATION WITH CORONARY ANGIOGRAM N/A 10/12/2011   Procedure: LEFT AND RIGHT HEART CATHETERIZATION WITH CORONARY ANGIOGRAM;  Surgeon: Iran Ouch, MD;  Location: [facility] CATH LAB;  Service: Cardiovascular;  Laterality: N/A;  . MITRAL VALVE REPAIR  10/17/2011   Procedure: MITRAL VALVE REPAIR (MVR);  Surgeon: Purcell Nails, MD;  Location: [facility] OR;  Service: Open Heart Surgery;  Laterality: N/A;  . NASAL SINUS SURGERY  1990's   right    There were no vitals filed for this visit.   Subjective Assessment - 04/21/20 1352    Subjective COVID-19 screening performed upon arrival. LBP is worse when standing and walking.  2-3/10 today.    Pertinent History Fall risk, Pacemaker/defribillator, HTN, DM, CKD, CHF, history of MI, rhizotomy per patient report    Limitations House hold activities;Walking;Standing;Sitting    How long can you sit comfortably? unlimited    How long can you stand comfortably? one minute    How long can you walk comfortably? 50-70 steps    Patient Stated Goals improve balance, strengthen legs    Currently in Pain? Yes    Pain Score 3     Pain Location Back    Pain Orientation Lower    Pain Descriptors  / Indicators Tightness                             [facility] Adult PT Treatment/Exercise - 04/21/20 0001      Exercises   Exercises Knee/Hip;Lumbar      Lumbar Exercises: Aerobic   Nustep L3 x 10 mins      Knee/Hip Exercises: Seated   Long Arc Quad Both;2 sets;10 reps    Long Arc Quad Weight 2 lbs.    Ball Squeeze 2x10 hold 5 secs    Clamshell with TheraBand Red   2x10 hold 5 secs   Hamstring Curl Both;2 sets;10 reps                    PT Short Term Goals - 04/16/20 2143      PT SHORT TERM GOAL #1   Title STG=LTG             PT Long Term Goals - 04/16/20 2143      PT LONG TERM GOAL #1   Title Patient will be independent with HEP    Time 6    Period Weeks    Status New      PT LONG TERM GOAL #2   Title Patient will decrease risk of falls as noted by the ability to perform modified 5x sit to stand with UE support in 17 seconds or less.    Time 6    Period Weeks    Status New      PT LONG TERM GOAL #3   Title Patient will demonstrate 4/5 or greater bilateral LE MMT to improve stability during functional tasks.    Time 4    Period Weeks    Status New                 Plan - 04/21/20 1837    Clinical Impression Statement Pt arrived today doing fairly well with minimal LBP. Rx focused on LE conditioning and strengthening and was tolerated well. Pt reports mainly fatigue and some LB tightness end of session    Personal Factors and Comorbidities Comorbidity 3+;Age;Time since onset of injury/illness/exacerbation    Comorbidities Fall risk, Pacemaker/defribillator, HTN, DM, CKD, CHF, history of MI    Examination-Activity Limitations Bathing;Stand;Stairs;Locomotion Level;Transfers    Rehab Potential Fair    PT Frequency 2x / week    PT Duration 6 weeks    PT Treatment/Interventions ADLs/Self Care Home Management;Cryotherapy;Ultrasound;Gait training;Stair training;Functional mobility training;Therapeutic activities;Therapeutic  exercise;Balance training;Neuromuscular re-education;Manual techniques;Passive range of motion;Patient/family education    PT Next Visit Plan avoid supine (can lay with head of bed elevated), nustep, LE strengthening, attempt Berg Balance Scale, balance activities    Consulted and Agree with Plan of Care Patient           Patient will benefit from skilled therapeutic intervention in order to  improve the following deficits and impairments:  Decreased strength, Decreased balance, Decreased activity tolerance, Abnormal gait, Difficulty walking, Pain, Improper body mechanics, Postural dysfunction  Visit Diagnosis: Chronic bilateral low back pain, unspecified whether sciatica present  Unsteadiness on feet  Muscle weakness (generalized)     Problem List Patient Active Problem List   Diagnosis Date Noted  . Bloating 02/27/2020  . Nausea with vomiting 02/27/2020  . Abdominal pain 02/27/2020  . Iron deficiency anemia 02/08/2018  . Stage III chronic kidney disease 02/08/2018  . Diabetes mellitus (HCC) 02/08/2018  . 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  . Bacteremia due to Klebsiella pneumoniae 09/22/2017  . Sepsis due to Klebsiella pneumoniae (HCC) 09/22/2017  . Cholangitis 09/17/2017  . Leukocytosis 09/17/2017  . Cholelithiasis 09/17/2017  . Elevated liver enzymes 09/17/2017  . Acute renal failure superimposed on stage 3 chronic kidney disease (HCC) 09/17/2017  . Low blood pressure reading 09/17/2017  . Lactic acidosis 09/17/2017  . Elevated troponin 09/17/2017  . Nausea without vomiting 01/10/2017  . Gastrointestinal hemorrhage associated with intestinal diverticulosis 06/23/2016  . Carotid artery disease (HCC)   . Syncope and collapse 07/11/2015  . Ventricular arrhythmia 07/10/2015  . Complete heart block (HCC)   . ICD (implantable cardioverter-defibrillator) discharge   . Atrial fibrillation (HCC) 06/04/2015  . Diabetes (HCC) 06/02/2015  . Gonadotropin deficiency (HCC)  06/02/2015  . GERD (gastroesophageal reflux disease) 02/18/2015  . Abdominal pain, epigastric 02/18/2015  . Pleural effusion 09/01/2014  . Rectal bleeding 03/25/2014  . Acute blood loss anemia 03/25/2014  . Adult body mass index 28.0-28.9 09/30/2013  . Diabetes mellitus, type 2 (HCC) 08/09/2013  . Type 2 diabetes mellitus without complication (HCC) 08/09/2013  . PVC's (premature ventricular contractions) 07/04/2013  . Hyperlipidemia 01/03/2013  . Automatic implantable cardioverter-defibrillator in situ 03/30/2012  . S/P MVR (mitral valve repair) 01/16/2012  . HTN (hypertension) 11/28/2011  . Chronic systolic heart failure (HCC) 10/31/2011  . Ischemic cardiomyopathy 10/31/2011  . CKD (chronic kidney disease) stage 3, GFR 30-59 ml/min (HCC) 10/31/2011  . Current use of long term anticoagulation 10/31/2011  . Long term (current) use of anticoagulants 10/28/2011  . S/P mitral valve repair 10/17/2011  . S/P CABG x 3 10/17/2011  . Mitral regurgitation 10/12/2011  . NSTEMI (non-ST elevated myocardial infarction) (HCC) 10/11/2011    Jelicia Nantz,CHRIS, PTA 04/21/2020, 6:39 PM  [facility] [address] [place], [place], [address] Phone: [phone]   Fax:  [phone]  Name: Wayne Gordon MRN: [id number] Date of Birth: [birth year]

## 2020-04-22 ENCOUNTER — Ambulatory Visit: Payer: Medicare HMO | Admitting: Gastroenterology

## 2020-04-23 ENCOUNTER — Ambulatory Visit: Payer: Medicare HMO | Admitting: *Deleted

## 2020-04-23 ENCOUNTER — Other Ambulatory Visit: Payer: Self-pay

## 2020-04-23 DIAGNOSIS — M545 Low back pain: Secondary | ICD-10-CM | POA: Diagnosis not present

## 2020-04-23 DIAGNOSIS — R2681 Unsteadiness on feet: Secondary | ICD-10-CM

## 2020-04-23 DIAGNOSIS — G8929 Other chronic pain: Secondary | ICD-10-CM

## 2020-04-23 NOTE — Therapy (Signed)
[facility] [address] [place], [place], [address] Phone: [phone]   Fax:  [phone]  Physical Therapy Treatment  Patient Details  Name: Wayne Gordon MRN: [id number] Date of Birth: 1934/06/18 Referring Provider (PT): Christena Flake, NP   Encounter Date: 04/23/2020   PT End of Session - 04/23/20 1356    Visit Number 3    Number of Visits 12    Date for PT Re-Evaluation 06/04/20    Authorization Type [employer]; Progress note every 10th visit    PT Start Time 1345    PT Stop Time 1433    PT Time Calculation (min) 48 min    Activity Tolerance Patient tolerated treatment well           Past Medical History:  Diagnosis Date  . Allergy to ACE inhibitors    Angioedema many years ago; patient has tolerated Losartan (ARB) in the past - it was held during [facility] for GI bleed and worsening renal function >> resume Losartan 25 mg QD 06/2015  . Arthritis   . Atrial fibrillation (HCC)    PAF, CHADs2Vasc = 5  . Carotid artery disease (HCC)    60-79% LICA  . CHF (congestive heart failure) (HCC)   . CKD (chronic kidney disease)   . Coronary artery disease   . Diabetes mellitus   . GERD (gastroesophageal reflux disease)   . Hyperlipidemia   . Hypertension   . ICD (implantable cardiac defibrillator) in place 03/28/2012   Biotron[place], Dr. Ladona Ridgel 03/28/12  . Ischemic cardiomyopathy    S/P CABG x 3; EF 25%  . Mitral regurgitation    S/P mitral valve repair 2013  . Myocardial infarction (HCC)   . Renal insufficiency     Past Surgical History:  Procedure Laterality Date  . CHOLECYSTECTOMY N/A 09/20/2017   Procedure: LAPAROSCOPIC SUBTOTAL CHOLECYSTECTOMY;  Surgeon: Axel Filler, MD;  Location: [facility] OR;  Service: General;  Laterality: N/A;  . COLONOSCOPY N/A 04/24/2014   RMR Pancolonic diverticulosis  . CORONARY ARTERY BYPASS GRAFT  10/17/2011   Procedure: CORONARY ARTERY BYPASS GRAFTING (CABG);  Surgeon: Purcell Nails, MD;   Location: [facility] OR;  Service: Open Heart Surgery;  Laterality: N/A;  . ERCP N/A 09/29/2017   Procedure: ENDOSCOPIC RETROGRADE CHOLANGIOPANCREATOGRAPHY (ERCP);  Surgeon: Jeani Hawking, MD;  Location: [facility] ENDOSCOPY;  Service: Endoscopy;  Laterality: N/A;  . ESOPHAGOGASTRODUODENOSCOPY N/A 04/24/2014   RMR Subtle nodularity the gastric mucosa of uncertain significance-status post biopsy. Hiatal hernia. chronic inflammation, no H.pylori  . ESOPHAGOGASTRODUODENOSCOPY N/A 06/24/2016   Dr. Jena Gauss: normal esophagus, small hiatal hernia, normal duodenum  . ICD  03/28/2012  . IMPLANTABLE CARDIOVERTER DEFIBRILLATOR IMPLANT N/A 03/28/2012   Procedure: IMPLANTABLE CARDIOVERTER DEFIBRILLATOR IMPLANT;  Surgeon: Marinus Maw, MD;  Location: [facility] CATH LAB;  Service: Cardiovascular;  Laterality: N/A;  . KNEE ARTHROSCOPY  ~ 2008   right  . LEFT AND RIGHT HEART CATHETERIZATION WITH CORONARY ANGIOGRAM N/A 10/12/2011   Procedure: LEFT AND RIGHT HEART CATHETERIZATION WITH CORONARY ANGIOGRAM;  Surgeon: Iran Ouch, MD;  Location: [facility] CATH LAB;  Service: Cardiovascular;  Laterality: N/A;  . MITRAL VALVE REPAIR  10/17/2011   Procedure: MITRAL VALVE REPAIR (MVR);  Surgeon: Purcell Nails, MD;  Location: [facility] OR;  Service: Open Heart Surgery;  Laterality: N/A;  . NASAL SINUS SURGERY  1990's   right    There were no vitals filed for this visit.   Subjective Assessment - 04/23/20 1353    Subjective COVID-19 screening performed  upon arrival. LBP  2-3/10 today.  Sore from last Rx    Pertinent History Fall risk, Pacemaker/defribillator, HTN, DM, CKD, CHF, history of MI, rhizotomy per patient report    Limitations House hold activities;Walking;Standing;Sitting    How long can you sit comfortably? unlimited    How long can you stand comfortably? one minute    How long can you walk comfortably? 50-70 steps    Patient Stated Goals improve balance, strengthen legs    Currently in Pain? Yes    Pain Score 3     Pain Location Back     Pain Orientation Lower    Pain Descriptors / Indicators Tightness    Pain Type Chronic pain    Pain Onset More than a month ago                             [facility] Adult PT Treatment/Exercise - 04/23/20 0001      Exercises   Exercises Knee/Hip;Lumbar      Lumbar Exercises: Aerobic   Nustep L3 x 12 mins with focus on posture      Knee/Hip Exercises: Standing   Rocker Board 3 minutes   DF/ PF and calf stretch     Knee/Hip Exercises: Seated   Long Arc Quad Both;2 sets;10 reps    Long Arc Quad Weight 2 lbs.    Ball Squeeze 2x10 hold 5 secs    Clamshell with TheraBand Red   2x10 hold 5 secs   Hamstring Curl Both;2 sets;10 reps   Red tband                   PT Short Term Goals - 04/16/20 2143      PT SHORT TERM GOAL #1   Title STG=LTG             PT Long Term Goals - 04/16/20 2143      PT LONG TERM GOAL #1   Title Patient will be independent with HEP    Time 6    Period Weeks    Status New      PT LONG TERM GOAL #2   Title Patient will decrease risk of falls as noted by the ability to perform modified 5x sit to stand with UE support in 17 seconds or less.    Time 6    Period Weeks    Status New      PT LONG TERM GOAL #3   Title Patient will demonstrate 4/5 or greater bilateral LE MMT to improve stability during functional tasks.    Time 4    Period Weeks    Status New                 Plan - 04/23/20 1450    Clinical Impression Statement Pt arrived today reporting doing ok after last Rx with mainly soreness in LB and LEs.  He was able to complete therex/ act.'s as well as added exs and did well with some LB  tightness that resoled before end of session.    Personal Factors and Comorbidities Comorbidity 3+;Age;Time since onset of injury/illness/exacerbation    Comorbidities Fall risk, Pacemaker/defribillator, HTN, DM, CKD, CHF, history of MI    Examination-Activity Limitations Bathing;Stand;Stairs;Locomotion Level;Transfers     Stability/Clinical Decision Making Evolving/Moderate complexity    Rehab Potential Fair    PT Frequency 2x / week    PT Duration 6 weeks    PT Treatment/Interventions  ADLs/Self Care Home Management;Cryotherapy;Ultrasound;Gait training;Stair training;Functional mobility training;Therapeutic activities;Therapeutic exercise;Balance training;Neuromuscular re-education;Manual techniques;Passive range of motion;Patient/family education    PT Next Visit Plan avoid supine (can lay with head of bed elevated), nustep, LE strengthening, attempt Berg Balance Scale, balance activities    Consulted and Agree with Plan of Care Patient           Patient will benefit from skilled therapeutic intervention in order to improve the following deficits and impairments:     Visit Diagnosis: Chronic bilateral low back pain, unspecified whether sciatica present  Unsteadiness on feet     Problem List Patient Active Problem List   Diagnosis Date Noted  . Bloating 02/27/2020  . Nausea with vomiting 02/27/2020  . Abdominal pain 02/27/2020  . Iron deficiency anemia 02/08/2018  . Stage III chronic kidney disease 02/08/2018  . Diabetes mellitus (HCC) 02/08/2018  . 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  . Bacteremia due to Klebsiella pneumoniae 09/22/2017  . Sepsis due to Klebsiella pneumoniae (HCC) 09/22/2017  . Cholangitis 09/17/2017  . Leukocytosis 09/17/2017  . Cholelithiasis 09/17/2017  . Elevated liver enzymes 09/17/2017  . Acute renal failure superimposed on stage 3 chronic kidney disease (HCC) 09/17/2017  . Low blood pressure reading 09/17/2017  . Lactic acidosis 09/17/2017  . Elevated troponin 09/17/2017  . Nausea without vomiting 01/10/2017  . Gastrointestinal hemorrhage associated with intestinal diverticulosis 06/23/2016  . Carotid artery disease (HCC)   . Syncope and collapse 07/11/2015  . Ventricular arrhythmia 07/10/2015  . Complete heart block (HCC)   . ICD (implantable  cardioverter-defibrillator) discharge   . Atrial fibrillation (HCC) 06/04/2015  . Diabetes (HCC) 06/02/2015  . 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/02/2015  . GERD (gastroesophageal reflux disease) 02/18/2015  . Abdominal pain, epigastric 02/18/2015  . Pleural effusion 09/01/2014  . Rectal bleeding 03/25/2014  . Acute blood loss anemia 03/25/2014  . Adult body mass index 28.0-28.9 09/30/2013  . Diabetes mellitus, type 2 (HCC) 08/09/2013  . Type 2 diabetes mellitus without complication (HCC) 08/09/2013  . PVC's (premature ventricular contractions) 07/04/2013  . Hyperlipidemia 01/03/2013  . Automatic implantable cardioverter-defibrillator in situ 03/30/2012  . S/P MVR (mitral valve repair) 01/16/2012  . HTN (hypertension) 11/28/2011  . Chronic systolic heart failure (HCC) 10/31/2011  . Ischemic cardiomyopathy 10/31/2011  . CKD (chronic kidney disease) stage 3, GFR 30-59 ml/min (HCC) 10/31/2011  . Current use of long term anticoagulation 10/31/2011  . Long term (current) use of anticoagulants 10/28/2011  . S/P mitral valve repair 10/17/2011  . S/P CABG x 3 10/17/2011  . Mitral regurgitation 10/12/2011  . NSTEMI (non-ST elevated myocardial infarction) (HCC) 10/11/2011    Satoru Milich,CHRIS, PTA 04/23/2020, 2:53 PM  [facility] [address] [place], [place], [address] Phone: [phone]   Fax:  [phone]  Name: LANEY BAGSHAW MRN: [id number] Date of Birth: February 20, 1934

## 2020-04-28 ENCOUNTER — Other Ambulatory Visit: Payer: Self-pay

## 2020-04-28 ENCOUNTER — Ambulatory Visit: Payer: Medicare HMO | Admitting: *Deleted

## 2020-04-28 DIAGNOSIS — G8929 Other chronic pain: Secondary | ICD-10-CM

## 2020-04-28 DIAGNOSIS — M545 Low back pain: Secondary | ICD-10-CM | POA: Diagnosis not present

## 2020-04-28 DIAGNOSIS — R2681 Unsteadiness on feet: Secondary | ICD-10-CM

## 2020-04-28 DIAGNOSIS — M6281 Muscle weakness (generalized): Secondary | ICD-10-CM

## 2020-04-28 NOTE — Therapy (Signed)
[facility] [address] [place], [place], [address] Phone: [phone]   Fax:  [phone]  Physical Therapy Treatment  Patient Details  Name: Wayne Gordon MRN: [id number] Date of Birth: 1933-10-11 Referring Provider (PT): Christena Flake, NP   Encounter Date: 04/28/2020   PT End of Session - 04/28/20 1434    Visit Number 4    Number of Visits 12    Date for PT Re-Evaluation 06/04/20    Authorization Type [employer]; Progress note every 10th visit    PT Start Time 1345    PT Stop Time 1433    PT Time Calculation (min) 48 min           Past Medical History:  Diagnosis Date  . Allergy to ACE inhibitors    Angioedema many years ago; patient has tolerated Losartan (ARB) in the past - it was held during [facility] for GI bleed and worsening renal function >> resume Losartan 25 mg QD 06/2015  . Arthritis   . Atrial fibrillation (HCC)    PAF, CHADs2Vasc = 5  . Carotid artery disease (HCC)    60-79% LICA  . CHF (congestive heart failure) (HCC)   . CKD (chronic kidney disease)   . Coronary artery disease   . Diabetes mellitus   . GERD (gastroesophageal reflux disease)   . Hyperlipidemia   . Hypertension   . ICD (implantable cardiac defibrillator) in place 03/28/2012   [place]ron[place], Dr. Ladona Ridgel 03/28/12  . Ischemic cardiomyopathy    S/P CABG x 3; EF 25%  . Mitral regurgitation    S/P mitral valve repair 2013  . Myocardial infarction (HCC)   . Renal insufficiency     Past Surgical History:  Procedure Laterality Date  . CHOLECYSTECTOMY N/A 09/20/2017   Procedure: LAPAROSCOPIC SUBTOTAL CHOLECYSTECTOMY;  Surgeon: Axel Filler, MD;  Location: [facility] OR;  Service: General;  Laterality: N/A;  . COLONOSCOPY N/A 04/24/2014   RMR Pancolonic diverticulosis  . CORONARY ARTERY BYPASS GRAFT  10/17/2011   Procedure: CORONARY ARTERY BYPASS GRAFTING (CABG);  Surgeon: Purcell Nails, MD;  Location: [facility] OR;  Service: Open Heart Surgery;  Laterality:  N/A;  . ERCP N/A 09/29/2017   Procedure: ENDOSCOPIC RETROGRADE CHOLANGIOPANCREATOGRAPHY (ERCP);  Surgeon: Jeani Hawking, MD;  Location: [facility] ENDOSCOPY;  Service: Endoscopy;  Laterality: N/A;  . ESOPHAGOGASTRODUODENOSCOPY N/A 04/24/2014   RMR Subtle nodularity the gastric mucosa of uncertain significance-status post biopsy. Hiatal hernia. chronic inflammation, no H.pylori  . ESOPHAGOGASTRODUODENOSCOPY N/A 06/24/2016   Dr. Jena Gauss: normal esophagus, small hiatal hernia, normal duodenum  . ICD  03/28/2012  . IMPLANTABLE CARDIOVERTER DEFIBRILLATOR IMPLANT N/A 03/28/2012   Procedure: IMPLANTABLE CARDIOVERTER DEFIBRILLATOR IMPLANT;  Surgeon: Marinus Maw, MD;  Location: [facility] CATH LAB;  Service: Cardiovascular;  Laterality: N/A;  . KNEE ARTHROSCOPY  ~ 2008   right  . LEFT AND RIGHT HEART CATHETERIZATION WITH CORONARY ANGIOGRAM N/A 10/12/2011   Procedure: LEFT AND RIGHT HEART CATHETERIZATION WITH CORONARY ANGIOGRAM;  Surgeon: Iran Ouch, MD;  Location: [facility] CATH LAB;  Service: Cardiovascular;  Laterality: N/A;  . MITRAL VALVE REPAIR  10/17/2011   Procedure: MITRAL VALVE REPAIR (MVR);  Surgeon: Purcell Nails, MD;  Location: [facility] OR;  Service: Open Heart Surgery;  Laterality: N/A;  . NASAL SINUS SURGERY  1990's   right    There were no vitals filed for this visit.   Subjective Assessment - 04/28/20 1402    Subjective COVID-19 screening performed upon arrival. LBP  2-3/10 today.  Having  the nerve burn again end of Sept.    Pertinent History Fall risk, Pacemaker/defribillator, HTN, DM, CKD, CHF, history of MI, rhizotomy per patient report    Limitations House hold activities;Walking;Standing;Sitting    How long can you sit comfortably? unlimited    How long can you stand comfortably? one minute    How long can you walk comfortably? 50-70 steps    Patient Stated Goals improve balance, strengthen legs    Currently in Pain? Yes    Pain Score 4     Pain Location Back    Pain Orientation Lower    Pain  Descriptors / Indicators Tightness    Pain Type Chronic pain    Pain Onset More than a month ago                             [facility] Adult PT Treatment/Exercise - 04/28/20 0001      Exercises   Exercises Knee/Hip;Lumbar      Lumbar Exercises: Aerobic   Nustep L4 x 15 mins with focus on posture      Knee/Hip Exercises: Standing   Rocker Board 3 minutes   DF/ PF and calf stretch     Knee/Hip Exercises: Seated   Long Arc Quad Both;10 reps;3 sets    [employer] Weight 3 lbs.    Ball Squeeze 2x10 hold 5 secs    Clamshell with TheraBand Red   2x10 hold 5 secs   Hamstring Curl Both;2 sets;15 reps   Red tband                   PT Short Term Goals - 04/16/20 2143      PT SHORT TERM GOAL #1   Title STG=LTG             PT Long Term Goals - 04/16/20 2143      PT LONG TERM GOAL #1   Title Patient will be independent with HEP    Time 6    Period Weeks    Status New      PT LONG TERM GOAL #2   Title Patient will decrease risk of falls as noted by the ability to perform modified 5x sit to stand with UE support in 17 seconds or less.    Time 6    Period Weeks    Status New      PT LONG TERM GOAL #3   Title Patient will demonstrate 4/5 or greater bilateral LE MMT to improve stability during functional tasks.    Time 4    Period Weeks    Status New                 Plan - 04/28/20 1441    Clinical Impression Statement Pt arrived today doing ok, but reports being sore after last Rx. He was able to perform LE strengthening as well as core exs with progression of sets, but mainly seated due to increased pain in standing. He tolerated standing rockerboard exs today without complaints.    Personal Factors and Comorbidities Comorbidity 3+;Age;Time since onset of injury/illness/exacerbation    Comorbidities Fall risk, Pacemaker/defribillator, HTN, DM, CKD, CHF, history of MI    Examination-Activity Limitations Bathing;Stand;Stairs;Locomotion  Level;Transfers    Stability/Clinical Decision Making Evolving/Moderate complexity    Rehab Potential Fair    PT Frequency 2x / week    PT Duration 6 weeks    PT Next Visit Plan  avoid supine (can lay with head of bed elevated), nustep, LE strengthening, attempt Berg Balance Scale, balance activities    PT Home Exercise Plan see patient education section    Consulted and Agree with Plan of Care Patient           Patient will benefit from skilled therapeutic intervention in order to improve the following deficits and impairments:  Decreased strength, Decreased balance, Decreased activity tolerance, Abnormal gait, Difficulty walking, Pain, Improper body mechanics, Postural dysfunction  Visit Diagnosis: Chronic bilateral low back pain, unspecified whether sciatica present  Unsteadiness on feet  Muscle weakness (generalized)     Problem List Patient Active Problem List   Diagnosis Date Noted  . Bloating 02/27/2020  . Nausea with vomiting 02/27/2020  . Abdominal pain 02/27/2020  . Iron deficiency anemia 02/08/2018  . Stage III chronic kidney disease 02/08/2018  . Diabetes mellitus (HCC) 02/08/2018  . 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  . Bacteremia due to Klebsiella pneumoniae 09/22/2017  . Sepsis due to Klebsiella pneumoniae (HCC) 09/22/2017  . Cholangitis 09/17/2017  . Leukocytosis 09/17/2017  . Cholelithiasis 09/17/2017  . Elevated liver enzymes 09/17/2017  . Acute renal failure superimposed on stage 3 chronic kidney disease (HCC) 09/17/2017  . Low blood pressure reading 09/17/2017  . Lactic acidosis 09/17/2017  . Elevated troponin 09/17/2017  . Nausea without vomiting 01/10/2017  . Gastrointestinal hemorrhage associated with intestinal diverticulosis 06/23/2016  . Carotid artery disease (HCC)   . Syncope and collapse 07/11/2015  . Ventricular arrhythmia 07/10/2015  . Complete heart block (HCC)   . ICD (implantable cardioverter-defibrillator) discharge   . Atrial  fibrillation (HCC) 06/04/2015  . Diabetes (HCC) 06/02/2015  . 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/02/2015  . GERD (gastroesophageal reflux disease) 02/18/2015  . Abdominal pain, epigastric 02/18/2015  . Pleural effusion 09/01/2014  . Rectal bleeding 03/25/2014  . Acute blood loss anemia 03/25/2014  . Adult body mass index 28.0-28.9 09/30/2013  . Diabetes mellitus, type 2 (HCC) 08/09/2013  . Type 2 diabetes mellitus without complication (HCC) 08/09/2013  . PVC's (premature ventricular contractions) 07/04/2013  . Hyperlipidemia 01/03/2013  . Automatic implantable cardioverter-defibrillator in situ 03/30/2012  . S/P MVR (mitral valve repair) 01/16/2012  . HTN (hypertension) 11/28/2011  . Chronic systolic heart failure (HCC) 10/31/2011  . Ischemic cardiomyopathy 10/31/2011  . CKD (chronic kidney disease) stage 3, GFR 30-59 ml/min (HCC) 10/31/2011  . Current use of long term anticoagulation 10/31/2011  . Long term (current) use of anticoagulants 10/28/2011  . S/P mitral valve repair 10/17/2011  . S/P CABG x 3 10/17/2011  . Mitral regurgitation 10/12/2011  . NSTEMI (non-ST elevated myocardial infarction) (HCC) 10/11/2011    Buster Schueller,CHRIS, PTA 04/28/2020, 2:48 PM  [facility] [address] [place], [place], [address] Phone: [phone]   Fax:  [phone]  Name: Wayne Gordon MRN: [id number] Date of Birth: 02-01-1934

## 2020-04-30 ENCOUNTER — Encounter: Payer: Medicare HMO | Admitting: *Deleted

## 2020-05-06 ENCOUNTER — Ambulatory Visit: Payer: Medicare HMO | Admitting: Physical Therapy

## 2020-05-07 ENCOUNTER — Encounter: Payer: Self-pay | Admitting: *Deleted

## 2020-05-07 ENCOUNTER — Ambulatory Visit: Payer: Medicare HMO | Attending: Nurse Practitioner | Admitting: *Deleted

## 2020-05-07 ENCOUNTER — Other Ambulatory Visit: Payer: Self-pay

## 2020-05-07 DIAGNOSIS — G8929 Other chronic pain: Secondary | ICD-10-CM | POA: Insufficient documentation

## 2020-05-07 DIAGNOSIS — M6281 Muscle weakness (generalized): Secondary | ICD-10-CM | POA: Diagnosis present

## 2020-05-07 DIAGNOSIS — R2681 Unsteadiness on feet: Secondary | ICD-10-CM

## 2020-05-07 DIAGNOSIS — M545 Low back pain, unspecified: Secondary | ICD-10-CM

## 2020-05-07 NOTE — Therapy (Signed)
[facility] [address] [place], [place], [address] Phone: [phone]   Fax:  [phone]  Physical Therapy Treatment  Patient Details  Name: Wayne Gordon MRN: [id number] Date of Birth: 19-Jun-1934 Referring Provider (PT): Christena Flake, NP   Encounter Date: 05/07/2020   PT End of Session - 05/07/20 1351    Visit Number 5    Number of Visits 12    Date for PT Re-Evaluation 06/04/20    Authorization Type [employer]; Progress note every 10th visit    PT Start Time 1346    PT Stop Time 1432    PT Time Calculation (min) 46 min           Past Medical History:  Diagnosis Date  . Allergy to ACE inhibitors    Angioedema many years ago; patient has tolerated Losartan (ARB) in the past - it was held during [facility] for GI bleed and worsening renal function >> resume Losartan 25 mg QD 06/2015  . Arthritis   . Atrial fibrillation (HCC)    PAF, CHADs2Vasc = 5  . Carotid artery disease (HCC)    60-79% LICA  . CHF (congestive heart failure) (HCC)   . CKD (chronic kidney disease)   . Coronary artery disease   . Diabetes mellitus   . GERD (gastroesophageal reflux disease)   . Hyperlipidemia   . Hypertension   . ICD (implantable cardiac defibrillator) in place 03/28/2012   Biotron[place], Dr. Ladona Ridgel 03/28/12  . Ischemic cardiomyopathy    S/P CABG x 3; EF 25%  . Mitral regurgitation    S/P mitral valve repair 2013  . Myocardial infarction (HCC)   . Renal insufficiency     Past Surgical History:  Procedure Laterality Date  . CHOLECYSTECTOMY N/A 09/20/2017   Procedure: LAPAROSCOPIC SUBTOTAL CHOLECYSTECTOMY;  Surgeon: Axel Filler, MD;  Location: [facility] OR;  Service: General;  Laterality: N/A;  . COLONOSCOPY N/A 04/24/2014   RMR Pancolonic diverticulosis  . CORONARY ARTERY BYPASS GRAFT  10/17/2011   Procedure: CORONARY ARTERY BYPASS GRAFTING (CABG);  Surgeon: Purcell Nails, MD;  Location: [facility] OR;  Service: Open Heart Surgery;  Laterality:  N/A;  . ERCP N/A 09/29/2017   Procedure: ENDOSCOPIC RETROGRADE CHOLANGIOPANCREATOGRAPHY (ERCP);  Surgeon: Jeani Hawking, MD;  Location: [facility] ENDOSCOPY;  Service: Endoscopy;  Laterality: N/A;  . ESOPHAGOGASTRODUODENOSCOPY N/A 04/24/2014   RMR Subtle nodularity the gastric mucosa of uncertain significance-status post biopsy. Hiatal hernia. chronic inflammation, no H.pylori  . ESOPHAGOGASTRODUODENOSCOPY N/A 06/24/2016   Dr. Jena Gauss: normal esophagus, small hiatal hernia, normal duodenum  . ICD  03/28/2012  . IMPLANTABLE CARDIOVERTER DEFIBRILLATOR IMPLANT N/A 03/28/2012   Procedure: IMPLANTABLE CARDIOVERTER DEFIBRILLATOR IMPLANT;  Surgeon: Marinus Maw, MD;  Location: [facility] CATH LAB;  Service: Cardiovascular;  Laterality: N/A;  . KNEE ARTHROSCOPY  ~ 2008   right  . LEFT AND RIGHT HEART CATHETERIZATION WITH CORONARY ANGIOGRAM N/A 10/12/2011   Procedure: LEFT AND RIGHT HEART CATHETERIZATION WITH CORONARY ANGIOGRAM;  Surgeon: Iran Ouch, MD;  Location: [facility] CATH LAB;  Service: Cardiovascular;  Laterality: N/A;  . MITRAL VALVE REPAIR  10/17/2011   Procedure: MITRAL VALVE REPAIR (MVR);  Surgeon: Purcell Nails, MD;  Location: [facility] OR;  Service: Open Heart Surgery;  Laterality: N/A;  . NASAL SINUS SURGERY  1990's   right    There were no vitals filed for this visit.   Subjective Assessment - 05/07/20 1350    Subjective COVID-19 screening performed upon arrival. LBP  2-3/10 today.  Having  the nerve burn again end of Sept.. Not doing well today due to diverticulitis    Pertinent History Fall risk, Pacemaker/defribillator, HTN, DM, CKD, CHF, history of MI, rhizotomy per patient report    Limitations House hold activities;Walking;Standing;Sitting    How long can you sit comfortably? unlimited    How long can you walk comfortably? 50-70 steps    Patient Stated Goals improve balance, strengthen legs    Currently in Pain? Yes    Pain Score 4     Pain Location Back    Pain Orientation Lower    Pain  Descriptors / Indicators Tightness    Pain Type Chronic pain                             [facility] Adult PT Treatment/Exercise - 05/07/20 0001      Exercises   Exercises Knee/Hip;Lumbar      Lumbar Exercises: Aerobic   Nustep L4 x 15 mins with focus on posture      Knee/Hip Exercises: Standing   Rocker Board 3 minutes   DF/ PF and calf stretch     Knee/Hip Exercises: Seated   Long Arc Quad Both;10 reps;3 sets    [employer] Weight 3 lbs.    Ball Squeeze 2x10 hold 5 secs    Clamshell with TheraBand Red   2x10 hold 5 secs   Hamstring Curl Both;15 reps;1 set   Red tband                   PT Short Term Goals - 04/16/20 2143      PT SHORT TERM GOAL #1   Title STG=LTG             PT Long Term Goals - 04/16/20 2143      PT LONG TERM GOAL #1   Title Patient will be independent with HEP    Time 6    Period Weeks    Status New      PT LONG TERM GOAL #2   Title Patient will decrease risk of falls as noted by the ability to perform modified 5x sit to stand with UE support in 17 seconds or less.    Time 6    Period Weeks    Status New      PT LONG TERM GOAL #3   Title Patient will demonstrate 4/5 or greater bilateral LE MMT to improve stability during functional tasks.    Time 4    Period Weeks    Status New                 Plan - 05/07/20 1426    Clinical Impression Statement Pt arrived today not feeling well due to diverticulitis, but was able to complete therex and act.'s in sitting and standing. Unable to progress today due to lack of energy today. Progress next session if able.    Personal Factors and Comorbidities Comorbidity 3+;Age;Time since onset of injury/illness/exacerbation    Comorbidities Fall risk, Pacemaker/defribillator, HTN, DM, CKD, CHF, history of MI    Examination-Activity Limitations Bathing;Stand;Stairs;Locomotion Level;Transfers    Stability/Clinical Decision Making Evolving/Moderate complexity    Rehab  Potential Fair    PT Frequency 2x / week    PT Duration 6 weeks    PT Treatment/Interventions ADLs/Self Care Home Management;Cryotherapy;Ultrasound;Gait training;Stair training;Functional mobility training;Therapeutic activities;Therapeutic exercise;Balance training;Neuromuscular re-education;Manual techniques;Passive range of motion;Patient/family education    PT Next Visit Plan avoid  supine (can lay with head of bed elevated), nustep, LE strengthening, attempt Berg Balance Scale, balance activities    PT Home Exercise Plan see patient education section    Consulted and Agree with Plan of Care Patient           Patient will benefit from skilled therapeutic intervention in order to improve the following deficits and impairments:  Decreased strength, Decreased balance, Decreased activity tolerance, Abnormal gait, Difficulty walking, Pain, Improper body mechanics, Postural dysfunction  Visit Diagnosis: Chronic bilateral low back pain, unspecified whether sciatica present  Unsteadiness on feet  Muscle weakness (generalized)     Problem List Patient Active Problem List   Diagnosis Date Noted  . Bloating 02/27/2020  . Nausea with vomiting 02/27/2020  . Abdominal pain 02/27/2020  . Iron deficiency anemia 02/08/2018  . Stage III chronic kidney disease 02/08/2018  . Diabetes mellitus (HCC) 02/08/2018  . 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  . Bacteremia due to Klebsiella pneumoniae 09/22/2017  . Sepsis due to Klebsiella pneumoniae (HCC) 09/22/2017  . Cholangitis 09/17/2017  . Leukocytosis 09/17/2017  . Cholelithiasis 09/17/2017  . Elevated liver enzymes 09/17/2017  . Acute renal failure superimposed on stage 3 chronic kidney disease (HCC) 09/17/2017  . Low blood pressure reading 09/17/2017  . Lactic acidosis 09/17/2017  . Elevated troponin 09/17/2017  . Nausea without vomiting 01/10/2017  . Gastrointestinal hemorrhage associated with intestinal diverticulosis 06/23/2016  .  Carotid artery disease (HCC)   . Syncope and collapse 07/11/2015  . Ventricular arrhythmia 07/10/2015  . Complete heart block (HCC)   . ICD (implantable cardioverter-defibrillator) discharge   . Atrial fibrillation (HCC) 06/04/2015  . Diabetes (HCC) 06/02/2015  . 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/02/2015  . GERD (gastroesophageal reflux disease) 02/18/2015  . Abdominal pain, epigastric 02/18/2015  . Pleural effusion 09/01/2014  . Rectal bleeding 03/25/2014  . Acute blood loss anemia 03/25/2014  . Adult body mass index 28.0-28.9 09/30/2013  . Diabetes mellitus, type 2 (HCC) 08/09/2013  . Type 2 diabetes mellitus without complication (HCC) 08/09/2013  . PVC's (premature ventricular contractions) 07/04/2013  . Hyperlipidemia 01/03/2013  . Automatic implantable cardioverter-defibrillator in situ 03/30/2012  . S/P MVR (mitral valve repair) 01/16/2012  . HTN (hypertension) 11/28/2011  . Chronic systolic heart failure (HCC) 10/31/2011  . Ischemic cardiomyopathy 10/31/2011  . CKD (chronic kidney disease) stage 3, GFR 30-59 ml/min (HCC) 10/31/2011  . Current use of long term anticoagulation 10/31/2011  . Long term (current) use of anticoagulants 10/28/2011  . S/P mitral valve repair 10/17/2011  . S/P CABG x 3 10/17/2011  . Mitral regurgitation 10/12/2011  . NSTEMI (non-ST elevated myocardial infarction) (HCC) 10/11/2011    Masie Bermingham,CHRIS, PTA 05/07/2020, 2:52 PM  [facility] [address] [place], [place], [address] Phone: [phone]   Fax:  [phone]  Name: Wayne Gordon MRN: [id number] Date of Birth: 02-May-1934

## 2020-05-08 ENCOUNTER — Ambulatory Visit: Payer: Medicare HMO | Admitting: *Deleted

## 2020-05-08 ENCOUNTER — Other Ambulatory Visit: Payer: Self-pay

## 2020-05-08 ENCOUNTER — Encounter: Payer: Self-pay | Admitting: *Deleted

## 2020-05-08 DIAGNOSIS — M545 Low back pain, unspecified: Secondary | ICD-10-CM

## 2020-05-08 DIAGNOSIS — M6281 Muscle weakness (generalized): Secondary | ICD-10-CM

## 2020-05-08 DIAGNOSIS — R2681 Unsteadiness on feet: Secondary | ICD-10-CM

## 2020-05-08 NOTE — Therapy (Signed)
[facility] [address] [place], [place], [address] Phone: [phone]   Fax:  [phone]  Physical Therapy Treatment  Patient Details  Name: Wayne Gordon MRN: [id number] Date of Birth: January 31, 1934 Referring Provider (PT): Christena Flake, NP   Encounter Date: 05/08/2020   PT End of Session - 05/08/20 0914    Visit Number 6    Number of Visits 12    Date for PT Re-Evaluation 06/04/20    Authorization Type [employer]; Progress note every 10th visit    PT Start Time 0900    PT Stop Time 0948    PT Time Calculation (min) 48 min           Past Medical History:  Diagnosis Date  . Allergy to ACE inhibitors    Angioedema many years ago; patient has tolerated Losartan (ARB) in the past - it was held during [facility] for GI bleed and worsening renal function >> resume Losartan 25 mg QD 06/2015  . Arthritis   . Atrial fibrillation (HCC)    PAF, CHADs2Vasc = 5  . Carotid artery disease (HCC)    60-79% LICA  . CHF (congestive heart failure) (HCC)   . CKD (chronic kidney disease)   . Coronary artery disease   . Diabetes mellitus   . GERD (gastroesophageal reflux disease)   . Hyperlipidemia   . Hypertension   . ICD (implantable cardiac defibrillator) in place 03/28/2012   [place]ron[place], Dr. Ladona Ridgel 03/28/12  . Ischemic cardiomyopathy    S/P CABG x 3; EF 25%  . Mitral regurgitation    S/P mitral valve repair 2013  . Myocardial infarction (HCC)   . Renal insufficiency     Past Surgical History:  Procedure Laterality Date  . CHOLECYSTECTOMY N/A 09/20/2017   Procedure: LAPAROSCOPIC SUBTOTAL CHOLECYSTECTOMY;  Surgeon: Axel Filler, MD;  Location: [facility] OR;  Service: General;  Laterality: N/A;  . COLONOSCOPY N/A 04/24/2014   RMR Pancolonic diverticulosis  . CORONARY ARTERY BYPASS GRAFT  10/17/2011   Procedure: CORONARY ARTERY BYPASS GRAFTING (CABG);  Surgeon: Purcell Nails, MD;  Location: [facility] OR;  Service: Open Heart Surgery;  Laterality:  N/A;  . ERCP N/A 09/29/2017   Procedure: ENDOSCOPIC RETROGRADE CHOLANGIOPANCREATOGRAPHY (ERCP);  Surgeon: Jeani Hawking, MD;  Location: [facility] ENDOSCOPY;  Service: Endoscopy;  Laterality: N/A;  . ESOPHAGOGASTRODUODENOSCOPY N/A 04/24/2014   RMR Subtle nodularity the gastric mucosa of uncertain significance-status post biopsy. Hiatal hernia. chronic inflammation, no H.pylori  . ESOPHAGOGASTRODUODENOSCOPY N/A 06/24/2016   Dr. Jena Gauss: normal esophagus, small hiatal hernia, normal duodenum  . ICD  03/28/2012  . IMPLANTABLE CARDIOVERTER DEFIBRILLATOR IMPLANT N/A 03/28/2012   Procedure: IMPLANTABLE CARDIOVERTER DEFIBRILLATOR IMPLANT;  Surgeon: Marinus Maw, MD;  Location: [facility] CATH LAB;  Service: Cardiovascular;  Laterality: N/A;  . KNEE ARTHROSCOPY  ~ 2008   right  . LEFT AND RIGHT HEART CATHETERIZATION WITH CORONARY ANGIOGRAM N/A 10/12/2011   Procedure: LEFT AND RIGHT HEART CATHETERIZATION WITH CORONARY ANGIOGRAM;  Surgeon: Iran Ouch, MD;  Location: [facility] CATH LAB;  Service: Cardiovascular;  Laterality: N/A;  . MITRAL VALVE REPAIR  10/17/2011   Procedure: MITRAL VALVE REPAIR (MVR);  Surgeon: Purcell Nails, MD;  Location: [facility] OR;  Service: Open Heart Surgery;  Laterality: N/A;  . NASAL SINUS SURGERY  1990's   right    There were no vitals filed for this visit.   Subjective Assessment - 05/08/20 0915    Subjective COVID-19 screening performed upon arrival. LBP  2-3/10 today.  Still  Not doing well  due to diverticulitis flare up    Pertinent History Fall risk, Pacemaker/defribillator, HTN, DM, CKD, CHF, history of MI, rhizotomy per patient report    Limitations House hold activities;Walking;Standing;Sitting    How long can you sit comfortably? unlimited    How long can you stand comfortably? one minute    How long can you walk comfortably? 50-70 steps    Patient Stated Goals improve balance, strengthen legs    Currently in Pain? Yes    Pain Score 3     Pain Location Back    Pain Orientation Lower     Pain Descriptors / Indicators Tightness    Pain Type Chronic pain                             [facility] Adult PT Treatment/Exercise - 05/08/20 0001      Exercises   Exercises Knee/Hip;Lumbar      Lumbar Exercises: Aerobic   Nustep L4 x 15 mins with focus on posture      Knee/Hip Exercises: Standing   Rocker Board 2 minutes   DF/ PF and calf stretch     Knee/Hip Exercises: Seated   Long Arc Quad Both;10 reps;3 sets    [employer] Weight 3 lbs.    Ball Squeeze 2x10 hold 5 secs    Clamshell with TheraBand Red   2x10 hold 5 secs   Hamstring Curl Both;15 reps;1 set   Red tband                   PT Short Term Goals - 04/16/20 2143      PT SHORT TERM GOAL #1   Title STG=LTG             PT Long Term Goals - 05/08/20 0941      PT LONG TERM GOAL #1   Title Patient will be independent with HEP    Time 6    Period Weeks    Status On-going      PT LONG TERM GOAL #2   Title Patient will decrease risk of falls as noted by the ability to perform modified 5x sit to stand with UE support in 17 seconds or less.    Time 6    Period Weeks    Status On-going      PT LONG TERM GOAL #3   Title Patient will demonstrate 4/5 or greater bilateral LE MMT to improve stability during functional tasks.    Time 4    Period Weeks    Status On-going                 Plan - 05/08/20 0914    Clinical Impression Statement Pt arrived today doing about the same with low energy levels due to diverticulitis. He was guided through mainly sitting strengthening exs for LE's , biut was able to perform rocker board ankle ROM exs in standing. Progress as able next visit.    Personal Factors and Comorbidities Comorbidity 3+;Age;Time since onset of injury/illness/exacerbation    Comorbidities Fall risk, Pacemaker/defribillator, HTN, DM, CKD, CHF, history of MI    Examination-Activity Limitations Bathing;Stand;Stairs;Locomotion Level;Transfers    Stability/Clinical  Decision Making Evolving/Moderate complexity    Rehab Potential Fair    PT Frequency 2x / week    PT Duration 6 weeks    PT Treatment/Interventions ADLs/Self Care Home Management;Cryotherapy;Ultrasound;Gait training;Stair training;Functional mobility training;Therapeutic activities;Therapeutic exercise;Balance training;Neuromuscular re-education;Manual techniques;Passive  range of motion;Patient/family education    PT Next Visit Plan avoid supine (can lay with head of bed elevated), nustep, LE strengthening, attempt Berg Balance Scale, balance activities. Check sit to stand LTG next visit if Pt is feeling better    PT Home Exercise Plan see patient education section           Patient will benefit from skilled therapeutic intervention in order to improve the following deficits and impairments:  Decreased strength, Decreased balance, Decreased activity tolerance, Abnormal gait, Difficulty walking, Pain, Improper body mechanics, Postural dysfunction  Visit Diagnosis: Chronic bilateral low back pain, unspecified whether sciatica present  Unsteadiness on feet  Muscle weakness (generalized)     Problem List Patient Active Problem List   Diagnosis Date Noted  . Bloating 02/27/2020  . Nausea with vomiting 02/27/2020  . Abdominal pain 02/27/2020  . Iron deficiency anemia 02/08/2018  . Stage III chronic kidney disease 02/08/2018  . Diabetes mellitus (HCC) 02/08/2018  . 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  . Bacteremia due to Klebsiella pneumoniae 09/22/2017  . Sepsis due to Klebsiella pneumoniae (HCC) 09/22/2017  . Cholangitis 09/17/2017  . Leukocytosis 09/17/2017  . Cholelithiasis 09/17/2017  . Elevated liver enzymes 09/17/2017  . Acute renal failure superimposed on stage 3 chronic kidney disease (HCC) 09/17/2017  . Low blood pressure reading 09/17/2017  . Lactic acidosis 09/17/2017  . Elevated troponin 09/17/2017  . Nausea without vomiting 01/10/2017  . Gastrointestinal  hemorrhage associated with intestinal diverticulosis 06/23/2016  . Carotid artery disease (HCC)   . Syncope and collapse 07/11/2015  . Ventricular arrhythmia 07/10/2015  . Complete heart block (HCC)   . ICD (implantable cardioverter-defibrillator) discharge   . Atrial fibrillation (HCC) 06/04/2015  . Diabetes (HCC) 06/02/2015  . 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/02/2015  . GERD (gastroesophageal reflux disease) 02/18/2015  . Abdominal pain, epigastric 02/18/2015  . Pleural effusion 09/01/2014  . Rectal bleeding 03/25/2014  . Acute blood loss anemia 03/25/2014  . Adult body mass index 28.0-28.9 09/30/2013  . Diabetes mellitus, type 2 (HCC) 08/09/2013  . Type 2 diabetes mellitus without complication (HCC) 08/09/2013  . PVC's (premature ventricular contractions) 07/04/2013  . Hyperlipidemia 01/03/2013  . Automatic implantable cardioverter-defibrillator in situ 03/30/2012  . S/P MVR (mitral valve repair) 01/16/2012  . HTN (hypertension) 11/28/2011  . Chronic systolic heart failure (HCC) 10/31/2011  . Ischemic cardiomyopathy 10/31/2011  . CKD (chronic kidney disease) stage 3, GFR 30-59 ml/min (HCC) 10/31/2011  . Current use of long term anticoagulation 10/31/2011  . Long term (current) use of anticoagulants 10/28/2011  . S/P mitral valve repair 10/17/2011  . S/P CABG x 3 10/17/2011  . Mitral regurgitation 10/12/2011  . NSTEMI (non-ST elevated myocardial infarction) (HCC) 10/11/2011    Eutha Cude,CHRIS, PTA 05/08/2020, 11:31 AM  [facility] [address] [place], [place], [address] Phone: [phone]   Fax:  [phone]  Name: Wayne Gordon MRN: [id number] Date of Birth: 02-26-1934

## 2020-05-12 ENCOUNTER — Ambulatory Visit: Payer: Medicare HMO | Admitting: *Deleted

## 2020-05-12 ENCOUNTER — Other Ambulatory Visit: Payer: Self-pay

## 2020-05-12 DIAGNOSIS — M6281 Muscle weakness (generalized): Secondary | ICD-10-CM

## 2020-05-12 DIAGNOSIS — M545 Low back pain: Secondary | ICD-10-CM | POA: Diagnosis not present

## 2020-05-12 DIAGNOSIS — G8929 Other chronic pain: Secondary | ICD-10-CM

## 2020-05-12 DIAGNOSIS — R2681 Unsteadiness on feet: Secondary | ICD-10-CM

## 2020-05-12 NOTE — Therapy (Signed)
[facility] [address] [place], [place], [address] Phone: [phone]   Fax:  [phone]  Physical Therapy Treatment  Patient Details  Name: Wayne Gordon MRN: [id number] Date of Birth: [birth year] Referring Provider (PT): Christena Flake, NP   Encounter Date: 05/12/2020   PT End of Session - 05/12/20 1354    Visit Number 7    Number of Visits 12    Date for PT Re-Evaluation 06/04/20    Authorization Type [employer]; Progress note every 10th visit    PT Start Time 1345    PT Stop Time 1435    PT Time Calculation (min) 50 min           Past Medical History:  Diagnosis Date  . Allergy to ACE inhibitors    Angioedema many years ago; patient has tolerated Losartan (ARB) in the past - it was held during [facility] for GI bleed and worsening renal function >> resume Losartan 25 mg QD 06/2015  . Arthritis   . Atrial fibrillation (HCC)    PAF, CHADs2Vasc = 5  . Carotid artery disease (HCC)    60-79% LICA  . CHF (congestive heart failure) (HCC)   . CKD (chronic kidney disease)   . Coronary artery disease   . Diabetes mellitus   . GERD (gastroesophageal reflux disease)   . Hyperlipidemia   . Hypertension   . ICD (implantable cardiac defibrillator) in place 03/28/2012   B[place]ron[place], Dr. Ladona Ridgel 03/28/12  . Ischemic cardiomyopathy    S/P CABG x 3; EF 25%  . Mitral regurgitation    S/P mitral valve repair 2013  . Myocardial infarction (HCC)   . Renal insufficiency     Past Surgical History:  Procedure Laterality Date  . CHOLECYSTECTOMY N/A 09/20/2017   Procedure: LAPAROSCOPIC SUBTOTAL CHOLECYSTECTOMY;  Surgeon: Axel Filler, MD;  Location: [facility] OR;  Service: General;  Laterality: N/A;  . COLONOSCOPY N/A 04/24/2014   RMR Pancolonic diverticulosis  . CORONARY ARTERY BYPASS GRAFT  10/17/2011   Procedure: CORONARY ARTERY BYPASS GRAFTING (CABG);  Surgeon: Purcell Nails, MD;  Location: [facility] OR;  Service: Open Heart Surgery;  Laterality:  N/A;  . ERCP N/A 09/29/2017   Procedure: ENDOSCOPIC RETROGRADE CHOLANGIOPANCREATOGRAPHY (ERCP);  Surgeon: Jeani Hawking, MD;  Location: [facility] ENDOSCOPY;  Service: Endoscopy;  Laterality: N/A;  . ESOPHAGOGASTRODUODENOSCOPY N/A 04/24/2014   RMR Subtle nodularity the gastric mucosa of uncertain significance-status post biopsy. Hiatal hernia. chronic inflammation, no H.pylori  . ESOPHAGOGASTRODUODENOSCOPY N/A 06/24/2016   Dr. Jena Gauss: normal esophagus, small hiatal hernia, normal duodenum  . ICD  03/28/2012  . IMPLANTABLE CARDIOVERTER DEFIBRILLATOR IMPLANT N/A 03/28/2012   Procedure: IMPLANTABLE CARDIOVERTER DEFIBRILLATOR IMPLANT;  Surgeon: Marinus Maw, MD;  Location: [facility] CATH LAB;  Service: Cardiovascular;  Laterality: N/A;  . KNEE ARTHROSCOPY  ~ 2008   right  . LEFT AND RIGHT HEART CATHETERIZATION WITH CORONARY ANGIOGRAM N/A 10/12/2011   Procedure: LEFT AND RIGHT HEART CATHETERIZATION WITH CORONARY ANGIOGRAM;  Surgeon: Iran Ouch, MD;  Location: [facility] CATH LAB;  Service: Cardiovascular;  Laterality: N/A;  . MITRAL VALVE REPAIR  10/17/2011   Procedure: MITRAL VALVE REPAIR (MVR);  Surgeon: Purcell Nails, MD;  Location: [facility] OR;  Service: Open Heart Surgery;  Laterality: N/A;  . NASAL SINUS SURGERY  1990's   right    There were no vitals filed for this visit.   Subjective Assessment - 05/12/20 1351    Subjective COVID-19 screening performed upon arrival. LBP  2-3/10 today, but I am  feeling much better this week    Pertinent History Fall risk, Pacemaker/defribillator, HTN, DM, CKD, CHF, history of MI, rhizotomy per patient report    How long can you sit comfortably? unlimited    How long can you stand comfortably? one minute    How long can you walk comfortably? 50-70 steps    Patient Stated Goals improve balance, strengthen legs    Currently in Pain? Yes    Pain Score 3     Pain Location Back    Pain Orientation Lower    Pain Descriptors / Indicators Tightness    Pain Type Chronic pain    Pain  Onset More than a month ago                             [facility] Adult PT Treatment/Exercise - 05/12/20 0001      Lumbar Exercises: Aerobic   Nustep L4 x 15 mins with focus on posture      Knee/Hip Exercises: Standing   Rocker Board 2 minutes   DF/ PF and calf stretch     Knee/Hip Exercises: Seated   Long Arc Quad Both;10 reps;3 sets    [employer] Weight 4 lbs.    Ball Squeeze 2x10 hold 5 secs    Clamshell with TheraBand Green   2x10 hold 5 secs   Hamstring Curl Both;1 set;2 sets;10 reps   gree tband                   PT Short Term Goals - 04/16/20 2143      PT SHORT TERM GOAL #1   Title STG=LTG             PT Long Term Goals - 05/08/20 0941      PT LONG TERM GOAL #1   Title Patient will be independent with HEP    Time 6    Period Weeks    Status On-going      PT LONG TERM GOAL #2   Title Patient will decrease risk of falls as noted by the ability to perform modified 5x sit to stand with UE support in 17 seconds or less.    Time 6    Period Weeks    Status On-going      PT LONG TERM GOAL #3   Title Patient will demonstrate 4/5 or greater bilateral LE MMT to improve stability during functional tasks.    Time 4    Period Weeks    Status On-going                 Plan - 05/12/20 1636    Clinical Impression Statement Pt arrived today doing a little better with increased energy and was able to progress exs with increased resistance on most act's. He still reports not being able to stand very long due to LE weakness and LBP.    Personal Factors and Comorbidities Comorbidity 3+;Age;Time since onset of injury/illness/exacerbation    Comorbidities Fall risk, Pacemaker/defribillator, HTN, DM, CKD, CHF, history of MI    Examination-Activity Limitations Bathing;Stand;Stairs;Locomotion Level;Transfers    Rehab Potential Fair    PT Frequency 2x / week    PT Duration 6 weeks    PT Treatment/Interventions ADLs/Self Care Home  Management;Cryotherapy;Ultrasound;Gait training;Stair training;Functional mobility training;Therapeutic activities;Therapeutic exercise;Balance training;Neuromuscular re-education;Manual techniques;Passive range of motion;Patient/family education    PT Next Visit Plan avoid supine (can lay with head of bed elevated), nustep, LE  strengthening, attempt Berg Balance Scale, balance activities. Check sit to stand LTG next visit if Pt is feeling better           Patient will benefit from skilled therapeutic intervention in order to improve the following deficits and impairments:  Decreased strength, Decreased balance, Decreased activity tolerance, Abnormal gait, Difficulty walking, Pain, Improper body mechanics, Postural dysfunction  Visit Diagnosis: Chronic bilateral low back pain, unspecified whether sciatica present  Unsteadiness on feet  Muscle weakness (generalized)     Problem List Patient Active Problem List   Diagnosis Date Noted  . Bloating 02/27/2020  . Nausea with vomiting 02/27/2020  . Abdominal pain 02/27/2020  . Iron deficiency anemia 02/08/2018  . Stage III chronic kidney disease 02/08/2018  . Diabetes mellitus (HCC) 02/08/2018  . 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  . Bacteremia due to Klebsiella pneumoniae 09/22/2017  . Sepsis due to Klebsiella pneumoniae (HCC) 09/22/2017  . Cholangitis 09/17/2017  . Leukocytosis 09/17/2017  . Cholelithiasis 09/17/2017  . Elevated liver enzymes 09/17/2017  . Acute renal failure superimposed on stage 3 chronic kidney disease (HCC) 09/17/2017  . Low blood pressure reading 09/17/2017  . Lactic acidosis 09/17/2017  . Elevated troponin 09/17/2017  . Nausea without vomiting 01/10/2017  . Gastrointestinal hemorrhage associated with intestinal diverticulosis 06/23/2016  . Carotid artery disease (HCC)   . Syncope and collapse 07/11/2015  . Ventricular arrhythmia 07/10/2015  . Complete heart block (HCC)   . ICD (implantable  cardioverter-defibrillator) discharge   . Atrial fibrillation (HCC) 06/04/2015  . Diabetes (HCC) 06/02/2015  . 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/02/2015  . GERD (gastroesophageal reflux disease) 02/18/2015  . Abdominal pain, epigastric 02/18/2015  . Pleural effusion 09/01/2014  . Rectal bleeding 03/25/2014  . Acute blood loss anemia 03/25/2014  . Adult body mass index 28.0-28.9 09/30/2013  . Diabetes mellitus, type 2 (HCC) 08/09/2013  . Type 2 diabetes mellitus without complication (HCC) 08/09/2013  . PVC's (premature ventricular contractions) 07/04/2013  . Hyperlipidemia 01/03/2013  . Automatic implantable cardioverter-defibrillator in situ 03/30/2012  . S/P MVR (mitral valve repair) 01/16/2012  . HTN (hypertension) 11/28/2011  . Chronic systolic heart failure (HCC) 10/31/2011  . Ischemic cardiomyopathy 10/31/2011  . CKD (chronic kidney disease) stage 3, GFR 30-59 ml/min (HCC) 10/31/2011  . Current use of long term anticoagulation 10/31/2011  . Long term (current) use of anticoagulants 10/28/2011  . S/P mitral valve repair 10/17/2011  . S/P CABG x 3 10/17/2011  . Mitral regurgitation 10/12/2011  . NSTEMI (non-ST elevated myocardial infarction) (HCC) 10/11/2011    Doyl Bitting,CHRIS, PTA 05/12/2020, 4:42 PM  [facility] [address] [place], [place], [address] Phone: [phone]   Fax:  [phone]  Name: QUANTARIUS GENRICH MRN: [id number] Date of Birth: [birth year]

## 2020-05-14 ENCOUNTER — Other Ambulatory Visit: Payer: Self-pay

## 2020-05-14 ENCOUNTER — Ambulatory Visit: Payer: Medicare HMO | Admitting: *Deleted

## 2020-05-14 DIAGNOSIS — R2681 Unsteadiness on feet: Secondary | ICD-10-CM

## 2020-05-14 DIAGNOSIS — M545 Low back pain: Secondary | ICD-10-CM | POA: Diagnosis not present

## 2020-05-14 DIAGNOSIS — G8929 Other chronic pain: Secondary | ICD-10-CM

## 2020-05-14 DIAGNOSIS — M6281 Muscle weakness (generalized): Secondary | ICD-10-CM

## 2020-05-14 NOTE — Therapy (Signed)
[facility] [place], [place], [address] Phone: [phone]   Fax:  [phone]  Physical Therapy Treatment  Patient Details  Name: Wayne Gordon MRN: [id number] Date of Birth: 06/17/1934 Referring Provider (PT): Garlan Fillers, NP   Encounter Date: 05/14/2020   PT End of Session - 05/14/20 1406    Visit Number 8    Number of Visits 12    Date for PT Re-Evaluation 06/04/20    Authorization Type [employer]; Progress note every 10th visit    PT Start Time 1345    PT Stop Time 1434    PT Time Calculation (min) 49 min           Past Medical History:  Diagnosis Date  . Allergy to ACE inhibitors    Angioedema many years ago; patient has tolerated Losartan (ARB) in the past - it was held during [facility] for GI bleed and worsening renal function >> resume Losartan 25 mg QD 06/2015  . Arthritis   . Atrial fibrillation (HCC)    PAF, CHADs2Vasc = 5  . Carotid artery disease (HCC)    69-62% LICA  . CHF (congestive heart failure) ([place])   . CKD (chronic kidney disease)   . Coronary artery disease   . Diabetes mellitus   . GERD (gastroesophageal reflux disease)   . Hyperlipidemia   . Hypertension   . ICD (implantable cardiac defibrillator) in place 03/28/2012   [place], Dr. Lovena Le 03/28/12  . Ischemic cardiomyopathy    S/P CABG x 3; EF 25%  . Mitral regurgitation    S/P mitral valve repair 2013  . Myocardial infarction ([place])   . Renal insufficiency     Past Surgical History:  Procedure Laterality Date  . CHOLECYSTECTOMY N/A 09/20/2017   Procedure: LAPAROSCOPIC SUBTOTAL CHOLECYSTECTOMY;  Surgeon: Ralene Ok, MD;  Location: [place];  Service: General;  Laterality: N/A;  . COLONOSCOPY N/A 04/24/2014   RMR Pancolonic diverticulosis  . CORONARY ARTERY BYPASS GRAFT  10/17/2011   Procedure: CORONARY ARTERY BYPASS GRAFTING (CABG);  Surgeon: Rexene Alberts, MD;  Location: [place];  Service: Open Heart Surgery;  Laterality:  N/A;  . ERCP N/A 09/29/2017   Procedure: ENDOSCOPIC RETROGRADE CHOLANGIOPANCREATOGRAPHY (ERCP);  Surgeon: Carol Ada, MD;  Location: [place];  Service: Endoscopy;  Laterality: N/A;  . ESOPHAGOGASTRODUODENOSCOPY N/A 04/24/2014   RMR Subtle nodularity the gastric mucosa of uncertain significance-status post biopsy. Hiatal hernia. chronic inflammation, no H.pylori  . ESOPHAGOGASTRODUODENOSCOPY N/A 06/24/2016   Dr. Gala Romney: normal esophagus, small hiatal hernia, normal duodenum  . ICD  03/28/2012  . IMPLANTABLE CARDIOVERTER DEFIBRILLATOR IMPLANT N/A 03/28/2012   Procedure: IMPLANTABLE CARDIOVERTER DEFIBRILLATOR IMPLANT;  Surgeon: Evans Lance, MD;  Location: [facility] CATH LAB;  Service: Cardiovascular;  Laterality: N/A;  . KNEE ARTHROSCOPY  ~ 2008   right  . LEFT AND RIGHT HEART CATHETERIZATION WITH CORONARY ANGIOGRAM N/A 10/12/2011   Procedure: LEFT AND RIGHT HEART CATHETERIZATION WITH CORONARY ANGIOGRAM;  Surgeon: Wellington Hampshire, MD;  Location: [place] CATH LAB;  Service: Cardiovascular;  Laterality: N/A;  . MITRAL VALVE REPAIR  10/17/2011   Procedure: MITRAL VALVE REPAIR (MVR);  Surgeon: Rexene Alberts, MD;  Location: [place];  Service: Open Heart Surgery;  Laterality: N/A;  . NASAL SINUS SURGERY  1990's   right    There were no vitals filed for this visit.   Subjective Assessment - 05/14/20 1358    Subjective COVID-19 screening performed upon arrival. LBP / sciatic nerve 4/10 today  Pertinent History Fall risk, Pacemaker/defribillator, HTN, DM, CKD, CHF, history of MI, rhizotomy per patient report    Limitations House hold activities;Walking;Standing;Sitting    How long can you sit comfortably? unlimited    How long can you stand comfortably? one minute    How long can you walk comfortably? 50-70 steps    Patient Stated Goals improve balance, strengthen legs    Currently in Pain? Yes    Pain Score 4     Pain Location Back                             [facility] Adult PT  Treatment/Exercise - 05/14/20 0001      Lumbar Exercises: Aerobic   Nustep L4 x 15 mins with focus on posture      Knee/Hip Exercises: Standing   Rocker Board 2 minutes   DF/ PF and calf stretch     Knee/Hip Exercises: Seated   Long Arc Quad Both;10 reps;3 sets    [employer] Weight 4 lbs.    Ball Squeeze 2x10 hold 5 secs    Clamshell with TheraBand Green   3x10 hold 5 secs   Hamstring Curl Both;2 sets;10 reps   green tband   Sit to Sand 5 reps;with UE support   35,33 secs                   PT Short Term Goals - 04/16/20 2143      PT SHORT TERM GOAL #1   Title STG=LTG             PT Long Term Goals - 05/08/20 0941      PT LONG TERM GOAL #1   Title Patient will be independent with HEP    Time 6    Period Weeks    Status On-going      PT LONG TERM GOAL #2   Title Patient will decrease risk of falls as noted by the ability to perform modified 5x sit to stand with UE support in 17 seconds or less.    Time 6    Period Weeks    Status On-going      PT LONG TERM GOAL #3   Title Patient will demonstrate 4/5 or greater bilateral LE MMT to improve stability during functional tasks.    Time 4    Period Weeks    Status On-going                 Plan - 05/14/20 1441    Clinical Impression Statement Pt arrived today doing fairly well with 3-4/10 LBP and RT side sciatica pain. He was guided through LE / core strengthening exs with added sit to stand. No new LTGs met today due to weakness and pain.    Personal Factors and Comorbidities Comorbidity 3+;Age;Time since onset of injury/illness/exacerbation    Comorbidities Fall risk, Pacemaker/defribillator, HTN, DM, CKD, CHF, history of MI    Examination-Activity Limitations Bathing;Stand;Stairs;Locomotion Level;Transfers    Stability/Clinical Decision Making Evolving/Moderate complexity    Rehab Potential Fair    PT Frequency 2x / week    PT Duration 6 weeks    PT Treatment/Interventions ADLs/Self Care Home  Management;Cryotherapy;Ultrasound;Gait training;Stair training;Functional mobility training;Therapeutic activities;Therapeutic exercise;Balance training;Neuromuscular re-education;Manual techniques;Passive range of motion;Patient/family education    PT Next Visit Plan avoid supine (can lay with head of bed elevated), nustep, LE strengthening, attempt Berg Balance Scale, balance activities.    PT Home  Exercise Plan see patient education section    Consulted and Agree with Plan of Care Patient           Patient will benefit from skilled therapeutic intervention in order to improve the following deficits and impairments:  Decreased strength, Decreased balance, Decreased activity tolerance, Abnormal gait, Difficulty walking, Pain, Improper body mechanics, Postural dysfunction  Visit Diagnosis: Chronic bilateral low back pain, unspecified whether sciatica present  Unsteadiness on feet  Muscle weakness (generalized)     Problem List Patient Active Problem List   Diagnosis Date Noted  . Bloating 02/27/2020  . Nausea with vomiting 02/27/2020  . Abdominal pain 02/27/2020  . Iron deficiency anemia 02/08/2018  . Stage III chronic kidney disease 02/08/2018  . Diabetes mellitus ([place]) 02/08/2018  . 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  . Bacteremia due to Klebsiella pneumoniae 09/22/2017  . Sepsis due to Klebsiella pneumoniae ([place]) 09/22/2017  . Cholangitis 09/17/2017  . Leukocytosis 09/17/2017  . Cholelithiasis 09/17/2017  . Elevated liver enzymes 09/17/2017  . Acute renal failure superimposed on stage 3 chronic kidney disease ([place]) 09/17/2017  . Low blood pressure reading 09/17/2017  . Lactic acidosis 09/17/2017  . Elevated troponin 09/17/2017  . Nausea without vomiting 01/10/2017  . Gastrointestinal hemorrhage associated with intestinal diverticulosis 06/23/2016  . Carotid artery disease ([place])   . Syncope and collapse 07/11/2015  . Ventricular arrhythmia 07/10/2015  . Complete  heart block ([place])   . ICD (implantable cardioverter-defibrillator) discharge   . Atrial fibrillation ([place]) 06/04/2015  . Diabetes ([place]) 06/02/2015  . Gonadotropin deficiency ([place]) 06/02/2015  . GERD (gastroesophageal reflux disease) 02/18/2015  . Abdominal pain, epigastric 02/18/2015  . Pleural effusion 09/01/2014  . Rectal bleeding 03/25/2014  . Acute blood loss anemia 03/25/2014  . Adult body mass index 28.0-28.9 09/30/2013  . Diabetes mellitus, type 2 ([place]) 08/09/2013  . Type 2 diabetes mellitus without complication ([place]) 42/87/6811  . PVC's (premature ventricular contractions) 07/04/2013  . Hyperlipidemia 01/03/2013  . Automatic implantable cardioverter-defibrillator in situ 03/30/2012  . S/P MVR (mitral valve repair) 01/16/2012  . HTN (hypertension) 11/28/2011  . Chronic systolic heart failure ([place]) 10/31/2011  . Ischemic cardiomyopathy 10/31/2011  . CKD (chronic kidney disease) stage 3, GFR 30-59 ml/min (HCC) 10/31/2011  . Current use of long term anticoagulation 10/31/2011  . Long term (current) use of anticoagulants 10/28/2011  . S/P mitral valve repair 10/17/2011  . S/P CABG x 3 10/17/2011  . Mitral regurgitation 10/12/2011  . NSTEMI (non-ST elevated myocardial infarction) ([place]) 10/11/2011    Sandip Power,CHRIS, PTA 05/14/2020, 2:45 PM  [facility] [address] [place], [place], [address] Phone: [phone]   Fax:  [phone]  Name: Wayne Gordon MRN: [id number] Date of Birth: [birth year]

## 2020-05-19 ENCOUNTER — Encounter: Payer: Self-pay | Admitting: Physical Therapy

## 2020-05-19 ENCOUNTER — Ambulatory Visit: Payer: Medicare HMO | Admitting: Physical Therapy

## 2020-05-19 ENCOUNTER — Other Ambulatory Visit: Payer: Self-pay

## 2020-05-19 DIAGNOSIS — M545 Low back pain, unspecified: Secondary | ICD-10-CM

## 2020-05-19 DIAGNOSIS — R2681 Unsteadiness on feet: Secondary | ICD-10-CM

## 2020-05-19 DIAGNOSIS — M6281 Muscle weakness (generalized): Secondary | ICD-10-CM

## 2020-05-19 NOTE — Therapy (Signed)
[facility] [address] [place], [place], [address] Phone: [phone]   Fax:  [phone]  Physical Therapy Treatment  Patient Details  Name: Wayne Gordon MRN: [id number] Date of Birth: 07-20-1934 Referring Provider (PT): Christena Flake, NP   Encounter Date: 05/19/2020   PT End of Session - 05/19/20 1350    Visit Number 9    Number of Visits 12    Date for PT Re-Evaluation 06/04/20    Authorization Type [employer]; Progress note every 10th visit    Authorization Time Period 04/16/2020-06/05/2020    PT Start Time 1347    PT Stop Time 1436    PT Time Calculation (min) 49 min    Activity Tolerance Patient tolerated treatment well    Behavior During Therapy [facility] for tasks assessed/performed           Past Medical History:  Diagnosis Date  . Allergy to ACE inhibitors    Angioedema many years ago; patient has tolerated Losartan (ARB) in the past - it was held during [facility] for GI bleed and worsening renal function >> resume Losartan 25 mg QD 06/2015  . Arthritis   . Atrial fibrillation (HCC)    PAF, CHADs2Vasc = 5  . Carotid artery disease (HCC)    60-79% LICA  . CHF (congestive heart failure) (HCC)   . CKD (chronic kidney disease)   . Coronary artery disease   . Diabetes mellitus   . GERD (gastroesophageal reflux disease)   . Hyperlipidemia   . Hypertension   . ICD (implantable cardiac defibrillator) in place 03/28/2012   [place], Dr. Ladona Ridgel 03/28/12  . Ischemic cardiomyopathy    S/P CABG x 3; EF 25%  . Mitral regurgitation    S/P mitral valve repair 2013  . Myocardial infarction (HCC)   . Renal insufficiency     Past Surgical History:  Procedure Laterality Date  . CHOLECYSTECTOMY N/A 09/20/2017   Procedure: LAPAROSCOPIC SUBTOTAL CHOLECYSTECTOMY;  Surgeon: Axel Filler, MD;  Location: [facility] OR;  Service: General;  Laterality: N/A;  . COLONOSCOPY N/A 04/24/2014   RMR Pancolonic diverticulosis  . CORONARY ARTERY  BYPASS GRAFT  10/17/2011   Procedure: CORONARY ARTERY BYPASS GRAFTING (CABG);  Surgeon: Purcell Nails, MD;  Location: [facility] OR;  Service: Open Heart Surgery;  Laterality: N/A;  . ERCP N/A 09/29/2017   Procedure: ENDOSCOPIC RETROGRADE CHOLANGIOPANCREATOGRAPHY (ERCP);  Surgeon: Jeani Hawking, MD;  Location: [facility] ENDOSCOPY;  Service: Endoscopy;  Laterality: N/A;  . ESOPHAGOGASTRODUODENOSCOPY N/A 04/24/2014   RMR Subtle nodularity the gastric mucosa of uncertain significance-status post biopsy. Hiatal hernia. chronic inflammation, no H.pylori  . ESOPHAGOGASTRODUODENOSCOPY N/A 06/24/2016   Dr. Jena Gauss: normal esophagus, small hiatal hernia, normal duodenum  . ICD  03/28/2012  . IMPLANTABLE CARDIOVERTER DEFIBRILLATOR IMPLANT N/A 03/28/2012   Procedure: IMPLANTABLE CARDIOVERTER DEFIBRILLATOR IMPLANT;  Surgeon: Marinus Maw, MD;  Location: [facility] CATH LAB;  Service: Cardiovascular;  Laterality: N/A;  . KNEE ARTHROSCOPY  ~ 2008   right  . LEFT AND RIGHT HEART CATHETERIZATION WITH CORONARY ANGIOGRAM N/A 10/12/2011   Procedure: LEFT AND RIGHT HEART CATHETERIZATION WITH CORONARY ANGIOGRAM;  Surgeon: Iran Ouch, MD;  Location: [facility] CATH LAB;  Service: Cardiovascular;  Laterality: N/A;  . MITRAL VALVE REPAIR  10/17/2011   Procedure: MITRAL VALVE REPAIR (MVR);  Surgeon: Purcell Nails, MD;  Location: [facility] OR;  Service: Open Heart Surgery;  Laterality: N/A;  . NASAL SINUS SURGERY  1990's   right    There were no vitals filed  for this visit.   Subjective Assessment - 05/19/20 1349    Subjective COVID-19 screening performed upon arrival. Patient reports doing better.    Pertinent History Fall risk, Pacemaker/defribillator, HTN, DM, CKD, CHF, history of MI, rhizotomy per patient report    Limitations House hold activities;Walking;Standing;Sitting    How long can you sit comfortably? unlimited    How long can you stand comfortably? one minute    How long can you walk comfortably? 50-70 steps    Patient Stated Goals  improve balance, strengthen legs    Currently in Pain? Yes   did not provide number on pain scale             [facility] PT Assessment - 05/19/20 0001      Assessment   Medical Diagnosis lumbar degenerative disc disease, other spondylosis with radiculopathy, lumbar region, spinal stenosis of lumbar region with neurogenic claudication, bilateral leg weakness, frequent falls    Referring Provider (PT) Christena Flake, NP    Next MD Visit End of September    Prior Therapy no      Precautions   Precautions Fall      Restrictions   Weight Bearing Restrictions No                         [facility] Adult PT Treatment/Exercise - 05/19/20 0001      Lumbar Exercises: Aerobic   Nustep L4 x 15 mins with focus on posture      Lumbar Exercises: Seated   Other Seated Lumbar Exercises core crunch with red physioball x20      Knee/Hip Exercises: Standing   Rocker Board 2 minutes   calf stretchin   Rocker Board Limitations g      Knee/Hip Exercises: Seated   Long Arc Quad Both;10 reps;3 sets    [employer] Weight 4 lbs.    Clamshell with TheraBand Green   2x10 5" hold   Marching Strengthening;Both;2 sets;10 reps    Marching Weights 4 lbs.    Hamstring Curl Strengthening;Both;3 sets;10 reps   5" hold green theraband                   PT Short Term Goals - 04/16/20 2143      PT SHORT TERM GOAL #1   Title STG=LTG             PT Long Term Goals - 05/08/20 0941      PT LONG TERM GOAL #1   Title Patient will be independent with HEP    Time 6    Period Weeks    Status On-going      PT LONG TERM GOAL #2   Title Patient will decrease risk of falls as noted by the ability to perform modified 5x sit to stand with UE support in 17 seconds or less.    Time 6    Period Weeks    Status On-going      PT LONG TERM GOAL #3   Title Patient will demonstrate 4/5 or greater bilateral LE MMT to improve stability during functional tasks.    Time 4    Period Weeks     Status On-going                 Plan - 05/19/20 1439    Clinical Impression Statement Patient responded fairly well to therapy session with minimal reports of increased pain. Patient demonstrated excellent form and technique with all  seated exercises. Patient was able to tolerate the addition of new TEs with minimal complaints of low back pain. Patient and PT discussed assessing goals next visit. Patient reported understanding.    Personal Factors and Comorbidities Comorbidity 3+;Age;Time since onset of injury/illness/exacerbation    Comorbidities Fall risk, Pacemaker/defribillator, HTN, DM, CKD, CHF, history of MI    Examination-Activity Limitations Bathing;Stand;Stairs;Locomotion Level;Transfers    Stability/Clinical Decision Making Evolving/Moderate complexity    Clinical Decision Making Moderate    Rehab Potential Fair    PT Frequency 2x / week    PT Duration 6 weeks    PT Treatment/Interventions ADLs/Self Care Home Management;Cryotherapy;Ultrasound;Gait training;Stair training;Functional mobility training;Therapeutic activities;Therapeutic exercise;Balance training;Neuromuscular re-education;Manual techniques;Passive range of motion;Patient/family education    PT Next Visit Plan avoid supine (can lay with head of bed elevated), nustep, LE strengthening, attempt Berg Balance Scale, balance activities.    PT Home Exercise Plan see patient education section    Consulted and Agree with Plan of Care Patient           Patient will benefit from skilled therapeutic intervention in order to improve the following deficits and impairments:  Decreased strength, Decreased balance, Decreased activity tolerance, Abnormal gait, Difficulty walking, Pain, Improper body mechanics, Postural dysfunction  Visit Diagnosis: Chronic bilateral low back pain, unspecified whether sciatica present  Unsteadiness on feet  Muscle weakness (generalized)     Problem List Patient Active Problem List    Diagnosis Date Noted  . Bloating 02/27/2020  . Nausea with vomiting 02/27/2020  . Abdominal pain 02/27/2020  . Iron deficiency anemia 02/08/2018  . Stage III chronic kidney disease 02/08/2018  . Diabetes mellitus (HCC) 02/08/2018  . 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  . Bacteremia due to Klebsiella pneumoniae 09/22/2017  . Sepsis due to Klebsiella pneumoniae (HCC) 09/22/2017  . Cholangitis 09/17/2017  . Leukocytosis 09/17/2017  . Cholelithiasis 09/17/2017  . Elevated liver enzymes 09/17/2017  . Acute renal failure superimposed on stage 3 chronic kidney disease (HCC) 09/17/2017  . Low blood pressure reading 09/17/2017  . Lactic acidosis 09/17/2017  . Elevated troponin 09/17/2017  . Nausea without vomiting 01/10/2017  . Gastrointestinal hemorrhage associated with intestinal diverticulosis 06/23/2016  . Carotid artery disease (HCC)   . Syncope and collapse 07/11/2015  . Ventricular arrhythmia 07/10/2015  . Complete heart block (HCC)   . ICD (implantable cardioverter-defibrillator) discharge   . Atrial fibrillation (HCC) 06/04/2015  . Diabetes (HCC) 06/02/2015  . 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/02/2015  . GERD (gastroesophageal reflux disease) 02/18/2015  . Abdominal pain, epigastric 02/18/2015  . Pleural effusion 09/01/2014  . Rectal bleeding 03/25/2014  . Acute blood loss anemia 03/25/2014  . Adult body mass index 28.0-28.9 09/30/2013  . Diabetes mellitus, type 2 (HCC) 08/09/2013  . Type 2 diabetes mellitus without complication (HCC) 08/09/2013  . PVC's (premature ventricular contractions) 07/04/2013  . Hyperlipidemia 01/03/2013  . Automatic implantable cardioverter-defibrillator in situ 03/30/2012  . S/P MVR (mitral valve repair) 01/16/2012  . HTN (hypertension) 11/28/2011  . Chronic systolic heart failure (HCC) 10/31/2011  . Ischemic cardiomyopathy 10/31/2011  . CKD (chronic kidney disease) stage 3, GFR 30-59 ml/min (HCC) 10/31/2011  . Current use of long term  anticoagulation 10/31/2011  . Long term (current) use of anticoagulants 10/28/2011  . S/P mitral valve repair 10/17/2011  . S/P CABG x 3 10/17/2011  . Mitral regurgitation 10/12/2011  . NSTEMI (non-ST elevated myocardial infarction) (HCC) 10/11/2011    Guss Bunde, PT, DPT 05/19/2020, 2:55 PM  [facility] [address]  [address] [place], [place], [address] Phone: [phone]   Fax:  [phone]  Name: Wayne Gordon MRN: [id number] Date of Birth: 1934/06/19

## 2020-05-21 ENCOUNTER — Ambulatory Visit: Payer: Medicare HMO | Admitting: Physical Therapy

## 2020-05-25 ENCOUNTER — Ambulatory Visit: Payer: Medicare HMO | Admitting: Physical Therapy

## 2020-05-25 ENCOUNTER — Encounter: Payer: Self-pay | Admitting: Physical Therapy

## 2020-05-25 DIAGNOSIS — G8929 Other chronic pain: Secondary | ICD-10-CM

## 2020-05-25 DIAGNOSIS — M6281 Muscle weakness (generalized): Secondary | ICD-10-CM

## 2020-05-25 DIAGNOSIS — R2681 Unsteadiness on feet: Secondary | ICD-10-CM

## 2020-05-25 DIAGNOSIS — M545 Low back pain: Secondary | ICD-10-CM | POA: Diagnosis not present

## 2020-05-25 NOTE — Therapy (Signed)
[facility] [place], [place], [address] Phone: [phone]   Fax:  [phone]  Physical Therapy Treatment   Progress Note Reporting Period 04/26/2020 to 06/04/2020  See note below for Objective Data and Assessment of Progress/Goals. Patient has made some progress with function at home. Goals partially met. Patient will be DC next visit.   Patient Details  Name: Wayne Gordon MRN: [id number] Date of Birth: March 10, 1934 Referring Provider (PT): Garlan Fillers, NP   Encounter Date: 05/25/2020   PT End of Session - 05/25/20 1128    Visit Number 10    Number of Visits 12    Date for PT Re-Evaluation 06/04/20    Authorization Type [employer]; Progress note every 10th visit    Authorization Time Period 04/16/2020-06/05/2020    PT Start Time 1115    PT Stop Time 1200    PT Time Calculation (min) 45 min    Activity Tolerance Patient tolerated treatment well    Behavior During Therapy [facility] for tasks assessed/performed           Past Medical History:  Diagnosis Date  . Allergy to ACE inhibitors    Angioedema many years ago; patient has tolerated Losartan (ARB) in the past - it was held during [facility] for GI bleed and worsening renal function >> resume Losartan 25 mg QD 06/2015  . Arthritis   . Atrial fibrillation (HCC)    PAF, CHADs2Vasc = 5  . Carotid artery disease (HCC)    49-67% LICA  . CHF (congestive heart failure) ([place])   . CKD (chronic kidney disease)   . Coronary artery disease   . Diabetes mellitus   . GERD (gastroesophageal reflux disease)   . Hyperlipidemia   . Hypertension   . ICD (implantable cardiac defibrillator) in place 03/28/2012   [place], Dr. Lovena Le 03/28/12  . Ischemic cardiomyopathy    S/P CABG x 3; EF 25%  . Mitral regurgitation    S/P mitral valve repair 2013  . Myocardial infarction ([place])   . Renal insufficiency     Past Surgical History:  Procedure Laterality Date  . CHOLECYSTECTOMY  N/A 09/20/2017   Procedure: LAPAROSCOPIC SUBTOTAL CHOLECYSTECTOMY;  Surgeon: Ralene Ok, MD;  Location: [place];  Service: General;  Laterality: N/A;  . COLONOSCOPY N/A 04/24/2014   RMR Pancolonic diverticulosis  . CORONARY ARTERY BYPASS GRAFT  10/17/2011   Procedure: CORONARY ARTERY BYPASS GRAFTING (CABG);  Surgeon: Rexene Alberts, MD;  Location: [place];  Service: Open Heart Surgery;  Laterality: N/A;  . ERCP N/A 09/29/2017   Procedure: ENDOSCOPIC RETROGRADE CHOLANGIOPANCREATOGRAPHY (ERCP);  Surgeon: Carol Ada, MD;  Location: [place];  Service: Endoscopy;  Laterality: N/A;  . ESOPHAGOGASTRODUODENOSCOPY N/A 04/24/2014   RMR Subtle nodularity the gastric mucosa of uncertain significance-status post biopsy. Hiatal hernia. chronic inflammation, no H.pylori  . ESOPHAGOGASTRODUODENOSCOPY N/A 06/24/2016   Dr. Gala Romney: normal esophagus, small hiatal hernia, normal duodenum  . ICD  03/28/2012  . IMPLANTABLE CARDIOVERTER DEFIBRILLATOR IMPLANT N/A 03/28/2012   Procedure: IMPLANTABLE CARDIOVERTER DEFIBRILLATOR IMPLANT;  Surgeon: Evans Lance, MD;  Location: [facility] CATH LAB;  Service: Cardiovascular;  Laterality: N/A;  . KNEE ARTHROSCOPY  ~ 2008   right  . LEFT AND RIGHT HEART CATHETERIZATION WITH CORONARY ANGIOGRAM N/A 10/12/2011   Procedure: LEFT AND RIGHT HEART CATHETERIZATION WITH CORONARY ANGIOGRAM;  Surgeon: Wellington Hampshire, MD;  Location: [place] CATH LAB;  Service: Cardiovascular;  Laterality: N/A;  . MITRAL VALVE REPAIR  10/17/2011   Procedure: MITRAL  VALVE REPAIR (MVR);  Surgeon: Rexene Alberts, MD;  Location: [place];  Service: Open Heart Surgery;  Laterality: N/A;  . NASAL SINUS SURGERY  1990's   right    There were no vitals filed for this visit.   Subjective Assessment - 05/25/20 1126    Subjective COVID-19 screening performed upon arrival. Patient reports doing fairly well.    Pertinent History Fall risk, Pacemaker/defribillator, HTN, DM, CKD, CHF, history of MI, rhizotomy per patient  report    Limitations House hold activities;Walking;Standing;Sitting    How long can you sit comfortably? unlimited    How long can you stand comfortably? one minute    How long can you walk comfortably? 50-70 steps    Patient Stated Goals improve balance, strengthen legs    Currently in Pain? Yes   did not provide number on pain scale             [facility] PT Assessment - 05/25/20 0001      Assessment   Medical Diagnosis lumbar degenerative disc disease, other spondylosis with radiculopathy, lumbar region, spinal stenosis of lumbar region with neurogenic claudication, bilateral leg weakness, frequent falls    Referring Provider (PT) Garlan Fillers, NP    Next MD Visit End of September    Prior Therapy no      Precautions   Precautions Fall      Strength   Right Hip Flexion 4-/5    Right Hip ABduction 4/5    Right Hip ADduction 4/5    Left Hip Flexion 4-/5    Left Hip ABduction 4/5    Left Hip ADduction 4/5    Right Knee Flexion 4-/5    Right Knee Extension 4/5    Left Knee Flexion 4-/5    Left Knee Extension 4+/5      Transfers   Five time sit to stand comments  23.63 modified with UE support                          [facility] Adult PT Treatment/Exercise - 05/25/20 0001      Lumbar Exercises: Aerobic   Nustep L4 x 15 mins with focus on posture      Knee/Hip Exercises: Seated   Long Arc Quad Both;10 reps;3 sets    [employer] Weight 4 lbs.    Clamshell with TheraBand Green   2x10 with 3" hold   Marching Strengthening;Both;2 sets;10 reps    Marching Weights 4 lbs.    Sit to [employer] 2 sets;5 reps                    PT Short Term Goals - 04/16/20 2143      PT SHORT TERM GOAL #1   Title STG=LTG             PT Long Term Goals - 05/08/20 0941      PT LONG TERM GOAL #1   Title Patient will be independent with HEP    Time 6    Period Weeks    Status On-going      PT LONG TERM GOAL #2   Title Patient will decrease risk of falls as noted  by the ability to perform modified 5x sit to stand with UE support in 17 seconds or less.    Time 6    Period Weeks    Status On-going      PT LONG TERM GOAL #3   Title  Patient will demonstrate 4/5 or greater bilateral LE MMT to improve stability during functional tasks.    Time 4    Period Weeks    Status On-going                 Plan - 05/25/20 1200    Clinical Impression Statement Patient was able to tolerate treatment well but with reports of right knee pain with 5x sit to stand test with UE support. Patient's goals were partially met. Patient provided with green theraband and disucssed was to progress HEP for strengthening. Patient reported understanding. Patient and PT discussed DC next visit due to finances.    Personal Factors and Comorbidities Comorbidity 3+;Age;Time since onset of injury/illness/exacerbation    Comorbidities Fall risk, Pacemaker/defribillator, HTN, DM, CKD, CHF, history of MI    Examination-Activity Limitations Bathing;Stand;Stairs;Locomotion Level;Transfers    Stability/Clinical Decision Making Evolving/Moderate complexity    Clinical Decision Making Moderate    Rehab Potential Fair    PT Frequency 2x / week    PT Duration 6 weeks    PT Treatment/Interventions ADLs/Self Care Home Management;Cryotherapy;Ultrasound;Gait training;Stair training;Functional mobility training;Therapeutic activities;Therapeutic exercise;Balance training;Neuromuscular re-education;Manual techniques;Passive range of motion;Patient/family education    PT Next Visit Plan DC next visit    PT Home Exercise Plan see patient education section    Consulted and Agree with Plan of Care Patient           Patient will benefit from skilled therapeutic intervention in order to improve the following deficits and impairments:  Decreased strength, Decreased balance, Decreased activity tolerance, Abnormal gait, Difficulty walking, Pain, Improper body mechanics, Postural dysfunction  Visit  Diagnosis: Chronic bilateral low back pain, unspecified whether sciatica present  Unsteadiness on feet  Muscle weakness (generalized)     Problem List Patient Active Problem List   Diagnosis Date Noted  . Bloating 02/27/2020  . Nausea with vomiting 02/27/2020  . Abdominal pain 02/27/2020  . Iron deficiency anemia 02/08/2018  . Stage III chronic kidney disease 02/08/2018  . Diabetes mellitus ([place]) 02/08/2018  . 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  . Bacteremia due to Klebsiella pneumoniae 09/22/2017  . Sepsis due to Klebsiella pneumoniae ([place]) 09/22/2017  . Cholangitis 09/17/2017  . Leukocytosis 09/17/2017  . Cholelithiasis 09/17/2017  . Elevated liver enzymes 09/17/2017  . Acute renal failure superimposed on stage 3 chronic kidney disease ([place]) 09/17/2017  . Low blood pressure reading 09/17/2017  . Lactic acidosis 09/17/2017  . Elevated troponin 09/17/2017  . Nausea without vomiting 01/10/2017  . Gastrointestinal hemorrhage associated with intestinal diverticulosis 06/23/2016  . Carotid artery disease ([place])   . Syncope and collapse 07/11/2015  . Ventricular arrhythmia 07/10/2015  . Complete heart block ([place])   . ICD (implantable cardioverter-defibrillator) discharge   . Atrial fibrillation ([place]) 06/04/2015  . Diabetes ([place]) 06/02/2015  . Gonadotropin deficiency ([place]) 06/02/2015  . GERD (gastroesophageal reflux disease) 02/18/2015  . Abdominal pain, epigastric 02/18/2015  . Pleural effusion 09/01/2014  . Rectal bleeding 03/25/2014  . Acute blood loss anemia 03/25/2014  . Adult body mass index 28.0-28.9 09/30/2013  . Diabetes mellitus, type 2 ([place]) 08/09/2013  . Type 2 diabetes mellitus without complication ([place]) 28/36/6294  . PVC's (premature ventricular contractions) 07/04/2013  . Hyperlipidemia 01/03/2013  . Automatic implantable cardioverter-defibrillator in situ 03/30/2012  . S/P MVR (mitral valve repair) 01/16/2012  . HTN (hypertension) 11/28/2011  .  Chronic systolic heart failure ([place]) 10/31/2011  . Ischemic cardiomyopathy 10/31/2011  . CKD (chronic kidney disease) stage 3, GFR 30-59 ml/min (HCC) 10/31/2011  .  Current use of long term anticoagulation 10/31/2011  . Long term (current) use of anticoagulants 10/28/2011  . S/P mitral valve repair 10/17/2011  . S/P CABG x 3 10/17/2011  . Mitral regurgitation 10/12/2011  . NSTEMI (non-ST elevated myocardial infarction) ([place]) 10/11/2011    Gabriela Eves, PT, DPT 05/25/2020, 12:09 PM  [facility] [address] [place], [place], [address] Phone: [phone]   Fax:  [phone]  Name: KALLUM JORGENSEN MRN: [id number] Date of Birth: 1934-08-27

## 2020-05-27 ENCOUNTER — Other Ambulatory Visit: Payer: Self-pay

## 2020-05-27 ENCOUNTER — Ambulatory Visit: Payer: Medicare HMO | Admitting: Physical Therapy

## 2020-05-27 DIAGNOSIS — M545 Low back pain, unspecified: Secondary | ICD-10-CM

## 2020-05-27 DIAGNOSIS — R2681 Unsteadiness on feet: Secondary | ICD-10-CM

## 2020-05-27 DIAGNOSIS — M6281 Muscle weakness (generalized): Secondary | ICD-10-CM

## 2020-05-27 NOTE — Therapy (Signed)
[facility] [place], [place], [address] Phone: [phone]   Fax:  [phone]  Physical Therapy Treatment PHYSICAL THERAPY DISCHARGE SUMMARY  Visits from Start of Care:11  Current functional level related to goals / functional outcomes: See below   Remaining deficits: See goals   Education / Equipment: HEP  Plan: Patient agrees to discharge.  Patient goals were partially met. Patient is being discharged due to the patient's request.  ?????     Patient Details  Name: Wayne Gordon MRN: [id number] Date of Birth: [birth year] Referring Provider (PT): Garlan Fillers, NP   Encounter Date: 05/27/2020   PT End of Session - 05/27/20 1153    Visit Number 11    Number of Visits 12    Date for PT Re-Evaluation 06/04/20    Authorization Type [employer]; Progress note every 10th visit    Authorization Time Period 04/16/2020-06/05/2020    PT Start Time 1115    PT Stop Time 1156    PT Time Calculation (min) 41 min    Activity Tolerance Patient tolerated treatment well;Patient limited by pain    Behavior During Therapy [facility] for tasks assessed/performed           Past Medical History:  Diagnosis Date  . Allergy to ACE inhibitors    Angioedema many years ago; patient has tolerated Losartan (ARB) in the past - it was held during [facility] for GI bleed and worsening renal function >> resume Losartan 25 mg QD 06/2015  . Arthritis   . Atrial fibrillation (HCC)    PAF, CHADs2Vasc = 5  . Carotid artery disease (HCC)    70-62% LICA  . CHF (congestive heart failure) ([place])   . CKD (chronic kidney disease)   . Coronary artery disease   . Diabetes mellitus   . GERD (gastroesophageal reflux disease)   . Hyperlipidemia   . Hypertension   . ICD (implantable cardiac defibrillator) in place 03/28/2012   [place], Dr. Lovena Le 03/28/12  . Ischemic cardiomyopathy    S/P CABG x 3; EF 25%  . Mitral regurgitation    S/P mitral valve  repair 2013  . Myocardial infarction ([place])   . Renal insufficiency     Past Surgical History:  Procedure Laterality Date  . CHOLECYSTECTOMY N/A 09/20/2017   Procedure: LAPAROSCOPIC SUBTOTAL CHOLECYSTECTOMY;  Surgeon: Ralene Ok, MD;  Location: [place];  Service: General;  Laterality: N/A;  . COLONOSCOPY N/A 04/24/2014   RMR Pancolonic diverticulosis  . CORONARY ARTERY BYPASS GRAFT  10/17/2011   Procedure: CORONARY ARTERY BYPASS GRAFTING (CABG);  Surgeon: Rexene Alberts, MD;  Location: [place];  Service: Open Heart Surgery;  Laterality: N/A;  . ERCP N/A 09/29/2017   Procedure: ENDOSCOPIC RETROGRADE CHOLANGIOPANCREATOGRAPHY (ERCP);  Surgeon: Carol Ada, MD;  Location: [place];  Service: Endoscopy;  Laterality: N/A;  . ESOPHAGOGASTRODUODENOSCOPY N/A 04/24/2014   RMR Subtle nodularity the gastric mucosa of uncertain significance-status post biopsy. Hiatal hernia. chronic inflammation, no H.pylori  . ESOPHAGOGASTRODUODENOSCOPY N/A 06/24/2016   Dr. Gala Romney: normal esophagus, small hiatal hernia, normal duodenum  . ICD  03/28/2012  . IMPLANTABLE CARDIOVERTER DEFIBRILLATOR IMPLANT N/A 03/28/2012   Procedure: IMPLANTABLE CARDIOVERTER DEFIBRILLATOR IMPLANT;  Surgeon: Evans Lance, MD;  Location: [facility] CATH LAB;  Service: Cardiovascular;  Laterality: N/A;  . KNEE ARTHROSCOPY  ~ 2008   right  . LEFT AND RIGHT HEART CATHETERIZATION WITH CORONARY ANGIOGRAM N/A 10/12/2011   Procedure: LEFT AND RIGHT HEART CATHETERIZATION WITH CORONARY ANGIOGRAM;  Surgeon: Mertie Clause  Fletcher Anon, MD;  Location: [place] CATH LAB;  Service: Cardiovascular;  Laterality: N/A;  . MITRAL VALVE REPAIR  10/17/2011   Procedure: MITRAL VALVE REPAIR (MVR);  Surgeon: Rexene Alberts, MD;  Location: [place];  Service: Open Heart Surgery;  Laterality: N/A;  . NASAL SINUS SURGERY  1990's   right    There were no vitals filed for this visit.   Subjective Assessment - 05/27/20 1120    Subjective COVID-19 screening performed upon arrival. Patient  arrived with increased back pain for unknown reason.    Pertinent History Fall risk, Pacemaker/defribillator, HTN, DM, CKD, CHF, history of MI, rhizotomy per patient report    Limitations House hold activities;Walking;Standing;Sitting    How long can you sit comfortably? unlimited    How long can you stand comfortably? one minute    How long can you walk comfortably? 50-70 steps    Patient Stated Goals improve balance, strengthen legs    Currently in Pain? Yes    Pain Score 8     Pain Location Back    Pain Orientation Lower    Pain Descriptors / Indicators Discomfort    Pain Type Chronic pain    Pain Onset More than a month ago    Pain Frequency Constant    Aggravating Factors  walking and standing    Pain Relieving Factors at rest              [facility] PT Assessment - 05/27/20 0001      Strength   Right Hip Flexion 4-/5    Right Hip ABduction 4/5    Right Hip ADduction 4/5    Left Hip Flexion 4-/5    Left Hip ABduction 4/5    Left Hip ADduction 4/5    Right Knee Flexion 4-/5    Right Knee Extension 4/5    Left Knee Flexion 4-/5    Left Knee Extension 4+/5                         [facility] Adult PT Treatment/Exercise - 05/27/20 0001      Lumbar Exercises: Aerobic   Nustep L2 x31min UE/LE activity, monitored      Lumbar Exercises: Standing   Other Standing Lumbar Exercises church pew x SBA/CGA      Lumbar Exercises: Seated   Other Seated Lumbar Exercises seated core activation with red band lat pull x 20, 2# diagnols 2x10      Knee/Hip Exercises: Standing   Heel Raises Both;2 sets;10 reps      Knee/Hip Exercises: Seated   Long Arc Quad Strengthening;Both;2 sets;10 reps    Long Arc Quad Limitations with green band    Clamshell with TheraBand Green   x20 3 sec hld   Marching Strengthening;Both;2 sets;10 reps    Marching Limitations w green band    Hamstring Curl Strengthening;Both;2 sets;10 reps    Hamstring Limitations with green band    Sit to [place] 5  reps;1 set   able to perfrom in 15 seconds, with UE/modified                 PT Education - 05/27/20 1132    Education Details HEP progression    Person(s) Educated Patient    Methods Explanation;Demonstration;Handout    Comprehension Verbalized understanding;Returned demonstration            PT Short Term Goals - 04/16/20 2143      PT SHORT TERM GOAL #1  Title STG=LTG             PT Long Term Goals - 05/27/20 1121      PT LONG TERM GOAL #1   Title Patient will be independent with HEP    Baseline issued today Met 05/27/20    Time 6    Period Weeks    Status Achieved      PT LONG TERM GOAL #2   Title Patient will decrease risk of falls as noted by the ability to perform modified 5x sit to stand with UE support in 17 seconds or less.    Baseline Met 05/27/20 5x in 15 seconds    Time 6    Period Weeks    Status Achieved      PT LONG TERM GOAL #3   Title Patient will demonstrate 4/5 or greater bilateral LE MMT to improve stability during functional tasks.    Baseline 05/27/20 see flowsheet    Time 4    Period Weeks    Status Partially Met                 Plan - 05/27/20 1154    Clinical Impression Statement Patient arrived with increased pain in backfor unknown reason yet able to complete exercises today. Today issued HEP for home progression and educated patient on safety to prevent falls and posture awareness techniques with bending and lifting. Patient met all current goals except strength goal partial met. Patient is going to MD for a shot in back and will cont with HEP and reported he is joining a gym to maintain exercise.    Personal Factors and Comorbidities Comorbidity 3+;Age;Time since onset of injury/illness/exacerbation    Comorbidities Fall risk, Pacemaker/defribillator, HTN, DM, CKD, CHF, history of MI    Examination-Activity Limitations Bathing;Stand;Stairs;Locomotion Level;Transfers    Stability/Clinical Decision Making Evolving/Moderate  complexity    Rehab Potential Fair    PT Frequency 2x / week    PT Duration 6 weeks    PT Treatment/Interventions ADLs/Self Care Home Management;Cryotherapy;Ultrasound;Gait training;Stair training;Functional mobility training;Therapeutic activities;Therapeutic exercise;Balance training;Neuromuscular re-education;Manual techniques;Passive range of motion;Patient/family education    PT Next Visit Plan DC per PT    Consulted and Agree with Plan of Care Patient           Patient will benefit from skilled therapeutic intervention in order to improve the following deficits and impairments:  Decreased strength, Decreased balance, Decreased activity tolerance, Abnormal gait, Difficulty walking, Pain, Improper body mechanics, Postural dysfunction  Visit Diagnosis: Chronic bilateral low back pain, unspecified whether sciatica present  Unsteadiness on feet  Muscle weakness (generalized)     Problem List Patient Active Problem List   Diagnosis Date Noted  . Bloating 02/27/2020  . Nausea with vomiting 02/27/2020  . Abdominal pain 02/27/2020  . Iron deficiency anemia 02/08/2018  . Stage III chronic kidney disease 02/08/2018  . Diabetes mellitus (HCC) 02/08/2018  . 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  . Bacteremia due to Klebsiella pneumoniae 09/22/2017  . Sepsis due to Klebsiella pneumoniae (HCC) 09/22/2017  . Cholangitis 09/17/2017  . Leukocytosis 09/17/2017  . Cholelithiasis 09/17/2017  . Elevated liver enzymes 09/17/2017  . Acute renal failure superimposed on stage 3 chronic kidney disease (HCC) 09/17/2017  . Low blood pressure reading 09/17/2017  . Lactic acidosis 09/17/2017  . Elevated troponin 09/17/2017  . Nausea without vomiting 01/10/2017  . Gastrointestinal hemorrhage associated with intestinal diverticulosis 06/23/2016  . Carotid artery disease (HCC)   . Syncope and collapse 07/11/2015  . Ventricular  arrhythmia 07/10/2015  . Complete heart block ([place])   . ICD  (implantable cardioverter-defibrillator) discharge   . Atrial fibrillation ([place]) 06/04/2015  . Diabetes ([place]) 06/02/2015  . Gonadotropin deficiency ([place]) 06/02/2015  . GERD (gastroesophageal reflux disease) 02/18/2015  . Abdominal pain, epigastric 02/18/2015  . Pleural effusion 09/01/2014  . Rectal bleeding 03/25/2014  . Acute blood loss anemia 03/25/2014  . Adult body mass index 28.0-28.9 09/30/2013  . Diabetes mellitus, type 2 ([place]) 08/09/2013  . Type 2 diabetes mellitus without complication ([place]) 51/70/0174  . PVC's (premature ventricular contractions) 07/04/2013  . Hyperlipidemia 01/03/2013  . Automatic implantable cardioverter-defibrillator in situ 03/30/2012  . S/P MVR (mitral valve repair) 01/16/2012  . HTN (hypertension) 11/28/2011  . Chronic systolic heart failure ([place]) 10/31/2011  . Ischemic cardiomyopathy 10/31/2011  . CKD (chronic kidney disease) stage 3, GFR 30-59 ml/min (HCC) 10/31/2011  . Current use of long term anticoagulation 10/31/2011  . Long term (current) use of anticoagulants 10/28/2011  . S/P mitral valve repair 10/17/2011  . S/P CABG x 3 10/17/2011  . Mitral regurgitation 10/12/2011  . NSTEMI (non-ST elevated myocardial infarction) [facility]) 10/11/2011    Ladean Raya, PTA 05/27/20 12:05 PM  [facility] [place], [place], [address] Phone: [phone]   Fax:  [phone]  Name: Wayne Gordon MRN: [id number] Date of Birth: 1934-04-01

## 2020-05-27 NOTE — Patient Instructions (Signed)
  Lower Body: Toe Rise   Standing, place feet apart. Hold arms out for balance or use support. Rise up on toes. Hold _3___ seconds, then lower. Repeat immediately. Repeat __10__ times. Do __2__ sessions per day.   Half Squat to Chair   Stand with feet shoulder width apart. Push buttocks backward and lower slowly, sitting in chair lightly and returning to standing position. Complete _2_ sets of 10_ repetitions. Perform __2-3_ sessions per day.     Hip abduction   While sitting with good posture, tie theraband around knees and pull apart. Slowly resume starting position. x30 1-2 x day  Knee Extension: Resisted (Sitting)    With band looped around right ankle and under other foot, straighten leg with ankle loop. Keep other leg bent to increase resistance. Repeat __20__ times per set. Do __1-2__ sets per session. Do __1-2__ sessions per day.   Knee Flexion: Resisted (Sitting)    Sit with band under left foot and looped around ankle of supported leg. Pull unsupported leg back. Repeat _20___ times per set. Do __1-2__ sets per session. Do __1-2__ sessions per day.    SEATED MARCHING - ELASTIC BAND  Start by sitting in a chair with an elastic band wrapped around your lower thighs.  Next, move a knee upward, set it back down and then alternate to the other side. Perform 10-20 1-2x daily    

## 2020-05-28 ENCOUNTER — Ambulatory Visit (INDEPENDENT_AMBULATORY_CARE_PROVIDER_SITE_OTHER): Payer: Medicare HMO

## 2020-05-28 DIAGNOSIS — I255 Ischemic cardiomyopathy: Secondary | ICD-10-CM

## 2020-05-28 DIAGNOSIS — I5022 Chronic systolic (congestive) heart failure: Secondary | ICD-10-CM

## 2020-05-28 LAB — CUP PACEART REMOTE DEVICE CHECK
Battery Remaining Percentage: 30 %
Battery Voltage: 3 V
Brady Statistic RV Percent Paced: 94 %
Date Time Interrogation Session: 20210929071954
HighPow Impedance: 68 Ohm
Implantable Lead Implant Date: 20130731
Implantable Lead Location: 753860
Implantable Lead Model: 365
Implantable Lead Serial Number: 10505793
Implantable Pulse Generator Implant Date: 20130731
Lead Channel Impedance Value: 462 Ohm
Lead Channel Sensing Intrinsic Amplitude: 1.6 mV
Lead Channel Sensing Intrinsic Amplitude: 13 mV
Lead Channel Setting Pacing Amplitude: 2.5 V
Lead Channel Setting Pacing Pulse Width: 0.4 ms
Lead Channel Setting Sensing Sensitivity: 0.8 mV
Pulse Gen Serial Number: 60687186

## 2020-05-29 ENCOUNTER — Ambulatory Visit: Payer: Medicare HMO | Admitting: Gastroenterology

## 2020-06-01 NOTE — Progress Notes (Signed)
Remote ICD transmission.   

## 2020-08-26 LAB — CUP PACEART REMOTE DEVICE CHECK
Date Time Interrogation Session: 20211230081645
Implantable Lead Implant Date: 20130731
Implantable Lead Location: 753860
Implantable Lead Model: 365
Implantable Lead Serial Number: 10505793
Implantable Pulse Generator Implant Date: 20130731
Pulse Gen Serial Number: 60687186

## 2020-08-27 ENCOUNTER — Ambulatory Visit (INDEPENDENT_AMBULATORY_CARE_PROVIDER_SITE_OTHER): Payer: Medicare HMO

## 2020-08-27 DIAGNOSIS — I255 Ischemic cardiomyopathy: Secondary | ICD-10-CM | POA: Diagnosis not present

## 2020-08-27 DIAGNOSIS — I5022 Chronic systolic (congestive) heart failure: Secondary | ICD-10-CM

## 2020-08-31 ENCOUNTER — Other Ambulatory Visit: Payer: Self-pay | Admitting: Internal Medicine

## 2020-09-11 NOTE — Progress Notes (Signed)
Remote ICD transmission.   

## 2020-09-21 ENCOUNTER — Ambulatory Visit: Payer: Medicare HMO | Admitting: Internal Medicine

## 2020-09-21 ENCOUNTER — Other Ambulatory Visit: Payer: Self-pay

## 2020-09-21 ENCOUNTER — Encounter: Payer: Self-pay | Admitting: Internal Medicine

## 2020-09-21 VITALS — BP 132/76 | HR 61 | Ht 76.0 in | Wt 250.6 lb

## 2020-09-21 DIAGNOSIS — I499 Cardiac arrhythmia, unspecified: Secondary | ICD-10-CM

## 2020-09-21 DIAGNOSIS — I480 Paroxysmal atrial fibrillation: Secondary | ICD-10-CM

## 2020-09-21 DIAGNOSIS — I442 Atrioventricular block, complete: Secondary | ICD-10-CM | POA: Diagnosis not present

## 2020-09-21 DIAGNOSIS — Z95810 Presence of automatic (implantable) cardiac defibrillator: Secondary | ICD-10-CM | POA: Diagnosis not present

## 2020-09-21 DIAGNOSIS — I255 Ischemic cardiomyopathy: Secondary | ICD-10-CM

## 2020-09-21 NOTE — Progress Notes (Signed)
    HPI Mr. Sweis returns today for followup. He is a pleasant 86 yo man with a h/o sinus node dysfunction, persistent atrial fib, chronic systolic heart failure, s/p VDD ICD. The patient has very slow atrial fib with rates in the low 40's and we turned his pacing up to a lower rate of 60. He has developed class 3 CHF symptoms. He has not had any ICD shocks or syncope. No edema. He can walk about 20 yards and then get sob.    Current Outpatient Medications  Medication Sig Dispense Refill  . apixaban (ELIQUIS) 2.5 MG TABS tablet Take 1 tablet (2.5 mg total) by mouth 2 (two) times daily. 180 tablet 1  . CALCIUM-MAGNESIUM-ZINC PO Take 2 tablets by mouth daily.    . carvedilol (COREG) 12.5 MG tablet TAKE 1 TABLET BY MOUTH THREE TIMES DAILY. PLEASE KEEP SCHEDULED FOLLOW UP FOR FURTHER REFILLS 90 tablet 0  . Cyanocobalamin (VITAMIN B-12 IJ) Inject 1 mL as directed every 30 (thirty) days.     . furosemide (LASIX) 40 MG tablet Take 1 tablet by mouth daily.    . gabapentin (NEURONTIN) 100 MG capsule Take 100 mg by mouth 2 (two) times daily.    . glipiZIDE (GLUCOTROL XL) 10 MG 24 hr tablet Take 10 mg by mouth 2 times daily at 12 noon and 4 pm. Pt takes it in the AM and PM    . hydrALAZINE (APRESOLINE) 25 MG tablet Take 25 mg by mouth 3 (three) times daily as needed (for bp levels).    . Insulin Glargine (TOUJEO SOLOSTAR) 300 UNIT/ML SOPN Inject 12-15 Units as directed daily.     . losartan (COZAAR) 25 MG tablet Take 1 tablet (25 mg total) by mouth daily. 30 tablet 11  . Multiple Vitamins-Minerals (MULTIVITAMINS THER. W/MINERALS) TABS Take 1 tablet by mouth daily.    . omeprazole (PRILOSEC) 40 MG capsule Take 1 capsule (40 mg total) by mouth daily before breakfast. 90 capsule 1  . rosuvastatin (CRESTOR) 20 MG tablet Take 20 mg by mouth every evening.     . testosterone cypionate (DEPOTESTOSTERONE CYPIONATE) 200 MG/ML injection Inject 0.5 mg into the muscle every 21 ( twenty-one) days.      No  current facility-administered medications for this visit.     Past Medical History:  Diagnosis Date  . Allergy to ACE inhibitors    Angioedema many years ago; patient has tolerated Losartan (ARB) in the past - it was held during admisson for GI bleed and worsening renal function >> resume Losartan 25 mg QD 06/2015  . Arthritis   . Atrial fibrillation (HCC)    PAF, CHADs2Vasc = 5  . Carotid artery disease (HCC)    60-79% LICA  . CHF (congestive heart failure) (HCC)   . CKD (chronic kidney disease)   . Coronary artery disease   . Diabetes mellitus   . GERD (gastroesophageal reflux disease)   . Hyperlipidemia   . Hypertension   . ICD (implantable cardiac defibrillator) in place 03/28/2012   Biotronik, Dr. Chantella Creech 03/28/12  . Ischemic cardiomyopathy    S/P CABG x 3; EF 25%  . Mitral regurgitation    S/P mitral valve repair 2013  . Myocardial infarction (HCC)   . Renal insufficiency     ROS:   All systems reviewed and negative except as noted in the HPI.   Past Surgical History:  Procedure Laterality Date  . CHOLECYSTECTOMY N/A 09/20/2017   Procedure: LAPAROSCOPIC SUBTOTAL CHOLECYSTECTOMY;  Surgeon:   Axel Filler, MD;  Location: [facility] OR;  Service: General;  Laterality: N/A;  . COLONOSCOPY N/A 04/24/2014   RMR Pancolonic diverticulosis  . CORONARY ARTERY BYPASS GRAFT  10/17/2011   Procedure: CORONARY ARTERY BYPASS GRAFTING (CABG);  Surgeon: Purcell Nails, MD;  [facility] OR;  Service: Open Heart Surgery;  Laterality: N/A;  . ERCP N/A 09/29/2017   Procedure: ENDOSCOPIC RETROGRADE CHOLANGIOPANCREATOGRAPHY (ERCP);  Surgeon: Jeani Hawking, MD;  Location: [facility] ENDOSCOPY;  Service: Endoscopy;  Laterality: N/A;  . ESOPHAGOGASTRODUODENOSCOPY N/A 04/24/2014   RMR Subtle nodularity the gastric mucosa of uncertain significance-status post biopsy. Hiatal hernia. chronic inflammation, no H.pylori  . ESOPHAGOGASTRODUODENOSCOPY N/A 06/24/2016   Dr. Jena Gauss: normal esophagus, small hiatal hernia,  normal duodenum  . ICD  03/28/2012  . IMPLANTABLE CARDIOVERTER DEFIBRILLATOR IMPLANT N/A 03/28/2012   Procedure: IMPLANTABLE CARDIOVERTER DEFIBRILLATOR IMPLANT;  Surgeon: Marinus Maw, MD;  Location: [facility] CATH LAB;  Service: Cardiovascular;  Laterality: N/A;  . KNEE ARTHROSCOPY  ~ 2008   right  . LEFT AND RIGHT HEART CATHETERIZATION WITH CORONARY ANGIOGRAM N/A 10/12/2011   Procedure: LEFT AND RIGHT HEART CATHETERIZATION WITH CORONARY ANGIOGRAM;  Surgeon: Iran Ouch, MD;  Location: [facility] CATH LAB;  Service: Cardiovascular;  Laterality: N/A;  . MITRAL VALVE REPAIR  10/17/2011   Procedure: MITRAL VALVE REPAIR (MVR);  Surgeon: Purcell Nails, MD;  Location: [facility] OR;  Service: Open Heart Surgery;  Laterality: N/A;  . NASAL SINUS SURGERY  1990's   right     Family History  Problem Relation Age of Onset  . Heart disease Mother   . Diabetes Father   . Cardiomyopathy Father   . Colon cancer Neg Hx      Social History   Socioeconomic History  . Marital status: Married    Spouse name: Not on file  . Number of children: Not on file  . Years of education: Not on file  . Highest education level: Not on file  Occupational History  . Occupation: Retired  Tobacco Use  . Smoking status: Never Smoker  . Smokeless tobacco: Never Used  Vaping Use  . Vaping Use: Never used  Substance and Sexual Activity  . Alcohol use: No    Alcohol/week: 0.0 standard drinks  . Drug use: No  . Sexual activity: Yes  Other Topics Concern  . Not on file  Social History Narrative  . Not on file   Social Determinants of Health   Financial Resource Strain: Not on file  Food Insecurity: Not on file  Transportation Needs: Not on file  Physical Activity: Not on file  Stress: Not on file  Social Connections: Not on file  Intimate Partner Violence: Not on file     BP 132/76   Pulse 61   Ht 6\' 4"  (1.93 m)   Wt 250 lb 9.6 oz (113.7 kg)   SpO2 97%   BMI 30.50 kg/m   Physical Exam:  Well appearing  NAD HEENT: Unremarkable Neck:  No JVD, no thyromegally Lymphatics:  No adenopathy Back:  No CVA tenderness Lungs:  Clear with no wheezes HEART:  IRegular rate rhythm, no murmurs, no rubs, no clicks Abd:  soft, positive bowel sounds, no organomegally, no rebound, no guarding Ext:  2 plus pulses, no edema, no cyanosis, no clubbing Skin:  No rashes no nodules Neuro:  CN II through XII intact, motor grossly intact  EKG - atrial fib with ventricular pacing, with LBBB QRS  DEVICE  Normal device function.  See PaceArt for  details.   Assess/Plan: 1. Chronic systolic heart failure - his symptoms have gone from class 2 to class 3. We have had him on maximal medical therapy with a beta blocker and an ACE inhibitor.  2. ICD - his biotronik device is working normally. We will recheck in several months after his upgrade to a BiV ICD. 3. Atrial fib - his VR is very slow. 4. CAD - he is s/p MI. He denies anginal symptoms.  Sharlot Gowda Hiba Garry,MD

## 2020-09-21 NOTE — H&P (View-Only) (Signed)
HPI [name] returns today for followup. He is a pleasant 87 yo man with a h/o sinus node dysfunction, persistent atrial fib, chronic systolic heart failure, s/p VDD ICD. The patient has very slow atrial fib with rates in the low 40's and we turned his pacing up to a lower rate of 60. He has developed class 3 CHF symptoms. He has not had any ICD shocks or syncope. No edema. He can walk about 20 yards and then get sob.    Current Outpatient Medications  Medication Sig Dispense Refill  . apixaban (ELIQUIS) 2.5 MG TABS tablet Take 1 tablet (2.5 mg total) by mouth 2 (two) times daily. 180 tablet 1  . CALCIUM-MAGNESIUM-ZINC PO Take 2 tablets by mouth daily.    . carvedilol (COREG) 12.5 MG tablet TAKE 1 TABLET BY MOUTH THREE TIMES DAILY. PLEASE KEEP SCHEDULED FOLLOW UP FOR FURTHER REFILLS 90 tablet 0  . Cyanocobalamin (VITAMIN B-12 IJ) Inject 1 mL as directed every 30 (thirty) days.     . furosemide (LASIX) 40 MG tablet Take 1 tablet by mouth daily.    [name] Kitchen gabapentin (NEURONTIN) 100 MG capsule Take 100 mg by mouth 2 (two) times daily.    [name] Kitchen glipiZIDE (GLUCOTROL XL) 10 MG 24 hr tablet Take 10 mg by mouth 2 times daily at 12 noon and 4 pm. Pt takes it in the AM and PM    . hydrALAZINE (APRESOLINE) 25 MG tablet Take 25 mg by mouth 3 (three) times daily as needed (for bp levels).    . Insulin Glargine (TOUJEO SOLOSTAR) 300 UNIT/ML SOPN Inject 12-15 Units as directed daily.     [name] Kitchen losartan (COZAAR) 25 MG tablet Take 1 tablet (25 mg total) by mouth daily. 30 tablet 11  . Multiple Vitamins-Minerals (MULTIVITAMINS THER. W/MINERALS) TABS Take 1 tablet by mouth daily.    [name] Kitchen omeprazole (PRILOSEC) 40 MG capsule Take 1 capsule (40 mg total) by mouth daily before breakfast. 90 capsule 1  . rosuvastatin (CRESTOR) 20 MG tablet Take 20 mg by mouth every evening.     . testosterone cypionate (DEPOTESTOSTERONE CYPIONATE) 200 MG/ML injection Inject 0.5 mg into the muscle every 21 ( twenty-one) days.      No  current facility-administered medications for this visit.     Past Medical History:  Diagnosis Date  . Allergy to ACE inhibitors    Angioedema many years ago; patient has tolerated Losartan (ARB) in the past - it was held during [facility] for GI bleed and worsening renal function >> resume Losartan 25 mg QD 06/2015  . Arthritis   . Atrial fibrillation (HCC)    PAF, CHADs2Vasc = 5  . Carotid artery disease (HCC)    60-79% LICA  . CHF (congestive heart failure) (HCC)   . CKD (chronic kidney disease)   . Coronary artery disease   . Diabetes mellitus   . GERD (gastroesophageal reflux disease)   . Hyperlipidemia   . Hypertension   . ICD (implantable cardiac defibrillator) in place 03/28/2012   Biotronik, Dr. Ladona Ridgel 03/28/12  . Ischemic cardiomyopathy    S/P CABG x 3; EF 25%  . Mitral regurgitation    S/P mitral valve repair 2013  . Myocardial infarction (HCC)   . Renal insufficiency     ROS:   All systems reviewed and negative except as noted in the HPI.   Past Surgical History:  Procedure Laterality Date  . CHOLECYSTECTOMY N/A 09/20/2017   Procedure: LAPAROSCOPIC SUBTOTAL CHOLECYSTECTOMY;  Surgeon:  Axel Filler, MD;  Location: [facility] OR;  Service: General;  Laterality: N/A;  . COLONOSCOPY N/A 04/24/2014   RMR Pancolonic diverticulosis  . CORONARY ARTERY BYPASS GRAFT  10/17/2011   Procedure: CORONARY ARTERY BYPASS GRAFTING (CABG);  Surgeon: Purcell Nails, MD;  Location: [facility] OR;  Service: Open Heart Surgery;  Laterality: N/A;  . ERCP N/A 09/29/2017   Procedure: ENDOSCOPIC RETROGRADE CHOLANGIOPANCREATOGRAPHY (ERCP);  Surgeon: Jeani Hawking, MD;  Location: [facility] ENDOSCOPY;  Service: Endoscopy;  Laterality: N/A;  . ESOPHAGOGASTRODUODENOSCOPY N/A 04/24/2014   RMR Subtle nodularity the gastric mucosa of uncertain significance-status post biopsy. Hiatal hernia. chronic inflammation, no H.pylori  . ESOPHAGOGASTRODUODENOSCOPY N/A 06/24/2016   Dr. Jena Gauss: normal esophagus, small hiatal hernia,  normal duodenum  . ICD  03/28/2012  . IMPLANTABLE CARDIOVERTER DEFIBRILLATOR IMPLANT N/A 03/28/2012   Procedure: IMPLANTABLE CARDIOVERTER DEFIBRILLATOR IMPLANT;  Surgeon: Marinus Maw, MD;  Location: [facility] CATH LAB;  Service: Cardiovascular;  Laterality: N/A;  . KNEE ARTHROSCOPY  ~ 2008   right  . LEFT AND RIGHT HEART CATHETERIZATION WITH CORONARY ANGIOGRAM N/A 10/12/2011   Procedure: LEFT AND RIGHT HEART CATHETERIZATION WITH CORONARY ANGIOGRAM;  Surgeon: Iran Ouch, MD;  Location: [facility] CATH LAB;  Service: Cardiovascular;  Laterality: N/A;  . MITRAL VALVE REPAIR  10/17/2011   Procedure: MITRAL VALVE REPAIR (MVR);  Surgeon: Purcell Nails, MD;  Location: [facility] OR;  Service: Open Heart Surgery;  Laterality: N/A;  . NASAL SINUS SURGERY  1990's   right     Family History  Problem Relation Age of Onset  . Heart disease Mother   . Diabetes Father   . Cardiomyopathy Father   . Colon cancer Neg Hx      Social History   Socioeconomic History  . Marital status: Married    Spouse name: Not on file  . Number of children: Not on file  . Years of education: Not on file  . Highest education level: Not on file  Occupational History  . Occupation: Retired  Tobacco Use  . Smoking status: Never Smoker  . Smokeless tobacco: Never Used  Vaping Use  . Vaping Use: Never used  Substance and Sexual Activity  . Alcohol use: No    Alcohol/week: 0.0 standard drinks  . Drug use: No  . Sexual activity: Yes  Other Topics Concern  . Not on file  Social History Narrative  . Not on file   Social Determinants of Health   Financial Resource Strain: Not on file  Food Insecurity: Not on file  Transportation Needs: Not on file  Physical Activity: Not on file  Stress: Not on file  Social Connections: Not on file  Intimate Partner Violence: Not on file     BP 132/76   Pulse 61   Ht 6\' 4"  (1.93 m)   Wt 250 lb 9.6 oz (113.7 kg)   SpO2 97%   BMI 30.50 kg/m   Physical Exam:  Well appearing  NAD HEENT: Unremarkable Neck:  No JVD, no thyromegally Lymphatics:  No adenopathy Back:  No CVA tenderness Lungs:  Clear with no wheezes HEART:  IRegular rate rhythm, no murmurs, no rubs, no clicks Abd:  soft, positive bowel sounds, no organomegally, no rebound, no guarding Ext:  2 plus pulses, no edema, no cyanosis, no clubbing Skin:  No rashes no nodules Neuro:  CN II through XII intact, motor grossly intact  EKG - atrial fib with ventricular pacing, with LBBB QRS  DEVICE  Normal device function.  See PaceArt for  details.   Assess/Plan: 1. Chronic systolic heart failure - his symptoms have gone from class 2 to class 3. We have had him on maximal medical therapy with a beta blocker and an ACE inhibitor.  2. ICD - his biotronik device is working normally. We will recheck in several months after his upgrade to a BiV ICD. 3. Atrial fib - his VR is very slow. 4. CAD - he is s/p MI. He denies anginal symptoms.  Sharlot Gowda Burnie Therien,MD

## 2020-09-21 NOTE — Patient Instructions (Addendum)
Medication Instructions:  Your physician recommends that you continue on your current medications as directed. Please refer to the Current Medication list given to you today.  Labwork: You will get lab work today:  BMP and CBC-will get at PCP appt 09/23/2020 and fax to this nurse  Testing/Procedures: None ordered.  Follow-Up:  SEE INSTRUCTION LETTER  Any Other Special Instructions Will Be Listed Below (If Applicable).  If you need a refill on your cardiac medications before your next appointment, please call your pharmacy.    What is cardiac resynchronization therapy?  Cardiac resynchronization therapy (CRT) is used to treat the delay in heart ventricle contractions that occur in some people with advanced heart failure Heart failure means the heart's pumping power is weaker than normal. With heart failure, blood moves through the heart and body at a slower rate, and pressure in the heart increases. A delay between the contraction of the right and left ventricles often occurs with heart failure, so the walls of the left ventricle are unable to contract at the same time. The CRT pacing device (also called a biventricular pacemaker) is an electronic, battery-powered device that is surgically implanted under the skin. The device has 2 or 3 leads (wires) that are positioned in the heart to help the heart beat in a more balanced way. The leads are implanted through a vein in the right atrium and right ventricle and into the coronary sinus vein to pace the left ventricle.

## 2020-09-22 LAB — CUP PACEART INCLINIC DEVICE CHECK
Battery Voltage: 2.97 V
Brady Statistic RV Percent Paced: 91 %
Date Time Interrogation Session: 20220124165600
HighPow Impedance: 67 Ohm
Implantable Lead Implant Date: 20130731
Implantable Lead Location: 753860
Implantable Lead Model: 365
Implantable Lead Serial Number: 10505793
Implantable Pulse Generator Implant Date: 20130731
Lead Channel Impedance Value: 491 Ohm
Lead Channel Pacing Threshold Amplitude: 0.5 V
Lead Channel Pacing Threshold Pulse Width: 0.4 ms
Lead Channel Sensing Intrinsic Amplitude: 1.6 mV
Lead Channel Sensing Intrinsic Amplitude: 4.4 mV
Lead Channel Setting Pacing Amplitude: 2.5 V
Lead Channel Setting Pacing Pulse Width: 0.4 ms
Lead Channel Setting Sensing Sensitivity: 0.8 mV
Pulse Gen Serial Number: 60687186

## 2020-10-09 ENCOUNTER — Other Ambulatory Visit: Payer: Self-pay | Admitting: Internal Medicine

## 2020-10-09 ENCOUNTER — Other Ambulatory Visit (HOSPITAL_COMMUNITY)
Admission: RE | Admit: 2020-10-09 | Discharge: 2020-10-09 | Disposition: A | Discharge status: Home / Self Care | Payer: Medicare HMO | Source: Ambulatory Visit | Attending: Internal Medicine | Admitting: Internal Medicine

## 2020-10-09 DIAGNOSIS — Z01812 Encounter for preprocedural laboratory examination: Secondary | ICD-10-CM | POA: Diagnosis present

## 2020-10-09 DIAGNOSIS — Z20822 Contact with and (suspected) exposure to covid-19: Secondary | ICD-10-CM | POA: Diagnosis not present

## 2020-10-09 LAB — SARS CORONAVIRUS 2 (TAT 6-24 HRS): SARS Coronavirus 2: NEGATIVE

## 2020-10-09 NOTE — Progress Notes (Signed)
Instructed patient on the following items: Arrival time 0530 Nothing to eat or drink after midnight No meds AM of procedure Responsible person to drive you home and stay with you for 24 hrs Wash with special soap night before and morning of procedure If on anti-coagulant drug instructions Eliquis- last does today 2/11

## 2020-10-12 ENCOUNTER — Ambulatory Visit (HOSPITAL_COMMUNITY)
Admission: RE | Admit: 2020-10-12 | Discharge: 2020-10-12 | Disposition: A | Discharge status: Home / Self Care | Payer: Medicare HMO | Attending: Internal Medicine | Admitting: Internal Medicine

## 2020-10-12 ENCOUNTER — Other Ambulatory Visit: Payer: Self-pay

## 2020-10-12 ENCOUNTER — Ambulatory Visit (HOSPITAL_COMMUNITY): Payer: Medicare HMO

## 2020-10-12 ENCOUNTER — Ambulatory Visit (HOSPITAL_COMMUNITY)
Admission: RE | Disposition: A | Discharge status: Home / Self Care | Payer: Self-pay | Source: Home / Self Care | Attending: Internal Medicine

## 2020-10-12 DIAGNOSIS — I255 Ischemic cardiomyopathy: Secondary | ICD-10-CM | POA: Insufficient documentation

## 2020-10-12 DIAGNOSIS — Z794 Long term (current) use of insulin: Secondary | ICD-10-CM | POA: Diagnosis not present

## 2020-10-12 DIAGNOSIS — I252 Old myocardial infarction: Secondary | ICD-10-CM | POA: Insufficient documentation

## 2020-10-12 DIAGNOSIS — Z951 Presence of aortocoronary bypass graft: Secondary | ICD-10-CM | POA: Insufficient documentation

## 2020-10-12 DIAGNOSIS — N189 Chronic kidney disease, unspecified: Secondary | ICD-10-CM | POA: Insufficient documentation

## 2020-10-12 DIAGNOSIS — Z833 Family history of diabetes mellitus: Secondary | ICD-10-CM | POA: Diagnosis not present

## 2020-10-12 DIAGNOSIS — Z7901 Long term (current) use of anticoagulants: Secondary | ICD-10-CM | POA: Insufficient documentation

## 2020-10-12 DIAGNOSIS — I130 Hypertensive heart and chronic kidney disease with heart failure and stage 1 through stage 4 chronic kidney disease, or unspecified chronic kidney disease: Secondary | ICD-10-CM | POA: Diagnosis not present

## 2020-10-12 DIAGNOSIS — E1122 Type 2 diabetes mellitus with diabetic chronic kidney disease: Secondary | ICD-10-CM | POA: Insufficient documentation

## 2020-10-12 DIAGNOSIS — Z4502 Encounter for adjustment and management of automatic implantable cardiac defibrillator: Secondary | ICD-10-CM | POA: Insufficient documentation

## 2020-10-12 DIAGNOSIS — I4892 Unspecified atrial flutter: Secondary | ICD-10-CM | POA: Diagnosis not present

## 2020-10-12 DIAGNOSIS — Z9049 Acquired absence of other specified parts of digestive tract: Secondary | ICD-10-CM | POA: Diagnosis not present

## 2020-10-12 DIAGNOSIS — I442 Atrioventricular block, complete: Secondary | ICD-10-CM | POA: Diagnosis not present

## 2020-10-12 DIAGNOSIS — Z888 Allergy status to other drugs, medicaments and biological substances status: Secondary | ICD-10-CM | POA: Insufficient documentation

## 2020-10-12 DIAGNOSIS — E785 Hyperlipidemia, unspecified: Secondary | ICD-10-CM | POA: Diagnosis not present

## 2020-10-12 DIAGNOSIS — I340 Nonrheumatic mitral (valve) insufficiency: Secondary | ICD-10-CM | POA: Insufficient documentation

## 2020-10-12 DIAGNOSIS — Z79899 Other long term (current) drug therapy: Secondary | ICD-10-CM | POA: Diagnosis not present

## 2020-10-12 DIAGNOSIS — Z8249 Family history of ischemic heart disease and other diseases of the circulatory system: Secondary | ICD-10-CM | POA: Insufficient documentation

## 2020-10-12 DIAGNOSIS — I5022 Chronic systolic (congestive) heart failure: Secondary | ICD-10-CM | POA: Insufficient documentation

## 2020-10-12 DIAGNOSIS — I2510 Atherosclerotic heart disease of native coronary artery without angina pectoris: Secondary | ICD-10-CM | POA: Insufficient documentation

## 2020-10-12 DIAGNOSIS — Z95810 Presence of automatic (implantable) cardiac defibrillator: Secondary | ICD-10-CM

## 2020-10-12 HISTORY — PX: BIV UPGRADE: EP1202

## 2020-10-12 LAB — BASIC METABOLIC PANEL
Anion gap: 11 (ref 5–15)
BUN: 30 mg/dL — ABNORMAL HIGH (ref 8–23)
CO2: 27 mmol/L (ref 22–32)
Calcium: 9 mg/dL (ref 8.9–10.3)
Chloride: 99 mmol/L (ref 98–111)
Creatinine, Ser: 2.02 mg/dL — ABNORMAL HIGH (ref 0.61–1.24)
GFR, Estimated: 32 mL/min — ABNORMAL LOW (ref >60)
Glucose, Bld: 107 mg/dL — ABNORMAL HIGH (ref 70–99)
Potassium: 3.4 mmol/L — ABNORMAL LOW (ref 3.5–5.1)
Sodium: 137 mmol/L (ref 135–145)

## 2020-10-12 LAB — GLUCOSE, CAPILLARY: Glucose-Capillary: 101 mg/dL — ABNORMAL HIGH (ref 70–99)

## 2020-10-12 LAB — CBC
HCT: 41.2 % (ref 39.0–52.0)
Hemoglobin: 12.4 g/dL — ABNORMAL LOW (ref 13.0–17.0)
MCH: 25.6 pg — ABNORMAL LOW (ref 26.0–34.0)
MCHC: 30.1 g/dL (ref 30.0–36.0)
MCV: 85.1 fL (ref 80.0–100.0)
Platelets: 185 10*3/uL (ref 150–400)
RBC: 4.84 MIL/uL (ref 4.22–5.81)
RDW: 19.1 % — ABNORMAL HIGH (ref 11.5–15.5)
WBC: 5.1 10*3/uL (ref 4.0–10.5)
nRBC: 0 % (ref 0.0–0.2)

## 2020-10-12 SURGERY — BIV UPGRADE

## 2020-10-12 MED ORDER — SODIUM CHLORIDE 0.9 % IV SOLN
80.0000 mg | INTRAVENOUS | Status: AC
  Administered 2020-10-12: 80 mg

## 2020-10-12 MED ORDER — SODIUM CHLORIDE 0.9 % IV SOLN: INTRAVENOUS | Status: DC

## 2020-10-12 MED ORDER — LIDOCAINE HCL 1 % IJ SOLN
INTRAMUSCULAR | Status: AC
  Filled 2020-10-12: qty 60

## 2020-10-12 MED ORDER — VANCOMYCIN HCL 500 MG/100ML IV SOLN
500.0000 mg | INTRAVENOUS | Status: DC
  Filled 2020-10-12 (×2): qty 100

## 2020-10-12 MED ORDER — MIDAZOLAM HCL 5 MG/5ML IJ SOLN
INTRAMUSCULAR | Status: DC | PRN
  Administered 2020-10-12 (×4): 1 mg via INTRAVENOUS

## 2020-10-12 MED ORDER — ACETAMINOPHEN 325 MG PO TABS
325.0000 mg | ORAL_TABLET | ORAL | Status: DC | PRN
  Filled 2020-10-12: qty 2

## 2020-10-12 MED ORDER — VANCOMYCIN HCL 1500 MG/300ML IV SOLN
1500.0000 mg | INTRAVENOUS | Status: AC
  Administered 2020-10-12: 1500 mg via INTRAVENOUS

## 2020-10-12 MED ORDER — FENTANYL CITRATE (PF) 100 MCG/2ML IJ SOLN
INTRAMUSCULAR | Status: DC | PRN
  Administered 2020-10-12 (×3): 12.5 ug via INTRAVENOUS

## 2020-10-12 MED ORDER — IOHEXOL 350 MG/ML SOLN
INTRAVENOUS | Status: DC | PRN
  Administered 2020-10-12: 5 mL
  Administered 2020-10-12: 10 mL
  Administered 2020-10-12: 5 mL

## 2020-10-12 MED ORDER — SODIUM CHLORIDE 0.9 % IV SOLN
INTRAVENOUS | Status: AC
  Filled 2020-10-12: qty 2

## 2020-10-12 MED ORDER — POVIDONE-IODINE 10 % EX SWAB: 2.0000 "application " | Freq: Once | CUTANEOUS | Status: DC

## 2020-10-12 MED ORDER — IOHEXOL 350 MG/ML SOLN
INTRAVENOUS | Status: AC
  Filled 2020-10-12: qty 1

## 2020-10-12 MED ORDER — HEPARIN (PORCINE) IN NACL 1000-0.9 UT/500ML-% IV SOLN
INTRAVENOUS | Status: DC | PRN
  Administered 2020-10-12: 500 mL

## 2020-10-12 MED ORDER — HEPARIN (PORCINE) IN NACL 1000-0.9 UT/500ML-% IV SOLN
INTRAVENOUS | Status: AC
  Filled 2020-10-12: qty 500

## 2020-10-12 MED ORDER — SODIUM CHLORIDE 0.9 % IV SOLN
INTRAVENOUS | Status: DC | PRN
  Administered 2020-10-12: 500 mL

## 2020-10-12 MED ORDER — POTASSIUM CHLORIDE CRYS ER 20 MEQ PO TBCR
20.0000 meq | EXTENDED_RELEASE_TABLET | Freq: Once | ORAL | Status: AC
  Administered 2020-10-12: 20 meq via ORAL
  Filled 2020-10-12: qty 1

## 2020-10-12 MED ORDER — FENTANYL CITRATE (PF) 100 MCG/2ML IJ SOLN
INTRAMUSCULAR | Status: AC
  Filled 2020-10-12: qty 2

## 2020-10-12 MED ORDER — VANCOMYCIN HCL IN DEXTROSE 1-5 GM/200ML-% IV SOLN
INTRAVENOUS | Status: AC
  Filled 2020-10-12: qty 200

## 2020-10-12 MED ORDER — CHLORHEXIDINE GLUCONATE 4 % EX LIQD: 4.0000 "application " | Freq: Once | CUTANEOUS | Status: DC

## 2020-10-12 MED ORDER — ONDANSETRON HCL 4 MG/2ML IJ SOLN: 4.0000 mg | Freq: Four times a day (QID) | INTRAMUSCULAR | Status: DC | PRN

## 2020-10-12 MED ORDER — MIDAZOLAM HCL 5 MG/5ML IJ SOLN
INTRAMUSCULAR | Status: AC
  Filled 2020-10-12: qty 5

## 2020-10-12 SURGICAL SUPPLY — 20 items
CABLE SURGICAL S-101-97-12 (CABLE) ×2 IMPLANT
CATH ATTAIN COM SURV 6250V-EH (CATHETERS) ×1 IMPLANT
CATH JOSEPHSON 2-5-2 125 (CATHETERS) ×1 IMPLANT
CATH JOSEPHSON QUAD-ALLRED 6FR (CATHETERS) ×1 IMPLANT
CATH RIGHTSITE C315HIS02 (CATHETERS) ×1 IMPLANT
DEFIBRILATOR INTICA NEO HFT (ICD Generator) IMPLANT
GUIDEWIRE ANGLED .035X150CM (WIRE) ×1 IMPLANT
INTICA NEO HF-T DF1 429553 (ICD Generator) ×2 IMPLANT
KIT ACCESSORY SELECTRA FIX CVD (MISCELLANEOUS) ×1 IMPLANT
LEAD SELECT SECURE 3830 383069 (Lead) IMPLANT
LEAD SOLIA S PRO MRI 53 (Lead) ×1 IMPLANT
PAD PRO RADIOLUCENT 2001M-C (PAD) ×2 IMPLANT
SELECT SECURE 3830 383069 (Lead) ×2 IMPLANT
SELECTRA HOOK 55 CM 375530 (CATHETERS) ×1 IMPLANT
SHEATH 7FR PRELUDE SNAP 13 (SHEATH) ×1 IMPLANT
SHEATH 9.5FR PRELUDE SNAP 13 (SHEATH) ×1 IMPLANT
SLITTER 6232ADJ (MISCELLANEOUS) ×1 IMPLANT
TRAY PACEMAKER INSERTION (PACKS) ×2 IMPLANT
WIRE ACUITY WHISPER EDS 4648 (WIRE) ×1 IMPLANT
WIRE MAILMAN 182CM (WIRE) ×1 IMPLANT

## 2020-10-12 NOTE — Discharge Instructions (Signed)
After Your ICD (Implantable Cardiac Defibrillator)      . You have a Biotronik ICD  . Do not lift your arm above shoulder height for 1 week after your procedure. After 7 days, you may progress as below.     Monday October 19, 2020  Tuesday October 20, 2020 Wednesday October 21, 2020 Thursday October 22, 2020   . Do not lift, push, pull, or carry anything over 10 pounds with the affected arm until 6 weeks (11/23/20) after your procedure.   . Monitor your defibrillator site for redness, swelling, and drainage. Call the device clinic at [phone] if you experience these symptoms or fever/chills.  . Your incision is closed with Steri-strips:  You may shower 7 days after your procedure and wash your incision with soap and water. Avoid lotions, ointments, or perfumes over your incision until it is well-healed.  . You may use a hot tub or a pool AFTER your wound check appointment if the incision is completely closed.   . Your ICD is designed to protect you from life threatening heart rhythms. Because of this, you may receive a shock.   o 1 shock with no symptoms:  Call the office during business hours. o 1 shock with symptoms (chest pain, chest pressure, dizziness, lightheadedness, shortness of breath, overall feeling unwell):  Call 911. o If you experience 2 or more shocks in 24 hours:  Call 911. o If you receive a shock, you should not drive for 6 months per the [place] DMV IF you receive appropriate therapy from your ICD.   . ICD Alerts:  Some alerts are vibratory and others beep. These are NOT emergencies. Please call our office to let us know. If this occurs at night or on weekends, it can wait until the next business day. Send a remote transmission.  . If your device is capable of reading fluid status (for heart failure), you will be offered monthly monitoring to review this with you.   . Remote monitoring is used to monitor your ICD from home. This monitoring is scheduled every 91  days by our office. It allows [place] to keep an eye on the functioning of your device to ensure it is working properly. You will routinely see your Electrophysiologist annually (more often if necessary).

## 2020-10-12 NOTE — H&P (Signed)
ICD Criteria  Current LVEF:25%. Within 12 months prior to implant: Yes   Heart failure history: Yes, Class III  Cardiomyopathy history: Yes, Ischemic Cardiomyopathy - Prior MI.  Atrial Fibrillation/Atrial Flutter: Yes, Persistent (> 7 Days).  Ventricular tachycardia history: Yes, Hemodynamic instability present. VT Type: Sustained Ventricular Tachycardia - Polymorphic.  Cardiac arrest history: No.  History of syndromes with risk of sudden death: No.  Previous ICD: Yes, Reason for ICD:  Primary prevention.  Current ICD indication: Secondary  PPM indication: Yes. Pacing type: Ventricular. Greater than 40% RV pacing requirement anticipated. Indication: Complete Heart Block  Class I or II Bradycardia indication present: Yes  Beta Blocker therapy for 3 or more months: Yes, prescribed.   Ace Inhibitor/ARB therapy for 3 or more months: Yes, prescribed.    I have seen Wayne Gordon is a 87 y.o. malepre-procedural and has been referred by Dr. Wyline Mood for consideration of ICD implant for secondary prevention of sudden death.  The patient's chart has been reviewed and they meet criteria for ICD implant.  I have had a thorough discussion with the patient reviewing options.  The patient and their family (if available) have had opportunities to ask questions and have them answered. The patient and I have decided together through the [facility] Heart Care Share Decision Support Tool to proceed with ICD at this time.  Risks, benefits, alternatives to ICD implantation were discussed in detail with the patient today. The patient  understands that the risks include but are not limited to bleeding, infection, pneumothorax, perforation, tamponade, vascular damage, renal failure, MI, stroke, death, inappropriate shocks, and lead dislodgement and he wishes to proceed.

## 2020-10-12 NOTE — Interval H&P Note (Signed)
History and Physical Interval Note:  10/12/2020 7:28 AM  Wayne Gordon  has presented today for surgery, with the diagnosis of cardiomyopathy.  The various methods of treatment have been discussed with the patient and family. After consideration of risks, benefits and other options for treatment, the patient has consented to  Procedure(s): BIV ICD UPGRADE (N/A) as a surgical intervention.  The patient's history has been reviewed, patient examined, no change in status, stable for surgery.  I have reviewed the patient's chart and labs.  Questions were answered to the patient's satisfaction.     Lewayne Bunting

## 2020-10-12 NOTE — Progress Notes (Signed)
Discharge instructions reviewed with pt and his wife (via telephone) both voice understanding.  

## 2020-10-13 ENCOUNTER — Encounter (HOSPITAL_COMMUNITY): Payer: Self-pay | Admitting: Internal Medicine

## 2020-10-13 MED FILL — Lidocaine HCl Local Inj 1%: INTRAMUSCULAR | Qty: 60 | Status: AC

## 2020-10-13 MED FILL — Vancomycin HCl IV Soln 1500 MG/300ML (Base Equivalent): INTRAVENOUS | Qty: 300 | Status: AC

## 2020-10-13 MED FILL — Gentamicin Sulfate Inj 40 MG/ML: INTRAMUSCULAR | Qty: 80 | Status: AC

## 2020-10-14 ENCOUNTER — Telehealth: Payer: Self-pay

## 2020-10-14 NOTE — Telephone Encounter (Signed)
Follow-up after same day discharge: Implant date: 10/12/20 MD: Lewayne Bunting, MD Device: Biotronik CRT-D Location: Left chest   Wound check visit: 10/22/20 90 day MD follow-up: 01/12/21  Remote Transmission received:Yes- nightly on Biotronik   Dressing removed: Not yet, pt misnderstood date dressing could be removed.  Advised to remove outer dressing today, leave steri-strips intact.    Reviewed activity restrictions, pt initially did not think he had page with instructions.  Packet resent via mychart.  Pt then was able to locate package.

## 2020-10-14 NOTE — Telephone Encounter (Signed)
-----   Message from [facility] [place], [place] sent at 10/14/2020 10:15 AM EST ----- He was a CRT-D updrade  Monday, went home same day  [employer], forgot to send messages monday

## 2020-10-22 ENCOUNTER — Ambulatory Visit (INDEPENDENT_AMBULATORY_CARE_PROVIDER_SITE_OTHER): Payer: Medicare HMO | Admitting: Emergency Medicine

## 2020-10-22 ENCOUNTER — Other Ambulatory Visit: Payer: Self-pay

## 2020-10-22 DIAGNOSIS — I5022 Chronic systolic (congestive) heart failure: Secondary | ICD-10-CM | POA: Diagnosis not present

## 2020-10-22 DIAGNOSIS — I255 Ischemic cardiomyopathy: Secondary | ICD-10-CM

## 2020-10-22 DIAGNOSIS — Z4502 Encounter for adjustment and management of automatic implantable cardiac defibrillator: Secondary | ICD-10-CM

## 2020-10-23 LAB — CUP PACEART INCLINIC DEVICE CHECK
Battery Remaining Percentage: 100 %
Battery Voltage: 3.21 V
Brady Statistic RV Percent Paced: 91 %
Date Time Interrogation Session: 20220224122800
HighPow Impedance: 52 Ohm
Implantable Lead Implant Date: 20130731
Implantable Lead Implant Date: 20220214
Implantable Lead Implant Date: 20220214
Implantable Lead Location: 753858
Implantable Lead Location: 753859
Implantable Lead Location: 753860
Implantable Lead Model: 365
Implantable Lead Model: 377
Implantable Lead Model: 3830
Implantable Lead Serial Number: 10505793
Implantable Lead Serial Number: 8000207354
Implantable Pulse Generator Implant Date: 20220214
Lead Channel Impedance Value: 503 Ohm
Lead Channel Impedance Value: 522 Ohm
Lead Channel Impedance Value: 698 Ohm
Lead Channel Pacing Threshold Amplitude: 0.5 V
Lead Channel Pacing Threshold Amplitude: 1 V
Lead Channel Pacing Threshold Pulse Width: 0.4 ms
Lead Channel Pacing Threshold Pulse Width: 0.4 ms
Lead Channel Sensing Intrinsic Amplitude: 3.8 mV
Lead Channel Setting Pacing Amplitude: 2.5 V
Lead Channel Setting Pacing Amplitude: 3 V
Lead Channel Setting Pacing Pulse Width: 0.4 ms
Lead Channel Setting Pacing Pulse Width: 0.4 ms
Lead Channel Setting Sensing Sensitivity: 0.8 mV
Lead Channel Setting Sensing Sensitivity: 1.6 mV
Pulse Gen Model: 429553
Pulse Gen Serial Number: 84824950

## 2020-10-23 NOTE — Progress Notes (Signed)
CRT-D device check and wound check in office. Steri-strips removed, wound edges approximated, no edema, redness, or drainage. Thresholds and sensing consistent with previous device measurements. Lead impedance trends stable over time. Ongoing AF, + Eliquis. No ventricular arrhythmia episodes recorded. Patient bi-ventricularly pacing 91% of the time. Device programmed with appropriate safety margins. Heart failure diagnostics reviewed and trends are stable for patient. Audible/vibratory alerts demonstrated for patient. No changes made this session .  Patient enrolled in remote follow up and next remote 11/26/20. Follow-up with Dr Ladona Ridgel 01/12/21. Patient education completed including shock plan.

## 2020-11-17 ENCOUNTER — Telehealth: Payer: Self-pay | Admitting: Internal Medicine

## 2020-11-17 NOTE — Telephone Encounter (Signed)
Please contact requesting office for details surrounding procedure.  Thank you.

## 2020-11-17 NOTE — Telephone Encounter (Signed)
Patient states he is having a knee injection with [employer]. The injection is pending clearance and they will not schedule until they speak with our office. I made the patient aware that they will have to fax or call our office for a formal clearance request, but the patient states their office is refusing to contact us. He states they told him that we have to contact them. He does not have additional information about the knee injection, but he provided a phone number for their office, [phone].

## 2020-11-17 NOTE — Telephone Encounter (Signed)
Patient with diagnosis of ATRIAL FIBRILLATION on ELIQUIS 2.5MG  for anticoagulation.    Procedure: KENALOG INJECTION IN BOTH KNEES Date of procedure: TBD   CHA2DS2-VASc Score = 6  This indicates a 9.7% annual risk of stroke. The patient's score is based upon: CHF History: Yes HTN History: Yes Diabetes History: Yes Stroke History: No Vascular Disease History: Yes Age Score: 2 Gender Score: 0   CrCl = 43ML/MIN Platelet count = 185  Per office protocol, patient can hold ELIQUIS for 2 days prior to procedure.    Patient WILL NOT need bridging with Lovenox (enoxaparin) around procedure.  Patient should restart ELIQUIS on the evening of procedure or day after, at discretion of procedure MD.

## 2020-11-17 NOTE — Telephone Encounter (Signed)
   [facility] Pre-operative Risk Assessment    HEARTCARE STAFF: - Please ensure there is not already an duplicate clearance open for this procedure. - Under Visit Info/Reason for Call, type in Other and utilize the format Clearance MM/DD/YY or Clearance TBD. Do not use dashes or single digits. - If request is for dental extraction, please clarify the # of teeth to be extracted.  Request for surgical clearance:  1. What type of surgery is being performed? Kenalog injection,  80 mL if both knees  2. When is this surgery scheduled? TBD   3. What type of clearance is required (medical clearance vs. Pharmacy clearance to hold med vs. Both)? Both   4. Are there any medications that need to be held prior to surgery and how long? Eliquis   5. Practice name and name of physician performing surgery? [facility], Dr. Judithann Sauger   6. What is the office phone number? [phone]   7.   What is the office fax number? [phone]  8.   Anesthesia type (None, local, MAC, general) ? Lidocaine   Wayne Gordon 11/17/2020, 3:10 PM  _________________________________________________________________   Requesting office states they have been giving patient injections for years without cardiac clearance that the patient is the one who asked for clearance since he has a new defibrillator placed in Feb

## 2020-11-18 NOTE — Telephone Encounter (Signed)
   Primary Cardiologist: Lewayne Bunting, MD  Chart reviewed as part of pre-operative protocol coverage. Given past medical history and time since last visit, based on ACC/AHA guidelines, Wayne Gordon would be at acceptable risk for the planned procedure without further cardiovascular testing.   Patient with diagnosis of ATRIAL FIBRILLATION on ELIQUIS 2.5MG  for anticoagulation.    Procedure: KENALOG INJECTION IN BOTH KNEES Date of procedure: TBD   CHA2DS2-VASc Score = 6  This indicates a 9.7% annual risk of stroke. The patient's score is based upon: CHF History: Yes HTN History: Yes Diabetes History: Yes Stroke History: No Vascular Disease History: Yes Age Score: 2 Gender Score: 0   CrCl = 43ML/MIN Platelet count = 185  Per office protocol, patient can hold ELIQUIS for 2 days prior to procedure.    Patient WILL NOT need bridging with Lovenox (enoxaparin) around procedure.  Patient should restart ELIQUIS on the evening of procedure or day after, at discretion of procedure MD.  Patient was advised that if he develops new symptoms prior to surgery to contact our office to arrange a follow-up appointment.  He verbalized understanding.  I will route this recommendation to the requesting party via Epic fax function and remove from pre-op pool.  Please call with questions.  Thomasene Ripple. Cleaver NP-C    11/18/2020, 9:05 AM [facility] [address] Office [phone] Fax [phone]

## 2020-11-18 NOTE — Telephone Encounter (Signed)
Forwarded to requesting party via Epic fax function 

## 2020-11-24 ENCOUNTER — Other Ambulatory Visit: Payer: Self-pay | Admitting: Internal Medicine

## 2020-11-25 ENCOUNTER — Other Ambulatory Visit: Payer: Self-pay

## 2020-11-25 LAB — CUP PACEART REMOTE DEVICE CHECK
Battery Voltage: 3.2 V
Brady Statistic RA Percent Paced: 0 %
Brady Statistic RV Percent Paced: 94 %
Date Time Interrogation Session: 20220330142050
HighPow Impedance: 59 Ohm
Implantable Lead Implant Date: 20130731
Implantable Lead Implant Date: 20220214
Implantable Lead Implant Date: 20220214
Implantable Lead Location: 753858
Implantable Lead Location: 753859
Implantable Lead Location: 753860
Implantable Lead Model: 365
Implantable Lead Model: 377
Implantable Lead Model: 3830
Implantable Lead Serial Number: 10505793
Implantable Lead Serial Number: 8000207354
Implantable Pulse Generator Implant Date: 20220214
Lead Channel Impedance Value: 503 Ohm
Lead Channel Impedance Value: 581 Ohm
Lead Channel Impedance Value: 659 Ohm
Lead Channel Pacing Threshold Amplitude: 0.6 V
Lead Channel Pacing Threshold Amplitude: 0.9 V
Lead Channel Pacing Threshold Pulse Width: 0.4 ms
Lead Channel Pacing Threshold Pulse Width: 0.4 ms
Lead Channel Sensing Intrinsic Amplitude: 3.3 mV
Lead Channel Setting Pacing Amplitude: 2.5 V
Lead Channel Setting Pacing Amplitude: 3 V
Lead Channel Setting Pacing Pulse Width: 0.4 ms
Lead Channel Setting Pacing Pulse Width: 0.4 ms
Lead Channel Setting Sensing Sensitivity: 0.8 mV
Lead Channel Setting Sensing Sensitivity: 1.6 mV
Pulse Gen Model: 429553
Pulse Gen Serial Number: 84824950

## 2020-11-25 MED ORDER — CARVEDILOL 12.5 MG PO TABS: 12.5000 mg | ORAL_TABLET | Freq: Three times a day (TID) | ORAL | 3 refills | Status: DC

## 2020-11-25 NOTE — Telephone Encounter (Signed)
Pt's medication was sent to pt's pharmacy as requested. Confirmation received.  °

## 2020-11-26 ENCOUNTER — Ambulatory Visit (INDEPENDENT_AMBULATORY_CARE_PROVIDER_SITE_OTHER): Payer: Medicare HMO

## 2020-11-26 DIAGNOSIS — I442 Atrioventricular block, complete: Secondary | ICD-10-CM

## 2020-12-10 NOTE — Progress Notes (Signed)
Remote ICD transmission.   

## 2021-01-12 ENCOUNTER — Encounter: Payer: Medicare HMO | Admitting: Internal Medicine

## 2021-01-13 ENCOUNTER — Other Ambulatory Visit: Payer: Self-pay

## 2021-01-13 ENCOUNTER — Encounter: Payer: Self-pay | Admitting: Internal Medicine

## 2021-01-13 ENCOUNTER — Ambulatory Visit: Payer: Medicare HMO | Admitting: Internal Medicine

## 2021-01-13 VITALS — BP 120/70 | HR 70 | Ht 76.0 in | Wt 244.0 lb

## 2021-01-13 DIAGNOSIS — I4811 Longstanding persistent atrial fibrillation: Secondary | ICD-10-CM

## 2021-01-13 DIAGNOSIS — I5022 Chronic systolic (congestive) heart failure: Secondary | ICD-10-CM | POA: Diagnosis not present

## 2021-01-13 NOTE — Progress Notes (Signed)
HPI Mr. Kantner returns for followup. He is a pleasant 87 yo man with a h/o persistent atrial fib, Chronic systolic heart failure, sinus node dysfunction, and underwent upgrade to a biv ICD several months ago. He feels better. He has not had syncope. His activity has improved. He denies chest pain.    Current Outpatient Medications  Medication Sig Dispense Refill  . apixaban (ELIQUIS) 2.5 MG TABS tablet Take 1 tablet (2.5 mg total) by mouth 2 (two) times daily. 180 tablet 1  . CALCIUM-MAGNESIUM-ZINC PO Take 2 tablets by mouth daily.    . carvedilol (COREG) 12.5 MG tablet Take 1 tablet (12.5 mg total) by mouth in the morning, at noon, and at bedtime. 270 tablet 3  . Cholecalciferol (VITAMIN D3) 10 MCG (400 UNIT) CAPS Take 400 Units by mouth daily.    . Cyanocobalamin (VITAMIN B-12 IJ) Inject 1 mL as directed every 30 (thirty) days.     . furosemide (LASIX) 40 MG tablet Take 40 mg by mouth daily.    [name] Kitchen gabapentin (NEURONTIN) 100 MG capsule Take 100 mg by mouth 3 (three) times daily.    [name] Kitchen glipiZIDE (GLUCOTROL XL) 10 MG 24 hr tablet Take 10 mg by mouth 2 times daily at 12 noon and 4 pm.    . hydrALAZINE (APRESOLINE) 25 MG tablet Take 25 mg by mouth 3 (three) times daily as needed (for bp levels).    . Insulin Glargine (TOUJEO SOLOSTAR) 300 UNIT/ML SOPN Inject 18 Units as directed daily.    [name] Kitchen losartan (COZAAR) 25 MG tablet Take 1 tablet (25 mg total) by mouth daily. 30 tablet 11  . Multiple Vitamins-Minerals (MULTIVITAMINS THER. W/MINERALS) TABS Take 1 tablet by mouth daily.    [name] Kitchen omeprazole (PRILOSEC) 40 MG capsule Take 1 capsule (40 mg total) by mouth daily before breakfast. 90 capsule 1  . rosuvastatin (CRESTOR) 5 MG tablet Take 5 mg by mouth every evening.     No current facility-administered medications for this visit.     Past Medical History:  Diagnosis Date  . Allergy to ACE inhibitors    Angioedema many years ago; patient has tolerated Losartan (ARB) in the past - it was held  during [facility] for GI bleed and worsening renal function >> resume Losartan 25 mg QD 06/2015  . Arthritis   . Atrial fibrillation (HCC)    PAF, CHADs2Vasc = 5  . Carotid artery disease (HCC)    60-79% LICA  . CHF (congestive heart failure) (HCC)   . CKD (chronic kidney disease)   . Coronary artery disease   . Diabetes mellitus   . GERD (gastroesophageal reflux disease)   . Hyperlipidemia   . Hypertension   . ICD (implantable cardiac defibrillator) in place 03/28/2012   Biotronik, Dr. Ladona Ridgel 03/28/12  . Ischemic cardiomyopathy    S/P CABG x 3; EF 25%  . Mitral regurgitation    S/P mitral valve repair 2013  . Myocardial infarction (HCC)   . Renal insufficiency     ROS:   All systems reviewed and negative except as noted in the HPI.   Past Surgical History:  Procedure Laterality Date  . BIV UPGRADE N/A 10/12/2020   Procedure: BIV ICD UPGRADE;  Surgeon: Marinus Maw, MD;  Location[facility] INVASIVE CV LAB;  Service: Cardiovascular;  Laterality: N/A;  . CHOLECYSTECTOMY N/A 09/20/2017   Procedure: LAPAROSCOPIC SUBTOTAL CHOLECYSTECTOMY;  Surgeon: Axel Filler, MD;  Location: [facility] OR;  Service: General;  Laterality: N/A;  . COLONOSCOPY  N/A 04/24/2014   RMR Pancolonic diverticulosis  . CORONARY ARTERY BYPASS GRAFT  10/17/2011   Procedure: CORONARY ARTERY BYPASS GRAFTING (CABG);  Surgeon: Purcell Nails, MD;  Location: [facility] OR;  Service: Open Heart Surgery;  Laterality: N/A;  . ERCP N/A 09/29/2017   Procedure: ENDOSCOPIC RETROGRADE CHOLANGIOPANCREATOGRAPHY (ERCP);  Surgeon: Jeani Hawking, MD;  Location: [facility] ENDOSCOPY;  Service: Endoscopy;  Laterality: N/A;  . ESOPHAGOGASTRODUODENOSCOPY N/A 04/24/2014   RMR Subtle nodularity the gastric mucosa of uncertain significance-status post biopsy. Hiatal hernia. chronic inflammation, no H.pylori  . ESOPHAGOGASTRODUODENOSCOPY N/A 06/24/2016   Dr. Jena Gauss: normal esophagus, small hiatal hernia, normal duodenum  . ICD  03/28/2012  . IMPLANTABLE CARDIOVERTER  DEFIBRILLATOR IMPLANT N/A 03/28/2012   Procedure: IMPLANTABLE CARDIOVERTER DEFIBRILLATOR IMPLANT;  Surgeon: Marinus Maw, MD;  Location: [facility] CATH LAB;  Service: Cardiovascular;  Laterality: N/A;  . KNEE ARTHROSCOPY  ~ 2008   right  . LEFT AND RIGHT HEART CATHETERIZATION WITH CORONARY ANGIOGRAM N/A 10/12/2011   Procedure: LEFT AND RIGHT HEART CATHETERIZATION WITH CORONARY ANGIOGRAM;  Surgeon: Iran Ouch, MD;  Location: [facility] CATH LAB;  Service: Cardiovascular;  Laterality: N/A;  . MITRAL VALVE REPAIR  10/17/2011   Procedure: MITRAL VALVE REPAIR (MVR);  Surgeon: Purcell Nails, MD;  Location: [facility] OR;  Service: Open Heart Surgery;  Laterality: N/A;  . NASAL SINUS SURGERY  1990's   right     Family History  Problem Relation Age of Onset  . Heart disease Mother   . Diabetes Father   . Cardiomyopathy Father   . Colon cancer Neg Hx      Social History   Socioeconomic History  . Marital status: Married    Spouse name: Not on file  . Number of children: Not on file  . Years of education: Not on file  . Highest education level: Not on file  Occupational History  . Occupation: Retired  Tobacco Use  . Smoking status: Never Smoker  . Smokeless tobacco: Never Used  Vaping Use  . Vaping Use: Never used  Substance and Sexual Activity  . Alcohol use: No    Alcohol/week: 0.0 standard drinks  . Drug use: No  . Sexual activity: Yes  Other Topics Concern  . Not on file  Social History Narrative  . Not on file   Social Determinants of Health   Financial Resource Strain: Not on file  Food Insecurity: Not on file  Transportation Needs: Not on file  Physical Activity: Not on file  Stress: Not on file  Social Connections: Not on file  Intimate Partner Violence: Not on file     BP 120/70 (BP Location: Left Arm, Patient Position: Sitting, Cuff Size: Normal)   Pulse 70   Ht 6\' 4"  (1.93 m)   Wt 244 lb (110.7 kg)   SpO2 95%   BMI 29.70 kg/m   Physical Exam:  Well appearing  NAD HEENT: Unremarkable Neck:  No JVD, no thyromegally Lymphatics:  No adenopathy Back:  No CVA tenderness Lungs:  Clear with no wheezes HEART:  Regular rate rhythm, no murmurs, no rubs, no clicks Abd:  soft, positive bowel sounds, no organomegally, no rebound, no guarding Ext:  2 plus pulses, no edema, no cyanosis, no clubbing Skin:  No rashes no nodules Neuro:  CN II through XII intact, motor grossly intact   DEVICE  Normal device function.  See PaceArt for details.   Assess/Plan: 1. Chronic systolic heart failure - his symptoms are class 2. He will continue  his current meds. 2. Persistent atrial fib - I discussed medical therapy to get him back to rhythm but will hold off on this. 3. ICD - his biotronik biv ICD is working normally. 4. CAD - he is s/p CABG. He will continue his current meds.  Sharlot Gowda Beldon Nowling,MD

## 2021-01-13 NOTE — Patient Instructions (Signed)
Medication Instructions:  Your physician recommends that you continue on your current medications as directed. Please refer to the Current Medication list given to you today.  *If you need a refill on your cardiac medications before your next appointment, please call your pharmacy*   Lab Work: None ordered   Testing/Procedures: None ordered   Follow-Up: At [facility], you and your health needs are our priority.  As part of our continuing mission to provide you with exceptional heart care, we have created designated Provider Care Teams.  These Care Teams include your primary Cardiologist (physician) and Advanced Practice Providers (APPs -  Physician Assistants and Nurse Practitioners) who all work together to provide you with the care you need, when you need it.  Remote monitoring is used to monitor your Pacemaker or ICD from home. This monitoring reduces the number of office visits required to check your device to one time per year. It allows [place] to keep an eye on the functioning of your device to ensure it is working properly. You are scheduled for a device check from home on 02/25/2021. You may send your transmission at any time that day. If you have a wireless device, the transmission will be sent automatically. After your physician reviews your transmission, you will receive a postcard with your next transmission date.  Your next appointment:   1 year(s)  The format for your next appointment:   In Person  Provider:   Lewayne Bunting, MD   Thank you for choosing [facility]!!

## 2021-01-20 ENCOUNTER — Telehealth: Payer: Self-pay | Admitting: Internal Medicine

## 2021-01-20 NOTE — Telephone Encounter (Signed)
Spoke with patient, he reports he has this tingling sensation approximately once a day, it doesn't hurt and can usually resolve with change in position.  Reviewed most recent trasnmission which came through last night.  CRT-D is functioning appropriately, there are no issues with device or lead so he will continue to monitor and if it worsens in any way let us, know.

## 2021-01-20 NOTE — Telephone Encounter (Signed)
Patient said he sometimes gets a tingling sensation that starts near his defibrillator. It is not a consistent sensation. It does not hurt. He is not sure if he needs to come in and be seen or not. Please advise

## 2021-02-17 ENCOUNTER — Telehealth: Payer: Self-pay | Admitting: Internal Medicine

## 2021-02-17 NOTE — Telephone Encounter (Signed)
*  STAT* If patient is at the pharmacy, call can be transferred to refill team.   1. Which medications need to be refilled? (please list name of each medication and dose if known) Testosterone cypionate   2. Which pharmacy/location (including street and city if local pharmacy) is medication to be sent to? [place]   3. Do they need a 30 day or 90 day supply? Not sure

## 2021-02-18 NOTE — Telephone Encounter (Signed)
After reviewing pt's medication list and previous office notes, Testosterone Cypionate does not appear to be on pt's medication list. Patient was instructed to contact his PCP for this medication.

## 2021-02-22 ENCOUNTER — Other Ambulatory Visit: Payer: Self-pay | Admitting: Internal Medicine

## 2021-02-25 ENCOUNTER — Ambulatory Visit (INDEPENDENT_AMBULATORY_CARE_PROVIDER_SITE_OTHER): Payer: Medicare HMO

## 2021-02-25 DIAGNOSIS — I442 Atrioventricular block, complete: Secondary | ICD-10-CM

## 2021-03-01 LAB — CUP PACEART REMOTE DEVICE CHECK
Date Time Interrogation Session: 20220630143303
Implantable Lead Implant Date: 20130731
Implantable Lead Implant Date: 20220214
Implantable Lead Implant Date: 20220214
Implantable Lead Location: 753858
Implantable Lead Location: 753859
Implantable Lead Location: 753860
Implantable Lead Model: 365
Implantable Lead Model: 377
Implantable Lead Model: 3830
Implantable Lead Serial Number: 10505793
Implantable Lead Serial Number: 8000207354
Implantable Pulse Generator Implant Date: 20220214
Pulse Gen Model: 429553
Pulse Gen Serial Number: 84824950

## 2021-03-10 ENCOUNTER — Telehealth: Payer: Self-pay

## 2021-03-10 NOTE — Telephone Encounter (Signed)
Patient retuning call to discuss reduced Bi-V pacing and increase in thoracic impedance. Patient states he feels well and has no complaints. Assessed for s/s of fluid overload. Patient weighs himself daily and records. Denies weight gain of 2-3 lbs overnight or 5 lbs. Taking all medications as prescribed including lasix 40 mg daily. Patient does state he was having some "lightheadedness" and discussed with Dr. Ladona Ridgel at last appointment. States he was instructed to decrease his coreg from TID to BID. Has been doing this for 3 weeks now. States symptoms of lightheadedness has resolved with med change. No indication of PVC burden increase. AT/AF% remains at 100% over the last 24 hours. Will forward to Dr. Ladona Ridgel for recommendations if indicated.

## 2021-03-10 NOTE — Telephone Encounter (Signed)
Biotronik alert received for "Resynchronization therapy below limit (CRT pacing < 85%) Last value 82% measured on Mar 10, 2021 1:10:02 AM" Transmission also indicated that  Thoracic impedance is elevated. Attempted to contact patient to assess for s/s of volume overload. Hipaa compliant VM message left requesting call back to [phone].

## 2021-03-17 NOTE — Progress Notes (Signed)
Remote ICD transmission.   

## 2021-03-25 ENCOUNTER — Other Ambulatory Visit: Payer: Self-pay | Admitting: Internal Medicine

## 2021-03-25 NOTE — Telephone Encounter (Signed)
Prescription refill request for Eliquis received. Indication: Afib Last office visit: 01/13/21 Ladona Ridgel Scr: 2.02 Age: 87 Weight: 110.7kg  Appropriate dose refill sent to requested pharmacy.

## 2021-05-04 ENCOUNTER — Telehealth: Payer: Self-pay | Admitting: Internal Medicine

## 2021-05-04 NOTE — Telephone Encounter (Signed)
Spoke with pt who states that he faxed patient assistance paperwork to [employer]. Office a couple of years ago. Explained to pt the need to fill out patient assistance forms yearly. Nurse will print and mail patient assistance forms to pt and have him return it to our office. Pt voiced understanding and agrees with plan of care at this time.

## 2021-05-04 NOTE — Telephone Encounter (Signed)
Returned call to patient who states Eliquis is $550 per month and he cannot afford it. He has a 30 day supply on hand. I asked if he has completed paperwork for patient assistance and he states he brought the paperwork to our office but has not heard back. I advised that I will send message to  Dr. Lubertha Basque nurse and to our patient advocate for assistance and that someone will call him back within the next few days or week. He verbalized understanding and agreement and thanked me for the call.

## 2021-05-04 NOTE — Telephone Encounter (Signed)
Pt c/o medication issue:  1. Name of Medication: ELIQUIS 2.5 MG TABS tablet  2. How are you currently taking this medication (dosage and times per day)? 1 tablet twice a day  3. Are you having a reaction (difficulty breathing--STAT)? no  4. What is your medication issue? Patient can no longer afford this medication. Would like for Dr. Ladona Ridgel to right a different prescription that doesn't cost as much. Please advise

## 2021-05-04 NOTE — Telephone Encounter (Signed)
No paper work has been given to this Engineer, civil (consulting).  Pt is seen in [place].  Will forward to triage there to determine if Pt left any paperwork there.

## 2021-05-10 ENCOUNTER — Telehealth: Payer: Self-pay | Admitting: *Deleted

## 2021-05-10 NOTE — Telephone Encounter (Signed)
Pt requesting samples of Eliquis 2.5 mg. 2 Week of samples given Lot# [id number], Exp: June 2023. Pt brought in completed [employer] patient assistance forms. Will complete and fax.

## 2021-05-14 ENCOUNTER — Telehealth: Payer: Self-pay | Admitting: *Deleted

## 2021-05-14 NOTE — Telephone Encounter (Signed)
Pt notified that he has been approved for the [employer] patient assistance foundation.

## 2021-05-27 ENCOUNTER — Ambulatory Visit (INDEPENDENT_AMBULATORY_CARE_PROVIDER_SITE_OTHER): Payer: Medicare HMO

## 2021-05-27 DIAGNOSIS — I442 Atrioventricular block, complete: Secondary | ICD-10-CM

## 2021-05-27 LAB — CUP PACEART REMOTE DEVICE CHECK
Date Time Interrogation Session: 20220929065806
Implantable Lead Implant Date: 20130731
Implantable Lead Implant Date: 20220214
Implantable Lead Implant Date: 20220214
Implantable Lead Location: 753858
Implantable Lead Location: 753859
Implantable Lead Location: 753860
Implantable Lead Model: 365
Implantable Lead Model: 377
Implantable Lead Model: 3830
Implantable Lead Serial Number: 10505793
Implantable Lead Serial Number: 8000207354
Implantable Pulse Generator Implant Date: 20220214
Pulse Gen Model: 429553
Pulse Gen Serial Number: 84824950

## 2021-06-07 NOTE — Progress Notes (Signed)
Remote ICD transmission.   

## 2021-07-08 ENCOUNTER — Telehealth: Payer: Self-pay | Admitting: *Deleted

## 2021-07-08 NOTE — Telephone Encounter (Signed)
   Pre-operative Risk Assessment    Patient Name: Wayne Gordon  DOB: July 19, 1934 MRN: [id number]      Request for Surgical Clearance   Procedure:   CAUDAL EPIDURAL STEROID INJECTION  Date of Surgery: Clearance TBD                                 Surgeon:  DR. Peggye Pitt Surgeon's Group or Practice Name:  [facility] Phone number:  [phone] Fax number:  [phone] ATTN: Avon Gully. CMA   Type of Clearance Requested: - Medical  - Pharmacy:  Hold Apixaban (Eliquis) x 3 DAYS PRIOR TO PROCEDURE   Type of Anesthesia:   Not Indicated   Additional requests/questions:   [name]   07/08/2021, 4:48 PM

## 2021-07-12 NOTE — Telephone Encounter (Signed)
Our office received duplicate clearance request. I will update the requesting office that we are awaiting on Pharm-D input. I will update pre op provider 2nd request faxed to our office today.

## 2021-07-12 NOTE — Telephone Encounter (Signed)
Patient with diagnosis of afib on Eliquis for anticoagulation.    Procedure: CAUDAL EPIDURAL STEROID INJECTION Date of procedure: TBD   CHA2DS2-VASc Score = 6   This indicates a 9.7% annual risk of stroke. The patient's score is based upon: CHF History: 1 HTN History: 1 Diabetes History: 1 Stroke History: 0 Vascular Disease History: 1 Age Score: 2 Gender Score: 0      CrCl 32 ml/min  Per office protocol, patient can hold Eliquis for 3 days prior to procedure.

## 2021-07-14 NOTE — Telephone Encounter (Signed)
   Primary Cardiologist: Lewayne Bunting, MD  Chart reviewed as part of pre-operative protocol coverage. Given past medical history and time since last visit, based on ACC/AHA guidelines, Wayne Gordon would be at acceptable risk for the planned procedure without further cardiovascular testing.   Patient with diagnosis of afib on Eliquis for anticoagulation.     Procedure: CAUDAL EPIDURAL STEROID INJECTION Date of procedure: TBD     CHA2DS2-VASc Score = 6   This indicates a 9.7% annual risk of stroke. The patient's score is based upon: CHF History: 1 HTN History: 1 Diabetes History: 1 Stroke History: 0 Vascular Disease History: 1 Age Score: 2 Gender Score: 0       CrCl 32 ml/min   Per office protocol, patient can hold Eliquis for 3 days prior to procedure.  Patient was advised that if he develops new symptoms prior to surgery to contact our office to arrange a follow-up appointment.  He verbalized understanding.  I will route this recommendation to the requesting party via Epic fax function and remove from pre-op pool.  Please call with questions.  Thomasene Ripple. Angelly Spearing NP-C    07/14/2021, 7:51 AM [facility] [address] Office [phone] Fax [phone]

## 2021-08-19 LAB — CUP PACEART REMOTE DEVICE CHECK
Date Time Interrogation Session: 20221222081704
Implantable Lead Implant Date: 20130731
Implantable Lead Implant Date: 20220214
Implantable Lead Implant Date: 20220214
Implantable Lead Location: 753858
Implantable Lead Location: 753859
Implantable Lead Location: 753860
Implantable Lead Model: 365
Implantable Lead Model: 377
Implantable Lead Model: 3830
Implantable Lead Serial Number: 10505793
Implantable Lead Serial Number: 8000207354
Implantable Pulse Generator Implant Date: 20220214
Pulse Gen Model: 429553
Pulse Gen Serial Number: 84824950

## 2021-08-26 ENCOUNTER — Ambulatory Visit (INDEPENDENT_AMBULATORY_CARE_PROVIDER_SITE_OTHER): Payer: Medicare HMO

## 2021-08-26 DIAGNOSIS — I442 Atrioventricular block, complete: Secondary | ICD-10-CM

## 2021-09-07 NOTE — Progress Notes (Signed)
Remote ICD transmission.   

## 2021-10-14 ENCOUNTER — Emergency Department (HOSPITAL_COMMUNITY): Payer: Medicare HMO

## 2021-10-14 ENCOUNTER — Other Ambulatory Visit: Payer: Self-pay

## 2021-10-14 ENCOUNTER — Inpatient Hospital Stay (HOSPITAL_COMMUNITY)
Admission: EM | Admit: 2021-10-14 | Discharge: 2021-10-18 | DRG: 291 | Disposition: A | Discharge status: Home / Self Care | Payer: Medicare HMO | Attending: Internal Medicine | Admitting: Internal Medicine

## 2021-10-14 DIAGNOSIS — E119 Type 2 diabetes mellitus without complications: Secondary | ICD-10-CM | POA: Diagnosis not present

## 2021-10-14 DIAGNOSIS — I130 Hypertensive heart and chronic kidney disease with heart failure and stage 1 through stage 4 chronic kidney disease, or unspecified chronic kidney disease: Principal | ICD-10-CM | POA: Diagnosis present

## 2021-10-14 DIAGNOSIS — B9781 Human metapneumovirus as the cause of diseases classified elsewhere: Secondary | ICD-10-CM | POA: Diagnosis present

## 2021-10-14 DIAGNOSIS — I4821 Permanent atrial fibrillation: Secondary | ICD-10-CM | POA: Diagnosis present

## 2021-10-14 DIAGNOSIS — N1832 Chronic kidney disease, stage 3b: Secondary | ICD-10-CM | POA: Diagnosis present

## 2021-10-14 DIAGNOSIS — I4891 Unspecified atrial fibrillation: Secondary | ICD-10-CM | POA: Diagnosis present

## 2021-10-14 DIAGNOSIS — E1122 Type 2 diabetes mellitus with diabetic chronic kidney disease: Secondary | ICD-10-CM | POA: Diagnosis present

## 2021-10-14 DIAGNOSIS — I480 Paroxysmal atrial fibrillation: Secondary | ICD-10-CM | POA: Diagnosis not present

## 2021-10-14 DIAGNOSIS — I252 Old myocardial infarction: Secondary | ICD-10-CM

## 2021-10-14 DIAGNOSIS — Z833 Family history of diabetes mellitus: Secondary | ICD-10-CM

## 2021-10-14 DIAGNOSIS — I4510 Unspecified right bundle-branch block: Secondary | ICD-10-CM | POA: Diagnosis present

## 2021-10-14 DIAGNOSIS — I255 Ischemic cardiomyopathy: Secondary | ICD-10-CM | POA: Diagnosis present

## 2021-10-14 DIAGNOSIS — Z7901 Long term (current) use of anticoagulants: Secondary | ICD-10-CM | POA: Diagnosis not present

## 2021-10-14 DIAGNOSIS — E1165 Type 2 diabetes mellitus with hyperglycemia: Secondary | ICD-10-CM | POA: Diagnosis present

## 2021-10-14 DIAGNOSIS — E785 Hyperlipidemia, unspecified: Secondary | ICD-10-CM | POA: Diagnosis present

## 2021-10-14 DIAGNOSIS — Z95810 Presence of automatic (implantable) cardiac defibrillator: Secondary | ICD-10-CM | POA: Diagnosis present

## 2021-10-14 DIAGNOSIS — I495 Sick sinus syndrome: Secondary | ICD-10-CM | POA: Diagnosis present

## 2021-10-14 DIAGNOSIS — K219 Gastro-esophageal reflux disease without esophagitis: Secondary | ICD-10-CM | POA: Diagnosis present

## 2021-10-14 DIAGNOSIS — J123 Human metapneumovirus pneumonia: Secondary | ICD-10-CM | POA: Diagnosis not present

## 2021-10-14 DIAGNOSIS — J069 Acute upper respiratory infection, unspecified: Secondary | ICD-10-CM | POA: Diagnosis present

## 2021-10-14 DIAGNOSIS — I2510 Atherosclerotic heart disease of native coronary artery without angina pectoris: Secondary | ICD-10-CM | POA: Diagnosis present

## 2021-10-14 DIAGNOSIS — Z79899 Other long term (current) drug therapy: Secondary | ICD-10-CM

## 2021-10-14 DIAGNOSIS — I25810 Atherosclerosis of coronary artery bypass graft(s) without angina pectoris: Secondary | ICD-10-CM | POA: Diagnosis present

## 2021-10-14 DIAGNOSIS — Z20822 Contact with and (suspected) exposure to covid-19: Secondary | ICD-10-CM | POA: Diagnosis present

## 2021-10-14 DIAGNOSIS — E7800 Pure hypercholesterolemia, unspecified: Secondary | ICD-10-CM | POA: Diagnosis not present

## 2021-10-14 DIAGNOSIS — I472 Ventricular tachycardia, unspecified: Secondary | ICD-10-CM | POA: Diagnosis present

## 2021-10-14 DIAGNOSIS — I2722 Pulmonary hypertension due to left heart disease: Secondary | ICD-10-CM | POA: Diagnosis present

## 2021-10-14 DIAGNOSIS — J9601 Acute respiratory failure with hypoxia: Secondary | ICD-10-CM | POA: Diagnosis not present

## 2021-10-14 DIAGNOSIS — I5043 Acute on chronic combined systolic (congestive) and diastolic (congestive) heart failure: Secondary | ICD-10-CM | POA: Diagnosis not present

## 2021-10-14 DIAGNOSIS — Z20828 Contact with and (suspected) exposure to other viral communicable diseases: Secondary | ICD-10-CM | POA: Diagnosis present

## 2021-10-14 DIAGNOSIS — Z888 Allergy status to other drugs, medicaments and biological substances status: Secondary | ICD-10-CM

## 2021-10-14 DIAGNOSIS — N183 Chronic kidney disease, stage 3 unspecified: Secondary | ICD-10-CM | POA: Diagnosis not present

## 2021-10-14 DIAGNOSIS — Z880 Allergy status to penicillin: Secondary | ICD-10-CM

## 2021-10-14 DIAGNOSIS — Z794 Long term (current) use of insulin: Secondary | ICD-10-CM

## 2021-10-14 DIAGNOSIS — I10 Essential (primary) hypertension: Secondary | ICD-10-CM | POA: Diagnosis not present

## 2021-10-14 DIAGNOSIS — M1990 Unspecified osteoarthritis, unspecified site: Secondary | ICD-10-CM | POA: Diagnosis present

## 2021-10-14 DIAGNOSIS — Z8249 Family history of ischemic heart disease and other diseases of the circulatory system: Secondary | ICD-10-CM

## 2021-10-14 DIAGNOSIS — Z9049 Acquired absence of other specified parts of digestive tract: Secondary | ICD-10-CM

## 2021-10-14 DIAGNOSIS — Z7984 Long term (current) use of oral hypoglycemic drugs: Secondary | ICD-10-CM

## 2021-10-14 DIAGNOSIS — I5023 Acute on chronic systolic (congestive) heart failure: Secondary | ICD-10-CM | POA: Diagnosis present

## 2021-10-14 DIAGNOSIS — I509 Heart failure, unspecified: Secondary | ICD-10-CM

## 2021-10-14 DIAGNOSIS — I25700 Atherosclerosis of coronary artery bypass graft(s), unspecified, with unstable angina pectoris: Secondary | ICD-10-CM | POA: Diagnosis not present

## 2021-10-14 DIAGNOSIS — I5021 Acute systolic (congestive) heart failure: Secondary | ICD-10-CM | POA: Diagnosis not present

## 2021-10-14 DIAGNOSIS — Z8679 Personal history of other diseases of the circulatory system: Secondary | ICD-10-CM

## 2021-10-14 LAB — BASIC METABOLIC PANEL
Anion gap: 11 (ref 5–15)
BUN: 22 mg/dL (ref 8–23)
CO2: 22 mmol/L (ref 22–32)
Calcium: 8.8 mg/dL — ABNORMAL LOW (ref 8.9–10.3)
Chloride: 95 mmol/L — ABNORMAL LOW (ref 98–111)
Creatinine, Ser: 1.72 mg/dL — ABNORMAL HIGH (ref 0.61–1.24)
GFR, Estimated: 38 mL/min — ABNORMAL LOW (ref >60)
Glucose, Bld: 151 mg/dL — ABNORMAL HIGH (ref 70–99)
Potassium: 3.9 mmol/L (ref 3.5–5.1)
Sodium: 128 mmol/L — ABNORMAL LOW (ref 135–145)

## 2021-10-14 LAB — CBC
HCT: 44.9 % (ref 39.0–52.0)
Hemoglobin: 14.4 g/dL (ref 13.0–17.0)
MCH: 30.2 pg (ref 26.0–34.0)
MCHC: 32.1 g/dL (ref 30.0–36.0)
MCV: 94.1 fL (ref 80.0–100.0)
Platelets: 150 10*3/uL (ref 150–400)
RBC: 4.77 MIL/uL (ref 4.22–5.81)
RDW: 14.2 % (ref 11.5–15.5)
WBC: 7 10*3/uL (ref 4.0–10.5)
nRBC: 0 % (ref 0.0–0.2)

## 2021-10-14 LAB — BRAIN NATRIURETIC PEPTIDE: B Natriuretic Peptide: 511 pg/mL — ABNORMAL HIGH (ref 0.0–100.0)

## 2021-10-14 LAB — MAGNESIUM: Magnesium: 1.8 mg/dL (ref 1.7–2.4)

## 2021-10-14 MED ORDER — POTASSIUM CHLORIDE CRYS ER 20 MEQ PO TBCR
20.0000 meq | EXTENDED_RELEASE_TABLET | Freq: Once | ORAL | Status: AC
  Administered 2021-10-14: 20 meq via ORAL
  Filled 2021-10-14: qty 1

## 2021-10-14 MED ORDER — FUROSEMIDE 10 MG/ML IJ SOLN
40.0000 mg | Freq: Once | INTRAMUSCULAR | Status: AC
  Administered 2021-10-14: 40 mg via INTRAVENOUS
  Filled 2021-10-14: qty 4

## 2021-10-14 NOTE — ED Triage Notes (Signed)
Pt BIB [facility] with reports of SHOB. Pt received 5mg  albuterol PTA. Pt denies pain.

## 2021-10-14 NOTE — ED Notes (Signed)
Pt 87% on RA. Pt placed on 2L [place]. O2 95% at this time

## 2021-10-14 NOTE — ED Provider Notes (Signed)
[facility] EMERGENCY DEPARTMENT Provider Note   CSN: [id number] Arrival date & time: 10/14/21  2205     History  Chief Complaint  Patient presents with   Shortness of Breath    Wayne Gordon is a 88 y.o. male.  He has a history of A-fib on anticoagulation, coronary disease, CHF with an AICD in place.  He is complaining of a few days of increased shortness of breath dyspnea on exertion.  Cough minimally productive of white sputum.  No fevers or chills.  No chest pain or abdominal pain.  He is also had increase swelling on his legs.  He said he is up 5 pounds over the last few days, try to increase his oral diuretic without any improvement.  The history is provided by the patient.  Shortness of Breath Severity:  Moderate Onset quality:  Gradual Duration:  4 days Timing:  Constant Progression:  Worsening Context: activity   Relieved by:  Nothing Worsened by:  Activity and coughing Ineffective treatments:  Diuretics Associated symptoms: cough and wheezing   Associated symptoms: no abdominal pain, no chest pain, no fever, no headaches, no hemoptysis, no rash, no sore throat and no sputum production   Risk factors: no tobacco use       Home Medications Prior to Admission medications   Medication Sig Start Date End Date Taking? Authorizing Provider  CALCIUM-MAGNESIUM-ZINC PO Take 2 tablets by mouth daily.    [provider]  carvedilol (COREG) 12.5 MG tablet Take 1 tablet (12.5 mg total) by mouth in the morning, at noon, and at bedtime. 11/25/20   Evans Lance, MD  Cholecalciferol (VITAMIN D3) 10 MCG (400 UNIT) CAPS Take 400 Units by mouth daily.    [provider]  Cyanocobalamin (VITAMIN B-12 IJ) Inject 1 mL as directed every 30 (thirty) days.     [provider]  ELIQUIS 2.5 MG TABS tablet TAKE (1) TABLET TWICE A DAY. 03/25/21   Evans Lance, MD  furosemide (LASIX) 40 MG tablet Take 40 mg by mouth daily. 03/09/20   [provider]   gabapentin (NEURONTIN) 100 MG capsule Take 100 mg by mouth 3 (three) times daily.    [provider]  glipiZIDE (GLUCOTROL XL) 10 MG 24 hr tablet Take 10 mg by mouth 2 times daily at 12 noon and 4 pm.    [provider]  hydrALAZINE (APRESOLINE) 25 MG tablet Take 25 mg by mouth 3 (three) times daily as needed (for bp levels).    [provider]  Insulin Glargine (TOUJEO SOLOSTAR) 300 UNIT/ML SOPN Inject 18 Units as directed daily. 04/27/15   [provider]  losartan (COZAAR) 25 MG tablet Take 1 tablet (25 mg total) by mouth daily. 07/11/15   Richardson Dopp T, PA-C  Multiple Vitamins-Minerals (MULTIVITAMINS THER. W/MINERALS) TABS Take 1 tablet by mouth daily.    [provider]  omeprazole (PRILOSEC) 40 MG capsule Take 1 capsule (40 mg total) by mouth daily before breakfast. 02/27/20   Erenest Rasher, PA-C  rosuvastatin (CRESTOR) 5 MG tablet Take 5 mg by mouth every evening.    [provider]      Allergies    Ace inhibitors and Penicillins    Review of Systems   Review of Systems  Constitutional:  Negative for fever.  HENT:  Negative for sore throat.   Eyes:  Negative for visual disturbance.  Respiratory:  Positive for cough, shortness of breath and wheezing. Negative for hemoptysis and  sputum production.   Cardiovascular:  Positive for leg swelling. Negative for chest pain.  Gastrointestinal:  Negative for abdominal pain.  Genitourinary:  Negative for dysuria.  Musculoskeletal:  Negative for joint swelling.  Skin:  Negative for rash.  Neurological:  Negative for headaches.   Physical Exam Updated Vital Signs BP 127/71 (BP Location: Left Arm)    Pulse 70    Temp 98.7 F (37.1 C) (Oral)    Resp 18    Ht 6\' 4"  (1.93 m)    Wt 110 kg    SpO2 98%    BMI 29.52 kg/m  Physical Exam Vitals and nursing note reviewed.  Constitutional:      General: He is not in acute distress.    Appearance: He is well-developed.  HENT:     Head:  Normocephalic and atraumatic.  Eyes:     Conjunctiva/sclera: Conjunctivae normal.  Cardiovascular:     Rate and Rhythm: Normal rate and regular rhythm.     Heart sounds: No murmur heard. Pulmonary:     Effort: Tachypnea and accessory muscle usage present. No respiratory distress.     Breath sounds: Wheezing present.  Abdominal:     Palpations: Abdomen is soft.     Tenderness: There is no abdominal tenderness.  Musculoskeletal:        General: No swelling.     Cervical back: Neck supple.     Right lower leg: Edema present.     Left lower leg: Edema present.  Skin:    General: Skin is warm and dry.     Capillary Refill: Capillary refill takes less than 2 seconds.  Neurological:     General: No focal deficit present.     Mental Status: He is alert.    ED Results / Procedures / Treatments   Labs (all labs ordered are listed, but only abnormal results are displayed) Labs Reviewed  BASIC METABOLIC PANEL - Abnormal; Notable for the following components:      Result Value   Sodium 128 (*)    Chloride 95 (*)    Glucose, Bld 151 (*)    Creatinine, Ser 1.72 (*)    Calcium 8.8 (*)    GFR, Estimated 38 (*)    All other components within normal limits  BRAIN NATRIURETIC PEPTIDE - Abnormal; Notable for the following components:   B Natriuretic Peptide 511.0 (*)    All other components within normal limits  HEMOGLOBIN A1C - Abnormal; Notable for the following components:   Hgb A1c MFr Bld 7.5 (*)    All other components within normal limits  COMPREHENSIVE METABOLIC PANEL - Abnormal; Notable for the following components:   Sodium 134 (*)    Chloride 97 (*)    Creatinine, Ser 1.70 (*)    Calcium 8.7 (*)    Total Protein 5.9 (*)    GFR, Estimated 39 (*)    All other components within normal limits  CBC WITH DIFFERENTIAL/PLATELET - Abnormal; Notable for the following components:   Platelets 131 (*)    All other components within normal limits  RESP PANEL BY RT-PCR (FLU A&B, COVID)  ARPGX2  MAGNESIUM  CBC  MAGNESIUM  TSH  GLUCOSE, CAPILLARY    EKG EKG Interpretation  Date/Time:  Thursday October 14 2021 22:13:12 EST Ventricular Rate:  70 PR Interval:    QRS Duration: 147 QT Interval:  355 QTC Calculation: 383 R Axis:   -8 Text Interpretation: Accelerated junctional rhythm Right bundle branch block Inferior infarct, old Probable  anterolateral infarct, age indeterm Confirmed by Aletta Edouard [id number]) on 10/14/2021 10:18:15 PM  Radiology DG CHEST PORT 1 VIEW  Result Date: 10/15/2021 CLINICAL DATA:  Acute respiratory failure with hypoxia. EXAM: PORTABLE CHEST 1 VIEW COMPARISON:  October 14, 2021. FINDINGS: Stable cardiomegaly. Status post coronary bypass graft. Left-sided defibrillator is unchanged in position. Stable elevated right hemidiaphragm is noted with minimal right basilar subsegmental atelectasis. Left lung base is not included in field-of-view. Bony thorax is unremarkable. IMPRESSION: Stable elevated right hemidiaphragm with minimal right basilar subsegmental atelectasis. Left lung base is not included in field-of-view. Electronically Signed   By: Marijo Conception M.D.   On: 10/15/2021 06:41   DG Chest Portable 1 View  Result Date: 10/14/2021 CLINICAL DATA:  Shortness of breath EXAM: PORTABLE CHEST 1 VIEW COMPARISON:  10/12/2020 FINDINGS: Eventration of the right hemidiaphragm. Lungs are essentially clear. No pleural effusion or pneumothorax. Cardiomegaly. Left subclavian ICD. Postsurgical changes related to prior CABG. Median sternotomy. IMPRESSION: No evidence of acute cardiopulmonary disease. Electronically Signed   By: Julian Hy M.D.   On: 10/14/2021 22:42    Procedures Procedures    Medications Ordered in ED Medications  furosemide (LASIX) injection 40 mg (has no administration in time range)  potassium chloride SA (KLOR-CON M) CR tablet 20 mEq (has no administration in time range)    ED Course/ Medical Decision Making/ A&P Clinical  Course as of 10/15/21 1109  Thu Oct 14, 2021  2310 X-ray interpreted by me as cardiomegaly, strange air bubble question under right diaphragm.  Radiology read the film as no acute findings. [MB]    Clinical Course User Index [MB] Hayden Rasmussen, MD                           Medical Decision Making Amount and/or Complexity of Data Reviewed Labs: ordered. Radiology: ordered.  Risk Prescription drug management. Decision regarding hospitalization.  Wayne Gordon was evaluated in Emergency Department on 10/14/2021 for the symptoms described in the history of present illness. He was evaluated in the context of the global COVID-19 pandemic, which necessitated consideration that the patient might be at risk for infection with the SARS-CoV-2 virus that causes COVID-19. Institutional protocols and algorithms that pertain to the evaluation of patients at risk for COVID-19 are in a state of rapid change based on information released by regulatory bodies including the CDC and federal and state organizations. These policies and algorithms were followed during the patient's care in the ED. This patient complains of increased shortness of breath dyspnea on exertion leg swelling; this involves an extensive number of treatment Options and is a complaint that carries with it a high risk of complications and morbidity. The differential includes CHF, COPD, pneumonia, COVID, flu, anemia, renal failure  I ordered, reviewed and interpreted labs, which included CBC with normal white count normal hemoglobin, chemistries with low sodium, elevated creatinine, creatinine has been worse in the past I ordered medication IV Lasix and oral potassium for fluid overload and reviewed PMP when indicated. I ordered imaging studies which included chest x-ray and I independently    visualized and interpreted imaging which showed cardiomegaly no clear infiltrate  Previous records obtained and reviewed in epic including prior  cardiology notes I consulted Dr. Josph Macho and discussed lab and imaging findings and discussed disposition.  Cardiac monitoring reviewed, atrial fibrillation with controlled rate  Critical Interventions: None  After the interventions stated above, I reevaluated the patient and  found patient to be reasonably comfortable although requiring oxygen.  This is a new hypoxia.  Admission and further testing considered, patient will need to be admitted to due to new hypoxia in the setting of CHF exacerbation.  Patient in agreement with plan.          Final Clinical Impression(s) / ED Diagnoses Final diagnoses:  Acute respiratory failure with hypoxia ([place])  Acute on chronic heart failure, unspecified heart failure type [facility])    Rx / DC Orders ED Discharge Orders     None         Hayden Rasmussen, MD 10/15/21 1116

## 2021-10-15 ENCOUNTER — Encounter (HOSPITAL_COMMUNITY): Payer: Self-pay | Admitting: Family Medicine

## 2021-10-15 ENCOUNTER — Inpatient Hospital Stay (HOSPITAL_COMMUNITY): Payer: Medicare HMO

## 2021-10-15 DIAGNOSIS — Z794 Long term (current) use of insulin: Secondary | ICD-10-CM

## 2021-10-15 DIAGNOSIS — N183 Chronic kidney disease, stage 3 unspecified: Secondary | ICD-10-CM

## 2021-10-15 DIAGNOSIS — J9601 Acute respiratory failure with hypoxia: Secondary | ICD-10-CM

## 2021-10-15 DIAGNOSIS — Z95810 Presence of automatic (implantable) cardiac defibrillator: Secondary | ICD-10-CM | POA: Diagnosis present

## 2021-10-15 DIAGNOSIS — I5021 Acute systolic (congestive) heart failure: Secondary | ICD-10-CM

## 2021-10-15 DIAGNOSIS — E785 Hyperlipidemia, unspecified: Secondary | ICD-10-CM

## 2021-10-15 DIAGNOSIS — E119 Type 2 diabetes mellitus without complications: Secondary | ICD-10-CM

## 2021-10-15 DIAGNOSIS — I5023 Acute on chronic systolic (congestive) heart failure: Secondary | ICD-10-CM | POA: Diagnosis not present

## 2021-10-15 DIAGNOSIS — I10 Essential (primary) hypertension: Secondary | ICD-10-CM | POA: Diagnosis present

## 2021-10-15 DIAGNOSIS — I509 Heart failure, unspecified: Secondary | ICD-10-CM

## 2021-10-15 DIAGNOSIS — I5043 Acute on chronic combined systolic (congestive) and diastolic (congestive) heart failure: Secondary | ICD-10-CM

## 2021-10-15 DIAGNOSIS — I25810 Atherosclerosis of coronary artery bypass graft(s) without angina pectoris: Secondary | ICD-10-CM | POA: Diagnosis present

## 2021-10-15 DIAGNOSIS — I4821 Permanent atrial fibrillation: Secondary | ICD-10-CM

## 2021-10-15 DIAGNOSIS — I480 Paroxysmal atrial fibrillation: Secondary | ICD-10-CM

## 2021-10-15 DIAGNOSIS — N1832 Chronic kidney disease, stage 3b: Secondary | ICD-10-CM | POA: Diagnosis present

## 2021-10-15 LAB — ECHOCARDIOGRAM COMPLETE
AR max vel: 2.68 cm2
AV Area VTI: 2.72 cm2
AV Area mean vel: 2.4 cm2
AV Mean grad: 2 mmHg
AV Peak grad: 4.8 mmHg
Ao pk vel: 1.09 m/s
Area-P 1/2: 3.31 cm2
Height: 76 in
MV VTI: 1.76 cm2
S' Lateral: 5.6 cm
Weight: 3880.1 oz

## 2021-10-15 LAB — RESP PANEL BY RT-PCR (FLU A&B, COVID) ARPGX2
Influenza A by PCR: NEGATIVE
Influenza B by PCR: NEGATIVE
SARS Coronavirus 2 by RT PCR: NEGATIVE

## 2021-10-15 LAB — GLUCOSE, CAPILLARY
Glucose-Capillary: 75 mg/dL (ref 70–99)
Glucose-Capillary: 117 mg/dL — ABNORMAL HIGH (ref 70–99)
Glucose-Capillary: 133 mg/dL — ABNORMAL HIGH (ref 70–99)
Glucose-Capillary: 137 mg/dL — ABNORMAL HIGH (ref 70–99)

## 2021-10-15 LAB — COMPREHENSIVE METABOLIC PANEL
ALT: 26 U/L (ref 0–44)
AST: 34 U/L (ref 15–41)
Albumin: 3.5 g/dL (ref 3.5–5.0)
Alkaline Phosphatase: 62 U/L (ref 38–126)
Anion gap: 12 (ref 5–15)
BUN: 22 mg/dL (ref 8–23)
CO2: 25 mmol/L (ref 22–32)
Calcium: 8.7 mg/dL — ABNORMAL LOW (ref 8.9–10.3)
Chloride: 97 mmol/L — ABNORMAL LOW (ref 98–111)
Creatinine, Ser: 1.7 mg/dL — ABNORMAL HIGH (ref 0.61–1.24)
GFR, Estimated: 39 mL/min — ABNORMAL LOW (ref >60)
Glucose, Bld: 94 mg/dL (ref 70–99)
Potassium: 3.6 mmol/L (ref 3.5–5.1)
Sodium: 134 mmol/L — ABNORMAL LOW (ref 135–145)
Total Bilirubin: 0.8 mg/dL (ref 0.3–1.2)
Total Protein: 5.9 g/dL — ABNORMAL LOW (ref 6.5–8.1)

## 2021-10-15 LAB — CBC WITH DIFFERENTIAL/PLATELET
Abs Immature Granulocytes: 0.03 10*3/uL (ref 0.00–0.07)
Basophils Absolute: 0 10*3/uL (ref 0.0–0.1)
Basophils Relative: 0 %
Eosinophils Absolute: 0 10*3/uL (ref 0.0–0.5)
Eosinophils Relative: 0 %
HCT: 40.8 % (ref 39.0–52.0)
Hemoglobin: 13.8 g/dL (ref 13.0–17.0)
Immature Granulocytes: 1 %
Lymphocytes Relative: 22 %
Lymphs Abs: 1.2 10*3/uL (ref 0.7–4.0)
MCH: 31.9 pg (ref 26.0–34.0)
MCHC: 33.8 g/dL (ref 30.0–36.0)
MCV: 94.4 fL (ref 80.0–100.0)
Monocytes Absolute: 0.4 10*3/uL (ref 0.1–1.0)
Monocytes Relative: 8 %
Neutro Abs: 3.7 10*3/uL (ref 1.7–7.7)
Neutrophils Relative %: 69 %
Platelets: 131 10*3/uL — ABNORMAL LOW (ref 150–400)
RBC: 4.32 MIL/uL (ref 4.22–5.81)
RDW: 14.2 % (ref 11.5–15.5)
WBC: 5.3 10*3/uL (ref 4.0–10.5)
nRBC: 0 % (ref 0.0–0.2)

## 2021-10-15 LAB — HEMOGLOBIN A1C
Hgb A1c MFr Bld: 7.5 % — ABNORMAL HIGH (ref 4.8–5.6)
Mean Plasma Glucose: 168.55 mg/dL

## 2021-10-15 LAB — TSH: TSH: 1.817 u[IU]/mL (ref 0.350–4.500)

## 2021-10-15 LAB — MAGNESIUM: Magnesium: 1.8 mg/dL (ref 1.7–2.4)

## 2021-10-15 MED ORDER — MELATONIN 3 MG PO TABS
6.0000 mg | ORAL_TABLET | Freq: Every day | ORAL | Status: DC
  Administered 2021-10-15 – 2021-10-17 (×4): 6 mg via ORAL
  Filled 2021-10-15 (×4): qty 2

## 2021-10-15 MED ORDER — ONDANSETRON HCL 4 MG PO TABS: 4.0000 mg | ORAL_TABLET | Freq: Four times a day (QID) | ORAL | Status: DC | PRN

## 2021-10-15 MED ORDER — PANTOPRAZOLE SODIUM 40 MG PO TBEC
40.0000 mg | DELAYED_RELEASE_TABLET | Freq: Every day | ORAL | Status: DC
Start: 2021-10-15 — End: 2021-10-18
  Administered 2021-10-15 – 2021-10-18 (×4): 40 mg via ORAL
  Filled 2021-10-15 (×4): qty 1

## 2021-10-15 MED ORDER — ACETAMINOPHEN 650 MG RE SUPP: 650.0000 mg | Freq: Four times a day (QID) | RECTAL | Status: DC | PRN

## 2021-10-15 MED ORDER — GABAPENTIN 100 MG PO CAPS
100.0000 mg | ORAL_CAPSULE | Freq: Three times a day (TID) | ORAL | Status: DC
  Administered 2021-10-15 – 2021-10-18 (×6): 100 mg via ORAL
  Filled 2021-10-15 (×9): qty 1

## 2021-10-15 MED ORDER — MORPHINE SULFATE (PF) 2 MG/ML IV SOLN: 2.0000 mg | INTRAVENOUS | Status: DC | PRN

## 2021-10-15 MED ORDER — ONDANSETRON HCL 4 MG/2ML IJ SOLN
4.0000 mg | Freq: Four times a day (QID) | INTRAMUSCULAR | Status: DC | PRN
  Administered 2021-10-16: 4 mg via INTRAVENOUS
  Filled 2021-10-15: qty 2

## 2021-10-15 MED ORDER — ACETAMINOPHEN 325 MG PO TABS
650.0000 mg | ORAL_TABLET | Freq: Four times a day (QID) | ORAL | Status: DC | PRN
  Administered 2021-10-15 – 2021-10-17 (×6): 650 mg via ORAL
  Filled 2021-10-15 (×6): qty 2

## 2021-10-15 MED ORDER — LOSARTAN POTASSIUM 50 MG PO TABS
25.0000 mg | ORAL_TABLET | Freq: Every day | ORAL | Status: DC
  Administered 2021-10-15: 25 mg via ORAL
  Filled 2021-10-15 (×3): qty 1

## 2021-10-15 MED ORDER — CARVEDILOL 12.5 MG PO TABS
12.5000 mg | ORAL_TABLET | Freq: Two times a day (BID) | ORAL | Status: DC
  Administered 2021-10-15 – 2021-10-18 (×7): 12.5 mg via ORAL
  Filled 2021-10-15 (×7): qty 1

## 2021-10-15 MED ORDER — FUROSEMIDE 10 MG/ML IJ SOLN
60.0000 mg | Freq: Two times a day (BID) | INTRAMUSCULAR | Status: DC
  Administered 2021-10-15: 60 mg via INTRAVENOUS
  Filled 2021-10-15: qty 6

## 2021-10-15 MED ORDER — FUROSEMIDE 10 MG/ML IJ SOLN
60.0000 mg | Freq: Two times a day (BID) | INTRAMUSCULAR | Status: DC
  Administered 2021-10-15 – 2021-10-17 (×4): 60 mg via INTRAVENOUS
  Filled 2021-10-15 (×4): qty 6

## 2021-10-15 MED ORDER — ROSUVASTATIN CALCIUM 10 MG PO TABS
5.0000 mg | ORAL_TABLET | Freq: Every evening | ORAL | Status: DC
  Administered 2021-10-15 – 2021-10-16 (×2): 5 mg via ORAL
  Filled 2021-10-15 (×3): qty 1

## 2021-10-15 MED ORDER — OXYCODONE HCL 5 MG PO TABS: 5.0000 mg | ORAL_TABLET | ORAL | Status: DC | PRN

## 2021-10-15 MED ORDER — INSULIN DETEMIR 100 UNIT/ML ~~LOC~~ SOLN
10.0000 [IU] | Freq: Every day | SUBCUTANEOUS | Status: DC
  Administered 2021-10-15 – 2021-10-16 (×2): 10 [IU] via SUBCUTANEOUS
  Filled 2021-10-15 (×4): qty 0.1

## 2021-10-15 MED ORDER — ALBUTEROL SULFATE (2.5 MG/3ML) 0.083% IN NEBU: 2.5000 mg | INHALATION_SOLUTION | Freq: Four times a day (QID) | RESPIRATORY_TRACT | Status: DC | PRN

## 2021-10-15 MED ORDER — INSULIN ASPART 100 UNIT/ML IJ SOLN: 0.0000 [IU] | Freq: Every day | INTRAMUSCULAR | Status: DC

## 2021-10-15 MED ORDER — HYDRALAZINE HCL 25 MG PO TABS: 25.0000 mg | ORAL_TABLET | Freq: Three times a day (TID) | ORAL | Status: DC | PRN

## 2021-10-15 MED ORDER — APIXABAN 2.5 MG PO TABS
2.5000 mg | ORAL_TABLET | Freq: Two times a day (BID) | ORAL | Status: DC
  Administered 2021-10-15 – 2021-10-18 (×7): 2.5 mg via ORAL
  Filled 2021-10-15 (×7): qty 1

## 2021-10-15 MED ORDER — FUROSEMIDE 10 MG/ML IJ SOLN: 40.0000 mg | Freq: Two times a day (BID) | INTRAMUSCULAR | Status: DC

## 2021-10-15 MED ORDER — PERFLUTREN LIPID MICROSPHERE
1.0000 mL | INTRAVENOUS | Status: AC | PRN
  Administered 2021-10-15: 6 mL via INTRAVENOUS
  Filled 2021-10-15: qty 10

## 2021-10-15 MED ORDER — INSULIN ASPART 100 UNIT/ML IJ SOLN
0.0000 [IU] | Freq: Three times a day (TID) | INTRAMUSCULAR | Status: DC
  Administered 2021-10-15: 2 [IU] via SUBCUTANEOUS

## 2021-10-15 NOTE — Plan of Care (Signed)
  Problem: Education: Goal: Ability to demonstrate management of disease process will improve Outcome: Progressing Goal: Ability to verbalize understanding of medication therapies will improve Outcome: Progressing   

## 2021-10-15 NOTE — Progress Notes (Signed)
*  PRELIMINARY RESULTS* Echocardiogram 2D Echocardiogram has been performed.  Carolyne Fiscal 10/15/2021, 10:57 AM

## 2021-10-15 NOTE — Progress Notes (Signed)
PROGRESS NOTE    Wayne Gordon  [id number] DOB: September 24, 1933 DOA: 10/14/2021 PCP: Adaline Sill, NP     Brief Narrative:  88 y.o. WM PMHx Atrial fibrillation, CAD, Ischemic cardiomyopathy (EF 25%) ICD placement Bi[employer]ronik, Dr. Lovena Le 03/28/12, HTN, HLD, CKD stage IIIb (baseline Cr1.7-2.0), DM type II hold with hyperglycemia, GERD,   Presents to ED with chief complaint of dyspnea.  Patient reports he has dyspnea that started 2 days ago and has been getting worse since it started.  He has associated cough productive of clear sputum, and wheeze.  Patient reports orthopnea and bilateral lower extremity edema as well.  Patient had a 2 pound weight gain on Wednesday, and a 3 pound weight gain on Thursday.  He takes 40 mg of Lasix daily, but increased his Lasix to 100 mg on the day of presentation.  He did not have an increase in urine output.  He reports in fact he has noticed a decrease in urine output from his baseline.  Patient reports no history of COPD, so he has no inhaler use.  That he could attempt to use for his wheezing. Patient denies any fevers, chest pain, palpitations.  Patient denies any rashes, myalgias, diarrhea, constipation, dysuria, hematuria.  Patient has no other complaints at this time.     Subjective: A/O x4, patient states has been exposed to RSV by his grandchildren.  Also states his son who lives with him was ill approximately 2 weeks ago with a viral infection.  Prior to admission began to have nonproductive cough and positive SOB.  Not on home O2.  Does not have sleep apnea. Patient seen by [facility] hospitalist on 2/17 no charge  Assessment & Plan: Covid vaccination; 2/3 vaccinated   Principal Problem:   Acute respiratory failure with hypoxia ([place]) Active Problems:   HTN (hypertension)   Hyperlipidemia   Atrial fibrillation (HCC)   Type 2 diabetes mellitus without complication (HCC)   CHF exacerbation (HCC)   CAD (coronary artery disease) of artery bypass  graft   History of implantable cardioverter-defibrillator (ICD) placement   Benign essential HTN   Chronic kidney disease, stage 3b ([place])    Acute respiratory failure with hypoxia ([place])- (present on admission) -O2 sats down to 87% -No O2 requirement at baseline -Continuous pulse ox monitoring.  Titrate O2 to maintain SPO2 > 92%  -May be secondary to CHF exacerbation, or given his exposure to ill patients viral infection. -Low suspicion for bacterial pneumonia as there is no leukocytosis, no fever, no pneumonia finding on chest x-ray -Patient has no chest pain, no changes on EKG-troponin not indicated at this time -Patient is not tachycardic, blood pressure stable, peripheral edema is symmetrical, very low suspicion for clot and D-dimer if patient becomes tachycardic or hypotensive, or suspicion increases for PE -Repeat chest x-ray in the a.m. -2/17 given patient's exposure obtain respiratory virus panel   Acute HFrEF - He has a longstanding cardiomyopathy with EF 20-25% in 2013 and similar results by repeat echo in 08/2017 but also noted to have moderate pulmonary HTN at that time. Admitted with worsening dyspnea on exertion and an associated 5 lb weight gain on his home scales.  - BNP elevated to 511 on admission repeat echo is pending. He received 40 mg of IV Lasix upon admission and is scheduled to start 60 mg twice daily today. Renal function remains stable and would continue with IV Lasix dosing. Anticipate he will require IV diuresis for 1-2 additional days as his baseline  weight is around 235 lbs and at 242 lbs today. He was on Lasix 40mg  daily prior to admission and will likely require 60mg  daily or switching to Torsemide. We also reviewed the importance of monitoring his sodium intake as he eats at [employer] on a daily basis. - Remains on Coreg, Losartan and Hydralazine. He had angioedema with ACE-I in the past and I suspect this is why he was never switched to [place]. If his  EF remains reduced, could add [place] or [place] prior to discharge. Spironolactone could be considered as well but would be cautious given his renal function. -Strict in and out - Daily weight   CAD - He is s/p CABG in 2013. He reports dyspnea on exertion in the setting of fluid overload but no symptoms prior to this and no recent chest pain.  - Repeat echo pending to reassess EF and wall motion. Continue beta-blocker and statin. No ASA given the need for anticoagulation.    Mitral valve repair - He underwent MV repair at that time of CABG in 2013. Echo in 2019 showed mild MR and mild to moderate TR. Repeat echocardiogram pending.    VT and sinus node dysfunction - He is s/p ICD placement in 2013 with upgrade to Biotronik Bi-V ICD in 09/2020. Followed by Dr. Ladona Ridgel as an outpatient.    Permanent Atrial fibrillation - Rates have been well-controlled and he remains on Coreg 12.5mg  BID. On Eliquis 2.5mg  BID for anticoagulation which is the appropriate dose given his age of 53 and creatinine > 1.5.  Essential HTN (hypertension)- (present on admission) Continue hydralazine, Coreg, losartan Blood pressure well controlled at admission 133/62 Continue to monitor   CKD stage 3B (baseline Cr 1.9 - 2.1 in 2022).  -At 1.72 on admission and stable at 1.70 today. Continue to follow with diuresis.    DM type II uncontrolled with complication on long-term insulin  Patient takes 18 units of basal insulin at home Continue reduced dose, 10 units, basal insulin while in the hospital Sliding scale coverage Carb modified diet   HLD(present on admission) Continue Crestor Continue to monitor   Patient seen by [facility]ist on 2/17 no charge      DVT prophylaxis: Eliquis Code Status: Full Family Communication: 2/17 wife at bedside for discussion of plan of care all questions answered Status is: Inpatient    Dispo: The patient is from: Home              Anticipated d/c is to: Home               Anticipated d/c date is: 3 days              Patient currently is not medically stable to d/c.      Consultants:  Cardiology Dr. Tenny Craw    Procedures/Significant Events:    I have personally reviewed and interpreted all radiology studies and my findings are as above.  VENTILATOR SETTINGS:    Cultures   Antimicrobials:    Devices    LINES / TUBES:      Continuous Infusions:   Objective: Vitals:   10/15/21 0000 10/15/21 0102 10/15/21 0108 10/15/21 0434  BP: (!) 111/55 138/64  127/71  Pulse: 70 60  70  Resp: (!) 24 20  18   Temp:  98.9 F (37.2 C)  98.7 F (37.1 C)  TempSrc:  Oral  Oral  SpO2: 96% 97%  98%  Weight:   110 kg   Height:  6\' 4"  (1.93 m)     Intake/Output Summary (Last 24 hours) at 10/15/2021 [id number] Last data filed at 10/15/2021 0438 Gross per 24 hour  Intake --  Output 675 ml  Net -675 ml   Filed Weights   10/14/21 2300 10/15/21 0108  Weight: 110.8 kg 110 kg    Examination:  General: A/O x4, positive acute respiratory distress Eyes: negative scleral hemorrhage, negative anisocoria, negative icterus ENT: Negative Runny nose, negative gingival bleeding, Neck:  Negative scars, masses, torticollis, lymphadenopathy, JVD Lungs: decreased breath sounds bilaterally, positive inspiratory/expiratory wheezes Cardiovascular: Regular rate and rhythm without murmur gallop or rub normal S1 and S2 Abdomen: negative abdominal pain, nondistended, positive soft, bowel sounds, no rebound, no ascites, no appreciable mass Extremities: No significant cyanosis, clubbing, or edema bilateral lower extremities Skin: Negative rashes, lesions, ulcers Psychiatric:  Negative depression, negative anxiety, negative fatigue, negative mania  Central nervous system:  Cranial nerves II through XII intact, tongue/uvula midline, all extremities muscle strength 5/5, sensation intact throughout, negative dysarthria, negative expressive aphasia, negative receptive  aphasia.  .     Data Reviewed: Care during the described time interval was provided by me .  I have reviewed this patient's available data, including medical history, events of note, physical examination, and all test results as part of my evaluation.  CBC: Recent Labs  Lab 10/14/21 2219 10/15/21 0431  WBC 7.0 5.3  NEUTROABS  --  3.7  HGB 14.4 13.8  HCT 44.9 40.8  MCV 94.1 94.4  PLT 150 [id number]*   Basic Metabolic Panel: Recent Labs  Lab 10/14/21 2219 10/15/21 0431  NA 128* 134*  K 3.9 3.6  CL 95* 97*  CO2 22 25  GLUCOSE 151* 94  BUN 22 22  CREATININE 1.72* 1.70*  CALCIUM 8.8* 8.7*  MG 1.8 1.8   GFR: Estimated Creatinine Clearance: 43.2 mL/min (A) (by C-G formula based on SCr of 1.7 mg/dL (H)). Liver Function Tests: Recent Labs  Lab 10/15/21 0431  AST 34  ALT 26  ALKPHOS 62  BILITOT 0.8  PROT 5.9*  ALBUMIN 3.5   No results for input(s): LIPASE, AMYLASE in the last 168 hours. No results for input(s): AMMONIA in the last 168 hours. Coagulation Profile: No results for input(s): INR, PROTIME in the last 168 hours. Cardiac Enzymes: No results for input(s): CKTOTAL, CKMB, CKMBINDEX, TROPONINI in the last 168 hours. BNP (last 3 results) No results for input(s): PROBNP in the last 8760 hours. HbA1C: No results for input(s): HGBA1C in the last 72 hours. CBG: Recent Labs  Lab 10/15/21 0722  GLUCAP 75   Lipid Profile: No results for input(s): CHOL, HDL, LDLCALC, TRIG, CHOLHDL, LDLDIRECT in the last 72 hours. Thyroid Function Tests: Recent Labs    10/15/21 0431  TSH 1.817   Anemia Panel: No results for input(s): VITAMINB12, FOLATE, FERRITIN, TIBC, IRON, RETICCTPCT in the last 72 hours. Sepsis Labs: No results for input(s): PROCALCITON, LATICACIDVEN in the last 168 hours.  Recent Results (from the past 240 hour(s))  Resp Panel by RT-PCR (Flu A&B, Covid) Nasopharyngeal Swab     Status: None   Collection Time: 10/14/21 10:50 PM   Specimen: Nasopharyngeal  Swab; Nasopharyngeal(NP) swabs in vial transport medium  Result Value Ref Range Status   SARS Coronavirus 2 by RT PCR NEGATIVE NEGATIVE Final    Comment: (NOTE) SARS-CoV-2 target nucleic acids are NOT DETECTED.  The SARS-CoV-2 RNA is generally detectable in upper respiratory specimens during the acute phase of infection. The lowest concentration of SARS-CoV-2  viral copies this assay can detect is 138 copies/mL. A negative result does not preclude SARS-Cov-2 infection and should not be used as the sole basis for treatment or other patient management decisions. A negative result may occur with  improper specimen collection/handling, submission of specimen other than nasopharyngeal swab, presence of viral mutation(s) within the areas targeted by this assay, and inadequate number of viral copies(<138 copies/mL). A negative result must be combined with clinical observations, patient history, and epidemiological information. The expected result is Negative.  Fact Sheet for Patients:  BloggerCourse.com  Fact Sheet for Healthcare Providers:  SeriousBroker.it  This test is no t yet approved or cleared by the [place] FDA and  has been authorized for detection and/or diagnosis of SARS-CoV-2 by FDA under an Emergency Use Authorization (EUA). This EUA will remain  in effect (meaning this test can be used) for the duration of the COVID-19 declaration under Section 564(b)(1) of the Act, 21 U.S.C.section 360bbb-3(b)(1), unless the authorization is terminated  or revoked sooner.       Influenza A by PCR NEGATIVE NEGATIVE Final   Influenza B by PCR NEGATIVE NEGATIVE Final    Comment: (NOTE) The Xpert Xpress SARS-CoV-2/FLU/RSV plus assay is intended as an aid in the diagnosis of influenza from Nasopharyngeal swab specimens and should not be used as a sole basis for treatment. Nasal washings and aspirates are unacceptable for Xpert Xpress  SARS-CoV-2/FLU/RSV testing.  Fact Sheet for Patients: BloggerCourse.com  Fact Sheet for Healthcare Providers: SeriousBroker.it  This test is not yet approved or cleared by the [place] FDA and has been authorized for detection and/or diagnosis of SARS-CoV-2 by FDA under an Emergency Use Authorization (EUA). This EUA will remain in effect (meaning this test can be used) for the duration of the COVID-19 declaration under Section 564(b)(1) of the Act, 21 U.S.C. section 360bbb-3(b)(1), unless the authorization is terminated or revoked.  Performed at [facility], [address] [place], [place] [address]          Radiology Studies: DG CHEST PORT 1 VIEW  Result Date: 10/15/2021 CLINICAL DATA:  Acute respiratory failure with hypoxia. EXAM: PORTABLE CHEST 1 VIEW COMPARISON:  October 14, 2021. FINDINGS: Stable cardiomegaly. Status post coronary bypass graft. Left-sided defibrillator is unchanged in position. Stable elevated right hemidiaphragm is noted with minimal right basilar subsegmental atelectasis. Left lung base is not included in field-of-view. Bony thorax is unremarkable. IMPRESSION: Stable elevated right hemidiaphragm with minimal right basilar subsegmental atelectasis. Left lung base is not included in field-of-view. Electronically Signed   By: Lupita Raider M.D.   On: 10/15/2021 06:41   DG Chest Portable 1 View  Result Date: 10/14/2021 CLINICAL DATA:  Shortness of breath EXAM: PORTABLE CHEST 1 VIEW COMPARISON:  10/12/2020 FINDINGS: Eventration of the right hemidiaphragm. Lungs are essentially clear. No pleural effusion or pneumothorax. Cardiomegaly. Left subclavian ICD. Postsurgical changes related to prior CABG. Median sternotomy. IMPRESSION: No evidence of acute cardiopulmonary disease. Electronically Signed   By: Charline Bills M.D.   On: 10/14/2021 22:42        Scheduled Meds:  apixaban  2.5 mg Oral BID    carvedilol  12.5 mg Oral BID WC   furosemide  60 mg Intravenous Q12H   gabapentin  100 mg Oral TID   insulin aspart  0-15 Units Subcutaneous TID WC   insulin aspart  0-5 Units Subcutaneous QHS   insulin detemir  10 Units Subcutaneous QHS   losartan  25 mg Oral Daily  melatonin  6 mg Oral QHS   pantoprazole  40 mg Oral Daily   rosuvastatin  5 mg Oral QPM   Continuous Infusions:   LOS: 1 day    Time spent:40 min    Caeleigh Prohaska, Geraldo Docker, MD [facility]   If 7PM-7AM, please contact night-coverage 10/15/2021, 7:52 AM

## 2021-10-15 NOTE — Assessment & Plan Note (Signed)
Continue hydralazine, Coreg, losartan Blood pressure well controlled at admission 133/62 Continue to monitor

## 2021-10-15 NOTE — Consult Note (Addendum)
Cardiology Consultation:   Patient ID: Nevaeh Hollyfield Hammerstrom MRN: [id number]; DOB: 12-May-1934  Admit date: 10/14/2021 Date of Consult: 10/15/2021  PCP:  Adaline Sill, NP   [facility] Providers Cardiologist:  Cristopher Peru, MD        Patient Profile:   Kayvon Broaddus Minar is a 88 y.o. male with a hx of HFrEF/ICM (EF 20-25% in 2013 and similar results by repeat echo in 08/2017, VT and sinus node dysfunction (s/p ICD placement in 2013 with upgrade to Biotronik Bi-V ICD in 09/2020), CAD (s/p CABG in 2013 with mitral valve repair), permanent atrial fibrillation, HTN, HLD, Type 2 DM and Stage 3 CKD who is being seen 10/15/2021 for the evaluation of CHF at the request of Dr. Clearence Ped.  History of Present Illness:   Mr. Proto was last examined by Dr. Lovena Le in 12/2020 and denied any recent chest pain or syncope at that time. He was continued on his current cardiac medications including Eliquis, Coreg, Lasix, Hydralazine, Losartan and Crestor.  He presented to [facility] ED on 10/14/2021 for evaluation of worsening dyspnea. In talking with the patient today, he reports being in his usual state of health until 3 to 4 days prior to admission when he developed worsening dyspnea at rest and with activity. Reports associated wheezing and a nonproductive cough at that time. He has noted some lower extremity edema and reports that his weight had increased by approximately 5 pounds on his home scales prior to admission. He did try taking an extra Lasix tablet for 2 days with minimal improvement. Says that his typical weight is around 235 lbs on his home scales.  He denies any recent chest pain or palpitations.  Oxygen saturations were initially in the 80's on room air but improved to 95% on 2 L nasal cannula. Initial labs showed WBC 7.0, Hgb 14.4, platelets 150, Na+ 128, K+ 3.9 and creatinine 1.72 (improved to his prior baseline appears to be between 1.9 - 2.1). BNP 511.  Negative for COVID and influenza.  CXR  with no acute cardiopulmonary findings.  EKG shows atrial fibrillation, heart rate 70 with RBBB.  He received 40 mg of IV Lasix upon admission and is scheduled to start 60 mg twice daily today. Recorded output of -1.1 L thus far. Repeat labs this AM show his sodium has improved to 134 and creatinine remains stable at 1.70. Weight at 242 lbs today (was at 244 lbs in 12/2020).    Past Medical History:  Diagnosis Date   Allergy to ACE inhibitors    Angioedema many years ago; patient has tolerated Losartan (ARB) in the past - it was held during [facility] for GI bleed and worsening renal function >> resume Losartan 25 mg QD 06/2015   Arthritis    Atrial fibrillation (HCC)    PAF, CHADs2Vasc = 5   Carotid artery disease (HCC)    [id number] LICA   CHF (congestive heart failure) (HCC)    CKD (chronic kidney disease)    Coronary artery disease    Diabetes mellitus    GERD (gastroesophageal reflux disease)    Hyperlipidemia    Hypertension    ICD (implantable cardiac defibrillator) in place 03/28/2012   Biotronik, Dr. Lovena Le 03/28/12   Ischemic cardiomyopathy    S/P CABG x 3; EF 25%   Mitral regurgitation    S/P mitral valve repair 2013   Myocardial infarction [facility])    Renal insufficiency     Past Surgical History:  Procedure Laterality Date  BIV UPGRADE N/A 10/12/2020   Procedure: BIV ICD UPGRADE;  Surgeon: Evans Lance, MD;  Location: [place] CV LAB;  Service: Cardiovascular;  Laterality: N/A;   CHOLECYSTECTOMY N/A 09/20/2017   Procedure: LAPAROSCOPIC SUBTOTAL CHOLECYSTECTOMY;  Surgeon: Ralene Ok, MD;  Location: [place];  Service: General;  Laterality: N/A;   COLONOSCOPY N/A 04/24/2014   RMR Pancolonic diverticulosis   CORONARY ARTERY BYPASS GRAFT  10/17/2011   Procedure: CORONARY ARTERY BYPASS GRAFTING (CABG);  Surgeon: Rexene Alberts, MD;  Location: [place];  Service: Open Heart Surgery;  Laterality: N/A;   ERCP N/A 09/29/2017   Procedure: ENDOSCOPIC RETROGRADE  CHOLANGIOPANCREATOGRAPHY (ERCP);  Surgeon: Carol Ada, MD;  Location: [place];  Service: Endoscopy;  Laterality: N/A;   ESOPHAGOGASTRODUODENOSCOPY N/A 04/24/2014   RMR Subtle nodularity the gastric mucosa of uncertain significance-status post biopsy. Hiatal hernia. chronic inflammation, no H.pylori   ESOPHAGOGASTRODUODENOSCOPY N/A 06/24/2016   Dr. Gala Romney: normal esophagus, small hiatal hernia, normal duodenum   ICD  03/28/2012   IMPLANTABLE CARDIOVERTER DEFIBRILLATOR IMPLANT N/A 03/28/2012   Procedure: IMPLANTABLE CARDIOVERTER DEFIBRILLATOR IMPLANT;  Surgeon: Evans Lance, MD;  Location: [facility] CATH LAB;  Service: Cardiovascular;  Laterality: N/A;   KNEE ARTHROSCOPY  ~ 2008   right   LEFT AND RIGHT HEART CATHETERIZATION WITH CORONARY ANGIOGRAM N/A 10/12/2011   Procedure: LEFT AND RIGHT HEART CATHETERIZATION WITH CORONARY ANGIOGRAM;  Surgeon: Wellington Hampshire, MD;  Location: [place] CATH LAB;  Service: Cardiovascular;  Laterality: N/A;   MITRAL VALVE REPAIR  10/17/2011   Procedure: MITRAL VALVE REPAIR (MVR);  Surgeon: Rexene Alberts, MD;  Location: [place];  Service: Open Heart Surgery;  Laterality: N/A;   NASAL SINUS SURGERY  1990's   right     Home Medications:  Prior to Admission medications   Medication Sig Start Date End Date Taking? Authorizing Provider  CALCIUM-MAGNESIUM-ZINC PO Take 2 tablets by mouth daily.    [provider]  carvedilol (COREG) 12.5 MG tablet Take 1 tablet (12.5 mg total) by mouth in the morning, at noon, and at bedtime. 11/25/20   Evans Lance, MD  Cholecalciferol (VITAMIN D3) 10 MCG (400 UNIT) CAPS Take 400 Units by mouth daily.    [provider]  Cyanocobalamin (VITAMIN B-12 IJ) Inject 1 mL as directed every 30 (thirty) days.     [provider]  ELIQUIS 2.5 MG TABS tablet TAKE (1) TABLET TWICE A DAY. 03/25/21   Evans Lance, MD  furosemide (LASIX) 40 MG tablet Take 40 mg by mouth daily. 03/09/20   [provider]  gabapentin  (NEURONTIN) 100 MG capsule Take 100 mg by mouth 3 (three) times daily.    [provider]  glipiZIDE (GLUCOTROL XL) 10 MG 24 hr tablet Take 10 mg by mouth 2 times daily at 12 noon and 4 pm.    [provider]  hydrALAZINE (APRESOLINE) 25 MG tablet Take 25 mg by mouth 3 (three) times daily as needed (for bp levels).    [provider]  Insulin Glargine (TOUJEO SOLOSTAR) 300 UNIT/ML SOPN Inject 18 Units as directed daily. 04/27/15   [provider]  losartan (COZAAR) 25 MG tablet Take 1 tablet (25 mg total) by mouth daily. 07/11/15   Richardson Dopp T, PA-C  Multiple Vitamins-Minerals (MULTIVITAMINS THER. W/MINERALS) TABS Take 1 tablet by mouth daily.    [provider]  omeprazole (PRILOSEC) 40 MG capsule Take 1 capsule (40 mg total) by mouth daily before breakfast. 02/27/20   Aliene Altes  S, PA-C  rosuvastatin (CRESTOR) 5 MG tablet Take 5 mg by mouth every evening.    [provider]    Inpatient Medications: Scheduled Meds:  apixaban  2.5 mg Oral BID   carvedilol  12.5 mg Oral BID WC   furosemide  60 mg Intravenous Q12H   gabapentin  100 mg Oral TID   insulin aspart  0-15 Units Subcutaneous TID WC   insulin aspart  0-5 Units Subcutaneous QHS   insulin detemir  10 Units Subcutaneous QHS   losartan  25 mg Oral Daily   melatonin  6 mg Oral QHS   pantoprazole  40 mg Oral Daily   rosuvastatin  5 mg Oral QPM   Continuous Infusions:  PRN Meds: acetaminophen **OR** acetaminophen, albuterol, hydrALAZINE, morphine injection, ondansetron **OR** ondansetron (ZOFRAN) IV, oxyCODONE  Allergies:    Allergies  Allergen Reactions   Ace Inhibitors Swelling and Other (See Comments)    Kidney issues; angioedema per PMH in chart    Penicillins Palpitations        Social History:   Social History   Socioeconomic History   Marital status: Married    Spouse name: Not on file   Number of children: Not on file   Years of education: Not on file    Highest education level: Not on file  Occupational History   Occupation: Retired  Tobacco Use   Smoking status: Never   Smokeless tobacco: Never  Vaping Use   Vaping Use: Never used  Substance and Sexual Activity   Alcohol use: No    Alcohol/week: 0.0 standard drinks   Drug use: No   Sexual activity: Yes  Other Topics Concern   Not on file  Social History Narrative   Not on file   Social Determinants of Health   Financial Resource Strain: Not on file  Food Insecurity: Not on file  Transportation Needs: Not on file  Physical Activity: Not on file  Stress: Not on file  Social Connections: Not on file  Intimate Partner Violence: Not on file    Family History:    Family History  Problem Relation Age of Onset   Heart disease Mother    Diabetes Father    Cardiomyopathy Father    Colon cancer Neg Hx      ROS:  Please see the history of present illness.   All other ROS reviewed and negative.     Physical Exam/Data:   Vitals:   10/15/21 0000 10/15/21 0102 10/15/21 0108 10/15/21 0434  BP: (!) 111/55 138/64  127/71  Pulse: 70 60  70  Resp: (!) 24 20  18   Temp:  98.9 F (37.2 C)  98.7 F (37.1 C)  TempSrc:  Oral  Oral  SpO2: 96% 97%  98%  Weight:   110 kg   Height:   6\' 4"  (1.93 m)     Intake/Output Summary (Last 24 hours) at 10/15/2021 0833 Last data filed at 10/15/2021 0438 Gross per 24 hour  Intake --  Output 675 ml  Net -675 ml   Last 3 Weights 10/15/2021 10/14/2021 01/13/2021  Weight (lbs) 242 lb 8.1 oz 244 lb 4.3 oz 244 lb  Weight (kg) 110 kg 110.8 kg 110.678 kg     Body mass index is 29.52 kg/m.  General: Pleasant elderly male appearing in no acute distress HEENT: normal Neck: JVD to jawline.  Vascular: No carotid bruits; Distal pulses 2+ bilaterally Cardiac:  normal S1, S2; Irregularly irregular. 2/6 systolic murmur along Apex.  Lungs: Rales along bases and expiratory wheezing along upper lung fields.  Abd: soft, nontender, no hepatomegaly  Ext:  1+ pitting edema bilaterally.  Musculoskeletal:  No deformities, BUE and BLE strength normal and equal Skin: warm and dry  Neuro:  CNs 2-12 intact, no focal abnormalities noted Psych:  Normal affect   EKG:  The EKG was personally reviewed and demonstrates: Atrial fibrillation, heart rate 70 with RBBB. Telemetry:  Telemetry was personally reviewed and demonstrates:  V-paced rhythm, HR 70's with PVC's.   Relevant CV Studies:  Echocardiogram: 08/2017 Study Conclusions   - Left ventricle: The cavity size was moderately dilated. Wall    thickness was normal. Systolic function was severely reduced. The    estimated ejection fraction was in the range of 20% to 25%.    Diffuse hypokinesis. There is akinesis of the inferolateral    myocardium. The study is not technically sufficient to allow    evaluation of LV diastolic function.  - Aortic valve: There was trivial regurgitation.  - Mitral valve: Prior procedures included surgical repair. There    was mild regurgitation. Valve area by continuity equation (using    LVOT flow): 1.62 cm^2.  - Tricuspid valve: There was mild-moderate regurgitation.  - Pulmonary arteries: Systolic pressure was moderately increased.    PA peak pressure: 53 mm Hg (S).   Impressions:   - Definity used; severe LV dysfunction; moderate LVE; trace AI; s/p    MV repair with mean gradient 3 mmHg and mild MR; mild to moderate    TR with moderate pulmonary hypertension.   Laboratory Data:  High Sensitivity Troponin:  No results for input(s): TROPONINIHS in the last 720 hours.   Chemistry Recent Labs  Lab 10/14/21 2219 10/15/21 0431  NA 128* 134*  K 3.9 3.6  CL 95* 97*  CO2 22 25  GLUCOSE 151* 94  BUN 22 22  CREATININE 1.72* 1.70*  CALCIUM 8.8* 8.7*  MG 1.8 1.8  GFRNONAA 38* 39*  ANIONGAP 11 12    Recent Labs  Lab 10/15/21 0431  PROT 5.9*  ALBUMIN 3.5  AST 34  ALT 26  ALKPHOS 62  BILITOT 0.8   Lipids No results for input(s): CHOL, TRIG, HDL,  LABVLDL, LDLCALC, CHOLHDL in the last 168 hours.  Hematology Recent Labs  Lab 10/14/21 2219 10/15/21 0431  WBC 7.0 5.3  RBC 4.77 4.32  HGB 14.4 13.8  HCT 44.9 40.8  MCV 94.1 94.4  MCH 30.2 31.9  MCHC 32.1 33.8  RDW 14.2 14.2  PLT 150 131*   Thyroid  Recent Labs  Lab 10/15/21 0431  TSH 1.817    BNP Recent Labs  Lab 10/14/21 2219  BNP 511.0*    DDimer No results for input(s): DDIMER in the last 168 hours.   Radiology/Studies:  DG CHEST PORT 1 VIEW  Result Date: 10/15/2021 CLINICAL DATA:  Acute respiratory failure with hypoxia. EXAM: PORTABLE CHEST 1 VIEW COMPARISON:  October 14, 2021. FINDINGS: Stable cardiomegaly. Status post coronary bypass graft. Left-sided defibrillator is unchanged in position. Stable elevated right hemidiaphragm is noted with minimal right basilar subsegmental atelectasis. Left lung base is not included in field-of-view. Bony thorax is unremarkable. IMPRESSION: Stable elevated right hemidiaphragm with minimal right basilar subsegmental atelectasis. Left lung base is not included in field-of-view. Electronically Signed   By: Marijo Conception M.D.   On: 10/15/2021 06:41   DG Chest Portable 1 View  Result Date: 10/14/2021 CLINICAL DATA:  Shortness of breath EXAM: PORTABLE CHEST 1  VIEW COMPARISON:  10/12/2020 FINDINGS: Eventration of the right hemidiaphragm. Lungs are essentially clear. No pleural effusion or pneumothorax. Cardiomegaly. Left subclavian ICD. Postsurgical changes related to prior CABG. Median sternotomy. IMPRESSION: No evidence of acute cardiopulmonary disease. Electronically Signed   By: Julian Hy M.D.   On: 10/14/2021 22:42     Assessment and Plan:   1. Acute HFrEF - He has a longstanding cardiomyopathy with EF 20-25% in 2013 and similar results by repeat echo in 08/2017 but also noted to have moderate pulmonary HTN at that time. Admitted with worsening dyspnea on exertion and an associated 5 lb weight gain on his home scales.  -  BNP elevated to 511 on admission repeat echo is pending. He received 40 mg of IV Lasix upon admission and is scheduled to start 60 mg twice daily today. Renal function remains stable and would continue with IV Lasix dosing. Anticipate he will require IV diuresis for 1-2 additional days as his baseline weight is around 235 lbs and at 242 lbs today. He was on Lasix 40mg  daily prior to admission and will likely require 60mg  daily or switching to Torsemide. We also reviewed the importance of monitoring his sodium intake as he eats at [employer] on a daily basis. - Remains on Coreg, Losartan and Hydralazine. He had angioedema with ACE-I in the past and I suspect this is why he was never switched to [place]. If his EF remains reduced, could add [place] or Jardiance prior to discharge. Spironolactone could be considered as well but would be cautious given his renal function.   2. CAD - He is s/p CABG in 2013. He reports dyspnea on exertion in the setting of fluid overload but no symptoms prior to this and no recent chest pain.  - Repeat echo pending to reassess EF and wall motion. Continue beta-blocker and statin. No ASA given the need for anticoagulation.   3. Mitral Valve Repair - He underwent MV repair at that time of CABG in 2013. Echo in 2019 showed mild MR and mild to moderate TR. Repeat echocardiogram pending.   4. VT and Sinus Node Dysfunction - He is s/p ICD placement in 2013 with upgrade to Biotronik Bi-V ICD in 09/2020. Followed by Dr. Lovena Le as an outpatient.   5. Permanent Atrial Fibrillation - Rates have been well-controlled and he remains on Coreg 12.5mg  BID. On Eliquis 2.5mg  BID for anticoagulation which is the appropriate dose given his age of 44 and creatinine > 1.5.  6. Stage 3 CKD -  Creatinine previously at 1.9 - 2.1 in 2022. At 1.72 on admission and stable at 1.70 today. Continue to follow with diuresis.   Risk Assessment/Risk Scores:    New York Heart Association (NYHA)  Functional Class NYHA Class III   For questions or updates, please contact [facility] Please consult [email or web address] for contact info under    Signed, Erma Heritage, PA-C  10/15/2021 8:33 AM  Patient seen and examined.  Agree with findings as noted by [place] above.  Patient is an 88 year old with a history of HFrEF (LVEF 20 to 25%), VT, sinus node dysfunction (status post ICD with BiV upgrade in February 2022), CAD front of the C CABG with mitral valve repair in 2013), permanent atrial fibrillation, hypertension, diabetes, chronic kidney disease.  He was admitted with increased shortness of breath, weight gain.  He is beginning to diurese.  Currently undergoing echocardiogram.  He denies chest pain.  Currently says his breathing is some better.  On exam: Patient is in no acute distress laying in bed.  JVP is increased.  Lungs relatively clear supine.  Cardiac exam: Irregularly irregular, S1, S2.  No S3.  Grade 2/6 systolic murmur at the apex.  Abdomen is supple obese.  Extremities with 1+ edema.  Twelve-lead EKG shows atrial fibrillation rate of 70 bpm.  Impression: Would continue diuresis with following of renal function.  Echo is pending.  We will review device interrogations regarding ectopy.  Is not clear why the patient is not on Entresto given that he is tolerating losartan.  We will review.  Also consider addition of Jardiance.  Again renal function may preclude spironolactone I would hold off on this. Signed, Dorris Carnes, MD, [facility].

## 2021-10-15 NOTE — Assessment & Plan Note (Signed)
Continue Eliquis and Coreg Rate well controlled at admission Monitor on telemetry

## 2021-10-15 NOTE — TOC Initial Note (Signed)
Transition of Care [facility]) - Initial/Assessment Note    Patient Details  Name: Wayne Gordon MRN: [id number] Date of Birth: 09-08-1933  Transition of Care [facility]) CM/SW Contact:    Iona Beard, [place] Phone Number: 10/15/2021, 9:19 AM  Clinical Narrative:                 [facility] consulted for heart failure screen. CSW spoke with pts wife to complete assessment. Pt lives with his wife and is independent in completing his ADLs. Pt is able to provide his own transportation as needed. Pt has not had HH services in the past. Pt uses a cane when it is needed.  CSW completed CHF screen with pts wife. Pt weighs himself daily. Pt takes all medications as prescribed. Pt follows a heart healthy diet and uses no table salt. Pts cardiologist is Dr. Lovena Le. TOC to follow for possible D/C needs.   Expected Discharge Plan: Home/Self Care Barriers to Discharge: Continued Medical Work up   Patient Goals and CMS Choice Patient states their goals for this hospitalization and ongoing recovery are:: Return home CMS Medicare.gov Compare Post Acute Care list provided to:: Patient Represenative (must comment) Choice offered to / list presented to : Spouse  Expected Discharge Plan and Services Expected Discharge Plan: Home/Self Care In-house Referral: Clinical Social Work Discharge Planning Services: CM Consult   Living arrangements for the past 2 months: Single Family Home                                      Prior Living Arrangements/Services Living arrangements for the past 2 months: Single Family Home Lives with:: Spouse Patient language and need for interpreter reviewed:: Yes Do you feel safe going back to the place where you live?: Yes      Need for Family Participation in Patient Care: No (Comment) Care giver support system in place?: Yes (comment) Current home services: DME (cane) Criminal Activity/Legal Involvement Pertinent to Current Situation/Hospitalization: No - Comment as  needed  Activities of Daily Living Home Assistive Devices/Equipment: None ADL Screening (condition at time of admission) Patient's cognitive ability adequate to safely complete daily activities?: Yes Is the patient deaf or have difficulty hearing?: Yes Does the patient have difficulty seeing, even when wearing glasses/contacts?: No Does the patient have difficulty concentrating, remembering, or making decisions?: Yes Patient able to express need for assistance with ADLs?: Yes Does the patient have difficulty dressing or bathing?: No Independently performs ADLs?: Yes (appropriate for developmental age) Does the patient have difficulty walking or climbing stairs?: Yes Weakness of Legs: Both Weakness of Arms/Hands: None  Permission Sought/Granted                  Emotional Assessment Appearance:: Appears stated age     Orientation: : Oriented to Self, Oriented to Place, Oriented to  Time, Oriented to Situation Alcohol / Substance Use: Not Applicable Psych Involvement: No (comment)  Admission diagnosis:  Acute respiratory failure with hypoxia ([place]) [J96.01] Patient Active Problem List   Diagnosis Date Noted   CHF exacerbation ([place]) 10/15/2021   CAD (coronary artery disease) of artery bypass graft 10/15/2021   History of implantable cardioverter-defibrillator (ICD) placement 10/15/2021   Benign essential HTN 10/15/2021   Chronic kidney disease, stage 3b ([place]) 10/15/2021   Acute respiratory failure with hypoxia ([place]) 10/14/2021   Bloating 02/27/2020   Nausea with vomiting 02/27/2020   Abdominal pain 02/27/2020  Iron deficiency anemia 02/08/2018   Stage III chronic kidney disease ([place]) 02/08/2018   Diabetes mellitus ([place]) 02/08/2018   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   Bacteremia due to Klebsiella pneumoniae 09/22/2017   Sepsis due to Klebsiella pneumoniae ([place]) 09/22/2017   Cholangitis 09/17/2017   Leukocytosis 09/17/2017   Cholelithiasis 09/17/2017   Elevated  liver enzymes 09/17/2017   Acute renal failure superimposed on stage 3 chronic kidney disease ([place]) 09/17/2017   Low blood pressure reading 09/17/2017   Lactic acidosis 09/17/2017   Elevated troponin 09/17/2017   Nausea without vomiting 01/10/2017   Gastrointestinal hemorrhage associated with intestinal diverticulosis 06/23/2016   Carotid artery disease ([place])    Syncope and collapse 07/11/2015   Ventricular arrhythmia 07/10/2015   Complete heart block (HCC)    ICD (implantable cardioverter-defibrillator) discharge    Atrial fibrillation ([place]) 06/04/2015   Diabetes ([place]) 06/02/2015   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   GERD (gastroesophageal reflux disease) 02/18/2015   Abdominal pain, epigastric 02/18/2015   Pleural effusion 09/01/2014   Rectal bleeding 03/25/2014   Acute blood loss anemia 03/25/2014   Adult body mass index 28.0-28.9 09/30/2013   Diabetes mellitus, type 2 ([place]) 08/09/2013   Type 2 diabetes mellitus without complication ([place]) [id number]   PVC's (premature ventricular contractions) 07/04/2013   Hyperlipidemia 01/03/2013   Automatic implantable cardioverter-defibrillator in situ 03/30/2012   S/P MVR (mitral valve repair) 01/16/2012   HTN (hypertension) 99991111   Chronic systolic heart failure ([place]) 10/31/2011   Ischemic cardiomyopathy 10/31/2011   CKD (chronic kidney disease) stage 3, GFR 30-59 ml/min (HCC) 10/31/2011   Current use of long term anticoagulation 10/31/2011   Long term (current) use of anticoagulants 10/28/2011   S/P mitral valve repair 10/17/2011   S/P CABG x 3 10/17/2011   Mitral regurgitation 10/12/2011   NSTEMI (non-ST elevated myocardial infarction) ([place]) 10/11/2011   PCP:  Adaline Sill, NP Pharmacy:   [facility], [place] [address] Phone: [phone] Fax: [phone]  [facility] - [place], [place] [address] Phone: [phone] Fax: [phone]     Social Determinants of Health (SDOH) Interventions    Readmission Risk Interventions Readmission Risk Prevention Plan 10/15/2021  Medication Screening Complete  Transportation Screening Complete  Some recent data might be hidden

## 2021-10-15 NOTE — Assessment & Plan Note (Signed)
Patient takes 18 units of basal insulin at home Continue reduced dose, 10 units, basal insulin while in the hospital Sliding scale coverage Carb modified diet

## 2021-10-15 NOTE — Assessment & Plan Note (Signed)
O2 sats down to 87% No O2 requirement at baseline Normalized on 3 L nasal cannula Continue oxygen supplementation, weaning off as tolerated Most likely secondary to CHF exacerbation- see below Low suspicion for pneumonia as there is no leukocytosis, no fever, no pneumonia finding on chest x-ray Patient has no chest pain, no changes on EKG-troponin not indicated at this time Patient is not tachycardic, blood pressure stable, peripheral edema is symmetrical, very low suspicion for clot and D-dimer if patient becomes tachycardic or hypotensive, or suspicion increases for PE Repeat chest x-ray in the a.m. Monitor

## 2021-10-15 NOTE — H&P (Signed)
History and Physical    Patient: Wayne Gordon KCM:[id number] DOB: Apr 09, 1934 DOA: 10/14/2021 DOS: the patient was seen and examined on 10/15/2021 PCP: Rebekah Chesterfield, NP  Patient coming from: Home  Chief Complaint:  Chief Complaint  Patient presents with   Shortness of Breath    HPI: Wayne Gordon is a 88 y.o. male with medical history significant of with history of atrial fibrillation, coronary artery disease, CHF, CKD, diabetes mellitus type 2, GERD, hyperlipidemia, hypertension, ICD placement, ischemic cardiomyopathy, and more presents to ED with chief complaint of dyspnea.  Patient reports he has dyspnea that started 2 days ago and has been getting worse since it started.  He has associated cough productive of clear sputum, and wheeze.  Patient reports orthopnea and bilateral lower extremity edema as well.  Patient had a 2 pound weight gain on Wednesday, and a 3 pound weight gain on Thursday.  He takes 40 mg of Lasix daily, but increased his Lasix to 100 mg on the day of presentation.  He did not have an increase in urine output.  He reports in fact he has noticed a decrease in urine output from his baseline.  Patient reports no history of COPD, so he has no inhaler use.  That he could attempt to use for his wheezing. Patient denies any fevers, chest pain, palpitations.  Patient denies any rashes, myalgias, diarrhea, constipation, dysuria, hematuria.  Patient has no other complaints at this time.  Patient does not smoke, does not drink alcohol, does not use illicit drugs.  He is vaccinated for COVID.  Patient is full code.  Review of Systems: As mentioned in the history of present illness. All other systems reviewed and are negative. Past Medical History:  Diagnosis Date   Allergy to ACE inhibitors    Angioedema many years ago; patient has tolerated Losartan (ARB) in the past - it was held during [facility] for GI bleed and worsening renal function >> resume Losartan 25 mg QD 06/2015    Arthritis    Atrial fibrillation (HCC)    PAF, CHADs2Vasc = 5   Carotid artery disease (HCC)    60-79% LICA   CHF (congestive heart failure) (HCC)    CKD (chronic kidney disease)    Coronary artery disease    Diabetes mellitus    GERD (gastroesophageal reflux disease)    Hyperlipidemia    Hypertension    ICD (implantable cardiac defibrillator) in place 03/28/2012   [place], Dr. Ladona Ridgel 03/28/12   Ischemic cardiomyopathy    S/P CABG x 3; EF 25%   Mitral regurgitation    S/P mitral valve repair 2013   Myocardial infarction [facility])    Renal insufficiency    Past Surgical History:  Procedure Laterality Date   BIV UPGRADE N/A 10/12/2020   Procedure: BIV ICD UPGRADE;  Surgeon: Marinus Maw, MD;  Location: [facility] INVASIVE CV LAB;  Service: Cardiovascular;  Laterality: N/A;   CHOLECYSTECTOMY N/A 09/20/2017   Procedure: LAPAROSCOPIC SUBTOTAL CHOLECYSTECTOMY;  Surgeon: Axel Filler, MD;  Location: [facility] OR;  Service: General;  Laterality: N/A;   COLONOSCOPY N/A 04/24/2014   RMR Pancolonic diverticulosis   CORONARY ARTERY BYPASS GRAFT  10/17/2011   Procedure: CORONARY ARTERY BYPASS GRAFTING (CABG);  Surgeon: Purcell Nails, MD;  Location: [facility] OR;  Service: Open Heart Surgery;  Laterality: N/A;   ERCP N/A 09/29/2017   Procedure: ENDOSCOPIC RETROGRADE CHOLANGIOPANCREATOGRAPHY (ERCP);  Surgeon: Jeani Hawking, MD;  Location: [facility] ENDOSCOPY;  Service: Endoscopy;  Laterality: N/A;  ESOPHAGOGASTRODUODENOSCOPY N/A 04/24/2014   RMR Subtle nodularity the gastric mucosa of uncertain significance-status post biopsy. Hiatal hernia. chronic inflammation, no H.pylori   ESOPHAGOGASTRODUODENOSCOPY N/A 06/24/2016   Dr. Gala Romney: normal esophagus, small hiatal hernia, normal duodenum   ICD  03/28/2012   IMPLANTABLE CARDIOVERTER DEFIBRILLATOR IMPLANT N/A 03/28/2012   Procedure: IMPLANTABLE CARDIOVERTER DEFIBRILLATOR IMPLANT;  Surgeon: Evans Lance, MD;  Location: [facility] CATH LAB;  Service: Cardiovascular;  Laterality: N/A;    KNEE ARTHROSCOPY  ~ 2008   right   LEFT AND RIGHT HEART CATHETERIZATION WITH CORONARY ANGIOGRAM N/A 10/12/2011   Procedure: LEFT AND RIGHT HEART CATHETERIZATION WITH CORONARY ANGIOGRAM;  Surgeon: Wellington Hampshire, MD;  Location: [place] CATH LAB;  Service: Cardiovascular;  Laterality: N/A;   MITRAL VALVE REPAIR  10/17/2011   Procedure: MITRAL VALVE REPAIR (MVR);  Surgeon: Rexene Alberts, MD;  Location: [place];  Service: Open Heart Surgery;  Laterality: N/A;   NASAL SINUS SURGERY  1990's   right   Social History:  reports that he has never smoked. He has never used smokeless tobacco. He reports that he does not drink alcohol and does not use drugs.  Allergies  Allergen Reactions   Ace Inhibitors Swelling and Other (See Comments)    Kidney issues; angioedema per PMH in chart    Penicillins Palpitations    Has patient had a PCN reaction causing immediate rash, facial/tongue/throat swelling, SOB or lightheadedness with hypotension:  Has patient had a PCN reaction causing severe rash involving mucus membranes or skin necrosis:  Has patient had a PCN reaction that required hospitalization  Has patient had a PCN reaction occurring within the last 10 years: If all of the above answers are "NO", then may proceed with Cephalosporin use.    Family History  Problem Relation Age of Onset   Heart disease Mother    Diabetes Father    Cardiomyopathy Father    Colon cancer Neg Hx     Prior to Admission medications   Medication Sig Start Date End Date Taking? Authorizing Provider  CALCIUM-MAGNESIUM-ZINC PO Take 2 tablets by mouth daily.    [provider]  carvedilol (COREG) 12.5 MG tablet Take 1 tablet (12.5 mg total) by mouth in the morning, at noon, and at bedtime. 11/25/20   Evans Lance, MD  Cholecalciferol (VITAMIN D3) 10 MCG (400 UNIT) CAPS Take 400 Units by mouth daily.    [provider]  Cyanocobalamin (VITAMIN B-12 IJ) Inject 1 mL as directed every 30 (thirty) days.      [provider]  ELIQUIS 2.5 MG TABS tablet TAKE (1) TABLET TWICE A DAY. 03/25/21   Evans Lance, MD  furosemide (LASIX) 40 MG tablet Take 40 mg by mouth daily. 03/09/20   [provider]  gabapentin (NEURONTIN) 100 MG capsule Take 100 mg by mouth 3 (three) times daily.    [provider]  glipiZIDE (GLUCOTROL XL) 10 MG 24 hr tablet Take 10 mg by mouth 2 times daily at 12 noon and 4 pm.    [provider]  hydrALAZINE (APRESOLINE) 25 MG tablet Take 25 mg by mouth 3 (three) times daily as needed (for bp levels).    [provider]  Insulin Glargine (TOUJEO SOLOSTAR) 300 UNIT/ML SOPN Inject 18 Units as directed daily. 04/27/15   [provider]  losartan (COZAAR) 25 MG tablet Take 1 tablet (25 mg total) by mouth daily. 07/11/15   Richardson Dopp T, PA-C  Multiple Vitamins-Minerals (MULTIVITAMINS THER. W/MINERALS) TABS  Take 1 tablet by mouth daily.    [provider]  omeprazole (PRILOSEC) 40 MG capsule Take 1 capsule (40 mg total) by mouth daily before breakfast. 02/27/20   Erenest Rasher, PA-C  rosuvastatin (CRESTOR) 5 MG tablet Take 5 mg by mouth every evening.    [provider]    Physical Exam: Vitals:   10/14/21 2300 10/14/21 2300 10/14/21 2330 10/15/21 0000  BP: 133/62  127/60 (!) 111/55  Pulse: 70  70 70  Resp: (!) 24  (!) 24 (!) 24  Temp:      TempSrc:      SpO2: 95%  96% 96%  Weight:  110.8 kg     1.  General: Patient lying supine in bed,  no acute distress   2. Psychiatric: Alert and oriented x 3, mood and behavior normal for situation, pleasant and cooperative with exam   3. Neurologic: Speech and language are normal, face is symmetric, moves all 4 extremities voluntarily, at baseline without acute deficits on limited exam   4. HEENMT:  Head is atraumatic, normocephalic, pupils reactive to light, neck is supple, trachea is midline, mucous membranes are moist   5. Respiratory : Diffuse wheezing on  auscultation, no rales, no rhonchi, no cyanosis, cough productive of clear sputum seen at bedside, speaking in full sentences   6. Cardiovascular : Heart rate normal, rhythm is regular, no murmurs, rubs or gallops, 2+ peripheral edema just past knee, peripheral pulses palpated   7. Gastrointestinal:  Abdomen is soft, nondistended, nontender to palpation bowel sounds active, no masses or organomegaly palpated   8. Skin:  Skin is warm, dry and intact without rashes, acute lesions, or ulcers on limited exam   9.Musculoskeletal:  No acute deformities or trauma, no asymmetry in tone, peripheral pulses palpated, no tenderness to palpation in the extremities   Data Reviewed: In the ED Temp 99.1, heart rate 70, respiratory 15-34, blood pressure admission 133/62, satting at 95% on 3 L nasal cannula No leukocytosis 7.0, hemoglobin 14.4 Slight hyponatremia at 128, creatinine at baseline 1.7 to, mag 1.8 BNP 511 - highest it has been in our system Chest x-ray shows no evidence of acute cardiopulmonary disease KG shows a heart rate of 70, accelerated junctional rhythm with right bundle branch block QTc 383 Patient was given Lasix 40 mg IV 20 mEq potassium given Admission was requested for acute respiratory failure with hypoxia most likely secondary to CHF  Assessment and Plan: * Acute respiratory failure with hypoxia ([place])- (present on admission) O2 sats down to 87% No O2 requirement at baseline Normalized on 3 L nasal cannula Continue oxygen supplementation, weaning off as tolerated Most likely secondary to CHF exacerbation- see below Low suspicion for pneumonia as there is no leukocytosis, no fever, no pneumonia finding on chest x-ray Patient has no chest pain, no changes on EKG-troponin not indicated at this time Patient is not tachycardic, blood pressure stable, peripheral edema is symmetrical, very low suspicion for clot and D-dimer if patient becomes tachycardic or hypotensive, or  suspicion increases for PE Repeat chest x-ray in the a.m. Monitor  CHF exacerbation (HCC) Acute congestive heart failure exacerbation Major Criteris orthopnea,  Cardiomegaly Minor criteria BL leg edem, /dyspnea on exertion IV diuresis initiated with 40 mg IV Lasix, continue with 60 mg IV Lasix twice daily Last Echo EF 20 to 25%, ICD in place, update echo Daily weights, fluid restriction, strict Is and Os Continue Coreg, losartan, Crestor for medical optimization   Type 2  diabetes mellitus without complication ([place]) Patient takes 18 units of basal insulin at home Continue reduced dose, 10 units, basal insulin while in the hospital Sliding scale coverage Carb modified diet  Atrial fibrillation ([place])- (present on admission) Continue Eliquis and Coreg Rate well controlled at admission Monitor on telemetry  Hyperlipidemia- (present on admission) Continue Crestor Continue to monitor  HTN (hypertension)- (present on admission) Continue hydralazine, Coreg, losartan Blood pressure well controlled at admission 133/62 Continue to monitor       Advance Care Planning:   Code Status: Full Code   Consults: TOC  Family Communication: No family at bedside  Severity of Illness: The appropriate patient status for this patient is INPATIENT. Inpatient status is judged to be reasonable and necessary in order to provide the required intensity of service to ensure the patient's safety. The patient's presenting symptoms, physical exam findings, and initial radiographic and laboratory data in the context of their chronic comorbidities is felt to place them at high risk for further clinical deterioration. Furthermore, it is not anticipated that the patient will be medically stable for discharge from the hospital within 2 midnights of admission.   * I certify that at the point of admission it is my clinical judgment that the patient will require inpatient hospital care spanning beyond 2 midnights  from the point of admission due to high intensity of service, high risk for further deterioration and high frequency of surveillance required.*  Author: Rolla Plate, DO 10/15/2021 12:53 AM  For on call review [email or web address].

## 2021-10-15 NOTE — Assessment & Plan Note (Signed)
1. Acute congestive heart failure exacerbation 1. Major Criteris orthopnea,  Cardiomegaly 2. Minor criteria BL leg edem, /dyspnea on exertion 3. IV diuresis initiated with 40 mg IV Lasix, continue with 60 mg IV Lasix twice daily 4. Last Echo EF 20 to 25%, ICD in place, update echo 5. Daily weights, fluid restriction, strict Is and Os 6. Continue Coreg, losartan, Crestor for medical optimization

## 2021-10-15 NOTE — Assessment & Plan Note (Signed)
-   Continue Crestor -Continue to monitor 

## 2021-10-16 DIAGNOSIS — I5023 Acute on chronic systolic (congestive) heart failure: Secondary | ICD-10-CM | POA: Diagnosis present

## 2021-10-16 DIAGNOSIS — Z95810 Presence of automatic (implantable) cardiac defibrillator: Secondary | ICD-10-CM

## 2021-10-16 DIAGNOSIS — N1832 Chronic kidney disease, stage 3b: Secondary | ICD-10-CM

## 2021-10-16 DIAGNOSIS — E7800 Pure hypercholesterolemia, unspecified: Secondary | ICD-10-CM

## 2021-10-16 DIAGNOSIS — I25700 Atherosclerosis of coronary artery bypass graft(s), unspecified, with unstable angina pectoris: Secondary | ICD-10-CM

## 2021-10-16 DIAGNOSIS — J123 Human metapneumovirus pneumonia: Secondary | ICD-10-CM | POA: Diagnosis present

## 2021-10-16 LAB — RESPIRATORY PANEL BY PCR

## 2021-10-16 LAB — CBC WITH DIFFERENTIAL/PLATELET
Abs Immature Granulocytes: 0.02 10*3/uL (ref 0.00–0.07)
Basophils Absolute: 0 10*3/uL (ref 0.0–0.1)
Basophils Relative: 0 %
Eosinophils Absolute: 0.1 10*3/uL (ref 0.0–0.5)
Eosinophils Relative: 1 %
HCT: 43.4 % (ref 39.0–52.0)
Hemoglobin: 14.3 g/dL (ref 13.0–17.0)
Immature Granulocytes: 0 %
Lymphocytes Relative: 21 %
Lymphs Abs: 1.2 10*3/uL (ref 0.7–4.0)
MCH: 31.7 pg (ref 26.0–34.0)
MCHC: 32.9 g/dL (ref 30.0–36.0)
MCV: 96.2 fL (ref 80.0–100.0)
Monocytes Absolute: 0.7 10*3/uL (ref 0.1–1.0)
Monocytes Relative: 11 %
Neutro Abs: 3.9 10*3/uL (ref 1.7–7.7)
Neutrophils Relative %: 67 %
Platelets: 133 10*3/uL — ABNORMAL LOW (ref 150–400)
RBC: 4.51 MIL/uL (ref 4.22–5.81)
RDW: 14.4 % (ref 11.5–15.5)
WBC: 5.9 10*3/uL (ref 4.0–10.5)
nRBC: 0 % (ref 0.0–0.2)

## 2021-10-16 LAB — GLUCOSE, CAPILLARY
Glucose-Capillary: 83 mg/dL (ref 70–99)
Glucose-Capillary: 119 mg/dL — ABNORMAL HIGH (ref 70–99)
Glucose-Capillary: 106 mg/dL — ABNORMAL HIGH (ref 70–99)
Glucose-Capillary: 181 mg/dL — ABNORMAL HIGH (ref 70–99)
Glucose-Capillary: 227 mg/dL — ABNORMAL HIGH (ref 70–99)

## 2021-10-16 LAB — LIPID PANEL
Cholesterol: 133 mg/dL (ref 0–200)
HDL: 32 mg/dL — ABNORMAL LOW (ref >40)
LDL Cholesterol: 84 mg/dL (ref 0–99)
Total CHOL/HDL Ratio: 4.2 RATIO
Triglycerides: 83 mg/dL (ref <150)
VLDL: 17 mg/dL (ref 0–40)

## 2021-10-16 LAB — COMPREHENSIVE METABOLIC PANEL
ALT: 28 U/L (ref 0–44)
AST: 44 U/L — ABNORMAL HIGH (ref 15–41)
Albumin: 3.6 g/dL (ref 3.5–5.0)
Alkaline Phosphatase: 67 U/L (ref 38–126)
Anion gap: 13 (ref 5–15)
BUN: 26 mg/dL — ABNORMAL HIGH (ref 8–23)
CO2: 29 mmol/L (ref 22–32)
Calcium: 9.1 mg/dL (ref 8.9–10.3)
Chloride: 94 mmol/L — ABNORMAL LOW (ref 98–111)
Creatinine, Ser: 1.71 mg/dL — ABNORMAL HIGH (ref 0.61–1.24)
GFR, Estimated: 39 mL/min — ABNORMAL LOW (ref >60)
Glucose, Bld: 76 mg/dL (ref 70–99)
Potassium: 4 mmol/L (ref 3.5–5.1)
Sodium: 136 mmol/L (ref 135–145)
Total Bilirubin: 1 mg/dL (ref 0.3–1.2)
Total Protein: 6.8 g/dL (ref 6.5–8.1)

## 2021-10-16 LAB — PHOSPHORUS: Phosphorus: 4.1 mg/dL (ref 2.5–4.6)

## 2021-10-16 LAB — MAGNESIUM: Magnesium: 1.8 mg/dL (ref 1.7–2.4)

## 2021-10-16 MED ORDER — METHYLPREDNISOLONE SODIUM SUCC 40 MG IJ SOLR
40.0000 mg | Freq: Every day | INTRAMUSCULAR | Status: DC
  Administered 2021-10-16 – 2021-10-18 (×3): 40 mg via INTRAVENOUS
  Filled 2021-10-16 (×3): qty 1

## 2021-10-16 MED ORDER — IPRATROPIUM-ALBUTEROL 0.5-2.5 (3) MG/3ML IN SOLN
3.0000 mL | Freq: Three times a day (TID) | RESPIRATORY_TRACT | Status: DC
  Administered 2021-10-16 – 2021-10-18 (×7): 3 mL via RESPIRATORY_TRACT
  Filled 2021-10-16 (×7): qty 3

## 2021-10-16 MED ORDER — INSULIN ASPART 100 UNIT/ML IJ SOLN
0.0000 [IU] | INTRAMUSCULAR | Status: DC
  Administered 2021-10-16: 2 [IU] via SUBCUTANEOUS
  Administered 2021-10-16: 3 [IU] via SUBCUTANEOUS
  Administered 2021-10-17: 1 [IU] via SUBCUTANEOUS
  Administered 2021-10-17: 5 [IU] via SUBCUTANEOUS
  Administered 2021-10-17 (×2): 3 [IU] via SUBCUTANEOUS
  Administered 2021-10-18: 1 [IU] via SUBCUTANEOUS
  Administered 2021-10-18 (×2): 2 [IU] via SUBCUTANEOUS
  Administered 2021-10-18: 3 [IU] via SUBCUTANEOUS

## 2021-10-16 MED ORDER — DM-GUAIFENESIN ER 30-600 MG PO TB12
1.0000 | ORAL_TABLET | Freq: Two times a day (BID) | ORAL | Status: DC
  Administered 2021-10-16 – 2021-10-18 (×5): 1 via ORAL
  Filled 2021-10-16 (×5): qty 1

## 2021-10-16 NOTE — Progress Notes (Signed)
PROGRESS NOTE    Wayne Gordon  [id number] DOB: 08-May-1934 DOA: 10/14/2021 PCP: Adaline Sill, NP     Brief Narrative:  88 y.o. WM PMHx Atrial fibrillation, CAD, Ischemic cardiomyopathy (EF 25%) ICD placement Bi[employer]ik, Dr. Lovena Le 03/28/12, HTN, HLD, CKD stage IIIb (baseline Cr1.7-2.0), DM type II hold with hyperglycemia, GERD,   Presents to ED with chief complaint of dyspnea.  Patient reports he has dyspnea that started 2 days ago and has been getting worse since it started.  He has associated cough productive of clear sputum, and wheeze.  Patient reports orthopnea and bilateral lower extremity edema as well.  Patient had a 2 pound weight gain on Wednesday, and a 3 pound weight gain on Thursday.  He takes 40 mg of Lasix daily, but increased his Lasix to 100 mg on the day of presentation.  He did not have an increase in urine output.  He reports in fact he has noticed a decrease in urine output from his baseline.  Patient reports no history of COPD, so he has no inhaler use.  That he could attempt to use for his wheezing. Patient denies any fevers, chest pain, palpitations.  Patient denies any rashes, myalgias, diarrhea, constipation, dysuria, hematuria.  Patient has no other complaints at this time.     Subjective: 2/18 A/O x4, positive acute respiratory distress with hypoxia.   Assessment & Plan: Covid vaccination; 2/3 vaccinated   Principal Problem:   Acute respiratory failure with hypoxia ([place]) Active Problems:   HTN (hypertension)   Hyperlipidemia   Atrial fibrillation (HCC)   Type 2 diabetes mellitus without complication (HCC)   CHF exacerbation (HCC)   CAD (coronary artery disease) of artery bypass graft   History of implantable cardioverter-defibrillator (ICD) placement   Benign essential HTN   Chronic kidney disease, stage 3b (HCC)   Human metapneumovirus pneumonia   Acute on chronic systolic CHF (congestive heart failure) (HCC)    Acute respiratory failure with  hypoxia/positive metapneumovirus pneumonia (present on admission) -O2 sats down to 87% -No O2 requirement at baseline -Continuous pulse ox monitoring.  Titrate O2 to maintain SPO2 > 92%  -DuoNeb TID - Flutter valve - Incentive spirometry - Solu-Medrol 40 mg daily - Mucinex DM BID -May be secondary to CHF exacerbation, or given his exposure to ill patients viral infection. -Low suspicion for bacterial pneumonia as there is no leukocytosis, no fever, no pneumonia finding on chest x-ray -Patient has no chest pain, no changes on EKG-troponin not indicated at this time -Patient is not tachycardic, blood pressure stable, peripheral edema is symmetrical, very low suspicion for clot and D-dimer if patient becomes tachycardic or hypotensive, or suspicion increases for PE -Repeat chest x-ray in the a.m. -2/17: Positive metapneumovirus see results below    Acute on chronic systolic CHF - Longstanding cardiomyopathy with EF 20-25% in 2013 and similar results by repeat echo in 08/2017 but also noted to have moderate pulmonary HTN at that time. Admitted with worsening dyspnea on exertion and an associated 5 lb weight gain on his home scales.  - BNP elevated to 511 on admission repeat echo is pending. He received 40 mg of IV Lasix upon admission and is scheduled to start 60 mg twice daily today. Renal function remains stable and would continue with IV Lasix dosing. Anticipate he will require IV diuresis for 1-2 additional days as his baseline weight is around 235 lbs and at 242 lbs today. He was on Lasix 40mg  daily prior to admission and  will likely require 60mg  daily or switching to Torsemide. We also reviewed the importance of monitoring his sodium intake as he eats at [employer] on a daily basis. - Remains on Coreg, Losartan and Hydralazine. He had angioedema with ACE-I in the past and I suspect this is why he was never switched to [place]. If his EF remains reduced, could add [place] or [place]  prior to discharge. Spironolactone could be considered as well but would be cautious given his renal function. -2/17 Echocardiogram shows that patient's cardiac function overall there is no significant decrease see results below -Strict in and out -1.1 L, 2/18 - Daily weight Filed Weights   10/14/21 2300 10/15/21 0108 10/16/21 0500  Weight: 110.8 kg 110 kg 106.8 kg      CAD - He is s/p CABG in 2013. He reports dyspnea on exertion in the setting of fluid overload but no symptoms prior to this and no recent chest pain.  -See CHF  Mitral valve repair - He underwent MV repair at that time of CABG in 2013. Echo in 2019 showed mild MR and mild to moderate TR. Repeat echocardiogram pending.    VT and sinus node dysfunction - He is s/p ICD placement in 2013 with upgrade to Biotronik Bi-V ICD in 09/2020. Followed by Dr. Lovena Le as an outpatient.    Permanent Atrial fibrillation -2/18 currently NSR - Eliquis 2.5 mg BID -See CHF  Essential HTN (hypertension)- (present on admission) -See CHF  CKD stage 3B (baseline Cr 1.9 - 2.1 in 2022).  -At 1.72 on admission and stable at 1.70 today. Continue to follow with diuresis.  Lab Results  Component Value Date   CREATININE 1.71 (H) 10/16/2021   CREATININE 1.70 (H) 10/15/2021   CREATININE 1.72 (H) 10/14/2021   CREATININE 2.02 (H) 10/12/2020   CREATININE 1.93 (H) 10/01/2017  -At baseline   DM type II uncontrolled with complication on long-term insulin  -Patient takes 18 units of basal insulin at home -2/18 continue continue reduced dose, Lantus 10 units,while in the hospital -2/18 decrease to sensitive SSI    HLD(present on admission) - Crestor 5 mg - 2/18 lipid panel pending   Physical Exam:  General: A/O x4, positive acute respiratory distress Eyes: negative scleral hemorrhage, negative anisocoria, negative icterus ENT: Negative Runny nose, negative gingival bleeding, Neck:  Negative scars, masses, torticollis, lymphadenopathy,  JVD Lungs: decreased bilaterally without wheezes or crackles Cardiovascular: Regular rate and rhythm without murmur gallop or rub normal S1 and S2 Abdomen: negative abdominal pain, nondistended, positive soft, bowel sounds, no rebound, no ascites, no appreciable mass Extremities: No significant cyanosis, clubbing, or edema bilateral lower extremities Skin: Negative rashes, lesions, ulcers Psychiatric:  Negative depression, negative anxiety, negative fatigue, negative mania  Central nervous system:  Cranial nerves II through XII intact, tongue/uvula midline, all extremities muscle strength 5/5, sensation intact throughout, negative dysarthria, negative expressive aphasia, negative receptive aphasia.       DVT prophylaxis: Eliquis Code Status: Full Family Communication: 2/17 wife at bedside for discussion of plan of care all questions answered Status is: Inpatient    Dispo: The patient is from: Home              Anticipated d/c is to: Home              Anticipated d/c date is: 3 days              Patient currently is not medically stable to d/c.      Consultants:  Cardiology Dr. Harrington Challenger    Procedures/Significant Events:  2/17 Echocardiogram; left Ventricle: Septal flattening consistent with RV volume / pressure  overload. Diffuse hypokinesis, inferior , basal inferoseptal akinesis.  -LVEF=25%%. The left ventricle has severely decreased function.  -The left ventricular internal cavity size was severely dilated. There is mild left ventricular hypertrophy.  -Left ventricular diastolic parameters are indeterminate.  Right Ventricle: The right ventricular size is moderately enlarged. Right ventricular systolic  function is moderately reduced.  -Moderately elevated PASP estimated right ventricular systolic pressure is [id number] mmHg.  Left Atrium: Left atrial size was moderately dilated.    I have personally reviewed and interpreted all radiology studies and my findings are as  above.  VENTILATOR SETTINGS: Nasal cannula 2/18 Flow 3 L/min SPO2 94%  Cultures   Antimicrobials: 2/16 influenza A/B negative 2/17 respiratory virus panel positive metapneumovirus   Devices    LINES / TUBES:      Continuous Infusions:   Objective: Vitals:   10/16/21 0809 10/16/21 0907 10/16/21 1411 10/16/21 1523  BP:   113/62   Pulse: 70  70   Resp:      Temp:   98.1 F (36.7 C)   TempSrc:      SpO2:  97% 92% 96%  Weight:      Height:        Intake/Output Summary (Last 24 hours) at 10/16/2021 1548 Last data filed at 10/16/2021 1500 Gross per 24 hour  Intake 960 ml  Output 1600 ml  Net -640 ml   Filed Weights   10/14/21 2300 10/15/21 0108 10/16/21 0500  Weight: 110.8 kg 110 kg 106.8 kg    Examination:  General: A/O x4, positive acute respiratory distress Eyes: negative scleral hemorrhage, negative anisocoria, negative icterus ENT: Negative Runny nose, negative gingival bleeding, Neck:  Negative scars, masses, torticollis, lymphadenopathy, JVD Lungs: decreased breath sounds bilaterally, positive inspiratory/expiratory wheezes Cardiovascular: Regular rate and rhythm without murmur gallop or rub normal S1 and S2 Abdomen: negative abdominal pain, nondistended, positive soft, bowel sounds, no rebound, no ascites, no appreciable mass Extremities: No significant cyanosis, clubbing, or edema bilateral lower extremities Skin: Negative rashes, lesions, ulcers Psychiatric:  Negative depression, negative anxiety, negative fatigue, negative mania  Central nervous system:  Cranial nerves II through XII intact, tongue/uvula midline, all extremities muscle strength 5/5, sensation intact throughout, negative dysarthria, negative expressive aphasia, negative receptive aphasia.  .     Data Reviewed: Care during the described time interval was provided by me .  I have reviewed this patient's available data, including medical history, events of note, physical examination,  and all test results as part of my evaluation.  CBC: Recent Labs  Lab 10/14/21 2219 10/15/21 0431 10/16/21 0643  WBC 7.0 5.3 5.9  NEUTROABS  --  3.7 3.9  HGB 14.4 13.8 14.3  HCT 44.9 40.8 43.4  MCV 94.1 94.4 96.2  PLT 150 131* [id number]*   Basic Metabolic Panel: Recent Labs  Lab 10/14/21 2219 10/15/21 0431 10/16/21 0643  NA 128* 134* 136  K 3.9 3.6 4.0  CL 95* 97* 94*  CO2 22 25 29   GLUCOSE 151* 94 76  BUN 22 22 26*  CREATININE 1.72* 1.70* 1.71*  CALCIUM 8.8* 8.7* 9.1  MG 1.8 1.8 1.8  PHOS  --   --  4.1   GFR: Estimated Creatinine Clearance: 42.3 mL/min (A) (by C-G formula based on SCr of 1.71 mg/dL (H)). Liver Function Tests: Recent Labs  Lab 10/15/21 0431 10/16/21 0643  AST 34 44*  ALT 26 28  ALKPHOS 62 67  BILITOT 0.8 1.0  PROT 5.9* 6.8  ALBUMIN 3.5 3.6   No results for input(s): LIPASE, AMYLASE in the last 168 hours. No results for input(s): AMMONIA in the last 168 hours. Coagulation Profile: No results for input(s): INR, PROTIME in the last 168 hours. Cardiac Enzymes: No results for input(s): CKTOTAL, CKMB, CKMBINDEX, TROPONINI in the last 168 hours. BNP (last 3 results) No results for input(s): PROBNP in the last 8760 hours. HbA1C: Recent Labs    10/15/21 0431  HGBA1C 7.5*   CBG: Recent Labs  Lab 10/15/21 1615 10/15/21 2057 10/16/21 0739 10/16/21 1103 10/16/21 1144  GLUCAP 133* 137* 83 119* 106*   Lipid Profile: Recent Labs    10/16/21 0643  CHOL 133  HDL 32*  LDLCALC 84  TRIG 83  CHOLHDL 4.2   Thyroid Function Tests: Recent Labs    10/15/21 0431  TSH 1.817   Anemia Panel: No results for input(s): VITAMINB12, FOLATE, FERRITIN, TIBC, IRON, RETICCTPCT in the last 72 hours. Sepsis Labs: No results for input(s): PROCALCITON, LATICACIDVEN in the last 168 hours.  Recent Results (from the past 240 hour(s))  Resp Panel by RT-PCR (Flu A&B, Covid) Nasopharyngeal Swab     Status: None   Collection Time: 10/14/21 10:50 PM   Specimen:  Nasopharyngeal Swab; Nasopharyngeal(NP) swabs in vial transport medium  Result Value Ref Range Status   SARS Coronavirus 2 by RT PCR NEGATIVE NEGATIVE Final    Comment: (NOTE) SARS-CoV-2 target nucleic acids are NOT DETECTED.  The SARS-CoV-2 RNA is generally detectable in upper respiratory specimens during the acute phase of infection. The lowest concentration of SARS-CoV-2 viral copies this assay can detect is 138 copies/mL. A negative result does not preclude SARS-Cov-2 infection and should not be used as the sole basis for treatment or other patient management decisions. A negative result may occur with  improper specimen collection/handling, submission of specimen other than nasopharyngeal swab, presence of viral mutation(s) within the areas targeted by this assay, and inadequate number of viral copies(<138 copies/mL). A negative result must be combined with clinical observations, patient history, and epidemiological information. The expected result is Negative.  Fact Sheet for Patients:  EntrepreneurPulse.com.au  Fact Sheet for Healthcare Providers:  IncredibleEmployment.be  This test is no t yet approved or cleared by the [place] FDA and  has been authorized for detection and/or diagnosis of SARS-CoV-2 by FDA under an Emergency Use Authorization (EUA). This EUA will remain  in effect (meaning this test can be used) for the duration of the COVID-19 declaration under Section 564(b)(1) of the Act, 21 U.S.C.section 360bbb-3(b)(1), unless the authorization is terminated  or revoked sooner.       Influenza A by PCR NEGATIVE NEGATIVE Final   Influenza B by PCR NEGATIVE NEGATIVE Final    Comment: (NOTE) The Xpert Xpress SARS-CoV-2/FLU/RSV plus assay is intended as an aid in the diagnosis of influenza from Nasopharyngeal swab specimens and should not be used as a sole basis for treatment. Nasal washings and aspirates are unacceptable for  Xpert Xpress SARS-CoV-2/FLU/RSV testing.  Fact Sheet for Patients: EntrepreneurPulse.com.au  Fact Sheet for Healthcare Providers: IncredibleEmployment.be  This test is not yet approved or cleared by the [place] FDA and has been authorized for detection and/or diagnosis of SARS-CoV-2 by FDA under an Emergency Use Authorization (EUA). This EUA will remain in effect (meaning this test can be used) for the duration of the COVID-19 declaration under Section 564(b)(1) of the Act,  21 U.S.C. section 360bbb-3(b)(1), unless the authorization is terminated or revoked.  Performed at [facility], [address], [place], [place] [address]   Respiratory (~20 pathogens) panel by PCR     Status: Abnormal   Collection Time: 10/15/21  3:23 PM   Specimen: Nasopharyngeal Swab; Respiratory  Result Value Ref Range Status   Adenovirus NOT DETECTED NOT DETECTED Final   Coronavirus 229E NOT DETECTED NOT DETECTED Final    Comment: (NOTE) The Coronavirus on the Respiratory Panel, DOES NOT test for the novel  Coronavirus (2019 nCoV)    Coronavirus HKU1 NOT DETECTED NOT DETECTED Final   Coronavirus NL63 NOT DETECTED NOT DETECTED Final   Coronavirus OC43 NOT DETECTED NOT DETECTED Final   Metapneumovirus DETECTED (A) NOT DETECTED Final   Rhinovirus / Enterovirus NOT DETECTED NOT DETECTED Final   Influenza A NOT DETECTED NOT DETECTED Final   Influenza B NOT DETECTED NOT DETECTED Final   Parainfluenza Virus 1 NOT DETECTED NOT DETECTED Final   Parainfluenza Virus 2 NOT DETECTED NOT DETECTED Final   Parainfluenza Virus 3 NOT DETECTED NOT DETECTED Final   Parainfluenza Virus 4 NOT DETECTED NOT DETECTED Final   Respiratory Syncytial Virus NOT DETECTED NOT DETECTED Final   Bordetella pertussis NOT DETECTED NOT DETECTED Final   Bordetella Parapertussis NOT DETECTED NOT DETECTED Final   Chlamydophila pneumoniae NOT DETECTED NOT DETECTED Final   Mycoplasma pneumoniae NOT  DETECTED NOT DETECTED Final    Comment: Performed at [facility], [address] [place], [place] [address]         Radiology Studies: DG CHEST PORT 1 VIEW  Result Date: 10/15/2021 CLINICAL DATA:  Acute respiratory failure with hypoxia. EXAM: PORTABLE CHEST 1 VIEW COMPARISON:  October 14, 2021. FINDINGS: Stable cardiomegaly. Status post coronary bypass graft. Left-sided defibrillator is unchanged in position. Stable elevated right hemidiaphragm is noted with minimal right basilar subsegmental atelectasis. Left lung base is not included in field-of-view. Bony thorax is unremarkable. IMPRESSION: Stable elevated right hemidiaphragm with minimal right basilar subsegmental atelectasis. Left lung base is not included in field-of-view. Electronically Signed   By: Marijo Conception M.D.   On: 10/15/2021 06:41   DG Chest Portable 1 View  Result Date: 10/14/2021 CLINICAL DATA:  Shortness of breath EXAM: PORTABLE CHEST 1 VIEW COMPARISON:  10/12/2020 FINDINGS: Eventration of the right hemidiaphragm. Lungs are essentially clear. No pleural effusion or pneumothorax. Cardiomegaly. Left subclavian ICD. Postsurgical changes related to prior CABG. Median sternotomy. IMPRESSION: No evidence of acute cardiopulmonary disease. Electronically Signed   By: Julian Hy M.D.   On: 10/14/2021 22:42   ECHOCARDIOGRAM COMPLETE  Result Date: 10/15/2021    ECHOCARDIOGRAM REPORT   Patient Name:   SAMIE SEVER Date of Exam: 10/15/2021 Medical Rec #:  [id number]      Height:       76.0 in Accession #:    [id number]     Weight:       242.5 lb Date of Birth:  07-23-1934       BSA:          2.404 m Patient Age:    60 years       BP:           127/71 mmHg Patient Gender: M              HR:           70 bpm. Exam Location:  [facility] Procedure: 2D Echo, Cardiac Doppler, Color Doppler and  Intracardiac            Opacification Agent Indications:    CHF  History:        Patient has prior history of Echocardiogram examinations,  most                 recent 09/21/2017. CHF, Previous Myocardial Infarction and CAD,                 Defibrillator and Prior CABG, Arrythmias:Atrial Fibrillation and                 PVC, Signs/Symptoms:Syncope; Risk Factors:Hypertension, Diabetes                 and Dyslipidemia. Mitral Valve repair.  Sonographer:    Wenda Low Referring Phys: [id number] ASIA B [place]  Sonographer Comments: Image acquisition challenging due to respiratory motion. IMPRESSIONS  1. Septal flattening consistent with RV volume / pressure overload. Diffuse hypokinesis, inferior , basal inferoseptal akinesis . Left ventricular ejection fraction, by estimation, is 25%%. The left ventricle has severely decreased function. The left ventricular internal cavity size was severely dilated. There is mild left ventricular hypertrophy. Left ventricular diastolic parameters are indeterminate.  2. Right ventricular systolic function is moderately reduced. The right ventricular size is moderately enlarged. There is moderately elevated pulmonary artery systolic pressure.  3. Left atrial size was moderately dilated.  4. Right atrial size was mildly dilated.  5. Mild mitral valve regurgitation.  6. The aortic valve is tricuspid. Aortic valve regurgitation is trivial. Aortic valve sclerosis is present, with no evidence of aortic valve stenosis. FINDINGS  Left Ventricle: Septal flattening consistent with RV volume / pressure overload. Diffuse hypokinesis, inferior , basal inferoseptal akinesis. Left ventricular ejection fraction, by estimation, is 25%%. The left ventricle has severely decreased function.  Definity contrast agent was given IV to delineate the left ventricular endocardial borders. The left ventricular internal cavity size was severely dilated. There is mild left ventricular hypertrophy. Left ventricular diastolic parameters are indeterminate. Right Ventricle: The right ventricular size is moderately enlarged. Right vetricular wall  thickness was not assessed. Right ventricular systolic function is moderately reduced. There is moderately elevated pulmonary artery systolic pressure. The tricuspid regurgitant velocity is 3.15 m/s, and with an assumed right atrial pressure of 15 mmHg, the estimated right ventricular systolic pressure is [id number] mmHg. Left Atrium: Left atrial size was moderately dilated. Right Atrium: Right atrial size was mildly dilated. Pericardium: There is no evidence of pericardial effusion. Mitral Valve: There is mild thickening of the mitral valve leaflet(s). Mild mitral annular calcification. Mild mitral valve regurgitation. MV peak gradient, 9.7 mmHg. The mean mitral valve gradient is 2.0 mmHg. Tricuspid Valve: The tricuspid valve is normal in structure. Tricuspid valve regurgitation is mild. Aortic Valve: The aortic valve is tricuspid. Aortic valve regurgitation is trivial. Aortic valve sclerosis is present, with no evidence of aortic valve stenosis. Aortic valve mean gradient measures 2.0 mmHg. Aortic valve peak gradient measures 4.8 mmHg. Aortic valve area, by VTI measures 2.72 cm. Pulmonic Valve: The pulmonic valve was normal in structure. Pulmonic valve regurgitation is not visualized. Aorta: The aortic root is normal in size and structure. IAS/Shunts: No atrial level shunt detected by color flow Doppler. Additional Comments: A device lead is visualized.  LEFT VENTRICLE PLAX 2D LVIDd:         6.30 cm   Diastology LVIDs:         5.60 cm   LV e' medial:   8.05 cm/s  LV PW:         1.40 cm   LV E/e' medial: 16.6 LV IVS:        1.40 cm LVOT diam:     2.10 cm LV SV:         57 LV SV Index:   24 LVOT Area:     3.46 cm  RIGHT VENTRICLE RV Basal diam:  4.75 cm RV Mid diam:    3.10 cm TAPSE (M-mode): 1.6 cm LEFT ATRIUM              Index        RIGHT ATRIUM           Index LA diam:        5.40 cm  2.25 cm/m   RA Area:     22.60 cm LA Vol (A2C):   115.0 ml 47.83 ml/m  RA Volume:   69.80 ml  29.03 ml/m LA Vol (A4C):   110.0 ml  45.75 ml/m LA Biplane Vol: 115.0 ml 47.83 ml/m  AORTIC VALVE                    PULMONIC VALVE AV Area (Vmax):    2.68 cm     PV Vmax:       0.66 m/s AV Area (Vmean):   2.40 cm     PV Peak grad:  1.8 mmHg AV Area (VTI):     2.72 cm AV Vmax:           109.00 cm/s AV Vmean:          66.400 cm/s AV VTI:            0.209 m AV Peak Grad:      4.8 mmHg AV Mean Grad:      2.0 mmHg LVOT Vmax:         84.20 cm/s LVOT Vmean:        46.100 cm/s LVOT VTI:          0.164 m LVOT/AV VTI ratio: 0.78  AORTA Ao Root diam: 3.10 cm MITRAL VALVE                TRICUSPID VALVE MV Area (PHT): 3.31 cm     TR Peak grad:   39.7 mmHg MV Area VTI:   1.76 cm     TR Vmax:        315.00 cm/s MV Peak grad:  9.7 mmHg MV Mean grad:  2.0 mmHg     SHUNTS MV Vmax:       1.56 m/s     Systemic VTI:  0.16 m MV Vmean:      60.5 cm/s    Systemic Diam: 2.10 cm MV Decel Time: 229 msec MV E velocity: 134.00 cm/s Dorris Carnes MD Electronically signed by Dorris Carnes MD Signature Date/Time: 10/15/2021/4:49:27 PM    Final         Scheduled Meds:  apixaban  2.5 mg Oral BID   carvedilol  12.5 mg Oral BID WC   dextromethorphan-guaiFENesin  1 tablet Oral BID   furosemide  60 mg Intravenous BID   gabapentin  100 mg Oral TID   insulin aspart  0-15 Units Subcutaneous TID WC   insulin aspart  0-5 Units Subcutaneous QHS   insulin detemir  10 Units Subcutaneous QHS   ipratropium-albuterol  3 mL Nebulization TID   losartan  25 mg Oral Daily   melatonin  6 mg Oral QHS  methylPREDNISolone (SOLU-MEDROL) injection  40 mg Intravenous Daily   pantoprazole  40 mg Oral Daily   rosuvastatin  5 mg Oral QPM   Continuous Infusions:   LOS: 2 days    Time spent:40 min    Cicily Bonano, Geraldo Docker, MD [facility]   If 7PM-7AM, please contact night-coverage 10/16/2021, 3:48 PM

## 2021-10-17 LAB — CBC WITH DIFFERENTIAL/PLATELET
Abs Immature Granulocytes: 0.02 10*3/uL (ref 0.00–0.07)
Basophils Absolute: 0 10*3/uL (ref 0.0–0.1)
Basophils Relative: 0 %
Eosinophils Absolute: 0 10*3/uL (ref 0.0–0.5)
Eosinophils Relative: 0 %
HCT: 38.5 % — ABNORMAL LOW (ref 39.0–52.0)
Hemoglobin: 13 g/dL (ref 13.0–17.0)
Immature Granulocytes: 0 %
Lymphocytes Relative: 18 %
Lymphs Abs: 1.1 10*3/uL (ref 0.7–4.0)
MCH: 32 pg (ref 26.0–34.0)
MCHC: 33.8 g/dL (ref 30.0–36.0)
MCV: 94.8 fL (ref 80.0–100.0)
Monocytes Absolute: 0.4 10*3/uL (ref 0.1–1.0)
Monocytes Relative: 7 %
Neutro Abs: 4.3 10*3/uL (ref 1.7–7.7)
Neutrophils Relative %: 75 %
Platelets: 121 10*3/uL — ABNORMAL LOW (ref 150–400)
RBC: 4.06 MIL/uL — ABNORMAL LOW (ref 4.22–5.81)
RDW: 14 % (ref 11.5–15.5)
WBC: 5.8 10*3/uL (ref 4.0–10.5)
nRBC: 0 % (ref 0.0–0.2)

## 2021-10-17 LAB — GLUCOSE, CAPILLARY
Glucose-Capillary: 156 mg/dL — ABNORMAL HIGH (ref 70–99)
Glucose-Capillary: 128 mg/dL — ABNORMAL HIGH (ref 70–99)
Glucose-Capillary: 238 mg/dL — ABNORMAL HIGH (ref 70–99)
Glucose-Capillary: 230 mg/dL — ABNORMAL HIGH (ref 70–99)
Glucose-Capillary: 266 mg/dL — ABNORMAL HIGH (ref 70–99)

## 2021-10-17 LAB — COMPREHENSIVE METABOLIC PANEL
ALT: 26 U/L (ref 0–44)
AST: 38 U/L (ref 15–41)
Albumin: 3.2 g/dL — ABNORMAL LOW (ref 3.5–5.0)
Alkaline Phosphatase: 56 U/L (ref 38–126)
Anion gap: 7 (ref 5–15)
BUN: 37 mg/dL — ABNORMAL HIGH (ref 8–23)
CO2: 32 mmol/L (ref 22–32)
Calcium: 8.4 mg/dL — ABNORMAL LOW (ref 8.9–10.3)
Chloride: 94 mmol/L — ABNORMAL LOW (ref 98–111)
Creatinine, Ser: 1.8 mg/dL — ABNORMAL HIGH (ref 0.61–1.24)
GFR, Estimated: 36 mL/min — ABNORMAL LOW (ref >60)
Glucose, Bld: 157 mg/dL — ABNORMAL HIGH (ref 70–99)
Potassium: 3.8 mmol/L (ref 3.5–5.1)
Sodium: 133 mmol/L — ABNORMAL LOW (ref 135–145)
Total Bilirubin: 0.9 mg/dL (ref 0.3–1.2)
Total Protein: 5.9 g/dL — ABNORMAL LOW (ref 6.5–8.1)

## 2021-10-17 LAB — LIPID PANEL
Cholesterol: 120 mg/dL (ref 0–200)
HDL: 28 mg/dL — ABNORMAL LOW (ref >40)
LDL Cholesterol: 78 mg/dL (ref 0–99)
Total CHOL/HDL Ratio: 4.3 RATIO
Triglycerides: 69 mg/dL (ref <150)
VLDL: 14 mg/dL (ref 0–40)

## 2021-10-17 LAB — MAGNESIUM: Magnesium: 1.8 mg/dL (ref 1.7–2.4)

## 2021-10-17 LAB — PHOSPHORUS: Phosphorus: 4.4 mg/dL (ref 2.5–4.6)

## 2021-10-17 MED ORDER — SALINE SPRAY 0.65 % NA SOLN
1.0000 | NASAL | Status: DC | PRN
  Administered 2021-10-17: 1 via NASAL
  Filled 2021-10-17: qty 44

## 2021-10-17 MED ORDER — ROSUVASTATIN CALCIUM 10 MG PO TABS
10.0000 mg | ORAL_TABLET | Freq: Every evening | ORAL | Status: DC
Start: 2021-10-17 — End: 2021-10-18
  Administered 2021-10-17: 10 mg via ORAL
  Filled 2021-10-17: qty 1

## 2021-10-17 MED ORDER — INSULIN DETEMIR 100 UNIT/ML ~~LOC~~ SOLN
18.0000 [IU] | Freq: Every day | SUBCUTANEOUS | Status: DC
  Administered 2021-10-17: 18 [IU] via SUBCUTANEOUS
  Filled 2021-10-17 (×2): qty 0.18

## 2021-10-17 MED ORDER — DAPAGLIFLOZIN PROPANEDIOL 5 MG PO TABS
5.0000 mg | ORAL_TABLET | Freq: Every day | ORAL | Status: DC
  Administered 2021-10-17: 5 mg via ORAL
  Filled 2021-10-17 (×4): qty 1

## 2021-10-17 MED ORDER — FUROSEMIDE 40 MG PO TABS
40.0000 mg | ORAL_TABLET | Freq: Every day | ORAL | Status: DC
  Administered 2021-10-18: 40 mg via ORAL
  Filled 2021-10-17: qty 1

## 2021-10-17 NOTE — Progress Notes (Signed)
PROGRESS NOTE    Wayne Gordon  [id number] DOB: Jan 26, 1934 DOA: 10/14/2021 PCP: Adaline Sill, NP     Brief Narrative:  88 y.o. WM PMHx Atrial fibrillation, CAD, Ischemic cardiomyopathy (EF 25%) ICD placement Biotronik, Dr. Lovena Le 03/28/12, HTN, HLD, CKD stage IIIb (baseline Cr1.7-2.0), DM type II hold with hyperglycemia, GERD,   Presents to ED with chief complaint of dyspnea.  Patient reports he has dyspnea that started 2 days ago and has been getting worse since it started.  He has associated cough productive of clear sputum, and wheeze.  Patient reports orthopnea and bilateral lower extremity edema as well.  Patient had a 2 pound weight gain on Wednesday, and a 3 pound weight gain on Thursday.  He takes 40 mg of Lasix daily, but increased his Lasix to 100 mg on the day of presentation.  He did not have an increase in urine output.  He reports in fact he has noticed a decrease in urine output from his baseline.  Patient reports no history of COPD, so he has no inhaler use.  That he could attempt to use for his wheezing. Patient denies any fevers, chest pain, palpitations.  Patient denies any rashes, myalgias, diarrhea, constipation, dysuria, hematuria.  Patient has no other complaints at this time.     Subjective: 2/19 afebrile overnight A/O x4.  Negative SOB.  Patient very involved in his medication and home care now that he is feeling better.  Very knowledgeable   Assessment & Plan: Covid vaccination; 2/3 vaccinated   Principal Problem:   Acute respiratory failure with hypoxia ([place]) Active Problems:   HTN (hypertension)   Hyperlipidemia   Atrial fibrillation (HCC)   Type 2 diabetes mellitus without complication (HCC)   CHF exacerbation (HCC)   CAD (coronary artery disease) of artery bypass graft   History of implantable cardioverter-defibrillator (ICD) placement   Benign essential HTN   Chronic kidney disease, stage 3b (HCC)   Human metapneumovirus pneumonia   Acute on  chronic systolic CHF (congestive heart failure) (HCC)    Acute respiratory failure with hypoxia/positive metapneumovirus pneumonia (present on admission) -O2 sats down to 87% -No O2 requirement at baseline -Continuous pulse ox monitoring.  Titrate O2 to maintain SPO2 > 92%  -DuoNeb TID - Flutter valve - Incentive spirometry - Solu-Medrol 40 mg daily - Mucinex DM BID -May be secondary to CHF exacerbation, or given his exposure to ill patients viral infection. -Low suspicion for bacterial pneumonia as there is no leukocytosis, no fever, no pneumonia finding on chest x-ray -Patient has no chest pain, no changes on EKG-troponin not indicated at this time -Patient is not tachycardic, blood pressure stable, peripheral edema is symmetrical, very low suspicion for clot and D-dimer if patient becomes tachycardic or hypotensive, or suspicion increases for PE -Repeat chest x-ray in the a.m. -2/17: Positive metapneumovirus see results below    Acute on chronic systolic CHF - Longstanding cardiomyopathy with EF 20-25% in 2013 and similar results by repeat echo in 08/2017 but also noted to have moderate pulmonary HTN at that time. Admitted with worsening dyspnea on exertion and an associated 5 lb weight gain on his home scales.  - BNP elevated to 511 on admission. -Anticipate he will require IV diuresis for 1-2 additional days as his baseline weight is around 235 lbs(106.5 kg)  -2/19 at baseline weight and at 242 lbs today.  -2/19 decrease Lasix p.o. 40 mg daily  -Coreg 12.5 mg BID - 12/19 per patient was taken off  losartan by his cardiologist and given his low blood pressure we will discontinue -2/19 DC hydralazine 25 mg TID -2/19 per cardiology recommendations start Farxiga 5 mg daily we will have to monitor closely as his GFR is borderline -2/17 Echocardiogram shows that patient's cardiac function overall there is no significant decrease see results below -Strict in and out -2.0 L, 2/18 - Daily  weight Filed Weights   10/15/21 0108 10/16/21 0500 10/17/21 0531  Weight: 110 kg 106.8 kg 106.2 kg  -2/19 at base weight   CAD - He is s/p CABG in 2013. He reports dyspnea on exertion in the setting of fluid overload but no symptoms prior to this and no recent chest pain.  -See CHF  Mitral valve repair - He underwent MV repair at that time of CABG in 2013. Echo in 2019 showed mild MR and mild to moderate TR.   VT and sinus node dysfunction - He is s/p ICD placement in 2013 with upgrade to Biotronik Bi-V ICD in 09/2020. Followed by Dr. Ladona Ridgel as an outpatient.    Permanent Atrial fibrillation -2/18 currently NSR - Eliquis 2.5 mg BID -See CHF  Essential HTN (hypertension)- (present on admission) -See CHF  CKD stage 3B (baseline Cr 1.9 - 2.1 in 2022).  -At 1.72 on admission and stable at 1.70 today. Continue to follow with diuresis.  Lab Results  Component Value Date   CREATININE 1.80 (H) 10/17/2021   CREATININE 1.71 (H) 10/16/2021   CREATININE 1.70 (H) 10/15/2021   CREATININE 1.72 (H) 10/14/2021   CREATININE 2.02 (H) 10/12/2020  -At baseline   DM type II uncontrolled with complication on long-term insulin  - 2/19 DC sensitive SSI  -2/19 restart Levemir 18 units (home dose Lantus 18 units)   HLD(present on admission) - 2/18 LDL=78: Goal is LDL<70 -2/19 increased Crestor 10 mg  Goals of care - 2/19 PT/OT evaluate patient for SNF vs HH     physical Exam:  General: A/O x4, negative acute respiratory distress Eyes: negative scleral hemorrhage, negative anisocoria, negative icterus ENT: Negative Runny nose, negative gingival bleeding, Neck:  Negative scars, masses, torticollis, lymphadenopathy, JVD Lungs: decreased bibasilar breath sounds, positive mild expiratory wheezes  Cardiovascular: Regular rate and rhythm without murmur gallop or rub normal S1 and S2 Abdomen: negative abdominal pain, nondistended, positive soft, bowel sounds, no rebound, no ascites, no  appreciable mass Extremities: No significant cyanosis, clubbing, or edema bilateral lower extremities Skin: Negative rashes, lesions, ulcers Psychiatric:  Negative depression, negative anxiety, negative fatigue, negative mania  Central nervous system:  Cranial nerves II through XII intact, tongue/uvula midline, all extremities muscle strength 5/5, sensation intact throughout, negative dysarthria, negative expressive aphasia, negative receptive aphasia.       DVT prophylaxis: Eliquis Code Status: Full Family Communication: 2/18 wife at bedside for discussion of plan of care all questions answered Status is: Inpatient    Dispo: The patient is from: Home              Anticipated d/c is to: Home              Anticipated d/c date is: 1-2 days              Patient currently is not medically stable to d/c.      Consultants:  Cardiology Dr. Tenny Craw     Procedures/Significant Events:  2/17 Echocardiogram; left Ventricle: Septal flattening consistent with RV volume / pressure  overload. Diffuse hypokinesis, inferior , basal inferoseptal akinesis.  -LVEF=25%%. The left  ventricle has severely decreased function.  -The left ventricular internal cavity size was severely dilated. There is mild left ventricular hypertrophy.  -Left ventricular diastolic parameters are indeterminate.  Right Ventricle: The right ventricular size is moderately enlarged. Right ventricular systolic  function is moderately reduced.  -Moderately elevated PASP estimated right ventricular systolic pressure is 54.7 mmHg.  Left Atrium: Left atrial size was moderately dilated.    I have personally reviewed and interpreted all radiology studies and my findings are as above.  VENTILATOR SETTINGS: Nasal cannula 2/19 Flow 3 L/min SPO2 98%  Cultures   Antimicrobials: 2/16 influenza A/B negative 2/17 respiratory virus panel positive metapneumovirus   Devices    LINES / TUBES:      Continuous  Infusions:   Objective: Vitals:   10/16/21 1523 10/16/21 2036 10/16/21 2052 10/17/21 0531  BP:   117/70 108/66  Pulse:   70 70  Resp:   20 20  Temp:   98.2 F (36.8 C) 98.1 F (36.7 C)  TempSrc:   Oral Oral  SpO2: 96% 95% 96% 98%  Weight:    106.2 kg  Height:        Intake/Output Summary (Last 24 hours) at 10/17/2021 0758 Last data filed at 10/17/2021 0550 Gross per 24 hour  Intake 360 ml  Output 1500 ml  Net -1140 ml    Filed Weights   10/15/21 0108 10/16/21 0500 10/17/21 0531  Weight: 110 kg 106.8 kg 106.2 kg    Examination:  General: A/O x4, positive acute respiratory distress Eyes: negative scleral hemorrhage, negative anisocoria, negative icterus ENT: Negative Runny nose, negative gingival bleeding, Neck:  Negative scars, masses, torticollis, lymphadenopathy, JVD Lungs: decreased breath sounds bilaterally, positive inspiratory/expiratory wheezes Cardiovascular: Regular rate and rhythm without murmur gallop or rub normal S1 and S2 Abdomen: negative abdominal pain, nondistended, positive soft, bowel sounds, no rebound, no ascites, no appreciable mass Extremities: No significant cyanosis, clubbing, or edema bilateral lower extremities Skin: Negative rashes, lesions, ulcers Psychiatric:  Negative depression, negative anxiety, negative fatigue, negative mania  Central nervous system:  Cranial nerves II through XII intact, tongue/uvula midline, all extremities muscle strength 5/5, sensation intact throughout, negative dysarthria, negative expressive aphasia, negative receptive aphasia.  .     Data Reviewed: Care during the described time interval was provided by me .  I have reviewed this patient's available data, including medical history, events of note, physical examination, and all test results as part of my evaluation.  CBC: Recent Labs  Lab 10/14/21 2219 10/15/21 0431 10/16/21 0643 10/17/21 0431  WBC 7.0 5.3 5.9 5.8  NEUTROABS  --  3.7 3.9 4.3  HGB 14.4  13.8 14.3 13.0  HCT 44.9 40.8 43.4 38.5*  MCV 94.1 94.4 96.2 94.8  PLT 150 131* 133* 121*    Basic Metabolic Panel: Recent Labs  Lab 10/14/21 2219 10/15/21 0431 10/16/21 0643 10/17/21 0431  NA 128* 134* 136 133*  K 3.9 3.6 4.0 3.8  CL 95* 97* 94* 94*  CO2 22 25 29  32  GLUCOSE 151* 94 76 157*  BUN 22 22 26* 37*  CREATININE 1.72* 1.70* 1.71* 1.80*  CALCIUM 8.8* 8.7* 9.1 8.4*  MG 1.8 1.8 1.8 1.8  PHOS  --   --  4.1 4.4    GFR: Estimated Creatinine Clearance: 40.1 mL/min (A) (by C-G formula based on SCr of 1.8 mg/dL (H)). Liver Function Tests: Recent Labs  Lab 10/15/21 0431 10/16/21 0643 10/17/21 0431  AST 34 44* 38  ALT 26 28 26  ALKPHOS 62 67 56  BILITOT 0.8 1.0 0.9  PROT 5.9* 6.8 5.9*  ALBUMIN 3.5 3.6 3.2*    No results for input(s): LIPASE, AMYLASE in the last 168 hours. No results for input(s): AMMONIA in the last 168 hours. Coagulation Profile: No results for input(s): INR, PROTIME in the last 168 hours. Cardiac Enzymes: No results for input(s): CKTOTAL, CKMB, CKMBINDEX, TROPONINI in the last 168 hours. BNP (last 3 results) No results for input(s): PROBNP in the last 8760 hours. HbA1C: Recent Labs    10/15/21 0431  HGBA1C 7.5*    CBG: Recent Labs  Lab 10/16/21 1144 10/16/21 1609 10/16/21 2050 10/17/21 0305 10/17/21 0716  GLUCAP 106* 181* 227* 156* 128*    Lipid Profile: Recent Labs    10/16/21 0643 10/17/21 0431  CHOL 133 120  HDL 32* 28*  LDLCALC 84 78  TRIG 83 69  CHOLHDL 4.2 4.3    Thyroid Function Tests: Recent Labs    10/15/21 0431  TSH 1.817    Anemia Panel: No results for input(s): VITAMINB12, FOLATE, FERRITIN, TIBC, IRON, RETICCTPCT in the last 72 hours. Sepsis Labs: No results for input(s): PROCALCITON, LATICACIDVEN in the last 168 hours.  Recent Results (from the past 240 hour(s))  Resp Panel by RT-PCR (Flu A&B, Covid) Nasopharyngeal Swab     Status: None   Collection Time: 10/14/21 10:50 PM   Specimen:  Nasopharyngeal Swab; Nasopharyngeal(NP) swabs in vial transport medium  Result Value Ref Range Status   SARS Coronavirus 2 by RT PCR NEGATIVE NEGATIVE Final    Comment: (NOTE) SARS-CoV-2 target nucleic acids are NOT DETECTED.  The SARS-CoV-2 RNA is generally detectable in upper respiratory specimens during the acute phase of infection. The lowest concentration of SARS-CoV-2 viral copies this assay can detect is 138 copies/mL. A negative result does not preclude SARS-Cov-2 infection and should not be used as the sole basis for treatment or other patient management decisions. A negative result may occur with  improper specimen collection/handling, submission of specimen other than nasopharyngeal swab, presence of viral mutation(s) within the areas targeted by this assay, and inadequate number of viral copies(<138 copies/mL). A negative result must be combined with clinical observations, patient history, and epidemiological information. The expected result is Negative.  Fact Sheet for Patients:  EntrepreneurPulse.com.au  Fact Sheet for Healthcare Providers:  IncredibleEmployment.be  This test is no t yet approved or cleared by the [place] FDA and  has been authorized for detection and/or diagnosis of SARS-CoV-2 by FDA under an Emergency Use Authorization (EUA). This EUA will remain  in effect (meaning this test can be used) for the duration of the COVID-19 declaration under Section 564(b)(1) of the Act, 21 U.S.C.section 360bbb-3(b)(1), unless the authorization is terminated  or revoked sooner.       Influenza A by PCR NEGATIVE NEGATIVE Final   Influenza B by PCR NEGATIVE NEGATIVE Final    Comment: (NOTE) The Xpert Xpress SARS-CoV-2/FLU/RSV plus assay is intended as an aid in the diagnosis of influenza from Nasopharyngeal swab specimens and should not be used as a sole basis for treatment. Nasal washings and aspirates are unacceptable for  Xpert Xpress SARS-CoV-2/FLU/RSV testing.  Fact Sheet for Patients: EntrepreneurPulse.com.au  Fact Sheet for Healthcare Providers: IncredibleEmployment.be  This test is not yet approved or cleared by the [place] FDA and has been authorized for detection and/or diagnosis of SARS-CoV-2 by FDA under an Emergency Use Authorization (EUA). This EUA will remain in effect (meaning this test can be used) for  the duration of the COVID-19 declaration under Section 564(b)(1) of the Act, 21 U.S.C. section 360bbb-3(b)(1), unless the authorization is terminated or revoked.  Performed at [facility], [address] [place], [place] [address]   Respiratory (~20 pathogens) panel by PCR     Status: Abnormal   Collection Time: 10/15/21  3:23 PM   Specimen: Nasopharyngeal Swab; Respiratory  Result Value Ref Range Status   Adenovirus NOT DETECTED NOT DETECTED Final   Coronavirus 229E NOT DETECTED NOT DETECTED Final    Comment: (NOTE) The Coronavirus on the Respiratory Panel, DOES NOT test for the novel  Coronavirus (2019 nCoV)    Coronavirus HKU1 NOT DETECTED NOT DETECTED Final   Coronavirus NL63 NOT DETECTED NOT DETECTED Final   Coronavirus OC43 NOT DETECTED NOT DETECTED Final   Metapneumovirus DETECTED (A) NOT DETECTED Final   Rhinovirus / Enterovirus NOT DETECTED NOT DETECTED Final   Influenza A NOT DETECTED NOT DETECTED Final   Influenza B NOT DETECTED NOT DETECTED Final   Parainfluenza Virus 1 NOT DETECTED NOT DETECTED Final   Parainfluenza Virus 2 NOT DETECTED NOT DETECTED Final   Parainfluenza Virus 3 NOT DETECTED NOT DETECTED Final   Parainfluenza Virus 4 NOT DETECTED NOT DETECTED Final   Respiratory Syncytial Virus NOT DETECTED NOT DETECTED Final   Bordetella pertussis NOT DETECTED NOT DETECTED Final   Bordetella Parapertussis NOT DETECTED NOT DETECTED Final   Chlamydophila pneumoniae NOT DETECTED NOT DETECTED Final   Mycoplasma pneumoniae NOT  DETECTED NOT DETECTED Final    Comment: Performed at [facility], [address], [place], [place] [address]          Radiology Studies: ECHOCARDIOGRAM COMPLETE  Result Date: 10/15/2021    ECHOCARDIOGRAM REPORT   Patient Name:   ALDON DEVENY Date of Exam: 10/15/2021 Medical Rec #:  [id number]      Height:       76.0 in Accession #:    [id number]     Weight:       242.5 lb Date of Birth:  1934-08-10       BSA:          2.404 m Patient Age:    60 years       BP:           127/71 mmHg Patient Gender: M              HR:           70 bpm. Exam Location:  [facility] Procedure: 2D Echo, Cardiac Doppler, Color Doppler and Intracardiac            Opacification Agent Indications:    CHF  History:        Patient has prior history of Echocardiogram examinations, most                 recent 09/21/2017. CHF, Previous Myocardial Infarction and CAD,                 Defibrillator and Prior CABG, Arrythmias:Atrial Fibrillation and                 PVC, Signs/Symptoms:Syncope; Risk Factors:Hypertension, Diabetes                 and Dyslipidemia. Mitral Valve repair.  Sonographer:    Wenda Low Referring Phys: [id number] ASIA B [place]  Sonographer Comments: Image acquisition challenging due to respiratory motion. IMPRESSIONS  1. Septal flattening consistent with RV volume / pressure overload. Diffuse  hypokinesis, inferior , basal inferoseptal akinesis . Left ventricular ejection fraction, by estimation, is 25%%. The left ventricle has severely decreased function. The left ventricular internal cavity size was severely dilated. There is mild left ventricular hypertrophy. Left ventricular diastolic parameters are indeterminate.  2. Right ventricular systolic function is moderately reduced. The right ventricular size is moderately enlarged. There is moderately elevated pulmonary artery systolic pressure.  3. Left atrial size was moderately dilated.  4. Right atrial size was mildly dilated.  5. Mild mitral valve  regurgitation.  6. The aortic valve is tricuspid. Aortic valve regurgitation is trivial. Aortic valve sclerosis is present, with no evidence of aortic valve stenosis. FINDINGS  Left Ventricle: Septal flattening consistent with RV volume / pressure overload. Diffuse hypokinesis, inferior , basal inferoseptal akinesis. Left ventricular ejection fraction, by estimation, is 25%%. The left ventricle has severely decreased function.  Definity contrast agent was given IV to delineate the left ventricular endocardial borders. The left ventricular internal cavity size was severely dilated. There is mild left ventricular hypertrophy. Left ventricular diastolic parameters are indeterminate. Right Ventricle: The right ventricular size is moderately enlarged. Right vetricular wall thickness was not assessed. Right ventricular systolic function is moderately reduced. There is moderately elevated pulmonary artery systolic pressure. The tricuspid regurgitant velocity is 3.15 m/s, and with an assumed right atrial pressure of 15 mmHg, the estimated right ventricular systolic pressure is 54.7 mmHg. Left Atrium: Left atrial size was moderately dilated. Right Atrium: Right atrial size was mildly dilated. Pericardium: There is no evidence of pericardial effusion. Mitral Valve: There is mild thickening of the mitral valve leaflet(s). Mild mitral annular calcification. Mild mitral valve regurgitation. MV peak gradient, 9.7 mmHg. The mean mitral valve gradient is 2.0 mmHg. Tricuspid Valve: The tricuspid valve is normal in structure. Tricuspid valve regurgitation is mild. Aortic Valve: The aortic valve is tricuspid. Aortic valve regurgitation is trivial. Aortic valve sclerosis is present, with no evidence of aortic valve stenosis. Aortic valve mean gradient measures 2.0 mmHg. Aortic valve peak gradient measures 4.8 mmHg. Aortic valve area, by VTI measures 2.72 cm. Pulmonic Valve: The pulmonic valve was normal in structure. Pulmonic valve  regurgitation is not visualized. Aorta: The aortic root is normal in size and structure. IAS/Shunts: No atrial level shunt detected by color flow Doppler. Additional Comments: A device lead is visualized.  LEFT VENTRICLE PLAX 2D LVIDd:         6.30 cm   Diastology LVIDs:         5.60 cm   LV e' medial:   8.05 cm/s LV PW:         1.40 cm   LV E/e' medial: 16.6 LV IVS:        1.40 cm LVOT diam:     2.10 cm LV SV:         57 LV SV Index:   24 LVOT Area:     3.46 cm  RIGHT VENTRICLE RV Basal diam:  4.75 cm RV Mid diam:    3.10 cm TAPSE (M-mode): 1.6 cm LEFT ATRIUM              Index        RIGHT ATRIUM           Index LA diam:        5.40 cm  2.25 cm/m   RA Area:     22.60 cm LA Vol (A2C):   115.0 ml 47.83 ml/m  RA Volume:   69.80 ml  29.03  ml/m LA Vol (A4C):   110.0 ml 45.75 ml/m LA Biplane Vol: 115.0 ml 47.83 ml/m  AORTIC VALVE                    PULMONIC VALVE AV Area (Vmax):    2.68 cm     PV Vmax:       0.66 m/s AV Area (Vmean):   2.40 cm     PV Peak grad:  1.8 mmHg AV Area (VTI):     2.72 cm AV Vmax:           109.00 cm/s AV Vmean:          66.400 cm/s AV VTI:            0.209 m AV Peak Grad:      4.8 mmHg AV Mean Grad:      2.0 mmHg LVOT Vmax:         84.20 cm/s LVOT Vmean:        46.100 cm/s LVOT VTI:          0.164 m LVOT/AV VTI ratio: 0.78  AORTA Ao Root diam: 3.10 cm MITRAL VALVE                TRICUSPID VALVE MV Area (PHT): 3.31 cm     TR Peak grad:   39.7 mmHg MV Area VTI:   1.76 cm     TR Vmax:        315.00 cm/s MV Peak grad:  9.7 mmHg MV Mean grad:  2.0 mmHg     SHUNTS MV Vmax:       1.56 m/s     Systemic VTI:  0.16 m MV Vmean:      60.5 cm/s    Systemic Diam: 2.10 cm MV Decel Time: 229 msec MV E velocity: 134.00 cm/s Dorris Carnes MD Electronically signed by Dorris Carnes MD Signature Date/Time: 10/15/2021/4:49:27 PM    Final         Scheduled Meds:  apixaban  2.5 mg Oral BID   carvedilol  12.5 mg Oral BID WC   dextromethorphan-guaiFENesin  1 tablet Oral BID   furosemide  60 mg  Intravenous BID   gabapentin  100 mg Oral TID   insulin aspart  0-9 Units Subcutaneous Q4H   insulin detemir  10 Units Subcutaneous QHS   ipratropium-albuterol  3 mL Nebulization TID   losartan  25 mg Oral Daily   melatonin  6 mg Oral QHS   methylPREDNISolone (SOLU-MEDROL) injection  40 mg Intravenous Daily   pantoprazole  40 mg Oral Daily   rosuvastatin  5 mg Oral QPM   Continuous Infusions:   LOS: 3 days    Time spent:40 min    Danna Casella, Geraldo Docker, MD [facility]   If 7PM-7AM, please contact night-coverage 10/17/2021, 7:58 AM

## 2021-10-17 NOTE — Plan of Care (Signed)

## 2021-10-17 NOTE — Evaluation (Signed)
Physical Therapy Evaluation Patient Details Name: Wayne Gordon MRN: [id number] DOB: 14-Jan-1934 Today's Date: 10/17/2021  History of Present Illness  88 y.o. WM PMHx Atrial fibrillation, CAD, Ischemic cardiomyopathy (EF 25%) ICD placement [place], Dr. Lovena Le 03/28/12, HTN, HLD, CKD stage IIIb (baseline Cr1.7-2.0), DM type II hold with hyperglycemia, GERD,      Presents to ED with chief complaint of dyspnea.  Patient reports he has dyspnea that started 2 days ago and has been getting worse since it started.  He has associated cough productive of clear sputum, and wheeze.  Patient reports orthopnea and bilateral lower extremity edema as well.  Patient had a 2 pound weight gain on Wednesday, and a 3 pound weight gain on Thursday.  He takes 40 mg of Lasix daily, but increased his Lasix to 100 mg on the day of presentation.  He did not have an increase in urine output.  He reports in fact he has noticed a decrease in urine output from his baseline.  Patient reports no history of COPD, so he has no inhaler use.  That he could attempt to use for his wheezing. Patient denies any fevers, chest pain, palpitations.  Patient denies any rashes, myalgias, diarrhea, constipation, dysuria, hematuria.  Patient has no other complaints at this time.   Clinical Impression  Patient agreeable to participating in PT evaluation today. Patient sitting in recliner at beginning and end of session but reports ability to get in and out of bed without assistance. Patient able to complete transfers and ambulation with supervision for safety. Patient initially attempted ambulation without an assistive device, however, was reaching for objects without assistive device. Patient exhibited somewhat slow, steady cadence with RW and cues to walk within base of support for 300 feet with complaints of fatigue at end of ambulation and O2 sat at 93%.      Recommendations for follow up therapy are one component of a multi-disciplinary discharge  planning process, led by the attending physician.  Recommendations may be updated based on patient status, additional functional criteria and insurance authorization.  Follow Up Recommendations Outpatient PT (Patient reports he will begin going to [employer] and use their strengthening equipment instead.)    Assistance Recommended at Discharge PRN  Patient can return home with the following  A little help with walking and/or transfers;A little help with bathing/dressing/bathroom;Help with stairs or ramp for entrance    Equipment Recommendations None recommended by PT  Recommendations for Other Services       Functional Status Assessment Patient has had a recent decline in their functional status and demonstrates the ability to make significant improvements in function in a reasonable and predictable amount of time.     Precautions / Restrictions Precautions Precautions: Fall Precaution Comments: patient reports no falls in the last six months Restrictions Weight Bearing Restrictions: No      Mobility  Bed Mobility Overal bed mobility: Modified Independent      Transfers Overall transfer level: Needs assistance Equipment used: Rolling walker (2 wheels) Transfers: Sit to/from Stand, Bed to chair/wheelchair/BSC Sit to Stand: Supervision Stand pivot transfers: Supervision     Anterior-Posterior transfers: Modified independent (Device/Increase time)   General transfer comment: somewhat slow and labored sit to stand    Ambulation/Gait Ambulation/Gait assistance: Supervision Gait Distance (Feet): 300 Feet Assistive device: Rolling walker (2 wheels), None Gait Pattern/deviations: Step-through pattern, Decreased step length - right, Decreased step length - left, Decreased stride length, Trunk flexed, Wide base of support Gait velocity: decreased  General Gait Details: patient reaching for objects without assistive device; somewhat slow steady cadence with RW and cues  to walk within base of support; on room air; limited by fatigue  Stairs      Wheelchair Mobility    Modified Rankin (Stroke Patients Only)       Balance Overall balance assessment: Mild deficits observed, not formally tested         Pertinent Vitals/Pain Pain Assessment Pain Assessment: No/denies pain    Home Living Family/patient expects to be discharged to:: Private residence Living Arrangements: Spouse/significant other;Children Available Help at Discharge: Family;Available 24 hours/day (son lives with patient also) Type of Home: House Home Access: Ramped entrance       Home Layout: One level Home Equipment: Grab bars - tub/shower;Shower seat;Cane - single point      Prior Function Prior Level of Function : Driving;Independent/Modified Independent     Mobility Comments: only used SPC when felt weak; ambulation limited due to LBP and sciatic pain in legs ADLs Comments: Independent     Hand Dominance        Extremity/Trunk Assessment   Upper Extremity Assessment Upper Extremity Assessment: Generalized weakness    Lower Extremity Assessment Lower Extremity Assessment: Generalized weakness;RLE deficits/detail;LLE deficits/detail RLE Sensation: history of peripheral neuropathy LLE Sensation: history of peripheral neuropathy    Cervical / Trunk Assessment Cervical / Trunk Assessment: Kyphotic  Communication   Communication: No difficulties  Cognition Arousal/Alertness: Awake/alert Behavior During Therapy: WFL for tasks assessed/performed Overall Cognitive Status: Within Functional Limits for tasks assessed        General Comments      Exercises     Assessment/Plan    PT Assessment Patient needs continued PT services  PT Problem List Decreased strength;Decreased mobility;Decreased activity tolerance;Decreased balance;Decreased knowledge of use of DME       PT Treatment Interventions DME instruction;Therapeutic exercise;Gait  training;Balance training;Stair training;Neuromuscular re-education;Functional mobility training;Therapeutic activities;Patient/family education    PT Goals (Current goals can be found in the Care Plan section)  Acute Rehab PT Goals Patient Stated Goal: Go home and [facility]. Time For Goal Achievement: 10/24/21 Potential to Achieve Goals: Good    Frequency Min 3X/week        AM-PAC PT "6 Clicks" Mobility  Outcome Measure Help needed turning from your back to your side while in a flat bed without using bedrails?: None Help needed moving from lying on your back to sitting on the side of a flat bed without using bedrails?: None Help needed moving to and from a bed to a chair (including a wheelchair)?: A Little Help needed standing up from a chair using your arms (e.g., wheelchair or bedside chair)?: A Little Help needed to walk in hospital room?: A Little Help needed climbing 3-5 steps with a railing? : A Little 6 Click Score: 20    End of Session   Activity Tolerance: Patient tolerated treatment well;Patient limited by fatigue Patient left: in chair;with chair alarm set;with call bell/phone within reach Nurse Communication: Mobility status PT Visit Diagnosis: Unsteadiness on feet (R26.81);Other abnormalities of gait and mobility (R26.89);Muscle weakness (generalized) (M62.81);Other symptoms and signs involving the nervous system (R29.898)    Time: 1335-1400 PT Time Calculation (min) (ACUTE ONLY): 25 min   Charges:   PT Evaluation $PT Eval Low Complexity: 1 Low PT Treatments $Therapeutic Activity: 8-22 mins        Floria Raveling. Hartnett-Rands, MS, PT [facility] [place] [phone]  Pamala Hurry  Hartnett-Rands 10/17/2021, 1:08 PM

## 2021-10-17 NOTE — Plan of Care (Signed)
°  Problem: Acute Rehab PT Goals(only PT should resolve) Goal: Patient Will Transfer Sit To/From Stand Outcome: Progressing Flowsheets (Taken 10/17/2021 1312) Patient will transfer sit to/from stand: with modified independence Goal: Pt Will Transfer Bed To Chair/Chair To Bed Outcome: Progressing Flowsheets (Taken 10/17/2021 1312) Pt will Transfer Bed to Chair/Chair to Bed: with modified independence Goal: Pt Will Ambulate Outcome: Progressing Flowsheets (Taken 10/17/2021 1312) Pt will Ambulate:  > 125 feet  with least restrictive assistive device  with supervision Goal: Pt Will Go Up/Down Stairs Outcome: Progressing Goal: Pt/caregiver will Perform Home Exercise Program Outcome: Progressing Flowsheets (Taken 10/17/2021 1312) Pt/caregiver will Perform Home Exercise Program:  For increased strengthening  For improved balance  With Supervision, verbal cues required/provided   Katina Dung. Hartnett-Rands, MS, PT [facility] [place] [facility] [phone] 10/17/2021

## 2021-10-18 LAB — CBC WITH DIFFERENTIAL/PLATELET
Abs Immature Granulocytes: 0.03 10*3/uL (ref 0.00–0.07)
Basophils Absolute: 0 10*3/uL (ref 0.0–0.1)
Basophils Relative: 0 %
Eosinophils Absolute: 0 10*3/uL (ref 0.0–0.5)
Eosinophils Relative: 0 %
HCT: 39.2 % (ref 39.0–52.0)
Hemoglobin: 12.9 g/dL — ABNORMAL LOW (ref 13.0–17.0)
Immature Granulocytes: 0 %
Lymphocytes Relative: 16 %
Lymphs Abs: 1.2 10*3/uL (ref 0.7–4.0)
MCH: 30.3 pg (ref 26.0–34.0)
MCHC: 32.9 g/dL (ref 30.0–36.0)
MCV: 92 fL (ref 80.0–100.0)
Monocytes Absolute: 0.5 10*3/uL (ref 0.1–1.0)
Monocytes Relative: 6 %
Neutro Abs: 5.6 10*3/uL (ref 1.7–7.7)
Neutrophils Relative %: 78 %
Platelets: 143 10*3/uL — ABNORMAL LOW (ref 150–400)
RBC: 4.26 MIL/uL (ref 4.22–5.81)
RDW: 13.7 % (ref 11.5–15.5)
WBC: 7.3 10*3/uL (ref 4.0–10.5)
nRBC: 0 % (ref 0.0–0.2)

## 2021-10-18 LAB — GLUCOSE, CAPILLARY
Glucose-Capillary: 126 mg/dL — ABNORMAL HIGH (ref 70–99)
Glucose-Capillary: 169 mg/dL — ABNORMAL HIGH (ref 70–99)
Glucose-Capillary: 186 mg/dL — ABNORMAL HIGH (ref 70–99)
Glucose-Capillary: 214 mg/dL — ABNORMAL HIGH (ref 70–99)

## 2021-10-18 LAB — COMPREHENSIVE METABOLIC PANEL
ALT: 30 U/L (ref 0–44)
AST: 40 U/L (ref 15–41)
Albumin: 3.2 g/dL — ABNORMAL LOW (ref 3.5–5.0)
Alkaline Phosphatase: 53 U/L (ref 38–126)
Anion gap: 9 (ref 5–15)
BUN: 46 mg/dL — ABNORMAL HIGH (ref 8–23)
CO2: 31 mmol/L (ref 22–32)
Calcium: 8.7 mg/dL — ABNORMAL LOW (ref 8.9–10.3)
Chloride: 94 mmol/L — ABNORMAL LOW (ref 98–111)
Creatinine, Ser: 1.93 mg/dL — ABNORMAL HIGH (ref 0.61–1.24)
GFR, Estimated: 34 mL/min — ABNORMAL LOW (ref >60)
Glucose, Bld: 156 mg/dL — ABNORMAL HIGH (ref 70–99)
Potassium: 3.7 mmol/L (ref 3.5–5.1)
Sodium: 134 mmol/L — ABNORMAL LOW (ref 135–145)
Total Bilirubin: 0.8 mg/dL (ref 0.3–1.2)
Total Protein: 5.8 g/dL — ABNORMAL LOW (ref 6.5–8.1)

## 2021-10-18 LAB — MAGNESIUM: Magnesium: 1.9 mg/dL (ref 1.7–2.4)

## 2021-10-18 LAB — PHOSPHORUS: Phosphorus: 3.5 mg/dL (ref 2.5–4.6)

## 2021-10-18 MED ORDER — OXYCODONE HCL 5 MG PO TABS: 5.0000 mg | ORAL_TABLET | ORAL | 0 refills | Status: DC | PRN

## 2021-10-18 MED ORDER — DAPAGLIFLOZIN PROPANEDIOL 10 MG PO TABS
10.0000 mg | ORAL_TABLET | Freq: Every day | ORAL | Status: DC
Start: 2021-10-18 — End: 2021-10-18
  Administered 2021-10-18: 10 mg via ORAL
  Filled 2021-10-18 (×3): qty 1

## 2021-10-18 MED ORDER — FUROSEMIDE 20 MG PO TABS: 60.0000 mg | ORAL_TABLET | Freq: Every day | ORAL | Status: DC

## 2021-10-18 MED ORDER — IPRATROPIUM-ALBUTEROL 0.5-2.5 (3) MG/3ML IN SOLN: 3.0000 mL | Freq: Two times a day (BID) | RESPIRATORY_TRACT | Status: DC

## 2021-10-18 MED ORDER — CARVEDILOL 12.5 MG PO TABS: 12.5000 mg | ORAL_TABLET | Freq: Two times a day (BID) | ORAL | 0 refills | Status: DC

## 2021-10-18 MED ORDER — ROSUVASTATIN CALCIUM 10 MG PO TABS: 10.0000 mg | ORAL_TABLET | Freq: Every evening | ORAL | 0 refills | Status: DC

## 2021-10-18 MED ORDER — DAPAGLIFLOZIN PROPANEDIOL 10 MG PO TABS: 10.0000 mg | ORAL_TABLET | Freq: Every day | ORAL | 0 refills | Status: DC

## 2021-10-18 MED ORDER — ALBUTEROL SULFATE HFA 108 (90 BASE) MCG/ACT IN AERS: 2.0000 | INHALATION_SPRAY | Freq: Four times a day (QID) | RESPIRATORY_TRACT | 2 refills | Status: DC | PRN

## 2021-10-18 MED ORDER — ACETAMINOPHEN 325 MG PO TABS: 650.0000 mg | ORAL_TABLET | Freq: Four times a day (QID) | ORAL | 0 refills | Status: DC | PRN

## 2021-10-18 MED ORDER — DM-GUAIFENESIN ER 30-600 MG PO TB12
1.0000 | ORAL_TABLET | Freq: Two times a day (BID) | ORAL | 0 refills | Status: DC
Start: 2021-10-18 — End: 2024-03-29

## 2021-10-18 MED ORDER — SALINE SPRAY 0.65 % NA SOLN: 1.0000 | NASAL | 0 refills | Status: DC | PRN

## 2021-10-18 MED ORDER — ONDANSETRON HCL 4 MG PO TABS: 4.0000 mg | ORAL_TABLET | Freq: Four times a day (QID) | ORAL | 0 refills | Status: DC | PRN

## 2021-10-18 NOTE — TOC Transition Note (Signed)
Transition of Care [facility]) - CM/SW Discharge Note   Patient Details  Name: Wayne Gordon MRN: [id number] Date of Birth: May 02, 1934  [facility] CM/SW Contact:  Elliot Gault, LCSW Phone Number: 10/18/2021, 1:27 PM   Clinical Narrative:     Pt with orders for dc today. Pt has stated [facility]. Anticipating family transport home. No TOC needs identified.  Final next level of care: Home/Self Care Barriers to Discharge: Barriers Resolved   Patient Goals and CMS Choice Patient states their goals for this hospitalization and ongoing recovery are:: Return home CMS Medicare.gov Compare Post Acute Care list provided to:: Patient Represenative (must comment) Choice offered to / list presented to : Spouse  Discharge Placement                       Discharge Plan and Services In-house Referral: Clinical Social Work Discharge Planning Services: CM Consult                                 Social Determinants of Health (SDOH) Interventions     Readmission Risk Interventions Readmission Risk Prevention Plan 10/15/2021  Medication Screening Complete  Transportation Screening Complete  Some recent data might be hidden

## 2021-10-18 NOTE — Progress Notes (Signed)
Inpatient Diabetes Program Recommendations  AACE/ADA: New Consensus Statement on Inpatient Glycemic Control   Target Ranges:  Prepandial:   less than 140 mg/dL      Peak postprandial:   less than 180 mg/dL (1-2 hours)      Critically ill patients:  140 - 180 mg/dL    Latest Reference Range & Units 10/18/21 00:58 10/18/21 04:00 10/18/21 07:35  Glucose-Capillary 70 - 99 mg/dL 852 (H) 778 (H) 242 (H)    Latest Reference Range & Units 10/17/21 07:16 10/17/21 11:14 10/17/21 16:51 10/17/21 20:21  Glucose-Capillary 70 - 99 mg/dL 353 (H) 614 (H) 431 (H) 266 (H)   Review of Glycemic Control  Diabetes history: DM2 Outpatient Diabetes medications: Glipizide 10 mg BID, Toujeo 18 units daily Current orders for Inpatient glycemic control: Levemir 18 units QHS, Farxiga 5 mg daily, Novolog 0-9 units Q4H  Inpatient Diabetes Program Recommendations:    Insulin: If patient is eating well, please consider changing CBGs to AC&HS and Novolog to 0-9 units TID with meals and Novolog 0-5 units QHS.  Please consider ordering Novolog 3 units TID with meals for meal coverage if patient eats at least 50% of meals.  Thanks, Orlando Penner, RN, MSN, CDE Diabetes Coordinator Inpatient Diabetes Program [phone] (Team Pager from 8am to 5pm)

## 2021-10-18 NOTE — Discharge Summary (Signed)
Physician Discharge Summary  Wayne Gordon [id number] DOB: 05/07/1934 DOA: 10/14/2021  PCP: Adaline Sill, NP  Admit date: 10/14/2021 Discharge date: 10/18/2021  Time spent: 15 minutes  Recommendations for Outpatient Follow-up:   Covid vaccination; 2/3 vaccinated    Acute respiratory failure with hypoxia/positive metapneumovirus pneumonia (present on admission) -O2 sats down to 87% -No O2 requirement at baseline -Continuous pulse ox monitoring.  Titrate O2 to maintain SPO2 > 92%  -DuoNeb TID - Flutter valve - Incentive spirometry -Completed course of Solu-Medrol -Mucinex DM BID -May be secondary to CHF exacerbation, or given his exposure to ill patients viral infection. -Low suspicion for bacterial pneumonia as there is no leukocytosis, no fever, no pneumonia finding on chest x-ray -Patient has no chest pain, no changes on EKG-troponin not indicated at this time -Patient is not tachycardic, blood pressure stable, peripheral edema is symmetrical, very low suspicion for clot and D-dimer if patient becomes tachycardic or hypotensive, or suspicion increases for PE -Repeat chest x-ray in the a.m. -2/17: Positive metapneumovirus see results below  SATURATION QUALIFICATIONS: (This note is used to comply with regulatory documentation for home oxygen) Patient Saturations on Room Air at Rest = 98% Patient Saturations on Room Air while Ambulating = 96% Patient Saturations on 0 Liters of oxygen while Ambulating = 96% Please briefly explain why patient needs home oxygen:  -Does not meet criteria for home O2 -Resolved   Acute on chronic systolic CHF - Longstanding cardiomyopathy with EF 20-25% in 2013 and similar results by repeat echo in 08/2017 but also noted to have moderate pulmonary HTN at that time. Admitted with worsening dyspnea on exertion and an associated 5 lb weight gain on his home scales.  - BNP elevated to 511 on admission. -Anticipate he will require IV diuresis for 1-2  additional days as his baseline weight is around 235 lbs(106.5 kg)  -2/19 at baseline weight and at 242 lbs today.  -Lasix p.o. 40 mg daily  -Coreg 12.5 mg BID - 12/19 per patient was taken off losartan by his cardiologist and given his low blood pressure we will discontinue -2/19 DC hydralazine 25 mg TID -2/19 per cardiology recommendations start Farxiga 5 mg daily we will have to monitor closely as his GFR is borderline -2/17 Echocardiogram shows that patient's cardiac function overall there is no significant decrease see results below -Strict in and out -2.2 L, 2/20 - Daily weight Filed Weights   10/16/21 0500 10/17/21 0531 10/18/21 0500  Weight: 106.8 kg 106.2 kg 106 kg  -106 kg (234 pounds) will be patient's new base weight. -Per patient has follow-up this week with his cardiologist  CAD - He is s/p CABG in 2013. He reports dyspnea on exertion in the setting of fluid overload but no symptoms prior to this and no recent chest pain.  -See CHF   Mitral valve repair - He underwent MV repair at that time of CABG in 2013. Echo in 2019 showed mild MR and mild to moderate TR.   VT and sinus node dysfunction - He is s/p ICD placement in 2013 with upgrade to Biotronik Bi-V ICD in 09/2020. Followed by Dr. Lovena Le as an outpatient.    Permanent Atrial fibrillation -2/18 currently NSR - Eliquis 2.5 mg BID -See CHF   Essential HTN (hypertension)- (present on admission) -See CHF   CKD stage 3B (baseline Cr 1.9 - 2.1 in 2022).  -At 1.72 on admission and stable at 1.70 today. Continue to follow with diuresis.  Lab Results  Component Value Date   CREATININE 1.93 (H) 10/18/2021   CREATININE 1.80 (H) 10/17/2021   CREATININE 1.71 (H) 10/16/2021   CREATININE 1.70 (H) 10/15/2021   CREATININE 1.72 (H) 10/14/2021  -At baseline  DM type II uncontrolled with complication on long-term insulin  - 2/19 DC sensitive SSI  - Lantus 18 units, home dose -Glipizide 10 mg BID , home dose    HLD(present on admission) - 2/18 LDL=78: Goal is LDL<70 -2/19 increased Crestor 10 mg    Discharge Diagnoses:  Principal Problem:   Acute respiratory failure with hypoxia (HCC) Active Problems:   HTN (hypertension)   Hyperlipidemia   Atrial fibrillation (HCC)   Type 2 diabetes mellitus without complication ([place])   CHF exacerbation ([place])   CAD (coronary artery disease) of artery bypass graft   History of implantable cardioverter-defibrillator (ICD) placement   Benign essential HTN   Chronic kidney disease, stage 3b (HCC)   Human metapneumovirus pneumonia   Acute on chronic systolic CHF (congestive heart failure) ([place])   Discharge Condition: Stable  Diet recommendation: Heart healthy/carb modified  Filed Weights   10/16/21 0500 10/17/21 0531 10/18/21 0500  Weight: 106.8 kg 106.2 kg 106 kg    History of present illness:  88 y.o. WM PMHx Atrial fibrillation, CAD, Ischemic cardiomyopathy (EF 25%) ICD placement Biotronik, Dr. Lovena Le 03/28/12, HTN, HLD, CKD stage IIIb (baseline Cr1.7-2.0), DM type II hold with hyperglycemia, GERD,    Presents to ED with chief complaint of dyspnea.  Patient reports he has dyspnea that started 2 days ago and has been getting worse since it started.  He has associated cough productive of clear sputum, and wheeze.  Patient reports orthopnea and bilateral lower extremity edema as well.  Patient had a 2 pound weight gain on Wednesday, and a 3 pound weight gain on Thursday.  He takes 40 mg of Lasix daily, but increased his Lasix to 100 mg on the day of presentation.  He did not have an increase in urine output.  He reports in fact he has noticed a decrease in urine output from his baseline.  Patient reports no history of COPD, so he has no inhaler use.  That he could attempt to use for his wheezing. Patient denies any fevers, chest pain, palpitations.  Patient denies any rashes, myalgias, diarrhea, constipation, dysuria, hematuria.  Patient has no other  complaints at this time.  Hospital Course:  See above  Procedures: 2/17 Echocardiogram; left Ventricle: Septal flattening consistent with RV volume / pressure  overload. Diffuse hypokinesis, inferior , basal inferoseptal akinesis.  -LVEF=25%%. The left ventricle has severely decreased function.  -The left ventricular internal cavity size was severely dilated. There is mild left ventricular hypertrophy.  -Left ventricular diastolic parameters are indeterminate.  Right Ventricle: The right ventricular size is moderately enlarged. Right ventricular systolic  function is moderately reduced.  -Moderately elevated PASP estimated right ventricular systolic pressure is [id number] mmHg.  Left Atrium: Left atrial size was moderately dilated.      Consultations: Cardiology Dr. Harrington Challenger     Cultures  2/16 influenza A/B negative 2/16 SARS coronavirus negative 2/17 respiratory virus panel positive metapneumovirus   Antibiotics Anti-infectives (From admission, onward)    None         Discharge Exam: Vitals:   10/17/21 2058 10/18/21 0500 10/18/21 0630 10/18/21 0725  BP:   118/69   Pulse:   72   Resp:   16   Temp:   98.3 F (36.8 C)   TempSrc:  Oral   SpO2: 95%  93% 95%  Weight:  106 kg    Height:        General: A/O x4, positive acute respiratory distress Eyes: negative scleral hemorrhage, negative anisocoria, negative icterus ENT: Negative Runny nose, negative gingival bleeding, Neck:  Negative scars, masses, torticollis, lymphadenopathy, JVD Lungs: decreased breath sounds bilaterally, positive inspiratory/expiratory wheezes  Cardiovascular: Regular rate and rhythm without murmur gallop or rub normal S1 and S2   Discharge Instructions         The results of significant diagnostics from this hospitalization (including imaging, microbiology, ancillary and laboratory) are listed below for reference.    Significant Diagnostic Studies: DG CHEST PORT 1 VIEW  Result Date:  10/15/2021 CLINICAL DATA:  Acute respiratory failure with hypoxia. EXAM: PORTABLE CHEST 1 VIEW COMPARISON:  October 14, 2021. FINDINGS: Stable cardiomegaly. Status post coronary bypass graft. Left-sided defibrillator is unchanged in position. Stable elevated right hemidiaphragm is noted with minimal right basilar subsegmental atelectasis. Left lung base is not included in field-of-view. Bony thorax is unremarkable. IMPRESSION: Stable elevated right hemidiaphragm with minimal right basilar subsegmental atelectasis. Left lung base is not included in field-of-view. Electronically Signed   By: Marijo Conception M.D.   On: 10/15/2021 06:41   DG Chest Portable 1 View  Result Date: 10/14/2021 CLINICAL DATA:  Shortness of breath EXAM: PORTABLE CHEST 1 VIEW COMPARISON:  10/12/2020 FINDINGS: Eventration of the right hemidiaphragm. Lungs are essentially clear. No pleural effusion or pneumothorax. Cardiomegaly. Left subclavian ICD. Postsurgical changes related to prior CABG. Median sternotomy. IMPRESSION: No evidence of acute cardiopulmonary disease. Electronically Signed   By: Julian Hy M.D.   On: 10/14/2021 22:42   ECHOCARDIOGRAM COMPLETE  Result Date: 10/15/2021    ECHOCARDIOGRAM REPORT   Patient Name:   Wayne Gordon Date of Exam: 10/15/2021 Medical Rec #:  [id number]      Height:       76.0 in Accession #:    [id number]     Weight:       242.5 lb Date of Birth:  04-12-1934       BSA:          2.404 m Patient Age:    88 years       BP:           127/71 mmHg Patient Gender: M              HR:           70 bpm. Exam Location:  [facility] Procedure: 2D Echo, Cardiac Doppler, Color Doppler and Intracardiac            Opacification Agent Indications:    CHF  History:        Patient has prior history of Echocardiogram examinations, most                 recent 09/21/2017. CHF, Previous Myocardial Infarction and CAD,                 Defibrillator and Prior CABG, Arrythmias:Atrial Fibrillation and                 PVC,  Signs/Symptoms:Syncope; Risk Factors:Hypertension, Diabetes                 and Dyslipidemia. Mitral Valve repair.  Sonographer:    Wenda Low Referring Phys: [id number] ASIA B Braymer  Sonographer Comments: Image acquisition challenging due to respiratory motion. IMPRESSIONS  1. Septal flattening consistent with RV volume /  pressure overload. Diffuse hypokinesis, inferior , basal inferoseptal akinesis . Left ventricular ejection fraction, by estimation, is 25%%. The left ventricle has severely decreased function. The left ventricular internal cavity size was severely dilated. There is mild left ventricular hypertrophy. Left ventricular diastolic parameters are indeterminate.  2. Right ventricular systolic function is moderately reduced. The right ventricular size is moderately enlarged. There is moderately elevated pulmonary artery systolic pressure.  3. Left atrial size was moderately dilated.  4. Right atrial size was mildly dilated.  5. Mild mitral valve regurgitation.  6. The aortic valve is tricuspid. Aortic valve regurgitation is trivial. Aortic valve sclerosis is present, with no evidence of aortic valve stenosis. FINDINGS  Left Ventricle: Septal flattening consistent with RV volume / pressure overload. Diffuse hypokinesis, inferior , basal inferoseptal akinesis. Left ventricular ejection fraction, by estimation, is 25%%. The left ventricle has severely decreased function.  Definity contrast agent was given IV to delineate the left ventricular endocardial borders. The left ventricular internal cavity size was severely dilated. There is mild left ventricular hypertrophy. Left ventricular diastolic parameters are indeterminate. Right Ventricle: The right ventricular size is moderately enlarged. Right vetricular wall thickness was not assessed. Right ventricular systolic function is moderately reduced. There is moderately elevated pulmonary artery systolic pressure. The tricuspid regurgitant velocity is  3.15 m/s, and with an assumed right atrial pressure of 15 mmHg, the estimated right ventricular systolic pressure is [id number] mmHg. Left Atrium: Left atrial size was moderately dilated. Right Atrium: Right atrial size was mildly dilated. Pericardium: There is no evidence of pericardial effusion. Mitral Valve: There is mild thickening of the mitral valve leaflet(s). Mild mitral annular calcification. Mild mitral valve regurgitation. MV peak gradient, 9.7 mmHg. The mean mitral valve gradient is 2.0 mmHg. Tricuspid Valve: The tricuspid valve is normal in structure. Tricuspid valve regurgitation is mild. Aortic Valve: The aortic valve is tricuspid. Aortic valve regurgitation is trivial. Aortic valve sclerosis is present, with no evidence of aortic valve stenosis. Aortic valve mean gradient measures 2.0 mmHg. Aortic valve peak gradient measures 4.8 mmHg. Aortic valve area, by VTI measures 2.72 cm. Pulmonic Valve: The pulmonic valve was normal in structure. Pulmonic valve regurgitation is not visualized. Aorta: The aortic root is normal in size and structure. IAS/Shunts: No atrial level shunt detected by color flow Doppler. Additional Comments: A device lead is visualized.  LEFT VENTRICLE PLAX 2D LVIDd:         6.30 cm   Diastology LVIDs:         5.60 cm   LV e' medial:   8.05 cm/s LV PW:         1.40 cm   LV E/e' medial: 16.6 LV IVS:        1.40 cm LVOT diam:     2.10 cm LV SV:         57 LV SV Index:   24 LVOT Area:     3.46 cm  RIGHT VENTRICLE RV Basal diam:  4.75 cm RV Mid diam:    3.10 cm TAPSE (M-mode): 1.6 cm LEFT ATRIUM              Index        RIGHT ATRIUM           Index LA diam:        5.40 cm  2.25 cm/m   RA Area:     22.60 cm LA Vol (A2C):   115.0 ml 47.83 ml/m  RA Volume:   69.80 ml  29.03 ml/m LA Vol (A4C):   110.0 ml 45.75 ml/m LA Biplane Vol: 115.0 ml 47.83 ml/m  AORTIC VALVE                    PULMONIC VALVE AV Area (Vmax):    2.68 cm     PV Vmax:       0.66 m/s AV Area (Vmean):   2.40 cm     PV  Peak grad:  1.8 mmHg AV Area (VTI):     2.72 cm AV Vmax:           109.00 cm/s AV Vmean:          66.400 cm/s AV VTI:            0.209 m AV Peak Grad:      4.8 mmHg AV Mean Grad:      2.0 mmHg LVOT Vmax:         84.20 cm/s LVOT Vmean:        46.100 cm/s LVOT VTI:          0.164 m LVOT/AV VTI ratio: 0.78  AORTA Ao Root diam: 3.10 cm MITRAL VALVE                TRICUSPID VALVE MV Area (PHT): 3.31 cm     TR Peak grad:   39.7 mmHg MV Area VTI:   1.76 cm     TR Vmax:        315.00 cm/s MV Peak grad:  9.7 mmHg MV Mean grad:  2.0 mmHg     SHUNTS MV Vmax:       1.56 m/s     Systemic VTI:  0.16 m MV Vmean:      60.5 cm/s    Systemic Diam: 2.10 cm MV Decel Time: 229 msec MV E velocity: 134.00 cm/s Dorris Carnes MD Electronically signed by Dorris Carnes MD Signature Date/Time: 10/15/2021/4:49:27 PM    Final     Microbiology: Recent Results (from the past 240 hour(s))  Resp Panel by RT-PCR (Flu A&B, Covid) Nasopharyngeal Swab     Status: None   Collection Time: 10/14/21 10:50 PM   Specimen: Nasopharyngeal Swab; Nasopharyngeal(NP) swabs in vial transport medium  Result Value Ref Range Status   SARS Coronavirus 2 by RT PCR NEGATIVE NEGATIVE Final    Comment: (NOTE) SARS-CoV-2 target nucleic acids are NOT DETECTED.  The SARS-CoV-2 RNA is generally detectable in upper respiratory specimens during the acute phase of infection. The lowest concentration of SARS-CoV-2 viral copies this assay can detect is 138 copies/mL. A negative result does not preclude SARS-Cov-2 infection and should not be used as the sole basis for treatment or other patient management decisions. A negative result may occur with  improper specimen collection/handling, submission of specimen other than nasopharyngeal swab, presence of viral mutation(s) within the areas targeted by this assay, and inadequate number of viral copies(<138 copies/mL). A negative result must be combined with clinical observations, patient history, and  epidemiological information. The expected result is Negative.  Fact Sheet for Patients:  EntrepreneurPulse.com.au  Fact Sheet for Healthcare Providers:  IncredibleEmployment.be  This test is no t yet approved or cleared by the [place] FDA and  has been authorized for detection and/or diagnosis of SARS-CoV-2 by FDA under an Emergency Use Authorization (EUA). This EUA will remain  in effect (meaning this test can be used) for the duration of the COVID-19 declaration under Section 564(b)(1) of the Act, 21 U.S.C.section 360bbb-3(b)(1), unless  the authorization is terminated  or revoked sooner.       Influenza A by PCR NEGATIVE NEGATIVE Final   Influenza B by PCR NEGATIVE NEGATIVE Final    Comment: (NOTE) The Xpert Xpress SARS-CoV-2/FLU/RSV plus assay is intended as an aid in the diagnosis of influenza from Nasopharyngeal swab specimens and should not be used as a sole basis for treatment. Nasal washings and aspirates are unacceptable for Xpert Xpress SARS-CoV-2/FLU/RSV testing.  Fact Sheet for Patients: EntrepreneurPulse.com.au  Fact Sheet for Healthcare Providers: IncredibleEmployment.be  This test is not yet approved or cleared by the [place] FDA and has been authorized for detection and/or diagnosis of SARS-CoV-2 by FDA under an Emergency Use Authorization (EUA). This EUA will remain in effect (meaning this test can be used) for the duration of the COVID-19 declaration under Section 564(b)(1) of the Act, 21 U.S.C. section 360bbb-3(b)(1), unless the authorization is terminated or revoked.  Performed at [facility], [address] [place], [place] [address]   Respiratory (~20 pathogens) panel by PCR     Status: Abnormal   Collection Time: 10/15/21  3:23 PM   Specimen: Nasopharyngeal Swab; Respiratory  Result Value Ref Range Status   Adenovirus NOT DETECTED NOT DETECTED Final   Coronavirus  229E NOT DETECTED NOT DETECTED Final    Comment: (NOTE) The Coronavirus on the Respiratory Panel, DOES NOT test for the novel  Coronavirus (2019 nCoV)    Coronavirus HKU1 NOT DETECTED NOT DETECTED Final   Coronavirus NL63 NOT DETECTED NOT DETECTED Final   Coronavirus OC43 NOT DETECTED NOT DETECTED Final   Metapneumovirus DETECTED (A) NOT DETECTED Final   Rhinovirus / Enterovirus NOT DETECTED NOT DETECTED Final   Influenza A NOT DETECTED NOT DETECTED Final   Influenza B NOT DETECTED NOT DETECTED Final   Parainfluenza Virus 1 NOT DETECTED NOT DETECTED Final   Parainfluenza Virus 2 NOT DETECTED NOT DETECTED Final   Parainfluenza Virus 3 NOT DETECTED NOT DETECTED Final   Parainfluenza Virus 4 NOT DETECTED NOT DETECTED Final   Respiratory Syncytial Virus NOT DETECTED NOT DETECTED Final   Bordetella pertussis NOT DETECTED NOT DETECTED Final   Bordetella Parapertussis NOT DETECTED NOT DETECTED Final   Chlamydophila pneumoniae NOT DETECTED NOT DETECTED Final   Mycoplasma pneumoniae NOT DETECTED NOT DETECTED Final    Comment: Performed at [facility], [address] [place], [place] [address]     Labs: Basic Metabolic Panel: Recent Labs  Lab 10/14/21 2219 10/15/21 0431 10/16/21 0643 10/17/21 0431 10/18/21 0445  NA 128* 134* 136 133* 134*  K 3.9 3.6 4.0 3.8 3.7  CL 95* 97* 94* 94* 94*  CO2 22 25 29  32 31  GLUCOSE 151* 94 76 157* 156*  BUN 22 22 26* 37* 46*  CREATININE 1.72* 1.70* 1.71* 1.80* 1.93*  CALCIUM 8.8* 8.7* 9.1 8.4* 8.7*  MG 1.8 1.8 1.8 1.8 1.9  PHOS  --   --  4.1 4.4 3.5   Liver Function Tests: Recent Labs  Lab 10/15/21 0431 10/16/21 0643 10/17/21 0431 10/18/21 0445  AST 34 44* 38 40  ALT 26 28 26 30   ALKPHOS 62 67 56 53  BILITOT 0.8 1.0 0.9 0.8  PROT 5.9* 6.8 5.9* 5.8*  ALBUMIN 3.5 3.6 3.2* 3.2*   No results for input(s): LIPASE, AMYLASE in the last 168 hours. No results for input(s): AMMONIA in the last 168 hours. CBC: Recent Labs  Lab  10/14/21 2219 10/15/21 0431 10/16/21 0643 10/17/21 0431 10/18/21 0445  WBC  7.0 5.3 5.9 5.8 7.3  NEUTROABS  --  3.7 3.9 4.3 5.6  HGB 14.4 13.8 14.3 13.0 12.9*  HCT 44.9 40.8 43.4 38.5* 39.2  MCV 94.1 94.4 96.2 94.8 92.0  PLT 150 131* 133* 121* 143*   Cardiac Enzymes: No results for input(s): CKTOTAL, CKMB, CKMBINDEX, TROPONINI in the last 168 hours. BNP: BNP (last 3 results) Recent Labs    10/14/21 2219  BNP 511.0*    ProBNP (last 3 results) No results for input(s): PROBNP in the last 8760 hours.  CBG: Recent Labs  Lab 10/17/21 2021 10/18/21 0058 10/18/21 0400 10/18/21 0735 10/18/21 1141  GLUCAP 266* 186* 169* 126* 214*       Signed:  Dia Crawford, MD [facility]

## 2021-10-18 NOTE — Progress Notes (Signed)
SATURATION QUALIFICATIONS: (This note is used to comply with regulatory documentation for home oxygen)  Patient Saturations on Room Air at Rest = 98%  Patient Saturations on Room Air while Ambulating = 96%  Patient Saturations on 0 Liters of oxygen while Ambulating = 96%  Please briefly explain why patient needs home oxygen:   Pt was able to maintain a stable oxygen sat while ambulating without oxygen therapy.

## 2021-10-18 NOTE — Progress Notes (Signed)
Progress Note  Patient Name: Wayne Gordon Date of Encounter: 10/18/2021  [facility] Cardiologist: Cristopher Peru, MD   Subjective  SOB improved, some ongoing cough   Inpatient Medications    Scheduled Meds:  apixaban  2.5 mg Oral BID   carvedilol  12.5 mg Oral BID WC   dapagliflozin propanediol  5 mg Oral Daily   dextromethorphan-guaiFENesin  1 tablet Oral BID   furosemide  40 mg Oral Daily   gabapentin  100 mg Oral TID   insulin aspart  0-9 Units Subcutaneous Q4H   insulin detemir  18 Units Subcutaneous QHS   ipratropium-albuterol  3 mL Nebulization TID   melatonin  6 mg Oral QHS   methylPREDNISolone (SOLU-MEDROL) injection  40 mg Intravenous Daily   pantoprazole  40 mg Oral Daily   rosuvastatin  10 mg Oral QPM   Continuous Infusions:  PRN Meds: acetaminophen **OR** acetaminophen, albuterol, morphine injection, ondansetron **OR** ondansetron (ZOFRAN) IV, oxyCODONE, sodium chloride   Vital Signs    Vitals:   10/17/21 2058 10/18/21 0500 10/18/21 0630 10/18/21 0725  BP:   118/69   Pulse:   72   Resp:   16   Temp:   98.3 F (36.8 C)   TempSrc:   Oral   SpO2: 95%  93% 95%  Weight:  106 kg    Height:        Intake/Output Summary (Last 24 hours) at 10/18/2021 0848 Last data filed at 10/18/2021 0422 Gross per 24 hour  Intake 240 ml  Output 750 ml  Net -510 ml   Last 3 Weights 10/18/2021 10/17/2021 10/16/2021  Weight (lbs) 233 lb 11 oz 234 lb 3.2 oz 235 lb 7.2 oz  Weight (kg) 106 kg 106.232 kg 106.8 kg      Telemetry    V paced - Personally Reviewed  ECG    N/a - Personally Reviewed  Physical Exam   GEN: No acute distress.   Neck: No JVD Cardiac: RRR, no murmurs, rubs, or gallops.  Respiratory: Coarse bilateral bases GI: Soft, nontender, non-distended  MS: No edema; No deformity. Neuro:  Nonfocal  Psych: Normal affect   Labs    High Sensitivity Troponin:  No results for input(s): TROPONINIHS in the last 720 hours.   Chemistry Recent Labs   Lab 10/16/21 0643 10/17/21 0431 10/18/21 0445  NA 136 133* 134*  K 4.0 3.8 3.7  CL 94* 94* 94*  CO2 29 32 31  GLUCOSE 76 157* 156*  BUN 26* 37* 46*  CREATININE 1.71* 1.80* 1.93*  CALCIUM 9.1 8.4* 8.7*  MG 1.8 1.8 1.9  PROT 6.8 5.9* 5.8*  ALBUMIN 3.6 3.2* 3.2*  AST 44* 38 40  ALT 28 26 30   ALKPHOS 67 56 53  BILITOT 1.0 0.9 0.8  GFRNONAA 39* 36* 34*  ANIONGAP 13 7 9     Lipids  Recent Labs  Lab 10/17/21 0431  CHOL 120  TRIG 69  HDL 28*  LDLCALC 78  CHOLHDL 4.3    Hematology Recent Labs  Lab 10/16/21 0643 10/17/21 0431 10/18/21 0445  WBC 5.9 5.8 7.3  RBC 4.51 4.06* 4.26  HGB 14.3 13.0 12.9*  HCT 43.4 38.5* 39.2  MCV 96.2 94.8 92.0  MCH 31.7 32.0 30.3  MCHC 32.9 33.8 32.9  RDW 14.4 14.0 13.7  PLT 133* 121* 143*   Thyroid  Recent Labs  Lab 10/15/21 0431  TSH 1.817    BNP Recent Labs  Lab 10/14/21 2219  BNP 511.0*  DDimer No results for input(s): DDIMER in the last 168 hours.   Radiology    No results found.  Cardiac Studies     Patient Profile     Wayne Gordon is a 88 y.o. male with a hx of HFrEF/ICM (EF 20-25% in 2013 and similar results by repeat echo in 08/2017, VT and sinus node dysfunction (s/p ICD placement in 2013 with upgrade to Biotronik Bi-V ICD in 09/2020), CAD (s/p CABG in 2013 with mitral valve repair), permanent atrial fibrillation, HTN, HLD, Type 2 DM and Stage 3 CKD who is being seen 10/15/2021 for the evaluation of CHF at the request of Dr. Clearence Ped.  Assessment & Plan    1.Acute on chronic systolic HF - Jan [id number] echo LVE 20-25% - 09/2021 echo LVE 25%, RV pressure/volume overload with septal flattening, mod RV dysfunction - admitted with DOE, 5 lbs weight gain(from notes baseline wt 235 lbs). BNP 511   Incomplete I/Os, roughly negative 2.2 L since admission. Reported wt 233 lbs. Had been on IV lasix 60mg  bid, transitioned to oral 40mg  this AM. Uptrend in Cr but consistent with prior ranges. Has been on lasix 40mg  at  home and presented overloaded, would increase oral dosing to 60mg  daily starting with tomorrows dose.   - medical therapy with coreg 12.5mg  bid, farxiga 5,  - GFR 34, not on ARB or aldactone. Of note hx of angioedema on ACEi Soft bp's at times, would avoid hydral/nitrates - GFR 34, would be approprirate for 10mg  of farxiga  2.CAD He is s/p CABG in 2013 - no acute issues   3. VT and Sinus Node Dysfunction - He is s/p ICD placement in 2013 with upgrade to Biotronik Bi-V ICD in 09/2020. Followed by Dr. Lovena Le as an outpatient.    4. Permanent Atrial Fibrillation - Rates have been well-controlled and he remains on Coreg 12.5mg  BID. On Eliquis 2.5mg  BID for anticoagulation which is the appropriate dose given his age of 62 and creatinine > 1.5.   5. Stage 3 CKD -  Creatinine previously at 1.9 - 2.1 in 2022. At 1.72 on admission and stable at 1.70 today. Continue to follow with diuresis.   6. Viral URI + metapneumovirus   Ok for discharge from cardiac standpoitn, we will arrante outpatient f/u. We will sign off inpatient care     For questions or updates, please contact [place] Please consult [email or web address] for contact info under        Signed, Carlyle Dolly, MD  10/18/2021, 8:48 AM

## 2021-10-18 NOTE — Progress Notes (Signed)
Pt has discharge orders, discharge teaching given and no further questions at this time. Pt wheeled down stairs to main lobby via staff by wheelchair.

## 2021-10-18 NOTE — Progress Notes (Incomplete)
PROGRESS NOTE    Wayne Gordon  [id number] DOB: April 25, 1934 DOA: 10/14/2021 PCP: Adaline Sill, NP     Brief Narrative:  88 y.o. WM PMHx Atrial fibrillation, CAD, Ischemic cardiomyopathy (EF 25%) ICD placement Biotronik, Dr. Lovena Le 03/28/12, HTN, HLD, CKD stage IIIb (baseline Cr1.7-2.0), DM type II hold with hyperglycemia, GERD,   Presents to ED with chief complaint of dyspnea.  Patient reports he has dyspnea that started 2 days ago and has been getting worse since it started.  He has associated cough productive of clear sputum, and wheeze.  Patient reports orthopnea and bilateral lower extremity edema as well.  Patient had a 2 pound weight gain on Wednesday, and a 3 pound weight gain on Thursday.  He takes 40 mg of Lasix daily, but increased his Lasix to 100 mg on the day of presentation.  He did not have an increase in urine output.  He reports in fact he has noticed a decrease in urine output from his baseline.  Patient reports no history of COPD, so he has no inhaler use.  That he could attempt to use for his wheezing. Patient denies any fevers, chest pain, palpitations.  Patient denies any rashes, myalgias, diarrhea, constipation, dysuria, hematuria.  Patient has no other complaints at this time.     Subjective: 2/20 afebrile overnight, now on room air   Assessment & Plan: Covid vaccination; 2/3 vaccinated   Principal Problem:   Acute respiratory failure with hypoxia ([place]) Active Problems:   HTN (hypertension)   Hyperlipidemia   Atrial fibrillation (HCC)   Type 2 diabetes mellitus without complication (HCC)   CHF exacerbation (HCC)   CAD (coronary artery disease) of artery bypass graft   History of implantable cardioverter-defibrillator (ICD) placement   Benign essential HTN   Chronic kidney disease, stage 3b (HCC)   Human metapneumovirus pneumonia   Acute on chronic systolic CHF (congestive heart failure) (HCC)    Acute respiratory failure with hypoxia/positive  metapneumovirus pneumonia (present on admission) -O2 sats down to 87% -No O2 requirement at baseline -Continuous pulse ox monitoring.  Titrate O2 to maintain SPO2 > 92%  -DuoNeb TID - Flutter valve - Incentive spirometry - Solu-Medrol 40 mg daily - Mucinex DM BID -May be secondary to CHF exacerbation, or given his exposure to ill patients viral infection. -Low suspicion for bacterial pneumonia as there is no leukocytosis, no fever, no pneumonia finding on chest x-ray -Patient has no chest pain, no changes on EKG-troponin not indicated at this time -Patient is not tachycardic, blood pressure stable, peripheral edema is symmetrical, very low suspicion for clot and D-dimer if patient becomes tachycardic or hypotensive, or suspicion increases for PE -Repeat chest x-ray in the a.m. -2/17: Positive metapneumovirus see results below    Acute on chronic systolic CHF - Longstanding cardiomyopathy with EF 20-25% in 2013 and similar results by repeat echo in 08/2017 but also noted to have moderate pulmonary HTN at that time. Admitted with worsening dyspnea on exertion and an associated 5 lb weight gain on his home scales.  - BNP elevated to 511 on admission. -Anticipate he will require IV diuresis for 1-2 additional days as his baseline weight is around 235 lbs(106.5 kg)  -2/19 at baseline weight and at 242 lbs today.  -2/19 decrease Lasix p.o. 40 mg daily  -Coreg 12.5 mg BID - 12/19 per patient was taken off losartan by his cardiologist and given his low blood pressure we will discontinue -2/19 DC hydralazine 25 mg TID -2/19  per cardiology recommendations start Farxiga 5 mg daily we will have to monitor closely as his GFR is borderline -2/17 Echocardiogram shows that patient's cardiac function overall there is no significant decrease see results below -Strict in and out -2.0 L, 2/18 - Daily weight Filed Weights   10/16/21 0500 10/17/21 0531 10/18/21 0500  Weight: 106.8 kg 106.2 kg 106 kg  -2/19  at base weight   CAD - He is s/p CABG in 2013. He reports dyspnea on exertion in the setting of fluid overload but no symptoms prior to this and no recent chest pain.  -See CHF  Mitral valve repair - He underwent MV repair at that time of CABG in 2013. Echo in 2019 showed mild MR and mild to moderate TR.   VT and sinus node dysfunction - He is s/p ICD placement in 2013 with upgrade to Biotronik Bi-V ICD in 09/2020. Followed by Dr. Lovena Le as an outpatient.    Permanent Atrial fibrillation -2/18 currently NSR - Eliquis 2.5 mg BID -See CHF  Essential HTN (hypertension)- (present on admission) -See CHF  CKD stage 3B (baseline Cr 1.9 - 2.1 in 2022).  -At 1.72 on admission and stable at 1.70 today. Continue to follow with diuresis.  Lab Results  Component Value Date   CREATININE 1.93 (H) 10/18/2021   CREATININE 1.80 (H) 10/17/2021   CREATININE 1.71 (H) 10/16/2021   CREATININE 1.70 (H) 10/15/2021   CREATININE 1.72 (H) 10/14/2021  -At baseline   DM type II uncontrolled with complication on long-term insulin  - 2/19 DC sensitive SSI  -2/19 restart Levemir 18 units (home dose Lantus 18 units)   HLD(present on admission) - 2/18 LDL=78: Goal is LDL<70 -2/19 increased Crestor 10 mg  Goals of care - 2/19 PT/OT evaluate patient for SNF vs HH     physical Exam:  General: A/O x4, negative acute respiratory distress Eyes: negative scleral hemorrhage, negative anisocoria, negative icterus ENT: Negative Runny nose, negative gingival bleeding, Neck:  Negative scars, masses, torticollis, lymphadenopathy, JVD Lungs: decreased bibasilar breath sounds, positive mild expiratory wheezes  Cardiovascular: Regular rate and rhythm without murmur gallop or rub normal S1 and S2 Abdomen: negative abdominal pain, nondistended, positive soft, bowel sounds, no rebound, no ascites, no appreciable mass Extremities: No significant cyanosis, clubbing, or edema bilateral lower extremities Skin: Negative  rashes, lesions, ulcers Psychiatric:  Negative depression, negative anxiety, negative fatigue, negative mania  Central nervous system:  Cranial nerves II through XII intact, tongue/uvula midline, all extremities muscle strength 5/5, sensation intact throughout, negative dysarthria, negative expressive aphasia, negative receptive aphasia.       DVT prophylaxis: Eliquis Code Status: Full Family Communication: 2/18 wife at bedside for discussion of plan of care all questions answered Status is: Inpatient    Dispo: The patient is from: Home              Anticipated d/c is to: Home              Anticipated d/c date is: 1-2 days              Patient currently is not medically stable to d/c.      Consultants:  Cardiology Dr. Harrington Challenger     Procedures/Significant Events:  2/17 Echocardiogram; left Ventricle: Septal flattening consistent with RV volume / pressure  overload. Diffuse hypokinesis, inferior , basal inferoseptal akinesis.  -LVEF=25%%. The left ventricle has severely decreased function.  -The left ventricular internal cavity size was severely dilated. There is mild left ventricular  hypertrophy.  -Left ventricular diastolic parameters are indeterminate.  Right Ventricle: The right ventricular size is moderately enlarged. Right ventricular systolic  function is moderately reduced.  -Moderately elevated PASP estimated right ventricular systolic pressure is 54.7 mmHg.  Left Atrium: Left atrial size was moderately dilated.    I have personally reviewed and interpreted all radiology studies and my findings are as above.  VENTILATOR SETTINGS: Nasal cannula 2/19 Flow 3 L/min SPO2 98%  Cultures   Antimicrobials: 2/16 influenza A/B negative 2/17 respiratory virus panel positive metapneumovirus   Devices    LINES / TUBES:      Continuous Infusions:   Objective: Vitals:   10/17/21 2058 10/18/21 0500 10/18/21 0630 10/18/21 0725  BP:   118/69   Pulse:   72   Resp:    16   Temp:   98.3 F (36.8 C)   TempSrc:   Oral   SpO2: 95%  93% 95%  Weight:  106 kg    Height:        Intake/Output Summary (Last 24 hours) at 10/18/2021 2449 Last data filed at 10/18/2021 0422 Gross per 24 hour  Intake 240 ml  Output 750 ml  Net -510 ml    Filed Weights   10/16/21 0500 10/17/21 0531 10/18/21 0500  Weight: 106.8 kg 106.2 kg 106 kg    Examination:  General: A/O x4, positive acute respiratory distress Eyes: negative scleral hemorrhage, negative anisocoria, negative icterus ENT: Negative Runny nose, negative gingival bleeding, Neck:  Negative scars, masses, torticollis, lymphadenopathy, JVD Lungs: decreased breath sounds bilaterally, positive inspiratory/expiratory wheezes Cardiovascular: Regular rate and rhythm without murmur gallop or rub normal S1 and S2 Abdomen: negative abdominal pain, nondistended, positive soft, bowel sounds, no rebound, no ascites, no appreciable mass Extremities: No significant cyanosis, clubbing, or edema bilateral lower extremities Skin: Negative rashes, lesions, ulcers Psychiatric:  Negative depression, negative anxiety, negative fatigue, negative mania  Central nervous system:  Cranial nerves II through XII intact, tongue/uvula midline, all extremities muscle strength 5/5, sensation intact throughout, negative dysarthria, negative expressive aphasia, negative receptive aphasia.  .     Data Reviewed: Care during the described time interval was provided by me .  I have reviewed this patient's available data, including medical history, events of note, physical examination, and all test results as part of my evaluation.  CBC: Recent Labs  Lab 10/14/21 2219 10/15/21 0431 10/16/21 0643 10/17/21 0431 10/18/21 0445  WBC 7.0 5.3 5.9 5.8 7.3  NEUTROABS  --  3.7 3.9 4.3 5.6  HGB 14.4 13.8 14.3 13.0 12.9*  HCT 44.9 40.8 43.4 38.5* 39.2  MCV 94.1 94.4 96.2 94.8 92.0  PLT 150 131* 133* 121* 143*    Basic Metabolic Panel: Recent  Labs  Lab 10/14/21 2219 10/15/21 0431 10/16/21 0643 10/17/21 0431 10/18/21 0445  NA 128* 134* 136 133* 134*  K 3.9 3.6 4.0 3.8 3.7  CL 95* 97* 94* 94* 94*  CO2 22 25 29  32 31  GLUCOSE 151* 94 76 157* 156*  BUN 22 22 26* 37* 46*  CREATININE 1.72* 1.70* 1.71* 1.80* 1.93*  CALCIUM 8.8* 8.7* 9.1 8.4* 8.7*  MG 1.8 1.8 1.8 1.8 1.9  PHOS  --   --  4.1 4.4 3.5    GFR: Estimated Creatinine Clearance: 37.4 mL/min (A) (by C-G formula based on SCr of 1.93 mg/dL (H)). Liver Function Tests: Recent Labs  Lab 10/15/21 0431 10/16/21 0643 10/17/21 0431 10/18/21 0445  AST 34 44* 38 40  ALT 26 28 26  30  ALKPHOS 62 67 56 53  BILITOT 0.8 1.0 0.9 0.8  PROT 5.9* 6.8 5.9* 5.8*  ALBUMIN 3.5 3.6 3.2* 3.2*    No results for input(s): LIPASE, AMYLASE in the last 168 hours. No results for input(s): AMMONIA in the last 168 hours. Coagulation Profile: No results for input(s): INR, PROTIME in the last 168 hours. Cardiac Enzymes: No results for input(s): CKTOTAL, CKMB, CKMBINDEX, TROPONINI in the last 168 hours. BNP (last 3 results) No results for input(s): PROBNP in the last 8760 hours. HbA1C: No results for input(s): HGBA1C in the last 72 hours.  CBG: Recent Labs  Lab 10/17/21 1651 10/17/21 2021 10/18/21 0058 10/18/21 0400 10/18/21 0735  GLUCAP 230* 266* 186* 169* 126*    Lipid Profile: Recent Labs    10/16/21 0643 10/17/21 0431  CHOL 133 120  HDL 32* 28*  LDLCALC 84 78  TRIG 83 69  CHOLHDL 4.2 4.3    Thyroid Function Tests: No results for input(s): TSH, T4TOTAL, FREET4, T3FREE, THYROIDAB in the last 72 hours.  Anemia Panel: No results for input(s): VITAMINB12, FOLATE, FERRITIN, TIBC, IRON, RETICCTPCT in the last 72 hours. Sepsis Labs: No results for input(s): PROCALCITON, LATICACIDVEN in the last 168 hours.  Recent Results (from the past 240 hour(s))  Resp Panel by RT-PCR (Flu A&B, Covid) Nasopharyngeal Swab     Status: None   Collection Time: 10/14/21 10:50 PM    Specimen: Nasopharyngeal Swab; Nasopharyngeal(NP) swabs in vial transport medium  Result Value Ref Range Status   SARS Coronavirus 2 by RT PCR NEGATIVE NEGATIVE Final    Comment: (NOTE) SARS-CoV-2 target nucleic acids are NOT DETECTED.  The SARS-CoV-2 RNA is generally detectable in upper respiratory specimens during the acute phase of infection. The lowest concentration of SARS-CoV-2 viral copies this assay can detect is 138 copies/mL. A negative result does not preclude SARS-Cov-2 infection and should not be used as the sole basis for treatment or other patient management decisions. A negative result may occur with  improper specimen collection/handling, submission of specimen other than nasopharyngeal swab, presence of viral mutation(s) within the areas targeted by this assay, and inadequate number of viral copies(<138 copies/mL). A negative result must be combined with clinical observations, patient history, and epidemiological information. The expected result is Negative.  Fact Sheet for Patients:  EntrepreneurPulse.com.au  Fact Sheet for Healthcare Providers:  IncredibleEmployment.be  This test is no t yet approved or cleared by the [place] FDA and  has been authorized for detection and/or diagnosis of SARS-CoV-2 by FDA under an Emergency Use Authorization (EUA). This EUA will remain  in effect (meaning this test can be used) for the duration of the COVID-19 declaration under Section 564(b)(1) of the Act, 21 U.S.C.section 360bbb-3(b)(1), unless the authorization is terminated  or revoked sooner.       Influenza A by PCR NEGATIVE NEGATIVE Final   Influenza B by PCR NEGATIVE NEGATIVE Final    Comment: (NOTE) The Xpert Xpress SARS-CoV-2/FLU/RSV plus assay is intended as an aid in the diagnosis of influenza from Nasopharyngeal swab specimens and should not be used as a sole basis for treatment. Nasal washings and aspirates are  unacceptable for Xpert Xpress SARS-CoV-2/FLU/RSV testing.  Fact Sheet for Patients: EntrepreneurPulse.com.au  Fact Sheet for Healthcare Providers: IncredibleEmployment.be  This test is not yet approved or cleared by the [place] FDA and has been authorized for detection and/or diagnosis of SARS-CoV-2 by FDA under an Emergency Use Authorization (EUA). This EUA will remain in effect (meaning  this test can be used) for the duration of the COVID-19 declaration under Section 564(b)(1) of the Act, 21 U.S.C. section 360bbb-3(b)(1), unless the authorization is terminated or revoked.  Performed at [facility], [address], [place], [place] [address]   Respiratory (~20 pathogens) panel by PCR     Status: Abnormal   Collection Time: 10/15/21  3:23 PM   Specimen: Nasopharyngeal Swab; Respiratory  Result Value Ref Range Status   Adenovirus NOT DETECTED NOT DETECTED Final   Coronavirus 229E NOT DETECTED NOT DETECTED Final    Comment: (NOTE) The Coronavirus on the Respiratory Panel, DOES NOT test for the novel  Coronavirus (2019 nCoV)    Coronavirus HKU1 NOT DETECTED NOT DETECTED Final   Coronavirus NL63 NOT DETECTED NOT DETECTED Final   Coronavirus OC43 NOT DETECTED NOT DETECTED Final   Metapneumovirus DETECTED (A) NOT DETECTED Final   Rhinovirus / Enterovirus NOT DETECTED NOT DETECTED Final   Influenza A NOT DETECTED NOT DETECTED Final   Influenza B NOT DETECTED NOT DETECTED Final   Parainfluenza Virus 1 NOT DETECTED NOT DETECTED Final   Parainfluenza Virus 2 NOT DETECTED NOT DETECTED Final   Parainfluenza Virus 3 NOT DETECTED NOT DETECTED Final   Parainfluenza Virus 4 NOT DETECTED NOT DETECTED Final   Respiratory Syncytial Virus NOT DETECTED NOT DETECTED Final   Bordetella pertussis NOT DETECTED NOT DETECTED Final   Bordetella Parapertussis NOT DETECTED NOT DETECTED Final   Chlamydophila pneumoniae NOT DETECTED NOT DETECTED Final   Mycoplasma  pneumoniae NOT DETECTED NOT DETECTED Final    Comment: Performed at [facility], [address], [place], [place] [address]          Radiology Studies: No results found.      Scheduled Meds:  apixaban  2.5 mg Oral BID   carvedilol  12.5 mg Oral BID WC   dapagliflozin propanediol  10 mg Oral Daily   dextromethorphan-guaiFENesin  1 tablet Oral BID   [START ON 10/19/2021] furosemide  60 mg Oral Daily   gabapentin  100 mg Oral TID   insulin aspart  0-9 Units Subcutaneous Q4H   insulin detemir  18 Units Subcutaneous QHS   ipratropium-albuterol  3 mL Nebulization TID   melatonin  6 mg Oral QHS   methylPREDNISolone (SOLU-MEDROL) injection  40 mg Intravenous Daily   pantoprazole  40 mg Oral Daily   rosuvastatin  10 mg Oral QPM   Continuous Infusions:   LOS: 4 days    Time spent:40 min    Krishna Dancel, Geraldo Docker, MD [facility]   If 7PM-7AM, please contact night-coverage 10/18/2021, 9:09 AM

## 2021-10-18 NOTE — Progress Notes (Signed)
OT Cancellation Note  Patient Details Name: Wayne Gordon MRN: [id number] DOB: Dec 10, 1933   Cancelled Treatment:    Reason Eval/Treat Not Completed: OT screened, no needs identified, will sign off   Limmie Patricia, OTR/L,CBIS  [phone]  10/18/2021, 8:33 AM

## 2021-10-20 ENCOUNTER — Telehealth: Payer: Self-pay | Admitting: *Deleted

## 2021-10-20 NOTE — Telephone Encounter (Signed)
Pt notified that he must spend $720 out of pocket before he is approved for the BMS (Eliquis) patient assistance.

## 2021-11-24 LAB — CUP PACEART REMOTE DEVICE CHECK
Battery Voltage: 3.07 V
Date Time Interrogation Session: 20230328092731
Implantable Lead Implant Date: 20130731
Implantable Lead Implant Date: 20220214
Implantable Lead Implant Date: 20220214
Implantable Lead Location: 753858
Implantable Lead Location: 753859
Implantable Lead Location: 753860
Implantable Lead Model: 365
Implantable Lead Model: 377
Implantable Lead Model: 3830
Implantable Lead Serial Number: 10505793
Implantable Lead Serial Number: 8000207354
Implantable Pulse Generator Implant Date: 20220214
Lead Channel Setting Pacing Amplitude: 2.5 V
Lead Channel Setting Pacing Amplitude: 3 V
Lead Channel Setting Pacing Pulse Width: 0.4 ms
Lead Channel Setting Pacing Pulse Width: 0.4 ms
Lead Channel Setting Sensing Sensitivity: 0.8 mV
Lead Channel Setting Sensing Sensitivity: 1.6 mV
Pulse Gen Model: 429553
Pulse Gen Serial Number: 84824950

## 2021-11-25 ENCOUNTER — Ambulatory Visit (INDEPENDENT_AMBULATORY_CARE_PROVIDER_SITE_OTHER): Payer: Medicare HMO

## 2021-11-25 DIAGNOSIS — I442 Atrioventricular block, complete: Secondary | ICD-10-CM | POA: Diagnosis not present

## 2021-11-26 ENCOUNTER — Telehealth: Payer: Self-pay | Admitting: *Deleted

## 2021-11-26 NOTE — Telephone Encounter (Signed)
Patient with diagnosis of afib on Eliquis for anticoagulation.   ? ?Procedure: spinal cord stimulator ?Date of procedure: TBD ? ?CHA2DS2-VASc Score = 6  ?This indicates a 9.7% annual risk of stroke. ?The patient's score is based upon: ?CHF History: 1 ?HTN History: 1 ?Diabetes History: 1 ?Stroke History: 0 ?Vascular Disease History: 1 ?Age Score: 2 ?Gender Score: 0 ?  ?CrCl 40mL/min ?Platelet count 143K ? ?Per office protocol, patient can hold Eliquis for 3 days prior to procedure.   ?

## 2021-11-26 NOTE — Telephone Encounter (Signed)
Pt is agreeable to plan of care for tele pre op appt 11/29/21 @ 3:40. Med rec and consent are done.  ?

## 2021-11-26 NOTE — Telephone Encounter (Signed)
Pt is agreeable to plan of care for tele pre op appt 11/29/21 @ 3:40. Med rec and consent are done.  ? ?  ?Patient Consent for Virtual Visit  ? ? ?   ? ?Wayne Gordon has provided verbal consent on 11/26/2021 for a virtual visit (video or telephone). ? ? ?CONSENT FOR VIRTUAL VISIT FOR:  Wayne Gordon  ?By participating in this virtual visit I agree to the following: ? ?I hereby voluntarily request, consent and authorize [facility] and its employed or contracted physicians, physician assistants, nurse practitioners or other licensed health care professionals (the Practitioner), to provide me with telemedicine health care services (the ?Services") as deemed necessary by the treating Practitioner. I acknowledge and consent to receive the Services by the Practitioner via telemedicine. I understand that the telemedicine visit will involve communicating with the Practitioner through live audiovisual communication technology and the disclosure of certain medical information by electronic transmission. I acknowledge that I have been given the opportunity to request an in-person assessment or other available alternative prior to the telemedicine visit and am voluntarily participating in the telemedicine visit. ? ?I understand that I have the right to withhold or withdraw my consent to the use of telemedicine in the course of my care at any time, without affecting my right to future care or treatment, and that the Practitioner or I may terminate the telemedicine visit at any time. I understand that I have the right to inspect all information obtained and/or recorded in the course of the telemedicine visit and may receive copies of available information for a reasonable fee.  I understand that some of the potential risks of receiving the Services via telemedicine include:  ?Delay or interruption in medical evaluation due to technological equipment failure or disruption; ?Information transmitted may not be sufficient (e.g.  poor resolution of images) to allow for appropriate medical decision making by the Practitioner; and/or  ?In rare instances, security protocols could fail, causing a breach of personal health information. ? ?Furthermore, I acknowledge that it is my responsibility to provide information about my medical history, conditions and care that is complete and accurate to the best of my ability. I acknowledge that Practitioner's advice, recommendations, and/or decision may be based on factors not within their control, such as incomplete or inaccurate data provided by me or distortions of diagnostic images or specimens that may result from electronic transmissions. I understand that the practice of medicine is not an exact science and that Practitioner makes no warranties or guarantees regarding treatment outcomes. I acknowledge that a copy of this consent can be made available to me via my patient portal [name]), or I can request a printed copy by calling the office of [facility].   ? ?I understand that my insurance will be billed for this visit.  ? ?I have read or had this consent read to me. ?I understand the contents of this consent, which adequately explains the benefits and risks of the Services being provided via telemedicine.  ?I have been provided ample opportunity to ask questions regarding this consent and the Services and have had my questions answered to my satisfaction. ?I give my informed consent for the services to be provided through the use of telemedicine in my medical care ? ? ? ?

## 2021-11-26 NOTE — Telephone Encounter (Signed)
Preoperative team, please contact this patient and set up a phone call appointment for further cardiac evaluation.  Thank you for your help. ? ?Wayne Gordon. Wayne Hobby NP-C ? ?  ?11/26/2021, 4:16 PM ?[facility] ?[address] ?Office [phone] Fax [phone] ? ?

## 2021-11-26 NOTE — Telephone Encounter (Signed)
? ?  Pre-operative Risk Assessment  ?  ?Patient Name: Wayne Gordon  ?DOB: 1934/08/18 ?MRN: [id number]  ? ?  ?I HAVE VERIFIED THE PT WITH THE REQUESTING OFFICE AS THERE WAS SOME CONFLICTING INFORMATION IN REGARD TO THE PT NAME ON THE CLEARANCE REQUEST.  ? ?ALSO DEVICE CLEARANCE IS BEING REQUESTED. I WILL FORWARD THE NOTES TO DEVICE CLINIC AS WELL ?Request for Surgical Clearance   ? ?Procedure:   SPINAL CORD STIMULATOR ? ?Date of Surgery:  Clearance TBD URGENT                             ?   ?Surgeon:  Roseanna Rainbow, NP ?Surgeon's Group or Practice Name:  [facility] ?Phone number:  [phone] ?Fax number:  [phone] ?  ?Type of Clearance Requested:   ?- Medical  ?- Pharmacy:  Hold Apixaban (Eliquis)   ?DEVICE CLEARANCE BEING REQUESTED AS WELL  ?Type of Anesthesia:  Not Indicated ?  ?Additional requests/questions:   ? ?Signed, ?Danielle Rankin   ?11/26/2021, 1:10 PM  ? ?

## 2021-11-29 ENCOUNTER — Ambulatory Visit (INDEPENDENT_AMBULATORY_CARE_PROVIDER_SITE_OTHER): Payer: Medicare HMO | Admitting: Physician Assistant

## 2021-11-29 DIAGNOSIS — Z01810 Encounter for preprocedural cardiovascular examination: Secondary | ICD-10-CM

## 2021-11-29 NOTE — Progress Notes (Signed)
? ?Virtual Visit via Telephone Note  ? ?This visit type was conducted due to national recommendations for restrictions regarding the COVID-19 Pandemic (e.g. social distancing) in an effort to limit this patient's exposure and mitigate transmission in our community.  Due to his co-morbid illnesses, this patient is at least at moderate risk for complications without adequate follow up.  This format is felt to be most appropriate for this patient at this time.  The patient did not have access to video technology/had technical difficulties with video requiring transitioning to audio format only (telephone).  All issues noted in this document were discussed and addressed.  No physical exam could be performed with this format.  Please refer to the patient's chart for his  consent to telehealth for [facility]. ? ?Evaluation Performed:  Preoperative cardiovascular risk assessment ?_____________  ? ?Date:  11/29/2021  ? ?Patient ID:  Wayne Gordon, DOB 01-21-1934, MRN [id number] ?Patient Location:  ?Home ?Provider location:   ?Office ? ?Primary Care Provider:  Adaline Sill, NP ?Primary Cardiologist:  Cristopher Peru, MD ? ?Chief Complaint  ?  ?88 y.o. y/o male with a h/o CAD s/p CABG with MVR 2013, ICM with baseline EF 20-25%, VT, sinus node dysfunction s/p ICD 2013 with upgrade of Biotronik BiV ICD 09/2020, permanent atrial fibrillation, HTN, HLD, DM II and stage III CKD, who is pending spinal cord stimulator, and presents today for telephonic preoperative cardiovascular risk assessment. ? ?Past Medical History  ?  ?Past Medical History:  ?Diagnosis Date  ? Allergy to ACE inhibitors   ? Angioedema many years ago; patient has tolerated Losartan (ARB) in the past - it was held during [facility] for GI bleed and worsening renal function >> resume Losartan 25 mg QD 06/2015  ? Arthritis   ? Atrial fibrillation ([place])   ? PAF, CHADs2Vasc = 5  ? Carotid artery disease ([place])   ? [id number] LICA  ? CHF (congestive heart failure) ([place])    ? CKD (chronic kidney disease)   ? Coronary artery disease   ? Diabetes mellitus   ? GERD (gastroesophageal reflux disease)   ? Hyperlipidemia   ? Hypertension   ? ICD (implantable cardiac defibrillator) in place 03/28/2012  ? Biotronik, Dr. Lovena Le 03/28/12  ? Ischemic cardiomyopathy   ? S/P CABG x 3; EF 25%  ? Mitral regurgitation   ? S/P mitral valve repair 2013  ? Myocardial infarction [facility])   ? Renal insufficiency   ? ?Past Surgical History:  ?Procedure Laterality Date  ? BIV UPGRADE N/A 10/12/2020  ? Procedure: BIV ICD UPGRADE;  Surgeon: Evans Lance, MD;  Location: [place] [facility];  Service: Cardiovascular;  Laterality: N/A;  ? CHOLECYSTECTOMY N/A 09/20/2017  ? Procedure: LAPAROSCOPIC SUBTOTAL CHOLECYSTECTOMY;  Surgeon: Ralene Ok, MD;  Location: [employer];  Service: General;  Laterality: N/A;  ? COLONOSCOPY N/A 04/24/2014  ? RMR Pancolonic diverticulosis  ? CORONARY ARTERY BYPASS GRAFT  10/17/2011  ? Procedure: CORONARY ARTERY BYPASS GRAFTING (CABG);  Surgeon: Rexene Alberts, MD;  Location: [place];  Service: Open Heart Surgery;  Laterality: N/A;  ? ERCP N/A 09/29/2017  ? Procedure: ENDOSCOPIC RETROGRADE CHOLANGIOPANCREATOGRAPHY (ERCP);  Surgeon: Carol Ada, MD;  Location: [place];  Service: Endoscopy;  Laterality: N/A;  ? ESOPHAGOGASTRODUODENOSCOPY N/A 04/24/2014  ? RMR Subtle nodularity the gastric mucosa of uncertain significance-status post biopsy. Hiatal hernia. chronic inflammation, no H.pylori  ? ESOPHAGOGASTRODUODENOSCOPY N/A 06/24/2016  ? Dr. Gala Romney: normal esophagus, small hiatal hernia, normal duodenum  ? ICD  03/28/2012  ? IMPLANTABLE CARDIOVERTER DEFIBRILLATOR IMPLANT N/A 03/28/2012  ? Procedure: IMPLANTABLE CARDIOVERTER DEFIBRILLATOR IMPLANT;  Surgeon: Evans Lance, MD;  Location: [facility];  Service: Cardiovascular;  Laterality: N/A;  ? KNEE ARTHROSCOPY  ~ 2008  ? right  ? LEFT AND RIGHT HEART CATHETERIZATION WITH CORONARY ANGIOGRAM N/A 10/12/2011  ? Procedure: LEFT AND RIGHT HEART  CATHETERIZATION WITH CORONARY ANGIOGRAM;  Surgeon: Wellington Hampshire, MD;  Location: [place] CATH LAB;  Service: Cardiovascular;  Laterality: N/A;  ? MITRAL VALVE REPAIR  10/17/2011  ? Procedure: MITRAL VALVE REPAIR (MVR);  Surgeon: Rexene Alberts, MD;  Location: [place];  Service: Open Heart Surgery;  Laterality: N/A;  ? NASAL SINUS SURGERY  1990's  ? right  ? ? ?Allergies ? ?Allergies  ?Allergen Reactions  ? Ace Inhibitors Swelling and Other (See Comments)  ?  Kidney issues; angioedema per PMH in chart ?  ? Penicillins Palpitations  ?    ? ? ?History of Present Illness  ?  ?Wayne Gordon is a 88 y.o. male who presents via Engineer, civil (consulting) for a telehealth visit today.  Pt was last seen in cardiology clinic on 01/13/2021 by Dr. Lovena Le.  At that time Wayne Gordon was doing well.  The patient is now pending spinal cord stimulator surgery.  Since his last visit, he was admitted recently due to acute shortness of breath.  He was felt to be volume overloaded and diagnosed with upper respiratory viral infection.  Viral panel positive for metapneumovirus.  Since discharge, his breathing has returned back to his baseline.  He cannot walk long distance due to significant back issue.  Otherwise, he denies any chest pain or worsening dyspnea.  He is weight is about 235 pounds which is his dry weight.  Overall, he is doing well from the cardiac perspective. ? ? ?Home Medications  ?  ?Prior to Admission medications   ?Medication Sig Start Date End Date Taking? Authorizing Provider  ?acetaminophen (TYLENOL) 325 MG tablet Take 2 tablets (650 mg total) by mouth every 6 (six) hours as needed for mild pain (or Fever >/= 101). 10/18/21   Allie Bossier, MD  ?albuterol (VENTOLIN HFA) 108 (90 Base) MCG/ACT inhaler Inhale 2 puffs into the lungs every 6 (six) hours as needed for wheezing or shortness of breath. ?Patient not taking: Reported on 11/26/2021 10/18/21   Allie Bossier, MD  ?CALCIUM-MAGNESIUM-ZINC PO Take 2 tablets by mouth  daily.    [provider]  ?carvedilol (COREG) 12.5 MG tablet Take 1 tablet (12.5 mg total) by mouth 2 (two) times daily with a meal. 10/18/21   Allie Bossier, MD  ?Cholecalciferol (VITAMIN D3) 10 MCG (400 UNIT) CAPS Take 400 Units by mouth daily.    [provider]  ?Cyanocobalamin (VITAMIN B-12 IJ) Inject 1 mL as directed every 30 (thirty) days.     [provider]  ?dapagliflozin propanediol (FARXIGA) 10 MG TABS tablet Take 1 tablet (10 mg total) by mouth daily. ?Patient not taking: Reported on 11/26/2021 10/19/21   Allie Bossier, MD  ?dextromethorphan-guaiFENesin [facility] DM) 30-600 MG 12hr tablet Take 1 tablet by mouth 2 (two) times daily. 10/18/21   Allie Bossier, MD  ?Arne Cleveland 2.5 MG TABS tablet TAKE (1) TABLET TWICE A DAY. 03/25/21   Evans Lance, MD  ?FIASP FLEXTOUCH 100 UNIT/ML FlexTouch Pen Inject 6 Units into the skin every evening. 07/09/21   [provider]  ?furosemide (LASIX) 40 MG tablet Take 40 mg  by mouth daily. 03/09/20   [provider]  ?gabapentin (NEURONTIN) 100 MG capsule Take 100 mg by mouth 3 (three) times daily.    [provider]  ?glipiZIDE (GLUCOTROL) 10 MG tablet Take 10 mg by mouth 2 (two) times daily. 06/10/21   [provider]  ?Insulin Glargine (TOUJEO SOLOSTAR) 300 UNIT/ML SOPN Inject 18 Units into the skin daily. 04/27/15   [provider]  ?Multiple Vitamins-Minerals (MULTIVITAMINS THER. W/MINERALS) TABS Take 1 tablet by mouth daily.    [provider]  ?NIACIN CR PO Take 400 mg by mouth daily.    [provider]  ?omeprazole (PRILOSEC) 40 MG capsule Take 1 capsule (40 mg total) by mouth daily before breakfast. 02/27/20   Erenest Rasher, PA-C  ?ondansetron (ZOFRAN) 4 MG tablet Take 1 tablet (4 mg total) by mouth every 6 (six) hours as needed for nausea. ?Patient not taking: Reported on 11/26/2021 10/18/21   Allie Bossier, MD  ?oxyCODONE (OXY IR/ROXICODONE) 5 MG immediate release tablet  Take 1 tablet (5 mg total) by mouth every 4 (four) hours as needed for moderate pain. ?Patient not taking: Reported on 11/26/2021 10/18/21   Allie Bossier, MD  ?Riboflavin (VITAMIN B-2 PO) Take 1 tablet by

## 2021-11-29 NOTE — Telephone Encounter (Signed)
Spinal cord form given to Ancil Boozer for GT to complete.  ?

## 2021-12-07 NOTE — Progress Notes (Signed)
Remote ICD transmission.   

## 2021-12-23 ENCOUNTER — Telehealth: Payer: Self-pay | Admitting: Internal Medicine

## 2021-12-23 NOTE — Telephone Encounter (Signed)
Patient called to check to see if the office has gotten any updates about his Eliquis application ?

## 2021-12-24 NOTE — Telephone Encounter (Signed)
Pt was approved for Eliquis through [employer] PAF.  ? ?Left a message for pt to call office back regarding approval of PA for Eliquis.  ?

## 2021-12-24 NOTE — Telephone Encounter (Signed)
Patient returning call to [facility] about Eliquis paperwork  ?

## 2021-12-24 NOTE — Telephone Encounter (Signed)
Pt notified of approval for assitance with Eliquis. Pt had no questions or concerns at this time.  ?

## 2021-12-28 ENCOUNTER — Telehealth: Payer: Self-pay | Admitting: Internal Medicine

## 2021-12-28 NOTE — Telephone Encounter (Signed)
Farxiga patient assistance form printed and mailed to patient.  ?

## 2021-12-28 NOTE — Telephone Encounter (Signed)
Patient is calling about FARXIGA paperwork that was suppose to be sent to him. He states he still hasn't received it.  ?

## 2022-01-05 NOTE — Telephone Encounter (Signed)
Spoke with patient regarding Wayne Gordon patient assistance forms. ? ?Patient states he received the patient assistance forms mailed to him and returned them to the [place] office in [place] to put in [employer].  ? ?Patient is calling for an update. Will route to [employer], LPN and [place] triage to follow-up on patient assistance for Farxiga and to call patient with an update. ?

## 2022-01-05 NOTE — Telephone Encounter (Signed)
Pt notified that Marcelline Deist forms were received and faxed to [employer].  ?

## 2022-01-05 NOTE — Telephone Encounter (Signed)
Pt called in to check on the patient assistance for the Farxiga?  He would like to know if Jake Seats has heard anything back?   ? ?Best number [phone] ? ?

## 2022-01-19 ENCOUNTER — Ambulatory Visit: Payer: Medicare HMO | Admitting: Internal Medicine

## 2022-01-19 ENCOUNTER — Encounter: Payer: Self-pay | Admitting: Internal Medicine

## 2022-01-19 VITALS — BP 136/74 | HR 70 | Ht 76.0 in | Wt 242.8 lb

## 2022-01-19 DIAGNOSIS — I4811 Longstanding persistent atrial fibrillation: Secondary | ICD-10-CM

## 2022-01-19 DIAGNOSIS — I5022 Chronic systolic (congestive) heart failure: Secondary | ICD-10-CM | POA: Diagnosis not present

## 2022-01-19 DIAGNOSIS — I255 Ischemic cardiomyopathy: Secondary | ICD-10-CM

## 2022-01-19 NOTE — Patient Instructions (Signed)
Medication Instructions:  Your physician recommends that you continue on your current medications as directed. Please refer to the Current Medication list given to you today.  *If you need a refill on your cardiac medications before your next appointment, please call your pharmacy*   Lab Work: NONE   If you have labs (blood work) drawn today and your tests are completely normal, you will receive your results only by: MyChart Message (if you have MyChart) OR A paper copy in the mail If you have any lab test that is abnormal or we need to change your treatment, we will call you to review the results.   Testing/Procedures: NONE    Follow-Up: At [facility], you and your health needs are our priority.  As part of our continuing mission to provide you with exceptional heart care, we have created designated Provider Care Teams.  These Care Teams include your primary Cardiologist (physician) and Advanced Practice Providers (APPs -  Physician Assistants and Nurse Practitioners) who all work together to provide you with the care you need, when you need it.  We recommend signing up for the patient portal called "MyChart".  Sign up information is provided on this After Visit Summary.  MyChart is used to connect with patients for Virtual Visits (Telemedicine).  Patients are able to view lab/test results, encounter notes, upcoming appointments, etc.  Non-urgent messages can be sent to your provider as well.   To learn more about what you can do with MyChart, go to [email or web address].    Your next appointment:   1 year(s)  The format for your next appointment:   In Person  Provider:   Gregg Taylor, MD    Other Instructions Thank you for choosing [facility]!    Important Information About Sugar       

## 2022-01-19 NOTE — Progress Notes (Signed)
HPI [name] returns for followup. He is a pleasant 88 yo man with a h/o persistent atrial fib, Chronic systolic heart failure, sinus node dysfunction, and underwent upgrade to a biv ICD over a year ago. He feels better. He has not had syncope. His activity had improved but he is limited by back pain and a sore on his left leg. He denies chest pain.     Current Outpatient Medications  Medication Sig Dispense Refill   acetaminophen (TYLENOL) 325 MG tablet Take 2 tablets (650 mg total) by mouth every 6 (six) hours as needed for mild pain (or Fever >/= 101). 30 tablet 0   albuterol (VENTOLIN HFA) 108 (90 Base) MCG/ACT inhaler Inhale 2 puffs into the lungs every 6 (six) hours as needed for wheezing or shortness of breath. 8 g 2   CALCIUM-MAGNESIUM-ZINC PO Take 2 tablets by mouth daily.     carvedilol (COREG) 12.5 MG tablet Take 1 tablet (12.5 mg total) by mouth 2 (two) times daily with a meal. 60 tablet 0   Cholecalciferol (VITAMIN D3) 10 MCG (400 UNIT) CAPS Take 400 Units by mouth daily.     Cyanocobalamin (VITAMIN B-12 IJ) Inject 1 mL as directed every 30 (thirty) days.      dapagliflozin propanediol (FARXIGA) 10 MG TABS tablet Take 1 tablet (10 mg total) by mouth daily. 30 tablet 0   dextromethorphan-guaiFENesin (MUCINEX DM) 30-600 MG 12hr tablet Take 1 tablet by mouth 2 (two) times daily. 30 tablet 0   ELIQUIS 2.5 MG TABS tablet TAKE (1) TABLET TWICE A DAY. 180 tablet 1   FIASP FLEXTOUCH 100 UNIT/ML FlexTouch Pen Inject 6 Units into the skin every evening.     furosemide (LASIX) 40 MG tablet Take 40 mg by mouth daily.     gabapentin (NEURONTIN) 100 MG capsule Take 100 mg by mouth 3 (three) times daily.     glipiZIDE (GLUCOTROL) 10 MG tablet Take 10 mg by mouth 2 (two) times daily.     Insulin Glargine (TOUJEO SOLOSTAR) 300 UNIT/ML SOPN Inject 18 Units into the skin daily.     Multiple Vitamins-Minerals (MULTIVITAMINS THER. W/MINERALS) TABS Take 1 tablet by mouth daily.     NIACIN  CR PO Take 400 mg by mouth daily.     omeprazole (PRILOSEC) 40 MG capsule Take 1 capsule (40 mg total) by mouth daily before breakfast. 90 capsule 1   Riboflavin (VITAMIN B-2 PO) Take 1 tablet by mouth daily.     rosuvastatin (CRESTOR) 10 MG tablet Take 1 tablet (10 mg total) by mouth every evening. (Patient taking differently: Take 5 mg by mouth every evening.) 60 tablet 0   sodium chloride (OCEAN) 0.65 % SOLN nasal spray Place 1 spray into both nostrils as needed for congestion. 15 mL 0   traZODone (DESYREL) 50 MG tablet Take 25-50 mg by mouth at bedtime.     oxyCODONE (OXY IR/ROXICODONE) 5 MG immediate release tablet Take 1 tablet (5 mg total) by mouth every 4 (four) hours as needed for moderate pain. (Patient not taking: Reported on 11/26/2021) 10 tablet 0   No current facility-administered medications for this visit.     Past Medical History:  Diagnosis Date   Allergy to ACE inhibitors    Angioedema many years ago; patient has tolerated Losartan (ARB) in the past - it was held during [facility] for GI bleed and worsening renal function >> resume Losartan 25 mg QD 06/2015   Arthritis    Atrial  fibrillation (HCC)    PAF, CHADs2Vasc = 5   Carotid artery disease (HCC)    60-79% LICA   CHF (congestive heart failure) (HCC)    CKD (chronic kidney disease)    Coronary artery disease    Diabetes mellitus    GERD (gastroesophageal reflux disease)    Hyperlipidemia    Hypertension    ICD (implantable cardiac defibrillator) in place 03/28/2012   B[place]ron[place], Dr. Ladona Ridgel 03/28/12   Ischemic cardiomyopathy    S/P CABG x 3; EF 25%   Mitral regurgitation    S/P mitral valve repair 2013   Myocardial infarction [facility])    Renal insufficiency     ROS:   All systems reviewed and negative except as noted in the HPI.   Past Surgical History:  Procedure Laterality Date   BIV UPGRADE N/A 10/12/2020   Procedure: BIV ICD UPGRADE;  Surgeon: Marinus Maw, MD;  Location: [facility] INVASIVE CV LAB;  Service:  Cardiovascular;  Laterality: N/A;   CHOLECYSTECTOMY N/A 09/20/2017   Procedure: LAPAROSCOPIC SUBTOTAL CHOLECYSTECTOMY;  Surgeon: Axel Filler, MD;  Location: [facility] OR;  Service: General;  Laterality: N/A;   COLONOSCOPY N/A 04/24/2014   RMR Pancolonic diverticulosis   CORONARY ARTERY BYPASS GRAFT  10/17/2011   Procedure: CORONARY ARTERY BYPASS GRAFTING (CABG);  Surgeon: Purcell Nails, MD;  Location: [facility] OR;  Service: Open Heart Surgery;  Laterality: N/A;   ERCP N/A 09/29/2017   Procedure: ENDOSCOPIC RETROGRADE CHOLANGIOPANCREATOGRAPHY (ERCP);  Surgeon: Jeani Hawking, MD;  Location: [facility] ENDOSCOPY;  Service: Endoscopy;  Laterality: N/A;   ESOPHAGOGASTRODUODENOSCOPY N/A 04/24/2014   RMR Subtle nodularity the gastric mucosa of uncertain significance-status post biopsy. Hiatal hernia. chronic inflammation, no H.pylori   ESOPHAGOGASTRODUODENOSCOPY N/A 06/24/2016   Dr. Jena Gauss: normal esophagus, small hiatal hernia, normal duodenum   ICD  03/28/2012   IMPLANTABLE CARDIOVERTER DEFIBRILLATOR IMPLANT N/A 03/28/2012   Procedure: IMPLANTABLE CARDIOVERTER DEFIBRILLATOR IMPLANT;  Surgeon: Marinus Maw, MD;  Location: [facility] CATH LAB;  Service: Cardiovascular;  Laterality: N/A;   KNEE ARTHROSCOPY  ~ 2008   right   LEFT AND RIGHT HEART CATHETERIZATION WITH CORONARY ANGIOGRAM N/A 10/12/2011   Procedure: LEFT AND RIGHT HEART CATHETERIZATION WITH CORONARY ANGIOGRAM;  Surgeon: Iran Ouch, MD;  Location: [facility] CATH LAB;  Service: Cardiovascular;  Laterality: N/A;   MITRAL VALVE REPAIR  10/17/2011   Procedure: MITRAL VALVE REPAIR (MVR);  Surgeon: Purcell Nails, MD;  Location: [facility] OR;  Service: Open Heart Surgery;  Laterality: N/A;   NASAL SINUS SURGERY  1990's   right     Family History  Problem Relation Age of Onset   Heart disease Mother    Diabetes Father    Cardiomyopathy Father    Colon cancer Neg Hx      Social History   Socioeconomic History   Marital status: Married    Spouse name: Not on file    Number of children: Not on file   Years of education: Not on file   Highest education level: Not on file  Occupational History   Occupation: Retired  Tobacco Use   Smoking status: Never   Smokeless tobacco: Never  Vaping Use   Vaping Use: Never used  Substance and Sexual Activity   Alcohol use: No    Alcohol/week: 0.0 standard drinks   Drug use: No   Sexual activity: Yes  Other Topics Concern   Not on file  Social History Narrative   Not on file   Social Determinants of Health  Financial Resource Strain: Not on file  Food Insecurity: Not on file  Transportation Needs: Not on file  Physical Activity: Not on file  Stress: Not on file  Social Connections: Not on file  Intimate Partner Violence: Not on file     Ht 6\' 4"  (1.93 m)   Wt 242 lb 12.8 oz (110.1 kg)   BMI 29.55 kg/m   Physical Exam:  Well appearing NAD HEENT: Unremarkable Neck:  No JVD, no thyromegally Lymphatics:  No adenopathy Back:  No CVA tenderness Lungs:  Clear HEART:  Regular rate rhythm, no murmurs, no rubs, no clicks Abd:  soft, positive bowel sounds, no organomegally, no rebound, no guarding Ext:  2 plus pulses, no edema, no cyanosis, no clubbing Skin:  No rashes no nodules Neuro:  CN II through XII intact, motor grossly intact  EKG  DEVICE  Normal device function.  See PaceArt for details.   Assess/Plan:  1. Chronic systolic heart failure - his symptoms are class 2. He will continue his current meds. 2. Persistent atrial fib - I think that he is stable and will allow him to stay in atrial fib as his rates are controlled. 3. ICD - his biotronik biv ICD is working normally. 4. CAD - he is s/p CABG. He will continue his current meds.   Carleene Overlie Amisha Pospisil,MD

## 2022-02-24 ENCOUNTER — Ambulatory Visit (INDEPENDENT_AMBULATORY_CARE_PROVIDER_SITE_OTHER): Payer: Medicare HMO

## 2022-02-24 DIAGNOSIS — I5022 Chronic systolic (congestive) heart failure: Secondary | ICD-10-CM

## 2022-02-24 DIAGNOSIS — I255 Ischemic cardiomyopathy: Secondary | ICD-10-CM

## 2022-02-24 LAB — CUP PACEART REMOTE DEVICE CHECK
Battery Voltage: 3.04 V
Brady Statistic RA Percent Paced: 0 %
Brady Statistic RV Percent Paced: 99 %
Date Time Interrogation Session: 20230628075058
HighPow Impedance: 66 Ohm
Implantable Lead Implant Date: 20130731
Implantable Lead Implant Date: 20220214
Implantable Lead Implant Date: 20220214
Implantable Lead Location: 753858
Implantable Lead Location: 753859
Implantable Lead Location: 753860
Implantable Lead Model: 365
Implantable Lead Model: 377
Implantable Lead Model: 3830
Implantable Lead Serial Number: 10505793
Implantable Lead Serial Number: 8000207354
Implantable Pulse Generator Implant Date: 20220214
Lead Channel Impedance Value: 522 Ohm
Lead Channel Impedance Value: 542 Ohm
Lead Channel Impedance Value: 620 Ohm
Lead Channel Pacing Threshold Amplitude: 0.6 V
Lead Channel Pacing Threshold Amplitude: 0.7 V
Lead Channel Pacing Threshold Pulse Width: 0.4 ms
Lead Channel Pacing Threshold Pulse Width: 0.4 ms
Lead Channel Sensing Intrinsic Amplitude: 3.6 mV
Lead Channel Setting Pacing Amplitude: 2.5 V
Lead Channel Setting Pacing Amplitude: 3 V
Lead Channel Setting Pacing Pulse Width: 0.4 ms
Lead Channel Setting Pacing Pulse Width: 0.4 ms
Lead Channel Setting Sensing Sensitivity: 0.8 mV
Lead Channel Setting Sensing Sensitivity: 1.6 mV
Pulse Gen Model: 429553
Pulse Gen Serial Number: 84824950

## 2022-03-10 NOTE — Progress Notes (Signed)
Remote ICD transmission.   

## 2022-03-30 ENCOUNTER — Telehealth: Payer: Self-pay | Admitting: *Deleted

## 2022-03-30 NOTE — Telephone Encounter (Signed)
   Pre-operative Risk Assessment    Patient Name: Wayne Gordon  DOB: 12-21-1933 MRN: [id number]      Request for Surgical Clearance    Procedure:   SPINAL CORD STIMULATOR TRIAL  Date of Surgery:  Clearance 05/06/22                                 Surgeon:  DR. O'TOOLE Surgeon's Group or Practice Name:  [facility] Phone number:  [phone] Fax number:  [phone]   Type of Clearance Requested:   - Pharmacy:  Hold Apixaban (Eliquis) 3 DAYS PRIOR TO PROCEDURE AND 3 DAYS DURING = 6 DAYS TOTAL   Type of Anesthesia:  Not Indicated   Additional requests/questions:    Wilhemina Cash   03/30/2022, 4:38 PM

## 2022-03-31 NOTE — Telephone Encounter (Signed)
Ok to hold eliquis up to 3 days before and after procedure as needed. GT

## 2022-03-31 NOTE — Telephone Encounter (Signed)
Patient with diagnosis of atrial fibrillation on Eliquis for anticoagulation.    Procedure: spinal cord stimulator trial Date of procedure: 05/06/22   CHA2DS2-VASc Score = 6   This indicates a 9.7% annual risk of stroke. The patient's score is based upon: CHF History: 1 HTN History: 1 Diabetes History: 1 Stroke History: 0 Vascular Disease History: 1 Age Score: 2 Gender Score: 0    CrCl 43 Platelet count 121  Per office protocol, patient can hold Eliquis for 3 days prior to procedure.   Patient will not need bridging with Lovenox (enoxaparin) around procedure.  Will send to Dr. Ladona Ridgel, as our protocol does not cover additional 3 days without medication while testing stimulator.  **This guidance is not considered finalized until pre-operative APP has relayed final recommendations.**

## 2022-04-01 NOTE — Telephone Encounter (Signed)
I have tried to reach the pt on all #'s available including DPR. No answer  Pt needs tele appt for pre op

## 2022-04-01 NOTE — Telephone Encounter (Signed)
   Name: Wayne Gordon  DOB: 08/16/1934  MRN: [id number]  Primary Cardiologist: Lewayne Bunting, MD   Preoperative team, please contact this patient and set up a phone call appointment for further preoperative risk assessment. Please obtain consent and complete medication review. Thank you for your help.  I confirm that guidance regarding antiplatelet and oral anticoagulation therapy has been completed and, if necessary, noted below.  Per Dr. Ladona Ridgel okay for patient to hold Eliquis 3 days before and 3 days after procedure.  See enclosed note.   Napoleon Form, Leodis Rains, NP 04/01/2022, 8:41 AM [facility] [address] [place], [place] [address]

## 2022-04-06 ENCOUNTER — Telehealth: Payer: Self-pay | Admitting: *Deleted

## 2022-04-06 NOTE — Telephone Encounter (Signed)
S/w the pt and he is agreeable to tele pre op appt 04/22/22 @ 2 pm. Med rec and consent are done. Pt also asked if I could help him to be able to get back into MY CHART. I have given the pt his user name and have reset his pw to [facility].   Pt thanked me for the help.     Patient Consent for Virtual Visit        Wayne Gordon has provided verbal consent on 04/06/2022 for a virtual visit (video or telephone).   CONSENT FOR VIRTUAL VISIT FOR:  Wayne Gordon  By participating in this virtual visit I agree to the following:  I hereby voluntarily request, consent and authorize [facility] and its employed or contracted physicians, Producer, television/film/video, nurse practitioners or other licensed health care professionals (the Practitioner), to provide me with telemedicine health care services (the "Services") as deemed necessary by the treating Practitioner. I acknowledge and consent to receive the Services by the Practitioner via telemedicine. I understand that the telemedicine visit will involve communicating with the Practitioner through live audiovisual communication technology and the disclosure of certain medical information by electronic transmission. I acknowledge that I have been given the opportunity to request an in-person assessment or other available alternative prior to the telemedicine visit and am voluntarily participating in the telemedicine visit.  I understand that I have the right to withhold or withdraw my consent to the use of telemedicine in the course of my care at any time, without affecting my right to future care or treatment, and that the Practitioner or I may terminate the telemedicine visit at any time. I understand that I have the right to inspect all information obtained and/or recorded in the course of the telemedicine visit and may receive copies of available information for a reasonable fee.  I understand that some of the potential risks of receiving the Services via  telemedicine include:  Delay or interruption in medical evaluation due to technological equipment failure or disruption; Information transmitted may not be sufficient (e.g. poor resolution of images) to allow for appropriate medical decision making by the Practitioner; and/or  In rare instances, security protocols could fail, causing a breach of personal health information.  Furthermore, I acknowledge that it is my responsibility to provide information about my medical history, conditions and care that is complete and accurate to the best of my ability. I acknowledge that Practitioner's advice, recommendations, and/or decision may be based on factors not within their control, such as incomplete or inaccurate data provided by me or distortions of diagnostic images or specimens that may result from electronic transmissions. I understand that the practice of medicine is not an exact science and that Practitioner makes no warranties or guarantees regarding treatment outcomes. I acknowledge that a copy of this consent can be made available to me via my patient portal [facility] MyChart), or I can request a printed copy by calling the office of [facility].    I understand that my insurance will be billed for this visit.   I have read or had this consent read to me. I understand the contents of this consent, which adequately explains the benefits and risks of the Services being provided via telemedicine.  I have been provided ample opportunity to ask questions regarding this consent and the Services and have had my questions answered to my satisfaction. I give my informed consent for the services to be provided through the use of telemedicine in  my medical care

## 2022-04-06 NOTE — Telephone Encounter (Signed)
S/w the pt and he is agreeable to tele pre op appt 04/22/22 @ 2 pm. Med rec and consent are done. Pt also asked if I could help him to be able to get back into MY CHART. I have given the pt his user name and have reset his pw to [facility].    Pt thanked me for the help.

## 2022-04-22 ENCOUNTER — Ambulatory Visit (INDEPENDENT_AMBULATORY_CARE_PROVIDER_SITE_OTHER): Payer: Medicare HMO | Admitting: Physician Assistant

## 2022-04-22 DIAGNOSIS — Z01810 Encounter for preprocedural cardiovascular examination: Secondary | ICD-10-CM

## 2022-04-22 NOTE — Progress Notes (Signed)
Virtual Visit via Telephone Note   Because of Wayne Gordon's co-morbid illnesses, he is at least at moderate risk for complications without adequate follow up.  This format is felt to be most appropriate for this patient at this time.  The patient did not have access to video technology/had technical difficulties with video requiring transitioning to audio format only (telephone).  All issues noted in this document were discussed and addressed.  No physical exam could be performed with this format.  Please refer to the patient's chart for his consent to telehealth for [facility].  Evaluation Performed:  Preoperative cardiovascular risk assessment _____________   Date:  04/22/2022   Patient ID:  Wayne Gordon, DOB 13-Jan-1934, MRN [id number] Patient Location:  Home Provider location:   Office  Primary Care Provider:  Rebekah Chesterfield, NP Primary Cardiologist:  Wayne Bunting, MD  Chief Complaint / Patient Profile   88 y.o. y/o male with a h/o persistent atrial fibrillation, chronic systolic heart failure, sinus node dysfunction (he underwent BiV ICD in 2022) who is pending spinal cord stimulator and presents today for telephonic preoperative cardiovascular risk assessment.  Past Medical History    Past Medical History:  Diagnosis Date   Allergy to ACE inhibitors    Angioedema many years ago; patient has tolerated Losartan (ARB) in the past - it was held during [facility] for GI bleed and worsening renal function >> resume Losartan 25 mg QD 06/2015   Arthritis    Atrial fibrillation (HCC)    PAF, CHADs2Vasc = 5   Carotid artery disease (HCC)    60-79% LICA   CHF (congestive heart failure) (HCC)    CKD (chronic kidney disease)    Coronary artery disease    Diabetes mellitus    GERD (gastroesophageal reflux disease)    Hyperlipidemia    Hypertension    ICD (implantable cardiac defibrillator) in place 03/28/2012   Biotronik, Dr. Ladona Ridgel 03/28/12   Ischemic cardiomyopathy     S/P CABG x 3; EF 25%   Mitral regurgitation    S/P mitral valve repair 2013   Myocardial infarction [facility])    Renal insufficiency    Past Surgical History:  Procedure Laterality Date   BIV UPGRADE N/A 10/12/2020   Procedure: BIV ICD UPGRADE;  Surgeon: Marinus Maw, MD;  Location: [facility] INVASIVE CV LAB;  Service: Cardiovascular;  Laterality: N/A;   CHOLECYSTECTOMY N/A 09/20/2017   Procedure: LAPAROSCOPIC SUBTOTAL CHOLECYSTECTOMY;  Surgeon: Axel Filler, MD;  Location: [facility] OR;  Service: General;  Laterality: N/A;   COLONOSCOPY N/A 04/24/2014   RMR Pancolonic diverticulosis   CORONARY ARTERY BYPASS GRAFT  10/17/2011   Procedure: CORONARY ARTERY BYPASS GRAFTING (CABG);  Surgeon: Purcell Nails, MD;  Location: [facility] OR;  Service: Open Heart Surgery;  Laterality: N/A;   ERCP N/A 09/29/2017   Procedure: ENDOSCOPIC RETROGRADE CHOLANGIOPANCREATOGRAPHY (ERCP);  Surgeon: Jeani Hawking, MD;  Location: [facility] ENDOSCOPY;  Service: Endoscopy;  Laterality: N/A;   ESOPHAGOGASTRODUODENOSCOPY N/A 04/24/2014   RMR Subtle nodularity the gastric mucosa of uncertain significance-status post biopsy. Hiatal hernia. chronic inflammation, no H.pylori   ESOPHAGOGASTRODUODENOSCOPY N/A 06/24/2016   Dr. Jena Gauss: normal esophagus, small hiatal hernia, normal duodenum   ICD  03/28/2012   IMPLANTABLE CARDIOVERTER DEFIBRILLATOR IMPLANT N/A 03/28/2012   Procedure: IMPLANTABLE CARDIOVERTER DEFIBRILLATOR IMPLANT;  Surgeon: Marinus Maw, MD;  Location: [facility] CATH LAB;  Service: Cardiovascular;  Laterality: N/A;   KNEE ARTHROSCOPY  ~ 2008   right   LEFT AND RIGHT HEART CATHETERIZATION  WITH CORONARY ANGIOGRAM N/A 10/12/2011   Procedure: LEFT AND RIGHT HEART CATHETERIZATION WITH CORONARY ANGIOGRAM;  Surgeon: Iran Ouch, MD;  Location: [facility] CATH LAB;  Service: Cardiovascular;  Laterality: N/A;   MITRAL VALVE REPAIR  10/17/2011   Procedure: MITRAL VALVE REPAIR (MVR);  Surgeon: Purcell Nails, MD;  Location: [facility] OR;  Service: Open Heart  Surgery;  Laterality: N/A;   NASAL SINUS SURGERY  1990's   right    Allergies  Allergies  Allergen Reactions   Ace Inhibitors Swelling and Other (See Comments)    Kidney issues; angioedema per PMH in chart    Penicillins Palpitations    Has patient had a PCN reaction causing immediate rash, facial/tongue/throat swelling, SOB or lightheadedness with hypotension:  Has patient had a PCN reaction causing severe rash involving mucus membranes or skin necrosis:  Has patient had a PCN reaction that required hospitalization  Has patient had a PCN reaction occurring within the last 10 years:  If all of the above answers are "NO", then may proceed with Cephalosporin use.    History of Present Illness    Wayne Gordon is a 88 y.o. male who presents via Web designer for a telehealth visit today.  Pt was last seen in cardiology clinic on 01/19/2022 by Dr. Lewayne Gordon.  At that time Wayne Gordon was doing well.  The patient is now pending procedure as outlined above. Since his last visit, he is doing pretty good.  The only issues he is having is with his back.  He has not had any shortness of breath or chest pain.  Blood pressure has been good.  Sometimes he uses a cane for mobility but not always.  He admits to probably needing to use it more often.  He states that he has no issue climbing stairs as long as he has a railing to hold onto.  He could probably help around the house but his wife takes care of most of the cleaning.  Because of this he is scored a 4.73 on the DASI.  This exceeds the 4 METS minimum requirement.  He states he used to golf but that was back in 2017.   Per Dr. Ladona Ridgel okay for patient to hold Eliquis 3 days before and 3 days after procedure.    Home Medications    Prior to Admission medications   Medication Sig Start Date End Date Taking? Authorizing Provider  acetaminophen (TYLENOL) 325 MG tablet Take 2 tablets (650 mg total) by mouth every 6 (six) hours as  needed for mild pain (or Fever >/= 101). 10/18/21   Drema Dallas, MD  albuterol (VENTOLIN HFA) 108 (90 Base) MCG/ACT inhaler Inhale 2 puffs into the lungs every 6 (six) hours as needed for wheezing or shortness of breath. 10/18/21   Drema Dallas, MD  CALCIUM-MAGNESIUM-ZINC PO Take 2 tablets by mouth daily.    [provider]  carvedilol (COREG) 12.5 MG tablet Take 1 tablet (12.5 mg total) by mouth 2 (two) times daily with a meal. 10/18/21   Drema Dallas, MD  Cholecalciferol (VITAMIN D3) 10 MCG (400 UNIT) CAPS Take 400 Units by mouth daily.    [provider]  Cyanocobalamin (VITAMIN B-12 IJ) Inject 1 mL as directed every 30 (thirty) days.     [provider]  dapagliflozin propanediol (FARXIGA) 10 MG TABS tablet Take 1 tablet (10 mg total) by mouth daily. 10/19/21   Drema Dallas, MD  dextromethorphan-guaiFENesin [facility] DM) 30-600  MG 12hr tablet Take 1 tablet by mouth 2 (two) times daily. 10/18/21   Drema Dallas, MD  ELIQUIS 2.5 MG TABS tablet TAKE (1) TABLET TWICE A DAY. 03/25/21   Marinus Maw, MD  FIASP FLEXTOUCH 100 UNIT/ML FlexTouch Pen Inject 6 Units into the skin every evening. 07/09/21   [provider]  furosemide (LASIX) 40 MG tablet Take 40 mg by mouth daily. 03/09/20   [provider]  gabapentin (NEURONTIN) 100 MG capsule Take 100 mg by mouth 3 (three) times daily.    [provider]  glipiZIDE (GLUCOTROL) 10 MG tablet Take 10 mg by mouth 2 (two) times daily. Patient not taking: Reported on 04/06/2022 06/10/21   [provider]  Insulin Glargine (TOUJEO SOLOSTAR) 300 UNIT/ML SOPN Inject 18 Units into the skin daily. 04/27/15   [provider]  Multiple Vitamins-Minerals (MULTIVITAMINS THER. W/MINERALS) TABS Take 1 tablet by mouth daily.    [provider]  NIACIN CR PO Take 400 mg by mouth daily.    [provider]  omeprazole (PRILOSEC) 40 MG capsule Take 1 capsule (40 mg total) by mouth  daily before breakfast. 02/27/20   Letta Median, PA-C  oxyCODONE (OXY IR/ROXICODONE) 5 MG immediate release tablet Take 1 tablet (5 mg total) by mouth every 4 (four) hours as needed for moderate pain. Patient not taking: Reported on 11/26/2021 10/18/21   Drema Dallas, MD  Riboflavin (VITAMIN B-2 PO) Take 1 tablet by mouth daily.    [provider]  rosuvastatin (CRESTOR) 10 MG tablet Take 1 tablet (10 mg total) by mouth every evening. Patient taking differently: Take 5 mg by mouth every evening. 10/18/21   Drema Dallas, MD  sodium chloride (OCEAN) 0.65 % SOLN nasal spray Place 1 spray into both nostrils as needed for congestion. 10/18/21   Drema Dallas, MD  traZODone (DESYREL) 50 MG tablet Take 25-50 mg by mouth at bedtime. 08/30/21   [provider]    Physical Exam    Vital Signs:  Wayne Gordon does not have vital signs available for review today.  Given telephonic nature of communication, physical exam is limited. AAOx3. NAD. Normal affect.  Speech and respirations are unlabored.  Accessory Clinical Findings    None  Assessment & Plan    1.  Preoperative Cardiovascular Risk Assessment:  Mr. Melnik's perioperative risk of a major cardiac event is 11% according to the Revised Cardiac Risk Index (RCRI).  Therefore, he is at high risk for perioperative complications.   His functional capacity is fair at 4.73 METs according to the [place] Activity Status Index (DASI). Recommendations: According to ACC/AHA guidelines, no further cardiovascular testing needed.  The patient may proceed to surgery at acceptable risk.   Antiplatelet and/or Anticoagulation Recommendations: Eliquis (Apixaban) can be held for 3 days prior to surgery.  Please resume post op when felt to be safe.     A copy of this note will be routed to requesting surgeon.  Time:   Today, I have spent 7 minutes with the patient with telehealth technology discussing medical history, symptoms, and  management plan.     Sharlene Dory, PA-C  04/22/2022, 1:33 PM

## 2022-05-05 ENCOUNTER — Telehealth: Payer: Self-pay | Admitting: Internal Medicine

## 2022-05-05 ENCOUNTER — Encounter: Payer: Self-pay | Admitting: Internal Medicine

## 2022-05-05 NOTE — Telephone Encounter (Signed)
Requesting provider called back today to request device instructions.   I'm not sure if you guys received this request previously, but procedure is tomorrow. I apologize if it was not brought to your awareness before now.  Thank you Levi Aland, NP-C     05/05/2022, 1:49 PM [address], [address] Office [phone] Fax [phone]

## 2022-05-05 NOTE — Telephone Encounter (Signed)
Olegario Messier from [facility] calling asking for device recommendations before surgery tomorrow and after. Please advise

## 2022-05-05 NOTE — Progress Notes (Signed)
PERIOPERATIVE PRESCRIPTION FOR IMPLANTED CARDIAC DEVICE PROGRAMMING   Patient Information:  Patient: Wayne Gordon  MRN: [id number]  Date of Birth: 12-29-1933      Planned Procedure:  SPINAL CORD STIMULATOR TRIAL  Surgeon:  Dr. Thalia Bloodgood Date of Procedure:  05/06/2022    Device Information:   Clinic EP Physician:   Dr. Lewayne Bunting Device Type:  Defibrillator Manufacturer and Phone #:  Biotronik: [phone] Pacemaker Dependent?:  Yes Date of Last Device Check:  02/23/2022        Normal Device Function?:  Yes     Electrophysiologist's Recommendations:   Have magnet available. Provide continuous ECG monitoring when magnet is used or reprogramming is to be performed.  Procedure will likely interfere with device function.  Device should be programmed:  Tachy therapies disabled  Per Device Clinic Standing Orders, Lenor Coffin  05/05/2022 3:42 PM

## 2022-05-05 NOTE — Telephone Encounter (Signed)
Sent clearance to device team

## 2022-05-09 ENCOUNTER — Telehealth: Payer: Self-pay | Admitting: *Deleted

## 2022-05-09 NOTE — Telephone Encounter (Signed)
   Pre-operative Risk Assessment    Patient Name: Wayne Gordon  DOB: [birth year] MRN: [id number]      Request for Surgical Clearance    Procedure:   PERMANENT SPINAL CORD STIMULATOR PLACEMENT  Date of Surgery:  Clearance TBD                                 Surgeon:  DR. O'TOOLE Surgeon's Group or Practice Name:  [facility] Phone number:  [phone] Fax number:  [phone] ATTN: Olegario Messier . CMA    Type of Clearance Requested:   - Medical  - Pharmacy:  Hold Apixaban (Eliquis) x 3 DAYS PRIOR    Type of Anesthesia:  Not Indicated   Additional requests/questions:    [name]   05/09/2022, 1:58 PM

## 2022-05-11 NOTE — Telephone Encounter (Signed)
Patient with diagnosis of atrial fibrillation on Eliquis for anticoagulation.    Procedure: permanent spinal cord stimulator placement Date of procedure: TBD   CHA2DS2-VASc Score = 6   This indicates a 9.7% annual risk of stroke. The patient's score is based upon: CHF History: 1 HTN History: 1 Diabetes History: 1 Stroke History: 0 Vascular Disease History: 1 Age Score: 2 Gender Score: 0  CrCl 37 Platelet count 213  Per office protocol, patient can hold Eliquis for 3 days prior to procedure.   Patient will not need bridging with Lovenox (enoxaparin) around procedure.  **This guidance is not considered finalized until pre-operative APP has relayed final recommendations.**

## 2022-05-12 NOTE — Telephone Encounter (Addendum)
   Patient Name: Wayne Gordon  DOB: 09-16-1933 MRN: [id number]  Primary Cardiologist: Lewayne Bunting, MD  Chart reviewed as part of pre-operative protocol coverage. Patient had recent clearance addressed in 04/22/22 note by Jari Favre PA-C for initial spinal cord stimulator placement, now pending permanent placement. Since this was assessed <2 months ago, the patient still qualifies for clearance. Per pharmD, "Per office protocol, patient can hold Eliquis for 3 days prior to procedure.  Patient will not need bridging with Lovenox (enoxaparin) around procedure." Will route this message and Tessa's note to requesting provider via Epic fax function. Will also cc device clinic so they are aware upcoming surgery, date TBD. Please call with questions.  Laurann Montana, PA-C 05/12/2022, 11:56 AM

## 2022-05-16 ENCOUNTER — Encounter: Payer: Self-pay | Admitting: Internal Medicine

## 2022-05-26 ENCOUNTER — Ambulatory Visit (INDEPENDENT_AMBULATORY_CARE_PROVIDER_SITE_OTHER): Payer: Medicare HMO

## 2022-05-26 DIAGNOSIS — I255 Ischemic cardiomyopathy: Secondary | ICD-10-CM

## 2022-05-26 LAB — CUP PACEART REMOTE DEVICE CHECK
Date Time Interrogation Session: 20230928121515
Implantable Lead Implant Date: 20130731
Implantable Lead Implant Date: 20220214
Implantable Lead Implant Date: 20220214
Implantable Lead Location: 753858
Implantable Lead Location: 753859
Implantable Lead Location: 753860
Implantable Lead Model: 365
Implantable Lead Model: 377
Implantable Lead Model: 3830
Implantable Lead Serial Number: 10505793
Implantable Lead Serial Number: 8000207354
Implantable Pulse Generator Implant Date: 20220214
Pulse Gen Model: 429553
Pulse Gen Serial Number: 84824950

## 2022-06-01 NOTE — Progress Notes (Signed)
Remote ICD transmission.   

## 2022-08-25 ENCOUNTER — Ambulatory Visit (INDEPENDENT_AMBULATORY_CARE_PROVIDER_SITE_OTHER): Payer: Medicare HMO

## 2022-08-25 DIAGNOSIS — I255 Ischemic cardiomyopathy: Secondary | ICD-10-CM

## 2022-08-25 LAB — CUP PACEART REMOTE DEVICE CHECK
Date Time Interrogation Session: 20231224080740
Implantable Lead Connection Status: 753985
Implantable Lead Connection Status: 753985
Implantable Lead Connection Status: 753985
Implantable Lead Implant Date: 20130731
Implantable Lead Implant Date: 20220214
Implantable Lead Implant Date: 20220214
Implantable Lead Location: 753858
Implantable Lead Location: 753859
Implantable Lead Location: 753860
Implantable Lead Model: 365
Implantable Lead Model: 377
Implantable Lead Model: 3830
Implantable Lead Serial Number: 10505793
Implantable Lead Serial Number: 8000207354
Implantable Pulse Generator Implant Date: 20220214
Pulse Gen Model: 429553
Pulse Gen Serial Number: 84824950

## 2022-09-12 NOTE — Progress Notes (Signed)
Remote ICD transmission.   

## 2022-11-03 ENCOUNTER — Telehealth: Payer: Self-pay | Admitting: Internal Medicine

## 2022-11-03 NOTE — Telephone Encounter (Signed)
Patient wants an application for assistance getting Eliquis mailed to him.

## 2022-11-03 NOTE — Telephone Encounter (Signed)
Will mail patient Eliquis patient assistance paperwork.

## 2022-11-18 ENCOUNTER — Other Ambulatory Visit: Payer: Self-pay | Admitting: Internal Medicine

## 2022-11-18 NOTE — Telephone Encounter (Signed)
Prescription refill request for Eliquis received. Indication: afib  Last office visit:Conte 04/22/2022 Scr: 2.15, 12/2021 Age: 89 yo  Weight: 110 kg

## 2022-11-24 ENCOUNTER — Ambulatory Visit (INDEPENDENT_AMBULATORY_CARE_PROVIDER_SITE_OTHER): Payer: Medicare HMO

## 2022-11-24 DIAGNOSIS — I255 Ischemic cardiomyopathy: Secondary | ICD-10-CM

## 2022-11-24 LAB — CUP PACEART REMOTE DEVICE CHECK
Date Time Interrogation Session: 20240328072715
Implantable Lead Connection Status: 753985
Implantable Lead Connection Status: 753985
Implantable Lead Connection Status: 753985
Implantable Lead Implant Date: 20130731
Implantable Lead Implant Date: 20220214
Implantable Lead Implant Date: 20220214
Implantable Lead Location: 753858
Implantable Lead Location: 753859
Implantable Lead Location: 753860
Implantable Lead Model: 365
Implantable Lead Model: 377
Implantable Lead Model: 3830
Implantable Lead Serial Number: 10505793
Implantable Lead Serial Number: 8000207354
Implantable Pulse Generator Implant Date: 20220214
Pulse Gen Model: 429553
Pulse Gen Serial Number: 84824950

## 2022-12-02 ENCOUNTER — Telehealth: Payer: Self-pay | Admitting: Internal Medicine

## 2022-12-02 NOTE — Telephone Encounter (Signed)
Pt c/o medication issue:  1. Name of Medication:  apixaban (ELIQUIS) 2.5 MG TABS tablet  2. How are you currently taking this medication (dosage and times per day)?   3. Are you having a reaction (difficulty breathing--STAT)?   4. What is your medication issue?  Patient states he recently dropped off patient assistance forms and he would like to know if there have been any updates.

## 2022-12-02 NOTE — Telephone Encounter (Signed)
Pt notified that application will need to be completed by the MD and his portion refaxed to [employer].

## 2022-12-09 ENCOUNTER — Telehealth: Payer: Self-pay | Admitting: *Deleted

## 2022-12-09 NOTE — Telephone Encounter (Signed)
Called [employer] patient assistance program ( Eliquis). Pt has been denied d/t not meeting the 3% out of pocket of $857.75. Pt made aware and states that when he gets to that he will let the office know.

## 2022-12-20 ENCOUNTER — Telehealth: Payer: Self-pay

## 2022-12-20 NOTE — Telephone Encounter (Signed)
Alert received from [employer] for CRT pacing interrupt.  Recording shows slow VT.

## 2022-12-20 NOTE — Telephone Encounter (Signed)
agree

## 2022-12-20 NOTE — Telephone Encounter (Signed)
Called patient to do Quick check on device.   Patient denies any recent illness,symptoms or missed doses of medications. Reports feeling well.   Quick Check processed and patient is in SR. Patient has upcoming apt with Dr. Ladona Ridgel in [place]. Advised if he has further questions or concerns to please let us know. Voiced understanding.   Dr. Ladona Ridgel, does patient need to come in sooner or okay to wait until apt on 02/09/23.

## 2022-12-26 ENCOUNTER — Telehealth: Payer: Self-pay | Admitting: Internal Medicine

## 2022-12-26 NOTE — Telephone Encounter (Signed)
Spoke with Darvelle at [facility] in regards to patients application. She states that the spouse's income page did not have the name on it. I rescaned and faxed to [employer]. Will ask pt for a clear page to fax.

## 2022-12-26 NOTE — Telephone Encounter (Signed)
innix, Wayne Pock, LPN      1/61/09  4:48 PM Note Called [employer] patient assistance program ( Eliquis). Pt has been denied d/t not meeting the 3% out of pocket of $857.75. Pt made aware and states that when he gets to that he will let the office know.

## 2022-12-26 NOTE — Telephone Encounter (Signed)
Pt brought another proof of income for spouse and it was scanned and faxed to [employer].

## 2022-12-26 NOTE — Telephone Encounter (Signed)
Pt c/o medication issue:  1. Name of Medication:   apixaban (ELIQUIS) 2.5 MG TABS tablet    2. How are you currently taking this medication (dosage and times per day)?   TAKE (1) TABLET TWICE A DAY.    3. Are you having a reaction (difficulty breathing--STAT)? No  4. What is your medication issue? Pt stated he received a letter from Patient Assistance stating they need proof of his income. Pt is requesting MD fax over that info due to it not being faxed over initially and stated if he needs to provide the office with it, he'll bring it to the office. Pt will be leaving home today at 9 am and back home by 12 noon, so he'd like to ca called while at home. Please advise.

## 2023-01-02 NOTE — Progress Notes (Signed)
Remote ICD transmission.   

## 2023-01-03 ENCOUNTER — Telehealth: Payer: Self-pay

## 2023-01-03 NOTE — Telephone Encounter (Addendum)
[employer] alert received for CRT pacing interrupt:   EGM shows Aflutter with RVR likely but want to rule out a dual tach with slow VT?  Recent hx of slow VT during pacing interrupt on 12/20/22.  Patient is asymptomatic with no missed doses of meds.  Updated transmission from today shows Aflutter with BIVP at 70bpm, with occasional PVC.   Has appointment with Dr. Ladona Ridgel on 02/09/23.  Patient does report that he had a back TENS stimulator put in 3-75months ago as a side note.   Forwarding to Dr. Ladona Ridgel for review.  Dr. Nelly Laurence DOD today, will also have him review.    TRANSMISSION WITH CRT INTERRUPT: 01/02/23 EVENT       CURRENT UPDATED EGM (MAY 7TH 848AM)        PRIOR ROUTINE EGM WITH SOME INTRINSIC R WAVES FOR COMPARISON:

## 2023-01-04 ENCOUNTER — Telehealth: Payer: Self-pay | Admitting: Internal Medicine

## 2023-01-04 NOTE — Telephone Encounter (Signed)
Reviewed with Dr. Ladona Ridgel and advises appears AF w/ RVR. Does not think Dual tach. No further orders at this time.

## 2023-01-04 NOTE — Telephone Encounter (Signed)
Attempted to call the patient, there was a busy signal.

## 2023-01-04 NOTE — Telephone Encounter (Signed)
Patient c/o Palpitations:  High priority if patient c/o lightheadedness, shortness of breath, or chest pain  How long have you had palpitations/irregular HR/ Afib? Are you having the symptoms now? Went into Afib around 1:00am, no symptoms right now but there was a headache and didn't get any sleep.   Are you currently experiencing lightheadedness, SOB or CP? No  Do you have a history of afib (atrial fibrillation) or irregular heart rhythm? "Not really, but issues with it in the last 3/4 months"  Have you checked your BP or HR? (document readings if available): No  Are you experiencing any other symptoms? No

## 2023-01-05 NOTE — Telephone Encounter (Signed)
EP Attending  I initially quickly reviewed the strips earlier today and thought that the patient had atrial flutter with a RVR. However, on further review, I cannot rule out double tachycardia. For now if no significant symptoms I would not recommend a change in his management based on other comorbidities. GT

## 2023-01-06 NOTE — Telephone Encounter (Signed)
Pt called back per message received from [facility] Triage, and follow up call per Device Team notes.    Dr. Ladona Ridgel reviewed the device report, as Pt has Bio ICD in place.  Pt stated he has been asymptomatic since speaking with device team.  Pt had felt some fluttering, but had no concerns at the time of my follow up call.    Pt advised he has a follow up appointment with Dr. Lewayne Bunting on 02/09/2023 at 900 am.  Pt advised to contact [facility] with worsening symptoms, and to make [place] aware of any changes.  Pt verbalized understanding, and no follow up required at this time.

## 2023-01-24 ENCOUNTER — Telehealth: Payer: Self-pay | Admitting: Internal Medicine

## 2023-01-24 NOTE — Telephone Encounter (Signed)
[phone]   Patient will call, he had a different number and was on hold for a long time

## 2023-01-24 NOTE — Telephone Encounter (Signed)
Pt c/o medication issue:  1. Name of Medication:   dapagliflozin propanediol (FARXIGA) 10 MG TABS tablet   2. How are you currently taking this medication (dosage and times per day)?  As prescribed  3. Are you having a reaction (difficulty breathing--STAT)?   4. What is your medication issue?   Patient stated he needs assistance getting in touch with the company to get this medication.  Patient stated he received a letter stating he was re-enrolled but he has still not received any medication.  Patient provided ID# [id number] and prescription# [id number].

## 2023-02-07 ENCOUNTER — Other Ambulatory Visit: Payer: Self-pay | Admitting: Internal Medicine

## 2023-02-07 NOTE — Telephone Encounter (Signed)
Prescription refill request for Eliquis received. Indication: AF Last office visit: 04/02/22  T Conte PA-C Scr: 2.0 on 12/27/22  [facility] Age: 89 Weight: 106.4kg  Based on above findings Eliquis 2.5mg  twice daily is the appropriate dose.  Refill approved.

## 2023-02-09 ENCOUNTER — Other Ambulatory Visit: Payer: Self-pay

## 2023-02-09 ENCOUNTER — Ambulatory Visit: Payer: Medicare HMO | Attending: Internal Medicine | Admitting: Internal Medicine

## 2023-02-09 ENCOUNTER — Encounter: Payer: Self-pay | Admitting: Internal Medicine

## 2023-02-09 VITALS — BP 134/70 | HR 70 | Ht 76.0 in | Wt 244.0 lb

## 2023-02-09 DIAGNOSIS — I442 Atrioventricular block, complete: Secondary | ICD-10-CM

## 2023-02-09 LAB — CUP PACEART INCLINIC DEVICE CHECK
Date Time Interrogation Session: 20240613123502
Implantable Lead Connection Status: 753985
Implantable Lead Connection Status: 753985
Implantable Lead Connection Status: 753985
Implantable Lead Implant Date: 20130731
Implantable Lead Implant Date: 20220214
Implantable Lead Implant Date: 20220214
Implantable Lead Location: 753858
Implantable Lead Location: 753859
Implantable Lead Location: 753860
Implantable Lead Model: 365
Implantable Lead Model: 377
Implantable Lead Model: 3830
Implantable Lead Serial Number: 10505793
Implantable Lead Serial Number: 8000207354
Implantable Pulse Generator Implant Date: 20220214
Pulse Gen Model: 429553
Pulse Gen Serial Number: 84824950

## 2023-02-09 NOTE — Progress Notes (Signed)
HPI Wayne Gordon returns for followup. He is a pleasant 89 yo man with a h/o persistent atrial fib, Chronic systolic heart failure, sinus node dysfunction, and underwent upgrade to a biv ICD over 2 years ago. He feels better. He has not had syncope. His activity had improved as has his back pain. He had a spinal stimulator placed. He denies chest pain.      Current Outpatient Medications  Medication Sig Dispense Refill   acetaminophen (TYLENOL) 325 MG tablet Take 2 tablets (650 mg total) by mouth every 6 (six) hours as needed for mild pain (or Fever >/= 101). 30 tablet 0   albuterol (VENTOLIN HFA) 108 (90 Base) MCG/ACT inhaler Inhale 2 puffs into the lungs every 6 (six) hours as needed for wheezing or shortness of breath. 8 g 2   apixaban (ELIQUIS) 2.5 MG TABS tablet TAKE (1) TABLET TWICE A DAY. 180 tablet 1   CALCIUM-MAGNESIUM-ZINC PO Take 2 tablets by mouth daily.     carvedilol (COREG) 12.5 MG tablet Take 1 tablet (12.5 mg total) by mouth 2 (two) times daily with a meal. 60 tablet 0   Cholecalciferol (VITAMIN D3) 10 MCG (400 UNIT) CAPS Take 400 Units by mouth daily.     Cyanocobalamin (VITAMIN B-12 IJ) Inject 1 mL as directed every 30 (thirty) days.      dapagliflozin propanediol (FARXIGA) 10 MG TABS tablet Take 1 tablet (10 mg total) by mouth daily. 30 tablet 0   dextromethorphan-guaiFENesin (MUCINEX DM) 30-600 MG 12hr tablet Take 1 tablet by mouth 2 (two) times daily. 30 tablet 0   FIASP FLEXTOUCH 100 UNIT/ML FlexTouch Pen Inject 6 Units into the skin every evening.     furosemide (LASIX) 40 MG tablet Take 40 mg by mouth daily.     gabapentin (NEURONTIN) 100 MG capsule Take 100 mg by mouth 3 (three) times daily.     Insulin Glargine (TOUJEO SOLOSTAR) 300 UNIT/ML SOPN Inject 18 Units into the skin daily.     Multiple Vitamins-Minerals (MULTIVITAMINS THER. W/MINERALS) TABS Take 1 tablet by mouth daily.     NIACIN CR PO Take 400 mg by mouth daily.     omeprazole (PRILOSEC) 40 MG  capsule Take 1 capsule (40 mg total) by mouth daily before breakfast. 90 capsule 1   oxyCODONE (OXY IR/ROXICODONE) 5 MG immediate release tablet Take 1 tablet (5 mg total) by mouth every 4 (four) hours as needed for moderate pain. 10 tablet 0   Riboflavin (VITAMIN B-2 PO) Take 1 tablet by mouth daily.     rosuvastatin (CRESTOR) 10 MG tablet Take 1 tablet (10 mg total) by mouth every evening. (Patient taking differently: Take 5 mg by mouth every evening.) 60 tablet 0   sodium chloride (OCEAN) 0.65 % SOLN nasal spray Place 1 spray into both nostrils as needed for congestion. 15 mL 0   traZODone (DESYREL) 50 MG tablet Take 25-50 mg by mouth at bedtime.     No current facility-administered medications for this visit.     Past Medical History:  Diagnosis Date   Allergy to ACE inhibitors    Angioedema many years ago; patient has tolerated Losartan (ARB) in the past - it was held during [facility] for GI bleed and worsening renal function >> resume Losartan 25 mg QD 06/2015   Arthritis    Atrial fibrillation (HCC)    PAF, CHADs2Vasc = 5   Carotid artery disease (HCC)    60-79% LICA   CHF (congestive heart  failure) (HCC)    CKD (chronic kidney disease)    Coronary artery disease    Diabetes mellitus    GERD (gastroesophageal reflux disease)    Hyperlipidemia    Hypertension    ICD (implantable cardiac defibrillator) in place 03/28/2012   Biotronik, Dr. Ladona Ridgel 03/28/12   Ischemic cardiomyopathy    S/P CABG x 3; EF 25%   Mitral regurgitation    S/P mitral valve repair 2013   Myocardial infarction [facility])    Renal insufficiency     ROS:   All systems reviewed and negative except as noted in the HPI.   Past Surgical History:  Procedure Laterality Date   BIV UPGRADE N/A 10/12/2020   Procedure: BIV ICD UPGRADE;  Surgeon: Marinus Maw, MD;  Location: [facility] INVASIVE CV LAB;  Service: Cardiovascular;  Laterality: N/A;   CHOLECYSTECTOMY N/A 09/20/2017   Procedure: LAPAROSCOPIC SUBTOTAL  CHOLECYSTECTOMY;  Surgeon: Axel Filler, MD;  Location: [facility] OR;  Service: General;  Laterality: N/A;   COLONOSCOPY N/A 04/24/2014   RMR Pancolonic diverticulosis   CORONARY ARTERY BYPASS GRAFT  10/17/2011   Procedure: CORONARY ARTERY BYPASS GRAFTING (CABG);  Surgeon: Purcell Nails, MD;  Location: [facility] OR;  Service: Open Heart Surgery;  Laterality: N/A;   ERCP N/A 09/29/2017   Procedure: ENDOSCOPIC RETROGRADE CHOLANGIOPANCREATOGRAPHY (ERCP);  Surgeon: Jeani Hawking, MD;  Location: [facility] ENDOSCOPY;  Service: Endoscopy;  Laterality: N/A;   ESOPHAGOGASTRODUODENOSCOPY N/A 04/24/2014   RMR Subtle nodularity the gastric mucosa of uncertain significance-status post biopsy. Hiatal hernia. chronic inflammation, no H.pylori   ESOPHAGOGASTRODUODENOSCOPY N/A 06/24/2016   Dr. Jena Gauss: normal esophagus, small hiatal hernia, normal duodenum   ICD  03/28/2012   IMPLANTABLE CARDIOVERTER DEFIBRILLATOR IMPLANT N/A 03/28/2012   Procedure: IMPLANTABLE CARDIOVERTER DEFIBRILLATOR IMPLANT;  Surgeon: Marinus Maw, MD;  Location: [facility] CATH LAB;  Service: Cardiovascular;  Laterality: N/A;   KNEE ARTHROSCOPY  ~ 2008   right   LEFT AND RIGHT HEART CATHETERIZATION WITH CORONARY ANGIOGRAM N/A 10/12/2011   Procedure: LEFT AND RIGHT HEART CATHETERIZATION WITH CORONARY ANGIOGRAM;  Surgeon: Iran Ouch, MD;  Location: [facility] CATH LAB;  Service: Cardiovascular;  Laterality: N/A;   MITRAL VALVE REPAIR  10/17/2011   Procedure: MITRAL VALVE REPAIR (MVR);  Surgeon: Purcell Nails, MD;  Location: [facility] OR;  Service: Open Heart Surgery;  Laterality: N/A;   NASAL SINUS SURGERY  1990's   right     Family History  Problem Relation Age of Onset   Heart disease Mother    Diabetes Father    Cardiomyopathy Father    Colon cancer Neg Hx      Social History   Socioeconomic History   Marital status: Married    Spouse name: Not on file   Number of children: Not on file   Years of education: Not on file   Highest education level: Not on file   Occupational History   Occupation: Retired  Tobacco Use   Smoking status: Never   Smokeless tobacco: Never  Vaping Use   Vaping Use: Never used  Substance and Sexual Activity   Alcohol use: No    Alcohol/week: 0.0 standard drinks of alcohol   Drug use: No   Sexual activity: Yes  Other Topics Concern   Not on file  Social History Narrative   Not on file   Social Determinants of Health   Financial Resource Strain: Not on file  Food Insecurity: Not on file  Transportation Needs: Not on file  Physical Activity: Not on  file  Stress: Not on file  Social Connections: Not on file  Intimate Partner Violence: Not on file     Ht 6\' 4"  (1.93 m)   Wt 244 lb (110.7 kg)   BMI 29.70 kg/m   Physical Exam:  Well appearing NAD HEENT: Unremarkable Neck:  No JVD, no thyromegally Lymphatics:  No adenopathy Back:  No CVA tenderness Lungs:  Clear HEART:  Regular rate rhythm, no murmurs, no rubs, no clicks Abd:  soft, positive bowel sounds, no organomegally, no rebound, no guarding Ext:  2 plus pulses, no edema, no cyanosis, no clubbing Skin:  No rashes no nodules Neuro:  CN II through XII intact, motor grossly intact  EKG - atypical atrial flutter  DEVICE  Normal device function.  See PaceArt for details.   Assess/Plan: 1. Chronic systolic heart failure - his symptoms are class 2. He will continue his current meds. 2. Persistent atrial fib - I think that he is stable and will allow him to stay in atrial fib as his rates are controlled. 3. ICD - his biotronik biv ICD is working normally. 4. CAD - he is s/p CABG. He will continue his current meds.   Sharlot Gowda Ethlyn Alto,MD

## 2023-02-09 NOTE — Patient Instructions (Signed)
Medication Instructions:  Your physician recommends that you continue on your current medications as directed. Please refer to the Current Medication list given to you today.   Labwork: None today  Testing/Procedures: None today  Follow-Up: 1 year  Any Other Special Instructions Will Be Listed Below (If Applicable).  If you need a refill on your cardiac medications before your next appointment, please call your pharmacy.  

## 2023-02-23 ENCOUNTER — Ambulatory Visit (INDEPENDENT_AMBULATORY_CARE_PROVIDER_SITE_OTHER): Payer: Medicare HMO

## 2023-02-23 DIAGNOSIS — I442 Atrioventricular block, complete: Secondary | ICD-10-CM

## 2023-02-28 ENCOUNTER — Ambulatory Visit: Payer: Medicare HMO | Attending: Physician Assistant

## 2023-02-28 ENCOUNTER — Other Ambulatory Visit: Payer: Self-pay

## 2023-02-28 DIAGNOSIS — R2681 Unsteadiness on feet: Secondary | ICD-10-CM

## 2023-02-28 DIAGNOSIS — M6281 Muscle weakness (generalized): Secondary | ICD-10-CM

## 2023-02-28 LAB — CUP PACEART REMOTE DEVICE CHECK
Date Time Interrogation Session: 20240628131223
Implantable Lead Connection Status: 753985
Implantable Lead Connection Status: 753985
Implantable Lead Connection Status: 753985
Implantable Lead Implant Date: 20130731
Implantable Lead Implant Date: 20220214
Implantable Lead Implant Date: 20220214
Implantable Lead Location: 753858
Implantable Lead Location: 753859
Implantable Lead Location: 753860
Implantable Lead Model: 365
Implantable Lead Model: 377
Implantable Lead Model: 3830
Implantable Lead Serial Number: 10505793
Implantable Lead Serial Number: 8000207354
Implantable Pulse Generator Implant Date: 20220214
Pulse Gen Model: 429553
Pulse Gen Serial Number: 84824950

## 2023-02-28 NOTE — Therapy (Signed)
OUTPATIENT PHYSICAL THERAPY THORACOLUMBAR EVALUATION   Patient Name: Wayne Gordon MRN: [id number] DOB:11-18-1933, 89 y.o., male Today's Date: 02/28/2023  END OF SESSION:  PT End of Session - 02/28/23 1345     Visit Number 1    Number of Visits 12    Date for PT Re-Evaluation 05/26/23    PT Start Time 1348    PT Stop Time 1417    PT Time Calculation (min) 29 min    Activity Tolerance Patient tolerated treatment well    Behavior During Therapy [facility] for tasks assessed/performed             Past Medical History:  Diagnosis Date   Allergy to ACE inhibitors    Angioedema many years ago; patient has tolerated Losartan (ARB) in the past - it was held during [facility] for GI bleed and worsening renal function >> resume Losartan 25 mg QD 06/2015   Arthritis    Atrial fibrillation (HCC)    PAF, CHADs2Vasc = 5   Carotid artery disease (HCC)    60-79% LICA   CHF (congestive heart failure) (HCC)    CKD (chronic kidney disease)    Coronary artery disease    Diabetes mellitus    GERD (gastroesophageal reflux disease)    Hyperlipidemia    Hypertension    ICD (implantable cardiac defibrillator) in place 03/28/2012   Biotronik, Dr. Ladona Ridgel 03/28/12   Ischemic cardiomyopathy    S/P CABG x 3; EF 25%   Mitral regurgitation    S/P mitral valve repair 2013   Myocardial infarction [facility])    Renal insufficiency    Past Surgical History:  Procedure Laterality Date   BIV UPGRADE N/A 10/12/2020   Procedure: BIV ICD UPGRADE;  Surgeon: Marinus Maw, MD;  Location: [facility] INVASIVE CV LAB;  Service: Cardiovascular;  Laterality: N/A;   CHOLECYSTECTOMY N/A 09/20/2017   Procedure: LAPAROSCOPIC SUBTOTAL CHOLECYSTECTOMY;  Surgeon: Axel Filler, MD;  Location: [facility] OR;  Service: General;  Laterality: N/A;   COLONOSCOPY N/A 04/24/2014   RMR Pancolonic diverticulosis   CORONARY ARTERY BYPASS GRAFT  10/17/2011   Procedure: CORONARY ARTERY BYPASS GRAFTING (CABG);  Surgeon: Purcell Nails, MD;  Location: [facility]  OR;  Service: Open Heart Surgery;  Laterality: N/A;   ERCP N/A 09/29/2017   Procedure: ENDOSCOPIC RETROGRADE CHOLANGIOPANCREATOGRAPHY (ERCP);  Surgeon: Jeani Hawking, MD;  Location: [facility] ENDOSCOPY;  Service: Endoscopy;  Laterality: N/A;   ESOPHAGOGASTRODUODENOSCOPY N/A 04/24/2014   RMR Subtle nodularity the gastric mucosa of uncertain significance-status post biopsy. Hiatal hernia. chronic inflammation, no H.pylori   ESOPHAGOGASTRODUODENOSCOPY N/A 06/24/2016   Dr. Jena Gauss: normal esophagus, small hiatal hernia, normal duodenum   ICD  03/28/2012   IMPLANTABLE CARDIOVERTER DEFIBRILLATOR IMPLANT N/A 03/28/2012   Procedure: IMPLANTABLE CARDIOVERTER DEFIBRILLATOR IMPLANT;  Surgeon: Marinus Maw, MD;  Location: [facility] CATH LAB;  Service: Cardiovascular;  Laterality: N/A;   KNEE ARTHROSCOPY  ~ 2008   right   LEFT AND RIGHT HEART CATHETERIZATION WITH CORONARY ANGIOGRAM N/A 10/12/2011   Procedure: LEFT AND RIGHT HEART CATHETERIZATION WITH CORONARY ANGIOGRAM;  Surgeon: Iran Ouch, MD;  Location: [facility] CATH LAB;  Service: Cardiovascular;  Laterality: N/A;   MITRAL VALVE REPAIR  10/17/2011   Procedure: MITRAL VALVE REPAIR (MVR);  Surgeon: Purcell Nails, MD;  Location: [facility] OR;  Service: Open Heart Surgery;  Laterality: N/A;   NASAL SINUS SURGERY  1990's   right   Patient Active Problem List   Diagnosis Date Noted   Human metapneumovirus pneumonia 10/16/2021  Acute on chronic systolic CHF (congestive heart failure) (HCC) 10/16/2021   CHF exacerbation (HCC) 10/15/2021   CAD (coronary artery disease) of artery bypass graft 10/15/2021   History of implantable cardioverter-defibrillator (ICD) placement 10/15/2021   Benign essential HTN 10/15/2021   Chronic kidney disease, stage 3b (HCC) 10/15/2021   Acute respiratory failure with hypoxia (HCC) 10/14/2021   Bloating 02/27/2020   Nausea with vomiting 02/27/2020   Abdominal pain 02/27/2020   Iron deficiency anemia 02/08/2018   Stage III chronic kidney disease  (HCC) 02/08/2018   Diabetes mellitus (HCC) 02/08/2018   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   Bacteremia due to Klebsiella pneumoniae 09/22/2017   Sepsis due to Klebsiella pneumoniae (HCC) 09/22/2017   Cholangitis 09/17/2017   Leukocytosis 09/17/2017   Cholelithiasis 09/17/2017   Elevated liver enzymes 09/17/2017   Acute renal failure superimposed on stage 3 chronic kidney disease (HCC) 09/17/2017   Low blood pressure reading 09/17/2017   Lactic acidosis 09/17/2017   Elevated troponin 09/17/2017   Nausea without vomiting 01/10/2017   Gastrointestinal hemorrhage associated with intestinal diverticulosis 06/23/2016   Carotid artery disease (HCC)    Syncope and collapse 07/11/2015   Ventricular arrhythmia 07/10/2015   Complete heart block (HCC)    ICD (implantable cardioverter-defibrillator) discharge    Atrial fibrillation (HCC) 06/04/2015   Diabetes (HCC) 06/02/2015   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/02/2015   GERD (gastroesophageal reflux disease) 02/18/2015   Abdominal pain, epigastric 02/18/2015   Pleural effusion 09/01/2014   Rectal bleeding 03/25/2014   Acute blood loss anemia 03/25/2014   Adult body mass index 28.0-28.9 09/30/2013   Diabetes mellitus, type 2 (HCC) 08/09/2013   Type 2 diabetes mellitus without complication (HCC) 08/09/2013   PVC's (premature ventricular contractions) 07/04/2013   Hyperlipidemia 01/03/2013   Automatic implantable cardioverter-defibrillator in situ 03/30/2012   S/P MVR (mitral valve repair) 01/16/2012   HTN (hypertension) 11/28/2011   Chronic systolic heart failure (HCC) 10/31/2011   Ischemic cardiomyopathy 10/31/2011   CKD (chronic kidney disease) stage 3, GFR 30-59 ml/min (HCC) 10/31/2011   Current use of long term anticoagulation 10/31/2011   Long term (current) use of anticoagulants 10/28/2011   S/P mitral valve repair 10/17/2011   S/P CABG x 3 10/17/2011   Mitral regurgitation 10/12/2011   NSTEMI (non-ST elevated myocardial  infarction) (HCC) 10/11/2011    PCP: Rebekah Chesterfield, NP  REFERRING PROVIDER: Harle Battiest, PA-C   REFERRING DIAG: Status post insertion of spinal cord stimulator; Spinal stenosis, lumbar region with neurogenic claudication   Rationale for Evaluation and Treatment: Rehabilitation  THERAPY DIAG:  Muscle weakness (generalized)  Unsteadiness on feet  ONSET DATE: 1964  SUBJECTIVE:  SUBJECTIVE STATEMENT: Patient reports that his back has been bothering him since 1964, but he got a stimulator for his pain put in on 08/05/22. He has noticed that this has helped, but he still gets some pain after walking 20-30 yards. He can turn up his stimulator to help with this pain, but it makes his leg weak.   PERTINENT HISTORY:  History of a MI, hypertension, atrial fibrillation, diabetes, CKD, pacemaker, arthritis, and chronic left shoulder pain  PAIN:  Are you having pain? Yes: NPRS scale: 10/10 Pain location: low back  Pain description: "grabbing"  Aggravating factors: walking Relieving factors: turning up his stimulator  PRECAUTIONS: ICD/Pacemaker  WEIGHT BEARING RESTRICTIONS: No  FALLS:  Has patient fallen in last 6 months? No  LIVING ENVIRONMENT: Lives with: lives with their spouse Lives in: House/apartment Stairs: No Has following equipment at home: Single point cane and Ramped entry  OCCUPATION: retired  PLOF: Independent  PATIENT GOALS: improved strength and balance  NEXT MD VISIT: 05/11/23  OBJECTIVE:   SCREENING FOR RED FLAGS: Bowel or bladder incontinence: No Spinal tumors: No Cauda equina syndrome: No Compression fracture: No Abdominal aneurysm: No  COGNITION: Overall cognitive status: Within functional limits for tasks assessed     SENSATION: Light touch: Impaired   and diminished sensation at right lateral knee with no dermatomal pattern. Patient reports that he has neuropathy in both feet  POSTURE: forward head, decreased lumbar lordosis, and increased thoracic kyphosis  LOWER EXTREMITY ROM: WFL for activities assessed  LOWER EXTREMITY MMT:    MMT Right eval Left eval  Hip flexion 3+/5 3+/5  Hip extension    Hip abduction    Hip adduction    Hip internal rotation    Hip external rotation    Knee flexion 3+/5 4-/5  Knee extension 4-/5 3+/5; knee pain  Ankle dorsiflexion 3+/5 3+/5  Ankle plantarflexion    Ankle inversion    Ankle eversion     (Blank rows = not tested)  FUNCTIONAL TESTS:  5 times sit to stand: 36.99 seconds with UE support from armrests Timed up and go (TUG): 18.28 seconds with SPC   BALANCE:  Narrow BOS, firm surface, eyes open: 30 seconds  Narrow BOS, firm surface, eyes closed: 3 seconds  Semi-tandem, firm surface, eyes open, left leading: 25 seconds  Semi-tandem, firm surface, eyes open, right leading: 30 seconds  GAIT: Assistive device utilized: Single point cane Level of assistance: Modified independence Comments: decreased stride length and gait speed with poor foot clearance bilaterally  TODAY'S TREATMENT:                                                                                                                              DATE:     PATIENT EDUCATION:  Education details: POC, objective findings, prognosis, and goals for therapy Person educated: Patient Education method: Explanation Education comprehension: verbalized understanding  HOME EXERCISE PROGRAM:   ASSESSMENT:  CLINICAL IMPRESSION: Patient is a  89 y.o. male who was seen today for physical therapy evaluation and treatment for chronic low back pain with lower extremity weakness and instability.  He is at a high fall risk as evidenced by his gait mechanics and objective measures.  Recommend that he continue with skilled physical therapy  to address his impairments to maximize his safety and functional mobility.  OBJECTIVE IMPAIRMENTS: Abnormal gait, decreased activity tolerance, decreased balance, decreased mobility, difficulty walking, decreased strength, impaired sensation, postural dysfunction, and pain.   ACTIVITY LIMITATIONS: lifting, standing, stairs, transfers, and locomotion level  PARTICIPATION LIMITATIONS: shopping and community activity  PERSONAL FACTORS: Age, Time since onset of injury/illness/exacerbation, and 3+ comorbidities: History of a MI, hypertension, atrial fibrillation, diabetes, CKD, pacemaker, arthritis, and chronic left shoulder pain  are also affecting patient's functional outcome.   REHAB POTENTIAL: Fair    CLINICAL DECISION MAKING: Evolving/moderate complexity  EVALUATION COMPLEXITY: Moderate   GOALS: Goals reviewed with patient? Yes  SHORT TERM GOALS: Target date: 03/21/23  Patient will be independent with his initial HEP. Baseline: Goal status: INITIAL  2.  Patient will improve his 5 times sit to stand time to 30 seconds or less for improved lower extremity power. Baseline:  Goal status: INITIAL  3.  Patient will improve his timed up and go to 15 seconds or less for improved mobility. Baseline:  Goal status: INITIAL  LONG TERM GOALS: Target date: 04/11/23  Patient will be independent with his advanced HEP. Baseline:  Goal status: INITIAL  2.  Patient will improve his 5 times sit to stand time to 20 seconds or less for improved lower extremity power. Baseline:  Goal status: INITIAL  3.  Patient will be able to transfer from sitting to standing without upper extremity support for improved independence. Baseline:  Goal status: INITIAL  4.  Patient will improve his timed up and go to 12 seconds or less for reduced fall risk. Baseline:  Goal status: INITIAL  PLAN:  PT FREQUENCY: 2x/week  PT DURATION: 6 weeks  PLANNED INTERVENTIONS: Therapeutic exercises, Therapeutic  activity, Neuromuscular re-education, Balance training, Gait training, Patient/Family education, Self Care, Joint mobilization, Spinal mobilization, Cryotherapy, Moist heat, Manual therapy, and Re-evaluation.  PLAN FOR NEXT SESSION: nustep, lower extremity strengthening, and balance interventions   Granville Lewis, PT 02/28/2023, 5:56 PM

## 2023-03-07 ENCOUNTER — Ambulatory Visit: Payer: Medicare HMO | Admitting: *Deleted

## 2023-03-07 DIAGNOSIS — M6281 Muscle weakness (generalized): Secondary | ICD-10-CM | POA: Diagnosis not present

## 2023-03-07 NOTE — Therapy (Signed)
OUTPATIENT PHYSICAL THERAPY THORACOLUMBAR TREATMENT   Patient Name: Wayne Gordon MRN: [id number] DOB:1934-03-31, 89 y.o., male Today's Date: 03/07/2023  END OF SESSION:  PT End of Session - 03/07/23 1403     Visit Number 2    Number of Visits 12    Date for PT Re-Evaluation 05/26/23    PT Start Time 1345    PT Stop Time 1430    PT Time Calculation (min) 45 min             Past Medical History:  Diagnosis Date   Allergy to ACE inhibitors    Angioedema many years ago; patient has tolerated Losartan (ARB) in the past - it was held during [facility] for GI bleed and worsening renal function >> resume Losartan 25 mg QD 06/2015   Arthritis    Atrial fibrillation (HCC)    PAF, CHADs2Vasc = 5   Carotid artery disease (HCC)    60-79% LICA   CHF (congestive heart failure) (HCC)    CKD (chronic kidney disease)    Coronary artery disease    Diabetes mellitus    GERD (gastroesophageal reflux disease)    Hyperlipidemia    Hypertension    ICD (implantable cardiac defibrillator) in place 03/28/2012   [place]ron[place], Dr. Ladona Ridgel 03/28/12   Ischemic cardiomyopathy    S/P CABG x 3; EF 25%   Mitral regurgitation    S/P mitral valve repair 2013   Myocardial infarction [facility])    Renal insufficiency    Past Surgical History:  Procedure Laterality Date   BIV UPGRADE N/A 10/12/2020   Procedure: BIV ICD UPGRADE;  Surgeon: Marinus Maw, MD;  Location: [facility] INVASIVE CV LAB;  Service: Cardiovascular;  Laterality: N/A;   CHOLECYSTECTOMY N/A 09/20/2017   Procedure: LAPAROSCOPIC SUBTOTAL CHOLECYSTECTOMY;  Surgeon: Axel Filler, MD;  Location: [facility] OR;  Service: General;  Laterality: N/A;   COLONOSCOPY N/A 04/24/2014   RMR Pancolonic diverticulosis   CORONARY ARTERY BYPASS GRAFT  10/17/2011   Procedure: CORONARY ARTERY BYPASS GRAFTING (CABG);  Surgeon: Purcell Nails, MD;  Location: [facility] OR;  Service: Open Heart Surgery;  Laterality: N/A;   ERCP N/A 09/29/2017   Procedure: ENDOSCOPIC RETROGRADE  CHOLANGIOPANCREATOGRAPHY (ERCP);  Surgeon: Jeani Hawking, MD;  Location: [facility] ENDOSCOPY;  Service: Endoscopy;  Laterality: N/A;   ESOPHAGOGASTRODUODENOSCOPY N/A 04/24/2014   RMR Subtle nodularity the gastric mucosa of uncertain significance-status post biopsy. Hiatal hernia. chronic inflammation, no H.pylori   ESOPHAGOGASTRODUODENOSCOPY N/A 06/24/2016   Dr. Jena Gauss: normal esophagus, small hiatal hernia, normal duodenum   ICD  03/28/2012   IMPLANTABLE CARDIOVERTER DEFIBRILLATOR IMPLANT N/A 03/28/2012   Procedure: IMPLANTABLE CARDIOVERTER DEFIBRILLATOR IMPLANT;  Surgeon: Marinus Maw, MD;  Location: [facility] CATH LAB;  Service: Cardiovascular;  Laterality: N/A;   KNEE ARTHROSCOPY  ~ 2008   right   LEFT AND RIGHT HEART CATHETERIZATION WITH CORONARY ANGIOGRAM N/A 10/12/2011   Procedure: LEFT AND RIGHT HEART CATHETERIZATION WITH CORONARY ANGIOGRAM;  Surgeon: Iran Ouch, MD;  Location: [facility] CATH LAB;  Service: Cardiovascular;  Laterality: N/A;   MITRAL VALVE REPAIR  10/17/2011   Procedure: MITRAL VALVE REPAIR (MVR);  Surgeon: Purcell Nails, MD;  Location: [facility] OR;  Service: Open Heart Surgery;  Laterality: N/A;   NASAL SINUS SURGERY  1990's   right   Patient Active Problem List   Diagnosis Date Noted   Human metapneumovirus pneumonia 10/16/2021   Acute on chronic systolic CHF (congestive heart failure) (HCC) 10/16/2021   CHF exacerbation (HCC) 10/15/2021   CAD (  coronary artery disease) of artery bypass graft 10/15/2021   History of implantable cardioverter-defibrillator (ICD) placement 10/15/2021   Benign essential HTN 10/15/2021   Chronic kidney disease, stage 3b (HCC) 10/15/2021   Acute respiratory failure with hypoxia (HCC) 10/14/2021   Bloating 02/27/2020   Nausea with vomiting 02/27/2020   Abdominal pain 02/27/2020   Iron deficiency anemia 02/08/2018   Stage III chronic kidney disease (HCC) 02/08/2018   Diabetes mellitus (HCC) 02/08/2018   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   Bacteremia  due to Klebsiella pneumoniae 09/22/2017   Sepsis due to Klebsiella pneumoniae (HCC) 09/22/2017   Cholangitis 09/17/2017   Leukocytosis 09/17/2017   Cholelithiasis 09/17/2017   Elevated liver enzymes 09/17/2017   Acute renal failure superimposed on stage 3 chronic kidney disease (HCC) 09/17/2017   Low blood pressure reading 09/17/2017   Lactic acidosis 09/17/2017   Elevated troponin 09/17/2017   Nausea without vomiting 01/10/2017   Gastrointestinal hemorrhage associated with intestinal diverticulosis 06/23/2016   Carotid artery disease (HCC)    Syncope and collapse 07/11/2015   Ventricular arrhythmia 07/10/2015   Complete heart block (HCC)    ICD (implantable cardioverter-defibrillator) discharge    Atrial fibrillation (HCC) 06/04/2015   Diabetes (HCC) 06/02/2015   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/02/2015   GERD (gastroesophageal reflux disease) 02/18/2015   Abdominal pain, epigastric 02/18/2015   Pleural effusion 09/01/2014   Rectal bleeding 03/25/2014   Acute blood loss anemia 03/25/2014   Adult body mass index 28.0-28.9 09/30/2013   Diabetes mellitus, type 2 (HCC) 08/09/2013   Type 2 diabetes mellitus without complication (HCC) 08/09/2013   PVC's (premature ventricular contractions) 07/04/2013   Hyperlipidemia 01/03/2013   Automatic implantable cardioverter-defibrillator in situ 03/30/2012   S/P MVR (mitral valve repair) 01/16/2012   HTN (hypertension) 11/28/2011   Chronic systolic heart failure (HCC) 10/31/2011   Ischemic cardiomyopathy 10/31/2011   CKD (chronic kidney disease) stage 3, GFR 30-59 ml/min (HCC) 10/31/2011   Current use of long term anticoagulation 10/31/2011   Long term (current) use of anticoagulants 10/28/2011   S/P mitral valve repair 10/17/2011   S/P CABG x 3 10/17/2011   Mitral regurgitation 10/12/2011   NSTEMI (non-ST elevated myocardial infarction) (HCC) 10/11/2011    PCP: Rebekah Chesterfield, NP  REFERRING PROVIDER: Harle Battiest, PA-C    REFERRING DIAG: Status post insertion of spinal cord stimulator; Spinal stenosis, lumbar region with neurogenic claudication   Rationale for Evaluation and Treatment: Rehabilitation  THERAPY DIAG:  Muscle weakness (generalized)  ONSET DATE: 1964  SUBJECTIVE:  SUBJECTIVE STATEMENT: Doing okay LBP 5-6/10 PERTINENT HISTORY:  History of a MI, hypertension, atrial fibrillation, diabetes, CKD, pacemaker, arthritis, and chronic left shoulder pain  PAIN:  Are you having pain? Yes: NPRS scale:  /10 Pain location: low back  Pain description: "grabbing"  Aggravating factors: walking Relieving factors: turning up his stimulator  PRECAUTIONS: ICD/Pacemaker  WEIGHT BEARING RESTRICTIONS: No  FALLS:  Has patient fallen in last 6 months? No  LIVING ENVIRONMENT: Lives with: lives with their spouse Lives in: House/apartment Stairs: No Has following equipment at home: Single point cane and Ramped entry  OCCUPATION: retired  PLOF: Independent  PATIENT GOALS: improved strength and balance  NEXT MD VISIT: 05/11/23  OBJECTIVE:   SCREENING FOR RED FLAGS: Bowel or bladder incontinence: No Spinal tumors: No Cauda equina syndrome: No Compression fracture: No Abdominal aneurysm: No  COGNITION: Overall cognitive status: Within functional limits for tasks assessed     SENSATION: Light touch: Impaired  and diminished sensation at right lateral knee with no dermatomal pattern. Patient reports that he has neuropathy in both feet  POSTURE: forward head, decreased lumbar lordosis, and increased thoracic kyphosis  LOWER EXTREMITY ROM: WFL for activities assessed  LOWER EXTREMITY MMT:    MMT Right eval Left eval  Hip flexion 3+/5 3+/5  Hip extension    Hip abduction    Hip adduction    Hip  internal rotation    Hip external rotation    Knee flexion 3+/5 4-/5  Knee extension 4-/5 3+/5; knee pain  Ankle dorsiflexion 3+/5 3+/5  Ankle plantarflexion    Ankle inversion    Ankle eversion     (Blank rows = not tested)  FUNCTIONAL TESTS:  5 times sit to stand: 36.99 seconds with UE support from armrests Timed up and go (TUG): 18.28 seconds with SPC   BALANCE:  Narrow BOS, firm surface, eyes open: 30 seconds  Narrow BOS, firm surface, eyes closed: 3 seconds  Semi-tandem, firm surface, eyes open, left leading: 25 seconds  Semi-tandem, firm surface, eyes open, right leading: 30 seconds  GAIT: Assistive device utilized: Single point cane Level of assistance: Modified independence Comments: decreased stride length and gait speed with poor foot clearance bilaterally  TODAY'S TREATMENT:                                                                                                                              DATE:              03-07-23                                    EXERCISE LOG  Exercise Repetitions and Resistance Comments  Nustep L3 x   min   SOC Posture control x 10    Sit to stand X10 off high mat   AB bracing x10   Standing marching 2x10   LAQ  2# 2x10 hold 3-5 secs Bil   HS curl Green tband x 15     Bil    Blank cell = exercise not performed today    PATIENT EDUCATION:  Education details: POC, objective findings, prognosis, and goals for therapy Person educated: Patient Education method: Explanation Education comprehension: verbalized understanding  HOME EXERCISE PROGRAM:   ASSESSMENT:  CLINICAL IMPRESSION:  Pt arrived today doing fair with LBP and was able to perform LB and LE therex. Pt fatigues very easily so standing and sitting exs were alternated and tolerated well.       OBJECTIVE IMPAIRMENTS: Abnormal gait, decreased activity tolerance, decreased balance, decreased mobility, difficulty walking, decreased strength, impaired sensation, postural  dysfunction, and pain.   ACTIVITY LIMITATIONS: lifting, standing, stairs, transfers, and locomotion level  PARTICIPATION LIMITATIONS: shopping and community activity  PERSONAL FACTORS: Age, Time since onset of injury/illness/exacerbation, and 3+ comorbidities: History of a MI, hypertension, atrial fibrillation, diabetes, CKD, pacemaker, arthritis, and chronic left shoulder pain  are also affecting patient's functional outcome.   REHAB POTENTIAL: Fair    CLINICAL DECISION MAKING: Evolving/moderate complexity  EVALUATION COMPLEXITY: Moderate   GOALS: Goals reviewed with patient? Yes  SHORT TERM GOALS: Target date: 03/21/23  Patient will be independent with his initial HEP. Baseline: Goal status: INITIAL  2.  Patient will improve his 5 times sit to stand time to 30 seconds or less for improved lower extremity power. Baseline:  Goal status: INITIAL  3.  Patient will improve his timed up and go to 15 seconds or less for improved mobility. Baseline:  Goal status: INITIAL  LONG TERM GOALS: Target date: 04/11/23  Patient will be independent with his advanced HEP. Baseline:  Goal status: INITIAL  2.  Patient will improve his 5 times sit to stand time to 20 seconds or less for improved lower extremity power. Baseline:  Goal status: INITIAL  3.  Patient will be able to transfer from sitting to standing without upper extremity support for improved independence. Baseline:  Goal status: INITIAL  4.  Patient will improve his timed up and go to 12 seconds or less for reduced fall risk. Baseline:  Goal status: INITIAL  PLAN:  PT FREQUENCY: 2x/week  PT DURATION: 6 weeks  PLANNED INTERVENTIONS: Therapeutic exercises, Therapeutic activity, Neuromuscular re-education, Balance training, Gait training, Patient/Family education, Self Care, Joint mobilization, Spinal mobilization, Cryotherapy, Moist heat, Manual therapy, and Re-evaluation.  PLAN FOR NEXT SESSION: nustep, lower  extremity strengthening, and balance interventions   Aretha Levi,CHRIS, PTA 03/07/2023, 2:54 PM

## 2023-03-09 ENCOUNTER — Ambulatory Visit: Payer: Medicare HMO

## 2023-03-09 DIAGNOSIS — M6281 Muscle weakness (generalized): Secondary | ICD-10-CM | POA: Diagnosis not present

## 2023-03-09 DIAGNOSIS — R2681 Unsteadiness on feet: Secondary | ICD-10-CM

## 2023-03-09 NOTE — Progress Notes (Signed)
Remote ICD transmission.   

## 2023-03-09 NOTE — Therapy (Signed)
OUTPATIENT PHYSICAL THERAPY THORACOLUMBAR TREATMENT   Patient Name: Wayne Gordon MRN: [id number] DOB:October 18, 1933, 89 y.o., male Today's Date: 03/09/2023  END OF SESSION:  PT End of Session - 03/09/23 1351     Visit Number 3    Number of Visits 12    Date for PT Re-Evaluation 05/26/23    PT Start Time 1345    PT Stop Time 1426    PT Time Calculation (min) 41 min    Activity Tolerance Patient tolerated treatment well    Behavior During Therapy [facility] for tasks assessed/performed              Past Medical History:  Diagnosis Date   Allergy to ACE inhibitors    Angioedema many years ago; patient has tolerated Losartan (ARB) in the past - it was held during [facility] for GI bleed and worsening renal function >> resume Losartan 25 mg QD 06/2015   Arthritis    Atrial fibrillation (HCC)    PAF, CHADs2Vasc = 5   Carotid artery disease (HCC)    60-79% LICA   CHF (congestive heart failure) (HCC)    CKD (chronic kidney disease)    Coronary artery disease    Diabetes mellitus    GERD (gastroesophageal reflux disease)    Hyperlipidemia    Hypertension    ICD (implantable cardiac defibrillator) in place 03/28/2012   [place]ron[place], Dr. Ladona Ridgel 03/28/12   Ischemic cardiomyopathy    S/P CABG x 3; EF 25%   Mitral regurgitation    S/P mitral valve repair 2013   Myocardial infarction [facility])    Renal insufficiency    Past Surgical History:  Procedure Laterality Date   BIV UPGRADE N/A 10/12/2020   Procedure: BIV ICD UPGRADE;  Surgeon: Marinus Maw, MD;  Location: [facility] INVASIVE CV LAB;  Service: Cardiovascular;  Laterality: N/A;   CHOLECYSTECTOMY N/A 09/20/2017   Procedure: LAPAROSCOPIC SUBTOTAL CHOLECYSTECTOMY;  Surgeon: Axel Filler, MD;  Location: [facility] OR;  Service: General;  Laterality: N/A;   COLONOSCOPY N/A 04/24/2014   RMR Pancolonic diverticulosis   CORONARY ARTERY BYPASS GRAFT  10/17/2011   Procedure: CORONARY ARTERY BYPASS GRAFTING (CABG);  Surgeon: Purcell Nails, MD;  Location: [facility]  OR;  Service: Open Heart Surgery;  Laterality: N/A;   ERCP N/A 09/29/2017   Procedure: ENDOSCOPIC RETROGRADE CHOLANGIOPANCREATOGRAPHY (ERCP);  Surgeon: Jeani Hawking, MD;  Location: [facility] ENDOSCOPY;  Service: Endoscopy;  Laterality: N/A;   ESOPHAGOGASTRODUODENOSCOPY N/A 04/24/2014   RMR Subtle nodularity the gastric mucosa of uncertain significance-status post biopsy. Hiatal hernia. chronic inflammation, no H.pylori   ESOPHAGOGASTRODUODENOSCOPY N/A 06/24/2016   Dr. Jena Gauss: normal esophagus, small hiatal hernia, normal duodenum   ICD  03/28/2012   IMPLANTABLE CARDIOVERTER DEFIBRILLATOR IMPLANT N/A 03/28/2012   Procedure: IMPLANTABLE CARDIOVERTER DEFIBRILLATOR IMPLANT;  Surgeon: Marinus Maw, MD;  Location: [facility] CATH LAB;  Service: Cardiovascular;  Laterality: N/A;   KNEE ARTHROSCOPY  ~ 2008   right   LEFT AND RIGHT HEART CATHETERIZATION WITH CORONARY ANGIOGRAM N/A 10/12/2011   Procedure: LEFT AND RIGHT HEART CATHETERIZATION WITH CORONARY ANGIOGRAM;  Surgeon: Iran Ouch, MD;  Location: [facility] CATH LAB;  Service: Cardiovascular;  Laterality: N/A;   MITRAL VALVE REPAIR  10/17/2011   Procedure: MITRAL VALVE REPAIR (MVR);  Surgeon: Purcell Nails, MD;  Location: [facility] OR;  Service: Open Heart Surgery;  Laterality: N/A;   NASAL SINUS SURGERY  1990's   right   Patient Active Problem List   Diagnosis Date Noted   Human metapneumovirus pneumonia 10/16/2021  Acute on chronic systolic CHF (congestive heart failure) (HCC) 10/16/2021   CHF exacerbation (HCC) 10/15/2021   CAD (coronary artery disease) of artery bypass graft 10/15/2021   History of implantable cardioverter-defibrillator (ICD) placement 10/15/2021   Benign essential HTN 10/15/2021   Chronic kidney disease, stage 3b (HCC) 10/15/2021   Acute respiratory failure with hypoxia (HCC) 10/14/2021   Bloating 02/27/2020   Nausea with vomiting 02/27/2020   Abdominal pain 02/27/2020   Iron deficiency anemia 02/08/2018   Stage III chronic kidney disease  (HCC) 02/08/2018   Diabetes mellitus (HCC) 02/08/2018   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   Bacteremia due to Klebsiella pneumoniae 09/22/2017   Sepsis due to Klebsiella pneumoniae (HCC) 09/22/2017   Cholangitis 09/17/2017   Leukocytosis 09/17/2017   Cholelithiasis 09/17/2017   Elevated liver enzymes 09/17/2017   Acute renal failure superimposed on stage 3 chronic kidney disease (HCC) 09/17/2017   Low blood pressure reading 09/17/2017   Lactic acidosis 09/17/2017   Elevated troponin 09/17/2017   Nausea without vomiting 01/10/2017   Gastrointestinal hemorrhage associated with intestinal diverticulosis 06/23/2016   Carotid artery disease (HCC)    Syncope and collapse 07/11/2015   Ventricular arrhythmia 07/10/2015   Complete heart block (HCC)    ICD (implantable cardioverter-defibrillator) discharge    Atrial fibrillation (HCC) 06/04/2015   Diabetes (HCC) 06/02/2015   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/02/2015   GERD (gastroesophageal reflux disease) 02/18/2015   Abdominal pain, epigastric 02/18/2015   Pleural effusion 09/01/2014   Rectal bleeding 03/25/2014   Acute blood loss anemia 03/25/2014   Adult body mass index 28.0-28.9 09/30/2013   Diabetes mellitus, type 2 (HCC) 08/09/2013   Type 2 diabetes mellitus without complication (HCC) 08/09/2013   PVC's (premature ventricular contractions) 07/04/2013   Hyperlipidemia 01/03/2013   Automatic implantable cardioverter-defibrillator in situ 03/30/2012   S/P MVR (mitral valve repair) 01/16/2012   HTN (hypertension) 11/28/2011   Chronic systolic heart failure (HCC) 10/31/2011   Ischemic cardiomyopathy 10/31/2011   CKD (chronic kidney disease) stage 3, GFR 30-59 ml/min (HCC) 10/31/2011   Current use of long term anticoagulation 10/31/2011   Long term (current) use of anticoagulants 10/28/2011   S/P mitral valve repair 10/17/2011   S/P CABG x 3 10/17/2011   Mitral regurgitation 10/12/2011   NSTEMI (non-ST elevated myocardial  infarction) (HCC) 10/11/2011    PCP: Rebekah Chesterfield, NP  REFERRING PROVIDER: Harle Battiest, PA-C   REFERRING DIAG: Status post insertion of spinal cord stimulator; Spinal stenosis, lumbar region with neurogenic claudication   Rationale for Evaluation and Treatment: Rehabilitation  THERAPY DIAG:  Muscle weakness (generalized)  Unsteadiness on feet  ONSET DATE: 1964  SUBJECTIVE:  SUBJECTIVE STATEMENT: Patient reports that he felt good after his last appointment. However, he feels "rough" today with no known cause as "everyday feels different."   PERTINENT HISTORY:  History of a MI, hypertension, atrial fibrillation, diabetes, CKD, pacemaker, arthritis, and chronic left shoulder pain  PAIN:  Are you having pain? Yes: NPRS scale:  /10 Pain location: low back  Pain description: "grabbing"  Aggravating factors: walking Relieving factors: turning up his stimulator  PRECAUTIONS: ICD/Pacemaker  WEIGHT BEARING RESTRICTIONS: No  FALLS:  Has patient fallen in last 6 months? No  LIVING ENVIRONMENT: Lives with: lives with their spouse Lives in: House/apartment Stairs: No Has following equipment at home: Single point cane and Ramped entry  OCCUPATION: retired  PLOF: Independent  PATIENT GOALS: improved strength and balance  NEXT MD VISIT: 05/11/23  OBJECTIVE:   SCREENING FOR RED FLAGS: Bowel or bladder incontinence: No Spinal tumors: No Cauda equina syndrome: No Compression fracture: No Abdominal aneurysm: No  COGNITION: Overall cognitive status: Within functional limits for tasks assessed     SENSATION: Light touch: Impaired  and diminished sensation at right lateral knee with no dermatomal pattern. Patient reports that he has neuropathy in both feet  POSTURE: forward  head, decreased lumbar lordosis, and increased thoracic kyphosis  LOWER EXTREMITY ROM: WFL for activities assessed  LOWER EXTREMITY MMT:    MMT Right eval Left eval  Hip flexion 3+/5 3+/5  Hip extension    Hip abduction    Hip adduction    Hip internal rotation    Hip external rotation    Knee flexion 3+/5 4-/5  Knee extension 4-/5 3+/5; knee pain  Ankle dorsiflexion 3+/5 3+/5  Ankle plantarflexion    Ankle inversion    Ankle eversion     (Blank rows = not tested)  FUNCTIONAL TESTS:  5 times sit to stand: 36.99 seconds with UE support from armrests Timed up and go (TUG): 18.28 seconds with SPC   BALANCE:  Narrow BOS, firm surface, eyes open: 30 seconds  Narrow BOS, firm surface, eyes closed: 3 seconds  Semi-tandem, firm surface, eyes open, left leading: 25 seconds  Semi-tandem, firm surface, eyes open, right leading: 30 seconds  GAIT: Assistive device utilized: Single point cane Level of assistance: Modified independence Comments: decreased stride length and gait speed with poor foot clearance bilaterally  TODAY'S TREATMENT:                                                                                                                              DATE:                                                 03/09/23 EXERCISE LOG  Exercise Repetitions and Resistance Comments  Nustep L3 x 15 minutes   LAQ 20 reps each   Seated hip  ADD isometric  3 minutes w/ 5 second hold   Seated marching  3 minutes    Seated rocker board  3 minutes   Resisted clam Red t-band x 3 minutes    Sit to stand  3 reps  Limited by left knee pain   Blank cell = exercise not performed today                                     03-07-23 EXERCISE LOG  Exercise Repetitions and Resistance Comments  Nustep L3 x   min   SOC Posture control x 10    Sit to stand X10 off high mat   AB bracing x10   Standing marching 2x10   LAQ 2# 2x10 hold 3-5 secs Bil   HS curl Green tband x 15     Bil    Blank cell  = exercise not performed today    PATIENT EDUCATION:  Education details: POC, objective findings, prognosis, and goals for therapy Person educated: Patient Education method: Explanation Education comprehension: verbalized understanding  HOME EXERCISE PROGRAM:   ASSESSMENT:  CLINICAL IMPRESSION: Treatment focused on seated interventions, per patients request due to pain and fatigue. He required minimal cueing with today's new interventions for proper exercise performance. His left knee pain was his primary limitation with today's interventions as he this limited his ability to complete sit to stand transfers. He reported feeling "ok" upon the conclusion of treatment. He continues to require skilled physical therapy to address his remaining impairments to maximize his functional mobility.   OBJECTIVE IMPAIRMENTS: Abnormal gait, decreased activity tolerance, decreased balance, decreased mobility, difficulty walking, decreased strength, impaired sensation, postural dysfunction, and pain.   ACTIVITY LIMITATIONS: lifting, standing, stairs, transfers, and locomotion level  PARTICIPATION LIMITATIONS: shopping and community activity  PERSONAL FACTORS: Age, Time since onset of injury/illness/exacerbation, and 3+ comorbidities: History of a MI, hypertension, atrial fibrillation, diabetes, CKD, pacemaker, arthritis, and chronic left shoulder pain  are also affecting patient's functional outcome.   REHAB POTENTIAL: Fair    CLINICAL DECISION MAKING: Evolving/moderate complexity  EVALUATION COMPLEXITY: Moderate   GOALS: Goals reviewed with patient? Yes  SHORT TERM GOALS: Target date: 03/21/23  Patient will be independent with his initial HEP. Baseline: Goal status: INITIAL  2.  Patient will improve his 5 times sit to stand time to 30 seconds or less for improved lower extremity power. Baseline:  Goal status: INITIAL  3.  Patient will improve his timed up and go to 15 seconds or less for  improved mobility. Baseline:  Goal status: INITIAL  LONG TERM GOALS: Target date: 04/11/23  Patient will be independent with his advanced HEP. Baseline:  Goal status: INITIAL  2.  Patient will improve his 5 times sit to stand time to 20 seconds or less for improved lower extremity power. Baseline:  Goal status: INITIAL  3.  Patient will be able to transfer from sitting to standing without upper extremity support for improved independence. Baseline:  Goal status: INITIAL  4.  Patient will improve his timed up and go to 12 seconds or less for reduced fall risk. Baseline:  Goal status: INITIAL  PLAN:  PT FREQUENCY: 2x/week  PT DURATION: 6 weeks  PLANNED INTERVENTIONS: Therapeutic exercises, Therapeutic activity, Neuromuscular re-education, Balance training, Gait training, Patient/Family education, Self Care, Joint mobilization, Spinal mobilization, Cryotherapy, Moist heat, Manual therapy, and Re-evaluation.  PLAN FOR NEXT SESSION: nustep, lower extremity  strengthening, and balance interventions   Granville Lewis, PT 03/09/2023, 2:29 PM

## 2023-03-14 ENCOUNTER — Encounter: Payer: Self-pay | Admitting: *Deleted

## 2023-03-14 ENCOUNTER — Ambulatory Visit: Payer: Medicare HMO | Admitting: *Deleted

## 2023-03-14 DIAGNOSIS — R2681 Unsteadiness on feet: Secondary | ICD-10-CM

## 2023-03-14 DIAGNOSIS — M6281 Muscle weakness (generalized): Secondary | ICD-10-CM | POA: Diagnosis not present

## 2023-03-14 NOTE — Therapy (Signed)
OUTPATIENT PHYSICAL THERAPY THORACOLUMBAR TREATMENT   Patient Name: Wayne Gordon MRN: [id number] DOB:[birth year], 89 y.o., male Today's Date: 03/14/2023  END OF SESSION:  PT End of Session - 03/14/23 1400     Visit Number 4    Number of Visits 12    Date for PT Re-Evaluation 05/26/23    PT Start Time 1345    PT Stop Time 1433    PT Time Calculation (min) 48 min              Past Medical History:  Diagnosis Date   Allergy to ACE inhibitors    Angioedema many years ago; patient has tolerated Losartan (ARB) in the past - it was held during [facility] for GI bleed and worsening renal function >> resume Losartan 25 mg QD 06/2015   Arthritis    Atrial fibrillation (HCC)    PAF, CHADs2Vasc = 5   Carotid artery disease (HCC)    60-79% LICA   CHF (congestive heart failure) (HCC)    CKD (chronic kidney disease)    Coronary artery disease    Diabetes mellitus    GERD (gastroesophageal reflux disease)    Hyperlipidemia    Hypertension    ICD (implantable cardiac defibrillator) in place 03/28/2012   [place]ron[place], Dr. Ladona Ridgel 03/28/12   Ischemic cardiomyopathy    S/P CABG x 3; EF 25%   Mitral regurgitation    S/P mitral valve repair 2013   Myocardial infarction [facility])    Renal insufficiency    Past Surgical History:  Procedure Laterality Date   BIV UPGRADE N/A 10/12/2020   Procedure: BIV ICD UPGRADE;  Surgeon: Marinus Maw, MD;  Location: [facility] INVASIVE CV LAB;  Service: Cardiovascular;  Laterality: N/A;   CHOLECYSTECTOMY N/A 09/20/2017   Procedure: LAPAROSCOPIC SUBTOTAL CHOLECYSTECTOMY;  Surgeon: Axel Filler, MD;  Location: [facility] OR;  Service: General;  Laterality: N/A;   COLONOSCOPY N/A 04/24/2014   RMR Pancolonic diverticulosis   CORONARY ARTERY BYPASS GRAFT  10/17/2011   Procedure: CORONARY ARTERY BYPASS GRAFTING (CABG);  Surgeon: Purcell Nails, MD;  Location: [facility] OR;  Service: Open Heart Surgery;  Laterality: N/A;   ERCP N/A 09/29/2017   Procedure: ENDOSCOPIC RETROGRADE  CHOLANGIOPANCREATOGRAPHY (ERCP);  Surgeon: Jeani Hawking, MD;  Location: [facility] ENDOSCOPY;  Service: Endoscopy;  Laterality: N/A;   ESOPHAGOGASTRODUODENOSCOPY N/A 04/24/2014   RMR Subtle nodularity the gastric mucosa of uncertain significance-status post biopsy. Hiatal hernia. chronic inflammation, no H.pylori   ESOPHAGOGASTRODUODENOSCOPY N/A 06/24/2016   Dr. Jena Gauss: normal esophagus, small hiatal hernia, normal duodenum   ICD  03/28/2012   IMPLANTABLE CARDIOVERTER DEFIBRILLATOR IMPLANT N/A 03/28/2012   Procedure: IMPLANTABLE CARDIOVERTER DEFIBRILLATOR IMPLANT;  Surgeon: Marinus Maw, MD;  Location: [facility] CATH LAB;  Service: Cardiovascular;  Laterality: N/A;   KNEE ARTHROSCOPY  ~ 2008   right   LEFT AND RIGHT HEART CATHETERIZATION WITH CORONARY ANGIOGRAM N/A 10/12/2011   Procedure: LEFT AND RIGHT HEART CATHETERIZATION WITH CORONARY ANGIOGRAM;  Surgeon: Iran Ouch, MD;  Location: [facility] CATH LAB;  Service: Cardiovascular;  Laterality: N/A;   MITRAL VALVE REPAIR  10/17/2011   Procedure: MITRAL VALVE REPAIR (MVR);  Surgeon: Purcell Nails, MD;  Location: [facility] OR;  Service: Open Heart Surgery;  Laterality: N/A;   NASAL SINUS SURGERY  1990's   right   Patient Active Problem List   Diagnosis Date Noted   Human metapneumovirus pneumonia 10/16/2021   Acute on chronic systolic CHF (congestive heart failure) (HCC) 10/16/2021   CHF exacerbation (HCC) 10/15/2021  CAD (coronary artery disease) of artery bypass graft 10/15/2021   History of implantable cardioverter-defibrillator (ICD) placement 10/15/2021   Benign essential HTN 10/15/2021   Chronic kidney disease, stage 3b (HCC) 10/15/2021   Acute respiratory failure with hypoxia (HCC) 10/14/2021   Bloating 02/27/2020   Nausea with vomiting 02/27/2020   Abdominal pain 02/27/2020   Iron deficiency anemia 02/08/2018   Stage III chronic kidney disease (HCC) 02/08/2018   Diabetes mellitus (HCC) 02/08/2018   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   Bacteremia  due to Klebsiella pneumoniae 09/22/2017   Sepsis due to Klebsiella pneumoniae (HCC) 09/22/2017   Cholangitis 09/17/2017   Leukocytosis 09/17/2017   Cholelithiasis 09/17/2017   Elevated liver enzymes 09/17/2017   Acute renal failure superimposed on stage 3 chronic kidney disease (HCC) 09/17/2017   Low blood pressure reading 09/17/2017   Lactic acidosis 09/17/2017   Elevated troponin 09/17/2017   Nausea without vomiting 01/10/2017   Gastrointestinal hemorrhage associated with intestinal diverticulosis 06/23/2016   Carotid artery disease (HCC)    Syncope and collapse 07/11/2015   Ventricular arrhythmia 07/10/2015   Complete heart block (HCC)    ICD (implantable cardioverter-defibrillator) discharge    Atrial fibrillation (HCC) 06/04/2015   Diabetes (HCC) 06/02/2015   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/02/2015   GERD (gastroesophageal reflux disease) 02/18/2015   Abdominal pain, epigastric 02/18/2015   Pleural effusion 09/01/2014   Rectal bleeding 03/25/2014   Acute blood loss anemia 03/25/2014   Adult body mass index 28.0-28.9 09/30/2013   Diabetes mellitus, type 2 (HCC) 08/09/2013   Type 2 diabetes mellitus without complication (HCC) 08/09/2013   PVC's (premature ventricular contractions) 07/04/2013   Hyperlipidemia 01/03/2013   Automatic implantable cardioverter-defibrillator in situ 03/30/2012   S/P MVR (mitral valve repair) 01/16/2012   HTN (hypertension) 11/28/2011   Chronic systolic heart failure (HCC) 10/31/2011   Ischemic cardiomyopathy 10/31/2011   CKD (chronic kidney disease) stage 3, GFR 30-59 ml/min (HCC) 10/31/2011   Current use of long term anticoagulation 10/31/2011   Long term (current) use of anticoagulants 10/28/2011   S/P mitral valve repair 10/17/2011   S/P CABG x 3 10/17/2011   Mitral regurgitation 10/12/2011   NSTEMI (non-ST elevated myocardial infarction) (HCC) 10/11/2011    PCP: Rebekah Chesterfield, NP  REFERRING PROVIDER: Harle Battiest, PA-C    REFERRING DIAG: Status post insertion of spinal cord stimulator; Spinal stenosis, lumbar region with neurogenic claudication   Rationale for Evaluation and Treatment: Rehabilitation  THERAPY DIAG:  Muscle weakness (generalized)  Unsteadiness on feet  ONSET DATE: 1962  SUBJECTIVE:  SUBJECTIVE STATEMENT: Patient reports that he felt good after his last appointment.Doing good today    PERTINENT HISTORY:  History of a MI, hypertension, atrial fibrillation, diabetes, CKD, pacemaker, arthritis, and chronic left shoulder pain  PAIN:  Are you having pain? Yes: NPRS scale:  /10 Pain location: low back  Pain description: "grabbing"  Aggravating factors: walking Relieving factors: turning up his stimulator  PRECAUTIONS: ICD/Pacemaker  WEIGHT BEARING RESTRICTIONS: No  FALLS:  Has patient fallen in last 6 months? No  LIVING ENVIRONMENT: Lives with: lives with their spouse Lives in: House/apartment Stairs: No Has following equipment at home: Single point cane and Ramped entry  OCCUPATION: retired  PLOF: Independent  PATIENT GOALS: improved strength and balance  NEXT MD VISIT: 05/11/23  OBJECTIVE:   SCREENING FOR RED FLAGS: Bowel or bladder incontinence: No Spinal tumors: No Cauda equina syndrome: No Compression fracture: No Abdominal aneurysm: No  COGNITION: Overall cognitive status: Within functional limits for tasks assessed     SENSATION: Light touch: Impaired  and diminished sensation at right lateral knee with no dermatomal pattern. Patient reports that he has neuropathy in both feet  POSTURE: forward head, decreased lumbar lordosis, and increased thoracic kyphosis  LOWER EXTREMITY ROM: WFL for activities assessed  LOWER EXTREMITY MMT:    MMT Right eval Left eval  Hip  flexion 3+/5 3+/5  Hip extension    Hip abduction    Hip adduction    Hip internal rotation    Hip external rotation    Knee flexion 3+/5 4-/5  Knee extension 4-/5 3+/5; knee pain  Ankle dorsiflexion 3+/5 3+/5  Ankle plantarflexion    Ankle inversion    Ankle eversion     (Blank rows = not tested)  FUNCTIONAL TESTS:  5 times sit to stand: 36.99 seconds with UE support from armrests Timed up and go (TUG): 18.28 seconds with SPC   BALANCE:  Narrow BOS, firm surface, eyes open: 30 seconds  Narrow BOS, firm surface, eyes closed: 3 seconds  Semi-tandem, firm surface, eyes open, left leading: 25 seconds  Semi-tandem, firm surface, eyes open, right leading: 30 seconds  GAIT: Assistive device utilized: Single point cane Level of assistance: Modified independence Comments: decreased stride length and gait speed with poor foot clearance bilaterally  TODAY'S TREATMENT:                                                                                                                              DATE:                                                 03/09/23 EXERCISE LOG  Exercise Repetitions and Resistance Comments  Nustep L3 x 15 minutes   LAQ  2x10 reps each  2#   Seated hip ADD isometric     Seated  marching  3x10 2#   Seated rocker board     Resisted clam Red t-band x 3 minutes    Sit to stand  2x 5   reps   high mat table Limited by left knee pain   Blank cell = exercise not performed today                                     03-07-23 EXERCISE LOG  Exercise Repetitions and Resistance Comments  Nustep L3 x   min   SOC Posture control x 10    Sit to stand X10 off high mat   AB bracing x10   Standing marching 2x10   LAQ 2# 2x10 hold 3-5 secs Bil   HS curl Green tband x 15     Bil    Blank cell = exercise not performed today    PATIENT EDUCATION:  Education details: POC, objective findings, prognosis, and goals for therapy Person educated: Patient Education method:  Explanation Education comprehension: verbalized understanding  HOME EXERCISE PROGRAM:   ASSESSMENT:  CLINICAL IMPRESSION: Pt arrived doing fairly well after last Rx. He was able to continue with OKC and CKC therex for progression of strengthening/endurance exs. Pt having some LT kee pain during sit to stand, but did very well. Mainly fatigue end of session.    OBJECTIVE IMPAIRMENTS: Abnormal gait, decreased activity tolerance, decreased balance, decreased mobility, difficulty walking, decreased strength, impaired sensation, postural dysfunction, and pain.   ACTIVITY LIMITATIONS: lifting, standing, stairs, transfers, and locomotion level  PARTICIPATION LIMITATIONS: shopping and community activity  PERSONAL FACTORS: Age, Time since onset of injury/illness/exacerbation, and 3+ comorbidities: History of a MI, hypertension, atrial fibrillation, diabetes, CKD, pacemaker, arthritis, and chronic left shoulder pain  are also affecting patient's functional outcome.   REHAB POTENTIAL: Fair    CLINICAL DECISION MAKING: Evolving/moderate complexity  EVALUATION COMPLEXITY: Moderate   GOALS: Goals reviewed with patient? Yes  SHORT TERM GOALS: Target date: 03/21/23  Patient will be independent with his initial HEP. Baseline: Goal status: INITIAL  2.  Patient will improve his 5 times sit to stand time to 30 seconds or less for improved lower extremity power. Baseline:  Goal status: INITIAL  3.  Patient will improve his timed up and go to 15 seconds or less for improved mobility. Baseline:  Goal status: INITIAL  LONG TERM GOALS: Target date: 04/11/23  Patient will be independent with his advanced HEP. Baseline:  Goal status: INITIAL  2.  Patient will improve his 5 times sit to stand time to 20 seconds or less for improved lower extremity power. Baseline:  Goal status: INITIAL  3.  Patient will be able to transfer from sitting to standing without upper extremity support for improved  independence. Baseline:  Goal status: INITIAL  4.  Patient will improve his timed up and go to 12 seconds or less for reduced fall risk. Baseline:  Goal status: INITIAL  PLAN:  PT FREQUENCY: 2x/week  PT DURATION: 6 weeks  PLANNED INTERVENTIONS: Therapeutic exercises, Therapeutic activity, Neuromuscular re-education, Balance training, Gait training, Patient/Family education, Self Care, Joint mobilization, Spinal mobilization, Cryotherapy, Moist heat, Manual therapy, and Re-evaluation.  PLAN FOR NEXT SESSION: nustep, lower extremity strengthening, and balance interventions   Stephannie Broner,CHRIS, PTA 03/14/2023, 3:42 PM

## 2023-03-16 ENCOUNTER — Ambulatory Visit: Payer: Medicare HMO | Admitting: *Deleted

## 2023-03-16 DIAGNOSIS — M6281 Muscle weakness (generalized): Secondary | ICD-10-CM

## 2023-03-16 DIAGNOSIS — R2681 Unsteadiness on feet: Secondary | ICD-10-CM

## 2023-03-16 NOTE — Therapy (Signed)
OUTPATIENT PHYSICAL THERAPY THORACOLUMBAR TREATMENT   Patient Name: Wayne Gordon MRN: [id number] DOB:June 05, 1934, 89 y.o., male Today's Date: 03/16/2023  END OF SESSION:  PT End of Session - 03/16/23 1403     Visit Number 5    Number of Visits 12    Date for PT Re-Evaluation 05/26/23    PT Start Time 1600    PT Stop Time 1646    PT Time Calculation (min) 46 min              Past Medical History:  Diagnosis Date   Allergy to ACE inhibitors    Angioedema many years ago; patient has tolerated Losartan (ARB) in the past - it was held during [facility] for GI bleed and worsening renal function >> resume Losartan 25 mg QD 06/2015   Arthritis    Atrial fibrillation (HCC)    PAF, CHADs2Vasc = 5   Carotid artery disease (HCC)    60-79% LICA   CHF (congestive heart failure) (HCC)    CKD (chronic kidney disease)    Coronary artery disease    Diabetes mellitus    GERD (gastroesophageal reflux disease)    Hyperlipidemia    Hypertension    ICD (implantable cardiac defibrillator) in place 03/28/2012   [place], Dr. Ladona Ridgel 03/28/12   Ischemic cardiomyopathy    S/P CABG x 3; EF 25%   Mitral regurgitation    S/P mitral valve repair 2013   Myocardial infarction [facility])    Renal insufficiency    Past Surgical History:  Procedure Laterality Date   BIV UPGRADE N/A 10/12/2020   Procedure: BIV ICD UPGRADE;  Surgeon: Marinus Maw, MD;  Location: [facility] INVASIVE CV LAB;  Service: Cardiovascular;  Laterality: N/A;   CHOLECYSTECTOMY N/A 09/20/2017   Procedure: LAPAROSCOPIC SUBTOTAL CHOLECYSTECTOMY;  Surgeon: Axel Filler, MD;  Location: [facility] OR;  Service: General;  Laterality: N/A;   COLONOSCOPY N/A 04/24/2014   RMR Pancolonic diverticulosis   CORONARY ARTERY BYPASS GRAFT  10/17/2011   Procedure: CORONARY ARTERY BYPASS GRAFTING (CABG);  Surgeon: Purcell Nails, MD;  Location: [facility] OR;  Service: Open Heart Surgery;  Laterality: N/A;   ERCP N/A 09/29/2017   Procedure: ENDOSCOPIC RETROGRADE  CHOLANGIOPANCREATOGRAPHY (ERCP);  Surgeon: Jeani Hawking, MD;  Location: [facility] ENDOSCOPY;  Service: Endoscopy;  Laterality: N/A;   ESOPHAGOGASTRODUODENOSCOPY N/A 04/24/2014   RMR Subtle nodularity the gastric mucosa of uncertain significance-status post biopsy. Hiatal hernia. chronic inflammation, no H.pylori   ESOPHAGOGASTRODUODENOSCOPY N/A 06/24/2016   Dr. Jena Gauss: normal esophagus, small hiatal hernia, normal duodenum   ICD  03/28/2012   IMPLANTABLE CARDIOVERTER DEFIBRILLATOR IMPLANT N/A 03/28/2012   Procedure: IMPLANTABLE CARDIOVERTER DEFIBRILLATOR IMPLANT;  Surgeon: Marinus Maw, MD;  Location: [facility] CATH LAB;  Service: Cardiovascular;  Laterality: N/A;   KNEE ARTHROSCOPY  ~ 2008   right   LEFT AND RIGHT HEART CATHETERIZATION WITH CORONARY ANGIOGRAM N/A 10/12/2011   Procedure: LEFT AND RIGHT HEART CATHETERIZATION WITH CORONARY ANGIOGRAM;  Surgeon: Iran Ouch, MD;  Location: [facility] CATH LAB;  Service: Cardiovascular;  Laterality: N/A;   MITRAL VALVE REPAIR  10/17/2011   Procedure: MITRAL VALVE REPAIR (MVR);  Surgeon: Purcell Nails, MD;  Location: [facility] OR;  Service: Open Heart Surgery;  Laterality: N/A;   NASAL SINUS SURGERY  1990's   right   Patient Active Problem List   Diagnosis Date Noted   Human metapneumovirus pneumonia 10/16/2021   Acute on chronic systolic CHF (congestive heart failure) (HCC) 10/16/2021   CHF exacerbation (HCC) 10/15/2021  CAD (coronary artery disease) of artery bypass graft 10/15/2021   History of implantable cardioverter-defibrillator (ICD) placement 10/15/2021   Benign essential HTN 10/15/2021   Chronic kidney disease, stage 3b (HCC) 10/15/2021   Acute respiratory failure with hypoxia (HCC) 10/14/2021   Bloating 02/27/2020   Nausea with vomiting 02/27/2020   Abdominal pain 02/27/2020   Iron deficiency anemia 02/08/2018   Stage III chronic kidney disease (HCC) 02/08/2018   Diabetes mellitus (HCC) 02/08/2018   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   Bacteremia  due to Klebsiella pneumoniae 09/22/2017   Sepsis due to Klebsiella pneumoniae (HCC) 09/22/2017   Cholangitis 09/17/2017   Leukocytosis 09/17/2017   Cholelithiasis 09/17/2017   Elevated liver enzymes 09/17/2017   Acute renal failure superimposed on stage 3 chronic kidney disease (HCC) 09/17/2017   Low blood pressure reading 09/17/2017   Lactic acidosis 09/17/2017   Elevated troponin 09/17/2017   Nausea without vomiting 01/10/2017   Gastrointestinal hemorrhage associated with intestinal diverticulosis 06/23/2016   Carotid artery disease (HCC)    Syncope and collapse 07/11/2015   Ventricular arrhythmia 07/10/2015   Complete heart block (HCC)    ICD (implantable cardioverter-defibrillator) discharge    Atrial fibrillation (HCC) 06/04/2015   Diabetes (HCC) 06/02/2015   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/02/2015   GERD (gastroesophageal reflux disease) 02/18/2015   Abdominal pain, epigastric 02/18/2015   Pleural effusion 09/01/2014   Rectal bleeding 03/25/2014   Acute blood loss anemia 03/25/2014   Adult body mass index 28.0-28.9 09/30/2013   Diabetes mellitus, type 2 (HCC) 08/09/2013   Type 2 diabetes mellitus without complication (HCC) 08/09/2013   PVC's (premature ventricular contractions) 07/04/2013   Hyperlipidemia 01/03/2013   Automatic implantable cardioverter-defibrillator in situ 03/30/2012   S/P MVR (mitral valve repair) 01/16/2012   HTN (hypertension) 11/28/2011   Chronic systolic heart failure (HCC) 10/31/2011   Ischemic cardiomyopathy 10/31/2011   CKD (chronic kidney disease) stage 3, GFR 30-59 ml/min (HCC) 10/31/2011   Current use of long term anticoagulation 10/31/2011   Long term (current) use of anticoagulants 10/28/2011   S/P mitral valve repair 10/17/2011   S/P CABG x 3 10/17/2011   Mitral regurgitation 10/12/2011   NSTEMI (non-ST elevated myocardial infarction) (HCC) 10/11/2011    PCP: Rebekah Chesterfield, NP  REFERRING PROVIDER: Harle Battiest, PA-C    REFERRING DIAG: Status post insertion of spinal cord stimulator; Spinal stenosis, lumbar region with neurogenic claudication   Rationale for Evaluation and Treatment: Rehabilitation  THERAPY DIAG:  Muscle weakness (generalized)  Unsteadiness on feet  ONSET DATE: 1964  SUBJECTIVE:  SUBJECTIVE STATEMENT: Patient reports not doing good today. Heart has been out of rhythm. Zapped my energy    PERTINENT HISTORY:  History of a MI, hypertension, atrial fibrillation, diabetes, CKD, pacemaker, arthritis, and chronic left shoulder pain  PAIN:  Are you having pain? Yes: NPRS scale:  /10 Pain location: low back  Pain description: "grabbing"  Aggravating factors: walking Relieving factors: turning up his stimulator  PRECAUTIONS: ICD/Pacemaker  WEIGHT BEARING RESTRICTIONS: No  FALLS:  Has patient fallen in last 6 months? No  LIVING ENVIRONMENT: Lives with: lives with their spouse Lives in: House/apartment Stairs: No Has following equipment at home: Single point cane and Ramped entry  OCCUPATION: retired  PLOF: Independent  PATIENT GOALS: improved strength and balance  NEXT MD VISIT: 05/11/23  OBJECTIVE:   SCREENING FOR RED FLAGS: Bowel or bladder incontinence: No Spinal tumors: No Cauda equina syndrome: No Compression fracture: No Abdominal aneurysm: No  COGNITION: Overall cognitive status: Within functional limits for tasks assessed     SENSATION: Light touch: Impaired  and diminished sensation at right lateral knee with no dermatomal pattern. Patient reports that he has neuropathy in both feet  POSTURE: forward head, decreased lumbar lordosis, and increased thoracic kyphosis  LOWER EXTREMITY ROM: WFL for activities assessed  LOWER EXTREMITY MMT:    MMT Right eval  Left eval  Hip flexion 3+/5 3+/5  Hip extension    Hip abduction    Hip adduction    Hip internal rotation    Hip external rotation    Knee flexion 3+/5 4-/5  Knee extension 4-/5 3+/5; knee pain  Ankle dorsiflexion 3+/5 3+/5  Ankle plantarflexion    Ankle inversion    Ankle eversion     (Blank rows = not tested)  FUNCTIONAL TESTS:  5 times sit to stand: 36.99 seconds with UE support from armrests Timed up and go (TUG): 18.28 seconds with SPC   BALANCE:  Narrow BOS, firm surface, eyes open: 30 seconds  Narrow BOS, firm surface, eyes closed: 3 seconds  Semi-tandem, firm surface, eyes open, left leading: 25 seconds  Semi-tandem, firm surface, eyes open, right leading: 30 seconds  GAIT: Assistive device utilized: Single point cane Level of assistance: Modified independence Comments: decreased stride length and gait speed with poor foot clearance bilaterally  TODAY'S TREATMENT:                                                                                                                              DATE:                                                 03/16/23 EXERCISE LOG  Exercise Repetitions and Resistance Comments  Nustep L3 x 15 minutes   LAQ  2x10 reps each  2# Bil    Seated HS curl  Green x20 Bil   Seated marching     Seated rocker board     Resisted clam Green  t-band 3x10  Bil    Sit to stand   Limited by left knee pain  Seated Hip ADD ball squeeze 3x10 hold 5 secs      Blank cell = exercise not performed today                                     03-07-23                             EXERCISE LOG  Exercise Repetitions and Resistance Comments  Nustep L3 x  15 min   SOC Posture control x 10    Sit to stand X10 off high mat   AB bracing x10   Standing marching 2x10   LAQ 2# 2x10 hold 3-5 secs Bil   HS curl Green tband x 15     Bil    Blank cell = exercise not performed today    PATIENT EDUCATION:  Education details: POC, objective findings, prognosis, and  goals for therapy Person educated: Patient Education method: Explanation Education comprehension: verbalized understanding  HOME EXERCISE PROGRAM:   ASSESSMENT:  CLINICAL IMPRESSION: Pt only doing fair today, due to fatigue from heart being out of rhythm. He was able to continue with OKC and CKC therex ,but no progressions. No STG's or LTG's assessed today due to fatigued.    OBJECTIVE IMPAIRMENTS: Abnormal gait, decreased activity tolerance, decreased balance, decreased mobility, difficulty walking, decreased strength, impaired sensation, postural dysfunction, and pain.   ACTIVITY LIMITATIONS: lifting, standing, stairs, transfers, and locomotion level  PARTICIPATION LIMITATIONS: shopping and community activity  PERSONAL FACTORS: Age, Time since onset of injury/illness/exacerbation, and 3+ comorbidities: History of a MI, hypertension, atrial fibrillation, diabetes, CKD, pacemaker, arthritis, and chronic left shoulder pain  are also affecting patient's functional outcome.   REHAB POTENTIAL: Fair    CLINICAL DECISION MAKING: Evolving/moderate complexity  EVALUATION COMPLEXITY: Moderate   GOALS: Goals reviewed with patient? Yes  SHORT TERM GOALS: Target date: 03/21/23  Patient will be independent with his initial HEP. Baseline: Goal status: MET  2.  Patient will improve his 5 times sit to stand time to 30 seconds or less for improved lower extremity power. Baseline:  Goal status: On going  3.  Patient will improve his timed up and go to 15 seconds or less for improved mobility. Baseline:  Goal status: On going  LONG TERM GOALS: Target date: 04/11/23  Patient will be independent with his advanced HEP. Baseline:  Goal status: INITIAL  2.  Patient will improve his 5 times sit to stand time to 20 seconds or less for improved lower extremity power. Baseline:  Goal status: INITIAL  3.  Patient will be able to transfer from sitting to standing without upper extremity  support for improved independence. Baseline:  Goal status: INITIAL  4.  Patient will improve his timed up and go to 12 seconds or less for reduced fall risk. Baseline:  Goal status: INITIAL  PLAN:  PT FREQUENCY: 2x/week  PT DURATION: 6 weeks  PLANNED INTERVENTIONS: Therapeutic exercises, Therapeutic activity, Neuromuscular re-education, Balance training, Gait training, Patient/Family education, Self Care, Joint mobilization, Spinal mobilization, Cryotherapy, Moist heat, Manual therapy, and Re-evaluation.  PLAN FOR NEXT SESSION: nustep, lower extremity strengthening, and balance interventions  Koltyn Kelsay,CHRIS, PTA 03/16/2023, 3:29 PM

## 2023-03-20 ENCOUNTER — Ambulatory Visit: Payer: Medicare HMO

## 2023-03-20 DIAGNOSIS — M6281 Muscle weakness (generalized): Secondary | ICD-10-CM

## 2023-03-20 DIAGNOSIS — R2681 Unsteadiness on feet: Secondary | ICD-10-CM

## 2023-03-20 NOTE — Therapy (Signed)
OUTPATIENT PHYSICAL THERAPY THORACOLUMBAR TREATMENT   Patient Name: Wayne Gordon MRN: [id number] DOB:09-20-1933, 89 y.o., male Today's Date: 03/20/2023  END OF SESSION:  PT End of Session - 03/20/23 1436     Visit Number 6    Number of Visits 12    Date for PT Re-Evaluation 05/26/23    PT Start Time 1430    PT Stop Time 1515    PT Time Calculation (min) 45 min              Past Medical History:  Diagnosis Date   Allergy to ACE inhibitors    Angioedema many years ago; patient has tolerated Losartan (ARB) in the past - it was held during [facility] for GI bleed and worsening renal function >> resume Losartan 25 mg QD 06/2015   Arthritis    Atrial fibrillation (HCC)    PAF, CHADs2Vasc = 5   Carotid artery disease (HCC)    60-79% LICA   CHF (congestive heart failure) (HCC)    CKD (chronic kidney disease)    Coronary artery disease    Diabetes mellitus    GERD (gastroesophageal reflux disease)    Hyperlipidemia    Hypertension    ICD (implantable cardiac defibrillator) in place 03/28/2012   Biotronik, Dr. Ladona Ridgel 03/28/12   Ischemic cardiomyopathy    S/P CABG x 3; EF 25%   Mitral regurgitation    S/P mitral valve repair 2013   Myocardial infarction [facility])    Renal insufficiency    Past Surgical History:  Procedure Laterality Date   BIV UPGRADE N/A 10/12/2020   Procedure: BIV ICD UPGRADE;  Surgeon: Marinus Maw, MD;  Location: [facility] INVASIVE CV LAB;  Service: Cardiovascular;  Laterality: N/A;   CHOLECYSTECTOMY N/A 09/20/2017   Procedure: LAPAROSCOPIC SUBTOTAL CHOLECYSTECTOMY;  Surgeon: Axel Filler, MD;  Location: [facility] OR;  Service: General;  Laterality: N/A;   COLONOSCOPY N/A 04/24/2014   RMR Pancolonic diverticulosis   CORONARY ARTERY BYPASS GRAFT  10/17/2011   Procedure: CORONARY ARTERY BYPASS GRAFTING (CABG);  Surgeon: Purcell Nails, MD;  Location: [facility] OR;  Service: Open Heart Surgery;  Laterality: N/A;   ERCP N/A 09/29/2017   Procedure: ENDOSCOPIC RETROGRADE  CHOLANGIOPANCREATOGRAPHY (ERCP);  Surgeon: Jeani Hawking, MD;  Location: [facility] ENDOSCOPY;  Service: Endoscopy;  Laterality: N/A;   ESOPHAGOGASTRODUODENOSCOPY N/A 04/24/2014   RMR Subtle nodularity the gastric mucosa of uncertain significance-status post biopsy. Hiatal hernia. chronic inflammation, no H.pylori   ESOPHAGOGASTRODUODENOSCOPY N/A 06/24/2016   Dr. Jena Gauss: normal esophagus, small hiatal hernia, normal duodenum   ICD  03/28/2012   IMPLANTABLE CARDIOVERTER DEFIBRILLATOR IMPLANT N/A 03/28/2012   Procedure: IMPLANTABLE CARDIOVERTER DEFIBRILLATOR IMPLANT;  Surgeon: Marinus Maw, MD;  Location: [facility] CATH LAB;  Service: Cardiovascular;  Laterality: N/A;   KNEE ARTHROSCOPY  ~ 2008   right   LEFT AND RIGHT HEART CATHETERIZATION WITH CORONARY ANGIOGRAM N/A 10/12/2011   Procedure: LEFT AND RIGHT HEART CATHETERIZATION WITH CORONARY ANGIOGRAM;  Surgeon: Iran Ouch, MD;  Location: [facility] CATH LAB;  Service: Cardiovascular;  Laterality: N/A;   MITRAL VALVE REPAIR  10/17/2011   Procedure: MITRAL VALVE REPAIR (MVR);  Surgeon: Purcell Nails, MD;  Location: [facility] OR;  Service: Open Heart Surgery;  Laterality: N/A;   NASAL SINUS SURGERY  1990's   right   Patient Active Problem List   Diagnosis Date Noted   Human metapneumovirus pneumonia 10/16/2021   Acute on chronic systolic CHF (congestive heart failure) (HCC) 10/16/2021   CHF exacerbation (HCC) 10/15/2021  CAD (coronary artery disease) of artery bypass graft 10/15/2021   History of implantable cardioverter-defibrillator (ICD) placement 10/15/2021   Benign essential HTN 10/15/2021   Chronic kidney disease, stage 3b (HCC) 10/15/2021   Acute respiratory failure with hypoxia (HCC) 10/14/2021   Bloating 02/27/2020   Nausea with vomiting 02/27/2020   Abdominal pain 02/27/2020   Iron deficiency anemia 02/08/2018   Stage III chronic kidney disease (HCC) 02/08/2018   Diabetes mellitus (HCC) 02/08/2018   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   Bacteremia  due to Klebsiella pneumoniae 09/22/2017   Sepsis due to Klebsiella pneumoniae (HCC) 09/22/2017   Cholangitis 09/17/2017   Leukocytosis 09/17/2017   Cholelithiasis 09/17/2017   Elevated liver enzymes 09/17/2017   Acute renal failure superimposed on stage 3 chronic kidney disease (HCC) 09/17/2017   Low blood pressure reading 09/17/2017   Lactic acidosis 09/17/2017   Elevated troponin 09/17/2017   Nausea without vomiting 01/10/2017   Gastrointestinal hemorrhage associated with intestinal diverticulosis 06/23/2016   Carotid artery disease (HCC)    Syncope and collapse 07/11/2015   Ventricular arrhythmia 07/10/2015   Complete heart block (HCC)    ICD (implantable cardioverter-defibrillator) discharge    Atrial fibrillation (HCC) 06/04/2015   Diabetes (HCC) 06/02/2015   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/02/2015   GERD (gastroesophageal reflux disease) 02/18/2015   Abdominal pain, epigastric 02/18/2015   Pleural effusion 09/01/2014   Rectal bleeding 03/25/2014   Acute blood loss anemia 03/25/2014   Adult body mass index 28.0-28.9 09/30/2013   Diabetes mellitus, type 2 (HCC) 08/09/2013   Type 2 diabetes mellitus without complication (HCC) 08/09/2013   PVC's (premature ventricular contractions) 07/04/2013   Hyperlipidemia 01/03/2013   Automatic implantable cardioverter-defibrillator in situ 03/30/2012   S/P MVR (mitral valve repair) 01/16/2012   HTN (hypertension) 11/28/2011   Chronic systolic heart failure (HCC) 10/31/2011   Ischemic cardiomyopathy 10/31/2011   CKD (chronic kidney disease) stage 3, GFR 30-59 ml/min (HCC) 10/31/2011   Current use of long term anticoagulation 10/31/2011   Long term (current) use of anticoagulants 10/28/2011   S/P mitral valve repair 10/17/2011   S/P CABG x 3 10/17/2011   Mitral regurgitation 10/12/2011   NSTEMI (non-ST elevated myocardial infarction) (HCC) 10/11/2011    PCP: Rebekah Chesterfield, NP  REFERRING PROVIDER: Harle Battiest, PA-C    REFERRING DIAG: Status post insertion of spinal cord stimulator; Spinal stenosis, lumbar region with neurogenic claudication   Rationale for Evaluation and Treatment: Rehabilitation  THERAPY DIAG:  Muscle weakness (generalized)  Unsteadiness on feet  ONSET DATE: 1964  SUBJECTIVE:  SUBJECTIVE STATEMENT: Pt reports feeling better today, but is having some low back and bil knee pain.  PERTINENT HISTORY:  History of a MI, hypertension, atrial fibrillation, diabetes, CKD, pacemaker, arthritis, and chronic left shoulder pain  PAIN:  Are you having pain? Yes: NPRS scale: no number given/10 Pain location: low back  and bil knee Pain description: "grabbing"  Aggravating factors: walking Relieving factors: turning up his stimulator  PRECAUTIONS: ICD/Pacemaker  WEIGHT BEARING RESTRICTIONS: No  FALLS:  Has patient fallen in last 6 months? No  LIVING ENVIRONMENT: Lives with: lives with their spouse Lives in: House/apartment Stairs: No Has following equipment at home: Single point cane and Ramped entry  OCCUPATION: retired  PLOF: Independent  PATIENT GOALS: improved strength and balance  NEXT MD VISIT: 05/11/23  OBJECTIVE:   SCREENING FOR RED FLAGS: Bowel or bladder incontinence: No Spinal tumors: No Cauda equina syndrome: No Compression fracture: No Abdominal aneurysm: No  COGNITION: Overall cognitive status: Within functional limits for tasks assessed     SENSATION: Light touch: Impaired  and diminished sensation at right lateral knee with no dermatomal pattern. Patient reports that he has neuropathy in both feet  POSTURE: forward head, decreased lumbar lordosis, and increased thoracic kyphosis  LOWER EXTREMITY ROM: WFL for activities assessed  LOWER EXTREMITY MMT:    MMT  Right eval Left eval  Hip flexion 3+/5 3+/5  Hip extension    Hip abduction    Hip adduction    Hip internal rotation    Hip external rotation    Knee flexion 3+/5 4-/5  Knee extension 4-/5 3+/5; knee pain  Ankle dorsiflexion 3+/5 3+/5  Ankle plantarflexion    Ankle inversion    Ankle eversion     (Blank rows = not tested)  FUNCTIONAL TESTS:  5 times sit to stand: 36.99 seconds with UE support from armrests Timed up and go (TUG): 18.28 seconds with SPC   BALANCE:  Narrow BOS, firm surface, eyes open: 30 seconds  Narrow BOS, firm surface, eyes closed: 3 seconds  Semi-tandem, firm surface, eyes open, left leading: 25 seconds  Semi-tandem, firm surface, eyes open, right leading: 30 seconds  GAIT: Assistive device utilized: Single point cane Level of assistance: Modified independence Comments: decreased stride length and gait speed with poor foot clearance bilaterally  TODAY'S TREATMENT:                                                                                                                              DATE:                                                 03/20/23 EXERCISE LOG  Exercise Repetitions and Resistance Comments  Nustep L3 x 15 minutes   LAQ 3x10 reps each  2# Bil   Seated HS curl  Green x25 Bil   Seated marching  2# x 2x10 reps   Seated rocker board     Resisted clam Green t-band 3x10  Bil    Sit to stand  5 STS test Limited by left knee pain  Seated Hip ADD ball squeeze 3x10 hold 5 secs      Blank cell = exercise not performed today    PATIENT EDUCATION:  Education details: POC, objective findings, prognosis, and goals for therapy Person educated: Patient Education method: Explanation Education comprehension: verbalized understanding  HOME EXERCISE PROGRAM:   ASSESSMENT:  CLINICAL IMPRESSION:  Pt arrives for today's treatment session reporting mild to moderate low back and bil knee pain.  Pt reports feeling much better today as compared to  last treatment session.  Pt able to perform TUG in 15.3 seconds and 5 STS test in 20.04 seconds meeting his STGs and making good progress towards his LTGs.  Pt unable to perform STS without use of BUEs.  Pt reported minimal increase in left knee pain during seated exercises.  Pt reported mild increase in fatigue and left knee pain at completion of today's treatment session.   OBJECTIVE IMPAIRMENTS: Abnormal gait, decreased activity tolerance, decreased balance, decreased mobility, difficulty walking, decreased strength, impaired sensation, postural dysfunction, and pain.   ACTIVITY LIMITATIONS: lifting, standing, stairs, transfers, and locomotion level  PARTICIPATION LIMITATIONS: shopping and community activity  PERSONAL FACTORS: Age, Time since onset of injury/illness/exacerbation, and 3+ comorbidities: History of a MI, hypertension, atrial fibrillation, diabetes, CKD, pacemaker, arthritis, and chronic left shoulder pain  are also affecting patient's functional outcome.   REHAB POTENTIAL: Fair    CLINICAL DECISION MAKING: Evolving/moderate complexity  EVALUATION COMPLEXITY: Moderate   GOALS: Goals reviewed with patient? Yes  SHORT TERM GOALS: Target date: 03/21/23  Patient will be independent with his initial HEP. Baseline: Goal status: MET  2.  Patient will improve his 5 times sit to stand time to 30 seconds or less for improved lower extremity power. Baseline: 7/22: 20.04 Goal status: MET  3.  Patient will improve his timed up and go to 15 seconds or less for improved mobility. Baseline: 7/22: 15.3 seconds Goal status: MET  LONG TERM GOALS: Target date: 04/11/23  Patient will be independent with his advanced HEP. Baseline:  Goal status: IN PROGRESS  2.  Patient will improve his 5 times sit to stand time to 20 seconds or less for improved lower extremity power. Baseline: 7/22: 20.04 seconds Goal status: IN PROGRESS  3.  Patient will be able to transfer from sitting to  standing without upper extremity support for improved independence. Baseline:  Goal status: IN PROGRESS  4.  Patient will improve his timed up and go to 12 seconds or less for reduced fall risk. Baseline: 7/22: 15.3 seconds Goal status: IN PROGRESS  PLAN:  PT FREQUENCY: 2x/week  PT DURATION: 6 weeks  PLANNED INTERVENTIONS: Therapeutic exercises, Therapeutic activity, Neuromuscular re-education, Balance training, Gait training, Patient/Family education, Self Care, Joint mobilization, Spinal mobilization, Cryotherapy, Moist heat, Manual therapy, and Re-evaluation.  PLAN FOR NEXT SESSION: nustep, lower extremity strengthening, and balance interventions   Newman Pies, PTA 03/20/2023, 4:18 PM

## 2023-03-23 ENCOUNTER — Ambulatory Visit: Payer: Medicare HMO | Admitting: *Deleted

## 2023-03-23 DIAGNOSIS — M6281 Muscle weakness (generalized): Secondary | ICD-10-CM

## 2023-03-23 DIAGNOSIS — R2681 Unsteadiness on feet: Secondary | ICD-10-CM

## 2023-03-23 NOTE — Therapy (Signed)
OUTPATIENT PHYSICAL THERAPY THORACOLUMBAR TREATMENT   Patient Name: Wayne Gordon MRN: [id number] DOB:01-13-1934, 89 y.o., male Today's Date: 03/23/2023  END OF SESSION:  PT End of Session - 03/23/23 1430     Visit Number 7    Number of Visits 12    PT Start Time 1430    PT Stop Time 1517    PT Time Calculation (min) 47 min              Past Medical History:  Diagnosis Date   Allergy to ACE inhibitors    Angioedema many years ago; patient has tolerated Losartan (ARB) in the past - it was held during [facility] for GI bleed and worsening renal function >> resume Losartan 25 mg QD 06/2015   Arthritis    Atrial fibrillation (HCC)    PAF, CHADs2Vasc = 5   Carotid artery disease (HCC)    60-79% LICA   CHF (congestive heart failure) (HCC)    CKD (chronic kidney disease)    Coronary artery disease    Diabetes mellitus    GERD (gastroesophageal reflux disease)    Hyperlipidemia    Hypertension    ICD (implantable cardiac defibrillator) in place 03/28/2012   Biotronik, Dr. Ladona Ridgel 03/28/12   Ischemic cardiomyopathy    S/P CABG x 3; EF 25%   Mitral regurgitation    S/P mitral valve repair 2013   Myocardial infarction [facility])    Renal insufficiency    Past Surgical History:  Procedure Laterality Date   BIV UPGRADE N/A 10/12/2020   Procedure: BIV ICD UPGRADE;  Surgeon: Marinus Maw, MD;  Location: [facility] INVASIVE CV LAB;  Service: Cardiovascular;  Laterality: N/A;   CHOLECYSTECTOMY N/A 09/20/2017   Procedure: LAPAROSCOPIC SUBTOTAL CHOLECYSTECTOMY;  Surgeon: Axel Filler, MD;  Location: [facility] OR;  Service: General;  Laterality: N/A;   COLONOSCOPY N/A 04/24/2014   RMR Pancolonic diverticulosis   CORONARY ARTERY BYPASS GRAFT  10/17/2011   Procedure: CORONARY ARTERY BYPASS GRAFTING (CABG);  Surgeon: Purcell Nails, MD;  Location: [facility] OR;  Service: Open Heart Surgery;  Laterality: N/A;   ERCP N/A 09/29/2017   Procedure: ENDOSCOPIC RETROGRADE CHOLANGIOPANCREATOGRAPHY (ERCP);  Surgeon:  Jeani Hawking, MD;  Location: [facility] ENDOSCOPY;  Service: Endoscopy;  Laterality: N/A;   ESOPHAGOGASTRODUODENOSCOPY N/A 04/24/2014   RMR Subtle nodularity the gastric mucosa of uncertain significance-status post biopsy. Hiatal hernia. chronic inflammation, no H.pylori   ESOPHAGOGASTRODUODENOSCOPY N/A 06/24/2016   Dr. Jena Gauss: normal esophagus, small hiatal hernia, normal duodenum   ICD  03/28/2012   IMPLANTABLE CARDIOVERTER DEFIBRILLATOR IMPLANT N/A 03/28/2012   Procedure: IMPLANTABLE CARDIOVERTER DEFIBRILLATOR IMPLANT;  Surgeon: Marinus Maw, MD;  Location: [facility] CATH LAB;  Service: Cardiovascular;  Laterality: N/A;   KNEE ARTHROSCOPY  ~ 2008   right   LEFT AND RIGHT HEART CATHETERIZATION WITH CORONARY ANGIOGRAM N/A 10/12/2011   Procedure: LEFT AND RIGHT HEART CATHETERIZATION WITH CORONARY ANGIOGRAM;  Surgeon: Iran Ouch, MD;  Location: [facility] CATH LAB;  Service: Cardiovascular;  Laterality: N/A;   MITRAL VALVE REPAIR  10/17/2011   Procedure: MITRAL VALVE REPAIR (MVR);  Surgeon: Purcell Nails, MD;  Location: [facility] OR;  Service: Open Heart Surgery;  Laterality: N/A;   NASAL SINUS SURGERY  1990's   right   Patient Active Problem List   Diagnosis Date Noted   Human metapneumovirus pneumonia 10/16/2021   Acute on chronic systolic CHF (congestive heart failure) (HCC) 10/16/2021   CHF exacerbation (HCC) 10/15/2021   CAD (coronary artery disease) of artery bypass graft  10/15/2021   History of implantable cardioverter-defibrillator (ICD) placement 10/15/2021   Benign essential HTN 10/15/2021   Chronic kidney disease, stage 3b (HCC) 10/15/2021   Acute respiratory failure with hypoxia (HCC) 10/14/2021   Bloating 02/27/2020   Nausea with vomiting 02/27/2020   Abdominal pain 02/27/2020   Iron deficiency anemia 02/08/2018   Stage III chronic kidney disease (HCC) 02/08/2018   Diabetes mellitus (HCC) 02/08/2018   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   Bacteremia due to Klebsiella pneumoniae 09/22/2017    Sepsis due to Klebsiella pneumoniae (HCC) 09/22/2017   Cholangitis 09/17/2017   Leukocytosis 09/17/2017   Cholelithiasis 09/17/2017   Elevated liver enzymes 09/17/2017   Acute renal failure superimposed on stage 3 chronic kidney disease (HCC) 09/17/2017   Low blood pressure reading 09/17/2017   Lactic acidosis 09/17/2017   Elevated troponin 09/17/2017   Nausea without vomiting 01/10/2017   Gastrointestinal hemorrhage associated with intestinal diverticulosis 06/23/2016   Carotid artery disease (HCC)    Syncope and collapse 07/11/2015   Ventricular arrhythmia 07/10/2015   Complete heart block (HCC)    ICD (implantable cardioverter-defibrillator) discharge    Atrial fibrillation (HCC) 06/04/2015   Diabetes (HCC) 06/02/2015   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/02/2015   GERD (gastroesophageal reflux disease) 02/18/2015   Abdominal pain, epigastric 02/18/2015   Pleural effusion 09/01/2014   Rectal bleeding 03/25/2014   Acute blood loss anemia 03/25/2014   Adult body mass index 28.0-28.9 09/30/2013   Diabetes mellitus, type 2 (HCC) 08/09/2013   Type 2 diabetes mellitus without complication (HCC) 08/09/2013   PVC's (premature ventricular contractions) 07/04/2013   Hyperlipidemia 01/03/2013   Automatic implantable cardioverter-defibrillator in situ 03/30/2012   S/P MVR (mitral valve repair) 01/16/2012   HTN (hypertension) 11/28/2011   Chronic systolic heart failure (HCC) 10/31/2011   Ischemic cardiomyopathy 10/31/2011   CKD (chronic kidney disease) stage 3, GFR 30-59 ml/min (HCC) 10/31/2011   Current use of long term anticoagulation 10/31/2011   Long term (current) use of anticoagulants 10/28/2011   S/P mitral valve repair 10/17/2011   S/P CABG x 3 10/17/2011   Mitral regurgitation 10/12/2011   NSTEMI (non-ST elevated myocardial infarction) (HCC) 10/11/2011    PCP: Rebekah Chesterfield, NP  REFERRING PROVIDER: Harle Battiest, PA-C   REFERRING DIAG: Status post insertion of  spinal cord stimulator; Spinal stenosis, lumbar region with neurogenic claudication   Rationale for Evaluation and Treatment: Rehabilitation  THERAPY DIAG:  Muscle weakness (generalized)  Unsteadiness on feet  ONSET DATE: 1964  SUBJECTIVE:  SUBJECTIVE STATEMENT: Pt reports feeling better than last time. More energy today  PERTINENT HISTORY:  History of a MI, hypertension, atrial fibrillation, diabetes, CKD, pacemaker, arthritis, and chronic left shoulder pain  PAIN:  Are you having pain? Yes: NPRS scale: no number given/10 Pain location: low back  and bil knee Pain description: "grabbing"  Aggravating factors: walking Relieving factors: turning up his stimulator  PRECAUTIONS: ICD/Pacemaker  WEIGHT BEARING RESTRICTIONS: No  FALLS:  Has patient fallen in last 6 months? No  LIVING ENVIRONMENT: Lives with: lives with their spouse Lives in: House/apartment Stairs: No Has following equipment at home: Single point cane and Ramped entry  OCCUPATION: retired  PLOF: Independent  PATIENT GOALS: improved strength and balance  NEXT MD VISIT: 05/11/23  OBJECTIVE:   SCREENING FOR RED FLAGS: Bowel or bladder incontinence: No Spinal tumors: No Cauda equina syndrome: No Compression fracture: No Abdominal aneurysm: No  COGNITION: Overall cognitive status: Within functional limits for tasks assessed     SENSATION: Light touch: Impaired  and diminished sensation at right lateral knee with no dermatomal pattern. Patient reports that he has neuropathy in both feet  POSTURE: forward head, decreased lumbar lordosis, and increased thoracic kyphosis  LOWER EXTREMITY ROM: WFL for activities assessed  LOWER EXTREMITY MMT:    MMT Right eval Left eval  Hip flexion 3+/5 3+/5  Hip extension     Hip abduction    Hip adduction    Hip internal rotation    Hip external rotation    Knee flexion 3+/5 4-/5  Knee extension 4-/5 3+/5; knee pain  Ankle dorsiflexion 3+/5 3+/5  Ankle plantarflexion    Ankle inversion    Ankle eversion     (Blank rows = not tested)  FUNCTIONAL TESTS:  5 times sit to stand: 36.99 seconds with UE support from armrests Timed up and go (TUG): 18.28 seconds with SPC   BALANCE:  Narrow BOS, firm surface, eyes open: 30 seconds  Narrow BOS, firm surface, eyes closed: 3 seconds  Semi-tandem, firm surface, eyes open, left leading: 25 seconds  Semi-tandem, firm surface, eyes open, right leading: 30 seconds  GAIT: Assistive device utilized: Single point cane Level of assistance: Modified independence Comments: decreased stride length and gait speed with poor foot clearance bilaterally  TODAY'S TREATMENT:                                                                                                                              DATE:                                                 03/23/23 EXERCISE LOG  Exercise Repetitions and Resistance Comments  Nustep L3 x 15 minutes   LAQ 3x10 reps each  3# Bil   Seated HS curl  Green x25 Bil  Seated marching  3# 3x10 reps   alternating   Standed rocker board     Resisted clam Green t-band 3x10  Bil    Sit to stand  X10   STS    better today Limited by left knee pain  Seated Hip ADD ball squeeze 3x10 hold 5 secs      Blank cell = exercise not performed today    PATIENT EDUCATION:  Education details: POC, objective findings, prognosis, and goals for therapy Person educated: Patient Education method: Explanation Education comprehension: verbalized understanding  HOME EXERCISE PROGRAM:   ASSESSMENT:  CLINICAL IMPRESSION:  Pt arrived today doing fairly well and was able to perform  balance exs today on rocker board as well as  progress to 3# resistance with marching and LAQ's. Pt continues to progress  towards LTG's , but unable to meet this week.   OBJECTIVE IMPAIRMENTS: Abnormal gait, decreased activity tolerance, decreased balance, decreased mobility, difficulty walking, decreased strength, impaired sensation, postural dysfunction, and pain.   ACTIVITY LIMITATIONS: lifting, standing, stairs, transfers, and locomotion level  PARTICIPATION LIMITATIONS: shopping and community activity  PERSONAL FACTORS: Age, Time since onset of injury/illness/exacerbation, and 3+ comorbidities: History of a MI, hypertension, atrial fibrillation, diabetes, CKD, pacemaker, arthritis, and chronic left shoulder pain  are also affecting patient's functional outcome.   REHAB POTENTIAL: Fair    CLINICAL DECISION MAKING: Evolving/moderate complexity  EVALUATION COMPLEXITY: Moderate   GOALS: Goals reviewed with patient? Yes  SHORT TERM GOALS: Target date: 03/21/23  Patient will be independent with his initial HEP. Baseline: Goal status: MET  2.  Patient will improve his 5 times sit to stand time to 30 seconds or less for improved lower extremity power. Baseline: 7/22: 20.04 Goal status: MET  3.  Patient will improve his timed up and go to 15 seconds or less for improved mobility. Baseline: 7/22: 15.3 seconds Goal status: MET  LONG TERM GOALS: Target date: 04/11/23  Patient will be independent with his advanced HEP. Baseline:  Goal status: IN PROGRESS  2.  Patient will improve his 5 times sit to stand time to 20 seconds or less for improved lower extremity power. Baseline: 7/22: 20.04 seconds Goal status: IN PROGRESS  3.  Patient will be able to transfer from sitting to standing without upper extremity support for improved independence. Baseline:  Goal status: IN PROGRESS  4.  Patient will improve his timed up and go to 12 seconds or less for reduced fall risk. Baseline: 7/22: 15.3 seconds Goal status: IN PROGRESS  PLAN:  PT FREQUENCY: 2x/week  PT DURATION: 6 weeks  PLANNED  INTERVENTIONS: Therapeutic exercises, Therapeutic activity, Neuromuscular re-education, Balance training, Gait training, Patient/Family education, Self Care, Joint mobilization, Spinal mobilization, Cryotherapy, Moist heat, Manual therapy, and Re-evaluation.  PLAN FOR NEXT SESSION: nustep, lower extremity strengthening, and balance interventions   Wayde Gopaul,CHRIS, PTA 03/23/2023, 3:31 PM

## 2023-03-28 ENCOUNTER — Ambulatory Visit: Payer: Medicare HMO

## 2023-03-28 DIAGNOSIS — M6281 Muscle weakness (generalized): Secondary | ICD-10-CM | POA: Diagnosis not present

## 2023-03-28 DIAGNOSIS — R2681 Unsteadiness on feet: Secondary | ICD-10-CM

## 2023-03-28 NOTE — Therapy (Signed)
OUTPATIENT PHYSICAL THERAPY THORACOLUMBAR TREATMENT   Patient Name: Wayne Gordon MRN: [id number] DOB:05-Oct-1933, 89 y.o., male Today's Date: 03/28/2023  END OF SESSION:  PT End of Session - 03/28/23 1103     Visit Number 8    Number of Visits 12    Date for PT Re-Evaluation 05/26/23    PT Start Time 1100    PT Stop Time 1145    PT Time Calculation (min) 45 min    Activity Tolerance Patient tolerated treatment well    Behavior During Therapy [facility] for tasks assessed/performed               Past Medical History:  Diagnosis Date   Allergy to ACE inhibitors    Angioedema many years ago; patient has tolerated Losartan (ARB) in the past - it was held during [facility] for GI bleed and worsening renal function >> resume Losartan 25 mg QD 06/2015   Arthritis    Atrial fibrillation (HCC)    PAF, CHADs2Vasc = 5   Carotid artery disease (HCC)    60-79% LICA   CHF (congestive heart failure) (HCC)    CKD (chronic kidney disease)    Coronary artery disease    Diabetes mellitus    GERD (gastroesophageal reflux disease)    Hyperlipidemia    Hypertension    ICD (implantable cardiac defibrillator) in place 03/28/2012   B[place]ron[place], Dr. Ladona Ridgel 03/28/12   Ischemic cardiomyopathy    S/P CABG x 3; EF 25%   Mitral regurgitation    S/P mitral valve repair 2013   Myocardial infarction [facility])    Renal insufficiency    Past Surgical History:  Procedure Laterality Date   BIV UPGRADE N/A 10/12/2020   Procedure: BIV ICD UPGRADE;  Surgeon: Marinus Maw, MD;  Location: [facility] INVASIVE CV LAB;  Service: Cardiovascular;  Laterality: N/A;   CHOLECYSTECTOMY N/A 09/20/2017   Procedure: LAPAROSCOPIC SUBTOTAL CHOLECYSTECTOMY;  Surgeon: Axel Filler, MD;  Location: [facility] OR;  Service: General;  Laterality: N/A;   COLONOSCOPY N/A 04/24/2014   RMR Pancolonic diverticulosis   CORONARY ARTERY BYPASS GRAFT  10/17/2011   Procedure: CORONARY ARTERY BYPASS GRAFTING (CABG);  Surgeon: Purcell Nails, MD;  Location:  [facility] OR;  Service: Open Heart Surgery;  Laterality: N/A;   ERCP N/A 09/29/2017   Procedure: ENDOSCOPIC RETROGRADE CHOLANGIOPANCREATOGRAPHY (ERCP);  Surgeon: Jeani Hawking, MD;  Location: [facility] ENDOSCOPY;  Service: Endoscopy;  Laterality: N/A;   ESOPHAGOGASTRODUODENOSCOPY N/A 04/24/2014   RMR Subtle nodularity the gastric mucosa of uncertain significance-status post biopsy. Hiatal hernia. chronic inflammation, no H.pylori   ESOPHAGOGASTRODUODENOSCOPY N/A 06/24/2016   Dr. Jena Gauss: normal esophagus, small hiatal hernia, normal duodenum   ICD  03/28/2012   IMPLANTABLE CARDIOVERTER DEFIBRILLATOR IMPLANT N/A 03/28/2012   Procedure: IMPLANTABLE CARDIOVERTER DEFIBRILLATOR IMPLANT;  Surgeon: Marinus Maw, MD;  Location: [facility] CATH LAB;  Service: Cardiovascular;  Laterality: N/A;   KNEE ARTHROSCOPY  ~ 2008   right   LEFT AND RIGHT HEART CATHETERIZATION WITH CORONARY ANGIOGRAM N/A 10/12/2011   Procedure: LEFT AND RIGHT HEART CATHETERIZATION WITH CORONARY ANGIOGRAM;  Surgeon: Iran Ouch, MD;  Location: [facility] CATH LAB;  Service: Cardiovascular;  Laterality: N/A;   MITRAL VALVE REPAIR  10/17/2011   Procedure: MITRAL VALVE REPAIR (MVR);  Surgeon: Purcell Nails, MD;  Location: [facility] OR;  Service: Open Heart Surgery;  Laterality: N/A;   NASAL SINUS SURGERY  1990's   right   Patient Active Problem List   Diagnosis Date Noted   Human metapneumovirus pneumonia 10/16/2021  Acute on chronic systolic CHF (congestive heart failure) (HCC) 10/16/2021   CHF exacerbation (HCC) 10/15/2021   CAD (coronary artery disease) of artery bypass graft 10/15/2021   History of implantable cardioverter-defibrillator (ICD) placement 10/15/2021   Benign essential HTN 10/15/2021   Chronic kidney disease, stage 3b (HCC) 10/15/2021   Acute respiratory failure with hypoxia (HCC) 10/14/2021   Bloating 02/27/2020   Nausea with vomiting 02/27/2020   Abdominal pain 02/27/2020   Iron deficiency anemia 02/08/2018   Stage III chronic kidney disease  (HCC) 02/08/2018   Diabetes mellitus (HCC) 02/08/2018   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   Bacteremia due to Klebsiella pneumoniae 09/22/2017   Sepsis due to Klebsiella pneumoniae (HCC) 09/22/2017   Cholangitis 09/17/2017   Leukocytosis 09/17/2017   Cholelithiasis 09/17/2017   Elevated liver enzymes 09/17/2017   Acute renal failure superimposed on stage 3 chronic kidney disease (HCC) 09/17/2017   Low blood pressure reading 09/17/2017   Lactic acidosis 09/17/2017   Elevated troponin 09/17/2017   Nausea without vomiting 01/10/2017   Gastrointestinal hemorrhage associated with intestinal diverticulosis 06/23/2016   Carotid artery disease (HCC)    Syncope and collapse 07/11/2015   Ventricular arrhythmia 07/10/2015   Complete heart block (HCC)    ICD (implantable cardioverter-defibrillator) discharge    Atrial fibrillation (HCC) 06/04/2015   Diabetes (HCC) 06/02/2015   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/02/2015   GERD (gastroesophageal reflux disease) 02/18/2015   Abdominal pain, epigastric 02/18/2015   Pleural effusion 09/01/2014   Rectal bleeding 03/25/2014   Acute blood loss anemia 03/25/2014   Adult body mass index 28.0-28.9 09/30/2013   Diabetes mellitus, type 2 (HCC) 08/09/2013   Type 2 diabetes mellitus without complication (HCC) 08/09/2013   PVC's (premature ventricular contractions) 07/04/2013   Hyperlipidemia 01/03/2013   Automatic implantable cardioverter-defibrillator in situ 03/30/2012   S/P MVR (mitral valve repair) 01/16/2012   HTN (hypertension) 11/28/2011   Chronic systolic heart failure (HCC) 10/31/2011   Ischemic cardiomyopathy 10/31/2011   CKD (chronic kidney disease) stage 3, GFR 30-59 ml/min (HCC) 10/31/2011   Current use of long term anticoagulation 10/31/2011   Long term (current) use of anticoagulants 10/28/2011   S/P mitral valve repair 10/17/2011   S/P CABG x 3 10/17/2011   Mitral regurgitation 10/12/2011   NSTEMI (non-ST elevated myocardial  infarction) (HCC) 10/11/2011    PCP: Rebekah Chesterfield, NP  REFERRING PROVIDER: Harle Battiest, PA-C   REFERRING DIAG: Status post insertion of spinal cord stimulator; Spinal stenosis, lumbar region with neurogenic claudication   Rationale for Evaluation and Treatment: Rehabilitation  THERAPY DIAG:  Muscle weakness (generalized)  Unsteadiness on feet  ONSET DATE: 1964  SUBJECTIVE:  SUBJECTIVE STATEMENT:  Patient reports that he feels good today.   PERTINENT HISTORY:  History of a MI, hypertension, atrial fibrillation, diabetes, CKD, pacemaker, arthritis, and chronic left shoulder pain  PAIN:  Are you having pain? Yes: NPRS scale: no number given/10 Pain location: low back  and bil knee Pain description: "grabbing"  Aggravating factors: walking Relieving factors: turning up his stimulator  PRECAUTIONS: ICD/Pacemaker  WEIGHT BEARING RESTRICTIONS: No  FALLS:  Has patient fallen in last 6 months? No  LIVING ENVIRONMENT: Lives with: lives with their spouse Lives in: House/apartment Stairs: No Has following equipment at home: Single point cane and Ramped entry  OCCUPATION: retired  PLOF: Independent  PATIENT GOALS: improved strength and balance  NEXT MD VISIT: 05/11/23  OBJECTIVE:   SCREENING FOR RED FLAGS: Bowel or bladder incontinence: No Spinal tumors: No Cauda equina syndrome: No Compression fracture: No Abdominal aneurysm: No  COGNITION: Overall cognitive status: Within functional limits for tasks assessed     SENSATION: Light touch: Impaired  and diminished sensation at right lateral knee with no dermatomal pattern. Patient reports that he has neuropathy in both feet  POSTURE: forward head, decreased lumbar lordosis, and increased thoracic kyphosis  LOWER  EXTREMITY ROM: WFL for activities assessed  LOWER EXTREMITY MMT:    MMT Right eval Left eval  Hip flexion 3+/5 3+/5  Hip extension    Hip abduction    Hip adduction    Hip internal rotation    Hip external rotation    Knee flexion 3+/5 4-/5  Knee extension 4-/5 3+/5; knee pain  Ankle dorsiflexion 3+/5 3+/5  Ankle plantarflexion    Ankle inversion    Ankle eversion     (Blank rows = not tested)  FUNCTIONAL TESTS:  5 times sit to stand: 36.99 seconds with UE support from armrests Timed up and go (TUG): 18.28 seconds with SPC   BALANCE:  Narrow BOS, firm surface, eyes open: 30 seconds  Narrow BOS, firm surface, eyes closed: 3 seconds  Semi-tandem, firm surface, eyes open, left leading: 25 seconds  Semi-tandem, firm surface, eyes open, right leading: 30 seconds  GAIT: Assistive device utilized: Single point cane Level of assistance: Modified independence Comments: decreased stride length and gait speed with poor foot clearance bilaterally  TODAY'S TREATMENT:                                                                                                                              DATE:                                                 03/28/23 EXERCISE LOG  Exercise Repetitions and Resistance Comments  Nustep  L3-4 x 15 minutes   LAQ 4# x 3 x 10 reps each  Alternating LE  Seated marching  4# x 3  x 10 reps each  Alternating LE   Standing hip ABD  2 x 10 reps each  BUE support   Standing HS curl  2 x 10 rep each  BUE support   Seated hip ADD isometric  2 x 10 reps w/ 5 second hold   Resisted pull down (seated) Red t-band x 10 reps; green t-band 2 x 10 reps  For lumbar stability   Seated clams  Green t-band x 2 x 10 reps     Blank cell = exercise not performed today                                    03/23/23 EXERCISE LOG  Exercise Repetitions and Resistance Comments  Nustep L3 x 15 minutes   LAQ 3x10 reps each  3# Bil   Seated HS curl  Green x25 Bil   Seated marching   3# 3x10 reps   alternating   Standed rocker board     Resisted clam Green t-band 3x10  Bil    Sit to stand  X10   STS    better today Limited by left knee pain  Seated Hip ADD ball squeeze 3x10 hold 5 secs      Blank cell = exercise not performed today    PATIENT EDUCATION:  Education details: POC, objective findings, prognosis, and goals for therapy Person educated: Patient Education method: Explanation Education comprehension: verbalized understanding  HOME EXERCISE PROGRAM:   ASSESSMENT:  CLINICAL IMPRESSION:  Patient was progressed with standing hip abduction and extension for improved hip abductor and posterior chain engagement, respectively. He required minimal cueing with today's new interventions for proper biomechanics to prevent compensatory movement patterns. He reported increase fatigue with today's interventions, but this did not limit his ability to complete these interventions. However, he did require seated rest breaks throughout treatment. He reported feeling "pretty good" upon the conclusion of treatment. He continues to require skilled physical therapy to address his remaining impairments to maximize his functional mobility.   OBJECTIVE IMPAIRMENTS: Abnormal gait, decreased activity tolerance, decreased balance, decreased mobility, difficulty walking, decreased strength, impaired sensation, postural dysfunction, and pain.   ACTIVITY LIMITATIONS: lifting, standing, stairs, transfers, and locomotion level  PARTICIPATION LIMITATIONS: shopping and community activity  PERSONAL FACTORS: Age, Time since onset of injury/illness/exacerbation, and 3+ comorbidities: History of a MI, hypertension, atrial fibrillation, diabetes, CKD, pacemaker, arthritis, and chronic left shoulder pain  are also affecting patient's functional outcome.   REHAB POTENTIAL: Fair    CLINICAL DECISION MAKING: Evolving/moderate complexity  EVALUATION COMPLEXITY: Moderate   GOALS: Goals reviewed  with patient? Yes  SHORT TERM GOALS: Target date: 03/21/23  Patient will be independent with his initial HEP. Baseline: Goal status: MET  2.  Patient will improve his 5 times sit to stand time to 30 seconds or less for improved lower extremity power. Baseline: 7/22: 20.04 Goal status: MET  3.  Patient will improve his timed up and go to 15 seconds or less for improved mobility. Baseline: 7/22: 15.3 seconds Goal status: MET  LONG TERM GOALS: Target date: 04/11/23  Patient will be independent with his advanced HEP. Baseline:  Goal status: IN PROGRESS  2.  Patient will improve his 5 times sit to stand time to 20 seconds or less for improved lower extremity power. Baseline: 7/22: 20.04 seconds Goal status: IN PROGRESS  3.  Patient will be able to transfer from sitting to  standing without upper extremity support for improved independence. Baseline:  Goal status: IN PROGRESS  4.  Patient will improve his timed up and go to 12 seconds or less for reduced fall risk. Baseline: 7/22: 15.3 seconds Goal status: IN PROGRESS  PLAN:  PT FREQUENCY: 2x/week  PT DURATION: 6 weeks  PLANNED INTERVENTIONS: Therapeutic exercises, Therapeutic activity, Neuromuscular re-education, Balance training, Gait training, Patient/Family education, Self Care, Joint mobilization, Spinal mobilization, Cryotherapy, Moist heat, Manual therapy, and Re-evaluation.  PLAN FOR NEXT SESSION: nustep, lower extremity strengthening, and balance interventions   Granville Lewis, PT 03/28/2023, 12:12 PM

## 2023-03-30 ENCOUNTER — Ambulatory Visit: Payer: Medicare HMO

## 2023-03-30 ENCOUNTER — Ambulatory Visit: Payer: Medicare HMO | Attending: Physician Assistant

## 2023-03-30 DIAGNOSIS — R2681 Unsteadiness on feet: Secondary | ICD-10-CM | POA: Insufficient documentation

## 2023-03-30 DIAGNOSIS — M6281 Muscle weakness (generalized): Secondary | ICD-10-CM | POA: Insufficient documentation

## 2023-03-30 NOTE — Therapy (Signed)
OUTPATIENT PHYSICAL THERAPY THORACOLUMBAR TREATMENT   Patient Name: Wayne Gordon MRN: [id number] DOB:1933-09-10, 89 y.o., male Today's Date: 03/30/2023  END OF SESSION:  PT End of Session - 03/30/23 1352     Visit Number 9    Number of Visits 12    Date for PT Re-Evaluation 05/26/23    PT Start Time 1345    PT Stop Time 1430    PT Time Calculation (min) 45 min    Activity Tolerance Patient tolerated treatment well    Behavior During Therapy [facility] for tasks assessed/performed               Past Medical History:  Diagnosis Date   Allergy to ACE inhibitors    Angioedema many years ago; patient has tolerated Losartan (ARB) in the past - it was held during [facility] for GI bleed and worsening renal function >> resume Losartan 25 mg QD 06/2015   Arthritis    Atrial fibrillation (HCC)    PAF, CHADs2Vasc = 5   Carotid artery disease (HCC)    60-79% LICA   CHF (congestive heart failure) (HCC)    CKD (chronic kidney disease)    Coronary artery disease    Diabetes mellitus    GERD (gastroesophageal reflux disease)    Hyperlipidemia    Hypertension    ICD (implantable cardiac defibrillator) in place 03/28/2012   [place], Dr. Ladona Ridgel 03/28/12   Ischemic cardiomyopathy    S/P CABG x 3; EF 25%   Mitral regurgitation    S/P mitral valve repair 2013   Myocardial infarction [facility])    Renal insufficiency    Past Surgical History:  Procedure Laterality Date   BIV UPGRADE N/A 10/12/2020   Procedure: BIV ICD UPGRADE;  Surgeon: Marinus Maw, MD;  Location: [facility] INVASIVE CV LAB;  Service: Cardiovascular;  Laterality: N/A;   CHOLECYSTECTOMY N/A 09/20/2017   Procedure: LAPAROSCOPIC SUBTOTAL CHOLECYSTECTOMY;  Surgeon: Axel Filler, MD;  Location: [facility] OR;  Service: General;  Laterality: N/A;   COLONOSCOPY N/A 04/24/2014   RMR Pancolonic diverticulosis   CORONARY ARTERY BYPASS GRAFT  10/17/2011   Procedure: CORONARY ARTERY BYPASS GRAFTING (CABG);  Surgeon: Purcell Nails, MD;  Location:  [facility] OR;  Service: Open Heart Surgery;  Laterality: N/A;   ERCP N/A 09/29/2017   Procedure: ENDOSCOPIC RETROGRADE CHOLANGIOPANCREATOGRAPHY (ERCP);  Surgeon: Jeani Hawking, MD;  Location: [facility] ENDOSCOPY;  Service: Endoscopy;  Laterality: N/A;   ESOPHAGOGASTRODUODENOSCOPY N/A 04/24/2014   RMR Subtle nodularity the gastric mucosa of uncertain significance-status post biopsy. Hiatal hernia. chronic inflammation, no H.pylori   ESOPHAGOGASTRODUODENOSCOPY N/A 06/24/2016   Dr. Jena Gauss: normal esophagus, small hiatal hernia, normal duodenum   ICD  03/28/2012   IMPLANTABLE CARDIOVERTER DEFIBRILLATOR IMPLANT N/A 03/28/2012   Procedure: IMPLANTABLE CARDIOVERTER DEFIBRILLATOR IMPLANT;  Surgeon: Marinus Maw, MD;  Location: [facility] CATH LAB;  Service: Cardiovascular;  Laterality: N/A;   KNEE ARTHROSCOPY  ~ 2008   right   LEFT AND RIGHT HEART CATHETERIZATION WITH CORONARY ANGIOGRAM N/A 10/12/2011   Procedure: LEFT AND RIGHT HEART CATHETERIZATION WITH CORONARY ANGIOGRAM;  Surgeon: Iran Ouch, MD;  Location: [facility] CATH LAB;  Service: Cardiovascular;  Laterality: N/A;   MITRAL VALVE REPAIR  10/17/2011   Procedure: MITRAL VALVE REPAIR (MVR);  Surgeon: Purcell Nails, MD;  Location: [facility] OR;  Service: Open Heart Surgery;  Laterality: N/A;   NASAL SINUS SURGERY  1990's   right   Patient Active Problem List   Diagnosis Date Noted   Human metapneumovirus pneumonia 10/16/2021  Acute on chronic systolic CHF (congestive heart failure) (HCC) 10/16/2021   CHF exacerbation (HCC) 10/15/2021   CAD (coronary artery disease) of artery bypass graft 10/15/2021   History of implantable cardioverter-defibrillator (ICD) placement 10/15/2021   Benign essential HTN 10/15/2021   Chronic kidney disease, stage 3b (HCC) 10/15/2021   Acute respiratory failure with hypoxia (HCC) 10/14/2021   Bloating 02/27/2020   Nausea with vomiting 02/27/2020   Abdominal pain 02/27/2020   Iron deficiency anemia 02/08/2018   Stage III chronic kidney disease  (HCC) 02/08/2018   Diabetes mellitus (HCC) 02/08/2018   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   Bacteremia due to Klebsiella pneumoniae 09/22/2017   Sepsis due to Klebsiella pneumoniae (HCC) 09/22/2017   Cholangitis 09/17/2017   Leukocytosis 09/17/2017   Cholelithiasis 09/17/2017   Elevated liver enzymes 09/17/2017   Acute renal failure superimposed on stage 3 chronic kidney disease (HCC) 09/17/2017   Low blood pressure reading 09/17/2017   Lactic acidosis 09/17/2017   Elevated troponin 09/17/2017   Nausea without vomiting 01/10/2017   Gastrointestinal hemorrhage associated with intestinal diverticulosis 06/23/2016   Carotid artery disease (HCC)    Syncope and collapse 07/11/2015   Ventricular arrhythmia 07/10/2015   Complete heart block (HCC)    ICD (implantable cardioverter-defibrillator) discharge    Atrial fibrillation (HCC) 06/04/2015   Diabetes (HCC) 06/02/2015   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/02/2015   GERD (gastroesophageal reflux disease) 02/18/2015   Abdominal pain, epigastric 02/18/2015   Pleural effusion 09/01/2014   Rectal bleeding 03/25/2014   Acute blood loss anemia 03/25/2014   Adult body mass index 28.0-28.9 09/30/2013   Diabetes mellitus, type 2 (HCC) 08/09/2013   Type 2 diabetes mellitus without complication (HCC) 08/09/2013   PVC's (premature ventricular contractions) 07/04/2013   Hyperlipidemia 01/03/2013   Automatic implantable cardioverter-defibrillator in situ 03/30/2012   S/P MVR (mitral valve repair) 01/16/2012   HTN (hypertension) 11/28/2011   Chronic systolic heart failure (HCC) 10/31/2011   Ischemic cardiomyopathy 10/31/2011   CKD (chronic kidney disease) stage 3, GFR 30-59 ml/min (HCC) 10/31/2011   Current use of long term anticoagulation 10/31/2011   Long term (current) use of anticoagulants 10/28/2011   S/P mitral valve repair 10/17/2011   S/P CABG x 3 10/17/2011   Mitral regurgitation 10/12/2011   NSTEMI (non-ST elevated myocardial  infarction) (HCC) 10/11/2011    PCP: Rebekah Chesterfield, NP  REFERRING PROVIDER: Harle Battiest, PA-C   REFERRING DIAG: Status post insertion of spinal cord stimulator; Spinal stenosis, lumbar region with neurogenic claudication   Rationale for Evaluation and Treatment: Rehabilitation  THERAPY DIAG:  Muscle weakness (generalized)  Unsteadiness on feet  ONSET DATE: 1964  SUBJECTIVE:  SUBJECTIVE STATEMENT:  Patient reports that he feeling okay today.  Pt reports knee and back pain.   PERTINENT HISTORY:  History of a MI, hypertension, atrial fibrillation, diabetes, CKD, pacemaker, arthritis, and chronic left shoulder pain  PAIN:  Are you having pain? Yes: NPRS scale: 2/10 Pain location: low back  and bil knee Pain description: "grabbing"  Aggravating factors: walking Relieving factors: turning up his stimulator  PRECAUTIONS: ICD/Pacemaker  WEIGHT BEARING RESTRICTIONS: No  FALLS:  Has patient fallen in last 6 months? No  LIVING ENVIRONMENT: Lives with: lives with their spouse Lives in: House/apartment Stairs: No Has following equipment at home: Single point cane and Ramped entry  OCCUPATION: retired  PLOF: Independent  PATIENT GOALS: improved strength and balance  NEXT MD VISIT: 05/11/23  OBJECTIVE:   SCREENING FOR RED FLAGS: Bowel or bladder incontinence: No Spinal tumors: No Cauda equina syndrome: No Compression fracture: No Abdominal aneurysm: No  COGNITION: Overall cognitive status: Within functional limits for tasks assessed     SENSATION: Light touch: Impaired  and diminished sensation at right lateral knee with no dermatomal pattern. Patient reports that he has neuropathy in both feet  POSTURE: forward head, decreased lumbar lordosis, and increased thoracic  kyphosis  LOWER EXTREMITY ROM: WFL for activities assessed  LOWER EXTREMITY MMT:    MMT Right eval Left eval  Hip flexion 3+/5 3+/5  Hip extension    Hip abduction    Hip adduction    Hip internal rotation    Hip external rotation    Knee flexion 3+/5 4-/5  Knee extension 4-/5 3+/5; knee pain  Ankle dorsiflexion 3+/5 3+/5  Ankle plantarflexion    Ankle inversion    Ankle eversion     (Blank rows = not tested)  FUNCTIONAL TESTS:  5 times sit to stand: 36.99 seconds with UE support from armrests Timed up and go (TUG): 18.28 seconds with SPC   BALANCE:  Narrow BOS, firm surface, eyes open: 30 seconds  Narrow BOS, firm surface, eyes closed: 3 seconds  Semi-tandem, firm surface, eyes open, left leading: 25 seconds  Semi-tandem, firm surface, eyes open, right leading: 30 seconds  GAIT: Assistive device utilized: Single point cane Level of assistance: Modified independence Comments: decreased stride length and gait speed with poor foot clearance bilaterally  TODAY'S TREATMENT:                                                                                                                              DATE:           03/30/23 EXERCISE LOG  Exercise Repetitions and Resistance Comments  Nustep  L3-4 x 15 minutes   LAQ 5# x 2 x 10 reps each  Alternating LE  Seated marching  5# x 2 x 10 reps each  Alternating LE   Standing hip ABD  2 x 10 reps each  BUE support   Standing HS curl  2 x 10 reps each  BUE support   Rockerboard 3 mins   Seated hip ADD isometric  2 x 10 reps w/ 5 second hold   Resisted pull down (seated) Green t-band 3 x 10 reps  For lumbar stability   Seated clams  Green t-band x 2 x 10 reps     Blank cell = exercise not performed today                                   PATIENT EDUCATION:  Education details: POC, objective findings, prognosis, and goals for therapy Person educated: Patient Education method: Explanation Education comprehension: verbalized  understanding  HOME EXERCISE PROGRAM:   ASSESSMENT:  CLINICAL IMPRESSION:  Pt arrives for today's treatment session reporting 2/10 bil knee and low back pain.  Pt able to tolerate increased resistance with seated exercises today with LAQ and seated marches.  Pt given seated rest breaks as needed due to fatigue.  Pt reports having two other doctors appointments today and being more fatigued that usual.  Pt denied any pain at completion of today's treatment session.   OBJECTIVE IMPAIRMENTS: Abnormal gait, decreased activity tolerance, decreased balance, decreased mobility, difficulty walking, decreased strength, impaired sensation, postural dysfunction, and pain.   ACTIVITY LIMITATIONS: lifting, standing, stairs, transfers, and locomotion level  PARTICIPATION LIMITATIONS: shopping and community activity  PERSONAL FACTORS: Age, Time since onset of injury/illness/exacerbation, and 3+ comorbidities: History of a MI, hypertension, atrial fibrillation, diabetes, CKD, pacemaker, arthritis, and chronic left shoulder pain  are also affecting patient's functional outcome.   REHAB POTENTIAL: Fair    CLINICAL DECISION MAKING: Evolving/moderate complexity  EVALUATION COMPLEXITY: Moderate   GOALS: Goals reviewed with patient? Yes  SHORT TERM GOALS: Target date: 03/21/23  Patient will be independent with his initial HEP. Baseline: Goal status: MET  2.  Patient will improve his 5 times sit to stand time to 30 seconds or less for improved lower extremity power. Baseline: 7/22: 20.04 Goal status: MET  3.  Patient will improve his timed up and go to 15 seconds or less for improved mobility. Baseline: 7/22: 15.3 seconds Goal status: MET  LONG TERM GOALS: Target date: 04/11/23  Patient will be independent with his advanced HEP. Baseline:  Goal status: IN PROGRESS  2.  Patient will improve his 5 times sit to stand time to 20 seconds or less for improved lower extremity power. Baseline: 7/22:  20.04 seconds Goal status: IN PROGRESS  3.  Patient will be able to transfer from sitting to standing without upper extremity support for improved independence. Baseline:  Goal status: IN PROGRESS  4.  Patient will improve his timed up and go to 12 seconds or less for reduced fall risk. Baseline: 7/22: 15.3 seconds Goal status: IN PROGRESS  PLAN:  PT FREQUENCY: 2x/week  PT DURATION: 6 weeks  PLANNED INTERVENTIONS: Therapeutic exercises, Therapeutic activity, Neuromuscular re-education, Balance training, Gait training, Patient/Family education, Self Care, Joint mobilization, Spinal mobilization, Cryotherapy, Moist heat, Manual therapy, and Re-evaluation.  PLAN FOR NEXT SESSION: nustep, lower extremity strengthening, and balance interventions   Newman Pies, PTA 03/30/2023, 2:56 PM

## 2023-04-04 ENCOUNTER — Ambulatory Visit: Payer: Medicare HMO

## 2023-04-04 DIAGNOSIS — M6281 Muscle weakness (generalized): Secondary | ICD-10-CM | POA: Diagnosis not present

## 2023-04-04 DIAGNOSIS — R2681 Unsteadiness on feet: Secondary | ICD-10-CM

## 2023-04-04 NOTE — Therapy (Addendum)
OUTPATIENT PHYSICAL THERAPY THORACOLUMBAR TREATMENT   Patient Name: Wayne Gordon MRN: [id number] DOB:1933-08-31, 89 y.o., male Today's Date: 04/04/2023  END OF SESSION:  PT End of Session - 04/04/23 1519     Visit Number 10    Number of Visits 12    Date for PT Re-Evaluation 05/26/23    PT Start Time 1515    PT Stop Time 1555    PT Time Calculation (min) 40 min    Activity Tolerance Patient tolerated treatment well    Behavior During Therapy [facility] for tasks assessed/performed               Past Medical History:  Diagnosis Date   Allergy to ACE inhibitors    Angioedema many years ago; patient has tolerated Losartan (ARB) in the past - it was held during [facility] for GI bleed and worsening renal function >> resume Losartan 25 mg QD 06/2015   Arthritis    Atrial fibrillation (HCC)    PAF, CHADs2Vasc = 5   Carotid artery disease (HCC)    60-79% LICA   CHF (congestive heart failure) (HCC)    CKD (chronic kidney disease)    Coronary artery disease    Diabetes mellitus    GERD (gastroesophageal reflux disease)    Hyperlipidemia    Hypertension    ICD (implantable cardiac defibrillator) in place 03/28/2012   [place], Dr. Ladona Ridgel 03/28/12   Ischemic cardiomyopathy    S/P CABG x 3; EF 25%   Mitral regurgitation    S/P mitral valve repair 2013   Myocardial infarction [facility])    Renal insufficiency    Past Surgical History:  Procedure Laterality Date   BIV UPGRADE N/A 10/12/2020   Procedure: BIV ICD UPGRADE;  Surgeon: Marinus Maw, MD;  Location: [facility] INVASIVE CV LAB;  Service: Cardiovascular;  Laterality: N/A;   CHOLECYSTECTOMY N/A 09/20/2017   Procedure: LAPAROSCOPIC SUBTOTAL CHOLECYSTECTOMY;  Surgeon: Axel Filler, MD;  Location: [facility] OR;  Service: General;  Laterality: N/A;   COLONOSCOPY N/A 04/24/2014   RMR Pancolonic diverticulosis   CORONARY ARTERY BYPASS GRAFT  10/17/2011   Procedure: CORONARY ARTERY BYPASS GRAFTING (CABG);  Surgeon: Purcell Nails, MD;  Location:  [facility] OR;  Service: Open Heart Surgery;  Laterality: N/A;   ERCP N/A 09/29/2017   Procedure: ENDOSCOPIC RETROGRADE CHOLANGIOPANCREATOGRAPHY (ERCP);  Surgeon: Jeani Hawking, MD;  Location: [facility] ENDOSCOPY;  Service: Endoscopy;  Laterality: N/A;   ESOPHAGOGASTRODUODENOSCOPY N/A 04/24/2014   RMR Subtle nodularity the gastric mucosa of uncertain significance-status post biopsy. Hiatal hernia. chronic inflammation, no H.pylori   ESOPHAGOGASTRODUODENOSCOPY N/A 06/24/2016   Dr. Jena Gauss: normal esophagus, small hiatal hernia, normal duodenum   ICD  03/28/2012   IMPLANTABLE CARDIOVERTER DEFIBRILLATOR IMPLANT N/A 03/28/2012   Procedure: IMPLANTABLE CARDIOVERTER DEFIBRILLATOR IMPLANT;  Surgeon: Marinus Maw, MD;  Location: [facility] CATH LAB;  Service: Cardiovascular;  Laterality: N/A;   KNEE ARTHROSCOPY  ~ 2008   right   LEFT AND RIGHT HEART CATHETERIZATION WITH CORONARY ANGIOGRAM N/A 10/12/2011   Procedure: LEFT AND RIGHT HEART CATHETERIZATION WITH CORONARY ANGIOGRAM;  Surgeon: Iran Ouch, MD;  Location: [facility] CATH LAB;  Service: Cardiovascular;  Laterality: N/A;   MITRAL VALVE REPAIR  10/17/2011   Procedure: MITRAL VALVE REPAIR (MVR);  Surgeon: Purcell Nails, MD;  Location: [facility] OR;  Service: Open Heart Surgery;  Laterality: N/A;   NASAL SINUS SURGERY  1990's   right   Patient Active Problem List   Diagnosis Date Noted   Human metapneumovirus pneumonia 10/16/2021  Acute on chronic systolic CHF (congestive heart failure) (HCC) 10/16/2021   CHF exacerbation (HCC) 10/15/2021   CAD (coronary artery disease) of artery bypass graft 10/15/2021   History of implantable cardioverter-defibrillator (ICD) placement 10/15/2021   Benign essential HTN 10/15/2021   Chronic kidney disease, stage 3b (HCC) 10/15/2021   Acute respiratory failure with hypoxia (HCC) 10/14/2021   Bloating 02/27/2020   Nausea with vomiting 02/27/2020   Abdominal pain 02/27/2020   Iron deficiency anemia 02/08/2018   Stage III chronic kidney disease  (HCC) 02/08/2018   Diabetes mellitus (HCC) 02/08/2018   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   Bacteremia due to Klebsiella pneumoniae 09/22/2017   Sepsis due to Klebsiella pneumoniae (HCC) 09/22/2017   Cholangitis 09/17/2017   Leukocytosis 09/17/2017   Cholelithiasis 09/17/2017   Elevated liver enzymes 09/17/2017   Acute renal failure superimposed on stage 3 chronic kidney disease (HCC) 09/17/2017   Low blood pressure reading 09/17/2017   Lactic acidosis 09/17/2017   Elevated troponin 09/17/2017   Nausea without vomiting 01/10/2017   Gastrointestinal hemorrhage associated with intestinal diverticulosis 06/23/2016   Carotid artery disease (HCC)    Syncope and collapse 07/11/2015   Ventricular arrhythmia 07/10/2015   Complete heart block (HCC)    ICD (implantable cardioverter-defibrillator) discharge    Atrial fibrillation (HCC) 06/04/2015   Diabetes (HCC) 06/02/2015   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/02/2015   GERD (gastroesophageal reflux disease) 02/18/2015   Abdominal pain, epigastric 02/18/2015   Pleural effusion 09/01/2014   Rectal bleeding 03/25/2014   Acute blood loss anemia 03/25/2014   Adult body mass index 28.0-28.9 09/30/2013   Diabetes mellitus, type 2 (HCC) 08/09/2013   Type 2 diabetes mellitus without complication (HCC) 08/09/2013   PVC's (premature ventricular contractions) 07/04/2013   Hyperlipidemia 01/03/2013   Automatic implantable cardioverter-defibrillator in situ 03/30/2012   S/P MVR (mitral valve repair) 01/16/2012   HTN (hypertension) 11/28/2011   Chronic systolic heart failure (HCC) 10/31/2011   Ischemic cardiomyopathy 10/31/2011   CKD (chronic kidney disease) stage 3, GFR 30-59 ml/min (HCC) 10/31/2011   Current use of long term anticoagulation 10/31/2011   Long term (current) use of anticoagulants 10/28/2011   S/P mitral valve repair 10/17/2011   S/P CABG x 3 10/17/2011   Mitral regurgitation 10/12/2011   NSTEMI (non-ST elevated myocardial  infarction) (HCC) 10/11/2011    PCP: Rebekah Chesterfield, NP  REFERRING PROVIDER: Harle Battiest, PA-C   REFERRING DIAG: Status post insertion of spinal cord stimulator; Spinal stenosis, lumbar region with neurogenic claudication   Rationale for Evaluation and Treatment: Rehabilitation  THERAPY DIAG:  Muscle weakness (generalized)  Unsteadiness on feet  ONSET DATE: 1964  SUBJECTIVE:  SUBJECTIVE STATEMENT:  Pt denies any pain today.  Will discharge at the end of next week.   PERTINENT HISTORY:  History of a MI, hypertension, atrial fibrillation, diabetes, CKD, pacemaker, arthritis, and chronic left shoulder pain  PAIN:  Are you having pain? No  PRECAUTIONS: ICD/Pacemaker  WEIGHT BEARING RESTRICTIONS: No  FALLS:  Has patient fallen in last 6 months? No  LIVING ENVIRONMENT: Lives with: lives with their spouse Lives in: House/apartment Stairs: No Has following equipment at home: Single point cane and Ramped entry  OCCUPATION: retired  PLOF: Independent  PATIENT GOALS: improved strength and balance  NEXT MD VISIT: 05/11/23  OBJECTIVE:   SCREENING FOR RED FLAGS: Bowel or bladder incontinence: No Spinal tumors: No Cauda equina syndrome: No Compression fracture: No Abdominal aneurysm: No  COGNITION: Overall cognitive status: Within functional limits for tasks assessed     SENSATION: Light touch: Impaired  and diminished sensation at right lateral knee with no dermatomal pattern. Patient reports that he has neuropathy in both feet  POSTURE: forward head, decreased lumbar lordosis, and increased thoracic kyphosis  LOWER EXTREMITY ROM: WFL for activities assessed  LOWER EXTREMITY MMT:    MMT Right eval Left eval  Hip flexion 3+/5 3+/5  Hip extension    Hip abduction     Hip adduction    Hip internal rotation    Hip external rotation    Knee flexion 3+/5 4-/5  Knee extension 4-/5 3+/5; knee pain  Ankle dorsiflexion 3+/5 3+/5  Ankle plantarflexion    Ankle inversion    Ankle eversion     (Blank rows = not tested)  FUNCTIONAL TESTS:  5 times sit to stand: 36.99 seconds with UE support from armrests Timed up and go (TUG): 18.28 seconds with SPC   BALANCE:  Narrow BOS, firm surface, eyes open: 30 seconds  Narrow BOS, firm surface, eyes closed: 3 seconds  Semi-tandem, firm surface, eyes open, left leading: 25 seconds  Semi-tandem, firm surface, eyes open, right leading: 30 seconds  GAIT: Assistive device utilized: Single point cane Level of assistance: Modified independence Comments: decreased stride length and gait speed with poor foot clearance bilaterally  TODAY'S TREATMENT:                                                                                                                              DATE:           04/04/23 EXERCISE LOG  Exercise Repetitions and Resistance Comments  Nustep  L3-4 x 15 minutes   LAQ 5# x 20 reps each  Alternating LE  Seated marching  5# x 20 reps reps each  Alternating LE   Seated hip ABD  Green x20 reps BUE support   Standing HS curl  Green 2 x 10 reps each  BUE support   Rockerboard    Seated hip ADD isometric  2 x 10 reps w/ 5 second hold   Resisted pull down (seated)  For  lumbar stability        Blank cell = exercise not performed today                                   PATIENT EDUCATION:  Education details: POC, objective findings, prognosis, and goals for therapy Person educated: Patient Education method: Explanation Education comprehension: verbalized understanding  HOME EXERCISE PROGRAM:   ASSESSMENT:  CLINICAL IMPRESSION:  Pt arrives for today's treatment session denying any pain.  Pt able to perform 5 STS test in 12.2 seconds and TUG in 15.8 seconds meeting both of his long term goals.  Pt  very anxious about the bad weather coming in throughout treatment session.  Pt continues to be challenged with performing STS transfers without use of BUEs.  Pt denied any pain at completion of today's treatment session.  04/04/23 PROGRESS REPORT: Patient is making excellent progress with skilled physical therapy as evidenced by his subjective reports, objective measures, functional mobility, and progress toward his goals. He was able to meet most of his goals for skilled physical therapy. However, he continues to require upper extremity support with sit to stand transfers. Recommend that he continue with his current plan of care to address his remaining impairments to return to his prior level of function.   Candi Leash, PT, DPT   OBJECTIVE IMPAIRMENTS: Abnormal gait, decreased activity tolerance, decreased balance, decreased mobility, difficulty walking, decreased strength, impaired sensation, postural dysfunction, and pain.   ACTIVITY LIMITATIONS: lifting, standing, stairs, transfers, and locomotion level  PARTICIPATION LIMITATIONS: shopping and community activity  PERSONAL FACTORS: Age, Time since onset of injury/illness/exacerbation, and 3+ comorbidities: History of a MI, hypertension, atrial fibrillation, diabetes, CKD, pacemaker, arthritis, and chronic left shoulder pain  are also affecting patient's functional outcome.   REHAB POTENTIAL: Fair    CLINICAL DECISION MAKING: Evolving/moderate complexity  EVALUATION COMPLEXITY: Moderate   GOALS: Goals reviewed with patient? Yes  SHORT TERM GOALS: Target date: 03/21/23  Patient will be independent with his initial HEP. Baseline: Goal status: MET  2.  Patient will improve his 5 times sit to stand time to 30 seconds or less for improved lower extremity power. Baseline: 7/22: 20.04 Goal status: MET  3.  Patient will improve his timed up and go to 15 seconds or less for improved mobility. Baseline: 7/22: 15.3 seconds Goal status:  MET  LONG TERM GOALS: Target date: 04/11/23  Patient will be independent with his advanced HEP. Baseline:  Goal status: IN PROGRESS  2.  Patient will improve his 5 times sit to stand time to 20 seconds or less for improved lower extremity power. Baseline: 7/22: 20.04 seconds 8/6: 15.8 seconds Goal status: MET  3.  Patient will be able to transfer from sitting to standing without upper extremity support for improved independence. Baseline:  Goal status: IN PROGRESS  4.  Patient will improve his timed up and go to 12 seconds or less for reduced fall risk. Baseline: 7/22: 15.3 seconds 8/6: 12.2 seconds Goal status: MET  PLAN:  PT FREQUENCY: 2x/week  PT DURATION: 6 weeks  PLANNED INTERVENTIONS: Therapeutic exercises, Therapeutic activity, Neuromuscular re-education, Balance training, Gait training, Patient/Family education, Self Care, Joint mobilization, Spinal mobilization, Cryotherapy, Moist heat, Manual therapy, and Re-evaluation.  PLAN FOR NEXT SESSION: nustep, lower extremity strengthening, and balance interventions   Newman Pies, PTA 04/04/2023, 3:57 PM

## 2023-04-11 ENCOUNTER — Ambulatory Visit: Payer: Medicare HMO | Admitting: Physical Therapy

## 2023-04-13 ENCOUNTER — Encounter: Payer: Medicare HMO | Admitting: Physical Therapy

## 2023-04-18 ENCOUNTER — Ambulatory Visit: Payer: Medicare HMO

## 2023-04-18 DIAGNOSIS — M6281 Muscle weakness (generalized): Secondary | ICD-10-CM

## 2023-04-18 DIAGNOSIS — R2681 Unsteadiness on feet: Secondary | ICD-10-CM

## 2023-04-18 NOTE — Therapy (Signed)
OUTPATIENT PHYSICAL THERAPY THORACOLUMBAR TREATMENT   Patient Name: Wayne Gordon MRN: [id number] DOB:Sep 18, 1933, 89 y.o., male Today's Date: 04/18/2023  END OF SESSION:  PT End of Session - 04/18/23 1342     Visit Number 11    Number of Visits 12    Date for PT Re-Evaluation 05/26/23    PT Start Time 1345    PT Stop Time 1427    PT Time Calculation (min) 42 min    Activity Tolerance Patient tolerated treatment well    Behavior During Therapy [facility] for tasks assessed/performed                Past Medical History:  Diagnosis Date   Allergy to ACE inhibitors    Angioedema many years ago; patient has tolerated Losartan (ARB) in the past - it was held during [facility] for GI bleed and worsening renal function >> resume Losartan 25 mg QD 06/2015   Arthritis    Atrial fibrillation (HCC)    PAF, CHADs2Vasc = 5   Carotid artery disease (HCC)    60-79% LICA   CHF (congestive heart failure) (HCC)    CKD (chronic kidney disease)    Coronary artery disease    Diabetes mellitus    GERD (gastroesophageal reflux disease)    Hyperlipidemia    Hypertension    ICD (implantable cardiac defibrillator) in place 03/28/2012   Biotronik, Dr. Ladona Ridgel 03/28/12   Ischemic cardiomyopathy    S/P CABG x 3; EF 25%   Mitral regurgitation    S/P mitral valve repair 2013   Myocardial infarction [facility])    Renal insufficiency    Past Surgical History:  Procedure Laterality Date   BIV UPGRADE N/A 10/12/2020   Procedure: BIV ICD UPGRADE;  Surgeon: Marinus Maw, MD;  Location: [facility] INVASIVE CV LAB;  Service: Cardiovascular;  Laterality: N/A;   CHOLECYSTECTOMY N/A 09/20/2017   Procedure: LAPAROSCOPIC SUBTOTAL CHOLECYSTECTOMY;  Surgeon: Axel Filler, MD;  Location: [facility] OR;  Service: General;  Laterality: N/A;   COLONOSCOPY N/A 04/24/2014   RMR Pancolonic diverticulosis   CORONARY ARTERY BYPASS GRAFT  10/17/2011   Procedure: CORONARY ARTERY BYPASS GRAFTING (CABG);  Surgeon: Purcell Nails, MD;   Location: [facility] OR;  Service: Open Heart Surgery;  Laterality: N/A;   ERCP N/A 09/29/2017   Procedure: ENDOSCOPIC RETROGRADE CHOLANGIOPANCREATOGRAPHY (ERCP);  Surgeon: Jeani Hawking, MD;  Location: [facility] ENDOSCOPY;  Service: Endoscopy;  Laterality: N/A;   ESOPHAGOGASTRODUODENOSCOPY N/A 04/24/2014   RMR Subtle nodularity the gastric mucosa of uncertain significance-status post biopsy. Hiatal hernia. chronic inflammation, no H.pylori   ESOPHAGOGASTRODUODENOSCOPY N/A 06/24/2016   Dr. Jena Gauss: normal esophagus, small hiatal hernia, normal duodenum   ICD  03/28/2012   IMPLANTABLE CARDIOVERTER DEFIBRILLATOR IMPLANT N/A 03/28/2012   Procedure: IMPLANTABLE CARDIOVERTER DEFIBRILLATOR IMPLANT;  Surgeon: Marinus Maw, MD;  Location: [facility] CATH LAB;  Service: Cardiovascular;  Laterality: N/A;   KNEE ARTHROSCOPY  ~ 2008   right   LEFT AND RIGHT HEART CATHETERIZATION WITH CORONARY ANGIOGRAM N/A 10/12/2011   Procedure: LEFT AND RIGHT HEART CATHETERIZATION WITH CORONARY ANGIOGRAM;  Surgeon: Iran Ouch, MD;  Location: [facility] CATH LAB;  Service: Cardiovascular;  Laterality: N/A;   MITRAL VALVE REPAIR  10/17/2011   Procedure: MITRAL VALVE REPAIR (MVR);  Surgeon: Purcell Nails, MD;  Location: [facility] OR;  Service: Open Heart Surgery;  Laterality: N/A;   NASAL SINUS SURGERY  1990's   right   Patient Active Problem List   Diagnosis Date Noted   Human metapneumovirus pneumonia  10/16/2021   Acute on chronic systolic CHF (congestive heart failure) (HCC) 10/16/2021   CHF exacerbation (HCC) 10/15/2021   CAD (coronary artery disease) of artery bypass graft 10/15/2021   History of implantable cardioverter-defibrillator (ICD) placement 10/15/2021   Benign essential HTN 10/15/2021   Chronic kidney disease, stage 3b (HCC) 10/15/2021   Acute respiratory failure with hypoxia (HCC) 10/14/2021   Bloating 02/27/2020   Nausea with vomiting 02/27/2020   Abdominal pain 02/27/2020   Iron deficiency anemia 02/08/2018   Stage III chronic  kidney disease (HCC) 02/08/2018   Diabetes mellitus (HCC) 02/08/2018   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   Bacteremia due to Klebsiella pneumoniae 09/22/2017   Sepsis due to Klebsiella pneumoniae (HCC) 09/22/2017   Cholangitis 09/17/2017   Leukocytosis 09/17/2017   Cholelithiasis 09/17/2017   Elevated liver enzymes 09/17/2017   Acute renal failure superimposed on stage 3 chronic kidney disease (HCC) 09/17/2017   Low blood pressure reading 09/17/2017   Lactic acidosis 09/17/2017   Elevated troponin 09/17/2017   Nausea without vomiting 01/10/2017   Gastrointestinal hemorrhage associated with intestinal diverticulosis 06/23/2016   Carotid artery disease (HCC)    Syncope and collapse 07/11/2015   Ventricular arrhythmia 07/10/2015   Complete heart block (HCC)    ICD (implantable cardioverter-defibrillator) discharge    Atrial fibrillation (HCC) 06/04/2015   Diabetes (HCC) 06/02/2015   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/02/2015   GERD (gastroesophageal reflux disease) 02/18/2015   Abdominal pain, epigastric 02/18/2015   Pleural effusion 09/01/2014   Rectal bleeding 03/25/2014   Acute blood loss anemia 03/25/2014   Adult body mass index 28.0-28.9 09/30/2013   Diabetes mellitus, type 2 (HCC) 08/09/2013   Type 2 diabetes mellitus without complication (HCC) 08/09/2013   PVC's (premature ventricular contractions) 07/04/2013   Hyperlipidemia 01/03/2013   Automatic implantable cardioverter-defibrillator in situ 03/30/2012   S/P MVR (mitral valve repair) 01/16/2012   HTN (hypertension) 11/28/2011   Chronic systolic heart failure (HCC) 10/31/2011   Ischemic cardiomyopathy 10/31/2011   CKD (chronic kidney disease) stage 3, GFR 30-59 ml/min (HCC) 10/31/2011   Current use of long term anticoagulation 10/31/2011   Long term (current) use of anticoagulants 10/28/2011   S/P mitral valve repair 10/17/2011   S/P CABG x 3 10/17/2011   Mitral regurgitation 10/12/2011   NSTEMI (non-ST elevated  myocardial infarction) (HCC) 10/11/2011    PCP: Rebekah Chesterfield, NP  REFERRING PROVIDER: Harle Battiest, PA-C   REFERRING DIAG: Status post insertion of spinal cord stimulator; Spinal stenosis, lumbar region with neurogenic claudication   Rationale for Evaluation and Treatment: Rehabilitation  THERAPY DIAG:  Muscle weakness (generalized)  Unsteadiness on feet  ONSET DATE: 1964  SUBJECTIVE:  SUBJECTIVE STATEMENT:  Patient reports that his right knee and ankle were bothering him last week and he had to cancel his appointments.   PERTINENT HISTORY:  History of a MI, hypertension, atrial fibrillation, diabetes, CKD, pacemaker, arthritis, and chronic left shoulder pain  PAIN:  Are you having pain? No  PRECAUTIONS: ICD/Pacemaker  WEIGHT BEARING RESTRICTIONS: No  FALLS:  Has patient fallen in last 6 months? No  LIVING ENVIRONMENT: Lives with: lives with their spouse Lives in: House/apartment Stairs: No Has following equipment at home: Single point cane and Ramped entry  OCCUPATION: retired  PLOF: Independent  PATIENT GOALS: improved strength and balance  NEXT MD VISIT: 05/11/23  OBJECTIVE:   SCREENING FOR RED FLAGS: Bowel or bladder incontinence: No Spinal tumors: No Cauda equina syndrome: No Compression fracture: No Abdominal aneurysm: No  COGNITION: Overall cognitive status: Within functional limits for tasks assessed     SENSATION: Light touch: Impaired  and diminished sensation at right lateral knee with no dermatomal pattern. Patient reports that he has neuropathy in both feet  POSTURE: forward head, decreased lumbar lordosis, and increased thoracic kyphosis  LOWER EXTREMITY ROM: WFL for activities assessed  LOWER EXTREMITY MMT:    MMT Right eval Left eval   Hip flexion 3+/5 3+/5  Hip extension    Hip abduction    Hip adduction    Hip internal rotation    Hip external rotation    Knee flexion 3+/5 4-/5  Knee extension 4-/5 3+/5; knee pain  Ankle dorsiflexion 3+/5 3+/5  Ankle plantarflexion    Ankle inversion    Ankle eversion     (Blank rows = not tested)  FUNCTIONAL TESTS:  5 times sit to stand: 36.99 seconds with UE support from armrests Timed up and go (TUG): 18.28 seconds with SPC   BALANCE:  Narrow BOS, firm surface, eyes open: 30 seconds  Narrow BOS, firm surface, eyes closed: 3 seconds  Semi-tandem, firm surface, eyes open, left leading: 25 seconds  Semi-tandem, firm surface, eyes open, right leading: 30 seconds  GAIT: Assistive device utilized: Single point cane Level of assistance: Modified independence Comments: decreased stride length and gait speed with poor foot clearance bilaterally  TODAY'S TREATMENT:                                                                                                                              DATE:                                   04/18/23 EXERCISE LOG  Exercise Repetitions and Resistance Comments  Nustep  L4 x 15 minutes   LAQ 5# x 3 x 10 reps each  Alternating LE   Seated marching  5# x 3 x 10 reps each  Alternating LE   Seated hip ADD isometric  2 minutes w/ 5 second hold    Standing HS curl  20 reps each  BUE support   Seated clams  Green t-band x 3 minutes    Seated hip ABD Green t-band x 20 reps each     Blank cell = exercise not performed today              04/04/23 EXERCISE LOG  Exercise Repetitions and Resistance Comments  Nustep  L3-4 x 15 minutes   LAQ 5# x 20 reps each  Alternating LE  Seated marching  5# x 20 reps reps each  Alternating LE   Seated hip ABD  Green x20 reps BUE support   Standing HS curl  Green 2 x 10 reps each  BUE support   Rockerboard    Seated hip ADD isometric  2 x 10 reps w/ 5 second hold   Resisted pull down (seated)  For lumbar  stability        Blank cell = exercise not performed today                                   PATIENT EDUCATION:  Education details: POC, objective findings, prognosis, and goals for therapy Person educated: Patient Education method: Explanation Education comprehension: verbalized understanding  HOME EXERCISE PROGRAM:   ASSESSMENT:  CLINICAL IMPRESSION:  Treatment focused on familiar interventions for improved lower extremity strength. He required minimal cueing with today's interventions for proper exercise performance. He experienced no increase in pain or discomfort with any of today's interventions. He reported feeling a little tired upon the conclusion of treatment. He will potentially be discharged at his next appointment with an updated HEP.   OBJECTIVE IMPAIRMENTS: Abnormal gait, decreased activity tolerance, decreased balance, decreased mobility, difficulty walking, decreased strength, impaired sensation, postural dysfunction, and pain.   ACTIVITY LIMITATIONS: lifting, standing, stairs, transfers, and locomotion level  PARTICIPATION LIMITATIONS: shopping and community activity  PERSONAL FACTORS: Age, Time since onset of injury/illness/exacerbation, and 3+ comorbidities: History of a MI, hypertension, atrial fibrillation, diabetes, CKD, pacemaker, arthritis, and chronic left shoulder pain  are also affecting patient's functional outcome.   REHAB POTENTIAL: Fair    CLINICAL DECISION MAKING: Evolving/moderate complexity  EVALUATION COMPLEXITY: Moderate   GOALS: Goals reviewed with patient? Yes  SHORT TERM GOALS: Target date: 03/21/23  Patient will be independent with his initial HEP. Baseline: Goal status: MET  2.  Patient will improve his 5 times sit to stand time to 30 seconds or less for improved lower extremity power. Baseline: 7/22: 20.04 Goal status: MET  3.  Patient will improve his timed up and go to 15 seconds or less for improved mobility. Baseline: 7/22:  15.3 seconds Goal status: MET  LONG TERM GOALS: Target date: 04/11/23  Patient will be independent with his advanced HEP. Baseline:  Goal status: IN PROGRESS  2.  Patient will improve his 5 times sit to stand time to 20 seconds or less for improved lower extremity power. Baseline: 7/22: 20.04 seconds 8/6: 15.8 seconds Goal status: MET  3.  Patient will be able to transfer from sitting to standing without upper extremity support for improved independence. Baseline:  Goal status: IN PROGRESS  4.  Patient will improve his timed up and go to 12 seconds or less for reduced fall risk. Baseline: 7/22: 15.3 seconds 8/6: 12.2 seconds Goal status: MET  PLAN:  PT FREQUENCY: 2x/week  PT DURATION: 6 weeks  PLANNED INTERVENTIONS: Therapeutic exercises, Therapeutic activity, Neuromuscular re-education, Balance training, Gait training,  Patient/Family education, Self Care, Joint mobilization, Spinal mobilization, Cryotherapy, Moist heat, Manual therapy, and Re-evaluation.  PLAN FOR NEXT SESSION: nustep, lower extremity strengthening, and balance interventions   Granville Lewis, PT 04/18/2023, 6:03 PM

## 2023-04-20 ENCOUNTER — Ambulatory Visit: Payer: Medicare HMO

## 2023-04-20 DIAGNOSIS — R2681 Unsteadiness on feet: Secondary | ICD-10-CM

## 2023-04-20 DIAGNOSIS — M6281 Muscle weakness (generalized): Secondary | ICD-10-CM | POA: Diagnosis not present

## 2023-04-20 NOTE — Therapy (Addendum)
OUTPATIENT PHYSICAL THERAPY THORACOLUMBAR TREATMENT   Patient Name: Wayne Gordon MRN: [id number] DOB:October 29, 1933, 89 y.o., male Today's Date: 04/20/2023  END OF SESSION:  PT End of Session - 04/20/23 1346     Visit Number 12    Number of Visits 12    Date for PT Re-Evaluation 05/26/23    PT Start Time 1345    PT Stop Time 1430    PT Time Calculation (min) 45 min    Activity Tolerance Patient tolerated treatment well    Behavior During Therapy [facility] for tasks assessed/performed                Past Medical History:  Diagnosis Date   Allergy to ACE inhibitors    Angioedema many years ago; patient has tolerated Losartan (ARB) in the past - it was held during [facility] for GI bleed and worsening renal function >> resume Losartan 25 mg QD 06/2015   Arthritis    Atrial fibrillation (HCC)    PAF, CHADs2Vasc = 5   Carotid artery disease (HCC)    60-79% LICA   CHF (congestive heart failure) (HCC)    CKD (chronic kidney disease)    Coronary artery disease    Diabetes mellitus    GERD (gastroesophageal reflux disease)    Hyperlipidemia    Hypertension    ICD (implantable cardiac defibrillator) in place 03/28/2012   [place], Dr. Ladona Ridgel 03/28/12   Ischemic cardiomyopathy    S/P CABG x 3; EF 25%   Mitral regurgitation    S/P mitral valve repair 2013   Myocardial infarction [facility])    Renal insufficiency    Past Surgical History:  Procedure Laterality Date   BIV UPGRADE N/A 10/12/2020   Procedure: BIV ICD UPGRADE;  Surgeon: Marinus Maw, MD;  Location: [facility] INVASIVE CV LAB;  Service: Cardiovascular;  Laterality: N/A;   CHOLECYSTECTOMY N/A 09/20/2017   Procedure: LAPAROSCOPIC SUBTOTAL CHOLECYSTECTOMY;  Surgeon: Axel Filler, MD;  Location: [facility] OR;  Service: General;  Laterality: N/A;   COLONOSCOPY N/A 04/24/2014   RMR Pancolonic diverticulosis   CORONARY ARTERY BYPASS GRAFT  10/17/2011   Procedure: CORONARY ARTERY BYPASS GRAFTING (CABG);  Surgeon: Purcell Nails, MD;   Location: [facility] OR;  Service: Open Heart Surgery;  Laterality: N/A;   ERCP N/A 09/29/2017   Procedure: ENDOSCOPIC RETROGRADE CHOLANGIOPANCREATOGRAPHY (ERCP);  Surgeon: Jeani Hawking, MD;  Location: [facility] ENDOSCOPY;  Service: Endoscopy;  Laterality: N/A;   ESOPHAGOGASTRODUODENOSCOPY N/A 04/24/2014   RMR Subtle nodularity the gastric mucosa of uncertain significance-status post biopsy. Hiatal hernia. chronic inflammation, no H.pylori   ESOPHAGOGASTRODUODENOSCOPY N/A 06/24/2016   Dr. Jena Gauss: normal esophagus, small hiatal hernia, normal duodenum   ICD  03/28/2012   IMPLANTABLE CARDIOVERTER DEFIBRILLATOR IMPLANT N/A 03/28/2012   Procedure: IMPLANTABLE CARDIOVERTER DEFIBRILLATOR IMPLANT;  Surgeon: Marinus Maw, MD;  Location: [facility] CATH LAB;  Service: Cardiovascular;  Laterality: N/A;   KNEE ARTHROSCOPY  ~ 2008   right   LEFT AND RIGHT HEART CATHETERIZATION WITH CORONARY ANGIOGRAM N/A 10/12/2011   Procedure: LEFT AND RIGHT HEART CATHETERIZATION WITH CORONARY ANGIOGRAM;  Surgeon: Iran Ouch, MD;  Location: [facility] CATH LAB;  Service: Cardiovascular;  Laterality: N/A;   MITRAL VALVE REPAIR  10/17/2011   Procedure: MITRAL VALVE REPAIR (MVR);  Surgeon: Purcell Nails, MD;  Location: [facility] OR;  Service: Open Heart Surgery;  Laterality: N/A;   NASAL SINUS SURGERY  1990's   right   Patient Active Problem List   Diagnosis Date Noted   Human metapneumovirus pneumonia  10/16/2021   Acute on chronic systolic CHF (congestive heart failure) (HCC) 10/16/2021   CHF exacerbation (HCC) 10/15/2021   CAD (coronary artery disease) of artery bypass graft 10/15/2021   History of implantable cardioverter-defibrillator (ICD) placement 10/15/2021   Benign essential HTN 10/15/2021   Chronic kidney disease, stage 3b (HCC) 10/15/2021   Acute respiratory failure with hypoxia (HCC) 10/14/2021   Bloating 02/27/2020   Nausea with vomiting 02/27/2020   Abdominal pain 02/27/2020   Iron deficiency anemia 02/08/2018   Stage III chronic  kidney disease (HCC) 02/08/2018   Diabetes mellitus (HCC) 02/08/2018   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   Bacteremia due to Klebsiella pneumoniae 09/22/2017   Sepsis due to Klebsiella pneumoniae (HCC) 09/22/2017   Cholangitis 09/17/2017   Leukocytosis 09/17/2017   Cholelithiasis 09/17/2017   Elevated liver enzymes 09/17/2017   Acute renal failure superimposed on stage 3 chronic kidney disease (HCC) 09/17/2017   Low blood pressure reading 09/17/2017   Lactic acidosis 09/17/2017   Elevated troponin 09/17/2017   Nausea without vomiting 01/10/2017   Gastrointestinal hemorrhage associated with intestinal diverticulosis 06/23/2016   Carotid artery disease (HCC)    Syncope and collapse 07/11/2015   Ventricular arrhythmia 07/10/2015   Complete heart block (HCC)    ICD (implantable cardioverter-defibrillator) discharge    Atrial fibrillation (HCC) 06/04/2015   Diabetes (HCC) 06/02/2015   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/02/2015   GERD (gastroesophageal reflux disease) 02/18/2015   Abdominal pain, epigastric 02/18/2015   Pleural effusion 09/01/2014   Rectal bleeding 03/25/2014   Acute blood loss anemia 03/25/2014   Adult body mass index 28.0-28.9 09/30/2013   Diabetes mellitus, type 2 (HCC) 08/09/2013   Type 2 diabetes mellitus without complication (HCC) 08/09/2013   PVC's (premature ventricular contractions) 07/04/2013   Hyperlipidemia 01/03/2013   Automatic implantable cardioverter-defibrillator in situ 03/30/2012   S/P MVR (mitral valve repair) 01/16/2012   HTN (hypertension) 11/28/2011   Chronic systolic heart failure (HCC) 10/31/2011   Ischemic cardiomyopathy 10/31/2011   CKD (chronic kidney disease) stage 3, GFR 30-59 ml/min (HCC) 10/31/2011   Current use of long term anticoagulation 10/31/2011   Long term (current) use of anticoagulants 10/28/2011   S/P mitral valve repair 10/17/2011   S/P CABG x 3 10/17/2011   Mitral regurgitation 10/12/2011   NSTEMI (non-ST elevated  myocardial infarction) (HCC) 10/11/2011    PCP: Rebekah Chesterfield, NP  REFERRING PROVIDER: Harle Battiest, PA-C   REFERRING DIAG: Status post insertion of spinal cord stimulator; Spinal stenosis, lumbar region with neurogenic claudication   Rationale for Evaluation and Treatment: Rehabilitation  THERAPY DIAG:  Muscle weakness (generalized)  Unsteadiness on feet  ONSET DATE: 1962  SUBJECTIVE:  SUBJECTIVE STATEMENT:  Pt denies any pain today.  Pt ready for discharge.   PERTINENT HISTORY:  History of a MI, hypertension, atrial fibrillation, diabetes, CKD, pacemaker, arthritis, and chronic left shoulder pain  PAIN:  Are you having pain? No  PRECAUTIONS: ICD/Pacemaker  WEIGHT BEARING RESTRICTIONS: No  FALLS:  Has patient fallen in last 6 months? No  LIVING ENVIRONMENT: Lives with: lives with their spouse Lives in: House/apartment Stairs: No Has following equipment at home: Single point cane and Ramped entry  OCCUPATION: retired  PLOF: Independent  PATIENT GOALS: improved strength and balance  NEXT MD VISIT: 05/11/23  OBJECTIVE:   SCREENING FOR RED FLAGS: Bowel or bladder incontinence: No Spinal tumors: No Cauda equina syndrome: No Compression fracture: No Abdominal aneurysm: No  COGNITION: Overall cognitive status: Within functional limits for tasks assessed     SENSATION: Light touch: Impaired  and diminished sensation at right lateral knee with no dermatomal pattern. Patient reports that he has neuropathy in both feet  POSTURE: forward head, decreased lumbar lordosis, and increased thoracic kyphosis  LOWER EXTREMITY ROM: WFL for activities assessed  LOWER EXTREMITY MMT:    MMT Right eval Left eval  Hip flexion 3+/5 3+/5  Hip extension    Hip abduction     Hip adduction    Hip internal rotation    Hip external rotation    Knee flexion 3+/5 4-/5  Knee extension 4-/5 3+/5; knee pain  Ankle dorsiflexion 3+/5 3+/5  Ankle plantarflexion    Ankle inversion    Ankle eversion     (Blank rows = not tested)  FUNCTIONAL TESTS:  5 times sit to stand: 36.99 seconds with UE support from armrests Timed up and go (TUG): 18.28 seconds with SPC   BALANCE:  Narrow BOS, firm surface, eyes open: 30 seconds  Narrow BOS, firm surface, eyes closed: 3 seconds  Semi-tandem, firm surface, eyes open, left leading: 25 seconds  Semi-tandem, firm surface, eyes open, right leading: 30 seconds  GAIT: Assistive device utilized: Single point cane Level of assistance: Modified independence Comments: decreased stride length and gait speed with poor foot clearance bilaterally  TODAY'S TREATMENT:                                                                                                                              DATE:                                   04/20/23 EXERCISE LOG  Exercise Repetitions and Resistance Comments  Nustep  L4 x 15 minutes   LAQ 5# x 3 x 10 reps each  Alternating LE   Seated marching  5# x 3 x 10 reps each  Alternating LE   Seated hip ADD isometric  2 minutes w/ 5 second hold    Rockerboard 3 mins   Standing HS curl  20 reps each  BUE support   Seated clams  Blue t-band x 3 minutes         Blank cell = exercise not performed today                                   PATIENT EDUCATION:  Education details: POC, objective findings, prognosis, and goals for therapy Person educated: Patient Education method: Explanation Education comprehension: verbalized understanding  HOME EXERCISE PROGRAM:   ASSESSMENT:  CLINICAL IMPRESSION:  Pt arrives for today's treatment session denying any pain. Pt reports mild increase in sciatic pain while on Nustep, but pt able to alleviate with use of spinal stimulator. Pt able to demonstrate STS  transfer from treatment table today without use of BUEs, demonstrating increased strength and function.  Pt able to tolerate increased weight and resistance with all exercises performed today.  Pt has met all his goals at this time.  Pt encouraged to call the facility with any questions or concerns.  Pt denied any pain at completion of today's treatment session.  Pt ready for discharge.   PHYSICAL THERAPY DISCHARGE SUMMARY  Visits from Start of Care: 12  Current functional level related to goals / functional outcomes: Patient was able to meet all of his goals for skilled physical therapy.    Remaining deficits: None   Education / Equipment: HEP   Patient agrees to discharge. Patient goals were met. Patient is being discharged due to meeting the stated rehab goals.  Candi Leash, PT, DPT    OBJECTIVE IMPAIRMENTS: Abnormal gait, decreased activity tolerance, decreased balance, decreased mobility, difficulty walking, decreased strength, impaired sensation, postural dysfunction, and pain.   ACTIVITY LIMITATIONS: lifting, standing, stairs, transfers, and locomotion level  PARTICIPATION LIMITATIONS: shopping and community activity  PERSONAL FACTORS: Age, Time since onset of injury/illness/exacerbation, and 3+ comorbidities: History of a MI, hypertension, atrial fibrillation, diabetes, CKD, pacemaker, arthritis, and chronic left shoulder pain  are also affecting patient's functional outcome.   REHAB POTENTIAL: Fair    CLINICAL DECISION MAKING: Evolving/moderate complexity  EVALUATION COMPLEXITY: Moderate   GOALS: Goals reviewed with patient? Yes  SHORT TERM GOALS: Target date: 03/21/23  Patient will be independent with his initial HEP. Baseline: Goal status: MET  2.  Patient will improve his 5 times sit to stand time to 30 seconds or less for improved lower extremity power. Baseline: 7/22: 20.04 Goal status: MET  3.  Patient will improve his timed up and go to 15 seconds or  less for improved mobility. Baseline: 7/22: 15.3 seconds Goal status: MET  LONG TERM GOALS: Target date: 04/11/23  Patient will be independent with his advanced HEP. Baseline:  Goal status: MET  2.  Patient will improve his 5 times sit to stand time to 20 seconds or less for improved lower extremity power. Baseline: 7/22: 20.04 seconds 8/6: 15.8 seconds Goal status: MET  3.  Patient will be able to transfer from sitting to standing without upper extremity support for improved independence. Baseline:  Goal status: MET  4.  Patient will improve his timed up and go to 12 seconds or less for reduced fall risk. Baseline: 7/22: 15.3 seconds 8/6: 12.2 seconds Goal status: MET  PLAN:  PT FREQUENCY: 2x/week  PT DURATION: 6 weeks  PLANNED INTERVENTIONS: Therapeutic exercises, Therapeutic activity, Neuromuscular re-education, Balance training, Gait training, Patient/Family education, Self Care, Joint mobilization, Spinal mobilization, Cryotherapy, Moist heat, Manual therapy, and Re-evaluation.  PLAN FOR NEXT  SESSION: nustep, lower extremity strengthening, and balance interventions   Newman Pies, PTA 04/20/2023, 4:59 PM

## 2023-05-25 ENCOUNTER — Ambulatory Visit: Payer: Medicare HMO

## 2023-05-25 DIAGNOSIS — I442 Atrioventricular block, complete: Secondary | ICD-10-CM

## 2023-05-26 LAB — CUP PACEART REMOTE DEVICE CHECK
Date Time Interrogation Session: 20240926074557
Implantable Lead Connection Status: 753985
Implantable Lead Connection Status: 753985
Implantable Lead Connection Status: 753985
Implantable Lead Implant Date: 20130731
Implantable Lead Implant Date: 20220214
Implantable Lead Implant Date: 20220214
Implantable Lead Location: 753858
Implantable Lead Location: 753859
Implantable Lead Location: 753860
Implantable Lead Model: 365
Implantable Lead Model: 377
Implantable Lead Model: 3830
Implantable Lead Serial Number: 10505793
Implantable Lead Serial Number: 8000207354
Implantable Pulse Generator Implant Date: 20220214
Pulse Gen Model: 429553
Pulse Gen Serial Number: 84824950

## 2023-05-29 ENCOUNTER — Telehealth: Payer: Self-pay | Admitting: *Deleted

## 2023-05-29 NOTE — Telephone Encounter (Signed)
Received a fax stating that the pt has been approved for BMS ( eliquis) patient assistance from 05/26/23 - 08/29/23.

## 2023-06-09 NOTE — Progress Notes (Signed)
Remote ICD transmission.   

## 2023-06-30 ENCOUNTER — Telehealth: Payer: Self-pay | Admitting: *Deleted

## 2023-06-30 NOTE — Telephone Encounter (Signed)
Pt approved for AZ&ME Marcelline Deist) from 08/30/23- 08/28/24.

## 2023-07-18 ENCOUNTER — Telehealth: Payer: Self-pay | Admitting: *Deleted

## 2023-07-18 MED ORDER — DAPAGLIFLOZIN PROPANEDIOL 10 MG PO TABS: 10.0000 mg | ORAL_TABLET | Freq: Every day | ORAL | 3 refills | Status: DC

## 2023-07-18 NOTE — Telephone Encounter (Signed)
Refill for Farxiga sent to [facility]

## 2023-08-24 ENCOUNTER — Ambulatory Visit: Payer: Medicare HMO

## 2023-08-24 DIAGNOSIS — I255 Ischemic cardiomyopathy: Secondary | ICD-10-CM

## 2023-08-26 LAB — CUP PACEART REMOTE DEVICE CHECK
Date Time Interrogation Session: 20241226072534
Implantable Lead Connection Status: 753985
Implantable Lead Connection Status: 753985
Implantable Lead Connection Status: 753985
Implantable Lead Implant Date: 20130731
Implantable Lead Implant Date: 20220214
Implantable Lead Implant Date: 20220214
Implantable Lead Location: 753858
Implantable Lead Location: 753859
Implantable Lead Location: 753860
Implantable Lead Model: 365
Implantable Lead Model: 377
Implantable Lead Model: 3830
Implantable Lead Serial Number: 10505793
Implantable Lead Serial Number: 8000207354
Implantable Pulse Generator Implant Date: 20220214
Pulse Gen Model: 429553
Pulse Gen Serial Number: 84824950

## 2023-11-23 ENCOUNTER — Ambulatory Visit (INDEPENDENT_AMBULATORY_CARE_PROVIDER_SITE_OTHER): Payer: Medicare HMO

## 2023-11-23 DIAGNOSIS — I442 Atrioventricular block, complete: Secondary | ICD-10-CM

## 2023-11-23 LAB — CUP PACEART REMOTE DEVICE CHECK
Date Time Interrogation Session: 20250327081350
Implantable Lead Connection Status: 753985
Implantable Lead Connection Status: 753985
Implantable Lead Connection Status: 753985
Implantable Lead Implant Date: 20130731
Implantable Lead Implant Date: 20220214
Implantable Lead Implant Date: 20220214
Implantable Lead Location: 753858
Implantable Lead Location: 753859
Implantable Lead Location: 753860
Implantable Lead Model: 365
Implantable Lead Model: 377
Implantable Lead Model: 3830
Implantable Lead Serial Number: 10505793
Implantable Lead Serial Number: 8000207354
Implantable Pulse Generator Implant Date: 20220214
Pulse Gen Model: 429553
Pulse Gen Serial Number: 84824950

## 2023-12-03 ENCOUNTER — Encounter: Payer: Self-pay | Admitting: Internal Medicine

## 2024-01-04 NOTE — Progress Notes (Signed)
 Remote ICD transmission.

## 2024-02-19 ENCOUNTER — Other Ambulatory Visit: Payer: Self-pay | Admitting: Internal Medicine

## 2024-02-19 DIAGNOSIS — I480 Paroxysmal atrial fibrillation: Secondary | ICD-10-CM

## 2024-02-19 NOTE — Telephone Encounter (Signed)
 Eliquis  2.5mg  refill request received. Patient is 89 years old, weight-110.7kg, Crea-2.08 on 12/27/23 via [facility] from [facility] , Diagnosis-Afib, and last seen by Dr. Waddell on 02/09/23. Dose is appropriate based on dosing criteria. Will send in refill to requested pharmacy.

## 2024-02-22 ENCOUNTER — Ambulatory Visit

## 2024-02-22 ENCOUNTER — Ambulatory Visit: Payer: Medicare HMO

## 2024-02-22 ENCOUNTER — Telehealth: Payer: Self-pay | Admitting: *Deleted

## 2024-02-22 DIAGNOSIS — I442 Atrioventricular block, complete: Secondary | ICD-10-CM | POA: Diagnosis not present

## 2024-02-22 LAB — CUP PACEART REMOTE DEVICE CHECK
Battery Voltage: 81
Date Time Interrogation Session: 20250626092747
Implantable Lead Connection Status: 753985
Implantable Lead Connection Status: 753985
Implantable Lead Connection Status: 753985
Implantable Lead Implant Date: 20130731
Implantable Lead Implant Date: 20220214
Implantable Lead Implant Date: 20220214
Implantable Lead Location: 753858
Implantable Lead Location: 753859
Implantable Lead Location: 753860
Implantable Lead Model: 365
Implantable Lead Model: 377
Implantable Lead Model: 3830
Implantable Lead Serial Number: 10505793
Implantable Lead Serial Number: 8000207354
Implantable Pulse Generator Implant Date: 20220214
Pulse Gen Model: 429553
Pulse Gen Serial Number: 84824950

## 2024-02-22 NOTE — Telephone Encounter (Signed)
 Home monitor remote follow up transmission from [employer]  Presenting EGM was AS/BiVP with A-V dyssynchrony  Device mode noted to be VVI/BiV-LV  Patient previously in persistent AF but has converted back to sinus rhythm  Called patient to see if he was available to come into clinic for device reprogramming today  Patient agreeable and was scheduled at [facility] clinic for 12pm  Routing to provider for awareness

## 2024-02-25 ENCOUNTER — Ambulatory Visit: Payer: Self-pay | Admitting: Internal Medicine

## 2024-02-26 ENCOUNTER — Telehealth: Payer: Self-pay | Admitting: Internal Medicine

## 2024-02-26 NOTE — Telephone Encounter (Signed)
 Called patient back and assured that his device was programmed correctly and working as it should  He felt like his heart was beating different, because it is, he is back in sinus rhythm. He was in Afib for so long that he got used to feeling off and when he converted back into sinus rhythm and we changed his device mode, his heart began beating normal again which can feel odd if you've been out of rhythm for so long.  All questions answered  Patient appreciative of the call back

## 2024-02-26 NOTE — Telephone Encounter (Signed)
 Pt states that he came into to [place] office last week and had an adjustment to pacer and now it seems like his heartbeat is beating different from before. Requesting cb

## 2024-03-06 ENCOUNTER — Ambulatory Visit: Attending: Internal Medicine | Admitting: Internal Medicine

## 2024-03-06 VITALS — BP 120/70 | HR 87 | Ht 76.0 in | Wt 242.0 lb

## 2024-03-06 DIAGNOSIS — I442 Atrioventricular block, complete: Secondary | ICD-10-CM

## 2024-03-06 DIAGNOSIS — I4891 Unspecified atrial fibrillation: Secondary | ICD-10-CM | POA: Diagnosis not present

## 2024-03-06 LAB — CUP PACEART INCLINIC DEVICE CHECK
Battery Remaining Percentage: 80 %
Battery Voltage: 2.96 V
Brady Statistic AP VP Percent: 85 %
Brady Statistic AP VS Percent: 0 %
Brady Statistic AS VP Percent: 14 %
Brady Statistic AS VS Percent: 0 %
Brady Statistic RA Percent Paced: 85 %
Date Time Interrogation Session: 20250709132847
HighPow Impedance: 68 Ohm
Implantable Lead Connection Status: 753985
Implantable Lead Connection Status: 753985
Implantable Lead Connection Status: 753985
Implantable Lead Implant Date: 20130731
Implantable Lead Implant Date: 20220214
Implantable Lead Implant Date: 20220214
Implantable Lead Location: 753858
Implantable Lead Location: 753859
Implantable Lead Location: 753860
Implantable Lead Model: 365
Implantable Lead Model: 377
Implantable Lead Model: 3830
Implantable Lead Serial Number: 10505793
Implantable Lead Serial Number: 8000207354
Implantable Pulse Generator Implant Date: 20220214
Lead Channel Impedance Value: 464 Ohm
Lead Channel Impedance Value: 522 Ohm
Lead Channel Impedance Value: 620 Ohm
Lead Channel Pacing Threshold Amplitude: 0.6 V
Lead Channel Pacing Threshold Amplitude: 0.9 V
Lead Channel Pacing Threshold Amplitude: 1.6 V
Lead Channel Pacing Threshold Pulse Width: 0.4 ms
Lead Channel Pacing Threshold Pulse Width: 0.4 ms
Lead Channel Pacing Threshold Pulse Width: 0.4 ms
Lead Channel Setting Pacing Amplitude: 1.9 V
Lead Channel Setting Pacing Amplitude: 2 V
Lead Channel Setting Pacing Amplitude: 2.5 V
Lead Channel Setting Pacing Pulse Width: 0.4 ms
Lead Channel Setting Pacing Pulse Width: 0.4 ms
Lead Channel Setting Sensing Sensitivity: 0.8 mV
Lead Channel Setting Sensing Sensitivity: 1.6 mV
Pulse Gen Model: 429553
Pulse Gen Serial Number: 84824950

## 2024-03-06 NOTE — Progress Notes (Signed)
 HPI Mr. Handler returns for followup. He is a pleasant 89 yo man with a h/o persistent atrial fib, Chronic systolic heart failure, sinus node dysfunction, and underwent upgrade to a biv ICD over 3 years ago. He feels better. He has not had syncope. His activity had improved as has his back pain. He had a spinal stimulator placed. He denies chest pain.      Current Outpatient Medications  Medication Sig Dispense Refill   acetaminophen  (TYLENOL ) 325 MG tablet Take 2 tablets (650 mg total) by mouth every 6 (six) hours as needed for mild pain (or Fever >/= 101). 30 tablet 0   albuterol  (VENTOLIN  HFA) 108 (90 Base) MCG/ACT inhaler Inhale 2 puffs into the lungs every 6 (six) hours as needed for wheezing or shortness of breath. 8 g 2   CALCIUM -MAGNESIUM -ZINC PO Take 2 tablets by mouth daily.     carvedilol  (COREG ) 12.5 MG tablet Take 1 tablet (12.5 mg total) by mouth 2 (two) times daily with a meal. 60 tablet 0   Cholecalciferol (VITAMIN D3) 10 MCG (400 UNIT) CAPS Take 400 Units by mouth daily.     Cyanocobalamin (VITAMIN B-12 IJ) Inject 1 mL as directed every 30 (thirty) days.      dapagliflozin  propanediol (FARXIGA ) 10 MG TABS tablet Take 1 tablet (10 mg total) by mouth daily. 90 tablet 3   dextromethorphan -guaiFENesin  (MUCINEX  DM) 30-600 MG 12hr tablet Take 1 tablet by mouth 2 (two) times daily. 30 tablet 0   ELIQUIS  2.5 MG TABS tablet TAKE (1) TABLET TWICE A DAY. 180 tablet 1   FIASP  FLEXTOUCH 100 UNIT/ML FlexTouch Pen Inject 6 Units into the skin every evening.     furosemide  (LASIX ) 40 MG tablet Take 40 mg by mouth daily.     gabapentin  (NEURONTIN ) 100 MG capsule Take 100 mg by mouth 3 (three) times daily.     HUMALOG KWIKPEN 100 UNIT/ML KwikPen Inject 10 Units into the skin 2 (two) times daily.     Insulin  Glargine (TOUJEO  SOLOSTAR) 300 UNIT/ML SOPN Inject 18 Units into the skin daily.     Multiple Vitamins-Minerals (MULTIVITAMINS THER. W/MINERALS) TABS Take 1 tablet by mouth daily.      NIACIN  CR PO Take 400 mg by mouth daily.     omeprazole  (PRILOSEC) 40 MG capsule Take 1 capsule (40 mg total) by mouth daily before breakfast. 90 capsule 1   oxyCODONE  (OXY IR/ROXICODONE ) 5 MG immediate release tablet Take 1 tablet (5 mg total) by mouth every 4 (four) hours as needed for moderate pain. 10 tablet 0   Riboflavin (VITAMIN B-2 PO) Take 1 tablet by mouth daily.     rosuvastatin  (CRESTOR ) 10 MG tablet Take 1 tablet (10 mg total) by mouth every evening. (Patient taking differently: Take 5 mg by mouth every evening.) 60 tablet 0   sodium chloride  (OCEAN) 0.65 % SOLN nasal spray Place 1 spray into both nostrils as needed for congestion. 15 mL 0   traZODone (DESYREL) 50 MG tablet Take 25-50 mg by mouth at bedtime.     No current facility-administered medications for this visit.     Past Medical History:  Diagnosis Date   Allergy to ACE inhibitors    Angioedema many years ago; patient has tolerated Losartan  (ARB) in the past - it was held during admisson for GI bleed and worsening renal function >> resume Losartan  25 mg QD 06/2015   Arthritis    Atrial fibrillation (HCC)    PAF, CHADs2Vasc =  5   Carotid artery disease (HCC)    60-79% LICA   CHF (congestive heart failure) (HCC)    CKD (chronic kidney disease)    Coronary artery disease    Diabetes mellitus    GERD (gastroesophageal reflux disease)    Hyperlipidemia    Hypertension    ICD (implantable cardiac defibrillator) in place 03/28/2012   Biotronik, Dr. Waddell 03/28/12   Ischemic cardiomyopathy    S/P CABG x 3; EF 25%   Mitral regurgitation    S/P mitral valve repair 2013   Myocardial infarction [facility])    Renal insufficiency     ROS:   All systems reviewed and negative except as noted in the HPI.   Past Surgical History:  Procedure Laterality Date   BIV UPGRADE N/A 10/12/2020   Procedure: BIV ICD UPGRADE;  Surgeon: Waddell Danelle ORN, MD;  Location: [facility] INVASIVE CV LAB;  Service: Cardiovascular;  Laterality: N/A;    CHOLECYSTECTOMY N/A 09/20/2017   Procedure: LAPAROSCOPIC SUBTOTAL CHOLECYSTECTOMY;  Surgeon: Rubin Calamity, MD;  Location: [facility] OR;  Service: General;  Laterality: N/A;   COLONOSCOPY N/A 04/24/2014   RMR Pancolonic diverticulosis   CORONARY ARTERY BYPASS GRAFT  10/17/2011   Procedure: CORONARY ARTERY BYPASS GRAFTING (CABG);  Surgeon: Sudie VEAR Laine, MD;  Location[facility] OR;  Service: Open Heart Surgery;  Laterality: N/A;   ERCP N/A 09/29/2017   Procedure: ENDOSCOPIC RETROGRADE CHOLANGIOPANCREATOGRAPHY (ERCP);  Surgeon: Rollin Dover, MD;  Location[facility] ENDOSCOPY;  Service: Endoscopy;  Laterality: N/A;   ESOPHAGOGASTRODUODENOSCOPY N/A 04/24/2014   RMR Subtle nodularity the gastric mucosa of uncertain significance-status post biopsy. Hiatal hernia. chronic inflammation, no H.pylori   ESOPHAGOGASTRODUODENOSCOPY N/A 06/24/2016   Dr. Shaaron: normal esophagus, small hiatal hernia, normal duodenum   ICD  03/28/2012   IMPLANTABLE CARDIOVERTER DEFIBRILLATOR IMPLANT N/A 03/28/2012   Procedure: IMPLANTABLE CARDIOVERTER DEFIBRILLATOR IMPLANT;  Surgeon: Danelle ORN Waddell, MD;  Location: [facility] CATH LAB;  Service: Cardiovascular;  Laterality: N/A;   KNEE ARTHROSCOPY  ~ 2008   right   LEFT AND RIGHT HEART CATHETERIZATION WITH CORONARY ANGIOGRAM N/A 10/12/2011   Procedure: LEFT AND RIGHT HEART CATHETERIZATION WITH CORONARY ANGIOGRAM;  Surgeon: Deatrice DELENA Cage, MD;  Location: [facility] CATH LAB;  Service: Cardiovascular;  Laterality: N/A;   MITRAL VALVE REPAIR  10/17/2011   Procedure: MITRAL VALVE REPAIR (MVR);  Surgeon: Sudie VEAR Laine, MD;  Location: [facility] OR;  Service: Open Heart Surgery;  Laterality: N/A;   NASAL SINUS SURGERY  1990's   right     Family History  Problem Relation Age of Onset   Heart disease Mother    Diabetes Father    Cardiomyopathy Father    Colon cancer Neg Hx      Social History   Socioeconomic History   Marital status: Married    Spouse name: Not on file   Number of children: Not on file    Years of education: Not on file   Highest education level: Not on file  Occupational History   Occupation: Retired  Tobacco Use   Smoking status: Never   Smokeless tobacco: Never  Vaping Use   Vaping status: Never Used  Substance and Sexual Activity   Alcohol use: No    Alcohol/week: 0.0 standard drinks of alcohol   Drug use: No   Sexual activity: Yes  Other Topics Concern   Not on file  Social History Narrative   Not on file   Social Drivers of Health   Financial Resource Strain: Medium Risk (09/05/2022)  Received from [employer]   Overall Financial Resource Strain (CARDIA)    Difficulty of Paying Living Expenses: Somewhat hard  Food Insecurity: Food Insecurity Present (09/05/2022)   Received from [facility]   Hunger Vital Sign    Within the past 12 months, you worried that your food would run out before you got the money to buy more.: Sometimes true    Within the past 12 months, the food you bought just didn't last and you didn't have money to get more.: Sometimes true  Transportation Needs: No Transportation Needs (09/05/2022)   Received from [facility] - Transportation    Lack of Transportation (Medical): No    Lack of Transportation (Non-Medical): No  Physical Activity: Unknown (09/05/2022)   Received from [facility]   Exercise Vital Sign    On average, how many days per week do you engage in moderate to strenuous exercise (like a brisk walk)?: Patient declined    Minutes of Exercise per Session: Not on file  Stress: No Stress Concern Present (09/05/2022)   Received from [facility] - Occupational Stress Questionnaire    Feeling of Stress : Only a little  Social Connections: Moderately Integrated (09/05/2022)   Received from [facility]   Social Network    How would you rate your social network (family, work, friends)?: Adequate participation with social networks  Intimate Partner Violence: Not At Risk  (09/05/2022)   Received from [facility]   HITS    Over the last 12 months how often did your partner physically hurt you?: Never    Over the last 12 months how often did your partner insult you or talk down to you?: Rarely    Over the last 12 months how often did your partner threaten you with physical harm?: Never    Over the last 12 months how often did your partner scream or curse at you?: Rarely     BP 120/70 (BP Location: Left Arm, Patient Position: Sitting, Cuff Size: Normal)   Pulse 87   Ht 6' 4 (1.93 m)   Wt 242 lb (109.8 kg)   SpO2 92%   BMI 29.46 kg/m   Physical Exam:  Well appearing NAD HEENT: Unremarkable Neck:  No JVD, no thyromegally Lymphatics:  No adenopathy Back:  No CVA tenderness Lungs:  Clear HEART:  Regular rate rhythm, no murmurs, no rubs, no clicks Abd:  soft, positive bowel sounds, no organomegally, no rebound, no guarding Ext:  2 plus pulses, no edema, no cyanosis, no clubbing Skin:  No rashes no nodules Neuro:  CN II through XII intact, motor grossly intact  EKG - nsr with bigem PAC's  DEVICE  Normal device function.  See PaceArt for details.   Assess/Plan:.   1.Chronic systolic heart failure - his symptoms are class 2. He will continue his current meds. 2. Persistent atrial fib - I think that he is stable and will allow him to stay in atrial fib as his rates are controlled. 3. ICD - his biotronik biv ICD is working normally. 4. CAD - he is s/p CABG. He will continue his current meds.   Danelle Annsleigh Dragoo,MD

## 2024-03-06 NOTE — Patient Instructions (Signed)

## 2024-03-28 ENCOUNTER — Telehealth: Payer: Self-pay | Admitting: Internal Medicine

## 2024-03-28 NOTE — Telephone Encounter (Signed)
 Per review of Pt's chart, Pt was seen by Dr. Waddell on 03/06/2024.  No changes made to device programming at that visit.  No recent lab work for review in Epic.  Last echo 2023 Pt with EF 25%.

## 2024-03-28 NOTE — Telephone Encounter (Signed)
 Returned call to pt. No answer. No voice mail.

## 2024-03-28 NOTE — Telephone Encounter (Signed)
 Pt c/o swelling/edema: STAT if pt has developed SOB within 24 hours  If swelling, where is the swelling located?   Feet and ankles  How much weight have you gained and in what time span?   Yes  Have you gained 2 pounds in a day or 5 pounds in a week?  5 pounds in a week  Do you have a log of your daily weights (if so, list)?   No  Are you currently taking a fluid pill?   Yes  Are you currently SOB?  No  Have you traveled recently in a car or plane for an extended period of time?  No  Patient stated he had his device adjusted at his last visit and he is now having swelling in his feet and ankle and is feeling weak.  Patient noted he has taken extra fluid medication and it is not helping.

## 2024-03-29 ENCOUNTER — Encounter (HOSPITAL_COMMUNITY): Payer: Self-pay

## 2024-03-29 ENCOUNTER — Inpatient Hospital Stay (HOSPITAL_COMMUNITY)
Admission: EM | Admit: 2024-03-29 | Discharge: 2024-04-01 | DRG: 308 | Disposition: A | Discharge status: Home / Self Care | Attending: Internal Medicine | Admitting: Internal Medicine

## 2024-03-29 ENCOUNTER — Emergency Department (HOSPITAL_COMMUNITY)

## 2024-03-29 ENCOUNTER — Other Ambulatory Visit: Payer: Self-pay

## 2024-03-29 DIAGNOSIS — Z833 Family history of diabetes mellitus: Secondary | ICD-10-CM | POA: Diagnosis not present

## 2024-03-29 DIAGNOSIS — R42 Dizziness and giddiness: Secondary | ICD-10-CM | POA: Diagnosis not present

## 2024-03-29 DIAGNOSIS — Z1152 Encounter for screening for COVID-19: Secondary | ICD-10-CM

## 2024-03-29 DIAGNOSIS — I10 Essential (primary) hypertension: Secondary | ICD-10-CM | POA: Diagnosis present

## 2024-03-29 DIAGNOSIS — Z951 Presence of aortocoronary bypass graft: Secondary | ICD-10-CM | POA: Diagnosis not present

## 2024-03-29 DIAGNOSIS — Z79899 Other long term (current) drug therapy: Secondary | ICD-10-CM | POA: Diagnosis not present

## 2024-03-29 DIAGNOSIS — I2510 Atherosclerotic heart disease of native coronary artery without angina pectoris: Secondary | ICD-10-CM | POA: Diagnosis present

## 2024-03-29 DIAGNOSIS — I25810 Atherosclerosis of coronary artery bypass graft(s) without angina pectoris: Secondary | ICD-10-CM | POA: Diagnosis present

## 2024-03-29 DIAGNOSIS — Z7984 Long term (current) use of oral hypoglycemic drugs: Secondary | ICD-10-CM

## 2024-03-29 DIAGNOSIS — Z95810 Presence of automatic (implantable) cardiac defibrillator: Secondary | ICD-10-CM

## 2024-03-29 DIAGNOSIS — I080 Rheumatic disorders of both mitral and aortic valves: Secondary | ICD-10-CM | POA: Diagnosis present

## 2024-03-29 DIAGNOSIS — Z8249 Family history of ischemic heart disease and other diseases of the circulatory system: Secondary | ICD-10-CM | POA: Diagnosis not present

## 2024-03-29 DIAGNOSIS — I509 Heart failure, unspecified: Secondary | ICD-10-CM

## 2024-03-29 DIAGNOSIS — Z888 Allergy status to other drugs, medicaments and biological substances status: Secondary | ICD-10-CM

## 2024-03-29 DIAGNOSIS — I130 Hypertensive heart and chronic kidney disease with heart failure and stage 1 through stage 4 chronic kidney disease, or unspecified chronic kidney disease: Secondary | ICD-10-CM | POA: Diagnosis present

## 2024-03-29 DIAGNOSIS — E1122 Type 2 diabetes mellitus with diabetic chronic kidney disease: Secondary | ICD-10-CM | POA: Diagnosis present

## 2024-03-29 DIAGNOSIS — Z5986 Financial insecurity: Secondary | ICD-10-CM | POA: Diagnosis not present

## 2024-03-29 DIAGNOSIS — I4891 Unspecified atrial fibrillation: Secondary | ICD-10-CM | POA: Diagnosis present

## 2024-03-29 DIAGNOSIS — I255 Ischemic cardiomyopathy: Secondary | ICD-10-CM | POA: Diagnosis present

## 2024-03-29 DIAGNOSIS — Z952 Presence of prosthetic heart valve: Secondary | ICD-10-CM

## 2024-03-29 DIAGNOSIS — Z794 Long term (current) use of insulin: Secondary | ICD-10-CM | POA: Diagnosis not present

## 2024-03-29 DIAGNOSIS — Z7901 Long term (current) use of anticoagulants: Secondary | ICD-10-CM | POA: Diagnosis not present

## 2024-03-29 DIAGNOSIS — N1832 Chronic kidney disease, stage 3b: Secondary | ICD-10-CM | POA: Diagnosis present

## 2024-03-29 DIAGNOSIS — R531 Weakness: Principal | ICD-10-CM

## 2024-03-29 DIAGNOSIS — E785 Hyperlipidemia, unspecified: Secondary | ICD-10-CM | POA: Diagnosis present

## 2024-03-29 DIAGNOSIS — I480 Paroxysmal atrial fibrillation: Secondary | ICD-10-CM | POA: Diagnosis not present

## 2024-03-29 DIAGNOSIS — Z880 Allergy status to penicillin: Secondary | ICD-10-CM

## 2024-03-29 DIAGNOSIS — I252 Old myocardial infarction: Secondary | ICD-10-CM | POA: Diagnosis not present

## 2024-03-29 DIAGNOSIS — I4821 Permanent atrial fibrillation: Principal | ICD-10-CM | POA: Diagnosis present

## 2024-03-29 DIAGNOSIS — E119 Type 2 diabetes mellitus without complications: Secondary | ICD-10-CM

## 2024-03-29 DIAGNOSIS — I4892 Unspecified atrial flutter: Secondary | ICD-10-CM | POA: Diagnosis present

## 2024-03-29 DIAGNOSIS — I5021 Acute systolic (congestive) heart failure: Secondary | ICD-10-CM

## 2024-03-29 DIAGNOSIS — K219 Gastro-esophageal reflux disease without esophagitis: Secondary | ICD-10-CM | POA: Diagnosis present

## 2024-03-29 DIAGNOSIS — K5900 Constipation, unspecified: Secondary | ICD-10-CM | POA: Diagnosis present

## 2024-03-29 DIAGNOSIS — I5023 Acute on chronic systolic (congestive) heart failure: Secondary | ICD-10-CM | POA: Diagnosis not present

## 2024-03-29 DIAGNOSIS — I4819 Other persistent atrial fibrillation: Secondary | ICD-10-CM | POA: Diagnosis not present

## 2024-03-29 DIAGNOSIS — Z9181 History of falling: Secondary | ICD-10-CM

## 2024-03-29 DIAGNOSIS — N183 Chronic kidney disease, stage 3 unspecified: Secondary | ICD-10-CM | POA: Diagnosis not present

## 2024-03-29 LAB — CBC WITH DIFFERENTIAL/PLATELET
Abs Immature Granulocytes: 0.01 K/uL (ref 0.00–0.07)
Basophils Absolute: 0 K/uL (ref 0.0–0.1)
Basophils Relative: 0 %
Eosinophils Absolute: 0.1 K/uL (ref 0.0–0.5)
Eosinophils Relative: 2 %
HCT: 44 % (ref 39.0–52.0)
Hemoglobin: 14 g/dL (ref 13.0–17.0)
Immature Granulocytes: 0 %
Lymphocytes Relative: 26 %
Lymphs Abs: 1.8 K/uL (ref 0.7–4.0)
MCH: 30.4 pg (ref 26.0–34.0)
MCHC: 31.8 g/dL (ref 30.0–36.0)
MCV: 95.7 fL (ref 80.0–100.0)
Monocytes Absolute: 0.7 K/uL (ref 0.1–1.0)
Monocytes Relative: 11 %
Neutro Abs: 4.1 K/uL (ref 1.7–7.7)
Neutrophils Relative %: 61 %
Platelets: 203 K/uL (ref 150–400)
RBC: 4.6 MIL/uL (ref 4.22–5.81)
RDW: 15.6 % — ABNORMAL HIGH (ref 11.5–15.5)
WBC: 6.8 K/uL (ref 4.0–10.5)
nRBC: 0 % (ref 0.0–0.2)

## 2024-03-29 LAB — URINALYSIS, ROUTINE W REFLEX MICROSCOPIC
Bilirubin Urine: NEGATIVE
Glucose, UA: 150 mg/dL — AB
Hgb urine dipstick: NEGATIVE
Ketones, ur: NEGATIVE mg/dL
Leukocytes,Ua: NEGATIVE
Nitrite: NEGATIVE
Protein, ur: NEGATIVE mg/dL
Specific Gravity, Urine: 1.015 (ref 1.005–1.030)
pH: 5 (ref 5.0–8.0)

## 2024-03-29 LAB — COMPREHENSIVE METABOLIC PANEL WITH GFR
ALT: 20 U/L (ref 0–44)
AST: 25 U/L (ref 15–41)
Albumin: 3.7 g/dL (ref 3.5–5.0)
Alkaline Phosphatase: 90 U/L (ref 38–126)
Anion gap: 15 (ref 5–15)
BUN: 27 mg/dL — ABNORMAL HIGH (ref 8–23)
CO2: 24 mmol/L (ref 22–32)
Calcium: 8.7 mg/dL — ABNORMAL LOW (ref 8.9–10.3)
Chloride: 103 mmol/L (ref 98–111)
Creatinine, Ser: 2.08 mg/dL — ABNORMAL HIGH (ref 0.61–1.24)
GFR, Estimated: 30 mL/min — ABNORMAL LOW (ref >60)
Glucose, Bld: 101 mg/dL — ABNORMAL HIGH (ref 70–99)
Potassium: 3.5 mmol/L (ref 3.5–5.1)
Sodium: 142 mmol/L (ref 135–145)
Total Bilirubin: 1.2 mg/dL (ref 0.0–1.2)
Total Protein: 6.7 g/dL (ref 6.5–8.1)

## 2024-03-29 LAB — CBG MONITORING, ED: Glucose-Capillary: 106 mg/dL — ABNORMAL HIGH (ref 70–99)

## 2024-03-29 LAB — TROPONIN I (HIGH SENSITIVITY)
Troponin I (High Sensitivity): 19 ng/L — ABNORMAL HIGH (ref <18)
Troponin I (High Sensitivity): 19 ng/L — ABNORMAL HIGH (ref <18)

## 2024-03-29 LAB — GLUCOSE, CAPILLARY: Glucose-Capillary: 132 mg/dL — ABNORMAL HIGH (ref 70–99)

## 2024-03-29 LAB — BRAIN NATRIURETIC PEPTIDE: B Natriuretic Peptide: 692 pg/mL — ABNORMAL HIGH (ref 0.0–100.0)

## 2024-03-29 MED ORDER — INSULIN ASPART 100 UNIT/ML IJ SOLN: 0.0000 [IU] | Freq: Every day | INTRAMUSCULAR | Status: DC

## 2024-03-29 MED ORDER — INSULIN ASPART 100 UNIT/ML IJ SOLN
4.0000 [IU] | Freq: Two times a day (BID) | INTRAMUSCULAR | Status: DC
  Administered 2024-03-30: 4 [IU] via SUBCUTANEOUS
  Administered 2024-03-30: 2 [IU] via SUBCUTANEOUS
  Administered 2024-03-31: 1 [IU] via SUBCUTANEOUS

## 2024-03-29 MED ORDER — CARVEDILOL 12.5 MG PO TABS
12.5000 mg | ORAL_TABLET | Freq: Two times a day (BID) | ORAL | Status: DC
Start: 2024-03-30 — End: 2024-04-01
  Administered 2024-03-30 – 2024-04-01 (×5): 12.5 mg via ORAL
  Filled 2024-03-29 (×5): qty 1

## 2024-03-29 MED ORDER — FUROSEMIDE 10 MG/ML IJ SOLN
60.0000 mg | Freq: Two times a day (BID) | INTRAMUSCULAR | Status: DC
  Administered 2024-03-30 – 2024-03-31 (×3): 60 mg via INTRAVENOUS
  Filled 2024-03-29 (×3): qty 6

## 2024-03-29 MED ORDER — GABAPENTIN 100 MG PO CAPS
200.0000 mg | ORAL_CAPSULE | Freq: Every day | ORAL | Status: DC
  Administered 2024-03-29 – 2024-03-31 (×3): 200 mg via ORAL
  Filled 2024-03-29 (×3): qty 2

## 2024-03-29 MED ORDER — INSULIN LISPRO (1 UNIT DIAL) 100 UNIT/ML (KWIKPEN): 4.0000 [IU] | PEN_INJECTOR | Freq: Two times a day (BID) | SUBCUTANEOUS | Status: DC

## 2024-03-29 MED ORDER — ROSUVASTATIN CALCIUM 5 MG PO TABS
5.0000 mg | ORAL_TABLET | Freq: Every day | ORAL | Status: DC
  Administered 2024-03-30 – 2024-04-01 (×3): 5 mg via ORAL
  Filled 2024-03-29 (×4): qty 1

## 2024-03-29 MED ORDER — ONDANSETRON HCL 4 MG/2ML IJ SOLN: 4.0000 mg | Freq: Four times a day (QID) | INTRAMUSCULAR | Status: DC | PRN

## 2024-03-29 MED ORDER — POTASSIUM CHLORIDE CRYS ER 20 MEQ PO TBCR
40.0000 meq | EXTENDED_RELEASE_TABLET | Freq: Two times a day (BID) | ORAL | Status: DC
  Administered 2024-03-29 – 2024-04-01 (×6): 40 meq via ORAL
  Filled 2024-03-29 (×6): qty 2

## 2024-03-29 MED ORDER — ONDANSETRON HCL 4 MG PO TABS: 4.0000 mg | ORAL_TABLET | Freq: Four times a day (QID) | ORAL | Status: DC | PRN

## 2024-03-29 MED ORDER — INSULIN ASPART 100 UNIT/ML IJ SOLN: 0.0000 [IU] | Freq: Three times a day (TID) | INTRAMUSCULAR | Status: DC

## 2024-03-29 MED ORDER — FUROSEMIDE 10 MG/ML IJ SOLN
60.0000 mg | Freq: Once | INTRAMUSCULAR | Status: AC
  Administered 2024-03-29: 60 mg via INTRAVENOUS
  Filled 2024-03-29: qty 6

## 2024-03-29 MED ORDER — APIXABAN 2.5 MG PO TABS
2.5000 mg | ORAL_TABLET | Freq: Two times a day (BID) | ORAL | Status: DC
Start: 2024-03-29 — End: 2024-04-01
  Administered 2024-03-29 – 2024-04-01 (×7): 2.5 mg via ORAL
  Filled 2024-03-29 (×6): qty 1

## 2024-03-29 MED ORDER — PANTOPRAZOLE SODIUM 40 MG PO TBEC
40.0000 mg | DELAYED_RELEASE_TABLET | Freq: Every day | ORAL | Status: DC
  Administered 2024-03-30 – 2024-04-01 (×3): 40 mg via ORAL
  Filled 2024-03-29 (×3): qty 1

## 2024-03-29 MED ORDER — INSULIN GLARGINE (1 UNIT DIAL) 300 UNIT/ML ~~LOC~~ SOPN: 26.0000 [IU] | PEN_INJECTOR | Freq: Every day | SUBCUTANEOUS | Status: DC

## 2024-03-29 MED ORDER — POLYETHYLENE GLYCOL 3350 17 G PO PACK: 17.0000 g | PACK | Freq: Every day | ORAL | Status: DC | PRN

## 2024-03-29 MED ORDER — INSULIN ASPART 100 UNIT/ML IJ SOLN
0.0000 [IU] | Freq: Three times a day (TID) | INTRAMUSCULAR | Status: DC
  Administered 2024-03-30: 2 [IU] via SUBCUTANEOUS
  Administered 2024-03-30: 3 [IU] via SUBCUTANEOUS
  Administered 2024-03-31: 8 [IU] via SUBCUTANEOUS
  Administered 2024-03-31: 5 [IU] via SUBCUTANEOUS

## 2024-03-29 MED ORDER — FUROSEMIDE 10 MG/ML IJ SOLN: 40.0000 mg | Freq: Two times a day (BID) | INTRAMUSCULAR | Status: DC

## 2024-03-29 MED ORDER — DAPAGLIFLOZIN PROPANEDIOL 10 MG PO TABS
10.0000 mg | ORAL_TABLET | Freq: Every day | ORAL | Status: DC
Start: 2024-03-30 — End: 2024-04-01
  Administered 2024-03-30 – 2024-04-01 (×3): 10 mg via ORAL
  Filled 2024-03-29 (×3): qty 1

## 2024-03-29 MED ORDER — INSULIN GLARGINE-YFGN 100 UNIT/ML ~~LOC~~ SOLN
26.0000 [IU] | Freq: Every day | SUBCUTANEOUS | Status: DC
  Administered 2024-03-30: 18 [IU] via SUBCUTANEOUS
  Administered 2024-03-31: 20 [IU] via SUBCUTANEOUS
  Administered 2024-04-01: 26 [IU] via SUBCUTANEOUS
  Filled 2024-03-29 (×3): qty 0.26

## 2024-03-29 NOTE — ED Notes (Signed)
 Attempted to interrogate pacemaker x3 and was unsuccessful. MD aware

## 2024-03-29 NOTE — Consult Note (Incomplete)
 Cardiology Consultation   Patient ID: ZAE KIRTZ MRN: [id number]; DOB: November 14, 1933  Admit date: 03/29/2024 Date of Consult: 03/29/2024  PCP:  Renato Dorothey CHRISTELLA, NP   [facility] Providers Cardiologist:  Danelle Birmingham, MD   { Click here to update MD or APP on Care Team, Refresh:1}     Patient Profile: [facility] is a 89 y.o. male with a hx of h/o CAD s/p CABG with MVR 2013, ICM with baseline EF 20-25%, VT, sinus node dysfunction s/p ICD 2013 with upgrade of Biotronik BiV ICD 09/2020, permanent atrial fibrillation, HTN, HLD, DM II and stage III CKD, spinal cord stimulator  who is being seen 03/29/2024 for the evaluation of generalized weakness, weight gain, ankle edema- CHF symptoms at the request of Dr Barbra.  History of Present Illness: [facility] is a 89 y.o. male with a hx of h/o CAD s/p CABG with MVR 2013, ICM with baseline EF 20-25%, VT, sinus node dysfunction s/p ICD 2013 with upgrade of Biotronik BiV ICD 09/2020, permanent atrial fibrillation, HTN, HLD, DM II and stage III CKD, spinal cord stimulator  who is being seen 03/29/2024 for the evaluation of generalized weakness, weight gain, ankle edema- CHF symptoms   Patient presented to [facility]-with the above complaints of weakness, weight gain, CHF symptoms, was given IV Lasix  and transferred to [facility] health for further evaluation ER visit vitals are stable. Labs showed BNP elevation of 692, increased from before, troponin flat at 19/19, creatinine slightly increased to 2.0, baseline is 1.7-1.8, EGFR is upper 30s,. Chest x-ray shows mild diffuse pulmonary vascular congestion with bilateral pleural effusion. EKG shows V paced rhythm but underlying baseline artifact and probably underlying rhythm is A-fib, rhythm is irregular  Last echo is 2023 EF 25%, septal flattening consistent with RV pressure and volume overload, diffuse hypokinesis with basal inferior septal akinesis, RV is moderately reduced, mild MR, aortic  sclerosis present    Past Medical History:  Diagnosis Date   Allergy to ACE inhibitors    Angioedema many years ago; patient has tolerated Losartan  (ARB) in the past - it was held during admisson for GI bleed and worsening renal function >> resume Losartan  25 mg QD 06/2015   Arthritis    Atrial fibrillation (HCC)    PAF, CHADs2Vasc = 5   Carotid artery disease (HCC)    60-79% LICA   CHF (congestive heart failure) (HCC)    CKD (chronic kidney disease)    Coronary artery disease    Diabetes mellitus    GERD (gastroesophageal reflux disease)    Hyperlipidemia    Hypertension    ICD (implantable cardiac defibrillator) in place 03/28/2012   Biotronik, Dr. Birmingham 03/28/12   Ischemic cardiomyopathy    S/P CABG x 3; EF 25%   Mitral regurgitation    S/P mitral valve repair 2013   Myocardial infarction [facility])    Renal insufficiency     Past Surgical History:  Procedure Laterality Date   BIV UPGRADE N/A 10/12/2020   Procedure: BIV ICD UPGRADE;  Surgeon: Birmingham Danelle ORN, MD;  Location: [facility] INVASIVE CV LAB;  Service: Cardiovascular;  Laterality: N/A;   CHOLECYSTECTOMY N/A 09/20/2017   Procedure: LAPAROSCOPIC SUBTOTAL CHOLECYSTECTOMY;  Surgeon: Rubin Calamity, MD;  Location: [facility] OR;  Service: General;  Laterality: N/A;   COLONOSCOPY N/A 04/24/2014   RMR Pancolonic diverticulosis   CORONARY ARTERY BYPASS GRAFT  10/17/2011   Procedure: CORONARY ARTERY BYPASS GRAFTING (CABG);  Surgeon: Sudie VEAR Laine, MD;  Location: [facility] OR;  Service: Open Heart Surgery;  Laterality: N/A;   ERCP N/A 09/29/2017   Procedure: ENDOSCOPIC RETROGRADE CHOLANGIOPANCREATOGRAPHY (ERCP);  Surgeon: Rollin Dover, MD;  Location: [facility] ENDOSCOPY;  Service: Endoscopy;  Laterality: N/A;   ESOPHAGOGASTRODUODENOSCOPY N/A 04/24/2014   RMR Subtle nodularity the gastric mucosa of uncertain significance-status post biopsy. Hiatal hernia. chronic inflammation, no H.pylori   ESOPHAGOGASTRODUODENOSCOPY N/A 06/24/2016   Dr. Shaaron: normal  esophagus, small hiatal hernia, normal duodenum   ICD  03/28/2012   IMPLANTABLE CARDIOVERTER DEFIBRILLATOR IMPLANT N/A 03/28/2012   Procedure: IMPLANTABLE CARDIOVERTER DEFIBRILLATOR IMPLANT;  Surgeon: Danelle LELON Birmingham, MD;  Location: [facility] CATH LAB;  Service: Cardiovascular;  Laterality: N/A;   KNEE ARTHROSCOPY  ~ 2008   right   LEFT AND RIGHT HEART CATHETERIZATION WITH CORONARY ANGIOGRAM N/A 10/12/2011   Procedure: LEFT AND RIGHT HEART CATHETERIZATION WITH CORONARY ANGIOGRAM;  Surgeon: Deatrice DELENA Cage, MD;  Location: [facility] CATH LAB;  Service: Cardiovascular;  Laterality: N/A;   MITRAL VALVE REPAIR  10/17/2011   Procedure: MITRAL VALVE REPAIR (MVR);  Surgeon: Sudie VEAR Laine, MD;  Location: [facility] OR;  Service: Open Heart Surgery;  Laterality: N/A;   NASAL SINUS SURGERY  1990's   right     Home Medications:  Prior to Admission medications   Medication Sig Start Date End Date Taking? Authorizing Provider  acetaminophen  (TYLENOL ) 650 MG CR tablet Take 1,300 mg by mouth every 8 (eight) hours as needed for pain.   Yes [provider]  carvedilol  (COREG ) 12.5 MG tablet Take 1 tablet (12.5 mg total) by mouth 2 (two) times daily with a meal. 10/18/21  Yes Milissa Tod PARAS, MD  dapagliflozin  propanediol (FARXIGA ) 10 MG TABS tablet Take 1 tablet (10 mg total) by mouth daily. 07/18/23  Yes Birmingham Danelle LELON, MD  diphenhydramine-acetaminophen  (TYLENOL  PM) 25-500 MG TABS tablet Take 1 tablet by mouth at bedtime.   Yes [provider]  ELIQUIS  2.5 MG TABS tablet TAKE (1) TABLET TWICE A DAY. Patient taking differently: Take 2.5 mg by mouth 2 (two) times daily. 02/19/24  Yes Birmingham Danelle LELON, MD  furosemide  (LASIX ) 40 MG tablet Take 40 mg by mouth daily. 03/09/20  Yes [provider]  gabapentin  (NEURONTIN ) 100 MG capsule Take 200-300 mg by mouth at bedtime. Takes an extra capsule if struggling to sleep.   Yes [provider]  HUMALOG KWIKPEN 100 UNIT/ML KwikPen Inject 8-10 Units into the  skin 2 (two) times daily.   Yes [provider]  Insulin  Glargine (TOUJEO  SOLOSTAR) 300 UNIT/ML SOPN Inject 26 Units into the skin daily. 04/27/15  Yes [provider]  omeprazole  (PRILOSEC) 40 MG capsule Take 1 capsule (40 mg total) by mouth daily before breakfast. 02/27/20  Yes [name] Josette RAMAN, PA-C  rosuvastatin  (CRESTOR ) 5 MG tablet Take 5 mg by mouth daily.   Yes [provider]  sodium chloride  (OCEAN) 0.65 % SOLN nasal spray Place 1 spray into both nostrils as needed for congestion. 10/18/21  Yes Milissa Tod PARAS, MD    Scheduled Meds:  Continuous Infusions:  PRN Meds:   Allergies:    Allergies  Allergen Reactions   Ace Inhibitors Swelling and Other (See Comments)    Kidney issues; angioedema per PMH in chart   Penicillins Palpitations    Social History:   Social History   Socioeconomic History   Marital status: Married    Spouse name: Not on file   Number of children: Not on file   Years of education:  Not on file   Highest education level: Not on file  Occupational History   Occupation: Retired  Tobacco Use   Smoking status: Never   Smokeless tobacco: Never  Vaping Use   Vaping status: Never Used  Substance and Sexual Activity   Alcohol use: No    Alcohol/week: 0.0 standard drinks of alcohol   Drug use: No   Sexual activity: Yes  Other Topics Concern   Not on file  Social History Narrative   Not on file   Social Drivers of Health   Financial Resource Strain: Medium Risk (09/05/2022)   Received from [facility]   Overall Financial Resource Strain (CARDIA)    Difficulty of Paying Living Expenses: Somewhat hard  Food Insecurity: Food Insecurity Present (09/05/2022)   Received from [facility]   Hunger Vital Sign    Within the past 12 months, you worried that your food would run out before you got the money to buy more.: Sometimes true    Within the past 12 months, the food you bought just didn't last and you didn't have money to  get more.: Sometimes true  Transportation Needs: No Transportation Needs (09/05/2022)   Received from [facility] - Transportation    Lack of Transportation (Medical): No    Lack of Transportation (Non-Medical): No  Physical Activity: Unknown (09/05/2022)   Received from [facility]   Exercise Vital Sign    On average, how many days per week do you engage in moderate to strenuous exercise (like a brisk walk)?: Patient declined    Minutes of Exercise per Session: Not on file  Stress: No Stress Concern Present (09/05/2022)   Received from [facility] - Occupational Stress Questionnaire    Feeling of Stress : Only a little  Social Connections: Moderately Integrated (09/05/2022)   Received from [facility]   Social Network    How would you rate your social network (family, work, friends)?: Adequate participation with social networks  Intimate Partner Violence: Not At Risk (09/05/2022)   Received from [facility]   HITS    Over the last 12 months how often did your partner physically hurt you?: Never    Over the last 12 months how often did your partner insult you or talk down to you?: Rarely    Over the last 12 months how often did your partner threaten you with physical harm?: Never    Over the last 12 months how often did your partner scream or curse at you?: Rarely    Family History:    Family History  Problem Relation Age of Onset   Heart disease Mother    Diabetes Father    Cardiomyopathy Father    Colon cancer Neg Hx      ROS:  Please see the history of present illness.   All other ROS reviewed and negative.     Physical Exam/Data: Vitals:   03/29/24 1106 03/29/24 1600 03/29/24 2000  BP: 101/69 125/86 121/89  Pulse: 73    Resp: 18  15  Temp: (!) 97.5 F (36.4 C)    TempSrc: Oral    SpO2: 95%    Weight: 108.9 kg    Height: 6' 4 (1.93 m)     No intake or output data in the 24 hours ending 03/29/24 2039     03/29/2024   11:06 AM 03/06/2024    1:25 PM 02/09/2023    9:20 AM  Last  3 Weights  Weight (lbs) 240 lb 242 lb 244 lb  Weight (kg) 108.863 kg 109.77 kg 110.678 kg     Body mass index is 29.21 kg/m.  General:  Well nourished, well developed, in no acute distress HEENT: normal Neck:  JVD Vascular: No carotid bruits; Distal pulses 2+ bilaterally Cardiac:  normal S1, S2; RRR; no murmur  Lungs: Bilateral crackles Abd: soft, nontender, no hepatomegaly  Ext: no edema Musculoskeletal:  No deformities, BUE and BLE strength normal and equal Skin: warm and dry  Neuro:  CNs 2-12 intact, no focal abnormalities noted Psych:  Normal affect     Laboratory Data: High Sensitivity Troponin:   Recent Labs  Lab 03/29/24 1120 03/29/24 1302  TROPONINIHS 19* 19*     Chemistry Recent Labs  Lab 03/29/24 1120  NA 142  K 3.5  CL 103  CO2 24  GLUCOSE 101*  BUN 27*  CREATININE 2.08*  CALCIUM  8.7*  GFRNONAA 30*  ANIONGAP 15    Recent Labs  Lab 03/29/24 1120  PROT 6.7  ALBUMIN  3.7  AST 25  ALT 20  ALKPHOS 90  BILITOT 1.2   Lipids No results for input(s): CHOL, TRIG, HDL, LABVLDL, LDLCALC, CHOLHDL in the last 168 hours.  Hematology Recent Labs  Lab 03/29/24 1120  WBC 6.8  RBC 4.60  HGB 14.0  HCT 44.0  MCV 95.7  MCH 30.4  MCHC 31.8  RDW 15.6*  PLT 203   Thyroid No results for input(s): TSH, FREET4 in the last 168 hours.  BNP Recent Labs  Lab 03/29/24 1120  BNP 692.0*    DDimer No results for input(s): DDIMER in the last 168 hours.  Radiology/Studies:  DG Chest Portable 1 View Result Date: 03/29/2024 CLINICAL DATA:  Weakness.  Dizziness. EXAM: PORTABLE CHEST 1 VIEW COMPARISON:  10/15/2021. FINDINGS: Low lung volume. Mild diffuse pulmonary vascular congestion, likely accentuated by low lung volume. No frank pulmonary edema. There are probable atelectatic changes at the lung bases. There is subtle blunting of bilateral lateral costophrenic angles, which may  be due to trace pleural effusions versus pleural thickening. No pneumothorax on either side. Stable cardio-mediastinal silhouette. There are surgical staples along the heart border and sternotomy wires, status post CABG (coronary artery bypass graft). There is a left sided 3-lead pacemaker. Neurostimulator device noted overlying the lower thoracic spine region. No acute osseous abnormalities. The soft tissues are within normal limits. IMPRESSION: Mild diffuse pulmonary vascular congestion, likely accentuated by low lung volume. No frank pulmonary edema. Electronically Signed   By: Ree Molt M.D.   On: 03/29/2024 12:09   .ch  Assessment and Plan: Ac on chronic decompensated HFrEF, EF 20-25% Persistent Afib H/o BIV ICD  CAD s/p CABG with MVR 2013 Multiple other comorbidities: HTN, HLD, DM-2, CKD, spinal cord stimulator   Plan: -> Continue IV diuresis 60 mg twice daily GDMT: Unable to do ACE ARB or Arni due to CKD, continue Coreg  12.5 mg twice daily, Jardiance 10 mg daily, Crestor  5 mg daily, Eliquis  2.5 mg twice daily for A-fib EP team to see him in am Echocardiogram in a.m.    Risk Assessment/Risk Scores: {Complete the following score calculators/questions to meet required metrics.  Press F2         :[id number]}      New York  Heart Association (NYHA) Functional Class NYHA Class III  CHA2DS2-VASc Score =   4 {Confirm score is correct.  If not, click here to update score.  REFRESH note.  :1} This  indicates a  % annual risk of stroke. The patient's score is based upon:    {This patient has a significant risk of stroke if diagnosed with atrial fibrillation.  Please consider VKA or DOAC agent for anticoagulation if the bleeding risk is acceptable.   You can also use the SmartPhrase .[email or web address] for documentation.   :[id number]}     For questions or updates, please contact [facility] Please consult [email or web address] for contact info under    Signed, Grayce Bold, MD   03/29/2024 8:39 PM

## 2024-03-29 NOTE — ED Triage Notes (Signed)
 Pt comes from home by EMS for weakness. For the last few weeks has been experiencing weakness. Pt became dizzy when getting up to walk.    120/82 BP 86 HR w/ A-fib 96% RA 98 BG 97.4 temp

## 2024-03-29 NOTE — H&P (Signed)
 History and Physical    Patient: [facility] FMW:[id number] DOB: 1933/11/29 DOA: 03/29/2024 DOS: the patient was seen and examined on 03/29/2024 PCP: Renato Dorothey HERO, NP  Patient coming from: Home  Chief Complaint:  Chief Complaint  Patient presents with   Weakness   Atrial Fibrillation   HPI: [facility] is a 89 y.o. male with medical history significant of hypertension, stage III chronic kidney disease, diabetes, ischemic cardiomyopathy, atrial fibrillation on Eliquis , heart failure with reduced EF of 25%, ICD/pacer present.  The patient was seen on 7/9 by EP. After than visit, he has experiences weight gain and generalized weakness that have been progressive. He had difficulty getting out of bed and walking. He has doubled his dose of lasix  over the last few days, but this has not been helpful.  Denies fevers, chills, nausea, vomiting.  Denies chest pain.  No particular shortness of breath, chest feels tired.  Review of Systems: As mentioned in the history of present illness. All other systems reviewed and are negative. Past Medical History:  Diagnosis Date   Allergy to ACE inhibitors    Angioedema many years ago; patient has tolerated Losartan  (ARB) in the past - it was held during admisson for GI bleed and worsening renal function >> resume Losartan  25 mg QD 06/2015   Arthritis    Atrial fibrillation (HCC)    PAF, CHADs2Vasc = 5   Carotid artery disease (HCC)    60-79% LICA   CHF (congestive heart failure) (HCC)    CKD (chronic kidney disease)    Coronary artery disease    Diabetes mellitus    GERD (gastroesophageal reflux disease)    Hyperlipidemia    Hypertension    ICD (implantable cardiac defibrillator) in place 03/28/2012   Biotronik, Dr. Waddell 03/28/12   Ischemic cardiomyopathy    S/P CABG x 3; EF 25%   Mitral regurgitation    S/P mitral valve repair 2013   Myocardial infarction [facility])    Renal insufficiency    Past Surgical History:  Procedure Laterality Date    BIV UPGRADE N/A 10/12/2020   Procedure: BIV ICD UPGRADE;  Surgeon: Waddell Danelle ORN, MD;  Location: [facility] INVASIVE CV LAB;  Service: Cardiovascular;  Laterality: N/A;   CHOLECYSTECTOMY N/A 09/20/2017   Procedure: LAPAROSCOPIC SUBTOTAL CHOLECYSTECTOMY;  Surgeon: Rubin Calamity, MD;  Location: [facility] OR;  Service: General;  Laterality: N/A;   COLONOSCOPY N/A 04/24/2014   RMR Pancolonic diverticulosis   CORONARY ARTERY BYPASS GRAFT  10/17/2011   Procedure: CORONARY ARTERY BYPASS GRAFTING (CABG);  Surgeon: Sudie VEAR Laine, MD;  Location: [facility] OR;  Service: Open Heart Surgery;  Laterality: N/A;   ERCP N/A 09/29/2017   Procedure: ENDOSCOPIC RETROGRADE CHOLANGIOPANCREATOGRAPHY (ERCP);  Surgeon: Rollin Dover, MD;  Location: [facility] ENDOSCOPY;  Service: Endoscopy;  Laterality: N/A;   ESOPHAGOGASTRODUODENOSCOPY N/A 04/24/2014   RMR Subtle nodularity the gastric mucosa of uncertain significance-status post biopsy. Hiatal hernia. chronic inflammation, no H.pylori   ESOPHAGOGASTRODUODENOSCOPY N/A 06/24/2016   Dr. Shaaron: normal esophagus, small hiatal hernia, normal duodenum   ICD  03/28/2012   IMPLANTABLE CARDIOVERTER DEFIBRILLATOR IMPLANT N/A 03/28/2012   Procedure: IMPLANTABLE CARDIOVERTER DEFIBRILLATOR IMPLANT;  Surgeon: Danelle ORN Waddell, MD;  Location: [facility] CATH LAB;  Service: Cardiovascular;  Laterality: N/A;   KNEE ARTHROSCOPY  ~ 2008   right   LEFT AND RIGHT HEART CATHETERIZATION WITH CORONARY ANGIOGRAM N/A 10/12/2011   Procedure: LEFT AND RIGHT HEART CATHETERIZATION WITH CORONARY ANGIOGRAM;  Surgeon: Deatrice DELENA Cage, MD;  Location: [facility]  CATH LAB;  Service: Cardiovascular;  Laterality: N/A;   MITRAL VALVE REPAIR  10/17/2011   Procedure: MITRAL VALVE REPAIR (MVR);  Surgeon: Sudie VEAR Laine, MD;  Location: [facility] OR;  Service: Open Heart Surgery;  Laterality: N/A;   NASAL SINUS SURGERY  1990's   right   Social History:  reports that he has never smoked. He has never used smokeless tobacco. He reports that he does not drink  alcohol and does not use drugs.  Allergies  Allergen Reactions   Ace Inhibitors Swelling and Other (See Comments)    Kidney issues; angioedema per PMH in chart   Penicillins Palpitations    Family History  Problem Relation Age of Onset   Heart disease Mother    Diabetes Father    Cardiomyopathy Father    Colon cancer Neg Hx     Prior to Admission medications   Medication Sig Start Date End Date Taking? Authorizing Provider  acetaminophen  (TYLENOL ) 650 MG CR tablet Take 1,300 mg by mouth every 8 (eight) hours as needed for pain.   Yes [provider]  carvedilol  (COREG ) 12.5 MG tablet Take 1 tablet (12.5 mg total) by mouth 2 (two) times daily with a meal. 10/18/21  Yes Milissa Tod PARAS, MD  dapagliflozin  propanediol (FARXIGA ) 10 MG TABS tablet Take 1 tablet (10 mg total) by mouth daily. 07/18/23  Yes Waddell Danelle ORN, MD  diphenhydramine-acetaminophen  (TYLENOL  PM) 25-500 MG TABS tablet Take 1 tablet by mouth at bedtime.   Yes [provider]  ELIQUIS  2.5 MG TABS tablet TAKE (1) TABLET TWICE A DAY. Patient taking differently: Take 2.5 mg by mouth 2 (two) times daily. 02/19/24  Yes Waddell Danelle ORN, MD  furosemide  (LASIX ) 40 MG tablet Take 40 mg by mouth daily. 03/09/20  Yes [provider]  gabapentin  (NEURONTIN ) 100 MG capsule Take 200-300 mg by mouth at bedtime. Takes an extra capsule if struggling to sleep.   Yes [provider]  HUMALOG KWIKPEN 100 UNIT/ML KwikPen Inject 8-10 Units into the skin 2 (two) times daily.   Yes [provider]  Insulin  Glargine (TOUJEO  SOLOSTAR) 300 UNIT/ML SOPN Inject 26 Units into the skin daily. 04/27/15  Yes [provider]  omeprazole  (PRILOSEC) 40 MG capsule Take 1 capsule (40 mg total) by mouth daily before breakfast. 02/27/20  Yes [name], [name], PA-C  rosuvastatin  (CRESTOR ) 5 MG tablet Take 5 mg by mouth daily.   Yes [provider]  sodium chloride  (OCEAN) 0.65 % SOLN nasal spray Place 1  spray into both nostrils as needed for congestion. 10/18/21  Yes Milissa Tod PARAS, MD    Physical Exam: Vitals:   03/29/24 1106  BP: 101/69  Pulse: 73  Resp: 18  Temp: (!) 97.5 F (36.4 C)  TempSrc: Oral  SpO2: 95%  Weight: 108.9 kg  Height: 6' 4 (1.93 m)   General: Elderly male. Awake and alert and oriented x3. No acute cardiopulmonary distress.  HEENT: Normocephalic atraumatic.  Right and left ears normal in appearance.  Pupils equal, round, reactive to light. Extraocular muscles are intact. Sclerae anicteric and noninjected.  Moist mucosal membranes. No mucosal lesions.  Neck: Neck supple without lymphadenopathy. No carotid bruits. No masses palpated.  Cardiovascular: Irregularly rate with normal S1-S2 sounds. No murmurs, rubs, gallops auscultated.  Increased JVD.  3+ pitting edema in lower legs bilaterally. Respiratory: Rales in bases bilaterally..  No accessory muscle use. Abdomen: Soft, nontender, nondistended. Active bowel sounds. No masses or hepatosplenomegaly  Skin: No rashes,  lesions, or ulcerations.  Dry, warm to touch. 2+ dorsalis pedis and radial pulses. Musculoskeletal: No calf or leg pain. All major joints not erythematous nontender.  No upper or lower joint deformation.  Good ROM.  No contractures  Psychiatric: Intact judgment and insight. Pleasant and cooperative. Neurologic: No focal neurological deficits. Strength is 5/5 and symmetric in upper and lower extremities.  Cranial nerves II through XII are grossly intact.   Data Reviewed: Imaging and labs reviewed by me  Assessment and Plan: No notes have been filed under this hospital service. Service: Hospitalist  Principal Problem:   Acute CHF (congestive heart failure) (HCC) Active Problems:   S/P CABG x 3   Diabetes mellitus, type 2 (HCC)   Atrial fibrillation (HCC)   CAD (coronary artery disease) of artery bypass graft   History of implantable cardioverter-defibrillator (ICD) placement   Benign essential  HTN   Chronic kidney disease, stage 3b (HCC)  Acute on chronic CHF Telemetry monitoring Strict I/O Daily Weights Diuresis: Lasix  60 mg IV twice daily Potassium: 40 mEq twice a day by mouth Echo cardiac exam tomorrow Repeat BMP tomorrow  ICD EP to evaluate patient Atrial fibrillation Rate controlled.  On Eliquis  which we will continue Coronary artery disease status post CABG x 3 vessels\ Continue Crestor   Diabetes Continue Toujeo  and Humalog, sliding scale insulin  Chronic kidney disease   Advance Care Planning:   Code Status: Prior full code confirmed by patient  Consults: EP  Family Communication: None  Severity of Illness: The appropriate patient status for this patient is INPATIENT. Inpatient status is judged to be reasonable and necessary in order to provide the required intensity of service to ensure the patient's safety. The patient's presenting symptoms, physical exam findings, and initial radiographic and laboratory data in the context of their chronic comorbidities is felt to place them at high risk for further clinical deterioration. Furthermore, it is not anticipated that the patient will be medically stable for discharge from the hospital within 2 midnights of admission.   * I certify that at the point of admission it is my clinical judgment that the patient will require inpatient hospital care spanning beyond 2 midnights from the point of admission due to high intensity of service, high risk for further deterioration and high frequency of surveillance required.*  Author: Fayetta Sorenson J Tesean Stump, DO 03/29/2024 5:14 PM  For on call review [email or web address].

## 2024-03-29 NOTE — Consult Note (Signed)
 Cardiology Consultation   Patient ID: Wayne Gordon MRN: [id number]; DOB: 04-Aug-1934  Admit date: 03/29/2024 Date of Consult: 03/29/2024  PCP:  Renato Dorothey HERO, NP   [facility] Providers Cardiologist:  Danelle Birmingham, MD   {   Patient Profile: Wayne Gordon is a 89 y.o. male with a hx of h/o CAD s/p CABG with MVR 2013, ICM with baseline EF 20-25%, VT, sinus node dysfunction s/p ICD 2013 with upgrade of Biotronik BiV ICD 09/2020, permanent atrial fibrillation, HTN, HLD, DM II and stage III CKD, spinal cord stimulator  who is being seen 03/29/2024 for the evaluation of generalized weakness, weight gain, ankle edema- CHF symptoms at the request of Dr Barbra.   History of Present Illness: Wayne Gordon is a 89 y.o. male with a hx of h/o CAD s/p CABG with MVR 2013, ICM with baseline EF 20-25%, VT, sinus node dysfunction s/p ICD 2013 with upgrade of Biotronik BiV ICD 09/2020, permanent atrial fibrillation, HTN, HLD, DM II and stage III CKD, spinal cord stimulator  who is being seen 03/29/2024 for the evaluation of generalized weakness, weight gain, ankle edema- CHF symptoms    [name]-with the above complaints of weakness, weight gain, CHF symptoms, was given IV Lasix  and transferred to [facility] health for further evaluation ER visit vitals are stable. Labs showed BNP elevation of 692, increased from before, troponin flat at 19/19, creatinine slightly increased to 2.0, baseline is 1.7-1.8, EGFR is upper 30s,. Chest x-ray shows mild diffuse pulmonary vascular congestion with bilateral pleural effusion. EKG shows V paced rhythm but underlying baseline artifact and probably underlying rhythm is A-fib, rhythm is irregular   Last echo is 2023 EF 25%, septal flattening consistent with RV pressure and volume overload, diffuse hypokinesis with basal inferior septal akinesis, RV is moderately reduced, mild MR, aortic sclerosis present  Patient reports he had complete  weakness and fall a few weeks ago while he got up from bed and was using restroom but did not lose consciousness, did not feel palpitations or chest pain or defibrillator shock.  He also has been having problems with vertigo and neck neck stiffness, today he felt similar symptoms of extreme weakness while using restroom and fatigue and almost going to fall the skin to the ER, endorses shortness of breath with exertion but no chest pain, no orthopnea, does have lower extremity edema. Reports he was called by pacemaker/EP clinic regarding something not working, unclear etiology, identified in his defibrillator interrogation was unsuccessful   Past Medical History:  Diagnosis Date   Allergy to ACE inhibitors    Angioedema many years ago; patient has tolerated Losartan  (ARB) in the past - it was held during admisson for GI bleed and worsening renal function >> resume Losartan  25 mg QD 06/2015   Arthritis    Atrial fibrillation (HCC)    PAF, CHADs2Vasc = 5   Carotid artery disease (HCC)    60-79% LICA   CHF (congestive heart failure) (HCC)    CKD (chronic kidney disease)    Coronary artery disease    Diabetes mellitus    GERD (gastroesophageal reflux disease)    Hyperlipidemia    Hypertension    ICD (implantable cardiac defibrillator) in place 03/28/2012   Biotronik, Dr. Birmingham 03/28/12   Ischemic cardiomyopathy    S/P CABG x 3; EF 25%   Mitral regurgitation    S/P mitral valve repair 2013   Myocardial infarction [facility])    Renal insufficiency  Past Surgical History:  Procedure Laterality Date   BIV UPGRADE N/A 10/12/2020   Procedure: BIV ICD UPGRADE;  Surgeon: Waddell Danelle ORN, MD;  Location: [facility] INVASIVE CV LAB;  Service: Cardiovascular;  Laterality: N/A;   CHOLECYSTECTOMY N/A 09/20/2017   Procedure: LAPAROSCOPIC SUBTOTAL CHOLECYSTECTOMY;  Surgeon: Rubin Calamity, MD;  Location: [facility] OR;  Service: General;  Laterality: N/A;   COLONOSCOPY N/A 04/24/2014   RMR Pancolonic diverticulosis    CORONARY ARTERY BYPASS GRAFT  10/17/2011   Procedure: CORONARY ARTERY BYPASS GRAFTING (CABG);  Surgeon: Sudie VEAR Laine, MD;  Location: [facility] OR;  Service: Open Heart Surgery;  Laterality: N/A;   ERCP N/A 09/29/2017   Procedure: ENDOSCOPIC RETROGRADE CHOLANGIOPANCREATOGRAPHY (ERCP);  Surgeon: Rollin Dover, MD;  Location: [facility] ENDOSCOPY;  Service: Endoscopy;  Laterality: N/A;   ESOPHAGOGASTRODUODENOSCOPY N/A 04/24/2014   RMR Subtle nodularity the gastric mucosa of uncertain significance-status post biopsy. Hiatal hernia. chronic inflammation, no H.pylori   ESOPHAGOGASTRODUODENOSCOPY N/A 06/24/2016   Dr. Shaaron: normal esophagus, small hiatal hernia, normal duodenum   ICD  03/28/2012   IMPLANTABLE CARDIOVERTER DEFIBRILLATOR IMPLANT N/A 03/28/2012   Procedure: IMPLANTABLE CARDIOVERTER DEFIBRILLATOR IMPLANT;  Surgeon: Danelle ORN Waddell, MD;  Location: [facility] CATH LAB;  Service: Cardiovascular;  Laterality: N/A;   KNEE ARTHROSCOPY  ~ 2008   right   LEFT AND RIGHT HEART CATHETERIZATION WITH CORONARY ANGIOGRAM N/A 10/12/2011   Procedure: LEFT AND RIGHT HEART CATHETERIZATION WITH CORONARY ANGIOGRAM;  Surgeon: Deatrice DELENA Cage, MD;  Location: [facility] CATH LAB;  Service: Cardiovascular;  Laterality: N/A;   MITRAL VALVE REPAIR  10/17/2011   Procedure: MITRAL VALVE REPAIR (MVR);  Surgeon: Sudie VEAR Laine, MD;  Location[facility] OR;  Service: Open Heart Surgery;  Laterality: N/A;   NASAL SINUS SURGERY  1990's   right     Home Medications:  Prior to Admission medications   Medication Sig Start Date End Date Taking? Authorizing Provider  acetaminophen  (TYLENOL ) 650 MG CR tablet Take 1,300 mg by mouth every 8 (eight) hours as needed for pain.   Yes [provider]  carvedilol  (COREG ) 12.5 MG tablet Take 1 tablet (12.5 mg total) by mouth 2 (two) times daily with a meal. 10/18/21  Yes Milissa Tod PARAS, MD  dapagliflozin  propanediol (FARXIGA ) 10 MG TABS tablet Take 1 tablet (10 mg total) by mouth daily. 07/18/23  Yes Waddell Danelle ORN, MD  diphenhydramine-acetaminophen  (TYLENOL  PM) 25-500 MG TABS tablet Take 1 tablet by mouth at bedtime.   Yes [provider]  ELIQUIS  2.5 MG TABS tablet TAKE (1) TABLET TWICE A DAY. Patient taking differently: Take 2.5 mg by mouth 2 (two) times daily. 02/19/24  Yes Waddell Danelle ORN, MD  furosemide  (LASIX ) 40 MG tablet Take 40 mg by mouth daily. 03/09/20  Yes [provider]  gabapentin  (NEURONTIN ) 100 MG capsule Take 200-300 mg by mouth at bedtime. Takes an extra capsule if struggling to sleep.   Yes [provider]  HUMALOG KWIKPEN 100 UNIT/ML KwikPen Inject 8-10 Units into the skin 2 (two) times daily.   Yes [provider]  Insulin  Glargine (TOUJEO  SOLOSTAR) 300 UNIT/ML SOPN Inject 26 Units into the skin daily. 04/27/15  Yes [provider]  omeprazole  (PRILOSEC) 40 MG capsule Take 1 capsule (40 mg total) by mouth daily before breakfast. 02/27/20  Yes [name], [name], PA-C  rosuvastatin  (CRESTOR ) 5 MG tablet Take 5 mg by mouth daily.   Yes [provider]  sodium chloride  (OCEAN) 0.65 % SOLN nasal spray Place 1 spray into  both nostrils as needed for congestion. 10/18/21  Yes Milissa Tod PARAS, MD    Scheduled Meds:  apixaban   2.5 mg Oral BID   [START ON 03/30/2024] carvedilol   12.5 mg Oral BID WC   [START ON 03/30/2024] dapagliflozin  propanediol  10 mg Oral Daily   [START ON 03/30/2024] furosemide   40 mg Intravenous BID   [START ON 03/30/2024] furosemide   60 mg Intravenous BID   gabapentin   200 mg Oral QHS   [START ON 03/30/2024] insulin  glargine (1 Unit Dial)  26 Units Subcutaneous Daily   insulin  lispro  4 Units Subcutaneous BID   [START ON 03/30/2024] pantoprazole   40 mg Oral Daily   potassium chloride   40 mEq Oral BID   [START ON 03/30/2024] rosuvastatin   5 mg Oral Daily   Continuous Infusions:  PRN Meds: ondansetron  **OR** ondansetron  (ZOFRAN ) IV, polyethylene glycol  Allergies:    Allergies  Allergen Reactions   Ace Inhibitors Swelling  and Other (See Comments)    Kidney issues; angioedema per PMH in chart   Penicillins Palpitations    Social History:   Social History   Socioeconomic History   Marital status: Married    Spouse name: Not on file   Number of children: Not on file   Years of education: Not on file   Highest education level: Not on file  Occupational History   Occupation: Retired  Tobacco Use   Smoking status: Never   Smokeless tobacco: Never  Vaping Use   Vaping status: Never Used  Substance and Sexual Activity   Alcohol use: No    Alcohol/week: 0.0 standard drinks of alcohol   Drug use: No   Sexual activity: Yes  Other Topics Concern   Not on file  Social History Narrative   Not on file   Social Drivers of Health   Financial Resource Strain: Medium Risk (09/05/2022)   Received from [facility]   Overall Financial Resource Strain (CARDIA)    Difficulty of Paying Living Expenses: Somewhat hard  Food Insecurity: No Food Insecurity (03/29/2024)   Hunger Vital Sign    Worried About Running Out of Food in the Last Year: Never true    Ran Out of Food in the Last Year: Never true  Transportation Needs: No Transportation Needs (03/29/2024)   PRAPARE - Administrator, Civil Service (Medical): No    Lack of Transportation (Non-Medical): No  Physical Activity: Unknown (09/05/2022)   Received from [facility]   Exercise Vital Sign    On average, how many days per week do you engage in moderate to strenuous exercise (like a brisk walk)?: Patient declined    Minutes of Exercise per Session: Not on file  Stress: No Stress Concern Present (09/05/2022)   Received from [facility] - Occupational Stress Questionnaire    Feeling of Stress : Only a little  Social Connections: Moderately Isolated (03/29/2024)   Social Connection and Isolation Panel    Frequency of Communication with Friends and Family: More than three times a week    Frequency of Social  Gatherings with Friends and Family: Twice a week    Attends Religious Services: Never    Database administrator or Organizations: No    Attends Banker Meetings: Never    Marital Status: Married  Catering manager Violence: Not At Risk (03/29/2024)   Humiliation, Afraid, Rape, and Kick questionnaire    Fear of Current or Ex-Partner: No  Emotionally Abused: No    Physically Abused: No    Sexually Abused: No    Family History:    Family History  Problem Relation Age of Onset   Heart disease Mother    Diabetes Father    Cardiomyopathy Father    Colon cancer Neg Hx      ROS:  Please see the history of present illness.   All other ROS reviewed and negative.     Physical Exam/Data: Vitals:   03/29/24 1600 03/29/24 2000 03/29/24 2113 03/29/24 2136  BP: 125/86 121/89 (!) 149/77   Pulse:   75   Resp:  15 (!) 22   Temp:   98.5 F (36.9 C)   TempSrc:   Oral   SpO2:   96%   Weight:    110.2 kg  Height:        Intake/Output Summary (Last 24 hours) at 03/29/2024 2205 Last data filed at 03/29/2024 2135 Gross per 24 hour  Intake --  Output 400 ml  Net -400 ml      03/29/2024    9:36 PM 03/29/2024   11:06 AM 03/06/2024    1:25 PM  Last 3 Weights  Weight (lbs) 242 lb 15.2 oz 240 lb 242 lb  Weight (kg) 110.2 kg 108.863 kg 109.77 kg     Body mass index is 29.57 kg/m.  General:  Well nourished, well developed, in no acute distress HEENT: normal Neck: JVD+ Vascular: No carotid bruits; Distal pulses 2+ bilaterally Cardiac:  normal S1, S2; irregularly irregular, mild holosystolic murmur heard  Lungs: Mild decreased breath sounds at baseline Abd: soft, nontender, no hepatomegaly  Ext: 1+ edema Musculoskeletal:  No deformities, BUE and BLE strength normal and equal Skin: warm and dry  Neuro:  CNs 2-12 intact, no focal abnormalities noted Psych:  Normal affect    Laboratory Data: High Sensitivity Troponin:   Recent Labs  Lab 03/29/24 1120 03/29/24 1302   TROPONINIHS 19* 19*     Chemistry Recent Labs  Lab 03/29/24 1120  NA 142  K 3.5  CL 103  CO2 24  GLUCOSE 101*  BUN 27*  CREATININE 2.08*  CALCIUM  8.7*  GFRNONAA 30*  ANIONGAP 15    Recent Labs  Lab 03/29/24 1120  PROT 6.7  ALBUMIN  3.7  AST 25  ALT 20  ALKPHOS 90  BILITOT 1.2   Lipids No results for input(s): CHOL, TRIG, HDL, LABVLDL, LDLCALC, CHOLHDL in the last 168 hours.  Hematology Recent Labs  Lab 03/29/24 1120  WBC 6.8  RBC 4.60  HGB 14.0  HCT 44.0  MCV 95.7  MCH 30.4  MCHC 31.8  RDW 15.6*  PLT 203   Thyroid No results for input(s): TSH, FREET4 in the last 168 hours.  BNP Recent Labs  Lab 03/29/24 1120  BNP 692.0*    DDimer No results for input(s): DDIMER in the last 168 hours.  Radiology/Studies:  DG Chest Portable 1 View Result Date: 03/29/2024 CLINICAL DATA:  Weakness.  Dizziness. EXAM: PORTABLE CHEST 1 VIEW COMPARISON:  10/15/2021. FINDINGS: Low lung volume. Mild diffuse pulmonary vascular congestion, likely accentuated by low lung volume. No frank pulmonary edema. There are probable atelectatic changes at the lung bases. There is subtle blunting of bilateral lateral costophrenic angles, which may be due to trace pleural effusions versus pleural thickening. No pneumothorax on either side. Stable cardio-mediastinal silhouette. There are surgical staples along the heart border and sternotomy wires, status post CABG (coronary artery bypass graft). There is a left  sided 3-lead pacemaker. Neurostimulator device noted overlying the lower thoracic spine region. No acute osseous abnormalities. The soft tissues are within normal limits. IMPRESSION: Mild diffuse pulmonary vascular congestion, likely accentuated by low lung volume. No frank pulmonary edema. Electronically Signed   By: Ree Molt M.D.   On: 03/29/2024 12:09     Assessment and Plan: Ac on chronic decompensated HFrEF, EF 20-25% Dizziness, fatigue, fall unclear etiology,  need to interrogate pacemaker to make sure he does not have arrhythmia Persistent Afib on Eliquis  low-dose Coreg  H/o BIV ICD Biotronik CAD s/p CABG with MVR 2013 Multiple other comorbidities: HTN, HLD, DM-2, CKD, spinal cord stimulator   Plan: -> Continue IV diuresis 40 mg twice daily for a day or so and switch to PO torsemide.  Does not look markedly volume overloaded on exam. GDMT: Unable to do ACE ARB or Arni due to CKD, continue Coreg  12.5 mg twice daily, Jardiance 10 mg daily, Crestor  5 mg daily, Eliquis  2.5 mg twice daily for A-fib --> Biotronik BiV ICD interrogation in a.m., EP team to see him in am I suspect his weakness, fatigue, dizziness may be deconditioning versus arrhythmia related- Echocardiogram in a.m. Monitor urine output, daily weights, heart failure pathway.    Risk Assessment/Risk Scores:   New [place]  Heart Association (NYHA) Functional Class NYHA Class III  CHA2DS2-VASc Score =   4      For questions or updates, please contact [facility] Please consult [email or web address] for contact info under    Signed, Grayce Bold, MD  03/29/2024 10:05 PM

## 2024-03-29 NOTE — ED Provider Notes (Signed)
 [place] EMERGENCY DEPARTMENT AT [facility] Provider Note   CSN: [id number] Arrival date & time: 03/29/24  1048     Patient presents with: Weakness and Atrial Fibrillation   Wayne Gordon is a 89 y.o. male with a history including hypertension, renal insufficiency, ischemic cardiomyopathy, mitral regurgitation, atrial fibrillation, on Eliquis  CAD and type 2 diabetes, chronic kidney disease presenting for evaluation of generalized weakness which has been present for the past several weeks but is worse today.  He describes feeling lightheaded with positional changes such as getting up to walk, feels weak when he first wakes,  slowly improves later in the day and seems to have started with his last pacemaker adjustement in June.  He denies headache, ear pain, vision changes, tinnitus, he does not describe classic vertigo although states he has had this diagnosis in the past.  He describes his whole body just gets weak, nonlocalized symptoms.  He is on Lasix  40 mg daily, took twice daily dosing yesterday due to increased ankle edema and 5 lb weight gain this week, but endorses he has not produced a lot of urine.  He denies chest pain, shortness of breath, fevers, no abdominal pain, dysuria.     The history is provided by the patient.       Prior to Admission medications   Medication Sig Start Date End Date Taking? Authorizing Provider  acetaminophen  (TYLENOL ) 650 MG CR tablet Take 1,300 mg by mouth every 8 (eight) hours as needed for pain.   Yes [provider]  carvedilol  (COREG ) 12.5 MG tablet Take 1 tablet (12.5 mg total) by mouth 2 (two) times daily with a meal. 10/18/21  Yes Milissa Tod PARAS, MD  dapagliflozin  propanediol (FARXIGA ) 10 MG TABS tablet Take 1 tablet (10 mg total) by mouth daily. 07/18/23  Yes Waddell Danelle ORN, MD  diphenhydramine-acetaminophen  (TYLENOL  PM) 25-500 MG TABS tablet Take 1 tablet by mouth at bedtime.   Yes [provider]  ELIQUIS  2.5  MG TABS tablet TAKE (1) TABLET TWICE A DAY. Patient taking differently: Take 2.5 mg by mouth 2 (two) times daily. 02/19/24  Yes Waddell Danelle ORN, MD  furosemide  (LASIX ) 40 MG tablet Take 40 mg by mouth daily. 03/09/20  Yes [provider]  gabapentin  (NEURONTIN ) 100 MG capsule Take 200-300 mg by mouth at bedtime. Takes an extra capsule if struggling to sleep.   Yes [provider]  HUMALOG  KWIKPEN 100 UNIT/ML KwikPen Inject 8-10 Units into the skin 2 (two) times daily.   Yes [provider]  Insulin  Glargine (TOUJEO  SOLOSTAR) 300 UNIT/ML SOPN Inject 26 Units into the skin daily. 04/27/15  Yes [provider]  omeprazole  (PRILOSEC) 40 MG capsule Take 1 capsule (40 mg total) by mouth daily before breakfast. 02/27/20  Yes [name] Josette RAMAN, PA-C  rosuvastatin  (CRESTOR ) 5 MG tablet Take 5 mg by mouth daily.   Yes [provider]  sodium chloride  (OCEAN) 0.65 % SOLN nasal spray Place 1 spray into both nostrils as needed for congestion. 10/18/21  Yes Milissa Tod PARAS, MD    Allergies: Ace inhibitors and Penicillins    Review of Systems  Constitutional:  Negative for chills and fever.  HENT: Negative.    Eyes: Negative.  Negative for visual disturbance.  Respiratory:  Negative for chest tightness and shortness of breath.   Cardiovascular:  Positive for leg swelling. Negative for chest pain and palpitations.  Gastrointestinal:  Negative for abdominal pain, nausea and vomiting.  Genitourinary: Negative.  Musculoskeletal:  Negative for arthralgias, joint swelling and neck pain.  Skin: Negative.  Negative for rash and wound.  Neurological:  Positive for weakness. Negative for dizziness, light-headedness, numbness and headaches.  Psychiatric/Behavioral: Negative.      Updated Vital Signs BP 101/69   Pulse 73   Temp (!) 97.5 F (36.4 C) (Oral)   Resp 18   Ht 6' 4 (1.93 m)   Wt 108.9 kg   SpO2 95%   BMI 29.21 kg/m   Physical Exam Vitals and nursing  note reviewed.  Constitutional:      Appearance: He is well-developed.  HENT:     Head: Normocephalic and atraumatic.     Mouth/Throat:     Mouth: Mucous membranes are moist.  Eyes:     Conjunctiva/sclera: Conjunctivae normal.  Cardiovascular:     Rate and Rhythm: Normal rate. Rhythm irregular.     Heart sounds: Normal heart sounds.  Pulmonary:     Effort: Pulmonary effort is normal.     Breath sounds: Normal breath sounds. No wheezing.  Abdominal:     General: Bowel sounds are normal.     Palpations: Abdomen is soft.     Tenderness: There is no abdominal tenderness.  Musculoskeletal:        General: Normal range of motion.     Cervical back: Normal range of motion.     Right lower leg: Edema present.     Left lower leg: Edema present.  Skin:    General: Skin is warm and dry.  Neurological:     General: No focal deficit present.     Mental Status: He is alert.  Psychiatric:        Mood and Affect: Mood normal.     (all labs ordered are listed, but only abnormal results are displayed) Labs Reviewed  CBC WITH DIFFERENTIAL/PLATELET - Abnormal; Notable for the following components:      Result Value   RDW 15.6 (*)    All other components within normal limits  COMPREHENSIVE METABOLIC PANEL WITH GFR - Abnormal; Notable for the following components:   Glucose, Bld 101 (*)    BUN 27 (*)    Creatinine, Ser 2.08 (*)    Calcium  8.7 (*)    GFR, Estimated 30 (*)    All other components within normal limits  URINALYSIS, ROUTINE W REFLEX MICROSCOPIC - Abnormal; Notable for the following components:   Glucose, UA 150 (*)    All other components within normal limits  BRAIN NATRIURETIC PEPTIDE - Abnormal; Notable for the following components:   B Natriuretic Peptide 692.0 (*)    All other components within normal limits  CBG MONITORING, ED - Abnormal; Notable for the following components:   Glucose-Capillary 106 (*)    All other components within normal limits  TROPONIN I (HIGH  SENSITIVITY) - Abnormal; Notable for the following components:   Troponin I (High Sensitivity) 19 (*)    All other components within normal limits  TROPONIN I (HIGH SENSITIVITY) - Abnormal; Notable for the following components:   Troponin I (High Sensitivity) 19 (*)    All other components within normal limits    EKG: EKG Interpretation Date/Time:  Friday March 29 2024 11:09:29 EDT Ventricular Rate:  75 PR Interval:    QRS Duration:  188 QT Interval:  454 QTC Calculation: 508 R Axis:   -51  Text Interpretation: artifact diffusely,  but appears to be  electronically paced Confirmed by Patsey Lot [phone]) on 03/29/2024 2:49:15 PM  Radiology: DG Chest Portable 1 View Result Date: 03/29/2024 CLINICAL DATA:  Weakness.  Dizziness. EXAM: PORTABLE CHEST 1 VIEW COMPARISON:  10/15/2021. FINDINGS: Low lung volume. Mild diffuse pulmonary vascular congestion, likely accentuated by low lung volume. No frank pulmonary edema. There are probable atelectatic changes at the lung bases. There is subtle blunting of bilateral lateral costophrenic angles, which may be due to trace pleural effusions versus pleural thickening. No pneumothorax on either side. Stable cardio-mediastinal silhouette. There are surgical staples along the heart border and sternotomy wires, status post CABG (coronary artery bypass graft). There is a left sided 3-lead pacemaker. Neurostimulator device noted overlying the lower thoracic spine region. No acute osseous abnormalities. The soft tissues are within normal limits. IMPRESSION: Mild diffuse pulmonary vascular congestion, likely accentuated by low lung volume. No frank pulmonary edema. Electronically Signed   By: Ree Molt M.D.   On: 03/29/2024 12:09     Procedures   Medications Ordered in the ED  furosemide  (LASIX ) injection 60 mg (60 mg Intravenous Given 03/29/24 1645)                                    Medical Decision Making Patient presenting with worsening  generalized weakness associated with peripheral edema and weight gain suggesting acute on chronic CHF.  Symptoms are severe today, he states they are worse when he first wakes in the morning and has fear of actually trying to stand up out of bed he is so weak he is afraid he is going to fall.  He denies vertigo but does have lightheadedness and associated with the symptoms, they started shortly after he had an his ICD reprogrammed 1 month ago.  He has noted that his heart rate is now 80-85 whereas prior to that reprogramming he was in the 70 range and wonders if his ICD is the cause of his weakness.  He denies chest pain, denies palpitations.  He does endorse having a fall more than 3 weeks ago with a weak episode.  Differential diagnosis including dehydration however he is not orthostatic on today's maneuvers.  Electrolyte disturbance, UTI or other infection, pneumonia.  His weakness is generalized, not localized, doubt primarily a neurologic event.  Amount and/or Complexity of Data Reviewed Labs: ordered.    Details: Significant labs including a BNP of 692, he has a flat delta troponin at 19, urinalysis is negative except for glucose urea of 150, his CMET is relatively stable, he does have a creatinine of 2.08 which is near his normal for his renal insufficiency.  CBC is normal, normal WBC count and hemoglobin of 14.0 Radiology: ordered.    Details: Chest x-ray showing mild pulmonary vascular congestion Discussion of management or test interpretation with external provider(s): Patient discussed with Dr. Mallipeddi,  specifically we discussed patient's increased weakness, which seems to be worsening since he had his ICD reprogrammed approximately 4 weeks ago.  He also has increased peripheral edema, denies shortness of breath orthopnea.  He has increased his Lasix  to 40 mg twice daily x 1 day, without significant diuresis.  She recommends admission to [facility] for diuresis but also to be evaluated by EP given  his symptoms started after his recent ICD programming.  We are working on getting his ICD interrogated currently.  Call placed to Dr. Barbra who accepts patient for admission/transfer to [facility].  Call placed to hospitalist for admission.  Risk Prescription drug management. Decision regarding  hospitalization.        Final diagnoses:  Weakness  Acute on chronic congestive heart failure, unspecified heart failure type [facility])    ED Discharge Orders     None          Luqman Perrelli, PA-C 03/29/24 1731    Patsey Lot, MD 03/30/24 [phone]

## 2024-03-30 ENCOUNTER — Inpatient Hospital Stay (HOSPITAL_COMMUNITY)

## 2024-03-30 DIAGNOSIS — I5023 Acute on chronic systolic (congestive) heart failure: Secondary | ICD-10-CM

## 2024-03-30 DIAGNOSIS — I4892 Unspecified atrial flutter: Secondary | ICD-10-CM

## 2024-03-30 DIAGNOSIS — I5021 Acute systolic (congestive) heart failure: Secondary | ICD-10-CM | POA: Diagnosis not present

## 2024-03-30 LAB — ECHOCARDIOGRAM COMPLETE
Height: 76 in
MV VTI: 1.34 cm2
S' Lateral: 4.8 cm
Weight: 3887.15 [oz_av]

## 2024-03-30 LAB — GLUCOSE, CAPILLARY
Glucose-Capillary: 69 mg/dL — ABNORMAL LOW (ref 70–99)
Glucose-Capillary: 102 mg/dL — ABNORMAL HIGH (ref 70–99)
Glucose-Capillary: 143 mg/dL — ABNORMAL HIGH (ref 70–99)
Glucose-Capillary: 156 mg/dL — ABNORMAL HIGH (ref 70–99)
Glucose-Capillary: 142 mg/dL — ABNORMAL HIGH (ref 70–99)
Glucose-Capillary: 128 mg/dL — ABNORMAL HIGH (ref 70–99)

## 2024-03-30 LAB — BASIC METABOLIC PANEL WITH GFR
Anion gap: 12 (ref 5–15)
BUN: 22 mg/dL (ref 8–23)
CO2: 31 mmol/L (ref 22–32)
Calcium: 9.1 mg/dL (ref 8.9–10.3)
Chloride: 98 mmol/L (ref 98–111)
Creatinine, Ser: 1.92 mg/dL — ABNORMAL HIGH (ref 0.61–1.24)
GFR, Estimated: 33 mL/min — ABNORMAL LOW (ref >60)
Glucose, Bld: 129 mg/dL — ABNORMAL HIGH (ref 70–99)
Potassium: 3.7 mmol/L (ref 3.5–5.1)
Sodium: 141 mmol/L (ref 135–145)

## 2024-03-30 LAB — PHOSPHORUS: Phosphorus: 3.5 mg/dL (ref 2.5–4.6)

## 2024-03-30 LAB — MAGNESIUM: Magnesium: 2.1 mg/dL (ref 1.7–2.4)

## 2024-03-30 LAB — TSH: TSH: 3.792 u[IU]/mL (ref 0.350–4.500)

## 2024-03-30 MED ORDER — AMIODARONE HCL 200 MG PO TABS
400.0000 mg | ORAL_TABLET | Freq: Two times a day (BID) | ORAL | Status: DC
  Administered 2024-03-30 – 2024-04-01 (×4): 400 mg via ORAL
  Filled 2024-03-30 (×4): qty 2

## 2024-03-30 MED ORDER — AMIODARONE HCL 200 MG PO TABS: 200.0000 mg | ORAL_TABLET | Freq: Every day | ORAL | Status: DC

## 2024-03-30 MED ORDER — AMIODARONE HCL 200 MG PO TABS: 200.0000 mg | ORAL_TABLET | Freq: Two times a day (BID) | ORAL | Status: DC

## 2024-03-30 MED ORDER — PERFLUTREN LIPID MICROSPHERE
1.0000 mL | INTRAVENOUS | Status: AC | PRN
  Administered 2024-03-30: 3 mL via INTRAVENOUS

## 2024-03-30 MED ORDER — MELATONIN 5 MG PO TABS
5.0000 mg | ORAL_TABLET | Freq: Every evening | ORAL | Status: DC | PRN
  Administered 2024-03-30 – 2024-03-31 (×2): 5 mg via ORAL
  Filled 2024-03-30 (×2): qty 1

## 2024-03-30 NOTE — Plan of Care (Signed)
 Pt is still 2+ pitting edema on legs, progressing ok, GFR 33, and is fairly mobile, with a standby assist, seems able to ambulate indept.

## 2024-03-30 NOTE — Progress Notes (Signed)
  Progress Note  Patient Name: Wayne Gordon Date of Encounter: 03/30/2024 [facility] Cardiologist: Danelle Birmingham, MD   Interval Summary   No acute overnight events. Patient reports feeling relatively well. No new or acute complaints.   Vital Signs Vitals:   03/30/24 0327 03/30/24 0357 03/30/24 0754 03/30/24 1144  BP:   106/66 120/80  Pulse:   70 82  Resp: 20  18 18   Temp:   98 F (36.7 C) (!) 97 F (36.1 C)  TempSrc:   Oral Oral  SpO2:   95% 95%  Weight:  110.2 kg    Height:        Intake/Output Summary (Last 24 hours) at 03/30/2024 1345 Last data filed at 03/30/2024 1146 Gross per 24 hour  Intake 600 ml  Output 3450 ml  Net -2850 ml      03/30/2024    3:57 AM 03/29/2024    9:36 PM 03/29/2024   11:06 AM  Last 3 Weights  Weight (lbs) 242 lb 15.2 oz 242 lb 15.2 oz 240 lb  Weight (kg) 110.2 kg 110.2 kg 108.863 kg      Telemetry/ECG  VP - Personally Reviewed  Physical Exam  General: Well developed, in no acute distress.  Neck: No JVD.  Cardiac: Normal rate, regular rhythm.  Resp: Normal work of breathing.  Ext: 1+ edema.  Neuro: No gross focal deficits.  Psych: Normal affect.   Assessment & Plan  Wayne Gordon is a 89 y.o. male with a hx of h/o CAD s/p CABG with MVR 2013, ICM with baseline EF 20-25%, VT, sinus node dysfunction s/p ICD 2013 with upgrade of Bi[employer]ron[employer] BiV ICD 09/2020, persistent atrial fibrillation, HTN, HLD, DM II and stage III CKD, spinal cord stimulator who presented with fatigue and weight gain, LE edema, consistent with ADHF. Appears to have gone into atrial flutter since last clinic visit on 7/9 based on device interrogation. I suspect this is the etiology of his current symptoms and decompensation.   #. Persistent atrial fib/flutter #. S/p BiV ICD #. Acute on chronic systolic heart failure 2/2 ICM #. CAD s/p CABG #. HTN #. HLD #. DMII #. CKD stage III  Plan:  - Bedside device interrogation performed today with appropriate device  function and stable lead parameters. Underlying rhythm AFL.  - Anticipate cardioversion on Monday. - Start oral amiodarone  load - 400mg  BID x7d, then 200mg  BID x 7d, then 200mg  daily thereafter. - Continue IV diuresis with Lasix  40mg  for another day. Reassess tomorrow.  - Continue home GDMT regimen.   For questions or updates, please contact [facility] Please consult [email or web address] for contact info under       Signed, Fonda Kitty, MD

## 2024-03-30 NOTE — Progress Notes (Addendum)
 Progress Note   Patient: Wayne Gordon FMW:[id number] DOB: 1934-06-13 DOA: 03/29/2024     1 DOS: the patient was seen and examined on 03/30/2024   Brief hospital course: 89 year old man with PMH of hypertension, stage III chronic kidney disease, diabetes, ischemic cardiomyopathy, atrial fibrillation on Eliquis , heart failure with reduced EF of 25%, ICD/pacer present who presented with weakness that has been progressive since recent adjustment to ICD.  Patient was unable to get out of bed on 8/1 and presented to hospital. Patient has been seen by cardiology/EP and ICD has been interrogated.  Patient found to have atrial flutter.  EP planning on cardioversion on 8/4.  Assessment and Plan:  Weakness Patient presented with weakness which has been progressive since recent ICD adjustment. Hemoglobin 14.0. Troponin 19 >19 Potassium 3.5 on presentation.  Magnesium  2.1.  TSH 3.79.  Creatinine at baseline. Cardiology was consulted.  Patient has been seen by EP.  Input appreciated. ICD interrogated.  Patient with atrial flutter, felt to be the etiology of his current symptoms. - Will start oral amiodarone  load 400mg  BID x7d, then 200mg  BID x 7d, then 200mg  daily thereafter per cardiology recommendations. - Will continue IV diuresis. - N.p.o. Sunday night for possible cardioversion on Monday, 8/4.  HFrEF s/p ICD with exacerbation TTE done 8/2 shows EF 20 to 25%, unchanged from prior (February 2023). Patient has been seen by cardiology/EP as indicated above. - Will continue GDMT including Coreg , Farxiga ,. - Will continue IV diuresis  Atrial fibrillation Per interrogation, patient in atrial flutter. - Started on amiodarone  as indicated above. - Continue Eliquis .  CAD s/p CABG - Continue home Crestor .  T2DM On glargine insulin , sliding scale insulin  at home. - Diabetic diet. - Continue glargine, sliding scale insulin .  Possible vertigo Patient says he has had vertigo on and off for several  months. He would like it to be treated. I explained to him that I would like to avoid additional medications as much as possible. - PT vestibular evaluation ordered.     Subjective: Patient states he is feeling better today.  He reports that his symptoms appear to be worse in the mornings.  He feels much better today, though.  Physical Exam: Vitals:   03/30/24 0327 03/30/24 0357 03/30/24 0754 03/30/24 1144  BP:   106/66 120/80  Pulse:   70 82  Resp: 20  18 18   Temp:   98 F (36.7 C) (!) 97 F (36.1 C)  TempSrc:   Oral Oral  SpO2:   95% 95%  Weight:  110.2 kg    Height:       Physical Exam   General: Alert, oriented X3  Eyes: Pupils equal, reactive  Oral cavity: moist mucous membranes  Head: Atraumatic, normocephalic  Neck: supple  Chest: clear to auscultation. No crackles, no wheezes  CVS: S1,S2 RRR. No murmurs  Abd: No distention, soft, non-tender. No masses palpable  Extr: Pedal edema MSK: No joint deformities or swelling  Neurological: Grossly intact.    Data Reviewed:     Latest Ref Rng & Units 03/29/2024   11:20 AM 10/18/2021    4:45 AM 10/17/2021    4:31 AM  CBC  WBC 4.0 - 10.5 K/uL 6.8  7.3  5.8   Hemoglobin 13.0 - 17.0 g/dL 85.9  87.0  86.9   Hematocrit 39.0 - 52.0 % 44.0  39.2  38.5   Platelets 150 - 400 K/uL 203  143  121       Latest Ref  Rng & Units 03/30/2024   11:14 AM 03/29/2024   11:20 AM 10/18/2021    4:45 AM  BMP  Glucose 70 - 99 mg/dL 870  898  843   BUN 8 - 23 mg/dL 22  27  46   Creatinine 0.61 - 1.24 mg/dL 8.07  7.91  8.06   Sodium 135 - 145 mmol/L 141  142  134   Potassium 3.5 - 5.1 mmol/L 3.7  3.5  3.7   Chloride 98 - 111 mmol/L 98  103  94   CO2 22 - 32 mmol/L 31  24  31    Calcium  8.9 - 10.3 mg/dL 9.1  8.7  8.7      Family Communication: n/a  Disposition: Status is: Inpatient Remains inpatient appropriate because: Patient in atrial flutter and CHF exacerbation and requires IV diuretics, rhythm control, cardioversion.  Planned  Discharge Destination: Home    Time spent: 50 minutes  Author: MDALA-GAUSI, Erinn Mendosa AGATHA, MD 03/30/2024 1:30 PM  For on call review [email or web address].

## 2024-03-30 NOTE — Progress Notes (Signed)
 Mobility Specialist Progress Note:   03/30/24 1020  Mobility  Activity Ambulated with assistance  Level of Assistance Contact guard assist, steadying assist  Assistive Device Front wheel walker  Distance Ambulated (ft) 300 ft  Activity Response Tolerated well  Mobility Referral Yes  Mobility visit 1 Mobility  Mobility Specialist Start Time (ACUTE ONLY) 1020  Mobility Specialist Stop Time (ACUTE ONLY) 1040  Mobility Specialist Time Calculation (min) (ACUTE ONLY) 20 min   Pt agreeable to mobility session. Required CGA d/t minor unsteadiness at times. Required cues for posture and RW proximity. C/o bilat knee weakness, denies dizziness throughout. Back in chair with all needs met.   Therisa Rana Mobility Specialist Please contact via SecureChat or  Rehab office at [phone]

## 2024-03-30 NOTE — Plan of Care (Signed)

## 2024-03-30 NOTE — Progress Notes (Signed)
 Echocardiogram 2D Echocardiogram has been performed.  Damien FALCON Renda Pohlman RDCS 03/30/2024, 10:16 AM

## 2024-03-30 NOTE — Progress Notes (Signed)
 Dressing was soiled w/ old blood, minor wet blood when dressing replaced.

## 2024-03-31 DIAGNOSIS — I5021 Acute systolic (congestive) heart failure: Secondary | ICD-10-CM | POA: Diagnosis not present

## 2024-03-31 LAB — BASIC METABOLIC PANEL WITH GFR
Anion gap: 11 (ref 5–15)
BUN: 23 mg/dL (ref 8–23)
CO2: 31 mmol/L (ref 22–32)
Calcium: 9.1 mg/dL (ref 8.9–10.3)
Chloride: 97 mmol/L — ABNORMAL LOW (ref 98–111)
Creatinine, Ser: 2.03 mg/dL — ABNORMAL HIGH (ref 0.61–1.24)
GFR, Estimated: 31 mL/min — ABNORMAL LOW (ref >60)
Glucose, Bld: 68 mg/dL — ABNORMAL LOW (ref 70–99)
Potassium: 3.8 mmol/L (ref 3.5–5.1)
Sodium: 139 mmol/L (ref 135–145)

## 2024-03-31 LAB — GLUCOSE, CAPILLARY
Glucose-Capillary: 89 mg/dL (ref 70–99)
Glucose-Capillary: 206 mg/dL — ABNORMAL HIGH (ref 70–99)
Glucose-Capillary: 259 mg/dL — ABNORMAL HIGH (ref 70–99)
Glucose-Capillary: 130 mg/dL — ABNORMAL HIGH (ref 70–99)

## 2024-03-31 LAB — MAGNESIUM: Magnesium: 2 mg/dL (ref 1.7–2.4)

## 2024-03-31 MED ORDER — FUROSEMIDE 40 MG PO TABS
40.0000 mg | ORAL_TABLET | Freq: Every day | ORAL | Status: DC
  Administered 2024-04-01: 40 mg via ORAL
  Filled 2024-03-31: qty 1

## 2024-03-31 NOTE — Plan of Care (Signed)
  Problem: Education: Goal: Knowledge of General Education information will improve Description: Including pain rating scale, medication(s)/side effects and non-pharmacologic comfort measures Outcome: Progressing   Problem: Health Behavior/Discharge Planning: Goal: Ability to manage health-related needs will improve Outcome: Progressing   Problem: Clinical Measurements: Goal: Ability to maintain clinical measurements within normal limits will improve Outcome: Progressing Goal: Will remain free from infection Outcome: Progressing Goal: Diagnostic test results will improve Outcome: Progressing Goal: Respiratory complications will improve Outcome: Progressing Goal: Cardiovascular complication will be avoided Outcome: Progressing   Problem: Activity: Goal: Risk for activity intolerance will decrease Outcome: Progressing   Problem: Nutrition: Goal: Adequate nutrition will be maintained Outcome: Progressing   Problem: Coping: Goal: Level of anxiety will decrease Outcome: Progressing   Problem: Elimination: Goal: Will not experience complications related to bowel motility Outcome: Progressing Goal: Will not experience complications related to urinary retention Outcome: Progressing   Problem: Safety: Goal: Ability to remain free from injury will improve Outcome: Progressing   Problem: Skin Integrity: Goal: Risk for impaired skin integrity will decrease Outcome: Progressing   Problem: Education: Goal: Ability to demonstrate management of disease process will improve Outcome: Progressing Goal: Ability to verbalize understanding of medication therapies will improve Outcome: Progressing Goal: Individualized Educational Video(s) Outcome: Progressing   Problem: Activity: Goal: Capacity to carry out activities will improve Outcome: Progressing   Problem: Cardiac: Goal: Ability to achieve and maintain adequate cardiopulmonary perfusion will improve Outcome: Progressing    Problem: Education: Goal: Ability to describe self-care measures that may prevent or decrease complications (Diabetes Survival Skills Education) will improve Outcome: Progressing Goal: Individualized Educational Video(s) Outcome: Progressing   Problem: Coping: Goal: Ability to adjust to condition or change in health will improve Outcome: Progressing   Problem: Fluid Volume: Goal: Ability to maintain a balanced intake and output will improve Outcome: Progressing   Problem: Health Behavior/Discharge Planning: Goal: Ability to identify and utilize available resources and services will improve Outcome: Progressing Goal: Ability to manage health-related needs will improve Outcome: Progressing   Problem: Metabolic: Goal: Ability to maintain appropriate glucose levels will improve Outcome: Progressing   Problem: Nutritional: Goal: Maintenance of adequate nutrition will improve Outcome: Progressing Goal: Progress toward achieving an optimal weight will improve Outcome: Progressing   Problem: Skin Integrity: Goal: Risk for impaired skin integrity will decrease Outcome: Progressing   Problem: Tissue Perfusion: Goal: Adequacy of tissue perfusion will improve Outcome: Progressing   

## 2024-03-31 NOTE — Progress Notes (Signed)
 PT Cancellation Note  Patient Details Name: Wayne Gordon MRN: [id number] DOB: [birth year]   Cancelled Treatment:    Reason Eval/Treat Not Completed: Patient at procedure or test/unavailable  Patient starting his lunch. Will return shortly for vestibular eval.    Wayne Gordon, PT Acute Rehabilitation Services  Office [phone]  Wayne Gordon 03/31/2024, 12:12 PM

## 2024-03-31 NOTE — Progress Notes (Signed)
 Progress Note   Patient: Wayne Gordon DOB: [birth year] DOA: 03/29/2024     2 DOS: the patient was seen and examined on 03/31/2024   Brief hospital course: 89 year old man with PMH of hypertension, stage III chronic kidney disease, diabetes, ischemic cardiomyopathy, atrial fibrillation on Eliquis , heart failure with reduced EF of 25%, ICD/pacer present who presented with weakness that has been progressive since recent adjustment to ICD.  Patient was unable to get out of bed on 8/1 and presented to hospital. Patient has been seen by cardiology/EP and ICD has been interrogated.  Patient found to have atrial flutter.  EP planning on cardioversion on 8/4.  Assessment and Plan:  Weakness Patient presented with weakness which has been progressive since recent ICD adjustment. Hemoglobin 14.0. Troponin 19 >19 Potassium 3.5 on presentation.  Magnesium  2.1.  TSH 3.79.  Creatinine at baseline. Cardiology was consulted.  Patient has been seen by EP.  Input appreciated. ICD interrogated.  Patient with atrial flutter, felt to be the etiology of his current symptoms. - Started on oral amiodarone  load 400mg  BID x7d, then 200mg  BID x 7d, then 200mg  daily thereafter per cardiology recommendations. - Will continue IV diuresis. - N.p.o. Sunday night for possible cardioversion on Monday, 8/4.  HFrEF s/p ICD with exacerbation TTE done 8/2 shows EF 20 to 25%, unchanged from prior (February 2023). Patient has been seen by cardiology/EP as indicated above. - Will continue GDMT including Coreg , Farxiga ,. - Will continue IV diuresis  Atrial fibrillation Per interrogation, patient in atrial flutter. - Started on amiodarone  as indicated above. - Continue Eliquis .  CAD s/p CABG - Continue home Crestor .  T2DM On glargine insulin , sliding scale insulin  at home. - Diabetic diet. - Continue glargine, sliding scale insulin .  Possible vertigo Patient says he has had vertigo on and off for several  months. He would like it to be treated. I explained to him that I would like to avoid additional medications as much as possible. - PT vestibular evaluation ordered.     Subjective: Patient complains of constipation.  Physical Exam: Vitals:   03/31/24 0555 03/31/24 0736 03/31/24 0800 03/31/24 1229  BP: 109/68 102/63  99/64  Pulse: 85 82  80  Resp: 13 17    Temp: 98.3 F (36.8 C) 98.2 F (36.8 C) 98 F (36.7 C) 97.7 F (36.5 C)  TempSrc: Oral Oral Oral Oral  SpO2: 97%   96%  Weight: 106.3 kg     Height:       Physical Exam   General: Alert, oriented X3  Eyes: Pupils equal, reactive  Oral cavity: moist mucous membranes  Head: Atraumatic, normocephalic  Neck: supple  Chest: clear to auscultation. No crackles, no wheezes  CVS: S1,S2 RRR. No murmurs  Abd: No distention, soft, non-tender. No masses palpable  Extr: Pedal edema MSK: No joint deformities or swelling  Neurological: Grossly intact.    Data Reviewed:     Latest Ref Rng & Units 03/29/2024   11:20 AM 10/18/2021    4:45 AM 10/17/2021    4:31 AM  CBC  WBC 4.0 - 10.5 K/uL 6.8  7.3  5.8   Hemoglobin 13.0 - 17.0 g/dL 85.9  87.0  86.9   Hematocrit 39.0 - 52.0 % 44.0  39.2  38.5   Platelets 150 - 400 K/uL 203  143  121       Latest Ref Rng & Units 03/31/2024    2:25 AM 03/30/2024   11:14 AM 03/29/2024   11:20  AM  BMP  Glucose 70 - 99 mg/dL 68  870  898   BUN 8 - 23 mg/dL 23  22  27    Creatinine 0.61 - 1.24 mg/dL 7.96  8.07  7.91   Sodium 135 - 145 mmol/L 139  141  142   Potassium 3.5 - 5.1 mmol/L 3.8  3.7  3.5   Chloride 98 - 111 mmol/L 97  98  103   CO2 22 - 32 mmol/L 31  31  24    Calcium  8.9 - 10.3 mg/dL 9.1  9.1  8.7      Family Communication: n/a  Disposition: Status is: Inpatient Remains inpatient appropriate because: Patient in atrial flutter and CHF exacerbation and requires IV diuretics, rhythm control, cardioversion.  Planned Discharge Destination: Home DVT PPx: On systemic anticoagulation with  apixaban      Time spent: 40 minutes  Author: MDALA-GAUSI, Javian Nudd AGATHA, MD 03/31/2024 12:47 PM  For on call review [email or web address].

## 2024-03-31 NOTE — Progress Notes (Signed)
 Mobility Specialist Progress Note:   03/31/24 1045  Mobility  Activity Ambulated with assistance  Level of Assistance Contact guard assist, steadying assist  Assistive Device Front wheel walker  Distance Ambulated (ft) 300 ft  Activity Response Tolerated well  Mobility Referral Yes  Mobility visit 1 Mobility  Mobility Specialist Start Time (ACUTE ONLY) 1045  Mobility Specialist Stop Time (ACUTE ONLY) 1100  Mobility Specialist Time Calculation (min) (ACUTE ONLY) 15 min   Pt agreeable to mobility session. Required only minG assist throughout session and cues for RW proximity. No c/o throughout. Left sitting in chair with all needs met.   Therisa Rana Mobility Specialist Please contact via SecureChat or  Rehab office at [phone]

## 2024-03-31 NOTE — Plan of Care (Signed)
  Problem: Activity: Goal: Risk for activity intolerance will decrease Outcome: Completed/Met   Problem: Coping: Goal: Level of anxiety will decrease Outcome: Completed/Met   Problem: Elimination: Goal: Will not experience complications related to urinary retention Outcome: Completed/Met
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
## 2024-03-31 NOTE — Progress Notes (Signed)
 Informed Consent   Shared Decision Making/Informed Consent{  The risks (stroke, cardiac arrhythmias rarely resulting in the need for a temporary or permanent pacemaker, skin irritation or burns and complications associated with conscious sedation including aspiration, arrhythmia, respiratory failure and death), benefits (restoration of normal sinus rhythm) and alternatives of a direct current cardioversion were explained in detail to Wayne Gordon and he agrees to proceed.      Dr. Kennyth discussed

## 2024-03-31 NOTE — Progress Notes (Signed)
  Progress Note  Patient Name: Wayne Gordon Date of Encounter: 03/31/2024 [facility] Cardiologist: Danelle Birmingham, MD   Interval Summary   No acute overnight events. Patient reports feeling relatively well. No new or acute complaints.   Vital Signs Vitals:   03/31/24 0555 03/31/24 0736 03/31/24 0800 03/31/24 1229  BP: 109/68 102/63  99/64  Pulse: 85 82  80  Resp: 13 17    Temp: 98.3 F (36.8 C) 98.2 F (36.8 C) 98 F (36.7 C) 97.7 F (36.5 C)  TempSrc: Oral Oral Oral Oral  SpO2: 97%   96%  Weight: 106.3 kg     Height:        Intake/Output Summary (Last 24 hours) at 03/31/2024 1301 Last data filed at 03/31/2024 1233 Gross per 24 hour  Intake 700 ml  Output 2950 ml  Net -2250 ml      03/31/2024    5:55 AM 03/30/2024    3:57 AM 03/29/2024    9:36 PM  Last 3 Weights  Weight (lbs) 234 lb 4.8 oz 242 lb 15.2 oz 242 lb 15.2 oz  Weight (kg) 106.278 kg 110.2 kg 110.2 kg      Telemetry/ECG  VP - Personally Reviewed  Physical Exam  General: Well developed, in no acute distress.  Neck: No JVD.  Cardiac: Normal rate, regular rhythm.  Resp: Normal work of breathing.  Ext: Trace edema.  Neuro: No gross focal deficits.  Psych: Normal affect.   Assessment & Plan  Wayne Gordon is a 89 y.o. male with a hx of h/o CAD s/p CABG with MVR 2013, ICM with baseline EF 20-25%, VT, sinus node dysfunction s/p ICD 2013 with upgrade of Biotronik BiV ICD 09/2020, persistent atrial fibrillation, HTN, HLD, DM II and stage III CKD, spinal cord stimulator who presented with fatigue and weight gain, LE edema, consistent with ADHF. Appears to have gone into atrial flutter since last clinic visit on 7/9 based on device interrogation. I suspect this is the etiology of his current symptoms and decompensated heart failure. Given his degree of LV dysfunction, I think that maintenance of sinus rhythm is important moving forward.  #. Persistent atrial fib/flutter #. S/p BiV ICD #. Acute on chronic  systolic heart failure 2/2 ICM #. CAD s/p CABG #. HTN #. HLD #. DMII #. CKD stage III  Plan:  - Bedside device interrogation performed yesterday with appropriate device function and stable lead parameters. Underlying rhythm AFL.  - Cardioversion tomorrow. NPO after midnight.  - Start oral amiodarone  load - 400mg  BID x7d, then 200mg  BID x 7d, then 200mg  daily thereafter. - Transition to home diuretics.  - Continue home GDMT regimen.   For questions or updates, please contact [facility] Please consult [email or web address] for contact info under       Signed, Fonda Kitty, MD

## 2024-03-31 NOTE — Progress Notes (Signed)
 Pharmacist Heart Failure Core Measure Documentation  Assessment: Wayne Gordon has an EF documented as 20-25% on 03/30/2024 by echo.  Rationale: Heart failure patients with left ventricular systolic dysfunction (LVSD) and an EF < 40% should be prescribed an angiotensin converting enzyme inhibitor (ACEI) or angiotensin receptor blocker (ARB) at discharge unless a contraindication is documented in the medical record.  This patient is not currently on an ACEI or ARB for HF.  This note is being placed in the record in order to provide documentation that a contraindication to the use of these agents is present for this encounter.  ACE Inhibitor or Angiotensin Receptor Blocker is contraindicated (specify all that apply)  []   ACEI allergy AND ARB allergy [x]   Angioedema []   Moderate or severe aortic stenosis []   Hyperkalemia [x]   Hypotension []   Renal artery stenosis [x]   Worsening renal function, preexisting renal disease or dysfunction   Mendel Barter 03/31/2024 2:35 PM

## 2024-03-31 NOTE — H&P (View-Only) (Signed)
  Progress Note  Patient Name: Wayne Gordon Date of Encounter: 03/31/2024 [facility] Cardiologist: Danelle Birmingham, MD   Interval Summary   No acute overnight events. Patient reports feeling relatively well. No new or acute complaints.   Vital Signs Vitals:   03/31/24 0555 03/31/24 0736 03/31/24 0800 03/31/24 1229  BP: 109/68 102/63  99/64  Pulse: 85 82  80  Resp: 13 17    Temp: 98.3 F (36.8 C) 98.2 F (36.8 C) 98 F (36.7 C) 97.7 F (36.5 C)  TempSrc: Oral Oral Oral Oral  SpO2: 97%   96%  Weight: 106.3 kg     Height:        Intake/Output Summary (Last 24 hours) at 03/31/2024 1301 Last data filed at 03/31/2024 1233 Gross per 24 hour  Intake 700 ml  Output 2950 ml  Net -2250 ml      03/31/2024    5:55 AM 03/30/2024    3:57 AM 03/29/2024    9:36 PM  Last 3 Weights  Weight (lbs) 234 lb 4.8 oz 242 lb 15.2 oz 242 lb 15.2 oz  Weight (kg) 106.278 kg 110.2 kg 110.2 kg      Telemetry/ECG  VP - Personally Reviewed  Physical Exam  General: Well developed, in no acute distress.  Neck: No JVD.  Cardiac: Normal rate, regular rhythm.  Resp: Normal work of breathing.  Ext: Trace edema.  Neuro: No gross focal deficits.  Psych: Normal affect.   Assessment & Plan  Wayne Gordon is a 89 y.o. male with a hx of h/o CAD s/p CABG with MVR 2013, ICM with baseline EF 20-25%, VT, sinus node dysfunction s/p ICD 2013 with upgrade of Biotronik BiV ICD 09/2020, persistent atrial fibrillation, HTN, HLD, DM II and stage III CKD, spinal cord stimulator who presented with fatigue and weight gain, LE edema, consistent with ADHF. Appears to have gone into atrial flutter since last clinic visit on 7/9 based on device interrogation. I suspect this is the etiology of his current symptoms and decompensated heart failure. Given his degree of LV dysfunction, I think that maintenance of sinus rhythm is important moving forward.  #. Persistent atrial fib/flutter #. S/p BiV ICD #. Acute on chronic  systolic heart failure 2/2 ICM #. CAD s/p CABG #. HTN #. HLD #. DMII #. CKD stage III  Plan:  - Bedside device interrogation performed yesterday with appropriate device function and stable lead parameters. Underlying rhythm AFL.  - Cardioversion tomorrow. NPO after midnight.  - Start oral amiodarone  load - 400mg  BID x7d, then 200mg  BID x 7d, then 200mg  daily thereafter. - Transition to home diuretics.  - Continue home GDMT regimen.   For questions or updates, please contact [facility] Please consult [email or web address] for contact info under       Signed, Fonda Kitty, MD

## 2024-04-01 ENCOUNTER — Other Ambulatory Visit (HOSPITAL_COMMUNITY): Payer: Self-pay

## 2024-04-01 ENCOUNTER — Inpatient Hospital Stay (HOSPITAL_COMMUNITY): Admitting: Anesthesiology

## 2024-04-01 ENCOUNTER — Encounter (HOSPITAL_COMMUNITY)
Admission: EM | Disposition: A | Discharge status: Home / Self Care | Payer: Self-pay | Source: Home / Self Care | Attending: Internal Medicine

## 2024-04-01 ENCOUNTER — Encounter (HOSPITAL_COMMUNITY): Payer: Self-pay | Admitting: Cardiology

## 2024-04-01 DIAGNOSIS — I130 Hypertensive heart and chronic kidney disease with heart failure and stage 1 through stage 4 chronic kidney disease, or unspecified chronic kidney disease: Secondary | ICD-10-CM

## 2024-04-01 DIAGNOSIS — I4892 Unspecified atrial flutter: Secondary | ICD-10-CM

## 2024-04-01 DIAGNOSIS — I5023 Acute on chronic systolic (congestive) heart failure: Secondary | ICD-10-CM

## 2024-04-01 DIAGNOSIS — N183 Chronic kidney disease, stage 3 unspecified: Secondary | ICD-10-CM

## 2024-04-01 DIAGNOSIS — I480 Paroxysmal atrial fibrillation: Secondary | ICD-10-CM | POA: Diagnosis not present

## 2024-04-01 DIAGNOSIS — I4819 Other persistent atrial fibrillation: Secondary | ICD-10-CM

## 2024-04-01 HISTORY — PX: CARDIOVERSION: EP1203

## 2024-04-01 LAB — BASIC METABOLIC PANEL WITH GFR
Anion gap: 10 (ref 5–15)
BUN: 25 mg/dL — ABNORMAL HIGH (ref 8–23)
CO2: 31 mmol/L (ref 22–32)
Calcium: 9 mg/dL (ref 8.9–10.3)
Chloride: 96 mmol/L — ABNORMAL LOW (ref 98–111)
Creatinine, Ser: 2.14 mg/dL — ABNORMAL HIGH (ref 0.61–1.24)
GFR, Estimated: 29 mL/min — ABNORMAL LOW (ref >60)
Glucose, Bld: 99 mg/dL (ref 70–99)
Potassium: 5 mmol/L (ref 3.5–5.1)
Sodium: 137 mmol/L (ref 135–145)

## 2024-04-01 LAB — GLUCOSE, CAPILLARY
Glucose-Capillary: 124 mg/dL — ABNORMAL HIGH (ref 70–99)
Glucose-Capillary: 160 mg/dL — ABNORMAL HIGH (ref 70–99)

## 2024-04-01 LAB — HEMOGLOBIN A1C
Hgb A1c MFr Bld: 7.7 % — ABNORMAL HIGH (ref 4.8–5.6)
Mean Plasma Glucose: 174 mg/dL

## 2024-04-01 LAB — MAGNESIUM: Magnesium: 2.2 mg/dL (ref 1.7–2.4)

## 2024-04-01 SURGERY — CARDIOVERSION (CATH LAB)
Anesthesia: General

## 2024-04-01 MED ORDER — ETOMIDATE 2 MG/ML IV SOLN
INTRAVENOUS | Status: DC | PRN
  Administered 2024-04-01: 16 mg via INTRAVENOUS

## 2024-04-01 MED ORDER — LIDOCAINE 2% (20 MG/ML) 5 ML SYRINGE
INTRAMUSCULAR | Status: DC | PRN
  Administered 2024-04-01: 50 mg via INTRAVENOUS

## 2024-04-01 MED ORDER — SODIUM CHLORIDE 0.9 % IV SOLN: INTRAVENOUS | Status: DC

## 2024-04-01 MED ORDER — AMIODARONE HCL 200 MG PO TABS: ORAL_TABLET | ORAL | 0 refills | Status: DC

## 2024-04-01 MED ORDER — APIXABAN 5 MG PO TABS
ORAL_TABLET | ORAL | Status: AC
Start: 2024-04-01 — End: 2024-04-01
  Filled 2024-04-01: qty 1

## 2024-04-01 SURGICAL SUPPLY — 1 items: PAD DEFIB RADIO PHYSIO CONN (PAD) ×2 IMPLANT

## 2024-04-01 NOTE — TOC Transition Note (Signed)
 Transition of Care [facility]) - Discharge Note   Patient Details  Name: Wayne Gordon MRN: [id number] Date of Birth: Oct 08, 1933  [facility] CM/SW Contact:  Waddell Barnie Rama, RN Phone Number: 04/01/2024, 10:55 AM   Clinical Narrative:    For dc today, patient has a walker and a cane at home.  He states [name] , wife's nephew will transport him home.         Patient Goals and CMS Choice            Discharge Placement                       Discharge Plan and Services Additional resources added to the After Visit Summary for                                       Social Drivers of Health (SDOH) Interventions SDOH Screenings   Food Insecurity: No Food Insecurity (03/29/2024)  Housing: Low Risk  (03/29/2024)  Transportation Needs: No Transportation Needs (03/29/2024)  Utilities: Not At Risk (03/29/2024)  Financial Resource Strain: Medium Risk (09/05/2022)   Received from [facility]  Physical Activity: Unknown (09/05/2022)   Received from [facility]  Social Connections: Moderately Isolated (03/29/2024)  Stress: No Stress Concern Present (09/05/2022)   Received from [facility]  Tobacco Use: Low Risk  (04/01/2024)  Health Literacy: Low Risk  (04/06/2020)   Received from [facility]     Readmission Risk Interventions    04/01/2024   10:47 AM 10/15/2021    9:16 AM  Readmission Risk Prevention Plan  Medication Screening  Complete  Transportation Screening Complete Complete  PCP or Specialist Appt within 5-7 Days Complete   Home Care Screening Complete   Medication Review (RN CM) Complete

## 2024-04-01 NOTE — CV Procedure (Signed)
 Procedure:   DCCV  Indication:  Symptomatic atrial flutter  Procedure Note:  The patient signed informed consent.  They have had had therapeutic anticoagulation with apixaban  greater than 3 weeks.  Anesthesia was administered by Dr. Leonce.  Patient received 50 mg IV lidocaine  and 16 mg IV etomidate . Adequate airway was maintained throughout and vital followed per protocol.  They were cardioverted x 1 with 200J of biphasic synchronized energy.  They converted to NSR, verified at bedside with device rep and interrogation.  There were no apparent complications.  The patient had normal neuro status and respiratory status post procedure with vitals stable as recorded elsewhere.    Follow up:  They will continue on current medical therapy and follow up with cardiology as scheduled.  Shelda Bruckner, MD PhD 04/01/2024 8:17 AM

## 2024-04-01 NOTE — Anesthesia Postprocedure Evaluation (Signed)
 Anesthesia Post Note  Patient: Tobiah Celestine Harrell  Procedure(s) Performed: CARDIOVERSION     Patient location during evaluation: Cath Lab Anesthesia Type: General Level of consciousness: awake and alert, oriented and patient cooperative Pain management: pain level controlled Vital Signs Assessment: post-procedure vital signs reviewed and stable Respiratory status: spontaneous breathing, nonlabored ventilation and respiratory function stable Cardiovascular status: blood pressure returned to baseline and stable Postop Assessment: no apparent nausea or vomiting Anesthetic complications: no   No notable events documented.  Last Vitals:  Vitals:   04/01/24 0840 04/01/24 0857  BP: 113/64 106/64  Pulse: 76 74  Resp: 15 20  Temp:  (!) 36.4 C  SpO2: 94% 97%    Last Pain:  Vitals:   04/01/24 0857  TempSrc: Oral  PainSc:                  Theora Vankirk,E. Ashish Rossetti

## 2024-04-01 NOTE — Transfer of Care (Signed)
 Immediate Anesthesia Transfer of Care Note  Patient: Wayne Gordon  Procedure(s) Performed: CARDIOVERSION  Patient Location: Cath Lab  Anesthesia Type:General  Level of Consciousness: drowsy  Airway & Oxygen  Therapy: Patient Spontanous Breathing and Patient connected to nasal cannula oxygen   Post-op Assessment: Report given to RN and Post -op Vital signs reviewed and stable  Post vital signs: Reviewed and stable  Last Vitals:  Vitals Value Taken Time  BP    Temp    Pulse    Resp    SpO2      Last Pain:  Vitals:   04/01/24 0741  TempSrc: Temporal  PainSc:          Complications: No notable events documented.

## 2024-04-01 NOTE — Progress Notes (Signed)
 Heart Failure Navigator Progress Note  Assessed for Heart & Vascular TOC clinic readiness.  Patient does not meet criteria due to patient has declined a HF TOC appointment Stated  he wants to stay with Dr. Waddell in [place]. Patient discharging on 04/01/2024. .   Navigator will sign off at this time.   Stephane Haddock, BSN, Scientist, clinical (histocompatibility and immunogenetics) Only

## 2024-04-01 NOTE — Discharge Summary (Signed)
 Physician Discharge Summary   Patient: Wayne Gordon MRN: [id number] DOB: [birth year]  Admit date:     03/29/2024  Discharge date: 04/01/24  Discharge Physician: MDALA-GAUSI, GOLDEN PILLOW   PCP: Renato Dorothey CHRISTELLA, NP   Recommendations at discharge:   Follow-up with cardiology and with PCP.  Discharge Diagnoses: Principal Problem:   Atrial fibrillation (HCC) Active Problems:   S/P CABG x 3   Diabetes mellitus, type 2 (HCC)   CAD (coronary artery disease) of artery bypass graft   History of implantable cardioverter-defibrillator (ICD) placement   Benign essential HTN   Chronic kidney disease, stage 3b (HCC)   Acute CHF (congestive heart failure) (HCC)   Atrial flutter (HCC)  Resolved Problems:   * No resolved hospital problems. *  Hospital Course: 89 year old man with PMH of hypertension, stage III chronic kidney disease, diabetes, ischemic cardiomyopathy, atrial fibrillation on Eliquis , heart failure with reduced EF of 25%, ICD/pacer present who presented with weakness that has been progressive since recent adjustment to ICD.  Patient was unable to get out of bed on 8/1 and presented to hospital. Patient has been seen by cardiology/EP and ICD has been interrogated.  Patient found to have atrial flutter.  EP performed cardioversion on 8/4.  Assessment and Plan: Weakness Patient presented with weakness which has been progressive since recent ICD adjustment. Hemoglobin 14.0. Troponin 19 >19 Potassium 3.5 on presentation.  Magnesium  2.1.  TSH 3.79.  Creatinine at baseline. Cardiology was consulted.  Patient was seen by EP.   ICD interrogated.  Patient with atrial flutter, felt to be the etiology of his current symptoms. He was started on an oral amiodarone  load 400mg  BID x7d, then 200mg  BID x 7d, then 200mg  daily thereafter per cardiology recommendations. He was diuresed with IV Lasix . He underwent cardioversion on Monday, 04/01/2024.  Cleared for discharge home thereafter.    HFrEF s/p ICD with exacerbation TTE done 8/2 shows EF 20 to 25%, unchanged from prior (February 2023). Patient has been seen by cardiology/EP as indicated above. He was diuresed with IV Lasix . GDMT including Coreg , Farxiga  were continued.  Atrial fibrillation/flutter Per interrogation, patient in atrial flutter. He was started on amiodarone  as indicated above. Home Eliquis  was continued.   CAD s/p CABG Continued home Crestor .   T2DM On glargine insulin , sliding scale insulin  at home. He was kept on a diabetic diet.  Home medications were resumed at discharge.   Vertigo Patient says he has had vertigo on and off for several months. PT was consulted for vestibular evaluation. Vertigo was confirmed. Epley maneuver was done. Patient to follow-up for outpatient continued vestibular rehabilitation.       Consultants: Cardiology/EP Procedures performed: DCCV Disposition: Home Diet recommendation:  Discharge Diet Orders (From admission, onward)     Start     Ordered   04/01/24 0000  Diet - low sodium heart healthy        04/01/24 1024           Cardiac and Carb modified diet DISCHARGE MEDICATION: Allergies as of 04/01/2024       Reactions   Ace Inhibitors Swelling, Other (See Comments)   Kidney issues; angioedema per PMH in chart   Penicillins Palpitations        Medication List     TAKE these medications    acetaminophen  650 MG CR tablet Commonly known as: TYLENOL  Take 1,300 mg by mouth every 8 (eight) hours as needed for pain.   amiodarone  200 MG tablet Commonly known as:  PACERONE  Take 2 tablets (400 mg total) by mouth 2 (two) times daily for 5 days, THEN 1 tablet (200 mg total) 2 (two) times daily for 7 days, THEN 1 tablet (200 mg total) daily. Start taking on: April 01, 2024   carvedilol  12.5 MG tablet Commonly known as: COREG  Take 1 tablet (12.5 mg total) by mouth 2 (two) times daily with a meal.   dapagliflozin  propanediol 10 MG Tabs  tablet Commonly known as: FARXIGA  Take 1 tablet (10 mg total) by mouth daily.   diphenhydramine-acetaminophen  25-500 MG Tabs tablet Commonly known as: TYLENOL  PM Take 1 tablet by mouth at bedtime.   Eliquis  2.5 MG Tabs tablet Generic drug: apixaban  TAKE (1) TABLET TWICE A DAY. What changed: See the new instructions.   furosemide  40 MG tablet Commonly known as: LASIX  Take 40 mg by mouth daily.   gabapentin  100 MG capsule Commonly known as: NEURONTIN  Take 200-300 mg by mouth at bedtime. Takes an extra capsule if struggling to sleep.   HumaLOG  KwikPen 100 UNIT/ML KwikPen Generic drug: insulin  lispro Inject 8-10 Units into the skin 2 (two) times daily.   omeprazole  40 MG capsule Commonly known as: PRILOSEC Take 1 capsule (40 mg total) by mouth daily before breakfast.   rosuvastatin  5 MG tablet Commonly known as: CRESTOR  Take 5 mg by mouth daily.   sodium chloride  0.65 % Soln nasal spray Commonly known as: OCEAN Place 1 spray into both nostrils as needed for congestion.   Toujeo  SoloStar 300 UNIT/ML Solostar Pen Generic drug: insulin  glargine (1 Unit Dial ) Inject 26 Units into the skin daily.        Follow-up Information     Renato Dorothey HERO, NP Follow up.   Specialty: Internal Medicine Why: Please follow up in a week. Contact information: [address] [place] [address] [phone]         [facility] Follow up.   Specialty: Rehabilitation Why: They will call you to make apts, if you have not heard from them in 3 business days please call them Contact information: [address] [place]  [address] [phone]               Discharge Exam: Filed Weights   03/29/24 2136 03/30/24 0357 03/31/24 0555  Weight: 110.2 kg 110.2 kg 106.3 kg   Physical Exam   General: Alert, oriented X3  Eyes: Pupils equal, reactive  Oral cavity: moist mucous membranes  Head: Atraumatic, normocephalic   Neck: supple  Chest: clear to auscultation. No crackles, no wheezes  CVS: S1,S2 RRR. No murmurs  Abd: No distention, soft, non-tender. No masses palpable  Extr: No edema   MSK: No joint deformities or swelling  Neurological: Grossly intact.    Condition at discharge: stable  The results of significant diagnostics from this hospitalization (including imaging, microbiology, ancillary and laboratory) are listed below for reference.   Imaging Studies: EP STUDY Result Date: 04/01/2024 See surgical note for result.  ECHOCARDIOGRAM COMPLETE Result Date: 03/30/2024    ECHOCARDIOGRAM REPORT   Patient Name:   Wayne Gordon Date of Exam: 03/30/2024 Medical Rec #:  [id number]      Height:       76.0 in Accession #:    [id number]     Weight:       242.9 lb Date of Birth:  [birth year]       BSA:          2.406 m Patient  Age:    89 years       BP:           106/66 mmHg Patient Gender: M              HR:           80 bpm. Exam Location:  Inpatient Procedure: 2D Echo, Cardiac Doppler, Color Doppler and Intracardiac            Opacification Agent (Both Spectral and Color Flow Doppler were            utilized during procedure). Indications:    I50.21 Acute systolic (congestive) heart failure  History:        Patient has prior history of Echocardiogram examinations, most                 recent 10/15/2021. CHF, CAD, Prior CABG and Defibrillator,                 Arrythmias:Atrial Fibrillation; Risk Factors:Hypertension and                 Diabetes.  Sonographer:    Damien Senior RDCS Referring Phys: [phone] JACOB J STINSON IMPRESSIONS  1. No LV thrombus by Definity . Left ventricular ejection fraction, by estimation, is 20 to 25%. The left ventricle has severely decreased function. The left ventricle demonstrates global hypokinesis. The left ventricular internal cavity size was mildly dilated. There is mild concentric left ventricular hypertrophy. Left ventricular diastolic parameters are indeterminate. There is the  interventricular septum is flattened in systole and diastole, consistent with right ventricular pressure and volume overload.  2. Right ventricular systolic function is mildly reduced. The right ventricular size is moderately enlarged. There is normal pulmonary artery systolic pressure. The estimated right ventricular systolic pressure is 32.8 mmHg.  3. Left atrial size was mildly dilated.  4. S/p Sorin Memo 3D ring annuloplasty (size 30mm, Catalog #[id number], serial [id number]). The mitral valve has been repaired/replaced. Mild mitral valve regurgitation. No evidence of mitral stenosis. The mean mitral valve gradient is 3.0 mmHg with average heart rate of 85 bpm. Procedure Date: 10/17/11. Echo findings are consistent with normal structure and function of the mitral valve prosthesis.  5. The tricuspid valve is abnormal. Tricuspid valve regurgitation is moderate.  6. The aortic valve is tricuspid. There is moderate calcification of the aortic valve. There is moderate thickening of the aortic valve. Aortic valve regurgitation is trivial. Aortic valve sclerosis/calcification is present, without any evidence of aortic stenosis.  7. The inferior vena cava is normal in size with <50% respiratory variability, suggesting right atrial pressure of 8 mmHg. FINDINGS  Left Ventricle: No LV thrombus by Definity . Left ventricular ejection fraction, by estimation, is 20 to 25%. The left ventricle has severely decreased function. The left ventricle demonstrates global hypokinesis. Definity  contrast agent was given IV to delineate the left ventricular endocardial borders. Strain was performed and the global longitudinal strain is indeterminate. The left ventricular internal cavity size was mildly dilated. There is mild concentric left ventricular hypertrophy. The interventricular septum is flattened in systole and diastole, consistent with right ventricular pressure and volume overload. Left ventricular diastolic function could not be  evaluated due to mitral valve repair. Left ventricular diastolic parameters are  indeterminate. Right Ventricle: The right ventricular size is moderately enlarged. No increase in right ventricular wall thickness. Right ventricular systolic function is mildly reduced. There is normal pulmonary artery systolic pressure. The tricuspid regurgitant velocity is 2.49 m/s, and with an assumed  right atrial pressure of 8 mmHg, the estimated right ventricular systolic pressure is 32.8 mmHg. Left Atrium: Left atrial size was mildly dilated. Right Atrium: Right atrial size was normal in size. Pericardium: There is no evidence of pericardial effusion. Mitral Valve: S/p Sorin Memo 3D ring annuloplasty (size 30mm, Catalog #[id number], serial [id number]). The mitral valve has been repaired/replaced. Mild mitral valve regurgitation. There is a 30 mm Mitral Memo ring prosthetic annuloplasty ring present in the mitral position. Echo findings are consistent with normal structure and function of the mitral valve prosthesis. No evidence of mitral valve stenosis. MV peak gradient, 6.6 mmHg. The mean mitral valve gradient is 3.0 mmHg with average heart rate of 85 bpm. Tricuspid Valve: The tricuspid valve is abnormal. Tricuspid valve regurgitation is moderate. Aortic Valve: The aortic valve is tricuspid. There is moderate calcification of the aortic valve. There is moderate thickening of the aortic valve. Aortic valve regurgitation is trivial. Aortic valve sclerosis/calcification is present, without any evidence of aortic stenosis. Pulmonic Valve: The pulmonic valve was not well visualized. Pulmonic valve regurgitation is trivial. Aorta: The aortic root and ascending aorta are structurally normal, with no evidence of dilitation. Venous: The inferior vena cava is normal in size with less than 50% respiratory variability, suggesting right atrial pressure of 8 mmHg. IAS/Shunts: No atrial level shunt detected by color flow Doppler. Additional Comments:  3D was performed not requiring image post processing on an independent workstation and was indeterminate. A device lead is visualized in the right atrium and right ventricle.  LEFT VENTRICLE PLAX 2D LVIDd:         5.10 cm LVIDs:         4.80 cm LV PW:         1.20 cm LV IVS:        1.20 cm LVOT diam:     2.00 cm LV SV:         33 LV SV Index:   14 LVOT Area:     3.14 cm  LEFT ATRIUM              Index        RIGHT ATRIUM           Index LA diam:        4.60 cm  1.91 cm/m   RA Area:     18.60 cm LA Vol (A2C):   107.0 ml 44.47 ml/m  RA Volume:   51.50 ml  21.40 ml/m LA Vol (A4C):   79.8 ml  33.17 ml/m LA Biplane Vol: 93.3 ml  38.78 ml/m  AORTIC VALVE LVOT Vmax:   54.30 cm/s LVOT Vmean:  37.400 cm/s LVOT VTI:    0.104 m  AORTA Ao Root diam: 3.30 cm Ao Asc diam:  3.70 cm MITRAL VALVE             TRICUSPID VALVE MV Area VTI:  1.34 cm   TR Peak grad:   24.8 mmHg MV Peak grad: 6.6 mmHg   TR Vmax:        249.00 cm/s MV Mean grad: 3.0 mmHg MV Vmax:      1.29 m/s   SHUNTS MV Vmean:     80.7 cm/s  Systemic VTI:  0.10 m                          Systemic Diam: 2.00 cm Vishnu Priya Mallipeddi Electronically signed by Diannah Late Mallipeddi Signature Date/Time: 03/30/2024/11:31:43 AM  Final    DG Chest Portable 1 View Result Date: 03/29/2024 CLINICAL DATA:  Weakness.  Dizziness. EXAM: PORTABLE CHEST 1 VIEW COMPARISON:  10/15/2021. FINDINGS: Low lung volume. Mild diffuse pulmonary vascular congestion, likely accentuated by low lung volume. No frank pulmonary edema. There are probable atelectatic changes at the lung bases. There is subtle blunting of bilateral lateral costophrenic angles, which may be due to trace pleural effusions versus pleural thickening. No pneumothorax on either side. Stable cardio-mediastinal silhouette. There are surgical staples along the heart border and sternotomy wires, status post CABG (coronary artery bypass graft). There is a left sided 3-lead pacemaker. Neurostimulator device noted  overlying the lower thoracic spine region. No acute osseous abnormalities. The soft tissues are within normal limits. IMPRESSION: Mild diffuse pulmonary vascular congestion, likely accentuated by low lung volume. No frank pulmonary edema. Electronically Signed   By: Ree Molt M.D.   On: 03/29/2024 12:09   CUP PACEART INCLINIC DEVICE CHECK Result Date: 03/06/2024 Normal in-clinic CRT-D (multi-lead) check. Presenting Rhythm: AP/VP. Routine testing was performed. Thresholds, sensing, impedance trend were stable and no changes were required. HF diagnostics are stable. No treated arrhythmias. Patient BiV pacing 97% of the time. Estimated longevity 80%. Pt enrolled in remote follow-up.Anette Shutter, BSN, RN   Microbiology: Results for orders placed or performed during the hospital encounter of 10/14/21  Resp Panel by RT-PCR (Flu A&B, Covid) Nasopharyngeal Swab     Status: None   Collection Time: 10/14/21 10:50 PM   Specimen: Nasopharyngeal Swab; Nasopharyngeal(NP) swabs in vial transport medium  Result Value Ref Range Status   SARS Coronavirus 2 by RT PCR NEGATIVE NEGATIVE Final    Comment: (NOTE) SARS-CoV-2 target nucleic acids are NOT DETECTED.  The SARS-CoV-2 RNA is generally detectable in upper respiratory specimens during the acute phase of infection. The lowest concentration of SARS-CoV-2 viral copies this assay can detect is 138 copies/mL. A negative result does not preclude SARS-Cov-2 infection and should not be used as the sole basis for treatment or other patient management decisions. A negative result may occur with  improper specimen collection/handling, submission of specimen other than nasopharyngeal swab, presence of viral mutation(s) within the areas targeted by this assay, and inadequate number of viral copies(<138 copies/mL). A negative result must be combined with clinical observations, patient history, and epidemiological information. The expected result is  Negative.  Fact Sheet for Patients:  BloggerCourse.com  Fact Sheet for Healthcare Providers:  SeriousBroker.it  This test is no t yet approved or cleared by the [place]  FDA and  has been authorized for detection and/or diagnosis of SARS-CoV-2 by FDA under an Emergency Use Authorization (EUA). This EUA will remain  in effect (meaning this test can be used) for the duration of the COVID-19 declaration under Section 564(b)(1) of the Act, 21 U.S.C.section 360bbb-3(b)(1), unless the authorization is terminated  or revoked sooner.       Influenza A by PCR NEGATIVE NEGATIVE Final   Influenza B by PCR NEGATIVE NEGATIVE Final    Comment: (NOTE) The Xpert Xpress SARS-CoV-2/FLU/RSV plus assay is intended as an aid in the diagnosis of influenza from Nasopharyngeal swab specimens and should not be used as a sole basis for treatment. Nasal washings and aspirates are unacceptable for Xpert Xpress SARS-CoV-2/FLU/RSV testing.  Fact Sheet for Patients: BloggerCourse.com  Fact Sheet for Healthcare Providers: SeriousBroker.it  This test is not yet approved or cleared by the [place]  FDA and has been authorized for detection and/or diagnosis of SARS-CoV-2 by FDA under  an Emergency Use Authorization (EUA). This EUA will remain in effect (meaning this test can be used) for the duration of the COVID-19 declaration under Section 564(b)(1) of the Act, 21 U.S.C. section 360bbb-3(b)(1), unless the authorization is terminated or revoked.  Performed at [facility], [address] [place], [place] [address]   Respiratory (~20 pathogens) panel by PCR     Status: Abnormal   Collection Time: 10/15/21  3:23 PM   Specimen: Nasopharyngeal Swab; Respiratory  Result Value Ref Range Status   Adenovirus NOT DETECTED NOT DETECTED Final   Coronavirus 229E NOT DETECTED NOT DETECTED Final    Comment:  (NOTE) The Coronavirus on the Respiratory Panel, DOES NOT test for the novel  Coronavirus (2019 nCoV)    Coronavirus HKU1 NOT DETECTED NOT DETECTED Final   Coronavirus NL63 NOT DETECTED NOT DETECTED Final   Coronavirus OC43 NOT DETECTED NOT DETECTED Final   Metapneumovirus DETECTED (A) NOT DETECTED Final   Rhinovirus / Enterovirus NOT DETECTED NOT DETECTED Final   Influenza A NOT DETECTED NOT DETECTED Final   Influenza B NOT DETECTED NOT DETECTED Final   Parainfluenza Virus 1 NOT DETECTED NOT DETECTED Final   Parainfluenza Virus 2 NOT DETECTED NOT DETECTED Final   Parainfluenza Virus 3 NOT DETECTED NOT DETECTED Final   Parainfluenza Virus 4 NOT DETECTED NOT DETECTED Final   Respiratory Syncytial Virus NOT DETECTED NOT DETECTED Final   Bordetella pertussis NOT DETECTED NOT DETECTED Final   Bordetella Parapertussis NOT DETECTED NOT DETECTED Final   Chlamydophila pneumoniae NOT DETECTED NOT DETECTED Final   Mycoplasma pneumoniae NOT DETECTED NOT DETECTED Final    Comment: Performed at [facility], [address] [place], [place] [address]    Labs: CBC: Recent Labs  Lab 03/29/24 1120  WBC 6.8  NEUTROABS 4.1  HGB 14.0  HCT 44.0  MCV 95.7  PLT 203   Basic Metabolic Panel: Recent Labs  Lab 03/29/24 1120 03/30/24 1114 03/31/24 0225 04/01/24 0322  NA 142 141 139 137  K 3.5 3.7 3.8 5.0  CL 103 98 97* 96*  CO2 24 31 31 31   GLUCOSE 101* 129* 68* 99  BUN 27* 22 23 25*  CREATININE 2.08* 1.92* 2.03* 2.14*  CALCIUM  8.7* 9.1 9.1 9.0  MG  --  2.1 2.0 2.2  PHOS  --  3.5  --   --    Liver Function Tests: Recent Labs  Lab 03/29/24 1120  AST 25  ALT 20  ALKPHOS 90  BILITOT 1.2  PROT 6.7  ALBUMIN  3.7   CBG: Recent Labs  Lab 03/31/24 1237 03/31/24 1604 03/31/24 2121 04/01/24 0806 04/01/24 1112  GLUCAP 206* 259* 130* 124* 160*    Discharge time spent: greater than 30 minutes.  Signed: MDALA-GAUSI, Jerrico Covello AGATHA, MD [facility] 04/01/2024

## 2024-04-01 NOTE — Anesthesia Preprocedure Evaluation (Addendum)
 Anesthesia Evaluation  Patient identified by MRN, date of birth, ID band Patient awake    Reviewed: Allergy & Precautions, NPO status , Patient's Chart, lab work & pertinent test results, reviewed documented beta blocker date and time   History of Anesthesia Complications Negative for: history of anesthetic complications  Airway Mallampati: I  TM Distance: >3 FB Neck ROM: Full    Dental  (+) Edentulous Upper, Edentulous Lower   Pulmonary neg pulmonary ROS   breath sounds clear to auscultation       Cardiovascular hypertension, Pt. on medications and Pt. on home beta blockers (-) angina + CAD, + Past MI, + CABG and +CHF  + dysrhythmias Atrial Fibrillation + pacemaker (BiV pacing) + Cardiac Defibrillator + Valvular Problems/Murmurs (s/p MVR)  Rhythm:Regular Rate:Normal  03/30/2024 ECHO: EF 20 to 25%.  1. The LV has severely decreased function, global hypokinesis. The left ventricular internal cavity size was mildly dilated. There is mild concentric LVH. There is the  interventricular septum is flattened in systole and diastole, consistent with right ventricular pressure and volume overload.   2. RVF is mildly reduced. The right ventricular size is moderately enlarged. There is normal pulmonary artery systolic pressure. The estimated right ventricular systolic pressure is 32.8 mmHg.   3. Left atrial size was mildly dilated.   4. S/p Sorin Memo 3D ring annuloplasty (size 30mm, Catalog #[id number], serial  [id number]). The mitral valve has been repaired/replaced. Mild mitral valve regurgitation. No evidence of mitral stenosis. The mean mitral valve  gradient is 3.0 mmHg with average heart rate of 85 bpm. Procedure Date: 10/17/11. Echo findings are consistent with normal structure and function of the mitral valve  prosthesis.   5. The tricuspid valve is abnormal. Tricuspid valve regurgitation is moderate.   6. The aortic valve is tricuspid. There is  moderate calcification of the aortic valve. There is moderate thickening of the aortic valve. Aortic valve regurgitation is trivial. Aortic valve sclerosis/calcification is present, without any evidence of aortic stenosis.     Neuro/Psych Pain: spinal cord stim    GI/Hepatic Neg liver ROS,GERD  Medicated and Controlled,,  Endo/Other  diabetes (glu 99), Insulin  Dependent    Renal/GU Renal InsufficiencyRenal disease     Musculoskeletal  (+) Arthritis ,    Abdominal   Peds  Hematology eliquis    Anesthesia Other Findings   Reproductive/Obstetrics                              Anesthesia Physical Anesthesia Plan  ASA: 4  Anesthesia Plan: General   Post-op Pain Management: Minimal or no pain anticipated   Induction: Intravenous  PONV Risk Score and Plan: 2 and Treatment may vary due to age or medical condition  Airway Management Planned: Nasal Cannula and Natural Airway  Additional Equipment: None  Intra-op Plan:   Post-operative Plan:   Informed Consent: I have reviewed the patients History and Physical, chart, labs and discussed the procedure including the risks, benefits and alternatives for the proposed anesthesia with the patient or authorized representative who has indicated his/her understanding and acceptance.       Plan Discussed with: CRNA and Surgeon  Anesthesia Plan Comments:          Anesthesia Quick Evaluation

## 2024-04-01 NOTE — Interval H&P Note (Signed)
 History and Physical Interval Note:  04/01/2024 8:00 AM  Wayne Gordon  has presented today for surgery, with the diagnosis of afib.  The various methods of treatment have been discussed with the patient and family. After consideration of risks, benefits and other options for treatment, the patient has consented to  Procedure(s): CARDIOVERSION (N/A) as a surgical intervention.  The patient's history has been reviewed, patient examined, no change in status, stable for surgery.  I have reviewed the patient's chart and labs.  Questions were answered to the patient's satisfaction.     [name] Lonni

## 2024-04-01 NOTE — TOC CM/SW Note (Signed)
 Transition of Care [facility]) - Inpatient Brief Assessment   Patient Details  Name: Wayne Gordon MRN: [id number] Date of Birth: 08/26/1934  Transition of Care [facility]) CM/SW Contact:    Waddell Barnie Rama, RN Phone Number: 04/01/2024, 10:48 AM   Clinical Narrative: From home with spouse and son, has PCP and insurance on file, states has no HH services in place at this time, has walker at home.  States family member will transport them home at [employer] and family is support system, states gets medications from [employer].  Pta self ambulatory with walker.   Per PT eval rec outpatient PT, Patient states he prefers to go to the [place] outpatient rehab.  NCM made referral thru epic.   Transition of Care Asessment: Insurance and Status: Insurance coverage has been reviewed Patient has primary care physician: Yes Home environment has been reviewed: from home with wife and son Prior level of function:: ambulatory with walker Prior/Current Home Services: No current home services Social Drivers of Health Review: SDOH reviewed no interventions necessary Readmission risk has been reviewed: Yes Transition of care needs: transition of care needs identified, TOC will continue to follow

## 2024-04-01 NOTE — Progress Notes (Signed)
 Reviewed AVS, patient expressed understanding of medications, MD follow up reviewed.   Removed IV, Site clean, dry and intact.  Patient states all belongings brought to the hospital at time of admission are accounted for and packed to take home.  Patient informed and expressed understanding of where to pick up discharge medications. Pt transported to Discharge lounge to wait for transportation home.

## 2024-04-01 NOTE — Progress Notes (Addendum)
 Physical Therapy Treatment Patient Details Name: Wayne Gordon MRN: [id number] DOB: 05-07-1934 Today's Date: 04/01/2024   History of Present Illness 89 y.o. male presented 03/29/24 with weakness and in a-fib. Recent vertigo; Acute CHF EF 20-25%; aflutter with cardioversion 8/4; PMH significant of hypertension, stage III chronic kidney disease, diabetes, ischemic cardiomyopathy, atrial fibrillation on Eliquis , heart failure with reduced EF of 25%, ICD/pacer    PT Comments  Pt admitted with above diagnosis. Pt was able to ambulate with RW in hallway without LOB with min challenges to balance. Still slight dizziness with head turns therefore recommend Outpt PT f/u. Pt agrees. Pt did have negative Hallpike/Epley maneuver today.  Will continues to follow acutely.  Pt currently with functional limitations due to the deficits listed below (see PT Problem List). Pt will benefit from acute skilled PT to increase their independence and safety with mobility to allow discharge.       If plan is discharge home, recommend the following: A little help with walking and/or transfers;A little help with bathing/dressing/bathroom;Assistance with cooking/housework;Assist for transportation;Help with stairs or ramp for entrance   Can travel by private vehicle        Equipment Recommendations  Rolling walker (2 wheels)    Recommendations for Other Services       Precautions / Restrictions Precautions Precautions: Fall Recall of Precautions/Restrictions: Intact Restrictions Weight Bearing Restrictions Per Provider Order: No     Mobility  Bed Mobility Overal bed mobility: Modified Independent             General bed mobility comments: Performed left Epley prior to getting pt OOB.  No dizziness or nystagmus noted.  Pt needed incr time/effort for bed mobility.    Transfers Overall transfer level: Needs assistance Equipment used: Rolling walker (2 wheels) Transfers: Sit to/from Stand, Bed to  chair/wheelchair/BSC Sit to Stand: Contact guard assist           General transfer comment: No assist and no cues for hand placement    Ambulation/Gait Ambulation/Gait assistance: Contact guard assist Gait Distance (Feet): 125 Feet Assistive device: Rolling walker (2 wheels) Gait Pattern/deviations: Step-through pattern, Decreased stride length, Trunk flexed   Gait velocity interpretation: <1.8 ft/sec, indicate of risk for recurrent falls   General Gait Details: Pt able to ambulate with RW and progress to hallway ambulation with RW.  Pt without LOB and was steady with RW.  Did have pt perform head turns and pt was slightly dizzy with quick head turn to his right but did not lose balance.  Otherwise no dizziness entire treatment. Pt flexes his neck slightly anterior and needs cues to look up. Also needed cues to stay close to [employer] Mobility     Tilt Bed    Modified Rankin (Stroke Patients Only)       Balance Overall balance assessment: Needs assistance Sitting-balance support: No upper extremity supported, Feet supported Sitting balance-Leahy Scale: Good     Standing balance support: Bilateral upper extremity supported Standing balance-Leahy Scale: Poor Standing balance comment: relies on RW but is steady in standing                            Communication Communication Communication: No apparent difficulties  Cognition Arousal: Alert Behavior During Therapy: WFL for tasks assessed/performed   PT - Cognitive impairments: No apparent impairments  Following commands: Intact      Cueing Cueing Techniques: Verbal cues  Exercises      General Comments General comments (skin integrity, edema, etc.): Discussed need for Outpt PT f/u with pt.  Pt agrees.  Also did perform a few x 1 exercises with pt but didnt have any vertigo therefore do not feel that he needs to initiate at this time.  BP on arrival was 79/65.  Took another BP and it was 111/65.  Sitting 124/67.  End of treatment 122/71.  HR 75-104 bpm with activity.      Pertinent Vitals/Pain Pain Assessment Pain Assessment: Faces Faces Pain Scale: Hurts little more Pain Location: neck with endrange rotation and extension Pain Descriptors / Indicators: Aching, Guarding Pain Intervention(s): Limited activity within patient's tolerance, Monitored during session, Repositioned    Home Living                          Prior Function            PT Goals (current goals can now be found in the care plan section) Progress towards PT goals: Progressing toward goals    Frequency    Min 2X/week      PT Plan      Co-evaluation              AM-PAC PT 6 Clicks Mobility   Outcome Measure  Help needed turning from your back to your side while in a flat bed without using bedrails?: None Help needed moving from lying on your back to sitting on the side of a flat bed without using bedrails?: None Help needed moving to and from a bed to a chair (including a wheelchair)?: A Little Help needed standing up from a chair using your arms (e.g., wheelchair or bedside chair)?: A Little Help needed to walk in hospital room?: A Little Help needed climbing 3-5 steps with a railing? : A Little 6 Click Score: 20    End of Session Equipment Utilized During Treatment: Gait belt Activity Tolerance: Patient tolerated treatment well Patient left: in chair;with call bell/phone within reach Nurse Communication: Mobility status PT Visit Diagnosis: Unsteadiness on feet (R26.81);BPPV;Dizziness and giddiness (R42) BPPV - Right/Left : Left     Time: 9049-8984 PT Time Calculation (min) (ACUTE ONLY): 25 min  Charges:    $Gait Training: 8-22 mins $Canalith Rep Proc: 8-22 mins PT General Charges $$ ACUTE PT VISIT: 1 Visit                     Wayne Gordon,PT Acute Rehab Services [phone]    Wayne Gordon 04/01/2024, 10:37 AM

## 2024-04-01 NOTE — Progress Notes (Addendum)
 Patient Name: Wayne Gordon Date of Encounter: 04/01/2024  Primary Cardiologist: Danelle Birmingham, MD Electrophysiologist: None  Interval Summary   NAEO.  Questions answered about [facility] today.   Vital Signs    Vitals:   04/01/24 0419 04/01/24 0420 04/01/24 0421 04/01/24 0741  BP:   125/78 129/82  Pulse:   81 80  Resp: 16 18 20 18   Temp:   97.8 F (36.6 C) 98.3 F (36.8 C)  TempSrc:   Oral Temporal  SpO2:   98% 96%  Weight:      Height:        Intake/Output Summary (Last 24 hours) at 04/01/2024 0754 Last data filed at 04/01/2024 0418 Gross per 24 hour  Intake 560 ml  Output 1300 ml  Net -740 ml   Filed Weights   03/29/24 2136 03/30/24 0357 03/31/24 0555  Weight: 110.2 kg 110.2 kg 106.3 kg    Physical Exam    GEN- NAD, Alert and oriented  Lungs- Clear to ausculation bilaterally, normal work of breathing Cardiac- Irregularly irregular rate and rhythm, no murmurs, rubs or gallops GI- soft, NT, ND, + BS Extremities- no clubbing or cyanosis. No edema  Telemetry    Vpacing 80s primarily, in AFL underlying (personally reviewed)  Hospital Course    Wayne Gordon is a 89 y.o. male with a hx of h/o CAD s/p CABG with MVR 2013, ICM with baseline EF 20-25%, VT, sinus node dysfunction s/p ICD 2013 with upgrade of Biotronik BiV ICD 09/2020, permanent atrial fibrillation, HTN, HLD, DM II and stage III CKD, spinal cord stimulator who is being seen 03/29/2024 for the evaluation of generalized weakness, weight gain, ankle edema- CHF symptoms   Assessment & Plan    Persistent AF/AFL For [facility] this am Continue amiodarone  taper 400 mg BID x 7 days, then 200 mg BID x 7 days, then 200 mg daily.   Potentially could go home this afternoon from an EP perspective. EP APP follow up made.   Acute on chronic systolic CHF S/p BIV ICD On home diuretics  CAD s/p CABG No s/s of ischemia.      For questions or updates, please contact [facility] Please consult [email or web address] for contact  info under     Signed, Ozell Prentice Passey, PA-C  04/01/2024, 7:54 AM     I have seen, examined the patient, and reviewed the above assessment and plan.    Interval:  Underwent successful cardioversion today. Feeling well. No new or acute complaints.   General: Well developed, in no acute distress.  Neck: No JVD.  Cardiac: Normal rate, regular rhythm.  Resp: Normal work of breathing.  Ext: No edema.  Neuro: No gross focal deficits.  Psych: Normal affect.   Assessment and Plan:  Wayne Gordon is a 89 y.o. male with a hx of h/o CAD s/p CABG with MVR 2013, ICM with baseline EF 20-25%, VT, sinus node dysfunction s/p ICD 2013 with upgrade of Biotronik BiV ICD 09/2020, persistent atrial fibrillation, HTN, HLD, DM II and stage III CKD, spinal cord stimulator who presented with fatigue and weight gain, LE edema, consistent with ADHF. Appears to have gone into atrial flutter since last clinic visit on 7/9 based on device interrogation. I suspect this is the etiology of his current symptoms and decompensated heart failure. Given his degree of LV dysfunction, I think that maintenance of sinus rhythm is important moving forward.  S/p successful DCCV on 8/4.   #. Persistent atrial fib/flutter #. S/p  BiV ICD #. Acute on chronic systolic heart failure 2/2 ICM #. CAD s/p CABG #. HTN #. HLD #. DMII #. CKD stage III   Plan:  - Continue oral amiodarone  load - 400mg  BID x7d, then 200mg  BID x 7d, then 200mg  daily thereafter. - Continue Eliquis  2.5mg  BID. Dose reduced d/t age and creatinine.  - Transition to home diuretics.  - Continue home GDMT regimen.  - Continue to monitor AF/AFL burden with device.   Fonda Kitty, MD 04/01/2024 10:01 PM

## 2024-04-02 NOTE — Telephone Encounter (Signed)
 Seen in ED 03/29/2024 and released on 04/01/2024. Reports having a cardioversion while at the hospital. Reports his swelling and symptoms have improved.

## 2024-04-04 ENCOUNTER — Telehealth: Payer: Self-pay | Admitting: Internal Medicine

## 2024-04-04 NOTE — Telephone Encounter (Signed)
 Started taking on Saturday, tomorrow he decreases to 1 tab BID. He feels off balance, knees weak when I stand. Says he has no strength.   Aware I will send to MD for review/advisement.

## 2024-04-04 NOTE — Telephone Encounter (Signed)
 Pt c/o medication issue:  1. Name of Medication:   amiodarone  (PACERONE ) 200 MG tablet   2. How are you currently taking this medication (dosage and times per day)?   2 tablets twice daily at this time  3. Are you having a reaction (difficulty breathing--STAT)?   4. What is your medication issue?   Patient stated he started on this medication while he was in the hospital and it is making the muscles in his legs and arms lose strength.

## 2024-04-05 ENCOUNTER — Other Ambulatory Visit: Payer: Self-pay

## 2024-04-05 NOTE — Telephone Encounter (Signed)
 Stop amiodarone

## 2024-04-05 NOTE — Telephone Encounter (Signed)
 Spoke with Pt. Advised to stop Amiodarone . Pt stated understanding and will call back at the end of next week if not improving.

## 2024-04-08 ENCOUNTER — Telehealth: Payer: Self-pay | Admitting: Internal Medicine

## 2024-04-08 NOTE — Telephone Encounter (Signed)
 I will forward to Dr.Taylor for advice.

## 2024-04-08 NOTE — Telephone Encounter (Signed)
 Pt c/o swelling/edema: STAT if pt has developed SOB within 24 hours  If swelling, where is the swelling located? Legs, more so the right leg and ankle currently. He states he sleeps on his right side and wasn't sure if this   How much weight have you gained and in what time span? 6 lbs in a week   Have you gained 2 pounds in a day or 5 pounds in a week? 6 lbs in a week   Do you have a log of your daily weights (if so, list)?   243lbs - while hospitalized  234lbs - when he came home  32lbs - currently   Are you currently taking a fluid pill? Yes, states he doubled up on lasix  to 80mg    Are you currently SOB? No   Have you traveled recently in a car or plane for an extended period of time? No

## 2024-04-09 NOTE — Telephone Encounter (Signed)
 Patient calling back for an update. Please advise

## 2024-04-10 NOTE — Telephone Encounter (Signed)
 Spoke with pt and he states that his current weight is 242 lbs. Pt states he took 80 mg of Lasix  on yesterday. He had some vertigo and fatigue this morning and while in the hospital. Pt states that he is not voiding much. He does report that his urine is pale yellow. Please advise.

## 2024-04-11 ENCOUNTER — Ambulatory Visit: Attending: Nurse Practitioner | Admitting: Nurse Practitioner

## 2024-04-11 ENCOUNTER — Ambulatory Visit

## 2024-04-11 ENCOUNTER — Encounter: Payer: Self-pay | Admitting: Nurse Practitioner

## 2024-04-11 ENCOUNTER — Telehealth: Payer: Self-pay | Admitting: Nurse Practitioner

## 2024-04-11 VITALS — BP 122/76 | HR 86 | Ht 78.0 in | Wt 248.8 lb

## 2024-04-11 DIAGNOSIS — I5043 Acute on chronic combined systolic (congestive) and diastolic (congestive) heart failure: Secondary | ICD-10-CM

## 2024-04-11 DIAGNOSIS — Z95810 Presence of automatic (implantable) cardiac defibrillator: Secondary | ICD-10-CM

## 2024-04-11 DIAGNOSIS — I5021 Acute systolic (congestive) heart failure: Secondary | ICD-10-CM

## 2024-04-11 DIAGNOSIS — I480 Paroxysmal atrial fibrillation: Secondary | ICD-10-CM

## 2024-04-11 DIAGNOSIS — N183 Chronic kidney disease, stage 3 unspecified: Secondary | ICD-10-CM

## 2024-04-11 DIAGNOSIS — I2510 Atherosclerotic heart disease of native coronary artery without angina pectoris: Secondary | ICD-10-CM

## 2024-04-11 DIAGNOSIS — Z9889 Other specified postprocedural states: Secondary | ICD-10-CM | POA: Diagnosis not present

## 2024-04-11 DIAGNOSIS — I38 Endocarditis, valve unspecified: Secondary | ICD-10-CM

## 2024-04-11 DIAGNOSIS — I5020 Unspecified systolic (congestive) heart failure: Secondary | ICD-10-CM | POA: Diagnosis not present

## 2024-04-11 DIAGNOSIS — E785 Hyperlipidemia, unspecified: Secondary | ICD-10-CM

## 2024-04-11 DIAGNOSIS — R0689 Other abnormalities of breathing: Secondary | ICD-10-CM

## 2024-04-11 DIAGNOSIS — I4891 Unspecified atrial fibrillation: Secondary | ICD-10-CM

## 2024-04-11 DIAGNOSIS — N184 Chronic kidney disease, stage 4 (severe): Secondary | ICD-10-CM

## 2024-04-11 DIAGNOSIS — I10 Essential (primary) hypertension: Secondary | ICD-10-CM

## 2024-04-11 NOTE — Patient Instructions (Addendum)
 Medication Instructions:  Your physician recommends that you continue on your current medications as directed. Please refer to the Current Medication list given to you today.  Labwork: Today at [facility]   Testing/Procedures: chest X-ray  Follow-Up: Your physician recommends that you schedule a follow-up appointment in: 4-6 weeks   Any Other Special Instructions Will Be Listed Below (If Applicable).  If you need a refill on your cardiac medications before your next appointment, please call your pharmacy.  HEART FAILURE INSTRUCTION SHEET  Follow a low-salt diet-you are allowed no more than 2,000 mg of sodium per day. Watch your fluid intake. In general, you should not be taking more than 64 ounces a day (no more than 8 glasses per day). Sometimes we refer to this as 2 liters per day. This includes sources of water  in food like soup, coffee, tea, milk etc. Weigh yourself on the same scale at the same time of the day preferably immediately after your first void. Keep a log of your weights. Call your doctor: (Anytime you feel any of the following symptoms)  3 lbs weight gain overnight or 5 lbs within a week Shortness of breath, with or without a day hacking cough Swelling in hands, feet or stomach If you have to sleep on extra pillows at night in order to breathe   IT IS IMPORTANT TO LET YOUR DOCTOR KNOW EARLY ON IF YOU ARE HAVING SYMPTOMS SO WE CAN HELP YOU!

## 2024-04-11 NOTE — Progress Notes (Unsigned)
 Cardiology Office Note   Date:  04/11/2024 ID:  Wayne Gordon, DOB 1934/07/04, MRN [id number] PCP: Renato Dorothey HERO, NP  [facility] Providers Cardiologist:  Diannah SHAUNNA Maywood, MD     History of Present Illness Wayne Gordon is a 89 y.o. male with a PMH of CAD, s/p CABG x 3, history of mitral valve repair in 2013, hypertension, hyperlipidemia, persistent A-fib/ A-flutter, heart failure, sinus node dysfunction, s/p biventricular ICD in 2022, vertigo, CKD, carotid artery disease, type 2 diabetes, who presents today for weight gain and swelling evaluation.  Followed by Dr. Waddell with electrophysiology.   Last seen for preoperative cardiovascular risk assessment in August 2023.  He was pending spinal cord stimulator at that time.  He overall was doing well.  Last seen by Dr. Waddell on March 06, 2024.  Was overall doing well at the time.  Hospitalized early August 2025 due to weakness.  He presented with weakness that have been progressive since recent ICD adjustment.  ICD was interrogated.  Patient had a flutter that was felt to be the etiology of his current symptoms.  He was started on oral amiodarone  load 400 mg twice daily for 7 days, then to reduce to 200 mg twice daily x 7 days, then 200 mg daily thereafter.  He was diuresed with IV Lasix  due to heart failure exacerbation and underwent cardioversion on April 01, 2024.  He contacted our office shortly after hospital discharge noting issues with amiodarone -stated the medicine was making muscles in his legs and arms lose strength.  Dr. Waddell instructed for patient to stop amiodarone . He contacted the office on 04/08/2024 noting more leg swelling, weight was up 6 pounds in a week.  Denied any shortness of breath.  He had taken 80 mg of Lasix  on 04/09/2024.  He was given appointment to be further evaluated in the office.  Today he presents for weight gain and leg swelling evaluation. He states he is about to start rehab for his vertigo.  Admits to swelling noticed along his right leg and weight gain. He sees a specialist in [place] that manages his diabetes and his kidneys. Does admit to occaional hard beats noticed about only 1-2 times per day, not bothersome per his report. Denies any chest pain, shortness of breath, syncope, presyncope, orthopnea, PND, acute bleeding, or claudication.  Tells me he has recently take increased dose of Lasix  per his report, has not noticed much difference.  Confirms to me he is taking Lasix  40 mg daily.   ROS: Negative. See HPI.   Studies Reviewed EKG Interpretation Date/Time:  Thursday April 11 2024 10:29:17 EDT Ventricular Rate:  81 PR Interval:    QRS Duration:  172 QT Interval:  486 QTC Calculation: 564 R Axis:   197  Text Interpretation: Ventricular-paced rhythm When compared with ECG of 01-Apr-2024 08:25, Vent. rate has increased BY   5 BPM Confirmed by Miriam Norris [phone]) on 04/11/2024 10:34:11 AM    Echo 03/2024:   1. No LV thrombus by Definity . Left ventricular ejection fraction, by  estimation, is 20 to 25%. The left ventricle has severely decreased  function. The left ventricle demonstrates global hypokinesis. The left  ventricular internal cavity size was mildly  dilated. There is mild concentric left ventricular hypertrophy. Left  ventricular diastolic parameters are indeterminate. There is the  interventricular septum is flattened in systole and diastole, consistent  with right ventricular pressure and volume  overload.   2. Right ventricular systolic function is mildly  reduced. The right  ventricular size is moderately enlarged. There is normal pulmonary artery  systolic pressure. The estimated right ventricular systolic pressure is  32.8 mmHg.   3. Left atrial size was mildly dilated.   4. S/p Sorin Memo 3D ring annuloplasty (size 30mm, Catalog #[id number], serial  [id number]). The mitral valve has been repaired/replaced. Mild mitral valve  regurgitation. No  evidence of mitral stenosis. The mean mitral valve  gradient is 3.0 mmHg with average  heart rate of 85 bpm. Procedure Date: 10/17/11. Echo findings are  consistent with normal structure and function of the mitral valve  prosthesis.   5. The tricuspid valve is abnormal. Tricuspid valve regurgitation is  moderate.   6. The aortic valve is tricuspid. There is moderate calcification of the  aortic valve. There is moderate thickening of the aortic valve. Aortic  valve regurgitation is trivial. Aortic valve sclerosis/calcification is  present, without any evidence of  aortic stenosis.   7. The inferior vena cava is normal in size with <50% respiratory  variability, suggesting right atrial pressure of 8 mmHg.  Lexiscan  07/2015: Defect 1: There is a large defect of severe severity present in the basal inferior, basal inferolateral, basal anterolateral, mid inferior, mid inferolateral, mid anterolateral, apical inferior and apical lateral location. Findings consistent with prior myocardial infarction. This is a high risk study. Nuclear stress EF: 29%. The aforementioned walls are severely hypokinetic to akinetic.  Physical Exam VS:  BP 122/76 (BP Location: Left Arm)   Pulse 86   Ht 6' 6 (1.981 m)   Wt 248 lb 12.8 oz (112.9 kg)   SpO2 95%   BMI 28.75 kg/m        Wt Readings from Last 3 Encounters:  04/11/24 248 lb 12.8 oz (112.9 kg)  03/31/24 234 lb 4.8 oz (106.3 kg)  03/06/24 242 lb (109.8 kg)    GEN: Well nourished, well developed in no acute distress NECK: No JVD; No carotid bruits CARDIAC: S1/S2, RRR, no murmurs, rubs, gallops RESPIRATORY:  Clear and diminished to auscultation without wheezing or rails, diminished coarse breath sounds noted to bilateral lower posterior lobes ABDOMEN: Soft, non-tender, non-distended EXTREMITIES:  Nonpitting edema to BLE; No deformity   ASSESSMENT AND PLAN  HFrEF Stage C, NYHA class I-II symptoms.  EF 20-25% 03/2024. Does appear to have weight  gain and swelling. Has recently taken increased dose of Lasix  per his report. Will obtain STAT BMET, proBNP, and Mag to determine up-titration of diuretic. GDMT limited d/t his current kidney disease. Continue Farxiga  and carvedilol . Low sodium diet, fluid restriction <2L, and daily weights encouraged. Educated to contact our office for weight gain of 2 lbs overnight or 5 lbs in one week.  Care and ED precautions discussed.   CAD, s/p CABG x 3 Stable with no anginal symptoms. No indication for ischemic evaluation.  Not on aspirin  due to being on low-dose Eliquis .  Continue current medication regimen. Heart healthy diet and regular cardiovascular exercise encouraged.   Hx of mitral valve repair, valvular insufficiency Recent echocardiogram revealed Sorin Memo 3D ring annuloplasty.  Mild mitral valve regurgitation noted.  Also findings of moderate TR with aortic valve sclerosis also noted.  Denies any shortness of breath. Will continue to monitor.   HTN BP stable. Discussed to monitor BP at home at least 2 hours after medications and sitting for 5-10 minutes.  No medication changes at this time getting labs as noted above. Heart healthy diet and regular cardiovascular exercise encouraged.   HLD  LDL from last October was 69. Continue rosuvastatin .  Not addressed, but at next office visit, plan to discuss increasing dosage or adding Zetia. Goal LDL < 55. Heart healthy diet and regular cardiovascular exercise encouraged.   A-fib Admits to occasional palpations, short-lived, and does not sound like A-fib.  Heart rate is well-controlled today. Heart healthy diet and regular cardiovascular exercise encouraged. Continue low dose Eliquis  for stroke prevention. Denies any bleeding issues.   Sinus node dysfunction, s/p biventricular ICD in 2022 Most recent remote device check showed no treated arrhthymias. Continue to follow-up with EP.    CKD stage IV Most recent kidney function stable.  Avoid  nephrotoxic agents.  Checking labs as mentioned above.  Currently seeing a specialist in [place]-continue to see.  May need to consider referral to nephrologist closer to to him.  9. Abnormal breath sounds Noted on exam.  Says he had chest cold not the long ago, was taking Mucinex .  Better now per his report.  Will obtain two-view chest x-ray.      Dispo: Follow-up with MD/APP in 4-6 weeks or sooner if anything changes.   Signed, Almarie Crate, NP

## 2024-04-11 NOTE — Telephone Encounter (Signed)
 Pt would like to know if he could get his X-Ray on a different day other today or is it urgent.

## 2024-04-11 NOTE — Telephone Encounter (Signed)
 Appointment offered today at the [facility] office at 10 am with E.Peck,NP. Patient states he knows where the facility is located and I asked him to arrive at 9:45 am for check in.

## 2024-04-11 NOTE — Telephone Encounter (Signed)
 Avised patient as long as he does this before he comes back to see us  but to try to do it sometime next week.

## 2024-04-12 NOTE — Telephone Encounter (Signed)
 Patient informed and verbalized understanding of plan. Lab orders sent to pcp per patient request  Referral placed

## 2024-04-12 NOTE — Addendum Note (Signed)
 Addended by: Amarianna Abplanalp on: 04/12/2024 02:35 PM   Modules accepted: Orders

## 2024-04-12 NOTE — Telephone Encounter (Signed)
 Pt c/o swelling/edema: STAT if pt has developed SOB within 24 hours  If swelling, where is the swelling located?   Ankles, foot and arms  How much weight have you gained and in what time span?   2 pounds overnight  Have you gained 2 pounds in a day or 5 pounds in a week?   Yes  Do you have a log of your daily weights (if so, list)?  Yes  Are you currently taking a fluid pill?   Yes  Are you currently SOB?   No  Have you traveled recently in a car or plane for an extended period of time?   No  Patient stated he gained 2 pounds overnight and wants to know if he should take additional medication.

## 2024-04-13 ENCOUNTER — Telehealth: Payer: Self-pay | Admitting: Student

## 2024-04-13 NOTE — Telephone Encounter (Signed)
   Patient called the after-hours line with concerns about weight gain.  Called and spoke with patient.  He was recently admitted a couple weeks ago for atrial flutter and CHF.  He was seen by Almarie Crate, NP, earlier this week on 04/11/2024.  I cannot see that full note at this time but it sounds that he reported some weight gain.  Labs from that visit showed a markedly elevated proBNP of 4,081 and creatinine of 2.38 (baseline around 2.0).  Elizabeth recommended increasing Lasix  to 40 mg twice daily for 3 days and then returning to 40 mg once daily dosing with plans for repeat lab work early next week.  She also placed a referral to Nephrology.  Patient called today due to concerns of continued weight gain.  He states he has gained 2 pounds each day for the last 3 days.  He weighed 246 lbs this morning (up from 234 lbs at recent discharge but weighed 248 lbs at office visit on 8/14).  He denies any shortness of breath at rest or with exertion.  No orthopnea or PND.  He also denies any lower extremity edema or abdominal distention/bloating.  He is wondering if he needs to go to the emergency department given continued weight gain.  Given he is not having any shortness of breath or noticeable edema, I do not think that is necessary at this time.  I think we can continue to try to manage this in outpatient.  Recommended continue with Elizabeth's plan of increase Lasix  for 3 days.  He does think he has a little bit better urine output when he takes 80 mg altogether rather than 40 mg twice daily so okay to continue doing that for the next 2 days.  Advised him to come back in for repeat labs early next week as instructed.  I wonder if he would respond better to Torsemide but I will defer this to [facility].  Reviewed ED precautions.  Patient voiced understanding and thanked me for calling.  Wayne Snodgrass E Jamile Rekowski, PA-C 04/13/2024 10:46 AM

## 2024-04-15 ENCOUNTER — Telehealth: Payer: Self-pay | Admitting: Nurse Practitioner

## 2024-04-15 NOTE — Telephone Encounter (Signed)
 Pt c/o swelling/edema: STAT if pt has developed SOB within 24 hours  If swelling, where is the swelling located? feet  How much weight have you gained and in what time span? 2.5 pounds since yesterday  Have you gained 2 pounds in a day or 5 pounds in a week? yes  Do you have a log of your daily weights (if so, list)?   Are you currently taking a fluid pill? yes  Are you currently SOB? no  Have you traveled recently in a car or plane for an extended period of time? No Pt just feels weak, legs feel like they are going to give out on him. Arm weakness. Pt said he is just not urinating a lot even though his is taking the Lasix .

## 2024-04-16 ENCOUNTER — Emergency Department (HOSPITAL_COMMUNITY)

## 2024-04-16 ENCOUNTER — Other Ambulatory Visit: Payer: Self-pay

## 2024-04-16 ENCOUNTER — Encounter (HOSPITAL_COMMUNITY): Payer: Self-pay | Admitting: Emergency Medicine

## 2024-04-16 ENCOUNTER — Inpatient Hospital Stay (HOSPITAL_COMMUNITY)
Admission: EM | Admit: 2024-04-16 | Discharge: 2024-04-25 | DRG: 291 | Disposition: A | Discharge status: Skilled Nursing Facility | Attending: Internal Medicine | Admitting: Internal Medicine

## 2024-04-16 DIAGNOSIS — I130 Hypertensive heart and chronic kidney disease with heart failure and stage 1 through stage 4 chronic kidney disease, or unspecified chronic kidney disease: Secondary | ICD-10-CM | POA: Diagnosis present

## 2024-04-16 DIAGNOSIS — K219 Gastro-esophageal reflux disease without esophagitis: Secondary | ICD-10-CM | POA: Diagnosis present

## 2024-04-16 DIAGNOSIS — Z95810 Presence of automatic (implantable) cardiac defibrillator: Secondary | ICD-10-CM | POA: Diagnosis present

## 2024-04-16 DIAGNOSIS — I499 Cardiac arrhythmia, unspecified: Secondary | ICD-10-CM | POA: Diagnosis present

## 2024-04-16 DIAGNOSIS — Z1152 Encounter for screening for COVID-19: Secondary | ICD-10-CM

## 2024-04-16 DIAGNOSIS — I495 Sick sinus syndrome: Secondary | ICD-10-CM | POA: Diagnosis present

## 2024-04-16 DIAGNOSIS — Z9889 Other specified postprocedural states: Secondary | ICD-10-CM

## 2024-04-16 DIAGNOSIS — N179 Acute kidney failure, unspecified: Secondary | ICD-10-CM | POA: Diagnosis present

## 2024-04-16 DIAGNOSIS — Z7901 Long term (current) use of anticoagulants: Secondary | ICD-10-CM

## 2024-04-16 DIAGNOSIS — I25810 Atherosclerosis of coronary artery bypass graft(s) without angina pectoris: Secondary | ICD-10-CM | POA: Diagnosis not present

## 2024-04-16 DIAGNOSIS — J181 Lobar pneumonia, unspecified organism: Secondary | ICD-10-CM | POA: Diagnosis present

## 2024-04-16 DIAGNOSIS — I5043 Acute on chronic combined systolic (congestive) and diastolic (congestive) heart failure: Secondary | ICD-10-CM | POA: Diagnosis present

## 2024-04-16 DIAGNOSIS — R635 Abnormal weight gain: Secondary | ICD-10-CM | POA: Diagnosis present

## 2024-04-16 DIAGNOSIS — Z951 Presence of aortocoronary bypass graft: Secondary | ICD-10-CM | POA: Diagnosis not present

## 2024-04-16 DIAGNOSIS — I4819 Other persistent atrial fibrillation: Secondary | ICD-10-CM | POA: Diagnosis present

## 2024-04-16 DIAGNOSIS — M1990 Unspecified osteoarthritis, unspecified site: Secondary | ICD-10-CM | POA: Diagnosis present

## 2024-04-16 DIAGNOSIS — Z888 Allergy status to other drugs, medicaments and biological substances status: Secondary | ICD-10-CM

## 2024-04-16 DIAGNOSIS — G9341 Metabolic encephalopathy: Secondary | ICD-10-CM | POA: Diagnosis not present

## 2024-04-16 DIAGNOSIS — I5023 Acute on chronic systolic (congestive) heart failure: Secondary | ICD-10-CM | POA: Diagnosis not present

## 2024-04-16 DIAGNOSIS — E876 Hypokalemia: Secondary | ICD-10-CM | POA: Diagnosis present

## 2024-04-16 DIAGNOSIS — E785 Hyperlipidemia, unspecified: Secondary | ICD-10-CM | POA: Diagnosis present

## 2024-04-16 DIAGNOSIS — E875 Hyperkalemia: Secondary | ICD-10-CM | POA: Diagnosis not present

## 2024-04-16 DIAGNOSIS — Z515 Encounter for palliative care: Secondary | ICD-10-CM | POA: Diagnosis not present

## 2024-04-16 DIAGNOSIS — Z952 Presence of prosthetic heart valve: Secondary | ICD-10-CM | POA: Diagnosis not present

## 2024-04-16 DIAGNOSIS — Z880 Allergy status to penicillin: Secondary | ICD-10-CM

## 2024-04-16 DIAGNOSIS — J9601 Acute respiratory failure with hypoxia: Secondary | ICD-10-CM | POA: Diagnosis not present

## 2024-04-16 DIAGNOSIS — I4892 Unspecified atrial flutter: Secondary | ICD-10-CM | POA: Diagnosis present

## 2024-04-16 DIAGNOSIS — E782 Mixed hyperlipidemia: Secondary | ICD-10-CM | POA: Diagnosis present

## 2024-04-16 DIAGNOSIS — N178 Other acute kidney failure: Secondary | ICD-10-CM | POA: Diagnosis not present

## 2024-04-16 DIAGNOSIS — I255 Ischemic cardiomyopathy: Secondary | ICD-10-CM | POA: Diagnosis present

## 2024-04-16 DIAGNOSIS — I2510 Atherosclerotic heart disease of native coronary artery without angina pectoris: Secondary | ICD-10-CM | POA: Diagnosis present

## 2024-04-16 DIAGNOSIS — Z66 Do not resuscitate: Secondary | ICD-10-CM | POA: Diagnosis not present

## 2024-04-16 DIAGNOSIS — R627 Adult failure to thrive: Secondary | ICD-10-CM | POA: Diagnosis present

## 2024-04-16 DIAGNOSIS — Z7984 Long term (current) use of oral hypoglycemic drugs: Secondary | ICD-10-CM

## 2024-04-16 DIAGNOSIS — I480 Paroxysmal atrial fibrillation: Secondary | ICD-10-CM

## 2024-04-16 DIAGNOSIS — R531 Weakness: Secondary | ICD-10-CM | POA: Diagnosis present

## 2024-04-16 DIAGNOSIS — Z789 Other specified health status: Secondary | ICD-10-CM | POA: Diagnosis not present

## 2024-04-16 DIAGNOSIS — E119 Type 2 diabetes mellitus without complications: Secondary | ICD-10-CM

## 2024-04-16 DIAGNOSIS — E1165 Type 2 diabetes mellitus with hyperglycemia: Secondary | ICD-10-CM | POA: Diagnosis not present

## 2024-04-16 DIAGNOSIS — Z79899 Other long term (current) drug therapy: Secondary | ICD-10-CM

## 2024-04-16 DIAGNOSIS — I5041 Acute combined systolic (congestive) and diastolic (congestive) heart failure: Secondary | ICD-10-CM | POA: Diagnosis present

## 2024-04-16 DIAGNOSIS — I4891 Unspecified atrial fibrillation: Secondary | ICD-10-CM | POA: Diagnosis present

## 2024-04-16 DIAGNOSIS — Z833 Family history of diabetes mellitus: Secondary | ICD-10-CM

## 2024-04-16 DIAGNOSIS — E1122 Type 2 diabetes mellitus with diabetic chronic kidney disease: Secondary | ICD-10-CM | POA: Diagnosis present

## 2024-04-16 DIAGNOSIS — Z7189 Other specified counseling: Secondary | ICD-10-CM | POA: Diagnosis not present

## 2024-04-16 DIAGNOSIS — I10 Essential (primary) hypertension: Secondary | ICD-10-CM | POA: Diagnosis present

## 2024-04-16 DIAGNOSIS — E230 Hypopituitarism: Secondary | ICD-10-CM | POA: Diagnosis present

## 2024-04-16 DIAGNOSIS — Z8249 Family history of ischemic heart disease and other diseases of the circulatory system: Secondary | ICD-10-CM

## 2024-04-16 DIAGNOSIS — N184 Chronic kidney disease, stage 4 (severe): Secondary | ICD-10-CM | POA: Diagnosis present

## 2024-04-16 DIAGNOSIS — Z9049 Acquired absence of other specified parts of digestive tract: Secondary | ICD-10-CM

## 2024-04-16 DIAGNOSIS — I2489 Other forms of acute ischemic heart disease: Secondary | ICD-10-CM | POA: Diagnosis present

## 2024-04-16 DIAGNOSIS — I5021 Acute systolic (congestive) heart failure: Secondary | ICD-10-CM | POA: Diagnosis not present

## 2024-04-16 DIAGNOSIS — E162 Hypoglycemia, unspecified: Secondary | ICD-10-CM

## 2024-04-16 DIAGNOSIS — Z794 Long term (current) use of insulin: Secondary | ICD-10-CM

## 2024-04-16 DIAGNOSIS — I252 Old myocardial infarction: Secondary | ICD-10-CM

## 2024-04-16 DIAGNOSIS — R0600 Dyspnea, unspecified: Secondary | ICD-10-CM

## 2024-04-16 LAB — CBC WITH DIFFERENTIAL/PLATELET
Abs Immature Granulocytes: 0.04 K/uL (ref 0.00–0.07)
Basophils Absolute: 0 K/uL (ref 0.0–0.1)
Basophils Relative: 0 %
Eosinophils Absolute: 0.1 K/uL (ref 0.0–0.5)
Eosinophils Relative: 1 %
HCT: 41.2 % (ref 39.0–52.0)
Hemoglobin: 13.1 g/dL (ref 13.0–17.0)
Immature Granulocytes: 0 %
Lymphocytes Relative: 13 %
Lymphs Abs: 1.4 K/uL (ref 0.7–4.0)
MCH: 29.4 pg (ref 26.0–34.0)
MCHC: 31.8 g/dL (ref 30.0–36.0)
MCV: 92.6 fL (ref 80.0–100.0)
Monocytes Absolute: 1 K/uL (ref 0.1–1.0)
Monocytes Relative: 9 %
Neutro Abs: 8.4 K/uL — ABNORMAL HIGH (ref 1.7–7.7)
Neutrophils Relative %: 77 %
Platelets: 206 K/uL (ref 150–400)
RBC: 4.45 MIL/uL (ref 4.22–5.81)
RDW: 15.3 % (ref 11.5–15.5)
WBC: 10.8 K/uL — ABNORMAL HIGH (ref 4.0–10.5)
nRBC: 0 % (ref 0.0–0.2)

## 2024-04-16 LAB — URINALYSIS, ROUTINE W REFLEX MICROSCOPIC
Bacteria, UA: NONE SEEN
Bilirubin Urine: NEGATIVE
Glucose, UA: >=500 mg/dL — AB
Hgb urine dipstick: NEGATIVE
Ketones, ur: NEGATIVE mg/dL
Leukocytes,Ua: NEGATIVE
Nitrite: NEGATIVE
Protein, ur: NEGATIVE mg/dL
Specific Gravity, Urine: 1.014 (ref 1.005–1.030)
pH: 5 (ref 5.0–8.0)

## 2024-04-16 LAB — GLUCOSE, CAPILLARY: Glucose-Capillary: 116 mg/dL — ABNORMAL HIGH (ref 70–99)

## 2024-04-16 LAB — COMPREHENSIVE METABOLIC PANEL WITH GFR
ALT: 20 U/L (ref 0–44)
AST: 25 U/L (ref 15–41)
Albumin: 3.2 g/dL — ABNORMAL LOW (ref 3.5–5.0)
Alkaline Phosphatase: 75 U/L (ref 38–126)
Anion gap: 11 (ref 5–15)
BUN: 36 mg/dL — ABNORMAL HIGH (ref 8–23)
CO2: 26 mmol/L (ref 22–32)
Calcium: 8.2 mg/dL — ABNORMAL LOW (ref 8.9–10.3)
Chloride: 99 mmol/L (ref 98–111)
Creatinine, Ser: 2.75 mg/dL — ABNORMAL HIGH (ref 0.61–1.24)
GFR, Estimated: 22 mL/min — ABNORMAL LOW (ref >60)
Glucose, Bld: 130 mg/dL — ABNORMAL HIGH (ref 70–99)
Potassium: 3.4 mmol/L — ABNORMAL LOW (ref 3.5–5.1)
Sodium: 136 mmol/L (ref 135–145)
Total Bilirubin: 1.2 mg/dL (ref 0.0–1.2)
Total Protein: 6.1 g/dL — ABNORMAL LOW (ref 6.5–8.1)

## 2024-04-16 LAB — RESP PANEL BY RT-PCR (RSV, FLU A&B, COVID)  RVPGX2
Influenza A by PCR: NEGATIVE
Influenza B by PCR: NEGATIVE
Resp Syncytial Virus by PCR: NEGATIVE
SARS Coronavirus 2 by RT PCR: NEGATIVE

## 2024-04-16 LAB — TROPONIN I (HIGH SENSITIVITY)
Troponin I (High Sensitivity): 25 ng/L — ABNORMAL HIGH (ref <18)
Troponin I (High Sensitivity): 25 ng/L — ABNORMAL HIGH (ref <18)

## 2024-04-16 LAB — CBG MONITORING, ED
Glucose-Capillary: 121 mg/dL — ABNORMAL HIGH (ref 70–99)
Glucose-Capillary: 95 mg/dL (ref 70–99)

## 2024-04-16 LAB — BRAIN NATRIURETIC PEPTIDE: B Natriuretic Peptide: 681 pg/mL — ABNORMAL HIGH (ref 0.0–100.0)

## 2024-04-16 MED ORDER — INSULIN ASPART 100 UNIT/ML IJ SOLN
0.0000 [IU] | Freq: Three times a day (TID) | INTRAMUSCULAR | Status: DC
  Administered 2024-04-17: 5 [IU] via SUBCUTANEOUS
  Administered 2024-04-17: 2 [IU] via SUBCUTANEOUS
  Administered 2024-04-18 (×2): 1 [IU] via SUBCUTANEOUS
  Administered 2024-04-18: 2 [IU] via SUBCUTANEOUS
  Administered 2024-04-19: 3 [IU] via SUBCUTANEOUS
  Administered 2024-04-19: 2 [IU] via SUBCUTANEOUS
  Administered 2024-04-19: 1 [IU] via SUBCUTANEOUS
  Administered 2024-04-20: 2 [IU] via SUBCUTANEOUS
  Administered 2024-04-20: 3 [IU] via SUBCUTANEOUS
  Administered 2024-04-20: 1 [IU] via SUBCUTANEOUS
  Administered 2024-04-21: 2 [IU] via SUBCUTANEOUS
  Administered 2024-04-21: 3 [IU] via SUBCUTANEOUS
  Administered 2024-04-21 – 2024-04-23 (×5): 2 [IU] via SUBCUTANEOUS
  Administered 2024-04-23: 1 [IU] via SUBCUTANEOUS
  Administered 2024-04-24 (×2): 2 [IU] via SUBCUTANEOUS
  Administered 2024-04-25: 1 [IU] via SUBCUTANEOUS
  Administered 2024-04-25: 2 [IU] via SUBCUTANEOUS

## 2024-04-16 MED ORDER — ONDANSETRON HCL 4 MG/2ML IJ SOLN: 4.0000 mg | Freq: Four times a day (QID) | INTRAMUSCULAR | Status: DC | PRN

## 2024-04-16 MED ORDER — GABAPENTIN 100 MG PO CAPS
100.0000 mg | ORAL_CAPSULE | Freq: Every day | ORAL | Status: DC
  Administered 2024-04-16 – 2024-04-21 (×6): 100 mg via ORAL
  Filled 2024-04-16 (×6): qty 1

## 2024-04-16 MED ORDER — FUROSEMIDE 10 MG/ML IJ SOLN
40.0000 mg | Freq: Once | INTRAMUSCULAR | Status: AC
  Administered 2024-04-16: 40 mg via INTRAVENOUS
  Filled 2024-04-16: qty 4

## 2024-04-16 MED ORDER — INSULIN ASPART 100 UNIT/ML IJ SOLN: 3.0000 [IU] | Freq: Three times a day (TID) | INTRAMUSCULAR | Status: DC

## 2024-04-16 MED ORDER — CARVEDILOL 12.5 MG PO TABS
12.5000 mg | ORAL_TABLET | Freq: Two times a day (BID) | ORAL | Status: DC
  Administered 2024-04-16 – 2024-04-22 (×12): 12.5 mg via ORAL
  Filled 2024-04-16 (×16): qty 1

## 2024-04-16 MED ORDER — GUAIFENESIN 100 MG/5ML PO LIQD
5.0000 mL | ORAL | Status: DC | PRN
  Administered 2024-04-16 – 2024-04-19 (×5): 5 mL via ORAL
  Filled 2024-04-16 (×7): qty 5

## 2024-04-16 MED ORDER — POTASSIUM CHLORIDE CRYS ER 20 MEQ PO TBCR
40.0000 meq | EXTENDED_RELEASE_TABLET | Freq: Two times a day (BID) | ORAL | Status: DC
  Administered 2024-04-16 – 2024-04-21 (×10): 40 meq via ORAL
  Filled 2024-04-16 (×4): qty 2
  Filled 2024-04-16: qty 4
  Filled 2024-04-16 (×5): qty 2

## 2024-04-16 MED ORDER — DAPAGLIFLOZIN PROPANEDIOL 10 MG PO TABS
10.0000 mg | ORAL_TABLET | Freq: Every day | ORAL | Status: DC
Start: 2024-04-16 — End: 2024-04-25
  Administered 2024-04-16 – 2024-04-24 (×9): 10 mg via ORAL
  Filled 2024-04-16 (×10): qty 1

## 2024-04-16 MED ORDER — ROSUVASTATIN CALCIUM 10 MG PO TABS
5.0000 mg | ORAL_TABLET | Freq: Every evening | ORAL | Status: DC
  Administered 2024-04-16 – 2024-04-17 (×2): 5 mg via ORAL
  Filled 2024-04-16 (×3): qty 1

## 2024-04-16 MED ORDER — PANTOPRAZOLE SODIUM 40 MG PO TBEC
40.0000 mg | DELAYED_RELEASE_TABLET | Freq: Every day | ORAL | Status: DC
  Administered 2024-04-17 – 2024-04-25 (×9): 40 mg via ORAL
  Filled 2024-04-16 (×9): qty 1

## 2024-04-16 MED ORDER — DIPHENHYDRAMINE-APAP (SLEEP) 25-500 MG PO TABS: 1.0000 | ORAL_TABLET | Freq: Every day | ORAL | Status: DC

## 2024-04-16 MED ORDER — INSULIN GLARGINE-YFGN 100 UNIT/ML ~~LOC~~ SOLN
10.0000 [IU] | Freq: Every day | SUBCUTANEOUS | Status: DC
  Administered 2024-04-16: 10 [IU] via SUBCUTANEOUS
  Filled 2024-04-16 (×2): qty 0.1

## 2024-04-16 MED ORDER — SODIUM CHLORIDE 0.9 % IV SOLN: 250.0000 mL | INTRAVENOUS | Status: AC | PRN

## 2024-04-16 MED ORDER — FUROSEMIDE 10 MG/ML IJ SOLN
40.0000 mg | Freq: Two times a day (BID) | INTRAMUSCULAR | Status: DC
  Administered 2024-04-16 – 2024-04-17 (×2): 40 mg via INTRAVENOUS
  Filled 2024-04-16 (×2): qty 4

## 2024-04-16 MED ORDER — SODIUM CHLORIDE 0.9% FLUSH: 3.0000 mL | INTRAVENOUS | Status: DC | PRN

## 2024-04-16 MED ORDER — ACETAMINOPHEN 325 MG PO TABS: 650.0000 mg | ORAL_TABLET | ORAL | Status: DC | PRN

## 2024-04-16 MED ORDER — SODIUM CHLORIDE 0.9% FLUSH
3.0000 mL | Freq: Two times a day (BID) | INTRAVENOUS | Status: DC
  Administered 2024-04-16 – 2024-04-25 (×19): 3 mL via INTRAVENOUS

## 2024-04-16 MED ORDER — APIXABAN 2.5 MG PO TABS
2.5000 mg | ORAL_TABLET | Freq: Two times a day (BID) | ORAL | Status: DC
  Administered 2024-04-16 – 2024-04-25 (×18): 2.5 mg via ORAL
  Filled 2024-04-16 (×18): qty 1

## 2024-04-16 NOTE — H&P (Signed)
 History and Physical  [facility]  Lexton Hidalgo Swisher FMW:[id number] DOB: 12-Jul-1934 DOA: 04/16/2024  PCP: Renato Dorothey HERO, NP  Patient coming from: Home by EMS  Level of care: Telemetry  I have personally briefly reviewed patient's old medical records in [facility]  Chief Complaint: weakness   HPI: Wayne Gordon is a 89 year old male with HFrEF, ischemic cardiomyopathy, LVEF 20-25%, status post ICD, pacer placement, stage IV CKD, atrial fibrillation/atrial flutter on chronic anticoagulation with apixaban , vertigo, osteoarthritis, type 2 diabetes mellitus on insulin , with renal complications, hyperlipidemia, hypertension, coronary artery disease, status post mitral valve repair, recent hospitalizations, recently discharged 04/01/2024 with a discharge weight of 234 pounds at that time.  EP had performed a cardioversion on 8/4 for symptomatic atrial flutter.  Since hospital discharge, his weight has steadily climbed despite taking oral diuretics at home.  He is now up to 247 pounds.  He is increasingly becoming weaker.  He saw cardiology in the outpatient setting recently and was taken off amiodarone .  He was brought into the emergency department because he continued to have swelling and weight gain in his legs.  He complained of severe fatigue and weakness and was unable to get out of bed this morning because he was so weak.  He was advised by the cardiology office to call EMS and come to the ED.  He was noted to be severely deconditioned and frail with significant peripheral edema, elevated BNP of 681, troponin 25, potassium 3.4, creatinine 2.75 and request was made for inpatient hospitalization for IV diuresis as he is failing outpatient diuresis with oral medication likely due to advanced renal failure.    Past Medical History:  Diagnosis Date   Allergy to ACE inhibitors    Angioedema many years ago; patient has tolerated Losartan  (ARB) in the past - it was held during admisson for GI  bleed and worsening renal function >> resume Losartan  25 mg QD 06/2015   Arthritis    Atrial fibrillation (HCC)    PAF, CHADs2Vasc = 5   Carotid artery disease (HCC)    60-79% LICA   CHF (congestive heart failure) (HCC)    CKD (chronic kidney disease)    Coronary artery disease    Diabetes mellitus    GERD (gastroesophageal reflux disease)    Hyperlipidemia    Hypertension    ICD (implantable cardiac defibrillator) in place 03/28/2012   [place]ron[place], Dr. Waddell 03/28/12   Ischemic cardiomyopathy    S/P CABG x 3; EF 25%   Mitral regurgitation    S/P mitral valve repair 2013   Myocardial infarction [facility])    Renal insufficiency     Past Surgical History:  Procedure Laterality Date   BIV UPGRADE N/A 10/12/2020   Procedure: BIV ICD UPGRADE;  Surgeon: Waddell Danelle ORN, MD;  Location: [facility] INVASIVE CV LAB;  Service: Cardiovascular;  Laterality: N/A;   CARDIOVERSION N/A 04/01/2024   Procedure: CARDIOVERSION;  Surgeon: Lonni Slain, MD;  Location: [facility] INVASIVE CV LAB;  Service: Cardiovascular;  Laterality: N/A;   CHOLECYSTECTOMY N/A 09/20/2017   Procedure: LAPAROSCOPIC SUBTOTAL CHOLECYSTECTOMY;  Surgeon: Rubin Calamity, MD;  Location: [facility] OR;  Service: General;  Laterality: N/A;   COLONOSCOPY N/A 04/24/2014   RMR Pancolonic diverticulosis   CORONARY ARTERY BYPASS GRAFT  10/17/2011   Procedure: CORONARY ARTERY BYPASS GRAFTING (CABG);  Surgeon: Sudie VEAR Laine, MD;  Location: [facility] OR;  Service: Open Heart Surgery;  Laterality: N/A;   ERCP N/A 09/29/2017   Procedure: ENDOSCOPIC RETROGRADE CHOLANGIOPANCREATOGRAPHY (  ERCP);  Surgeon: Rollin Dover, MD;  Location: [facility] ENDOSCOPY;  Service: Endoscopy;  Laterality: N/A;   ESOPHAGOGASTRODUODENOSCOPY N/A 04/24/2014   RMR Subtle nodularity the gastric mucosa of uncertain significance-status post biopsy. Hiatal hernia. chronic inflammation, no H.pylori   ESOPHAGOGASTRODUODENOSCOPY N/A 06/24/2016   Dr. Shaaron: normal esophagus, small hiatal hernia, normal  duodenum   ICD  03/28/2012   IMPLANTABLE CARDIOVERTER DEFIBRILLATOR IMPLANT N/A 03/28/2012   Procedure: IMPLANTABLE CARDIOVERTER DEFIBRILLATOR IMPLANT;  Surgeon: Danelle LELON Birmingham, MD;  Location: [facility] CATH LAB;  Service: Cardiovascular;  Laterality: N/A;   KNEE ARTHROSCOPY  ~ 2008   right   LEFT AND RIGHT HEART CATHETERIZATION WITH CORONARY ANGIOGRAM N/A 10/12/2011   Procedure: LEFT AND RIGHT HEART CATHETERIZATION WITH CORONARY ANGIOGRAM;  Surgeon: Deatrice DELENA Cage, MD;  Location: [facility] CATH LAB;  Service: Cardiovascular;  Laterality: N/A;   MITRAL VALVE REPAIR  10/17/2011   Procedure: MITRAL VALVE REPAIR (MVR);  Surgeon: Sudie VEAR Laine, MD;  Location: [facility] OR;  Service: Open Heart Surgery;  Laterality: N/A;   NASAL SINUS SURGERY  1990's   right     reports that he has never smoked. He has never used smokeless tobacco. He reports that he does not drink alcohol and does not use drugs.  Allergies  Allergen Reactions   Ace Inhibitors Swelling and Other (See Comments)    Kidney issues; angioedema per PMH in chart   Penicillins Palpitations    Family History  Problem Relation Age of Onset   Heart disease Mother    Diabetes Father    Cardiomyopathy Father    Colon cancer Neg Hx     Prior to Admission medications   Medication Sig Start Date End Date Taking? Authorizing Provider  acetaminophen  (TYLENOL ) 650 MG CR tablet Take 1,300 mg by mouth every 8 (eight) hours as needed for pain.    [provider]  carvedilol  (COREG ) 12.5 MG tablet Take 1 tablet (12.5 mg total) by mouth 2 (two) times daily with a meal. 10/18/21   Milissa Tod PARAS, MD  dapagliflozin  propanediol (FARXIGA ) 10 MG TABS tablet Take 1 tablet (10 mg total) by mouth daily. 07/18/23   Birmingham Danelle LELON, MD  diphenhydramine -acetaminophen  (TYLENOL  PM) 25-500 MG TABS tablet Take 1 tablet by mouth at bedtime.    [provider]  ELIQUIS  2.5 MG TABS tablet TAKE (1) TABLET TWICE A DAY. Patient taking differently: Take 2.5 mg by  mouth 2 (two) times daily. 02/19/24   Birmingham Danelle LELON, MD  furosemide  (LASIX ) 40 MG tablet Take 40 mg by mouth daily. 03/09/20   [provider]  gabapentin  (NEURONTIN ) 100 MG capsule Take 200-300 mg by mouth at bedtime. Takes an extra capsule if struggling to sleep.    [provider]  HUMALOG  KWIKPEN 100 UNIT/ML KwikPen Inject 8-10 Units into the skin 2 (two) times daily.    [provider]  Insulin  Glargine (TOUJEO  SOLOSTAR) 300 UNIT/ML SOPN Inject 26 Units into the skin daily. 04/27/15   [provider]  omeprazole  (PRILOSEC) 40 MG capsule Take 1 capsule (40 mg total) by mouth daily before breakfast. 02/27/20   [name] Josette RAMAN, PA-C  rosuvastatin  (CRESTOR ) 5 MG tablet Take 5 mg by mouth daily.    [provider]  sodium chloride  (OCEAN) 0.65 % SOLN nasal spray Place 1 spray into both nostrils as needed for congestion. 10/18/21   Milissa Tod PARAS, MD    Physical Exam: Vitals:   04/16/24 1008 04/16/24 1015 04/16/24 1016 04/16/24 1024  BP:  117/76    Pulse:  81 84   Resp:  (!) 22    Temp:    97.9 F (36.6 C)  TempSrc:    Oral  SpO2:  91% 92%   Weight: 112 kg     Height: 6' 6 (1.981 m)       Constitutional: NAD, calm, uncomfortable, pleasant and cooperative Eyes: PERRL, lids and conjunctivae normal ENMT: Mucous membranes are moist. Posterior pharynx clear of any exudate or lesions.  Neck: normal, supple, no masses, no thyromegaly Respiratory: clear to auscultation bilaterally, no wheezing, no crackles. Normal respiratory effort. No accessory muscle use.  Cardiovascular: normal s1, s2 sounds, no murmurs / rubs / gallops. 2+ pitting lower extremity edema. 2+ pedal pulses. No carotid bruits.  Abdomen: no tenderness, no masses palpated. No hepatosplenomegaly. Bowel sounds positive.  Musculoskeletal: 2+ pitting edema BLEs.  no clubbing / cyanosis. No joint deformity upper and lower extremities. Good ROM, no contractures. Normal muscle tone.  Skin:  no rashes, lesions, ulcers. No induration Neurologic: CN 2-12 grossly intact. Sensation intact, DTR normal. Strength 5/5 in all 4.  Psychiatric: Normal judgment and insight. Alert and oriented x 3. Normal mood.   Labs on Admission: I have personally reviewed following labs and imaging studies  CBC: Recent Labs  Lab 04/16/24 1029  WBC 10.8*  NEUTROABS 8.4*  HGB 13.1  HCT 41.2  MCV 92.6  PLT 206   Basic Metabolic Panel: Recent Labs  Lab 04/16/24 1029  NA 136  K 3.4*  CL 99  CO2 26  GLUCOSE 130*  BUN 36*  CREATININE 2.75*  CALCIUM  8.2*   GFR: Estimated Creatinine Clearance: 26.2 mL/min (A) (by C-G formula based on SCr of 2.75 mg/dL (H)). Liver Function Tests: Recent Labs  Lab 04/16/24 1029  AST 25  ALT 20  ALKPHOS 75  BILITOT 1.2  PROT 6.1*  ALBUMIN  3.2*   No results for input(s): LIPASE, AMYLASE in the last 168 hours. No results for input(s): AMMONIA in the last 168 hours. Coagulation Profile: No results for input(s): INR, PROTIME in the last 168 hours. Cardiac Enzymes: No results for input(s): CKTOTAL, CKMB, CKMBINDEX, TROPONINI in the last 168 hours. BNP (last 3 results) No results for input(s): PROBNP in the last 8760 hours. HbA1C: No results for input(s): HGBA1C in the last 72 hours. CBG: Recent Labs  Lab 04/16/24 1009 04/16/24 1248  GLUCAP 121* 95   Lipid Profile: No results for input(s): CHOL, HDL, LDLCALC, TRIG, CHOLHDL, LDLDIRECT in the last 72 hours. Thyroid Function Tests: No results for input(s): TSH, T4TOTAL, FREET4, T3FREE, THYROIDAB in the last 72 hours. Anemia Panel: No results for input(s): VITAMINB12, FOLATE, FERRITIN, TIBC, IRON, RETICCTPCT in the last 72 hours. Urine analysis:    Component Value Date/Time   COLORURINE YELLOW 04/16/2024 1104   APPEARANCEUR CLEAR 04/16/2024 1104   LABSPEC 1.014 04/16/2024 1104   PHURINE 5.0 04/16/2024 1104   GLUCOSEU >=500 (A) 04/16/2024  1104   HGBUR NEGATIVE 04/16/2024 1104   BILIRUBINUR NEGATIVE 04/16/2024 1104   KETONESUR NEGATIVE 04/16/2024 1104   PROTEINUR NEGATIVE 04/16/2024 1104   UROBILINOGEN 0.2 02/04/2014 0939   NITRITE NEGATIVE 04/16/2024 1104   LEUKOCYTESUR NEGATIVE 04/16/2024 1104    Radiological Exams on Admission: DG Chest Portable 1 View Result Date: 04/16/2024 CLINICAL DATA:  Weakness. EXAM: PORTABLE CHEST 1 VIEW COMPARISON:  March 29, 2024. FINDINGS: Stable cardiomegaly. Sternotomy wires are noted. Left-sided defibrillator is unchanged. Elevated right hemidiaphragm is noted. Left lung base is not completely included in  field-of-view. Visualized lung parenchyma is unremarkable. Bony thorax is unremarkable. IMPRESSION: Left lung base is not completely included in field-of-view. No definite abnormality seen in the visualized lung parenchyma. Electronically Signed   By: Lynwood Landy Raddle M.D.   On: 04/16/2024 12:06    EKG: Independently reviewed.    Assessment/Plan Principal Problem:   Acute HFrEF (heart failure with reduced ejection fraction) (HCC) Active Problems:   S/P mitral valve repair   S/P CABG x 3   Long term (current) use of anticoagulants   Ischemic cardiomyopathy   HTN (hypertension)   S/P MVR (mitral valve repair)   Automatic implantable cardioverter-defibrillator in situ   Hyperlipidemia   GERD (gastroesophageal reflux disease)   Gonadotropin deficiency (HCC)   Atrial fibrillation (HCC)   Ventricular arrhythmia   Type 2 diabetes mellitus without complication (HCC)   History of implantable cardioverter-defibrillator (ICD) placement   Atrial flutter (HCC)   Chronic kidney disease (CKD), stage IV (severe) (HCC)   Abnormal weight gain   Hypokalemia   Acute HFrEF -- failure of outpatient management with oral furosemide  likely due to stage IV CKD -- will try to diurese him with IV lasix  and see if he has an appropriate response  -- he likely will need to try torsemide if he goes back on  oral diuretics -- monitor intake and output closely with daily weights -- he is about 13 # over his dry weight from last hospitalization  -- monitor ReDs vest reading -- monitor electrolytes -- strict I/O and strict low sodium diet -- TED hoses  elevate legs  -- further recommendations to follow pending clinical course  Hypokalemia -- agree with oral potassium replacement KCl 40 meq BID -- check Mg and replete as needed -- monitoring daily BMP   Atrial Fib/Atrial Flutter -- pt had recent cardioversion done by EP [facility] on 04/01/24  -- he was recently taken off amiodarone  at cardiology [facility]  -- he is fully anticoagulated on apixaban    Type 2 DM with vascular complications -- agree with SSI coverage (renal) and frequent CBG monitoring  Ischemic cardiomyopathy CAD - resume home rosuvastatin   - ACS ruled out  - HS troponin is reassuring  Stage IV CKD  -- this is complicating his home diuresis -- IV furosemide  for now -- If diuresis is poor we may need to consult nephrologist to assist -- daily renal function panel testing   Generalized Weakness Adult Failure to thrive -- pt reports progressive weakness -- he likely is suffering from progressive heart failure -- if no improvement with diuresis, will likely need palliative medicine consultation    DVT prophylaxis: apixaban    Code Status: Full   Family Communication:   Disposition Plan: TBD   Consults called:   Admission status: INP Time spent: 61 mins   Level of care: Telemetry Afton Louder MD [facility] How to contact the [facility] Attending or Consulting provider 7A - 7P or covering provider during after hours 7P -7A, for this patient?  Check the care team in [facility] and look for a) attending/consulting [facility] provider listed and b) the [facility] team listed Log into [email or web address] and use [place]'s universal password to access. If you do not have the password, please contact the hospital operator. Locate the [facility] provider  you are looking for under [facility] and page to a number that you can be directly reached. If you still have difficulty reaching the provider, please page the [facility] (Director on Call) for the Hospitalists listed on  amion for assistance.   If 7PM-7AM, please contact night-coverage [email or web address] Password [email or web address]  04/16/2024, 1:18 PM

## 2024-04-16 NOTE — Hospital Course (Addendum)
 89 year old male with HFrEF, CAD status post CABG and mitral valve repair 2013, LVEF 20-25%, status post ICD, pacer placement, stage IV CKD, persistent atrial fibrillation/atrial flutter on chronic anticoagulation with apixaban  status post DCCV 84/25, ype 2 diabetes mellitus on insulin , with renal complications, hyperlipidemia, hypertension, coronary artery disease, status post mitral valve repair, recent hospitalizations, recently discharged 04/01/2024 with a discharge weight of 234 pounds at that time.  EP had performed a cardioversion on 8/4 for symptomatic atrial flutter.  The patient was admitted to the hospital from 03/29/2024 to 04/01/2024 for acute on chronic HFrEF.   He was discharged home on furosemide  40 mg p.o. daily.  Since hospital discharge, his weight has steadily climbed despite taking oral diuretics at home.  He is now up to 247 pounds.  He is increasingly becoming weaker.  He saw cardiology in the outpatient setting recently and was taken off amiodarone  04/05/24 due to patient report of arm and leg weakness.  He was brought into the emergency department because he continued to have swelling and weight gain in his legs and worsening shortness of breath.  He complained of severe fatigue and weakness and was unable to get out of bed this morning because he was so weak.  He was advised by the cardiology office to call EMS and come to the ED.  He was noted to be severely deconditioned and frail with significant peripheral edema, elevated BNP of 681, troponin 25, potassium 3.4, creatinine 2.75 and request was made for inpatient hospitalization for IV diuresis as he is failing outpatient diuresis with oral medication in part due to advanced renal failure.  The patient is show some initial clinical improvement.  Unfortunately, he continued to remain severely deconditioned.  Although his I's and O's were incomplete, he did show ample diuresis.  Unfortunately, he developed acute on chronic renal failure in part  due to cardiorenal syndrome.  He developed new oxygen  requirement on 2 L.  He was somewhat somnolent throughout the hospitalization but aroused and follow commands.  On 04/22/2024, the patient was more somnolent.  Further workup was initiated.  His diuretics were placed on hold secondary to uptrend of his serum creatinine.  Palliative medicine was consulted to initiate goals of care discussions.

## 2024-04-16 NOTE — Plan of Care (Signed)
  Problem: Skin Integrity: Goal: Risk for impaired skin integrity will decrease Outcome: Progressing   Problem: Activity: Goal: Risk for activity intolerance will decrease Outcome: Progressing

## 2024-04-16 NOTE — ED Triage Notes (Signed)
 Pt bib [facility] for persistent weakness since pts last hospital admission. Pts CBG 69 W/ EMS. Pt given 30G oral glucose. Cbg 98 now.

## 2024-04-16 NOTE — ED Provider Notes (Signed)
 [place] EMERGENCY DEPARTMENT AT [facility] Provider Note   CSN: [id number] Arrival date & time: 04/16/24  0957     History  Chief Complaint  Patient presents with   Weakness    Wayne Gordon is a 89 y.o. male with hypertension, stage III chronic kidney disease, diabetes, ischemic cardiomyopathy, atrial fibrillation on Eliquis , heart failure with reduced EF of 25%, ICD/pacer present  who presents bib [facility] for persistent weakness since pts last hospital admission. Pts CBG 69 W/ EMS. Pt given 30G oral glucose. Cbg 98 now.   Per chart review patient was recently admitted from 03/29/2024 to 04/01/2024 for weakness that has been progressive since readjustment to his ICD.  Found to have a flutter and cardioversion performed on 04/01/2024.  Was started on oral amiodarone  and diuresed with IV Lasix .  Today patient reports generalized weakness all over.  He endorses some shortness of breath and swelling as well.  Endorses cough productive of yellow phlegm.  Denies any fever/chills, chest pain, abdominal pain, asymmetric lower extremity IMA, falls/head trauma, sore throat.  Patient states he thinks he has a chest cold. States that he was diuresed while in the hospital recently and his discharge weight was 234 pounds.  States he weighed yesterday and was 247 pounds.  Feels like he is extra fluid on.  Also reports decreased p.o. intake and not urinating much even though he is still taking his Lasix .  Past Medical History:  Diagnosis Date   Allergy to ACE inhibitors    Angioedema many years ago; patient has tolerated Losartan  (ARB) in the past - it was held during admisson for GI bleed and worsening renal function >> resume Losartan  25 mg QD 06/2015   Arthritis    Atrial fibrillation (HCC)    PAF, CHADs2Vasc = 5   Carotid artery disease (HCC)    60-79% LICA   CHF (congestive heart failure) (HCC)    CKD (chronic kidney disease)    Coronary artery disease    Diabetes mellitus    GERD  (gastroesophageal reflux disease)    Hyperlipidemia    Hypertension    ICD (implantable cardiac defibrillator) in place 03/28/2012   Biotronik, Dr. Waddell 03/28/12   Ischemic cardiomyopathy    S/P CABG x 3; EF 25%   Mitral regurgitation    S/P mitral valve repair 2013   Myocardial infarction [facility])    Renal insufficiency        Home Medications Prior to Admission medications   Medication Sig Start Date End Date Taking? Authorizing Provider  acetaminophen  (TYLENOL ) 650 MG CR tablet Take 1,300 mg by mouth every 8 (eight) hours as needed for pain.   Yes [provider]  carvedilol  (COREG ) 12.5 MG tablet Take 1 tablet (12.5 mg total) by mouth 2 (two) times daily with a meal. 10/18/21  Yes Milissa Tod PARAS, MD  dapagliflozin  propanediol (FARXIGA ) 10 MG TABS tablet Take 1 tablet (10 mg total) by mouth daily. 07/18/23  Yes Waddell Danelle ORN, MD  diphenhydramine -acetaminophen  (TYLENOL  PM) 25-500 MG TABS tablet Take 1 tablet by mouth at bedtime.   Yes [provider]  ELIQUIS  2.5 MG TABS tablet TAKE (1) TABLET TWICE A DAY. Patient taking differently: Take 2.5 mg by mouth 2 (two) times daily. 02/19/24  Yes Waddell Danelle ORN, MD  furosemide  (LASIX ) 40 MG tablet Take 40 mg by mouth daily. 03/09/20  Yes [provider]  gabapentin  (NEURONTIN ) 100 MG capsule Take 200-300 mg by mouth at bedtime. Takes an  extra capsule if struggling to sleep.   Yes [provider]  HUMALOG  KWIKPEN 100 UNIT/ML KwikPen Inject 4-10 Units into the skin 2 (two) times daily. 4 units lunch, 10 units dinner   Yes [provider]  Insulin  Glargine (TOUJEO  SOLOSTAR) 300 UNIT/ML SOPN Inject 26 Units into the skin daily. 04/27/15  Yes [provider]  omeprazole  (PRILOSEC) 40 MG capsule Take 1 capsule (40 mg total) by mouth daily before breakfast. 02/27/20  Yes [name] Josette RAMAN, PA-C  rosuvastatin  (CRESTOR ) 5 MG tablet Take 5 mg by mouth daily.   Yes [provider]  amiodarone   (PACERONE ) 200 MG tablet Take 200 mg by mouth See admin instructions. Take 2 tablets twice daily for 5 days and then 1 tablet twice daily for 7 days and then 1 tablet daily after.    [provider]      Allergies    Ace inhibitors and Penicillins    Review of Systems   Review of Systems A 10 point review of systems was performed and is negative unless otherwise reported in HPI.  Physical Exam Updated Vital Signs BP 130/75   Pulse 72   Temp (!) 97.5 F (36.4 C) (Oral)   Resp 20   Ht 6' 6 (1.981 m)   Wt 112 kg   SpO2 95%   BMI 28.53 kg/m  Physical Exam General: Chronically ill appearing elderly male, lying in bed.  HEENT: PERRLA, Sclera anicteric, MMM, trachea midline.  Cardiology: RRR, no murmurs/rubs/gallops. BL radial and DP pulses equal bilaterally.  Resp: Normal respiratory rate and effort. CTAB, no wheezes, rhonchi, crackles.  Abd: Soft, non-tender, non-distended. No rebound tenderness or guarding.  GU: Deferred. MSK: 2+ dependent pitting symmetric bilateral lower extremity edema, no signs of trauma. Extremities without deformity or TTP. No cyanosis or clubbing. Skin: warm, dry.  Neuro: A&Ox4, CNs II-XII grossly intact. MAEs. Sensation grossly intact.  Psych: Normal mood and affect.   ED Results / Procedures / Treatments   Labs (all labs ordered are listed, but only abnormal results are displayed) Labs Reviewed  CBC WITH DIFFERENTIAL/PLATELET - Abnormal; Notable for the following components:      Result Value   WBC 10.8 (*)    Neutro Abs 8.4 (*)    All other components within normal limits  COMPREHENSIVE METABOLIC PANEL WITH GFR - Abnormal; Notable for the following components:   Potassium 3.4 (*)    Glucose, Bld 130 (*)    BUN 36 (*)    Creatinine, Ser 2.75 (*)    Calcium  8.2 (*)    Total Protein 6.1 (*)    Albumin  3.2 (*)    GFR, Estimated 22 (*)    All other components within normal limits  URINALYSIS, ROUTINE W REFLEX MICROSCOPIC - Abnormal;  Notable for the following components:   Glucose, UA >=500 (*)    All other components within normal limits  BRAIN NATRIURETIC PEPTIDE - Abnormal; Notable for the following components:   B Natriuretic Peptide 681.0 (*)    All other components within normal limits  CBG MONITORING, ED - Abnormal; Notable for the following components:   Glucose-Capillary 121 (*)    All other components within normal limits  TROPONIN I (HIGH SENSITIVITY) - Abnormal; Notable for the following components:   Troponin I (High Sensitivity) 25 (*)    All other components within normal limits  TROPONIN I (HIGH SENSITIVITY) - Abnormal; Notable for the following components:   Troponin I (High Sensitivity) 25 (*)  All other components within normal limits  RESP PANEL BY RT-PCR (RSV, FLU A&B, COVID)  [id number]  CBG MONITORING, ED    EKG EKG Interpretation Date/Time:  Tuesday April 16 2024 10:19:20 EDT Ventricular Rate:  74 PR Interval:    QRS Duration:  181 QT Interval:  462 QTC Calculation: 513 R Axis:   -60  Text Interpretation: V paced rhythm Similar to prior Confirmed by Franklyn Gills [phone]) on 04/16/2024 10:50:27 AM  Radiology DG Chest Portable 1 View Result Date: 04/16/2024 CLINICAL DATA:  Weakness. EXAM: PORTABLE CHEST 1 VIEW COMPARISON:  March 29, 2024. FINDINGS: Stable cardiomegaly. Sternotomy wires are noted. Left-sided defibrillator is unchanged. Elevated right hemidiaphragm is noted. Left lung base is not completely included in field-of-view. Visualized lung parenchyma is unremarkable. Bony thorax is unremarkable. IMPRESSION: Left lung base is not completely included in field-of-view. No definite abnormality seen in the visualized lung parenchyma. Electronically Signed   By: Lynwood Landy Raddle M.D.   On: 04/16/2024 12:06    Procedures Procedures    Medications Ordered in ED Medications  ondansetron  (ZOFRAN ) injection 4 mg (has no administration in time range)  furosemide  (LASIX ) injection 40 mg  (has no administration in time range)  potassium chloride  SA (KLOR-CON  M) CR tablet 40 mEq (has no administration in time range)  furosemide  (LASIX ) injection 40 mg (40 mg Intravenous Given 04/16/24 1400)    ED Course/ Medical Decision Making/ A&P                          Medical Decision Making Amount and/or Complexity of Data Reviewed Labs: ordered. Decision-making details documented in ED Course. Radiology: ordered. Decision-making details documented in ED Course.  Risk Prescription drug management. Decision regarding hospitalization.    MDM:    Patient presents with generalized weakness, dyspnea, cough productive of yellow phlegm, and lower extremity edema with decreased p.o. intake.   this involves an extensive number of treatment options, and is a complaint that carries with it a high risk of complications and morbidity.  I considered the following differential and admission for this acute, potentially life threatening condition. Consider viral illness such as COVID flu RSV.  Consider pneumonia, pulmonary edema, heart failure exacerbation.  Patient's BNP elevated to 681 which is approximately the same as 2 weeks ago when he was admitted for IV diuresis.  His chest x-ray does not demonstrate any significant abnormalities and patient is not hypoxic however he is mildly tachypneic and his weight is up 13 pounds as of yesterday from his discharge weight per the patient.  He has no chest pain and EKG is unchanged, troponin mildly elevated but flat x 2, low concern for ACS.  No acute anemia or severe electrolyte derangements though he does have an AKI likely from decreased p.o. intake.  Additionally, likely from the decreased p.o. intake he was found to be hypoglycemic at home.  Repeat blood glucose here was 95, but will need monitoring for that as well.  Patient with severe generalized weakness that he was unable to get out of bed even with the help of his wife today, AKI, likely heart failure  exacerbation. Will give K and lasix  40 mg IV and admit to medicine.  Clinical Course as of 04/16/24 1520  Tue Apr 16, 2024  1050 Glucose-Capillary(!): 121 [HN]  1130 Creatinine(!): 2.75 +AKI on CKD, increased from ~1.9-2.1 baseline [HN]  1130 B Natriuretic Peptide(!): 681.0 Approximately the same as 2 weeks ago when admitted  for diuresis. [HN]  1131 WBC(!): 10.8 Very mild leukocytosis w/ left shift [HN]  1239 Resp panel by RT-PCR (RSV, Flu A&B, Covid) Anterior Nasal Swab neg [HN]  1239 DG Chest Portable 1 View Left lung base is not completely included in field-of-view. No definite abnormality seen in the visualized lung parenchyma.   [HN]  1251 Troponin I (High Sensitivity)(!): 25 Mildly elevated but flat [HN]    Clinical Course User Index [HN] Franklyn Sid SAILOR, MD    Labs: I Ordered, and personally interpreted labs.  The pertinent results include: Those listed above  Imaging Studies ordered: I ordered imaging studies including chest x-ray I independently visualized and interpreted imaging. I agree with the radiologist interpretation  Additional history obtained from chart review.   Cardiac Monitoring: The patient was maintained on a cardiac monitor.  I personally viewed and interpreted the cardiac monitored which showed an underlying rhythm of: Ventricular paced rhythm  Reevaluation: After the interventions noted above, I reevaluated the patient and found that they have :stayed the same  Social Determinants of Health:  lives independently  Disposition: Admit to medicine  Co morbidities that complicate the patient evaluation  Past Medical History:  Diagnosis Date   Allergy to ACE inhibitors    Angioedema many years ago; patient has tolerated Losartan  (ARB) in the past - it was held during admisson for GI bleed and worsening renal function >> resume Losartan  25 mg QD 06/2015   Arthritis    Atrial fibrillation (HCC)    PAF, CHADs2Vasc = 5   Carotid artery disease  (HCC)    60-79% LICA   CHF (congestive heart failure) (HCC)    CKD (chronic kidney disease)    Coronary artery disease    Diabetes mellitus    GERD (gastroesophageal reflux disease)    Hyperlipidemia    Hypertension    ICD (implantable cardiac defibrillator) in place 03/28/2012   Biotronik, Dr. Waddell 03/28/12   Ischemic cardiomyopathy    S/P CABG x 3; EF 25%   Mitral regurgitation    S/P mitral valve repair 2013   Myocardial infarction [facility])    Renal insufficiency      Medicines Meds ordered this encounter  Medications   furosemide  (LASIX ) injection 40 mg   sodium chloride  flush (NS) 0.9 % injection 3 mL   sodium chloride  flush (NS) 0.9 % injection 3 mL   0.9 %  sodium chloride  infusion   acetaminophen  (TYLENOL ) tablet 650 mg   ondansetron  (ZOFRAN ) injection 4 mg   furosemide  (LASIX ) injection 40 mg   potassium chloride  SA (KLOR-CON  M) CR tablet 40 mEq   insulin  aspart (novoLOG ) injection 0-9 Units    Correction coverage::   Sensitive (thin, NPO, renal)    CBG < 70::   Implement Hypoglycemia Standing Orders and refer to Hypoglycemia Standing Orders sidebar report    CBG 70 - 120::   0 units    CBG 121 - 150::   1 unit    CBG 151 - 200::   2 units    CBG 201 - 250::   3 units    CBG 251 - 300::   5 units    CBG 301 - 350::   7 units    CBG 351 - 400:   9 units    CBG > 400:   call MD and obtain STAT lab verification   insulin  aspart (novoLOG ) injection 3 Units   carvedilol  (COREG ) tablet 12.5 mg   dapagliflozin  propanediol (FARXIGA ) tablet 10  mg   DISCONTD: diphenhydramine -acetaminophen  (TYLENOL  PM) 25-500 MG per tablet 1 tablet   apixaban  (ELIQUIS ) tablet 2.5 mg   gabapentin  (NEURONTIN ) capsule 100 mg    Takes an extra capsule if struggling to sleep.     insulin  glargine-yfgn (SEMGLEE ) injection 10 Units   pantoprazole  (PROTONIX ) EC tablet 40 mg   rosuvastatin  (CRESTOR ) tablet 5 mg    I have reviewed the patients home medicines and have made adjustments as  needed  Problem List / ED Course: Problem List Items Addressed This Visit       Cardiovascular and Mediastinum   CHF exacerbation (HCC) - Primary   Relevant Medications   furosemide  (LASIX ) injection 40 mg (Start on 04/16/2024 10:00 PM)   amiodarone  (PACERONE ) 200 MG tablet   carvedilol  (COREG ) tablet 12.5 mg (Start on 04/16/2024  5:00 PM)   apixaban  (ELIQUIS ) tablet 2.5 mg (Start on 04/16/2024  8:00 PM)   rosuvastatin  (CRESTOR ) tablet 5 mg (Start on 04/16/2024  6:00 PM)   Other Visit Diagnoses       Generalized weakness         Dyspnea, unspecified type         Hypoglycemia                       This note was created using dictation software, which may contain spelling or grammatical errors.    Franklyn Sid SAILOR, MD 04/16/24 [phone]

## 2024-04-16 NOTE — Telephone Encounter (Signed)
 Spoke with patient & wife.  States he continues to have swelling but can't get up to weigh this morning.  He complains of weakness, fatigue & feels like his limbs are just flopping around.  States that he can't even get out of bed this morning.  Advised patient & wife needs evaluation in ED - can call EMS to help transport if she has no help to get him there.  Wife verbalized understanding.

## 2024-04-17 DIAGNOSIS — I5023 Acute on chronic systolic (congestive) heart failure: Secondary | ICD-10-CM | POA: Insufficient documentation

## 2024-04-17 DIAGNOSIS — Z9889 Other specified postprocedural states: Secondary | ICD-10-CM | POA: Diagnosis not present

## 2024-04-17 DIAGNOSIS — I25810 Atherosclerosis of coronary artery bypass graft(s) without angina pectoris: Secondary | ICD-10-CM

## 2024-04-17 DIAGNOSIS — Z95810 Presence of automatic (implantable) cardiac defibrillator: Secondary | ICD-10-CM | POA: Diagnosis not present

## 2024-04-17 DIAGNOSIS — I10 Essential (primary) hypertension: Secondary | ICD-10-CM

## 2024-04-17 DIAGNOSIS — I5043 Acute on chronic combined systolic (congestive) and diastolic (congestive) heart failure: Secondary | ICD-10-CM | POA: Diagnosis not present

## 2024-04-17 DIAGNOSIS — N179 Acute kidney failure, unspecified: Secondary | ICD-10-CM | POA: Insufficient documentation

## 2024-04-17 DIAGNOSIS — I4892 Unspecified atrial flutter: Secondary | ICD-10-CM | POA: Diagnosis not present

## 2024-04-17 DIAGNOSIS — N178 Other acute kidney failure: Secondary | ICD-10-CM

## 2024-04-17 DIAGNOSIS — I4819 Other persistent atrial fibrillation: Secondary | ICD-10-CM

## 2024-04-17 LAB — CBC WITH DIFFERENTIAL/PLATELET
Abs Immature Granulocytes: 0.05 K/uL (ref 0.00–0.07)
Basophils Absolute: 0.1 K/uL (ref 0.0–0.1)
Basophils Relative: 0 %
Eosinophils Absolute: 0 K/uL (ref 0.0–0.5)
Eosinophils Relative: 0 %
HCT: 42.4 % (ref 39.0–52.0)
Hemoglobin: 13.5 g/dL (ref 13.0–17.0)
Immature Granulocytes: 0 %
Lymphocytes Relative: 14 %
Lymphs Abs: 1.8 K/uL (ref 0.7–4.0)
MCH: 29.3 pg (ref 26.0–34.0)
MCHC: 31.8 g/dL (ref 30.0–36.0)
MCV: 92.2 fL (ref 80.0–100.0)
Monocytes Absolute: 1.3 K/uL — ABNORMAL HIGH (ref 0.1–1.0)
Monocytes Relative: 10 %
Neutro Abs: 9.5 K/uL — ABNORMAL HIGH (ref 1.7–7.7)
Neutrophils Relative %: 76 %
Platelets: 203 K/uL (ref 150–400)
RBC: 4.6 MIL/uL (ref 4.22–5.81)
RDW: 15.3 % (ref 11.5–15.5)
WBC: 12.6 K/uL — ABNORMAL HIGH (ref 4.0–10.5)
nRBC: 0.2 % (ref 0.0–0.2)

## 2024-04-17 LAB — BASIC METABOLIC PANEL WITH GFR
Anion gap: 13 (ref 5–15)
BUN: 37 mg/dL — ABNORMAL HIGH (ref 8–23)
CO2: 24 mmol/L (ref 22–32)
Calcium: 8.4 mg/dL — ABNORMAL LOW (ref 8.9–10.3)
Chloride: 102 mmol/L (ref 98–111)
Creatinine, Ser: 2.6 mg/dL — ABNORMAL HIGH (ref 0.61–1.24)
GFR, Estimated: 23 mL/min — ABNORMAL LOW (ref >60)
Glucose, Bld: 74 mg/dL (ref 70–99)
Potassium: 3.8 mmol/L (ref 3.5–5.1)
Sodium: 139 mmol/L (ref 135–145)

## 2024-04-17 LAB — MAGNESIUM: Magnesium: 2.4 mg/dL (ref 1.7–2.4)

## 2024-04-17 LAB — GLUCOSE, CAPILLARY
Glucose-Capillary: 119 mg/dL — ABNORMAL HIGH (ref 70–99)
Glucose-Capillary: 80 mg/dL (ref 70–99)
Glucose-Capillary: 57 mg/dL — ABNORMAL LOW (ref 70–99)
Glucose-Capillary: 65 mg/dL — ABNORMAL LOW (ref 70–99)
Glucose-Capillary: 80 mg/dL (ref 70–99)
Glucose-Capillary: 197 mg/dL — ABNORMAL HIGH (ref 70–99)
Glucose-Capillary: 258 mg/dL — ABNORMAL HIGH (ref 70–99)
Glucose-Capillary: 234 mg/dL — ABNORMAL HIGH (ref 70–99)

## 2024-04-17 LAB — BRAIN NATRIURETIC PEPTIDE: B Natriuretic Peptide: 927 pg/mL — ABNORMAL HIGH (ref 0.0–100.0)

## 2024-04-17 MED ORDER — FUROSEMIDE 10 MG/ML IJ SOLN
40.0000 mg | Freq: Once | INTRAMUSCULAR | Status: AC
  Administered 2024-04-17: 40 mg via INTRAVENOUS
  Filled 2024-04-17: qty 4

## 2024-04-17 MED ORDER — GLUCOSE 40 % PO GEL
1.0000 | ORAL | Status: AC
  Administered 2024-04-17: 31 g via ORAL
  Filled 2024-04-17: qty 1.21

## 2024-04-17 MED ORDER — FUROSEMIDE 10 MG/ML IJ SOLN
80.0000 mg | Freq: Two times a day (BID) | INTRAMUSCULAR | Status: DC
  Administered 2024-04-17 – 2024-04-18 (×2): 80 mg via INTRAVENOUS
  Filled 2024-04-17 (×2): qty 8

## 2024-04-17 MED ORDER — INSULIN GLARGINE-YFGN 100 UNIT/ML ~~LOC~~ SOLN
8.0000 [IU] | Freq: Every day | SUBCUTANEOUS | Status: DC
  Administered 2024-04-17 – 2024-04-24 (×8): 8 [IU] via SUBCUTANEOUS
  Filled 2024-04-17 (×9): qty 0.08

## 2024-04-17 NOTE — Evaluation (Signed)
 Physical Therapy Evaluation Patient Details Name: Wayne Gordon MRN: [id number] DOB: 1934/08/13 Today's Date: 04/17/2024  History of Present Illness  Wayne Gordon is a 89 year old male with HFrEF, ischemic cardiomyopathy, LVEF 20-25%, status post ICD, pacer placement, stage IV CKD, atrial fibrillation/atrial flutter on chronic anticoagulation with apixaban , vertigo, osteoarthritis, type 2 diabetes mellitus on insulin , with renal complications, hyperlipidemia, hypertension, coronary artery disease, status post mitral valve repair, recent hospitalizations, recently discharged 04/01/2024 with a discharge weight of 234 pounds at that time.  EP had performed a cardioversion on 8/4 for symptomatic atrial flutter.  Since hospital discharge, his weight has steadily climbed despite taking oral diuretics at home.  He is now up to 247 pounds.  He is increasingly becoming weaker.  He saw cardiology in the outpatient setting recently and was taken off amiodarone .  He was brought into the emergency department because he continued to have swelling and weight gain in his legs.  He complained of severe fatigue and weakness and was unable to get out of bed this morning because he was so weak.  He was advised by the cardiology office to call EMS and come to the ED.  He was noted to be severely deconditioned and frail with significant peripheral edema, elevated BNP of 681, troponin 25, potassium 3.4, creatinine 2.75 and request was made for inpatient hospitalization for IV diuresis as he is failing outpatient diuresis with oral medication likely due to advanced renal failure   Clinical Impression  Patient agreeable to OT/PT evaluation. On this date, patient required moderate assist with all bed mobility, functional transfers and short ambulation with RW. Patient is limited most due to general weakness and decreased endurance but also demonstrates dec core strength and impaired seated and standing balance. Patient reports he's  required more assist recently, regressing to needing RW in home. Reports he has support at home through wife and son. Unsure of amount of physical help that could be provided. Patient tolerates sitting up in recliner at end of session, with call button within reach. Patient will benefit from continued skilled physical therapy acutely and in recommended venue in order to address his current deficits and improve overall function.          If plan is discharge home, recommend the following: A little help with bathing/dressing/bathroom;Assistance with cooking/housework;Assist for transportation;Help with stairs or ramp for entrance;A lot of help with walking and/or transfers   Can travel by private vehicle        Equipment Recommendations None recommended by PT  Recommendations for Other Services       Functional Status Assessment Patient has had a recent decline in their functional status and demonstrates the ability to make significant improvements in function in a reasonable and predictable amount of time.     Precautions / Restrictions Precautions Precautions: Fall Recall of Precautions/Restrictions: Intact Restrictions Weight Bearing Restrictions Per Provider Order: No      Mobility  Bed Mobility Overal bed mobility: Needs Assistance Bed Mobility: Supine to Sit     Supine to sit: HOB elevated, Mod assist     General bed mobility comments: Assist with managing LE due to weakness. Inc time needed due to slow labored movement    Transfers Overall transfer level: Needs assistance Equipment used: Rolling walker (2 wheels) Transfers: Sit to/from Stand, Bed to chair/wheelchair/BSC Sit to Stand: Mod assist   Step pivot transfers: Mod assist       General transfer comment: Pt demo slow labored movement with transfers.  Assist for STS from bed due to LE weakness. verbal cueing for glute engagement for upright trunk with pt demo inc lean with fatigue.     Ambulation/Gait Ambulation/Gait assistance: Min assist, Mod assist Gait Distance (Feet): 3 Feet Assistive device: Rolling walker (2 wheels) Gait Pattern/deviations: Step-through pattern, Decreased step length - right, Decreased step length - left, Trunk flexed, Knee flexed in stance - right, Knee flexed in stance - left Gait velocity: dec     General Gait Details: Pt limited to very short forward/backward steps with RW at bedside due to fatigue. verbal cueing for glute engagement for upright trunk with pt demo inc lean with fatigue.  Stairs        Wheelchair Mobility     Tilt Bed    Modified Rankin (Stroke Patients Only)       Balance Overall balance assessment: Needs assistance Sitting-balance support: No upper extremity supported, Feet supported Sitting balance-Leahy Scale: Poor Sitting balance - Comments: fair/poor Seated EOB, pt loses balance to L while attempting to donn socks   Standing balance support: During functional activity, Reliant on assistive device for balance, Bilateral upper extremity supported Standing balance-Leahy Scale: Poor Standing balance comment: Poor/fair with RW         Pertinent Vitals/Pain Pain Assessment Pain Assessment: Faces Faces Pain Scale: Hurts little more Pain Location: Pt demo signs of pain with mobility. Unclear of location. Pain Intervention(s): Limited activity within patient's tolerance, Repositioned, Monitored during session    Home Living Family/patient expects to be discharged to:: Private residence Living Arrangements: Spouse/significant other;Children Available Help at Discharge: Family;Available 24 hours/day Type of Home: House Home Access: Ramped entrance     Home Layout: One level Home Equipment: Cane - single point;Shower seat;Grab bars - tub/shower;Rolling Walker (2 wheels) Additional Comments: Pt confirms home set up history in system.    Prior Function Prior Level of Function : Needs assist        Physical Assist : ADLs (physical)   ADLs (physical): IADLs Mobility Comments: Pt reports he's now using RW in home. Not much community ambulation. ADLs Comments: Reports independent with bathing/dressing. Son handles iADLs.     Extremity/Trunk Assessment   Upper Extremity Assessment Upper Extremity Assessment: Defer to OT evaluation    Lower Extremity Assessment Lower Extremity Assessment: Generalized weakness (No stark difference between R/L. Pt generally weak throughout, requiring assist for all functional mobility. MMT 3+ to 4- at best throughout.)    Cervical / Trunk Assessment Cervical / Trunk Assessment: Kyphotic  Communication   Communication Communication: No apparent difficulties    Cognition Arousal: Alert Behavior During Therapy: WFL for tasks assessed/performed   PT - Cognitive impairments: No apparent impairments       Following commands: Intact       Cueing Cueing Techniques: Verbal cues, Visual cues, Tactile cues     General Comments      Exercises     Assessment/Plan    PT Assessment Patient needs continued PT services;All further PT needs can be met in the next venue of care  PT Problem List Decreased strength;Decreased activity tolerance;Decreased balance;Decreased mobility;Decreased knowledge of use of DME;Decreased knowledge of precautions       PT Treatment Interventions DME instruction;Gait training;Functional mobility training;Therapeutic activities;Therapeutic exercise;Balance training;Patient/family education;Canalith reposition    PT Goals (Current goals can be found in the Care Plan section)  Acute Rehab PT Goals Patient Stated Goal: Return home PT Goal Formulation: With patient Time For Goal Achievement: 05/01/24 Potential to Achieve Goals: Good  Frequency Min 3X/week     Co-evaluation PT/OT/SLP Co-Evaluation/Treatment: Yes Reason for Co-Treatment: To address functional/ADL transfers PT goals addressed during session:  Mobility/safety with mobility         AM-PAC PT 6 Clicks Mobility  Outcome Measure Help needed turning from your back to your side while in a flat bed without using bedrails?: A Little Help needed moving from lying on your back to sitting on the side of a flat bed without using bedrails?: A Lot Help needed moving to and from a bed to a chair (including a wheelchair)?: A Lot Help needed standing up from a chair using your arms (e.g., wheelchair or bedside chair)?: A Lot Help needed to walk in hospital room?: A Lot Help needed climbing 3-5 steps with a railing? : A Lot 6 Click Score: 13    End of Session Equipment Utilized During Treatment: Gait belt Activity Tolerance: Patient tolerated treatment well;Patient limited by fatigue Patient left: in chair;with call bell/phone within reach;with chair alarm set   PT Visit Diagnosis: Unsteadiness on feet (R26.81);Other abnormalities of gait and mobility (R26.89);Muscle weakness (generalized) (M62.81);Difficulty in walking, not elsewhere classified (R26.2)    Time: 9156-9099 PT Time Calculation (min) (ACUTE ONLY): 17 min   Charges:   PT Evaluation $PT Eval Low Complexity: 1 Low   PT General Charges $$ ACUTE PT VISIT: 1 Visit         10:15 AM, 04/17/24 Rosaria Settler, PT, DPT [place] [facility]

## 2024-04-17 NOTE — Consult Note (Signed)
 Cardiology Consultation   Patient ID: Anothony Bursch Gordon MRN: [id number]; DOB: 02-09-1934  Admit date: 04/16/2024 Date of Consult: 04/17/2024  PCP:  Renato Dorothey HERO, NP   [facility] Providers Cardiologist:  Diannah SHAUNNA Maywood, MD      Patient Profile: Wayne Gordon is a 89 y.o. male with a hx of CAD s/p CABG x 3 with MVR 2013, ICM with baseline EF 20-25%, VT, sinus node dysfunction s/p ICD 2013 with upgrade of Biotronik BiV ICD 09/2020, persistent atrial fibrillation/flutter s/p successful DCCV 04/01/2024, HTN, HLD, DM II, stage III CKD, Carotid Artery disease,  vertigo who is being seen 04/17/2024 for the evaluation of acute HF at the request of Dr. Evonnie.  History of Present Illness: [name] was admitted 8/1-11/2023 for weakness that has progressed since recent adjustment of to ICD. Device interrogation showed atrial flutter and EP performed cardioversion on 8/4.  He was diuresed with IV Lasix  and discharged weight of 234 lbs.  He was started and discharged on oral amiodarone  400mg  BID x7d, then 200mg  BID x 7d, then 200mg  daily.  Also discharged on Eliquis  2.5 mg twice daily, carvedilol  12.5 mg twice daily, Farxiga  10 mg daily, Lasix  40 mg daily, and Crestor  5 mg daily.   Patient contacted office shortly after hospital discharge noted issues with amiodarone .  Reported muscles in his legs and arm are weak and no strength. Dr. Elnor discontinued amiodarone  on 04/05/2024.   Patient contacted office on 04/08/2024 noting more leg swelling and 6 pound weight gain.  Denied any SOB.  Noted taking Lasix  80 mg daily.  Scheduled for close follow-up.  Last seen in heart care OV 8/14/225 by Almarie Crate, NP for hospital follow-up.  Reported weight gain, right leg swelling, palpitations (hard beats 1-2 x/day).  Exam revealed abnormal breath sounds.  Noted recently increasing Lasix  to 40 mg daily but has not noticed a difference.  Follow-up labs showed BMP (Cr 2.38), proBNP 4081, mag WNL.  Recommended Lasix  increased to 40 mg twice daily X 3 days then return to daily with plans for repeat lab work and referral to nephrology, Dr. Rachele.   Patient called after-hours line on 04/13/2024 about weight gain.  Reported gaining 2 pounds each day for the last 3 days.  He weighed 246 pounds that morning (up from 234 pounds at recent discharge but weighed 248 at office visit 08/14).  Reported feeling a little better when he takes 80 mg altogether rather than 40 twice daily.  Recommended to continue that for the next 2 days and return for repeat labs.  Noted possibly considering torsemide as an alternative.  Presented to [facility] 8/29 for leg swelling and weight gain. K3.4 now 3.8, CR 2.75 now 2.6, Mg 2.4, albumin  3.2, WBC 10.8 > 12.6 with neutrophils 8.4 > 9.5, negative respiratory panel, negative UA except + gluc,  BNP 681 > 927, TN 25 > 25 EKG showed V paced, HR 74 with no acute ischemic changes CXR with no acute findings Treated with Lasix  40 mg IV. I/O -470.   On interview, patient reports weight gain and right leg swelling as above since shortly after recent hospitalization. He confirms that he was taking Lasix  80 mg daily without any significant change.  Notes improvement in edema and good urine output since IV diuresis. He sleeps with his bed elevated.  Also reports developing a cold after recent hospitalization associated with dry cough. Noted poor control of limbs since last hospitalization after starting Amio but has not  changed since d/c. He is not very active but is able to walk room to room without any concerns. Denies any SOB, CP, palpitations, dizziness, syncope.  Reports low-sodium diet.  Daily fluid intake includes 1 cup of decaf coffee and 2 bing vitamin waters.  Denies any tobacco use/EtOH/drugs.  He stays at home with wife and son.  He is able to care for himself.  Reports medication compliance.  Past Medical History:  Diagnosis Date   Allergy to ACE inhibitors    Angioedema many  years ago; patient has tolerated Losartan  (ARB) in the past - it was held during admisson for GI bleed and worsening renal function >> resume Losartan  25 mg QD 06/2015   Arthritis    Atrial fibrillation (HCC)    PAF, CHADs2Vasc = 5   Carotid artery disease (HCC)    60-79% LICA   CHF (congestive heart failure) (HCC)    CKD (chronic kidney disease)    Coronary artery disease    Diabetes mellitus    GERD (gastroesophageal reflux disease)    Hyperlipidemia    Hypertension    ICD (implantable cardiac defibrillator) in place 03/28/2012   Biotronik, Dr. Waddell 03/28/12   Ischemic cardiomyopathy    S/P CABG x 3; EF 25%   Mitral regurgitation    S/P mitral valve repair 2013   Myocardial infarction [facility])    Renal insufficiency     Past Surgical History:  Procedure Laterality Date   BIV UPGRADE N/A 10/12/2020   Procedure: BIV ICD UPGRADE;  Surgeon: Waddell Danelle ORN, MD;  Location: [facility] INVASIVE CV LAB;  Service: Cardiovascular;  Laterality: N/A;   CARDIOVERSION N/A 04/01/2024   Procedure: CARDIOVERSION;  Surgeon: Lonni Slain, MD;  Location: [facility] INVASIVE CV LAB;  Service: Cardiovascular;  Laterality: N/A;   CHOLECYSTECTOMY N/A 09/20/2017   Procedure: LAPAROSCOPIC SUBTOTAL CHOLECYSTECTOMY;  Surgeon: Rubin Calamity, MD;  Location: [facility] OR;  Service: General;  Laterality: N/A;   COLONOSCOPY N/A 04/24/2014   RMR Pancolonic diverticulosis   CORONARY ARTERY BYPASS GRAFT  10/17/2011   Procedure: CORONARY ARTERY BYPASS GRAFTING (CABG);  Surgeon: Sudie VEAR Laine, MD;  Location: [facility] OR;  Service: Open Heart Surgery;  Laterality: N/A;   ERCP N/A 09/29/2017   Procedure: ENDOSCOPIC RETROGRADE CHOLANGIOPANCREATOGRAPHY (ERCP);  Surgeon: Rollin Dover, MD;  Location: [facility] ENDOSCOPY;  Service: Endoscopy;  Laterality: N/A;   ESOPHAGOGASTRODUODENOSCOPY N/A 04/24/2014   RMR Subtle nodularity the gastric mucosa of uncertain significance-status post biopsy. Hiatal hernia. chronic inflammation, no H.pylori    ESOPHAGOGASTRODUODENOSCOPY N/A 06/24/2016   Dr. Shaaron: normal esophagus, small hiatal hernia, normal duodenum   ICD  03/28/2012   IMPLANTABLE CARDIOVERTER DEFIBRILLATOR IMPLANT N/A 03/28/2012   Procedure: IMPLANTABLE CARDIOVERTER DEFIBRILLATOR IMPLANT;  Surgeon: Danelle ORN Waddell, MD;  Location: [facility] CATH LAB;  Service: Cardiovascular;  Laterality: N/A;   KNEE ARTHROSCOPY  ~ 2008   right   LEFT AND RIGHT HEART CATHETERIZATION WITH CORONARY ANGIOGRAM N/A 10/12/2011   Procedure: LEFT AND RIGHT HEART CATHETERIZATION WITH CORONARY ANGIOGRAM;  Surgeon: Deatrice DELENA Cage, MD;  Location: [facility] CATH LAB;  Service: Cardiovascular;  Laterality: N/A;   MITRAL VALVE REPAIR  10/17/2011   Procedure: MITRAL VALVE REPAIR (MVR);  Surgeon: Sudie VEAR Laine, MD;  Location: [facility] OR;  Service: Open Heart Surgery;  Laterality: N/A;   NASAL SINUS SURGERY  1990's   right     Home Medications:  Prior to Admission medications   Medication Sig Start Date End Date Taking? Authorizing Provider  acetaminophen  (TYLENOL ) 650 MG CR  tablet Take 1,300 mg by mouth every 8 (eight) hours as needed for pain.   Yes [provider]  carvedilol  (COREG ) 12.5 MG tablet Take 1 tablet (12.5 mg total) by mouth 2 (two) times daily with a meal. 10/18/21  Yes Milissa Tod PARAS, MD  dapagliflozin  propanediol (FARXIGA ) 10 MG TABS tablet Take 1 tablet (10 mg total) by mouth daily. 07/18/23  Yes Waddell Danelle ORN, MD  diphenhydramine -acetaminophen  (TYLENOL  PM) 25-500 MG TABS tablet Take 1 tablet by mouth at bedtime.   Yes [provider]  ELIQUIS  2.5 MG TABS tablet TAKE (1) TABLET TWICE A DAY. Patient taking differently: Take 2.5 mg by mouth 2 (two) times daily. 02/19/24  Yes Waddell Danelle ORN, MD  furosemide  (LASIX ) 40 MG tablet Take 40 mg by mouth daily. 03/09/20  Yes [provider]  gabapentin  (NEURONTIN ) 100 MG capsule Take 200-300 mg by mouth at bedtime. Takes an extra capsule if struggling to sleep.   Yes [provider]   HUMALOG  KWIKPEN 100 UNIT/ML KwikPen Inject 4-10 Units into the skin 2 (two) times daily. 4 units lunch, 10 units dinner   Yes [provider]  Insulin  Glargine (TOUJEO  SOLOSTAR) 300 UNIT/ML SOPN Inject 26 Units into the skin daily. 04/27/15  Yes [provider]  omeprazole  (PRILOSEC) 40 MG capsule Take 1 capsule (40 mg total) by mouth daily before breakfast. 02/27/20  Yes [name] Josette RAMAN, PA-C  rosuvastatin  (CRESTOR ) 5 MG tablet Take 5 mg by mouth daily.   Yes [provider]  amiodarone  (PACERONE ) 200 MG tablet Take 200 mg by mouth See admin instructions. Take 2 tablets twice daily for 5 days and then 1 tablet twice daily for 7 days and then 1 tablet daily after.    [provider]    Scheduled Meds:  apixaban   2.5 mg Oral BID   carvedilol   12.5 mg Oral BID WC   dapagliflozin  propanediol  10 mg Oral QHS   furosemide   40 mg Intravenous Q12H   gabapentin   100 mg Oral QHS   insulin  aspart  0-9 Units Subcutaneous TID WC   insulin  aspart  3 Units Subcutaneous TID WC   insulin  glargine-yfgn  10 Units Subcutaneous QHS   pantoprazole   40 mg Oral QAC breakfast   potassium chloride   40 mEq Oral BID   rosuvastatin   5 mg Oral QPM   sodium chloride  flush  3 mL Intravenous Q12H   Continuous Infusions:  sodium chloride      PRN Meds: sodium chloride , acetaminophen , guaiFENesin , ondansetron  (ZOFRAN ) IV, sodium chloride  flush  Allergies:    Allergies  Allergen Reactions   Ace Inhibitors Swelling and Other (See Comments)    Kidney issues; angioedema per PMH in chart   Penicillins Palpitations    Social History:   Social History   Socioeconomic History   Marital status: Married    Spouse name: Not on file   Number of children: Not on file   Years of education: Not on file   Highest education level: Not on file  Occupational History   Occupation: Retired  Tobacco Use   Smoking status: Never   Smokeless tobacco: Never  Vaping Use   Vaping status:  Never Used  Substance and Sexual Activity   Alcohol use: No    Alcohol/week: 0.0 standard drinks of alcohol   Drug use: No   Sexual activity: Yes  Other Topics Concern   Not on file  Social History Narrative   Not on file  Family History:   Family History  Problem Relation Age of Onset   Heart disease Mother    Diabetes Father    Cardiomyopathy Father    Colon cancer Neg Hx      ROS:  Please see the history of present illness.  All other ROS reviewed and negative.     Physical Exam/Data: Vitals:   04/16/24 1809 04/16/24 2243 04/17/24 0300 04/17/24 0400  BP: 132/73 106/65 127/71   Pulse: 78 70 73   Resp: 20 18 18    Temp: 98.2 F (36.8 C) 100.1 F (37.8 C) 98.8 F (37.1 C)   TempSrc: Oral Oral Oral   SpO2: 93% 90% 90%   Weight:    111.1 kg  Height:        Intake/Output Summary (Last 24 hours) at 04/17/2024 0838 Last data filed at 04/17/2024 0406 Gross per 24 hour  Intake 480 ml  Output 950 ml  Net -470 ml      04/17/2024    4:00 AM 04/16/2024   10:08 AM 04/11/2024    9:56 AM  Last 3 Weights  Weight (lbs) 244 lb 14.9 oz 246 lb 14.6 oz 248 lb 12.8 oz  Weight (kg) 111.1 kg 112 kg 112.855 kg     Body mass index is 28.3 kg/m.  General:  Well nourished, well developed, in no acute distress HEENT: normal Neck: pulsating JVD Vascular: No carotid bruits; Distal pulses 2+ bilaterally Cardiac:  normal S1, S2; RRR; no murmur  Lungs:  diminished breath sounds in lung bases Abd: soft, nontender, no hepatomegaly  Ext: 1+ edema  Musculoskeletal:  No deformities, BUE and BLE strength normal and equal Skin: warm and dry  Neuro:  CNs 2-12 intact, no focal abnormalities noted Psych:  Normal affect   EKG:  The EKG was personally reviewed and demonstrates: V paced, HR 74 Telemetry:  Telemetry was personally reviewed and demonstrates:  V paced, HR 70-80's with PVC's   Relevant CV Studies: Lexiscan  07/2015 Defect 1: There is a large defect of severe severity present  in the basal inferior, basal inferolateral, basal anterolateral, mid inferior, mid inferolateral, mid anterolateral, apical inferior and apical lateral location. Findings consistent with prior myocardial infarction. This is a high risk study. Nuclear stress EF: 29%. The aforementioned walls are severely hypokinetic to akinetic  ECHO IMPRESSIONS 03/30/2024  1. No LV thrombus by Definity . Left ventricular ejection fraction, by  estimation, is 20 to 25%. The left ventricle has severely decreased  function. The left ventricle demonstrates global hypokinesis. The left  ventricular internal cavity size was mildly  dilated. There is mild concentric left ventricular hypertrophy. Left  ventricular diastolic parameters are indeterminate. There is the  interventricular septum is flattened in systole and diastole, consistent  with right ventricular pressure and volume  overload.   2. Right ventricular systolic function is mildly reduced. The right  ventricular size is moderately enlarged. There is normal pulmonary artery  systolic pressure. The estimated right ventricular systolic pressure is  32.8 mmHg.   3. Left atrial size was mildly dilated.   4. S/p Sorin Memo 3D ring annuloplasty (size 30mm, Catalog #[id number], serial  [id number]). The mitral valve has been repaired/replaced. Mild mitral valve  regurgitation. No evidence of mitral stenosis. The mean mitral valve  gradient is 3.0 mmHg with average  heart rate of 85 bpm. Procedure Date: 10/17/11. Echo findings are  consistent with normal structure and function of the mitral valve  prosthesis.   5. The tricuspid valve is abnormal.  Tricuspid valve regurgitation is  moderate.   6. The aortic valve is tricuspid. There is moderate calcification of the  aortic valve. There is moderate thickening of the aortic valve. Aortic  valve regurgitation is trivial. Aortic valve sclerosis/calcification is  present, without any evidence of  aortic stenosis.   7. The  inferior vena cava is normal in size with <50% respiratory  variability, suggesting right atrial pressure of 8 mmHg.   Laboratory Data: High Sensitivity Troponin:   Recent Labs  Lab 03/29/24 1120 03/29/24 1302 04/16/24 1029 04/16/24 1155  TROPONINIHS 19* 19* 25* 25*     Chemistry Recent Labs  Lab 04/16/24 1029 04/17/24 0358  NA 136 139  K 3.4* 3.8  CL 99 102  CO2 26 24  GLUCOSE 130* 74  BUN 36* 37*  CREATININE 2.75* 2.60*  CALCIUM  8.2* 8.4*  MG  --  2.4  GFRNONAA 22* 23*  ANIONGAP 11 13    Recent Labs  Lab 04/16/24 1029  PROT 6.1*  ALBUMIN  3.2*  AST 25  ALT 20  ALKPHOS 75  BILITOT 1.2   Lipids No results for input(s): CHOL, TRIG, HDL, LABVLDL, LDLCALC, CHOLHDL in the last 168 hours.  Hematology Recent Labs  Lab 04/16/24 1029 04/17/24 0358  WBC 10.8* 12.6*  RBC 4.45 4.60  HGB 13.1 13.5  HCT 41.2 42.4  MCV 92.6 92.2  MCH 29.4 29.3  MCHC 31.8 31.8  RDW 15.3 15.3  PLT 206 203   Thyroid No results for input(s): TSH, FREET4 in the last 168 hours.  BNP Recent Labs  Lab 04/16/24 1029 04/17/24 0358  BNP 681.0* 927.0*    DDimer No results for input(s): DDIMER in the last 168 hours.  Radiology/Studies:  DG Chest Portable 1 View Result Date: 04/16/2024 CLINICAL DATA:  Weakness. EXAM: PORTABLE CHEST 1 VIEW COMPARISON:  March 29, 2024. FINDINGS: Stable cardiomegaly. Sternotomy wires are noted. Left-sided defibrillator is unchanged. Elevated right hemidiaphragm is noted. Left lung base is not completely included in field-of-view. Visualized lung parenchyma is unremarkable. Bony thorax is unremarkable. IMPRESSION: Left lung base is not completely included in field-of-view. No definite abnormality seen in the visualized lung parenchyma. Electronically Signed   By: Lynwood Landy Raddle M.D.   On: 04/16/2024 12:06     Assessment and Plan: Acute on chronic HFrEF ECHO: 03/30/2024 LVEF 20 to 25%, severely decreased LV function, global hypokinesis, mildly  dilated LV, mildly concentric LVH, C/W RV overload, mildly reduced RV function, moderately enlarged RV, mildly dilated LA BNP 681 > 927, WBC 10.8 > 12.6 with neutrophils 8.4 > 9.5 Reports weight gain and lower extremity edema.  Notes improvement in edema since IV diuresis.  Denies SOB. Appears Volume overload on exam . I/O -470, Cr 2.75 > 2.6, Wt 244 lbs (8/4 234, 8/14 248), Reds vest pending Treated with Lasix  40 mg IV.  Continue on IV Lasix  40 mg twice daily.  GDMT limited due to his current kidney disease.  Can consider trying torsemide as alternative was switched back to oral diuretics.  Continue on Farxiga  10 mg daily and carvedilol  12.5 mg twice daily Encouraged low sodium diet, fluid restriction <2L, and daily weights. Most likely secondary to kidney disease and underlying unknown infection.    Hypokalemia Treated with potassium supplement K 3.4 now 3.8. MG 2.4   CAD s/p CABG x 3  HLD, LDL goal <55 Lexiscan  07/2015: High risk studies with findings consistent with prior MI EKG without any ischemic changes.  TN 25 > 25. Most  likely demand ischemia.  Stable with no anginal symptoms.  No indication for ischemic evaluation at this time. 05/2023 LDL 69 Not on ASA due to being on Eliquis  Continue Crestor  5 mg  Would consider increasing Crestor  at next OV.   Hx of MV repair, valvular insufficiency ECHO: 03/30/2024 MV prosthesis well-seated and normal structure/function with mild MV regurgitation,  moderate TV regurgitation, moderate calcification/thickening of AV without AS, trivial AV regurgitation Continue to monitor  HTN BP well controlled today: 127/71 Managed by GDMT as above.  Encourage heart healthy low sodium diet. Discussed limiting sodium intake to < 2 grams daily.     Persistent Afib  S/p successful DCCV 04/01/2024 No signs of recurrent A-fib On exam noted regular, rate and rhythm.  Denies palpitations or active bleeding.  Continue on Eliquis  2.5 mg twice daily, Coreg   12.5 mg twice daily  Sinus node dysfunction s/p ICD 2013 with upgrade of Biotronik BiV ICD 09/2020 Device interrogation 02/2024: AP/VP rhythm with no treated arrhythmias Continue to follow with EP  CKD Stage IV  CR 2.75 now 2.6 Continue to follow Cr trend Encourage to follow up with referral to nephrology, Dr. Rachele.    Risk Assessment/Risk Scores:  [place]  Heart Association (NYHA) Functional Class NYHA Class II  CHA2DS2-VASc Score = 6  This indicates a 9.7% annual risk of stroke. The patient's score is based upon: CHF History: 1 HTN History: 1 Diabetes History: 1 Stroke History: 0 Vascular Disease History: 1 Age Score: 2 Gender Score: 0    For questions or updates, please contact [facility] Please consult [email or web address] for contact info under    Signed, Lorette CINDERELLA Kapur, PA-C  04/17/2024 8:38 AM

## 2024-04-17 NOTE — Progress Notes (Signed)
 PROGRESS NOTE  Wayne Gordon FMW:[id number] DOB: [birth year] DOA: 04/16/2024 PCP: Renato Dorothey HERO, NP  Brief History:  89 year old male with HFrEF, CAD status post CABG and mitral valve repair 2013, LVEF 20-25%, status post ICD, pacer placement, stage IV CKD, persistent atrial fibrillation/atrial flutter on chronic anticoagulation with apixaban  status post DCCV 84/25, ype 2 diabetes mellitus on insulin , with renal complications, hyperlipidemia, hypertension, coronary artery disease, status post mitral valve repair, recent hospitalizations, recently discharged 04/01/2024 with a discharge weight of 234 pounds at that time.  EP had performed a cardioversion on 8/4 for symptomatic atrial flutter.  The patient was admitted to the hospital from 03/29/2024 to 04/01/2024 for acute on chronic HFrEF.   He was discharged home on furosemide  40 mg p.o. daily.  Since hospital discharge, his weight has steadily climbed despite taking oral diuretics at home.  He is now up to 247 pounds.  He is increasingly becoming weaker.  He saw cardiology in the outpatient setting recently and was taken off amiodarone  04/05/24 due to patient report of arm and leg weakness.  He was brought into the emergency department because he continued to have swelling and weight gain in his legs and worsening shortness of breath.  He complained of severe fatigue and weakness and was unable to get out of bed this morning because he was so weak.  He was advised by the cardiology office to call EMS and come to the ED.  He was noted to be severely deconditioned and frail with significant peripheral edema, elevated BNP of 681, troponin 25, potassium 3.4, creatinine 2.75 and request was made for inpatient hospitalization for IV diuresis as he is failing outpatient diuresis with oral medication likely due to advanced renal failure.   Assessment/Plan: Acute on chronic HFrEF - 03/30/2024 echo EF 20-25%, global HK, mild decreased RVF, s/p MV  annuloplasty - Remains clinically fluid overloaded - Continue IV furosemide --increased to 80 mg IV twice daily - Cardiology consult - Daily weights - Accurate I's and O's - 04/01/2024 discharge weight 234 lbs - Admission weight 247 lbs  Persistent atrial fibrillation - Status post DCCV 04/01/2024 - Continue carvedilol  - Continue apixaban  - Previously on amiodarone , but discontinued secondary to dizziness and generalized weakness  Acute on chronic CKD stage IV - Baseline creatinine 1.9-2.1 - Presented with serum creatinine 2.75 - Monitor with diuresis - Secondary to cardiorenal syndrome  Coronary artery disease /elevated troponin - Status post CABG -Secondary to demand ischemia - No angina - Continue Crestor  - Currently on apixaban   Diabetes mellitus type 2, uncontrolled with hyperglycemia - 03/29/2024 hemoglobin A1c 7.7 - Continue NovoLog  sliding scale - Continue Semglee  reduced dose - He is on Toujeo  26 units daily at home  Generalized weakness - Secondary to decompensated heart failure and electrolyte derangements - B12 - Folic acid - TSH - PT evaluation  Hypokalemia - Replete - Check magnesium   Mixed hyperlipidemia - Continue statin  Sinus node dysfunction s/p ICD 2013 with upgrade of Biotronik BiV ICD 09/2020 - Device interrogation 02/2024: AP/VP rhythm with no treated arrhythmias - Continue to follow with EP           Family Communication:   daughter at bedside 8/20  Consultants:  cardiology  Code Status:  FULL  DVT Prophylaxis: apixaban    Procedures: As Listed in Progress Note Above  Antibiotics: None       Subjective: Patient states that his breathing is low but better.  He  denies any chest pain, abdominal pain, nausea, vomiting, diarrhea.  He has nonproductive cough.  Objective: Vitals:   04/16/24 2243 04/17/24 0300 04/17/24 0400 04/17/24 1246  BP: 106/65 127/71  116/70  Pulse: 70 73  71  Resp: 18 18  19   Temp: 100.1 F (37.8  C) 98.8 F (37.1 C)  98.2 F (36.8 C)  TempSrc: Oral Oral  Oral  SpO2: 90% 90%  93%  Weight:   111.1 kg   Height:        Intake/Output Summary (Last 24 hours) at 04/17/2024 1656 Last data filed at 04/17/2024 0406 Gross per 24 hour  Intake 480 ml  Output 950 ml  Net -470 ml   Weight change:  Exam:  General:  Pt is alert, follows commands appropriately, not in acute distress HEENT: No icterus, No thrush, No neck mass, [place]/AT Cardiovascular: RRR, S1/S2, no rubs, no gallops Respiratory: Bilateral rales.  No wheezing. Abdomen: Soft/+BS, non tender, non distended, no guarding Extremities: 1 + LE edema, No lymphangitis, No petechiae, No rashes, no synovitis   Data Reviewed: I have personally reviewed following labs and imaging studies Basic Metabolic Panel: Recent Labs  Lab 04/16/24 1029 04/17/24 0358  NA 136 139  K 3.4* 3.8  CL 99 102  CO2 26 24  GLUCOSE 130* 74  BUN 36* 37*  CREATININE 2.75* 2.60*  CALCIUM  8.2* 8.4*  MG  --  2.4   Liver Function Tests: Recent Labs  Lab 04/16/24 1029  AST 25  ALT 20  ALKPHOS 75  BILITOT 1.2  PROT 6.1*  ALBUMIN  3.2*   No results for input(s): LIPASE, AMYLASE in the last 168 hours. No results for input(s): AMMONIA in the last 168 hours. Coagulation Profile: No results for input(s): INR, PROTIME in the last 168 hours. CBC: Recent Labs  Lab 04/16/24 1029 04/17/24 0358  WBC 10.8* 12.6*  NEUTROABS 8.4* 9.5*  HGB 13.1 13.5  HCT 41.2 42.4  MCV 92.6 92.2  PLT 206 203   Cardiac Enzymes: No results for input(s): CKTOTAL, CKMB, CKMBINDEX, TROPONINI in the last 168 hours. BNP: Invalid input(s): POCBNP CBG: Recent Labs  Lab 04/17/24 0722 04/17/24 0735 04/17/24 0756 04/17/24 1107 04/17/24 1625  GLUCAP 57* 65* 80 197* 258*   HbA1C: No results for input(s): HGBA1C in the last 72 hours. Urine analysis:    Component Value Date/Time   COLORURINE YELLOW 04/16/2024 1104   APPEARANCEUR CLEAR 04/16/2024  1104   LABSPEC 1.014 04/16/2024 1104   PHURINE 5.0 04/16/2024 1104   GLUCOSEU >=500 (A) 04/16/2024 1104   HGBUR NEGATIVE 04/16/2024 1104   BILIRUBINUR NEGATIVE 04/16/2024 1104   KETONESUR NEGATIVE 04/16/2024 1104   PROTEINUR NEGATIVE 04/16/2024 1104   UROBILINOGEN 0.2 02/04/2014 0939   NITRITE NEGATIVE 04/16/2024 1104   LEUKOCYTESUR NEGATIVE 04/16/2024 1104   Sepsis Labs: @LABRCNTIP (procalcitonin:4,lacticidven:4) ) Recent Results (from the past 240 hours)  Resp panel by RT-PCR (RSV, Flu A&B, Covid) Anterior Nasal Swab     Status: None   Collection Time: 04/16/24 11:04 AM   Specimen: Anterior Nasal Swab  Result Value Ref Range Status   SARS Coronavirus 2 by RT PCR NEGATIVE NEGATIVE Final    Comment: (NOTE) SARS-CoV-2 target nucleic acids are NOT DETECTED.  The SARS-CoV-2 RNA is generally detectable in upper respiratory specimens during the acute phase of infection. The lowest concentration of SARS-CoV-2 viral copies this assay can detect is 138 copies/mL. A negative result does not preclude SARS-Cov-2 infection and should not be used as the sole basis  for treatment or other patient management decisions. A negative result may occur with  improper specimen collection/handling, submission of specimen other than nasopharyngeal swab, presence of viral mutation(s) within the areas targeted by this assay, and inadequate number of viral copies(<138 copies/mL). A negative result must be combined with clinical observations, patient history, and epidemiological information. The expected result is Negative.  Fact Sheet for Patients:  BloggerCourse.com  Fact Sheet for Healthcare Providers:  SeriousBroker.it  This test is no t yet approved or cleared by the [place]  FDA and  has been authorized for detection and/or diagnosis of SARS-CoV-2 by FDA under an Emergency Use Authorization (EUA). This EUA will remain  in effect (meaning  this test can be used) for the duration of the COVID-19 declaration under Section 564(b)(1) of the Act, 21 U.S.C.section 360bbb-3(b)(1), unless the authorization is terminated  or revoked sooner.       Influenza A by PCR NEGATIVE NEGATIVE Final   Influenza B by PCR NEGATIVE NEGATIVE Final    Comment: (NOTE) The Xpert Xpress SARS-CoV-2/FLU/RSV plus assay is intended as an aid in the diagnosis of influenza from Nasopharyngeal swab specimens and should not be used as a sole basis for treatment. Nasal washings and aspirates are unacceptable for Xpert Xpress SARS-CoV-2/FLU/RSV testing.  Fact Sheet for Patients: BloggerCourse.com  Fact Sheet for Healthcare Providers: SeriousBroker.it  This test is not yet approved or cleared by the [place]  FDA and has been authorized for detection and/or diagnosis of SARS-CoV-2 by FDA under an Emergency Use Authorization (EUA). This EUA will remain in effect (meaning this test can be used) for the duration of the COVID-19 declaration under Section 564(b)(1) of the Act, 21 U.S.C. section 360bbb-3(b)(1), unless the authorization is terminated or revoked.     Resp Syncytial Virus by PCR NEGATIVE NEGATIVE Final    Comment: (NOTE) Fact Sheet for Patients: BloggerCourse.com  Fact Sheet for Healthcare Providers: SeriousBroker.it  This test is not yet approved or cleared by the [place]  FDA and has been authorized for detection and/or diagnosis of SARS-CoV-2 by FDA under an Emergency Use Authorization (EUA). This EUA will remain in effect (meaning this test can be used) for the duration of the COVID-19 declaration under Section 564(b)(1) of the Act, 21 U.S.C. section 360bbb-3(b)(1), unless the authorization is terminated or revoked.  Performed at [facility], [address] [place], [place] [address]      Scheduled Meds:  apixaban   2.5  mg Oral BID   carvedilol   12.5 mg Oral BID WC   dapagliflozin  propanediol  10 mg Oral QHS   furosemide   80 mg Intravenous BID   gabapentin   100 mg Oral QHS   insulin  aspart  0-9 Units Subcutaneous TID WC   insulin  aspart  3 Units Subcutaneous TID WC   insulin  glargine-yfgn  10 Units Subcutaneous QHS   pantoprazole   40 mg Oral QAC breakfast   potassium chloride   40 mEq Oral BID   rosuvastatin   5 mg Oral QPM   sodium chloride  flush  3 mL Intravenous Q12H   Continuous Infusions:  Procedures/Studies: DG Chest Portable 1 View Result Date: 04/16/2024 CLINICAL DATA:  Weakness. EXAM: PORTABLE CHEST 1 VIEW COMPARISON:  March 29, 2024. FINDINGS: Stable cardiomegaly. Sternotomy wires are noted. Left-sided defibrillator is unchanged. Elevated right hemidiaphragm is noted. Left lung base is not completely included in field-of-view. Visualized lung parenchyma is unremarkable. Bony thorax is unremarkable. IMPRESSION: Left lung base is not completely included in field-of-view. No definite abnormality seen in the  visualized lung parenchyma. Electronically Signed   By: Lynwood Landy Raddle M.D.   On: 04/16/2024 12:06   EP STUDY Result Date: 04/01/2024 See surgical note for result.  ECHOCARDIOGRAM COMPLETE Result Date: 03/30/2024    ECHOCARDIOGRAM REPORT   Patient Name:   Wayne Gordon Date of Exam: 03/30/2024 Medical Rec #:  [id number]      Height:       76.0 in Accession #:    [id number]     Weight:       242.9 lb Date of Birth:  Dec 09, 1933       BSA:          2.406 m Patient Age:    89 years       BP:           106/66 mmHg Patient Gender: M              HR:           80 bpm. Exam Location:  Inpatient Procedure: 2D Echo, Cardiac Doppler, Color Doppler and Intracardiac            Opacification Agent (Both Spectral and Color Flow Doppler were            utilized during procedure). Indications:    I50.21 Acute systolic (congestive) heart failure  History:        Patient has prior history of Echocardiogram examinations,  most                 recent 10/15/2021. CHF, CAD, Prior CABG and Defibrillator,                 Arrythmias:Atrial Fibrillation; Risk Factors:Hypertension and                 Diabetes.  Sonographer:    Damien Senior RDCS Referring Phys: [phone] JACOB J STINSON IMPRESSIONS  1. No LV thrombus by Definity . Left ventricular ejection fraction, by estimation, is 20 to 25%. The left ventricle has severely decreased function. The left ventricle demonstrates global hypokinesis. The left ventricular internal cavity size was mildly dilated. There is mild concentric left ventricular hypertrophy. Left ventricular diastolic parameters are indeterminate. There is the interventricular septum is flattened in systole and diastole, consistent with right ventricular pressure and volume overload.  2. Right ventricular systolic function is mildly reduced. The right ventricular size is moderately enlarged. There is normal pulmonary artery systolic pressure. The estimated right ventricular systolic pressure is 32.8 mmHg.  3. Left atrial size was mildly dilated.  4. S/p Sorin Memo 3D ring annuloplasty (size 30mm, Catalog #[id number], serial [id number]). The mitral valve has been repaired/replaced. Mild mitral valve regurgitation. No evidence of mitral stenosis. The mean mitral valve gradient is 3.0 mmHg with average heart rate of 85 bpm. Procedure Date: 10/17/11. Echo findings are consistent with normal structure and function of the mitral valve prosthesis.  5. The tricuspid valve is abnormal. Tricuspid valve regurgitation is moderate.  6. The aortic valve is tricuspid. There is moderate calcification of the aortic valve. There is moderate thickening of the aortic valve. Aortic valve regurgitation is trivial. Aortic valve sclerosis/calcification is present, without any evidence of aortic stenosis.  7. The inferior vena cava is normal in size with <50% respiratory variability, suggesting right atrial pressure of 8 mmHg. FINDINGS  Left Ventricle: No LV  thrombus by Definity . Left ventricular ejection fraction, by estimation, is 20 to 25%. The left ventricle has severely decreased function. The left ventricle demonstrates global hypokinesis. Definity   contrast agent was given IV to delineate the left ventricular endocardial borders. Strain was performed and the global longitudinal strain is indeterminate. The left ventricular internal cavity size was mildly dilated. There is mild concentric left ventricular hypertrophy. The interventricular septum is flattened in systole and diastole, consistent with right ventricular pressure and volume overload. Left ventricular diastolic function could not be evaluated due to mitral valve repair. Left ventricular diastolic parameters are  indeterminate. Right Ventricle: The right ventricular size is moderately enlarged. No increase in right ventricular wall thickness. Right ventricular systolic function is mildly reduced. There is normal pulmonary artery systolic pressure. The tricuspid regurgitant velocity is 2.49 m/s, and with an assumed right atrial pressure of 8 mmHg, the estimated right ventricular systolic pressure is 32.8 mmHg. Left Atrium: Left atrial size was mildly dilated. Right Atrium: Right atrial size was normal in size. Pericardium: There is no evidence of pericardial effusion. Mitral Valve: S/p Sorin Memo 3D ring annuloplasty (size 30mm, Catalog #[id number], serial [id number]). The mitral valve has been repaired/replaced. Mild mitral valve regurgitation. There is a 30 mm Mitral Memo ring prosthetic annuloplasty ring present in the mitral position. Echo findings are consistent with normal structure and function of the mitral valve prosthesis. No evidence of mitral valve stenosis. MV peak gradient, 6.6 mmHg. The mean mitral valve gradient is 3.0 mmHg with average heart rate of 85 bpm. Tricuspid Valve: The tricuspid valve is abnormal. Tricuspid valve regurgitation is moderate. Aortic Valve: The aortic valve is tricuspid.  There is moderate calcification of the aortic valve. There is moderate thickening of the aortic valve. Aortic valve regurgitation is trivial. Aortic valve sclerosis/calcification is present, without any evidence of aortic stenosis. Pulmonic Valve: The pulmonic valve was not well visualized. Pulmonic valve regurgitation is trivial. Aorta: The aortic root and ascending aorta are structurally normal, with no evidence of dilitation. Venous: The inferior vena cava is normal in size with less than 50% respiratory variability, suggesting right atrial pressure of 8 mmHg. IAS/Shunts: No atrial level shunt detected by color flow Doppler. Additional Comments: 3D was performed not requiring image post processing on an independent workstation and was indeterminate. A device lead is visualized in the right atrium and right ventricle.  LEFT VENTRICLE PLAX 2D LVIDd:         5.10 cm LVIDs:         4.80 cm LV PW:         1.20 cm LV IVS:        1.20 cm LVOT diam:     2.00 cm LV SV:         33 LV SV Index:   14 LVOT Area:     3.14 cm  LEFT ATRIUM              Index        RIGHT ATRIUM           Index LA diam:        4.60 cm  1.91 cm/m   RA Area:     18.60 cm LA Vol (A2C):   107.0 ml 44.47 ml/m  RA Volume:   51.50 ml  21.40 ml/m LA Vol (A4C):   79.8 ml  33.17 ml/m LA Biplane Vol: 93.3 ml  38.78 ml/m  AORTIC VALVE LVOT Vmax:   54.30 cm/s LVOT Vmean:  37.400 cm/s LVOT VTI:    0.104 m  AORTA Ao Root diam: 3.30 cm Ao Asc diam:  3.70 cm MITRAL VALVE  TRICUSPID VALVE MV Area VTI:  1.34 cm   TR Peak grad:   24.8 mmHg MV Peak grad: 6.6 mmHg   TR Vmax:        249.00 cm/s MV Mean grad: 3.0 mmHg MV Vmax:      1.29 m/s   SHUNTS MV Vmean:     80.7 cm/s  Systemic VTI:  0.10 m                          Systemic Diam: 2.00 cm Vishnu Priya Mallipeddi Electronically signed by Diannah Late Mallipeddi Signature Date/Time: 03/30/2024/11:31:43 AM    Final    DG Chest Portable 1 View Result Date: 03/29/2024 CLINICAL DATA:  Weakness.   Dizziness. EXAM: PORTABLE CHEST 1 VIEW COMPARISON:  10/15/2021. FINDINGS: Low lung volume. Mild diffuse pulmonary vascular congestion, likely accentuated by low lung volume. No frank pulmonary edema. There are probable atelectatic changes at the lung bases. There is subtle blunting of bilateral lateral costophrenic angles, which may be due to trace pleural effusions versus pleural thickening. No pneumothorax on either side. Stable cardio-mediastinal silhouette. There are surgical staples along the heart border and sternotomy wires, status post CABG (coronary artery bypass graft). There is a left sided 3-lead pacemaker. Neurostimulator device noted overlying the lower thoracic spine region. No acute osseous abnormalities. The soft tissues are within normal limits. IMPRESSION: Mild diffuse pulmonary vascular congestion, likely accentuated by low lung volume. No frank pulmonary edema. Electronically Signed   By: Ree Molt M.D.   On: 03/29/2024 12:09    Alm Schneider, DO  [facility]  If 7PM-7AM, please contact night-coverage [email or web address] Password [email or web address] 04/17/2024, 4:56 PM   LOS: 1 day

## 2024-04-17 NOTE — Plan of Care (Signed)
  Problem: Skin Integrity: Goal: Risk for impaired skin integrity will decrease Outcome: Progressing   Problem: Activity: Goal: Risk for activity intolerance will decrease Outcome: Progressing

## 2024-04-17 NOTE — Plan of Care (Signed)
  Problem: Acute Rehab OT Goals (only OT should resolve) Goal: Pt. Will Perform Grooming Flowsheets (Taken 04/17/2024 1038) Pt Will Perform Grooming:  with contact guard assist  standing Goal: Pt. Will Perform Lower Body Bathing Flowsheets (Taken 04/17/2024 1038) Pt Will Perform Lower Body Bathing:  with modified independence  sitting/lateral leans Goal: Pt. Will Perform Upper Body Dressing Flowsheets (Taken 04/17/2024 1038) Pt Will Perform Upper Body Dressing:  with modified independence  sitting Goal: Pt. Will Perform Lower Body Dressing Flowsheets (Taken 04/17/2024 1038) Pt Will Perform Lower Body Dressing:  with modified independence  sitting/lateral leans Goal: Pt. Will Transfer To Toilet Flowsheets (Taken 04/17/2024 1038) Pt Will Transfer to Toilet:  with modified independence  ambulating Goal: Pt. Will Perform Toileting-Clothing Manipulation Flowsheets (Taken 04/17/2024 1038) Pt Will Perform Toileting - Clothing Manipulation and hygiene:  with modified independence  sitting/lateral leans Goal: Pt/Caregiver Will Perform Home Exercise Program Flowsheets (Taken 04/17/2024 1038) Pt/caregiver will Perform Home Exercise Program:  Increased strength  Increased ROM  Both right and left upper extremity  Independently  Dimitrius Steedman OT, MOT

## 2024-04-17 NOTE — Plan of Care (Signed)
  Problem: Acute Rehab PT Goals(only PT should resolve) Goal: Pt Will Go Supine/Side To Sit Outcome: Progressing Flowsheets (Taken 04/17/2024 1018) Pt will go Supine/Side to Sit: with contact guard assist Goal: Patient Will Perform Sitting Balance Outcome: Progressing Flowsheets (Taken 04/17/2024 1018) Patient will perform sitting balance: with contact guard assist Goal: Patient Will Transfer Sit To/From Stand Outcome: Progressing Flowsheets (Taken 04/17/2024 1018) Patient will transfer sit to/from stand: with contact guard assist Goal: Pt Will Transfer Bed To Chair/Chair To Bed Outcome: Progressing Flowsheets (Taken 04/17/2024 1018) Pt will Transfer Bed to Chair/Chair to Bed: with contact guard assist Goal: Pt Will Ambulate Outcome: Progressing Flowsheets (Taken 04/17/2024 1018) Pt will Ambulate:  10 feet  with minimal assist  with rolling walker   10:18 AM, 04/17/24 Rosaria Settler, PT, DPT [place] with [facility]

## 2024-04-17 NOTE — Inpatient Diabetes Management (Signed)
 Inpatient Diabetes Program Recommendations  AACE/ADA: New Consensus Statement on Inpatient Glycemic Control   Target Ranges:  Prepandial:   less than 140 mg/dL      Peak postprandial:   less than 180 mg/dL (1-2 hours)      Critically ill patients:  140 - 180 mg/dL    Latest Reference Range & Units 04/16/24 10:09 04/16/24 12:48 04/16/24 15:59 04/17/24 04:02 04/17/24 07:22 04/17/24 07:35 04/17/24 07:56  Glucose-Capillary 70 - 99 mg/dL 878 (H) 95 883 (H) 80 57 (L) 65 (L) 80   Review of Glycemic Control  Diabetes history: DM2 Outpatient Diabetes medications: Farxiga  10 mg daily, Toujeo  26 units daily, Humalog  4 units with lunch and 10  units with supper Current orders for Inpatient glycemic control: Semglee  10 units at bedtime, Novolog  0-9 units TID with meals, Novolog  3 units TID with meals,   Inpatient Diabetes Program Recommendations:    Insulin : CBG 57 mg/dl today, patient received Semglee  10 units last night. Please consider decreasing Semglee  to 5 units at bedtime and discontinuing Novolog  3 units TID with meals.   Thanks, Earnie Gainer, RN, MSN, CDCES Diabetes Coordinator Inpatient Diabetes Program [phone] (Team Pager from 8am to 5pm)

## 2024-04-17 NOTE — TOC Initial Note (Signed)
 Transition of Care [facility]) - Initial/Assessment Note    Patient Details  Name: Wayne Gordon MRN: [id number] Date of Birth: 1934/06/18  Transition of Care [facility]) CM/SW Contact:    Lucie Lunger, LCSWA Phone Number: 04/17/2024, 11:25 AM  Clinical Narrative:                 CSW notes PT is recommending SNF for pt at D/C. CSW spoke with pt at bedside to complete assessment. Pt lives with his spouse and son. Pt is normally able to complete ADLs but has been needing assistance recently. Pts family provides transportation. Pt has not had HH and pt has a walker to use in the home. Pt states he weighs himself daily and tries to follow a heart healthy diet. Pt states he takes medications as prescribed. CSW spoke with pt about PT eval, he states he is not sure about SNF placement and gave approval for CSW to speak with his spouse. CSW spoke with Ms. Mears who states they do not want pt to go to SNF but prefer for him to come home with [facility] services, they do not have agency preference. CSW spoke to [place] with [facility] and he is able to accept referral. TOC to follow.   Expected Discharge Plan: Home w Home Health Services Barriers to Discharge: Continued Medical Work up   Patient Goals and CMS Choice Patient states their goals for this hospitalization and ongoing recovery are:: return home with [facility] CMS Medicare.gov Compare Post Acute Care list provided to:: Patient Choice offered to / list presented to : Patient, Spouse      Expected Discharge Plan and Services In-house Referral: Clinical Social Work Discharge Planning Services: CM Consult Post Acute Care Choice: Home Health Living arrangements for the past 2 months: Single Family Home                           HH Arranged: PT, OT HH Agency: [facility] Date [facility] Agency Contacted: 04/17/24   Representative spoke with at [facility] Agency: Darleene  Prior Living Arrangements/Services Living arrangements for the past 2 months: Single Family  Home Lives with:: Spouse, Adult Children Patient language and need for interpreter reviewed:: Yes Do you feel safe going back to the place where you live?: Yes      Need for Family Participation in Patient Care: Yes (Comment) Care giver support system in place?: Yes (comment) Current home services: DME Criminal Activity/Legal Involvement Pertinent to Current Situation/Hospitalization: No - Comment as needed  Activities of Daily Living   ADL Screening (condition at time of admission) Independently performs ADLs?: No Does the patient have a NEW difficulty with bathing/dressing/toileting/self-feeding that is expected to last >3 days?: Yes (Initiates electronic notice to provider for possible OT consult) Does the patient have a NEW difficulty with getting in/out of bed, walking, or climbing stairs that is expected to last >3 days?: No Does the patient have a NEW difficulty with communication that is expected to last >3 days?: No Is the patient deaf or have difficulty hearing?: No Does the patient have difficulty seeing, even when wearing glasses/contacts?: No Does the patient have difficulty concentrating, remembering, or making decisions?: No  Permission Sought/Granted                  Emotional Assessment Appearance:: Appears stated age Attitude/Demeanor/Rapport: Engaged Affect (typically observed): Accepting Orientation: : Oriented to Self, Oriented to Place, Oriented to  Time, Oriented to  Situation Alcohol / Substance Use: Not Applicable Psych Involvement: No (comment)  Admission diagnosis:  Hypoglycemia [E16.2] Acute on chronic systolic congestive heart failure (HCC) [I50.23] Generalized weakness [R53.1] Dyspnea, unspecified type [R06.00] Acute HFrEF (heart failure with reduced ejection fraction) (HCC) [I50.21] Patient Active Problem List   Diagnosis Date Noted   Acute HFrEF (heart failure with reduced ejection fraction) (HCC) 04/16/2024   Chronic kidney disease (CKD),  stage IV (severe) (HCC) 04/16/2024   Abnormal weight gain 04/16/2024   Hypokalemia 04/16/2024   Atrial flutter (HCC) 04/01/2024   Acute CHF (congestive heart failure) (HCC) 03/29/2024   Human metapneumovirus pneumonia 10/16/2021   Acute on chronic systolic CHF (congestive heart failure) (HCC) 10/16/2021   CHF exacerbation (HCC) 10/15/2021   CAD (coronary artery disease) of artery bypass graft 10/15/2021   History of implantable cardioverter-defibrillator (ICD) placement 10/15/2021   Benign essential HTN 10/15/2021   Chronic kidney disease, stage 3b (HCC) 10/15/2021   Acute respiratory failure with hypoxia (HCC) 10/14/2021   Bloating 02/27/2020   Nausea with vomiting 02/27/2020   Abdominal pain 02/27/2020   Iron deficiency anemia 02/08/2018   Diabetes mellitus (HCC) 02/08/2018   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   Bacteremia due to Klebsiella pneumoniae 09/22/2017   Sepsis due to Klebsiella pneumoniae (HCC) 09/22/2017   Cholangitis 09/17/2017   Leukocytosis 09/17/2017   Cholelithiasis 09/17/2017   Elevated liver enzymes 09/17/2017   Acute renal failure superimposed on stage 3 chronic kidney disease (HCC) 09/17/2017   Low blood pressure reading 09/17/2017   Lactic acidosis 09/17/2017   Elevated troponin 09/17/2017   Nausea without vomiting 01/10/2017   Gastrointestinal hemorrhage associated with intestinal diverticulosis 06/23/2016   Carotid artery disease (HCC)    Syncope and collapse 07/11/2015   Ventricular arrhythmia 07/10/2015   Complete heart block (HCC)    ICD (implantable cardioverter-defibrillator) discharge    Atrial fibrillation (HCC) 06/04/2015   Diabetes (HCC) 06/02/2015   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/02/2015   GERD (gastroesophageal reflux disease) 02/18/2015   Abdominal pain, epigastric 02/18/2015   Pleural effusion 09/01/2014   Rectal bleeding 03/25/2014   Acute blood loss anemia 03/25/2014   Adult body mass index 28.0-28.9 09/30/2013   Diabetes  mellitus, type 2 (HCC) 08/09/2013   Type 2 diabetes mellitus without complication (HCC) 08/09/2013   PVC's (premature ventricular contractions) 07/04/2013   Hyperlipidemia 01/03/2013   Automatic implantable cardioverter-defibrillator in situ 03/30/2012   S/P MVR (mitral valve repair) 01/16/2012   HTN (hypertension) 11/28/2011   Chronic systolic heart failure (HCC) 10/31/2011   Ischemic cardiomyopathy 10/31/2011   CKD (chronic kidney disease) stage 3, GFR 30-59 ml/min (HCC) 10/31/2011   Current use of long term anticoagulation 10/31/2011   Long term (current) use of anticoagulants 10/28/2011   S/P mitral valve repair 10/17/2011   S/P CABG x 3 10/17/2011   Mitral regurgitation 10/12/2011   NSTEMI (non-ST elevated myocardial infarction) (HCC) 10/11/2011   PCP:  Renato Dorothey HERO, NP Pharmacy:   [facility] [place], [place] - [address] [place] [address] Phone: [phone] Fax: [phone]  [facility] - [place], [place] - [address] [place] [address] Phone: [phone] Fax: [phone]  [facility] - [place], [place] - [address] [place] [address] Phone: [phone] Fax: [phone]     Social Drivers of Health (SDOH) Social History: SDOH Screenings   Food Insecurity: No Food Insecurity (04/16/2024)  Housing: Low Risk  (04/16/2024)  Transportation Needs: No Transportation Needs (04/16/2024)  Utilities: Not At Risk (04/16/2024)  Financial Resource Strain: Medium Risk (09/05/2022)   Received from [facility]  Physical Activity: Unknown (09/05/2022)   Received from [facility]  Social Connections: Socially Integrated (04/16/2024)  Recent Concern: Social Connections - Moderately Isolated (03/29/2024)  Stress: No Stress Concern Present (09/05/2022)   Received from [facility]  Tobacco Use: Low Risk  (04/16/2024)  Health Literacy: Low Risk  (04/06/2020)    Received from [facility]   SDOH Interventions:     Readmission Risk Interventions    04/17/2024   11:19 AM 04/01/2024   10:47 AM 10/15/2021    9:16 AM  Readmission Risk Prevention Plan  Medication Screening   Complete  Transportation Screening Complete Complete Complete  PCP or Specialist Appt within 5-7 Days  Complete   Home Care Screening Complete Complete   Medication Review (RN CM) Complete Complete

## 2024-04-17 NOTE — Evaluation (Signed)
 Occupational Therapy Evaluation Patient Details Name: Wayne Gordon MRN: [id number] DOB: 1933-09-03 Today's Date: 04/17/2024   History of Present Illness   Wayne Gordon is a 89 year old male with HFrEF, ischemic cardiomyopathy, LVEF 20-25%, status post ICD, pacer placement, stage IV CKD, atrial fibrillation/atrial flutter on chronic anticoagulation with apixaban , vertigo, osteoarthritis, type 2 diabetes mellitus on insulin , with renal complications, hyperlipidemia, hypertension, coronary artery disease, status post mitral valve repair, recent hospitalizations, recently discharged 04/01/2024 with a discharge weight of 234 pounds at that time.  EP had performed a cardioversion on 8/4 for symptomatic atrial flutter.  Since hospital discharge, his weight has steadily climbed despite taking oral diuretics at home.  He is now up to 247 pounds.  He is increasingly becoming weaker.  He saw cardiology in the outpatient setting recently and was taken off amiodarone .  He was brought into the emergency department because he continued to have swelling and weight gain in his legs.  He complained of severe fatigue and weakness and was unable to get out of bed this morning because he was so weak.  He was advised by the cardiology office to call EMS and come to the ED.  He was noted to be severely deconditioned and frail with significant peripheral edema, elevated BNP of 681, troponin 25, potassium 3.4, creatinine 2.75 and request was made for inpatient hospitalization for IV diuresis as he is failing outpatient diuresis with oral medication likely due to advanced renal failure. (per MD)     Clinical Impressions Pt agreeable to OT and PT co-evaluation. Pt very weak with labored movement today. Mod A for bed mobility and EOB to chair transfer with RW. Mod to max A for lower body dressing based on difficulty doffing and donning socks seated at EOB today. B UE generally weak with mild A/ROM deficits at the shoulder. Pt left  in the chair with call bell within reach. Pt will benefit from continued OT in the hospital and recommended venue below to increase strength, balance, and endurance for safe ADL's.        If plan is discharge home, recommend the following:   A lot of help with walking and/or transfers;A lot of help with bathing/dressing/bathroom;Assistance with cooking/housework;Assist for transportation;Help with stairs or ramp for entrance     Functional Status Assessment   Patient has had a recent decline in their functional status and demonstrates the ability to make significant improvements in function in a reasonable and predictable amount of time.     Equipment Recommendations   None recommended by OT             Precautions/Restrictions   Precautions Precautions: Fall Recall of Precautions/Restrictions: Intact     Mobility Bed Mobility Overal bed mobility: Needs Assistance Bed Mobility: Supine to Sit     Supine to sit: HOB elevated, Mod assist     General bed mobility comments: B LE tactile cuing to move to EOB; labored movement.    Transfers Overall transfer level: Needs assistance Equipment used: Rolling walker (2 wheels) Transfers: Sit to/from Stand, Bed to chair/wheelchair/BSC Sit to Stand: Mod assist     Step pivot transfers: Mod assist     General transfer comment: Slow labored movement; assist for sit to stand and steps to chair.      Balance Overall balance assessment: Needs assistance Sitting-balance support: No upper extremity supported, Feet supported Sitting balance-Leahy Scale: Poor Sitting balance - Comments: fair/poor Seated EOB, pt loses balance to L side during lower body  dressing attempt.   Standing balance support: During functional activity, Reliant on assistive device for balance, Bilateral upper extremity supported Standing balance-Leahy Scale: Poor Standing balance comment: Poor/fair with RW                            ADL either performed or assessed with clinical judgement   ADL Overall ADL's : Needs assistance/impaired     Grooming: Sitting;Minimal assistance   Upper Body Bathing: Minimal assistance;Sitting   Lower Body Bathing: Moderate assistance;Maximal assistance;Sitting/lateral leans   Upper Body Dressing : Minimal assistance;Sitting   Lower Body Dressing: Moderate assistance;Maximal assistance;Sitting/lateral leans   Toilet Transfer: Moderate assistance Toilet Transfer Details (indicate cue type and reason): EOB to chair with RW Toileting- Clothing Manipulation and Hygiene: Moderate assistance;Maximal assistance;Sitting/lateral lean       Functional mobility during ADLs: Moderate assistance;Rolling walker (2 wheels) General ADL Comments: Pt able to take a couple steps away from chair and back with RW.     Vision Baseline Vision/History:  (reports vertigo causes visual changes at times) Ability to See in Adequate Light: 1 Impaired Patient Visual Report: No change from baseline Vision Assessment?:  (baseline vertigo that alters vision at times)     Perception Perception: Not tested       Praxis Praxis: Not tested       Pertinent Vitals/Pain Pain Assessment Pain Assessment: Faces Faces Pain Scale: Hurts little more Pain Location: general with mobility Pain Descriptors / Indicators: Discomfort Pain Intervention(s): Limited activity within patient's tolerance, Monitored during session, Repositioned     Extremity/Trunk Assessment Upper Extremity Assessment Upper Extremity Assessment: Generalized weakness (3-/5 shoulder flexion bilaterally; generally weak)   Lower Extremity Assessment Lower Extremity Assessment: Defer to PT evaluation   Cervical / Trunk Assessment Cervical / Trunk Assessment: Kyphotic   Communication Communication Communication: No apparent difficulties   Cognition Arousal: Alert Behavior During Therapy: WFL for tasks assessed/performed Cognition:  No apparent impairments                               Following commands: Intact       Cueing  General Comments   Cueing Techniques: Verbal cues;Visual cues;Tactile cues                 Home Living Family/patient expects to be discharged to:: Private residence Living Arrangements: Spouse/significant other;Children Available Help at Discharge: Family;Available 24 hours/day Type of Home: House Home Access: Ramped entrance     Home Layout: One level     Bathroom Shower/Tub: Tub/shower unit;Walk-in shower   Bathroom Toilet: Handicapped height Bathroom Accessibility: Yes   Home Equipment: Cane - single point;Shower seat;Grab bars - tub/shower;Rolling Walker (2 wheels)   Additional Comments: Pt reports no change in prior living history.      Prior Functioning/Environment Prior Level of Function : Needs assist       Physical Assist : ADLs (physical)   ADLs (physical): IADLs Mobility Comments: Pt reports he's now using RW in home. Not much community ambulation. (per PT) ADLs Comments: Independent ADL's; assist IADL's by son.    OT Problem List: Decreased strength;Decreased range of motion;Decreased activity tolerance;Impaired balance (sitting and/or standing)   OT Treatment/Interventions: Self-care/ADL training;Therapeutic exercise;DME and/or AE instruction;Therapeutic activities;Patient/family education;Balance training;Energy conservation      OT Goals(Current goals can be found in the care plan section)   Acute Rehab OT Goals Patient Stated Goal: improve function  OT Goal Formulation: With patient Time For Goal Achievement: 05/01/24 Potential to Achieve Goals: Good   OT Frequency:  Min 3X/week    Co-evaluation PT/OT/SLP Co-Evaluation/Treatment: Yes Reason for Co-Treatment: To address functional/ADL transfers PT goals addressed during session: Mobility/safety with mobility OT goals addressed during session: ADL's and self-care                        End of Session Equipment Utilized During Treatment: Rolling walker (2 wheels);Gait belt  Activity Tolerance: Patient tolerated treatment well Patient left: in chair;with call bell/phone within reach  OT Visit Diagnosis: Unsteadiness on feet (R26.81);Other abnormalities of gait and mobility (R26.89);Muscle weakness (generalized) (M62.81);Adult, failure to thrive (R62.7)                Time: 9157-9140 OT Time Calculation (min): 17 min Charges:  OT General Charges $OT Visit: 1 Visit OT Evaluation $OT Eval Low Complexity: 1 Low  Larine Fielding OT, MOT   Jayson Person 04/17/2024, 10:36 AM

## 2024-04-17 NOTE — Progress Notes (Signed)
 Patients CBG 57 this morning, given juice and will recheck

## 2024-04-18 ENCOUNTER — Encounter (HOSPITAL_COMMUNITY): Payer: Self-pay | Admitting: Family Medicine

## 2024-04-18 DIAGNOSIS — Z95810 Presence of automatic (implantable) cardiac defibrillator: Secondary | ICD-10-CM | POA: Diagnosis not present

## 2024-04-18 DIAGNOSIS — N184 Chronic kidney disease, stage 4 (severe): Secondary | ICD-10-CM | POA: Diagnosis not present

## 2024-04-18 DIAGNOSIS — N178 Other acute kidney failure: Secondary | ICD-10-CM | POA: Diagnosis not present

## 2024-04-18 DIAGNOSIS — I4819 Other persistent atrial fibrillation: Secondary | ICD-10-CM | POA: Diagnosis not present

## 2024-04-18 DIAGNOSIS — I25810 Atherosclerosis of coronary artery bypass graft(s) without angina pectoris: Secondary | ICD-10-CM | POA: Diagnosis not present

## 2024-04-18 DIAGNOSIS — I5043 Acute on chronic combined systolic (congestive) and diastolic (congestive) heart failure: Secondary | ICD-10-CM | POA: Diagnosis not present

## 2024-04-18 LAB — BASIC METABOLIC PANEL WITH GFR
Anion gap: 11 (ref 5–15)
BUN: 42 mg/dL — ABNORMAL HIGH (ref 8–23)
CO2: 26 mmol/L (ref 22–32)
Calcium: 8.4 mg/dL — ABNORMAL LOW (ref 8.9–10.3)
Chloride: 101 mmol/L (ref 98–111)
Creatinine, Ser: 2.68 mg/dL — ABNORMAL HIGH (ref 0.61–1.24)
GFR, Estimated: 22 mL/min — ABNORMAL LOW (ref >60)
Glucose, Bld: 160 mg/dL — ABNORMAL HIGH (ref 70–99)
Potassium: 4.2 mmol/L (ref 3.5–5.1)
Sodium: 138 mmol/L (ref 135–145)

## 2024-04-18 LAB — TSH: TSH: 4.368 u[IU]/mL (ref 0.350–4.500)

## 2024-04-18 LAB — CBC WITH DIFFERENTIAL/PLATELET
Abs Immature Granulocytes: 0.02 K/uL (ref 0.00–0.07)
Basophils Absolute: 0 K/uL (ref 0.0–0.1)
Basophils Relative: 0 %
Eosinophils Absolute: 0.1 K/uL (ref 0.0–0.5)
Eosinophils Relative: 1 %
HCT: 41 % (ref 39.0–52.0)
Hemoglobin: 12.8 g/dL — ABNORMAL LOW (ref 13.0–17.0)
Immature Granulocytes: 0 %
Lymphocytes Relative: 12 %
Lymphs Abs: 1.3 K/uL (ref 0.7–4.0)
MCH: 28.8 pg (ref 26.0–34.0)
MCHC: 31.2 g/dL (ref 30.0–36.0)
MCV: 92.1 fL (ref 80.0–100.0)
Monocytes Absolute: 1.1 K/uL — ABNORMAL HIGH (ref 0.1–1.0)
Monocytes Relative: 10 %
Neutro Abs: 8.6 K/uL — ABNORMAL HIGH (ref 1.7–7.7)
Neutrophils Relative %: 77 %
Platelets: 172 K/uL (ref 150–400)
RBC: 4.45 MIL/uL (ref 4.22–5.81)
RDW: 15.5 % (ref 11.5–15.5)
WBC: 11.1 K/uL — ABNORMAL HIGH (ref 4.0–10.5)
nRBC: 0 % (ref 0.0–0.2)

## 2024-04-18 LAB — GLUCOSE, CAPILLARY
Glucose-Capillary: 162 mg/dL — ABNORMAL HIGH (ref 70–99)
Glucose-Capillary: 140 mg/dL — ABNORMAL HIGH (ref 70–99)
Glucose-Capillary: 181 mg/dL — ABNORMAL HIGH (ref 70–99)
Glucose-Capillary: 144 mg/dL — ABNORMAL HIGH (ref 70–99)
Glucose-Capillary: 190 mg/dL — ABNORMAL HIGH (ref 70–99)

## 2024-04-18 LAB — FOLATE: Folate: 19.7 ng/mL (ref >5.9)

## 2024-04-18 LAB — MAGNESIUM: Magnesium: 2.4 mg/dL (ref 1.7–2.4)

## 2024-04-18 LAB — CK: Total CK: 509 U/L — ABNORMAL HIGH (ref 49–397)

## 2024-04-18 LAB — VITAMIN B12: Vitamin B-12: 1326 pg/mL — ABNORMAL HIGH (ref 180–914)

## 2024-04-18 LAB — PROCALCITONIN: Procalcitonin: 0.68 ng/mL

## 2024-04-18 LAB — T4, FREE: Free T4: 2.02 ng/dL — ABNORMAL HIGH (ref 0.61–1.12)

## 2024-04-18 MED ORDER — FUROSEMIDE 10 MG/ML IJ SOLN
80.0000 mg | Freq: Three times a day (TID) | INTRAMUSCULAR | Status: DC
  Administered 2024-04-18 – 2024-04-21 (×9): 80 mg via INTRAVENOUS
  Filled 2024-04-18 (×10): qty 8

## 2024-04-18 MED ORDER — ARFORMOTEROL TARTRATE 15 MCG/2ML IN NEBU
15.0000 ug | INHALATION_SOLUTION | Freq: Two times a day (BID) | RESPIRATORY_TRACT | Status: DC
  Administered 2024-04-18 – 2024-04-25 (×14): 15 ug via RESPIRATORY_TRACT
  Filled 2024-04-18 (×14): qty 2

## 2024-04-18 MED ORDER — BUDESONIDE 0.5 MG/2ML IN SUSP
0.5000 mg | Freq: Two times a day (BID) | RESPIRATORY_TRACT | Status: DC
  Administered 2024-04-18 – 2024-04-25 (×14): 0.5 mg via RESPIRATORY_TRACT
  Filled 2024-04-18 (×14): qty 2

## 2024-04-18 NOTE — Progress Notes (Signed)
 Progress Note  Patient Name: Wayne Gordon Date of Encounter: 04/18/2024  Primary Cardiologist: Elyan Vanwieren P Kortlyn Koltz, MD  Subjective   SOB improved but he still has long ways to go.  Inpatient Medications    Scheduled Meds:  apixaban   2.5 mg Oral BID   carvedilol   12.5 mg Oral BID WC   dapagliflozin  propanediol  10 mg Oral QHS   furosemide   80 mg Intravenous BID   gabapentin   100 mg Oral QHS   insulin  aspart  0-9 Units Subcutaneous TID WC   insulin  glargine-yfgn  8 Units Subcutaneous QHS   pantoprazole   40 mg Oral QAC breakfast   potassium chloride   40 mEq Oral BID   sodium chloride  flush  3 mL Intravenous Q12H   Continuous Infusions:  PRN Meds: acetaminophen , guaiFENesin , ondansetron  (ZOFRAN ) IV, sodium chloride  flush   Vital Signs    Vitals:   04/17/24 1246 04/17/24 2008 04/18/24 0430 04/18/24 0500  BP: 116/70 (!) 115/94  (!) 105/49  Pulse: 71 80  69  Resp: 19   20  Temp: 98.2 F (36.8 C) 98.8 F (37.1 C)  97.8 F (36.6 C)  TempSrc: Oral Oral  Oral  SpO2: 93% 94%  96%  Weight:   115.5 kg   Height:        Intake/Output Summary (Last 24 hours) at 04/18/2024 1047 Last data filed at 04/18/2024 0459 Gross per 24 hour  Intake --  Output 2000 ml  Net -2000 ml   Filed Weights   04/16/24 1008 04/17/24 0400 04/18/24 0430  Weight: 112 kg 111.1 kg 115.5 kg    Telemetry     Personally reviewed.  ECG    Not performed today.  Physical Exam   GEN: No acute distress.   Neck: JVD+. Cardiac: RRR, no murmur, rub, or gallop.  Respiratory: Nonlabored. Clear to auscultation bilaterally. GI: Soft, nontender, bowel sounds present. MS: Minimal pitting edema; No deformity. Neuro:  Nonfocal. Psych: Alert and oriented x 3. Normal affect.  Labs    Chemistry Recent Labs  Lab 04/16/24 1029 04/17/24 0358 04/18/24 0410  NA 136 139 138  K 3.4* 3.8 4.2  CL 99 102 101  CO2 26 24 26   GLUCOSE 130* 74 160*  BUN 36* 37* 42*  CREATININE 2.75* 2.60* 2.68*   CALCIUM  8.2* 8.4* 8.4*  PROT 6.1*  --   --   ALBUMIN  3.2*  --   --   AST 25  --   --   ALT 20  --   --   ALKPHOS 75  --   --   BILITOT 1.2  --   --   GFRNONAA 22* 23* 22*  ANIONGAP 11 13 11      Hematology Recent Labs  Lab 04/16/24 1029 04/17/24 0358 04/18/24 0410  WBC 10.8* 12.6* 11.1*  RBC 4.45 4.60 4.45  HGB 13.1 13.5 12.8*  HCT 41.2 42.4 41.0  MCV 92.6 92.2 92.1  MCH 29.4 29.3 28.8  MCHC 31.8 31.8 31.2  RDW 15.3 15.3 15.5  PLT 206 203 172    Cardiac Enzymes Recent Labs  Lab 03/29/24 1120 03/29/24 1302 04/16/24 1029 04/16/24 1155  TROPONINIHS 19* 19* 25* 25*    BNP Recent Labs  Lab 04/16/24 1029 04/17/24 0358  BNP 681.0* 927.0*     DDimerNo results for input(s): DDIMER in the last 168 hours.   Radiology    DG Chest Portable 1 View Result Date: 04/16/2024 CLINICAL DATA:  Weakness. EXAM: PORTABLE CHEST 1 VIEW  COMPARISON:  March 29, 2024. FINDINGS: Stable cardiomegaly. Sternotomy wires are noted. Left-sided defibrillator is unchanged. Elevated right hemidiaphragm is noted. Left lung base is not completely included in field-of-view. Visualized lung parenchyma is unremarkable. Bony thorax is unremarkable. IMPRESSION: Left lung base is not completely included in field-of-view. No definite abnormality seen in the visualized lung parenchyma. Electronically Signed   By: Lynwood Landy Raddle M.D.   On: 04/16/2024 12:06    Assessment & Plan   Acute on chronic combined systolic and diastolic heart failure: Presented with 13 pound weight gain and leg swelling.  Not much active at home, he was not able to complete sentences due to SOB.  BNP 927.  He was initially on IV Lasix  40 mg twice daily that was increased to 80 mg twice daily, made 2L urine output with net -2 L in the last 24 hours.  He reported that his breathing improved but he is still visibly tachypneic and not able to complete sentences due to shortness of breath.  Increase IV Lasix  from 80 mg BID to TID.   Continue carvedilol  12.5 mg twice daily, Farxiga  10 mg once daily.  Not on ACE/ARB/MRA/ARNI due to CKD stage IIIb-IV.   CAD s/p CABG: No angina.  Continue rosuvastatin  5 mg nightly.  Not on aspirin  due to Eliquis  use.   Persistent A-fib s/p DCCV in August 2025: EKG showed ventricular paced rhythm.  Continue carvedilol  12.5 mg twice daily and Eliquis  2.5 mg twice daily.  Previously on amiodarone  however had to be discontinued due to worsening dizziness.   S/p CRT-D: Outpatient EP follow-up.  Signed, Diannah SHAUNNA Maywood, MD  04/18/2024, 10:47 AM

## 2024-04-18 NOTE — Plan of Care (Signed)
   Problem: Education: Goal: Knowledge of General Education information will improve Description: Including pain rating scale, medication(s)/side effects and non-pharmacologic comfort measures Outcome: Progressing   Problem: Clinical Measurements: Goal: Ability to maintain clinical measurements within normal limits will improve Outcome: Progressing Goal: Diagnostic test results will improve Outcome: Progressing

## 2024-04-18 NOTE — Progress Notes (Signed)
 PROGRESS NOTE  Wayne Gordon DOB: 28-Apr-1934 DOA: 04/16/2024 PCP: Renato Dorothey HERO, NP  Brief History:  89 year old male with HFrEF, CAD status post CABG and mitral valve repair 2013, LVEF 20-25%, status post ICD, pacer placement, stage IV CKD, persistent atrial fibrillation/atrial flutter on chronic anticoagulation with apixaban  status post DCCV 84/25, ype 2 diabetes mellitus on insulin , with renal complications, hyperlipidemia, hypertension, coronary artery disease, status post mitral valve repair, recent hospitalizations, recently discharged 04/01/2024 with a discharge weight of 234 pounds at that time.  EP had performed a cardioversion on 8/4 for symptomatic atrial flutter.  The patient was admitted to the hospital from 03/29/2024 to 04/01/2024 for acute on chronic HFrEF.   He was discharged home on furosemide  40 mg p.o. daily.  Since hospital discharge, his weight has steadily climbed despite taking oral diuretics at home.  He is now up to 247 pounds.  He is increasingly becoming weaker.  He saw cardiology in the outpatient setting recently and was taken off amiodarone  04/05/24 due to patient report of arm and leg weakness.  He was brought into the emergency department because he continued to have swelling and weight gain in his legs and worsening shortness of breath.  He complained of severe fatigue and weakness and was unable to get out of bed this morning because he was so weak.  He was advised by the cardiology office to call EMS and come to the ED.  He was noted to be severely deconditioned and frail with significant peripheral edema, elevated BNP of 681, troponin 25, potassium 3.4, creatinine 2.75 and request was made for inpatient hospitalization for IV diuresis as he is failing outpatient diuresis with oral medication likely due to advanced renal failure.   Assessment/Plan: Acute on chronic HFrEF - 03/30/2024 echo EF 20-25%, global HK, mild decreased RVF, s/p MV  annuloplasty - Remains clinically fluid overloaded - Continue IV furosemide --increased to 80 mg IV  TID - Cardiology consult appreciated - Daily weights--?accuracy - Accurate I's and O's--NEG 2L - 04/01/2024 discharge weight 234 lbs - Admission weight 247 lbs   Persistent atrial fibrillation - Status post DCCV 04/01/2024 - Continue carvedilol  - Continue apixaban  - Previously on amiodarone , but discontinued secondary to dizziness and generalized weakness   Acute on chronic CKD stage IV - Baseline creatinine 1.9-2.1 - Presented with serum creatinine 2.75 - Monitor with diuresis - Secondary to cardiorenal syndrome   Coronary artery disease /elevated troponin - Status post CABG -Secondary to demand ischemia - No angina - holding  Crestor  temporarily due to elevated CK - Currently on apixaban    Diabetes mellitus type 2, uncontrolled with hyperglycemia - 03/29/2024 hemoglobin A1c 7.7 - Continue NovoLog  sliding scale - Continue Semglee  reduced dose - He is on Toujeo  26 units daily at home   Generalized weakness - Secondary to decompensated heart failure and electrolyte derangements and AKI - B12--1326 - Folic acid--19.7 - TSH--4.368 - PT evaluation>>SNF - UA no pyuria - CK 509>>trend   Hypokalemia - Replete - Check magnesium --2.4   Mixed hyperlipidemia - holding statin temporarily due to elevated CK   Sinus node dysfunction s/p ICD 2013 with upgrade of Biotronik BiV ICD 09/2020 - Device interrogation 02/2024: AP/VP rhythm with no treated arrhythmias - Continue to follow with EP         Family Communication:   spouse/daughter at bedside 8/21  Consultants:  cardiology  Code Status:  FULL   DVT Prophylaxis:  apixaban   Procedures: As Listed in Progress Note Above  Antibiotics: None    Subjective: Pt states he is feeling a bit stronger in his arms/legs.  Remains sob, but still sob.  Denies cp, n/v/d, headache.  Objective: Vitals:   04/17/24 2008  04/18/24 0430 04/18/24 0500 04/18/24 1354  BP: (!) 115/94  (!) 105/49 107/62  Pulse: 80  69 83  Resp:   20 18  Temp: 98.8 F (37.1 C)  97.8 F (36.6 C) 98.4 F (36.9 C)  TempSrc: Oral  Oral Oral  SpO2: 94%  96% 96%  Weight:  115.5 kg    Height:        Intake/Output Summary (Last 24 hours) at 04/18/2024 1621 Last data filed at 04/18/2024 1354 Gross per 24 hour  Intake --  Output 2600 ml  Net -2600 ml   Weight change: 3.5 kg Exam:  General:  Pt is alert, follows commands appropriately, not in acute distress HEENT: No icterus, No thrush, No neck mass, [place]/AT Cardiovascular: RRR, S1/S2, no rubs, no gallops Respiratory: bilateral rales Abdomen: Soft/+BS, non tender, non distended, no guarding Extremities: 2 + LE edema, No lymphangitis, No petechiae, No rashes, no synovitis   Data Reviewed: I have personally reviewed following labs and imaging studies Basic Metabolic Panel: Recent Labs  Lab 04/16/24 1029 04/17/24 0358 04/18/24 0410  NA 136 139 138  K 3.4* 3.8 4.2  CL 99 102 101  CO2 26 24 26   GLUCOSE 130* 74 160*  BUN 36* 37* 42*  CREATININE 2.75* 2.60* 2.68*  CALCIUM  8.2* 8.4* 8.4*  MG  --  2.4 2.4   Liver Function Tests: Recent Labs  Lab 04/16/24 1029  AST 25  ALT 20  ALKPHOS 75  BILITOT 1.2  PROT 6.1*  ALBUMIN  3.2*   No results for input(s): LIPASE, AMYLASE in the last 168 hours. No results for input(s): AMMONIA in the last 168 hours. Coagulation Profile: No results for input(s): INR, PROTIME in the last 168 hours. CBC: Recent Labs  Lab 04/16/24 1029 04/17/24 0358 04/18/24 0410  WBC 10.8* 12.6* 11.1*  NEUTROABS 8.4* 9.5* 8.6*  HGB 13.1 13.5 12.8*  HCT 41.2 42.4 41.0  MCV 92.6 92.2 92.1  PLT 206 203 172   Cardiac Enzymes: Recent Labs  Lab 04/18/24 0410  CKTOTAL 509*   BNP: Invalid input(s): POCBNP CBG: Recent Labs  Lab 04/17/24 2140 04/18/24 0304 04/18/24 0738 04/18/24 1115 04/18/24 1613  GLUCAP 234* 162* 140* 181*  144*   HbA1C: No results for input(s): HGBA1C in the last 72 hours. Urine analysis:    Component Value Date/Time   COLORURINE YELLOW 04/16/2024 1104   APPEARANCEUR CLEAR 04/16/2024 1104   LABSPEC 1.014 04/16/2024 1104   PHURINE 5.0 04/16/2024 1104   GLUCOSEU >=500 (A) 04/16/2024 1104   HGBUR NEGATIVE 04/16/2024 1104   BILIRUBINUR NEGATIVE 04/16/2024 1104   KETONESUR NEGATIVE 04/16/2024 1104   PROTEINUR NEGATIVE 04/16/2024 1104   UROBILINOGEN 0.2 02/04/2014 0939   NITRITE NEGATIVE 04/16/2024 1104   LEUKOCYTESUR NEGATIVE 04/16/2024 1104   Sepsis Labs: @LABRCNTIP (procalcitonin:4,lacticidven:4) ) Recent Results (from the past 240 hours)  Resp panel by RT-PCR (RSV, Flu A&B, Covid) Anterior Nasal Swab     Status: None   Collection Time: 04/16/24 11:04 AM   Specimen: Anterior Nasal Swab  Result Value Ref Range Status   SARS Coronavirus 2 by RT PCR NEGATIVE NEGATIVE Final    Comment: (NOTE) SARS-CoV-2 target nucleic acids are NOT DETECTED.  The SARS-CoV-2 RNA is generally detectable in upper respiratory  specimens during the acute phase of infection. The lowest concentration of SARS-CoV-2 viral copies this assay can detect is 138 copies/mL. A negative result does not preclude SARS-Cov-2 infection and should not be used as the sole basis for treatment or other patient management decisions. A negative result may occur with  improper specimen collection/handling, submission of specimen other than nasopharyngeal swab, presence of viral mutation(s) within the areas targeted by this assay, and inadequate number of viral copies(<138 copies/mL). A negative result must be combined with clinical observations, patient history, and epidemiological information. The expected result is Negative.  Fact Sheet for Patients:  BloggerCourse.com  Fact Sheet for Healthcare Providers:  SeriousBroker.it  This test is no t yet approved or cleared by  the [place]  FDA and  has been authorized for detection and/or diagnosis of SARS-CoV-2 by FDA under an Emergency Use Authorization (EUA). This EUA will remain  in effect (meaning this test can be used) for the duration of the COVID-19 declaration under Section 564(b)(1) of the Act, 21 U.S.C.section 360bbb-3(b)(1), unless the authorization is terminated  or revoked sooner.       Influenza A by PCR NEGATIVE NEGATIVE Final   Influenza B by PCR NEGATIVE NEGATIVE Final    Comment: (NOTE) The Xpert Xpress SARS-CoV-2/FLU/RSV plus assay is intended as an aid in the diagnosis of influenza from Nasopharyngeal swab specimens and should not be used as a sole basis for treatment. Nasal washings and aspirates are unacceptable for Xpert Xpress SARS-CoV-2/FLU/RSV testing.  Fact Sheet for Patients: BloggerCourse.com  Fact Sheet for Healthcare Providers: SeriousBroker.it  This test is not yet approved or cleared by the [place]  FDA and has been authorized for detection and/or diagnosis of SARS-CoV-2 by FDA under an Emergency Use Authorization (EUA). This EUA will remain in effect (meaning this test can be used) for the duration of the COVID-19 declaration under Section 564(b)(1) of the Act, 21 U.S.C. section 360bbb-3(b)(1), unless the authorization is terminated or revoked.     Resp Syncytial Virus by PCR NEGATIVE NEGATIVE Final    Comment: (NOTE) Fact Sheet for Patients: BloggerCourse.com  Fact Sheet for Healthcare Providers: SeriousBroker.it  This test is not yet approved or cleared by the [place]  FDA and has been authorized for detection and/or diagnosis of SARS-CoV-2 by FDA under an Emergency Use Authorization (EUA). This EUA will remain in effect (meaning this test can be used) for the duration of the COVID-19 declaration under Section 564(b)(1) of the Act, 21  U.S.C. section 360bbb-3(b)(1), unless the authorization is terminated or revoked.  Performed at [facility], [address], [place], [place] [address]      Scheduled Meds:  apixaban   2.5 mg Oral BID   carvedilol   12.5 mg Oral BID WC   dapagliflozin  propanediol  10 mg Oral QHS   furosemide   80 mg Intravenous TID   gabapentin   100 mg Oral QHS   insulin  aspart  0-9 Units Subcutaneous TID WC   insulin  glargine-yfgn  8 Units Subcutaneous QHS   pantoprazole   40 mg Oral QAC breakfast   potassium chloride   40 mEq Oral BID   sodium chloride  flush  3 mL Intravenous Q12H   Continuous Infusions:  Procedures/Studies: DG Chest Portable 1 View Result Date: 04/16/2024 CLINICAL DATA:  Weakness. EXAM: PORTABLE CHEST 1 VIEW COMPARISON:  March 29, 2024. FINDINGS: Stable cardiomegaly. Sternotomy wires are noted. Left-sided defibrillator is unchanged. Elevated right hemidiaphragm is noted. Left lung base is not completely included in field-of-view. Visualized lung parenchyma is unremarkable.  Bony thorax is unremarkable. IMPRESSION: Left lung base is not completely included in field-of-view. No definite abnormality seen in the visualized lung parenchyma. Electronically Signed   By: Lynwood Landy Raddle M.D.   On: 04/16/2024 12:06   EP STUDY Result Date: 04/01/2024 See surgical note for result.  ECHOCARDIOGRAM COMPLETE Result Date: 03/30/2024    ECHOCARDIOGRAM REPORT   Patient Name:   Wayne Gordon Date of Exam: 03/30/2024 Medical Rec #:  [id number]      Height:       76.0 in Accession #:    [id number]     Weight:       242.9 lb Date of Birth:  1933/11/28       BSA:          2.406 m Patient Age:    89 years       BP:           106/66 mmHg Patient Gender: M              HR:           80 bpm. Exam Location:  Inpatient Procedure: 2D Echo, Cardiac Doppler, Color Doppler and Intracardiac            Opacification Agent (Both Spectral and Color Flow Doppler were            utilized during procedure). Indications:    I50.21  Acute systolic (congestive) heart failure  History:        Patient has prior history of Echocardiogram examinations, most                 recent 10/15/2021. CHF, CAD, Prior CABG and Defibrillator,                 Arrythmias:Atrial Fibrillation; Risk Factors:Hypertension and                 Diabetes.  Sonographer:    Damien Senior RDCS Referring Phys: [phone] JACOB J STINSON IMPRESSIONS  1. No LV thrombus by Definity . Left ventricular ejection fraction, by estimation, is 20 to 25%. The left ventricle has severely decreased function. The left ventricle demonstrates global hypokinesis. The left ventricular internal cavity size was mildly dilated. There is mild concentric left ventricular hypertrophy. Left ventricular diastolic parameters are indeterminate. There is the interventricular septum is flattened in systole and diastole, consistent with right ventricular pressure and volume overload.  2. Right ventricular systolic function is mildly reduced. The right ventricular size is moderately enlarged. There is normal pulmonary artery systolic pressure. The estimated right ventricular systolic pressure is 32.8 mmHg.  3. Left atrial size was mildly dilated.  4. S/p Sorin Memo 3D ring annuloplasty (size 30mm, Catalog #[id number], serial [id number]). The mitral valve has been repaired/replaced. Mild mitral valve regurgitation. No evidence of mitral stenosis. The mean mitral valve gradient is 3.0 mmHg with average heart rate of 85 bpm. Procedure Date: 10/17/11. Echo findings are consistent with normal structure and function of the mitral valve prosthesis.  5. The tricuspid valve is abnormal. Tricuspid valve regurgitation is moderate.  6. The aortic valve is tricuspid. There is moderate calcification of the aortic valve. There is moderate thickening of the aortic valve. Aortic valve regurgitation is trivial. Aortic valve sclerosis/calcification is present, without any evidence of aortic stenosis.  7. The inferior vena cava is normal in  size with <50% respiratory variability, suggesting right atrial pressure of 8 mmHg. FINDINGS  Left Ventricle: No LV thrombus by Definity . Left ventricular ejection fraction,  by estimation, is 20 to 25%. The left ventricle has severely decreased function. The left ventricle demonstrates global hypokinesis. Definity  contrast agent was given IV to delineate the left ventricular endocardial borders. Strain was performed and the global longitudinal strain is indeterminate. The left ventricular internal cavity size was mildly dilated. There is mild concentric left ventricular hypertrophy. The interventricular septum is flattened in systole and diastole, consistent with right ventricular pressure and volume overload. Left ventricular diastolic function could not be evaluated due to mitral valve repair. Left ventricular diastolic parameters are  indeterminate. Right Ventricle: The right ventricular size is moderately enlarged. No increase in right ventricular wall thickness. Right ventricular systolic function is mildly reduced. There is normal pulmonary artery systolic pressure. The tricuspid regurgitant velocity is 2.49 m/s, and with an assumed right atrial pressure of 8 mmHg, the estimated right ventricular systolic pressure is 32.8 mmHg. Left Atrium: Left atrial size was mildly dilated. Right Atrium: Right atrial size was normal in size. Pericardium: There is no evidence of pericardial effusion. Mitral Valve: S/p Sorin Memo 3D ring annuloplasty (size 30mm, Catalog #[id number], serial [id number]). The mitral valve has been repaired/replaced. Mild mitral valve regurgitation. There is a 30 mm Mitral Memo ring prosthetic annuloplasty ring present in the mitral position. Echo findings are consistent with normal structure and function of the mitral valve prosthesis. No evidence of mitral valve stenosis. MV peak gradient, 6.6 mmHg. The mean mitral valve gradient is 3.0 mmHg with average heart rate of 85 bpm. Tricuspid Valve: The  tricuspid valve is abnormal. Tricuspid valve regurgitation is moderate. Aortic Valve: The aortic valve is tricuspid. There is moderate calcification of the aortic valve. There is moderate thickening of the aortic valve. Aortic valve regurgitation is trivial. Aortic valve sclerosis/calcification is present, without any evidence of aortic stenosis. Pulmonic Valve: The pulmonic valve was not well visualized. Pulmonic valve regurgitation is trivial. Aorta: The aortic root and ascending aorta are structurally normal, with no evidence of dilitation. Venous: The inferior vena cava is normal in size with less than 50% respiratory variability, suggesting right atrial pressure of 8 mmHg. IAS/Shunts: No atrial level shunt detected by color flow Doppler. Additional Comments: 3D was performed not requiring image post processing on an independent workstation and was indeterminate. A device lead is visualized in the right atrium and right ventricle.  LEFT VENTRICLE PLAX 2D LVIDd:         5.10 cm LVIDs:         4.80 cm LV PW:         1.20 cm LV IVS:        1.20 cm LVOT diam:     2.00 cm LV SV:         33 LV SV Index:   14 LVOT Area:     3.14 cm  LEFT ATRIUM              Index        RIGHT ATRIUM           Index LA diam:        4.60 cm  1.91 cm/m   RA Area:     18.60 cm LA Vol (A2C):   107.0 ml 44.47 ml/m  RA Volume:   51.50 ml  21.40 ml/m LA Vol (A4C):   79.8 ml  33.17 ml/m LA Biplane Vol: 93.3 ml  38.78 ml/m  AORTIC VALVE LVOT Vmax:   54.30 cm/s LVOT Vmean:  37.400 cm/s LVOT VTI:    0.104 m  AORTA Ao Root diam: 3.30 cm Ao Asc diam:  3.70 cm MITRAL VALVE             TRICUSPID VALVE MV Area VTI:  1.34 cm   TR Peak grad:   24.8 mmHg MV Peak grad: 6.6 mmHg   TR Vmax:        249.00 cm/s MV Mean grad: 3.0 mmHg MV Vmax:      1.29 m/s   SHUNTS MV Vmean:     80.7 cm/s  Systemic VTI:  0.10 m                          Systemic Diam: 2.00 cm Vishnu Priya Mallipeddi Electronically signed by Diannah Late Mallipeddi Signature Date/Time:  03/30/2024/11:31:43 AM    Final    DG Chest Portable 1 View Result Date: 03/29/2024 CLINICAL DATA:  Weakness.  Dizziness. EXAM: PORTABLE CHEST 1 VIEW COMPARISON:  10/15/2021. FINDINGS: Low lung volume. Mild diffuse pulmonary vascular congestion, likely accentuated by low lung volume. No frank pulmonary edema. There are probable atelectatic changes at the lung bases. There is subtle blunting of bilateral lateral costophrenic angles, which may be due to trace pleural effusions versus pleural thickening. No pneumothorax on either side. Stable cardio-mediastinal silhouette. There are surgical staples along the heart border and sternotomy wires, status post CABG (coronary artery bypass graft). There is a left sided 3-lead pacemaker. Neurostimulator device noted overlying the lower thoracic spine region. No acute osseous abnormalities. The soft tissues are within normal limits. IMPRESSION: Mild diffuse pulmonary vascular congestion, likely accentuated by low lung volume. No frank pulmonary edema. Electronically Signed   By: Ree Molt M.D.   On: 03/29/2024 12:09    Alm Schneider, DO  [facility]  If 7PM-7AM, please contact night-coverage [email or web address] Password [email or web address] 04/18/2024, 4:21 PM   LOS: 2 days

## 2024-04-18 NOTE — Patient Instructions (Signed)

## 2024-04-18 NOTE — Progress Notes (Signed)
 Occupational Therapy Treatment Patient Details Name: Wayne Gordon MRN: [id number] DOB: 1934/07/05 Today's Date: 04/18/2024   History of present illness Wayne Gordon is a 89 year old male with HFrEF, ischemic cardiomyopathy, LVEF 20-25%, status post ICD, pacer placement, stage IV CKD, atrial fibrillation/atrial flutter on chronic anticoagulation with apixaban , vertigo, osteoarthritis, type 2 diabetes mellitus on insulin , with renal complications, hyperlipidemia, hypertension, coronary artery disease, status post mitral valve repair, recent hospitalizations, recently discharged 04/01/2024 with a discharge weight of 234 pounds at that time.  EP had performed a cardioversion on 8/4 for symptomatic atrial flutter.  Since hospital discharge, his weight has steadily climbed despite taking oral diuretics at home.  He is now up to 247 pounds.  He is increasingly becoming weaker.  He saw cardiology in the outpatient setting recently and was taken off amiodarone .  He was brought into the emergency department because he continued to have swelling and weight gain in his legs.  He complained of severe fatigue and weakness and was unable to get out of bed this morning because he was so weak.  He was advised by the cardiology office to call EMS and come to the ED.  He was noted to be severely deconditioned and frail with significant peripheral edema, elevated BNP of 681, troponin 25, potassium 3.4, creatinine 2.75 and request was made for inpatient hospitalization for IV diuresis as he is failing outpatient diuresis with oral medication likely due to advanced renal failure.   OT comments  Pt agreeable to OT treatment. Pt seated in the chair to start the session. Pt demonstrated improved mobility with min to mod A for sit to stand reps, but more CGA for ambulation with ability to stand at the sink for over a minute to complete grooming. Cuing needed to improve posture while washing hands. Pt also able to doff and don a sock  today without physical assist while seated in the recliner. Pt fatigued during  B UE strengthening and required rest breaks after 5 reps for most exercises. Pt left in the chair with call bell within reach. Pt will benefit from continued OT in the hospital and recommended venue below to increase strength, balance, and endurance for safe ADL's.         If plan is discharge home, recommend the following:  A lot of help with walking and/or transfers;Assistance with cooking/housework;Assist for transportation;Help with stairs or ramp for entrance;A little help with bathing/dressing/bathroom   Equipment Recommendations  None recommended by OT          Precautions / Restrictions Precautions Precautions: Fall Recall of Precautions/Restrictions: Intact Restrictions Weight Bearing Restrictions Per Provider Order: No       Mobility Bed Mobility               General bed mobility comments: pt seated in the chair to start the session    Transfers Overall transfer level: Needs assistance Equipment used: Rolling walker (2 wheels) Transfers: Sit to/from Stand Sit to Stand: Min assist, Mod assist, Contact guard assist           General transfer comment: Pt requried min to mod A for majority of sit to stands from the chair. Pt completed 5 consecutive sit to stand with min to mod A using RW. Pt's last sit to stand before walking to the sink was more CGA.     Balance Overall balance assessment: Needs assistance Sitting-balance support: Feet supported, No upper extremity supported Sitting balance-Leahy Scale: Fair Sitting balance - Comments: fair  to good in the recliner.   Standing balance support: During functional activity, Reliant on assistive device for balance, Bilateral upper extremity supported Standing balance-Leahy Scale: Poor Standing balance comment: Poor/fair with RW                           ADL either performed or assessed with clinical judgement   ADL  Overall ADL's : Needs assistance/impaired     Grooming: Wash/dry hands;Wash/dry face;Standing;Contact guard assist Grooming Details (indicate cue type and reason): Able to stand at the sink for over a minute to wash hands and face. RW available but not actively used much of the standing.             Lower Body Dressing: Sitting/lateral leans;Set up;Minimal assistance Lower Body Dressing Details (indicate cue type and reason): Pt able to doff and don one sock today while seated in the chair without physical assist. More assist possibly needed for pants. Toilet Transfer: Minimal assistance;Moderate assistance;Ambulation;Rolling walker (2 wheels) Toilet Transfer Details (indicate cue type and reason): Simulated via ambulation to the sink and back to chair with RW.         Functional mobility during ADLs: Minimal assistance;Contact guard assist;Rolling walker (2 wheels) General ADL Comments: Able to ambulate to sink with RW; more CGA once standing. Assist needed mostly to boost from chair.     Communication Communication Communication: No apparent difficulties   Cognition Arousal: Alert Behavior During Therapy: WFL for tasks assessed/performed Cognition: No apparent impairments                               Following commands: Intact        Cueing   Cueing Techniques: Verbal cues, Visual cues, Tactile cues  Exercises Exercises: General Upper Extremity General Exercises - Upper Extremity Shoulder Flexion: AROM, 10 reps, Seated, Both Shoulder ABduction: AROM, Both, 10 reps, Seated Shoulder Horizontal ABduction: AROM, 10 reps, Seated, Both (x10 protraction and external rotation as well. Pt required a rest after 5 reps for shoulder abduction, flexion,and horizontal abduction. Min A/tactile cuing for shoulder abduction as well.)                 Pertinent Vitals/ Pain       Pain Assessment Pain Assessment: No/denies pain                                                           Frequency  Min 3X/week        Progress Toward Goals  OT Goals(current goals can now be found in the care plan section)  Progress towards OT goals: Progressing toward goals  Acute Rehab OT Goals Patient Stated Goal: improve function OT Goal Formulation: With patient Time For Goal Achievement: 05/01/24 Potential to Achieve Goals: Good ADL Goals Pt Will Perform Grooming: with contact guard assist;standing Pt Will Perform Lower Body Bathing: with modified independence;sitting/lateral leans Pt Will Perform Upper Body Dressing: with modified independence;sitting Pt Will Perform Lower Body Dressing: with modified independence;sitting/lateral leans Pt Will Transfer to Toilet: with modified independence;ambulating Pt Will Perform Toileting - Clothing Manipulation and hygiene: with modified independence;sitting/lateral leans Pt/caregiver will Perform Home Exercise Program: Increased strength;Increased ROM;Both right and left upper extremity;Independently  Plan  End of Session Equipment Utilized During Treatment: Rolling walker (2 wheels);Gait belt;Oxygen   OT Visit Diagnosis: Unsteadiness on feet (R26.81);Other abnormalities of gait and mobility (R26.89);Muscle weakness (generalized) (M62.81);Adult, failure to thrive (R62.7)   Activity Tolerance Patient tolerated treatment well   Patient Left in chair;with call bell/phone within reach   Nurse Communication          Time: 9085-9059 OT Time Calculation (min): 26 min  Charges: OT General Charges $OT Visit: 1 Visit OT Treatments $Self Care/Home Management : 8-22 mins $Therapeutic Exercise: 8-22 mins  Zacharia Sowles OT, MOT  Jayson Person 04/18/2024, 11:42 AM

## 2024-04-18 NOTE — Progress Notes (Addendum)
 Heart Failure Nurse Navigator Progress Note  PCP: Renato Dorothey HERO, NP PCP-Cardiologist: Vishnu P Mallipeddi, MD Admission Diagnosis: CHF Exacerbation [facility]) Admitted from: Home via [facility]  [facility]: Heart Failure Education provided to patient remotely via telephone to room 304.  2 Patient identifiers confirmed with patient prior to interview.   Presentation:   Wayne Gordon presented with persistent weakness shortness of breath and leg swelling. Patients blood glucose was 69 with EMS and given 30G oral glucose with a blood glucose rise to 98.  Endorsed cough productive of yellow phlegm.  No fever , chills, chest pain, abdominal pain, falls/head trama or sore throat.  Patient with 13 pound weight gain this discharge and feeling of extra fluid. Decreased po intake and not urinating much even though he is still taking his Lasix .  Stated, I don't think my Lasix  is working anymore. Per note, outpatient diuresis with oral medication likely failing due to advanced renal failure. BNP 927. HS-Troponin 25. Chest x-ray: Stable Cardiomegaly.    Since patients last hospital admission on 03/29/24 at [facility] with recent adjustment to his ICD.  Had A-Flutter and cardioversion performed on 04/01/24 and was started on Amiodarone  and diuresed with IV Lasix .   Per notes while at [facility] on 04/01/24 had declined a Heart Failure TOC at that time. Since he had been readmitted to [facility] again he was open to a HF TOC now at [facility].  Completed HF education and scheduled a TOC this readmission on 04/30/24 @ 3:30 PM  ECHO/ LVEF: 20-25%   Clinical Course:  Past Medical History:  Diagnosis Date   Allergy to ACE inhibitors    Angioedema many years ago; patient has tolerated Losartan  (ARB) in the past - it was held during admisson for GI bleed and worsening renal function >> resume Losartan  25 mg QD 06/2015   Arthritis    Atrial fibrillation (HCC)    PAF, CHADs2Vasc = 5   Carotid artery  disease (HCC)    60-79% LICA   CHF (congestive heart failure) (HCC)    CKD (chronic kidney disease)    Coronary artery disease    Diabetes mellitus    GERD (gastroesophageal reflux disease)    Hyperlipidemia    Hypertension    ICD (implantable cardiac defibrillator) in place 03/28/2012   Biotronik, Dr. Waddell 03/28/12   Ischemic cardiomyopathy    S/P CABG x 3; EF 25%   Mitral regurgitation    S/P mitral valve repair 2013   Myocardial infarction [facility])    Renal insufficiency      Social History   Socioeconomic History   Marital status: Married    Spouse name: Not on file   Number of children: Not on file   Years of education: Not on file   Highest education level: Some college, no degree  Occupational History   Occupation: Retired  Tobacco Use   Smoking status: Never   Smokeless tobacco: Never  Vaping Use   Vaping status: Never Used  Substance and Sexual Activity   Alcohol use: No    Alcohol/week: 0.0 standard drinks of alcohol   Drug use: No   Sexual activity: Yes  Other Topics Concern   Not on file  Social History Narrative   Not on file   Social Drivers of Health   Financial Resource Strain: Medium Risk (09/05/2022)   Received from [facility]   Overall Financial Resource Strain (CARDIA)    Difficulty of Paying Living Expenses: Somewhat  hard  Food Insecurity: No Food Insecurity (04/16/2024)   Hunger Vital Sign    Worried About Running Out of Food in the Last Year: Never true    Ran Out of Food in the Last Year: Never true  Transportation Needs: No Transportation Needs (04/16/2024)   PRAPARE - Administrator, Civil Service (Medical): No    Lack of Transportation (Non-Medical): No  Physical Activity: Unknown (09/05/2022)   Received from [facility]   Exercise Vital Sign    On average, how many days per week do you engage in moderate to strenuous exercise (like a brisk walk)?: Patient declined    Minutes of Exercise per Session: Not on file  Stress:  No Stress Concern Present (09/05/2022)   Received from [facility] of Occupational Health - Occupational Stress Questionnaire    Feeling of Stress : Only a little  Social Connections: Socially Integrated (04/16/2024)   Social Connection and Isolation Panel    Frequency of Communication with Friends and Family: Twice a week    Frequency of Social Gatherings with Friends and Family: Twice a week    Attends Religious Services: 1 to 4 times per year    Active Member of [employer] or Organizations: No    Attends Engineer, structural: 1 to 4 times per year    Marital Status: Married  Recent Concern: Social Connections - Moderately Isolated (03/29/2024)   Social Connection and Isolation Panel    Frequency of Communication with Friends and Family: More than three times a week    Frequency of Social Gatherings with Friends and Family: Twice a week    Attends Religious Services: Never    Database administrator or Organizations: No    Attends Engineer, structural: Never    Marital Status: Married   Water engineer and Provision:  Detailed education and instructions provided on heart failure disease management including the following:  Signs and symptoms of Heart Failure When to call the physician Importance of daily weights Low sodium diet Fluid restriction Medication management Anticipated future follow-up appointments  Patient education given on each of the above topics.  Patient acknowledges understanding via teach back method and acceptance of all instructions.  Education Materials:  Living Better With Heart Failure Booklet, HF zone tool, & Daily Weight Tracker Tool.  Patient has scale at home: Yes Patient has pill box at home: No.  Navigator reached out to SW to deliver one to the patient for discharge home.    High Risk Criteria for Readmission and/or Poor Patient Outcomes: Heart failure hospital admissions (last 6 months): 2  No Show rate:  3% Difficult social situation: None Demonstrates medication adherence: Yes,but would like a pill box for better medication organization.  Primary Language: English Literacy level: Reading, Writing & Comprehension  Barriers of Care:   Hospital Readmit for CHF Performing Daily Weights  Considerations/Referrals:  Referral made to Heart Failure Pharmacist Stewardship: No Referral made to Heart Failure CSW/NCM TOC: Yes-for a pill box to be delivered to the bedside. Referral made to Heart & Vascular TOC clinic: Yes 04/25/24 @ 3:30 PM [facility]  Items for Follow-up on DC/TOC: Daily Weights Diet & Fluid Restrictions Continued Heart Failure Education  Charmaine Pines, RN, BSN [facility] Heart Failure Navigator Secure Chat Only

## 2024-04-19 ENCOUNTER — Inpatient Hospital Stay (HOSPITAL_COMMUNITY)

## 2024-04-19 DIAGNOSIS — I5043 Acute on chronic combined systolic (congestive) and diastolic (congestive) heart failure: Secondary | ICD-10-CM | POA: Diagnosis not present

## 2024-04-19 DIAGNOSIS — J9601 Acute respiratory failure with hypoxia: Secondary | ICD-10-CM

## 2024-04-19 DIAGNOSIS — N178 Other acute kidney failure: Secondary | ICD-10-CM | POA: Diagnosis not present

## 2024-04-19 DIAGNOSIS — I25810 Atherosclerosis of coronary artery bypass graft(s) without angina pectoris: Secondary | ICD-10-CM | POA: Diagnosis not present

## 2024-04-19 DIAGNOSIS — I4819 Other persistent atrial fibrillation: Secondary | ICD-10-CM | POA: Diagnosis not present

## 2024-04-19 DIAGNOSIS — Z9889 Other specified postprocedural states: Secondary | ICD-10-CM | POA: Diagnosis not present

## 2024-04-19 DIAGNOSIS — I5023 Acute on chronic systolic (congestive) heart failure: Secondary | ICD-10-CM | POA: Diagnosis not present

## 2024-04-19 LAB — CBC WITH DIFFERENTIAL/PLATELET
Abs Immature Granulocytes: 0.03 K/uL (ref 0.00–0.07)
Basophils Absolute: 0 K/uL (ref 0.0–0.1)
Basophils Relative: 0 %
Eosinophils Absolute: 0 K/uL (ref 0.0–0.5)
Eosinophils Relative: 0 %
HCT: 38.5 % — ABNORMAL LOW (ref 39.0–52.0)
Hemoglobin: 12.3 g/dL — ABNORMAL LOW (ref 13.0–17.0)
Immature Granulocytes: 0 %
Lymphocytes Relative: 12 %
Lymphs Abs: 1.2 K/uL (ref 0.7–4.0)
MCH: 29.5 pg (ref 26.0–34.0)
MCHC: 31.9 g/dL (ref 30.0–36.0)
MCV: 92.3 fL (ref 80.0–100.0)
Monocytes Absolute: 1.2 K/uL — ABNORMAL HIGH (ref 0.1–1.0)
Monocytes Relative: 11 %
Neutro Abs: 7.7 K/uL (ref 1.7–7.7)
Neutrophils Relative %: 77 %
Platelets: 155 K/uL (ref 150–400)
RBC: 4.17 MIL/uL — ABNORMAL LOW (ref 4.22–5.81)
RDW: 15.5 % (ref 11.5–15.5)
WBC: 10.2 K/uL (ref 4.0–10.5)
nRBC: 0 % (ref 0.0–0.2)

## 2024-04-19 LAB — BASIC METABOLIC PANEL WITH GFR
Anion gap: 12 (ref 5–15)
BUN: 41 mg/dL — ABNORMAL HIGH (ref 8–23)
CO2: 24 mmol/L (ref 22–32)
Calcium: 8.5 mg/dL — ABNORMAL LOW (ref 8.9–10.3)
Chloride: 98 mmol/L (ref 98–111)
Creatinine, Ser: 2.62 mg/dL — ABNORMAL HIGH (ref 0.61–1.24)
GFR, Estimated: 23 mL/min — ABNORMAL LOW (ref >60)
Glucose, Bld: 162 mg/dL — ABNORMAL HIGH (ref 70–99)
Potassium: 4.2 mmol/L (ref 3.5–5.1)
Sodium: 134 mmol/L — ABNORMAL LOW (ref 135–145)

## 2024-04-19 LAB — GLUCOSE, CAPILLARY
Glucose-Capillary: 156 mg/dL — ABNORMAL HIGH (ref 70–99)
Glucose-Capillary: 161 mg/dL — ABNORMAL HIGH (ref 70–99)
Glucose-Capillary: 233 mg/dL — ABNORMAL HIGH (ref 70–99)
Glucose-Capillary: 140 mg/dL — ABNORMAL HIGH (ref 70–99)
Glucose-Capillary: 157 mg/dL — ABNORMAL HIGH (ref 70–99)

## 2024-04-19 LAB — RESPIRATORY PANEL BY PCR

## 2024-04-19 LAB — CK: Total CK: 690 U/L — ABNORMAL HIGH (ref 49–397)

## 2024-04-19 LAB — MAGNESIUM: Magnesium: 2.3 mg/dL (ref 1.7–2.4)

## 2024-04-19 NOTE — NC FL2 (Signed)
 [place]  MEDICAID FL2 LEVEL OF CARE FORM     IDENTIFICATION  Patient Name: Wayne Gordon Birthdate: 02-01-1934 Sex: male Admission Date (Current Location): 04/16/2024  [facility] [place] and [place] Number:  [employer] and Address:  [facility],  [address] [place] [address]      Provider Number: [id number]  Attending Physician Name and Address:  Evonnie Lenis, MD  Relative Name and Phone Number:       Current Level of Care: Hospital Recommended Level of Care: Skilled Nursing Facility Prior Approval Number:    Date Approved/Denied:   PASRR Number: [id number]  Discharge Plan: SNF    Current Diagnoses: Patient Active Problem List   Diagnosis Date Noted   Acute renal failure superimposed on stage 4 chronic kidney disease (HCC) 04/17/2024   Acute on chronic HFrEF (heart failure with reduced ejection fraction) (HCC) 04/17/2024   Acute HFrEF (heart failure with reduced ejection fraction) (HCC) 04/16/2024   Chronic kidney disease (CKD), stage IV (severe) (HCC) 04/16/2024   Abnormal weight gain 04/16/2024   Hypokalemia 04/16/2024   Atrial flutter (HCC) 04/01/2024   Acute CHF (congestive heart failure) (HCC) 03/29/2024   Human metapneumovirus pneumonia 10/16/2021   Acute on chronic systolic CHF (congestive heart failure) (HCC) 10/16/2021   CHF exacerbation (HCC) 10/15/2021   CAD (coronary artery disease) of artery bypass graft 10/15/2021   History of implantable cardioverter-defibrillator (ICD) placement 10/15/2021   Benign essential HTN 10/15/2021   Chronic kidney disease, stage 3b (HCC) 10/15/2021   Acute respiratory failure with hypoxia (HCC) 10/14/2021   Bloating 02/27/2020   Nausea with vomiting 02/27/2020   Abdominal pain 02/27/2020   Iron deficiency anemia 02/08/2018   Diabetes mellitus (HCC) 02/08/2018   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   Bacteremia due to Klebsiella pneumoniae 09/22/2017   Sepsis due to Klebsiella pneumoniae (HCC)  09/22/2017   Cholangitis 09/17/2017   Leukocytosis 09/17/2017   Cholelithiasis 09/17/2017   Elevated liver enzymes 09/17/2017   Acute renal failure superimposed on stage 3 chronic kidney disease (HCC) 09/17/2017   Low blood pressure reading 09/17/2017   Lactic acidosis 09/17/2017   Elevated troponin 09/17/2017   Nausea without vomiting 01/10/2017   Gastrointestinal hemorrhage associated with intestinal diverticulosis 06/23/2016   Carotid artery disease (HCC)    Syncope and collapse 07/11/2015   Ventricular arrhythmia 07/10/2015   Complete heart block (HCC)    ICD (implantable cardioverter-defibrillator) discharge    Atrial fibrillation (HCC) 06/04/2015   Diabetes (HCC) 06/02/2015   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/02/2015   GERD (gastroesophageal reflux disease) 02/18/2015   Abdominal pain, epigastric 02/18/2015   Pleural effusion 09/01/2014   Rectal bleeding 03/25/2014   Acute blood loss anemia 03/25/2014   Adult body mass index 28.0-28.9 09/30/2013   Diabetes mellitus, type 2 (HCC) 08/09/2013   Type 2 diabetes mellitus without complication (HCC) 08/09/2013   PVC's (premature ventricular contractions) 07/04/2013   Hyperlipidemia 01/03/2013   Automatic implantable cardioverter-defibrillator in situ 03/30/2012   S/P MVR (mitral valve repair) 01/16/2012   HTN (hypertension) 11/28/2011   Chronic systolic heart failure (HCC) 10/31/2011   Ischemic cardiomyopathy 10/31/2011   CKD (chronic kidney disease) stage 3, GFR 30-59 ml/min (HCC) 10/31/2011   Current use of long term anticoagulation 10/31/2011   Long term (current) use of anticoagulants 10/28/2011   S/P mitral valve repair 10/17/2011   S/P CABG x 3 10/17/2011   Mitral regurgitation 10/12/2011   NSTEMI (non-ST elevated myocardial infarction) (HCC) 10/11/2011    Orientation RESPIRATION BLADDER Height &  Weight     Self, Time, Situation, Place  O2 (2L) Continent Weight: 253 lb 8.5 oz (115 kg) Height:  6' 6 (198.1 cm)   BEHAVIORAL SYMPTOMS/MOOD NEUROLOGICAL BOWEL NUTRITION STATUS      Continent Diet (Heart healthy)  AMBULATORY STATUS COMMUNICATION OF NEEDS Skin   Extensive Assist Verbally Normal                       Personal Care Assistance Level of Assistance  Bathing, Feeding, Dressing Bathing Assistance: Limited assistance Feeding assistance: Independent Dressing Assistance: Limited assistance     Functional Limitations Info  Sight, Hearing, Speech Sight Info: Adequate Hearing Info: Impaired Speech Info: Adequate    SPECIAL CARE FACTORS FREQUENCY  PT (By licensed PT), OT (By licensed OT)     PT Frequency: 5 times weekly OT Frequency: 5 times weekly            Contractures Contractures Info: Not present    Additional Factors Info  Code Status, Allergies Code Status Info: FULL Allergies Info: ace inhibitors and penicillins           Current Medications (04/19/2024):  This is the current hospital active medication list Current Facility-Administered Medications  Medication Dose Route Frequency Provider Last Rate Last Admin   acetaminophen  (TYLENOL ) tablet 650 mg  650 mg Oral Q4H PRN Johnson, Clanford L, MD       apixaban  (ELIQUIS ) tablet 2.5 mg  2.5 mg Oral BID Johnson, Clanford L, MD   2.5 mg at 04/19/24 0813   arformoterol  (BROVANA ) nebulizer solution 15 mcg  15 mcg Nebulization BID Tat, Alm, MD   15 mcg at 04/19/24 9164   budesonide  (PULMICORT ) nebulizer solution 0.5 mg  0.5 mg Nebulization BID Tat, Alm, MD   0.5 mg at 04/19/24 9164   carvedilol  (COREG ) tablet 12.5 mg  12.5 mg Oral BID WC Johnson, Clanford L, MD   12.5 mg at 04/19/24 9187   dapagliflozin  propanediol (FARXIGA ) tablet 10 mg  10 mg Oral QHS Johnson, Clanford L, MD   10 mg at 04/18/24 2121   furosemide  (LASIX ) injection 80 mg  80 mg Intravenous TID Mallipeddi, Vishnu P, MD   80 mg at 04/19/24 9187   gabapentin  (NEURONTIN ) capsule 100 mg  100 mg Oral QHS Johnson, Clanford L, MD   100 mg at 04/18/24 2121    guaiFENesin  (ROBITUSSIN) 100 MG/5ML liquid 5 mL  5 mL Oral Q4H PRN Opyd, Timothy S, MD   5 mL at 04/18/24 2118   insulin  aspart (novoLOG ) injection 0-9 Units  0-9 Units Subcutaneous TID WC Johnson, Clanford L, MD   3 Units at 04/19/24 1147   insulin  glargine-yfgn (SEMGLEE ) injection 8 Units  8 Units Subcutaneous QHS Evonnie Alm, MD   8 Units at 04/18/24 2118   ondansetron  (ZOFRAN ) injection 4 mg  4 mg Intravenous Q6H PRN Johnson, Clanford L, MD       pantoprazole  (PROTONIX ) EC tablet 40 mg  40 mg Oral QAC breakfast Johnson, Clanford L, MD   40 mg at 04/19/24 0813   potassium chloride  SA (KLOR-CON  M) CR tablet 40 mEq  40 mEq Oral BID Johnson, Clanford L, MD   40 mEq at 04/19/24 0813   sodium chloride  flush (NS) 0.9 % injection 3 mL  3 mL Intravenous Q12H Johnson, Clanford L, MD   3 mL at 04/19/24 0904   sodium chloride  flush (NS) 0.9 % injection 3 mL  3 mL Intravenous PRN Vicci Afton CROME, MD  Discharge Medications: Please see discharge summary for a list of discharge medications.  Relevant Imaging Results:  Relevant Lab Results:   Additional Information SSN: [id number], [place]

## 2024-04-19 NOTE — Plan of Care (Signed)
   Problem: Clinical Measurements: Goal: Ability to maintain clinical measurements within normal limits will improve Outcome: Progressing Goal: Diagnostic test results will improve Outcome: Progressing   Problem: Activity: Goal: Risk for activity intolerance will decrease Outcome: Progressing

## 2024-04-19 NOTE — Progress Notes (Signed)
 Progress Note  Patient Name: Wayne Gordon Date of Encounter: 04/19/2024  Primary Cardiologist: Capone Schwinn P Taletha Twiford, MD  Subjective   SOB significantly improved according to the patient.  Inpatient Medications    Scheduled Meds:  apixaban   2.5 mg Oral BID   arformoterol   15 mcg Nebulization BID   budesonide  (PULMICORT ) nebulizer solution  0.5 mg Nebulization BID   carvedilol   12.5 mg Oral BID WC   dapagliflozin  propanediol  10 mg Oral QHS   furosemide   80 mg Intravenous TID   gabapentin   100 mg Oral QHS   insulin  aspart  0-9 Units Subcutaneous TID WC   insulin  glargine-yfgn  8 Units Subcutaneous QHS   pantoprazole   40 mg Oral QAC breakfast   potassium chloride   40 mEq Oral BID   sodium chloride  flush  3 mL Intravenous Q12H   Continuous Infusions:  PRN Meds: acetaminophen , guaiFENesin , ondansetron  (ZOFRAN ) IV, sodium chloride  flush   Vital Signs    Vitals:   04/18/24 2200 04/19/24 0432 04/19/24 0456 04/19/24 0835  BP: 110/62 (!) 93/59    Pulse: 69 81    Resp: 16 16    Temp: 99.1 F (37.3 C) 99.2 F (37.3 C)    TempSrc: Oral Oral    SpO2: 95% 93%  92%  Weight:   115 kg   Height:        Intake/Output Summary (Last 24 hours) at 04/19/2024 1031 Last data filed at 04/19/2024 1005 Gross per 24 hour  Intake 723 ml  Output 1400 ml  Net -677 ml   Filed Weights   04/17/24 0400 04/18/24 0430 04/19/24 0456  Weight: 111.1 kg 115.5 kg 115 kg    Telemetry     Personally reviewed.  Paced rhythm.  ECG    Not performed today.  Physical Exam   GEN: No acute distress.   Neck:  unable to examine. Cardiac: RRR, no murmur, rub, or gallop.  Respiratory: Nonlabored.  Wheezing present. GI: Soft, nontender, bowel sounds present. MS: Minimal pitting edema; No deformity. Neuro:  Nonfocal. Psych: Alert and oriented x 3. Normal affect.  Labs    Chemistry Recent Labs  Lab 04/16/24 1029 04/17/24 0358 04/18/24 0410 04/19/24 0407  NA 136 139 138 134*  K 3.4*  3.8 4.2 4.2  CL 99 102 101 98  CO2 26 24 26 24   GLUCOSE 130* 74 160* 162*  BUN 36* 37* 42* 41*  CREATININE 2.75* 2.60* 2.68* 2.62*  CALCIUM  8.2* 8.4* 8.4* 8.5*  PROT 6.1*  --   --   --   ALBUMIN  3.2*  --   --   --   AST 25  --   --   --   ALT 20  --   --   --   ALKPHOS 75  --   --   --   BILITOT 1.2  --   --   --   GFRNONAA 22* 23* 22* 23*  ANIONGAP 11 13 11 12      Hematology Recent Labs  Lab 04/17/24 0358 04/18/24 0410 04/19/24 0407  WBC 12.6* 11.1* 10.2  RBC 4.60 4.45 4.17*  HGB 13.5 12.8* 12.3*  HCT 42.4 41.0 38.5*  MCV 92.2 92.1 92.3  MCH 29.3 28.8 29.5  MCHC 31.8 31.2 31.9  RDW 15.3 15.5 15.5  PLT 203 172 155    Cardiac Enzymes Recent Labs  Lab 03/29/24 1120 03/29/24 1302 04/16/24 1029 04/16/24 1155  TROPONINIHS 19* 19* 25* 25*    BNP Recent  Labs  Lab 04/16/24 1029 04/17/24 0358  BNP 681.0* 927.0*     DDimerNo results for input(s): DDIMER in the last 168 hours.   Radiology    No results found.   Assessment & Plan   Acute on chronic combined systolic and diastolic heart failure: Presented with 13 pound weight gain and leg swelling.  Not much active at home.  BNP 927.  SOB significantly improved compared to admission.  Continue IV Lasix  80 mg twice daily and  anticipate switching to p.o. torsemide 20 mg once daily starting tomorrow.  He does have wheezing bilaterally, likely contributed by pulmonary etiology.  Reds Vest reading poor quality. CXR today. Discontinue home Lasix .  Continue carvedilol  12.5 mg twice daily, Farxiga  10 mg once daily. Not on ACE/ARB/MRA/ARNI due to CKD stage IIIb-IV.   CAD s/p CABG: No angina.  Continue rosuvastatin  5 mg nightly.  Not on aspirin  due to Eliquis  use.   Persistent A-fib s/p DCCV in August 2025: EKG showed ventricular paced rhythm.  Continue carvedilol  12.5 mg twice daily and Eliquis  2.5 mg twice daily.  Previously on amiodarone  however had to be discontinued due to worsening dizziness.   S/p CRT-D:  Outpatient EP follow-up.  Signed, Diannah SHAUNNA Maywood, MD  04/19/2024, 10:31 AM

## 2024-04-19 NOTE — Progress Notes (Signed)
   04/19/24 0900  ReDS Vest / Clip  Station Marker D  Ruler Value 47  Anatomical Comments low quality

## 2024-04-19 NOTE — Progress Notes (Signed)
 9179 Patient eating, unavailable for nebulizer at this time.

## 2024-04-19 NOTE — Care Management Important Message (Signed)
 Important Message  Patient Details  Name: Wayne Gordon MRN: [id number] Date of Birth: 11-14-1933   Important Message Given:  Yes - Medicare IM     [name] 04/19/2024, 11:56 AM

## 2024-04-19 NOTE — Plan of Care (Signed)
  Problem: Health Behavior/Discharge Planning: Goal: Ability to manage health-related needs will improve Outcome: Progressing   Problem: Metabolic: Goal: Ability to maintain appropriate glucose levels will improve Outcome: Progressing   Problem: Clinical Measurements: Goal: Cardiovascular complication will be avoided Outcome: Progressing   Problem: Activity: Goal: Risk for activity intolerance will decrease Outcome: Progressing   Problem: Coping: Goal: Level of anxiety will decrease Outcome: Progressing   Problem: Pain Managment: Goal: General experience of comfort will improve and/or be controlled Outcome: Progressing   Problem: Skin Integrity: Goal: Risk for impaired skin integrity will decrease Outcome: Progressing

## 2024-04-19 NOTE — TOC Progression Note (Signed)
 Transition of Care [facility]) - Progression Note    Patient Details  Name: Wayne Gordon MRN: [id number] Date of Birth: 01-12-1934  [facility] CM/SW Contact  Lucie Lunger, [place] Phone Number: 04/19/2024, 1:09 PM  Clinical Narrative:    CSW updated by MD that after speaking with pt, wife and daughter they are in agreement with SNF placement. CSW spoke with pts spouse to confirm, she states SNF at [facility] would be preference if a bed is offered. CSW to send out referral and follow. TOC to follow.   Expected Discharge Plan: Home w Home Health Services Barriers to Discharge: Continued Medical Work up               Expected Discharge Plan and Services In-house Referral: Clinical Social Work Discharge Planning Services: CM Consult Post Acute Care Choice: Home Health Living arrangements for the past 2 months: Single Family Home                           HH Arranged: PT, OT HH Agency: [facility] Date [facility] Agency Contacted: 04/17/24   Representative spoke with at [facility] Agency: Darleene   Social Drivers of Health (SDOH) Interventions SDOH Screenings   Food Insecurity: No Food Insecurity (04/16/2024)  Housing: Low Risk  (04/16/2024)  Transportation Needs: No Transportation Needs (04/16/2024)  Utilities: Not At Risk (04/16/2024)  Financial Resource Strain: Medium Risk (09/05/2022)   Received from [facility]  Physical Activity: Unknown (09/05/2022)   Received from [facility]  Social Connections: Socially Integrated (04/16/2024)  Recent Concern: Social Connections - Moderately Isolated (03/29/2024)  Stress: No Stress Concern Present (09/05/2022)   Received from [facility]  Tobacco Use: Low Risk  (04/18/2024)  Health Literacy: Low Risk  (04/06/2020)   Received from [facility]    Readmission Risk Interventions    04/17/2024   11:19 AM 04/01/2024   10:47 AM 10/15/2021    9:16 AM  Readmission Risk Prevention Plan  Medication Screening   Complete   Transportation Screening Complete Complete Complete  PCP or Specialist Appt within 5-7 Days  Complete   Home Care Screening Complete Complete   Medication Review (RN CM) Complete Complete

## 2024-04-19 NOTE — Progress Notes (Addendum)
 PROGRESS NOTE  Wayne Gordon FMW:[id number] DOB: 11-05-1933 DOA: 04/16/2024 PCP: Renato Dorothey HERO, NP  Brief History:  89 year old male with HFrEF, CAD status post CABG and mitral valve repair 2013, LVEF 20-25%, status post ICD, pacer placement, stage IV CKD, persistent atrial fibrillation/atrial flutter on chronic anticoagulation with apixaban  status post DCCV 84/25, ype 2 diabetes mellitus on insulin , with renal complications, hyperlipidemia, hypertension, coronary artery disease, status post mitral valve repair, recent hospitalizations, recently discharged 04/01/2024 with a discharge weight of 234 pounds at that time.  EP had performed a cardioversion on 8/4 for symptomatic atrial flutter.  The patient was admitted to the hospital from 03/29/2024 to 04/01/2024 for acute on chronic HFrEF.   He was discharged home on furosemide  40 mg p.o. daily.  Since hospital discharge, his weight has steadily climbed despite taking oral diuretics at home.  He is now up to 247 pounds.  He is increasingly becoming weaker.  He saw cardiology in the outpatient setting recently and was taken off amiodarone  04/05/24 due to patient report of arm and leg weakness.  He was brought into the emergency department because he continued to have swelling and weight gain in his legs and worsening shortness of breath.  He complained of severe fatigue and weakness and was unable to get out of bed this morning because he was so weak.  He was advised by the cardiology office to call EMS and come to the ED.  He was noted to be severely deconditioned and frail with significant peripheral edema, elevated BNP of 681, troponin 25, potassium 3.4, creatinine 2.75 and request was made for inpatient hospitalization for IV diuresis as he is failing outpatient diuresis with oral medication likely due to advanced renal failure.   Assessment/Plan: Acute on chronic HFrEF - 03/30/2024 echo EF 20-25%, global HK, mild decreased RVF, s/p MV  annuloplasty - Remains clinically fluid overloaded but improving sob - Continue IV furosemide --increased to 80 mg IV  TID - Cardiology consult appreciated - Daily weights--?accuracy - Accurate I's and O's--NEG 2.8L - 04/01/2024 discharge weight 234 lbs - Admission weight 247 lbs   Persistent atrial fibrillation - Status post DCCV 04/01/2024 - Continue carvedilol  - Continue apixaban  - Previously on amiodarone , but discontinued secondary to dizziness and generalized weakness   Acute on chronic CKD stage IV - Baseline creatinine 1.9-2.1 - Presented with serum creatinine 2.75 - Monitor with diuresis - Secondary to cardiorenal syndrome  Acute respiratory failure with hypoxia -he is now on 2L -CXR   Coronary artery disease /elevated troponin - Status post CABG -Secondary to demand ischemia - No angina - holding  Crestor  temporarily due to elevated CK - Currently on apixaban    Diabetes mellitus type 2, uncontrolled with hyperglycemia - 03/29/2024 hemoglobin A1c 7.7 - Continue NovoLog  sliding scale - Continue Semglee  reduced dose - He is on Toujeo  26 units daily at home   Generalized weakness - Secondary to decompensated heart failure and electrolyte derangements and AKI - B12--1326 - Folic acid--19.7 - TSH--4.368 - PT evaluation>>SNF - UA no pyuria - CK 509>>690   Hypokalemia - Replete - Check magnesium --2.4   Mixed hyperlipidemia - holding statin temporarily due to elevated CK   Sinus node dysfunction s/p ICD 2013 with upgrade of Biotronik BiV ICD 09/2020 - Device interrogation 02/2024: AP/VP rhythm with no treated arrhythmias - Continue to follow with EP     Family Communication:   spouse/daughter at bedside 8/21   Consultants:  cardiology   Code Status:  FULL    DVT Prophylaxis:  apixaban      Procedures: As Listed in Progress Note Above   Antibiotics: None          Subjective: Pt states sob is improving.  Denies f/c, cp, n/v/d.  Has intermittent  abd pain.  Has dry cough  Objective: Vitals:   04/19/24 0432 04/19/24 0456 04/19/24 0835 04/19/24 1302  BP: (!) 93/59   (!) 109/58  Pulse: 81   81  Resp: 16   (!) 23  Temp: 99.2 F (37.3 C)   98.1 F (36.7 C)  TempSrc: Oral   Oral  SpO2: 93%  92% 93%  Weight:  115 kg    Height:        Intake/Output Summary (Last 24 hours) at 04/19/2024 1319 Last data filed at 04/19/2024 1301 Gross per 24 hour  Intake 723 ml  Output 1400 ml  Net -677 ml   Weight change: -0.5 kg Exam:  General:  Pt is alert, follows commands appropriately, not in acute distress HEENT: No icterus, No thrush, No neck mass, [place]/AT Cardiovascular: IRRR, S1/S2, no rubs, no gallops Respiratory: bibasilar rales.   Abdomen: Soft/+BS, non tender, non distended, no guarding Extremities: 1 + LE edema, No lymphangitis, No petechiae, No rashes, no synovitis   Data Reviewed: I have personally reviewed following labs and imaging studies Basic Metabolic Panel: Recent Labs  Lab 04/16/24 1029 04/17/24 0358 04/18/24 0410 04/19/24 0407  NA 136 139 138 134*  K 3.4* 3.8 4.2 4.2  CL 99 102 101 98  CO2 26 24 26 24   GLUCOSE 130* 74 160* 162*  BUN 36* 37* 42* 41*  CREATININE 2.75* 2.60* 2.68* 2.62*  CALCIUM  8.2* 8.4* 8.4* 8.5*  MG  --  2.4 2.4 2.3   Liver Function Tests: Recent Labs  Lab 04/16/24 1029  AST 25  ALT 20  ALKPHOS 75  BILITOT 1.2  PROT 6.1*  ALBUMIN  3.2*   No results for input(s): LIPASE, AMYLASE in the last 168 hours. No results for input(s): AMMONIA in the last 168 hours. Coagulation Profile: No results for input(s): INR, PROTIME in the last 168 hours. CBC: Recent Labs  Lab 04/16/24 1029 04/17/24 0358 04/18/24 0410 04/19/24 0407  WBC 10.8* 12.6* 11.1* 10.2  NEUTROABS 8.4* 9.5* 8.6* 7.7  HGB 13.1 13.5 12.8* 12.3*  HCT 41.2 42.4 41.0 38.5*  MCV 92.6 92.2 92.1 92.3  PLT 206 203 172 155   Cardiac Enzymes: Recent Labs  Lab 04/18/24 0410 04/19/24 0407  CKTOTAL 509* 690*    BNP: Invalid input(s): POCBNP CBG: Recent Labs  Lab 04/18/24 1613 04/18/24 2054 04/19/24 0244 04/19/24 0726 04/19/24 1119  GLUCAP 144* 190* 156* 161* 233*   HbA1C: No results for input(s): HGBA1C in the last 72 hours. Urine analysis:    Component Value Date/Time   COLORURINE YELLOW 04/16/2024 1104   APPEARANCEUR CLEAR 04/16/2024 1104   LABSPEC 1.014 04/16/2024 1104   PHURINE 5.0 04/16/2024 1104   GLUCOSEU >=500 (A) 04/16/2024 1104   HGBUR NEGATIVE 04/16/2024 1104   BILIRUBINUR NEGATIVE 04/16/2024 1104   KETONESUR NEGATIVE 04/16/2024 1104   PROTEINUR NEGATIVE 04/16/2024 1104   UROBILINOGEN 0.2 02/04/2014 0939   NITRITE NEGATIVE 04/16/2024 1104   LEUKOCYTESUR NEGATIVE 04/16/2024 1104   Sepsis Labs: @LABRCNTIP (procalcitonin:4,lacticidven:4) ) Recent Results (from the past 240 hours)  Resp panel by RT-PCR (RSV, Flu A&B, Covid) Anterior Nasal Swab     Status: None   Collection Time: 04/16/24 11:04 AM  Specimen: Anterior Nasal Swab  Result Value Ref Range Status   SARS Coronavirus 2 by RT PCR NEGATIVE NEGATIVE Final    Comment: (NOTE) SARS-CoV-2 target nucleic acids are NOT DETECTED.  The SARS-CoV-2 RNA is generally detectable in upper respiratory specimens during the acute phase of infection. The lowest concentration of SARS-CoV-2 viral copies this assay can detect is 138 copies/mL. A negative result does not preclude SARS-Cov-2 infection and should not be used as the sole basis for treatment or other patient management decisions. A negative result may occur with  improper specimen collection/handling, submission of specimen other than nasopharyngeal swab, presence of viral mutation(s) within the areas targeted by this assay, and inadequate number of viral copies(<138 copies/mL). A negative result must be combined with clinical observations, patient history, and epidemiological information. The expected result is Negative.  Fact Sheet for Patients:   BloggerCourse.com  Fact Sheet for Healthcare Providers:  SeriousBroker.it  This test is no t yet approved or cleared by the [place]  FDA and  has been authorized for detection and/or diagnosis of SARS-CoV-2 by FDA under an Emergency Use Authorization (EUA). This EUA will remain  in effect (meaning this test can be used) for the duration of the COVID-19 declaration under Section 564(b)(1) of the Act, 21 U.S.C.section 360bbb-3(b)(1), unless the authorization is terminated  or revoked sooner.       Influenza A by PCR NEGATIVE NEGATIVE Final   Influenza B by PCR NEGATIVE NEGATIVE Final    Comment: (NOTE) The Xpert Xpress SARS-CoV-2/FLU/RSV plus assay is intended as an aid in the diagnosis of influenza from Nasopharyngeal swab specimens and should not be used as a sole basis for treatment. Nasal washings and aspirates are unacceptable for Xpert Xpress SARS-CoV-2/FLU/RSV testing.  Fact Sheet for Patients: BloggerCourse.com  Fact Sheet for Healthcare Providers: SeriousBroker.it  This test is not yet approved or cleared by the [place]  FDA and has been authorized for detection and/or diagnosis of SARS-CoV-2 by FDA under an Emergency Use Authorization (EUA). This EUA will remain in effect (meaning this test can be used) for the duration of the COVID-19 declaration under Section 564(b)(1) of the Act, 21 U.S.C. section 360bbb-3(b)(1), unless the authorization is terminated or revoked.     Resp Syncytial Virus by PCR NEGATIVE NEGATIVE Final    Comment: (NOTE) Fact Sheet for Patients: BloggerCourse.com  Fact Sheet for Healthcare Providers: SeriousBroker.it  This test is not yet approved or cleared by the [place]  FDA and has been authorized for detection and/or diagnosis of SARS-CoV-2 by FDA under an Emergency Use  Authorization (EUA). This EUA will remain in effect (meaning this test can be used) for the duration of the COVID-19 declaration under Section 564(b)(1) of the Act, 21 U.S.C. section 360bbb-3(b)(1), unless the authorization is terminated or revoked.  Performed at [facility], [address], [place], [place] [address]   Respiratory (~20 pathogens) panel by PCR     Status: None   Collection Time: 04/18/24  4:55 PM   Specimen: Nasopharyngeal Swab; Respiratory  Result Value Ref Range Status   Adenovirus NOT DETECTED NOT DETECTED Final   Coronavirus 229E NOT DETECTED NOT DETECTED Final    Comment: (NOTE) The Coronavirus on the Respiratory Panel, DOES NOT test for the novel  Coronavirus (2019 nCoV)    Coronavirus HKU1 NOT DETECTED NOT DETECTED Final   Coronavirus NL63 NOT DETECTED NOT DETECTED Final   Coronavirus OC43 NOT DETECTED NOT DETECTED Final   Metapneumovirus NOT DETECTED NOT DETECTED Final  Rhinovirus / Enterovirus NOT DETECTED NOT DETECTED Final   Influenza A NOT DETECTED NOT DETECTED Final   Influenza B NOT DETECTED NOT DETECTED Final   Parainfluenza Virus 1 NOT DETECTED NOT DETECTED Final   Parainfluenza Virus 2 NOT DETECTED NOT DETECTED Final   Parainfluenza Virus 3 NOT DETECTED NOT DETECTED Final   Parainfluenza Virus 4 NOT DETECTED NOT DETECTED Final   Respiratory Syncytial Virus NOT DETECTED NOT DETECTED Final   Bordetella pertussis NOT DETECTED NOT DETECTED Final   Bordetella Parapertussis NOT DETECTED NOT DETECTED Final   Chlamydophila pneumoniae NOT DETECTED NOT DETECTED Final   Mycoplasma pneumoniae NOT DETECTED NOT DETECTED Final    Comment: Performed at [facility], [address] [place], [place] [address]     Scheduled Meds:  apixaban   2.5 mg Oral BID   arformoterol   15 mcg Nebulization BID   budesonide  (PULMICORT ) nebulizer solution  0.5 mg Nebulization BID   carvedilol   12.5 mg Oral BID WC   dapagliflozin  propanediol  10 mg Oral QHS    furosemide   80 mg Intravenous TID   gabapentin   100 mg Oral QHS   insulin  aspart  0-9 Units Subcutaneous TID WC   insulin  glargine-yfgn  8 Units Subcutaneous QHS   pantoprazole   40 mg Oral QAC breakfast   potassium chloride   40 mEq Oral BID   sodium chloride  flush  3 mL Intravenous Q12H   Continuous Infusions:  Procedures/Studies: DG Chest Portable 1 View Result Date: 04/16/2024 CLINICAL DATA:  Weakness. EXAM: PORTABLE CHEST 1 VIEW COMPARISON:  March 29, 2024. FINDINGS: Stable cardiomegaly. Sternotomy wires are noted. Left-sided defibrillator is unchanged. Elevated right hemidiaphragm is noted. Left lung base is not completely included in field-of-view. Visualized lung parenchyma is unremarkable. Bony thorax is unremarkable. IMPRESSION: Left lung base is not completely included in field-of-view. No definite abnormality seen in the visualized lung parenchyma. Electronically Signed   By: Lynwood Landy Raddle M.D.   On: 04/16/2024 12:06   EP STUDY Result Date: 04/01/2024 See surgical note for result.  ECHOCARDIOGRAM COMPLETE Result Date: 03/30/2024    ECHOCARDIOGRAM REPORT   Patient Name:   Wayne Gordon Date of Exam: 03/30/2024 Medical Rec #:  [id number]      Height:       76.0 in Accession #:    [id number]     Weight:       242.9 lb Date of Birth:  02/13/1934       BSA:          2.406 m Patient Age:    89 years       BP:           106/66 mmHg Patient Gender: M              HR:           80 bpm. Exam Location:  Inpatient Procedure: 2D Echo, Cardiac Doppler, Color Doppler and Intracardiac            Opacification Agent (Both Spectral and Color Flow Doppler were            utilized during procedure). Indications:    I50.21 Acute systolic (congestive) heart failure  History:        Patient has prior history of Echocardiogram examinations, most                 recent 10/15/2021. CHF, CAD, Prior CABG and Defibrillator,  Arrythmias:Atrial Fibrillation; Risk Factors:Hypertension and                  Diabetes.  Sonographer:    Damien Senior RDCS Referring Phys: [phone] JACOB J STINSON IMPRESSIONS  1. No LV thrombus by Definity . Left ventricular ejection fraction, by estimation, is 20 to 25%. The left ventricle has severely decreased function. The left ventricle demonstrates global hypokinesis. The left ventricular internal cavity size was mildly dilated. There is mild concentric left ventricular hypertrophy. Left ventricular diastolic parameters are indeterminate. There is the interventricular septum is flattened in systole and diastole, consistent with right ventricular pressure and volume overload.  2. Right ventricular systolic function is mildly reduced. The right ventricular size is moderately enlarged. There is normal pulmonary artery systolic pressure. The estimated right ventricular systolic pressure is 32.8 mmHg.  3. Left atrial size was mildly dilated.  4. S/p Sorin Memo 3D ring annuloplasty (size 30mm, Catalog #[id number], serial [id number]). The mitral valve has been repaired/replaced. Mild mitral valve regurgitation. No evidence of mitral stenosis. The mean mitral valve gradient is 3.0 mmHg with average heart rate of 85 bpm. Procedure Date: 10/17/11. Echo findings are consistent with normal structure and function of the mitral valve prosthesis.  5. The tricuspid valve is abnormal. Tricuspid valve regurgitation is moderate.  6. The aortic valve is tricuspid. There is moderate calcification of the aortic valve. There is moderate thickening of the aortic valve. Aortic valve regurgitation is trivial. Aortic valve sclerosis/calcification is present, without any evidence of aortic stenosis.  7. The inferior vena cava is normal in size with <50% respiratory variability, suggesting right atrial pressure of 8 mmHg. FINDINGS  Left Ventricle: No LV thrombus by Definity . Left ventricular ejection fraction, by estimation, is 20 to 25%. The left ventricle has severely decreased function. The left ventricle demonstrates global  hypokinesis. Definity  contrast agent was given IV to delineate the left ventricular endocardial borders. Strain was performed and the global longitudinal strain is indeterminate. The left ventricular internal cavity size was mildly dilated. There is mild concentric left ventricular hypertrophy. The interventricular septum is flattened in systole and diastole, consistent with right ventricular pressure and volume overload. Left ventricular diastolic function could not be evaluated due to mitral valve repair. Left ventricular diastolic parameters are  indeterminate. Right Ventricle: The right ventricular size is moderately enlarged. No increase in right ventricular wall thickness. Right ventricular systolic function is mildly reduced. There is normal pulmonary artery systolic pressure. The tricuspid regurgitant velocity is 2.49 m/s, and with an assumed right atrial pressure of 8 mmHg, the estimated right ventricular systolic pressure is 32.8 mmHg. Left Atrium: Left atrial size was mildly dilated. Right Atrium: Right atrial size was normal in size. Pericardium: There is no evidence of pericardial effusion. Mitral Valve: S/p Sorin Memo 3D ring annuloplasty (size 30mm, Catalog #[id number], serial [id number]). The mitral valve has been repaired/replaced. Mild mitral valve regurgitation. There is a 30 mm Mitral Memo ring prosthetic annuloplasty ring present in the mitral position. Echo findings are consistent with normal structure and function of the mitral valve prosthesis. No evidence of mitral valve stenosis. MV peak gradient, 6.6 mmHg. The mean mitral valve gradient is 3.0 mmHg with average heart rate of 85 bpm. Tricuspid Valve: The tricuspid valve is abnormal. Tricuspid valve regurgitation is moderate. Aortic Valve: The aortic valve is tricuspid. There is moderate calcification of the aortic valve. There is moderate thickening of the aortic valve. Aortic valve regurgitation is trivial. Aortic valve sclerosis/calcification is  present, without any  evidence of aortic stenosis. Pulmonic Valve: The pulmonic valve was not well visualized. Pulmonic valve regurgitation is trivial. Aorta: The aortic root and ascending aorta are structurally normal, with no evidence of dilitation. Venous: The inferior vena cava is normal in size with less than 50% respiratory variability, suggesting right atrial pressure of 8 mmHg. IAS/Shunts: No atrial level shunt detected by color flow Doppler. Additional Comments: 3D was performed not requiring image post processing on an independent workstation and was indeterminate. A device lead is visualized in the right atrium and right ventricle.  LEFT VENTRICLE PLAX 2D LVIDd:         5.10 cm LVIDs:         4.80 cm LV PW:         1.20 cm LV IVS:        1.20 cm LVOT diam:     2.00 cm LV SV:         33 LV SV Index:   14 LVOT Area:     3.14 cm  LEFT ATRIUM              Index        RIGHT ATRIUM           Index LA diam:        4.60 cm  1.91 cm/m   RA Area:     18.60 cm LA Vol (A2C):   107.0 ml 44.47 ml/m  RA Volume:   51.50 ml  21.40 ml/m LA Vol (A4C):   79.8 ml  33.17 ml/m LA Biplane Vol: 93.3 ml  38.78 ml/m  AORTIC VALVE LVOT Vmax:   54.30 cm/s LVOT Vmean:  37.400 cm/s LVOT VTI:    0.104 m  AORTA Ao Root diam: 3.30 cm Ao Asc diam:  3.70 cm MITRAL VALVE             TRICUSPID VALVE MV Area VTI:  1.34 cm   TR Peak grad:   24.8 mmHg MV Peak grad: 6.6 mmHg   TR Vmax:        249.00 cm/s MV Mean grad: 3.0 mmHg MV Vmax:      1.29 m/s   SHUNTS MV Vmean:     80.7 cm/s  Systemic VTI:  0.10 m                          Systemic Diam: 2.00 cm Vishnu Priya Mallipeddi Electronically signed by Diannah Late Mallipeddi Signature Date/Time: 03/30/2024/11:31:43 AM    Final    DG Chest Portable 1 View Result Date: 03/29/2024 CLINICAL DATA:  Weakness.  Dizziness. EXAM: PORTABLE CHEST 1 VIEW COMPARISON:  10/15/2021. FINDINGS: Low lung volume. Mild diffuse pulmonary vascular congestion, likely accentuated by low lung volume. No frank  pulmonary edema. There are probable atelectatic changes at the lung bases. There is subtle blunting of bilateral lateral costophrenic angles, which may be due to trace pleural effusions versus pleural thickening. No pneumothorax on either side. Stable cardio-mediastinal silhouette. There are surgical staples along the heart border and sternotomy wires, status post CABG (coronary artery bypass graft). There is a left sided 3-lead pacemaker. Neurostimulator device noted overlying the lower thoracic spine region. No acute osseous abnormalities. The soft tissues are within normal limits. IMPRESSION: Mild diffuse pulmonary vascular congestion, likely accentuated by low lung volume. No frank pulmonary edema. Electronically Signed   By: Ree Molt M.D.   On: 03/29/2024 12:09    Alm Schneider, DO  [facility]  If 7PM-7AM, please contact night-coverage [email or web address] Password [email or web address] 04/19/2024, 1:19 PM   LOS: 3 days

## 2024-04-20 DIAGNOSIS — I5023 Acute on chronic systolic (congestive) heart failure: Secondary | ICD-10-CM | POA: Diagnosis not present

## 2024-04-20 DIAGNOSIS — Z95810 Presence of automatic (implantable) cardiac defibrillator: Secondary | ICD-10-CM | POA: Diagnosis not present

## 2024-04-20 DIAGNOSIS — N184 Chronic kidney disease, stage 4 (severe): Secondary | ICD-10-CM | POA: Diagnosis not present

## 2024-04-20 DIAGNOSIS — N178 Other acute kidney failure: Secondary | ICD-10-CM | POA: Diagnosis not present

## 2024-04-20 LAB — CBC WITH DIFFERENTIAL/PLATELET
Abs Immature Granulocytes: 0.02 K/uL (ref 0.00–0.07)
Basophils Absolute: 0 K/uL (ref 0.0–0.1)
Basophils Relative: 0 %
Eosinophils Absolute: 0 K/uL (ref 0.0–0.5)
Eosinophils Relative: 0 %
HCT: 38.5 % — ABNORMAL LOW (ref 39.0–52.0)
Hemoglobin: 12 g/dL — ABNORMAL LOW (ref 13.0–17.0)
Immature Granulocytes: 0 %
Lymphocytes Relative: 13 %
Lymphs Abs: 1.4 K/uL (ref 0.7–4.0)
MCH: 29.1 pg (ref 26.0–34.0)
MCHC: 31.2 g/dL (ref 30.0–36.0)
MCV: 93.2 fL (ref 80.0–100.0)
Monocytes Absolute: 1.3 K/uL — ABNORMAL HIGH (ref 0.1–1.0)
Monocytes Relative: 13 %
Neutro Abs: 7.4 K/uL (ref 1.7–7.7)
Neutrophils Relative %: 74 %
Platelets: 151 K/uL (ref 150–400)
RBC: 4.13 MIL/uL — ABNORMAL LOW (ref 4.22–5.81)
RDW: 15.2 % (ref 11.5–15.5)
WBC: 10.2 K/uL (ref 4.0–10.5)
nRBC: 0 % (ref 0.0–0.2)

## 2024-04-20 LAB — BASIC METABOLIC PANEL WITH GFR
Anion gap: 9 (ref 5–15)
BUN: 45 mg/dL — ABNORMAL HIGH (ref 8–23)
CO2: 27 mmol/L (ref 22–32)
Calcium: 8.3 mg/dL — ABNORMAL LOW (ref 8.9–10.3)
Chloride: 101 mmol/L (ref 98–111)
Creatinine, Ser: 2.54 mg/dL — ABNORMAL HIGH (ref 0.61–1.24)
GFR, Estimated: 24 mL/min — ABNORMAL LOW (ref >60)
Glucose, Bld: 139 mg/dL — ABNORMAL HIGH (ref 70–99)
Potassium: 4.3 mmol/L (ref 3.5–5.1)
Sodium: 137 mmol/L (ref 135–145)

## 2024-04-20 LAB — GLUCOSE, CAPILLARY
Glucose-Capillary: 133 mg/dL — ABNORMAL HIGH (ref 70–99)
Glucose-Capillary: 147 mg/dL — ABNORMAL HIGH (ref 70–99)
Glucose-Capillary: 209 mg/dL — ABNORMAL HIGH (ref 70–99)
Glucose-Capillary: 193 mg/dL — ABNORMAL HIGH (ref 70–99)
Glucose-Capillary: 226 mg/dL — ABNORMAL HIGH (ref 70–99)

## 2024-04-20 LAB — CK: Total CK: 519 U/L — ABNORMAL HIGH (ref 49–397)

## 2024-04-20 MED ORDER — POLYETHYLENE GLYCOL 3350 17 G PO PACK
17.0000 g | PACK | Freq: Every day | ORAL | Status: DC
  Administered 2024-04-20 – 2024-04-24 (×5): 17 g via ORAL
  Filled 2024-04-20 (×5): qty 1

## 2024-04-20 MED ORDER — SENNA 8.6 MG PO TABS
2.0000 | ORAL_TABLET | Freq: Every day | ORAL | Status: DC
  Administered 2024-04-20 – 2024-04-24 (×5): 17.2 mg via ORAL
  Filled 2024-04-20 (×5): qty 2

## 2024-04-20 NOTE — Plan of Care (Signed)

## 2024-04-20 NOTE — Progress Notes (Addendum)
 PROGRESS NOTE  Wayne Gordon FMW:[id number] DOB: 1933-12-22 DOA: 04/16/2024 PCP: Renato Dorothey HERO, NP  Brief History:  89 year old male with HFrEF, CAD status post CABG and mitral valve repair 2013, LVEF 20-25%, status post ICD, pacer placement, stage IV CKD, persistent atrial fibrillation/atrial flutter on chronic anticoagulation with apixaban  status post DCCV 84/25, ype 2 diabetes mellitus on insulin , with renal complications, hyperlipidemia, hypertension, coronary artery disease, status post mitral valve repair, recent hospitalizations, recently discharged 04/01/2024 with a discharge weight of 234 pounds at that time.  EP had performed a cardioversion on 8/4 for symptomatic atrial flutter.  The patient was admitted to the hospital from 03/29/2024 to 04/01/2024 for acute on chronic HFrEF.   He was discharged home on furosemide  40 mg p.o. daily.  Since hospital discharge, his weight has steadily climbed despite taking oral diuretics at home.  He is now up to 247 pounds.  He is increasingly becoming weaker.  He saw cardiology in the outpatient setting recently and was taken off amiodarone  04/05/24 due to patient report of arm and leg weakness.  He was brought into the emergency department because he continued to have swelling and weight gain in his legs and worsening shortness of breath.  He complained of severe fatigue and weakness and was unable to get out of bed this morning because he was so weak.  He was advised by the cardiology office to call EMS and come to the ED.  He was noted to be severely deconditioned and frail with significant peripheral edema, elevated BNP of 681, troponin 25, potassium 3.4, creatinine 2.75 and request was made for inpatient hospitalization for IV diuresis as he is failing outpatient diuresis with oral medication likely due to advanced renal failure.   Assessment/Plan: Acute on chronic HFrEF - 03/30/2024 echo EF 20-25%, global HK, mild decreased RVF, s/p MV  annuloplasty - Remains clinically fluid overloaded but improving sob - Continue IV furosemide --increased to 80 mg IV  TID - Cardiology consult appreciated - Daily weights--?accuracy - Accurate I's and O's--NEG 3.5L - 04/01/2024 discharge weight 234 lbs - Admission weight 247 lbs   Persistent atrial fibrillation - Status post DCCV 04/01/2024 - Continue carvedilol  - Continue apixaban  - Previously on amiodarone , but discontinued secondary to dizziness and generalized weakness   Acute on chronic CKD stage IV - Baseline creatinine 1.9-2.1 - Presented with serum creatinine 2.75 - Monitor with diuresis - Secondary to cardiorenal syndrome   Acute respiratory failure with hypoxia -he is now on 2L -CXR--retrocardiac airspace opacities   Coronary artery disease /elevated troponin - Status post CABG -Secondary to demand ischemia - No angina - holding  Crestor  temporarily due to elevated CK - Currently on apixaban    Diabetes mellitus type 2, uncontrolled with hyperglycemia - 03/29/2024 hemoglobin A1c 7.7 - Continue NovoLog  sliding scale - Continue Semglee  reduced dose - He is on Toujeo  26 units daily at home   Generalized weakness - Secondary to decompensated heart failure and electrolyte derangements and AKI - B12--1326 - Folic acid--19.7 - TSH--4.368 - PT evaluation>>SNF - UA no pyuria - CK 509>>690>>519   Hypokalemia - Replete - Check magnesium --2.4   Mixed hyperlipidemia - holding statin temporarily due to elevated CK   Sinus node dysfunction s/p ICD 2013 with upgrade of Biotronik BiV ICD 09/2020 - Device interrogation 02/2024: AP/VP rhythm with no treated arrhythmias - Continue to follow with EP      Family Communication:   spouse/daughter at bedside 8/23  Consultants:  cardiology   Code Status:  FULL    DVT Prophylaxis:  apixaban      Procedures: As Listed in Progress Note Above   Antibiotics: None          Subjective: Pt states breathing is slowly  improving.  Denies n/v/d, cp, f/c  Objective: Vitals:   04/20/24 0435 04/20/24 0846 04/20/24 0958 04/20/24 1424  BP:   114/66 103/70  Pulse:   78 80  Resp:    16  Temp:    98.2 F (36.8 C)  TempSrc:    Oral  SpO2:  90%  94%  Weight: 112.3 kg     Height:        Intake/Output Summary (Last 24 hours) at 04/20/2024 1630 Last data filed at 04/20/2024 1441 Gross per 24 hour  Intake 603 ml  Output 1950 ml  Net -1347 ml   Weight change: -2.7 kg Exam:  General:  Pt is alert, follows commands appropriately, not in acute distress HEENT: No icterus, No thrush, No neck mass, [place]/AT Cardiovascular: RRR, S1/S2, no rubs, no gallops Respiratory: bibasilar rales.  No wheeze Abdomen: Soft/+BS, non tender, non distended, no guarding Extremities: 1 + LE edema, No lymphangitis, No petechiae, No rashes, no synovitis   Data Reviewed: I have personally reviewed following labs and imaging studies Basic Metabolic Panel: Recent Labs  Lab 04/16/24 1029 04/17/24 0358 04/18/24 0410 04/19/24 0407 04/20/24 0341  NA 136 139 138 134* 137  K 3.4* 3.8 4.2 4.2 4.3  CL 99 102 101 98 101  CO2 26 24 26 24 27   GLUCOSE 130* 74 160* 162* 139*  BUN 36* 37* 42* 41* 45*  CREATININE 2.75* 2.60* 2.68* 2.62* 2.54*  CALCIUM  8.2* 8.4* 8.4* 8.5* 8.3*  MG  --  2.4 2.4 2.3  --    Liver Function Tests: Recent Labs  Lab 04/16/24 1029  AST 25  ALT 20  ALKPHOS 75  BILITOT 1.2  PROT 6.1*  ALBUMIN  3.2*   No results for input(s): LIPASE, AMYLASE in the last 168 hours. No results for input(s): AMMONIA in the last 168 hours. Coagulation Profile: No results for input(s): INR, PROTIME in the last 168 hours. CBC: Recent Labs  Lab 04/16/24 1029 04/17/24 0358 04/18/24 0410 04/19/24 0407 04/20/24 0341  WBC 10.8* 12.6* 11.1* 10.2 10.2  NEUTROABS 8.4* 9.5* 8.6* 7.7 7.4  HGB 13.1 13.5 12.8* 12.3* 12.0*  HCT 41.2 42.4 41.0 38.5* 38.5*  MCV 92.6 92.2 92.1 92.3 93.2  PLT 206 203 172 155 151   Cardiac  Enzymes: Recent Labs  Lab 04/18/24 0410 04/19/24 0407 04/20/24 0341  CKTOTAL 509* 690* 519*   BNP: Invalid input(s): POCBNP CBG: Recent Labs  Lab 04/19/24 2108 04/20/24 0307 04/20/24 0717 04/20/24 1115 04/20/24 1609  GLUCAP 157* 133* 147* 209* 193*   HbA1C: No results for input(s): HGBA1C in the last 72 hours. Urine analysis:    Component Value Date/Time   COLORURINE YELLOW 04/16/2024 1104   APPEARANCEUR CLEAR 04/16/2024 1104   LABSPEC 1.014 04/16/2024 1104   PHURINE 5.0 04/16/2024 1104   GLUCOSEU >=500 (A) 04/16/2024 1104   HGBUR NEGATIVE 04/16/2024 1104   BILIRUBINUR NEGATIVE 04/16/2024 1104   KETONESUR NEGATIVE 04/16/2024 1104   PROTEINUR NEGATIVE 04/16/2024 1104   UROBILINOGEN 0.2 02/04/2014 0939   NITRITE NEGATIVE 04/16/2024 1104   LEUKOCYTESUR NEGATIVE 04/16/2024 1104   Sepsis Labs: @LABRCNTIP (procalcitonin:4,lacticidven:4) ) Recent Results (from the past 240 hours)  Resp panel by RT-PCR (RSV, Flu A&B, Covid) Anterior Nasal Swab  Status: None   Collection Time: 04/16/24 11:04 AM   Specimen: Anterior Nasal Swab  Result Value Ref Range Status   SARS Coronavirus 2 by RT PCR NEGATIVE NEGATIVE Final    Comment: (NOTE) SARS-CoV-2 target nucleic acids are NOT DETECTED.  The SARS-CoV-2 RNA is generally detectable in upper respiratory specimens during the acute phase of infection. The lowest concentration of SARS-CoV-2 viral copies this assay can detect is 138 copies/mL. A negative result does not preclude SARS-Cov-2 infection and should not be used as the sole basis for treatment or other patient management decisions. A negative result may occur with  improper specimen collection/handling, submission of specimen other than nasopharyngeal swab, presence of viral mutation(s) within the areas targeted by this assay, and inadequate number of viral copies(<138 copies/mL). A negative result must be combined with clinical observations, patient history, and  epidemiological information. The expected result is Negative.  Fact Sheet for Patients:  BloggerCourse.com  Fact Sheet for Healthcare Providers:  SeriousBroker.it  This test is no t yet approved or cleared by the [place]  FDA and  has been authorized for detection and/or diagnosis of SARS-CoV-2 by FDA under an Emergency Use Authorization (EUA). This EUA will remain  in effect (meaning this test can be used) for the duration of the COVID-19 declaration under Section 564(b)(1) of the Act, 21 U.S.C.section 360bbb-3(b)(1), unless the authorization is terminated  or revoked sooner.       Influenza A by PCR NEGATIVE NEGATIVE Final   Influenza B by PCR NEGATIVE NEGATIVE Final    Comment: (NOTE) The Xpert Xpress SARS-CoV-2/FLU/RSV plus assay is intended as an aid in the diagnosis of influenza from Nasopharyngeal swab specimens and should not be used as a sole basis for treatment. Nasal washings and aspirates are unacceptable for Xpert Xpress SARS-CoV-2/FLU/RSV testing.  Fact Sheet for Patients: BloggerCourse.com  Fact Sheet for Healthcare Providers: SeriousBroker.it  This test is not yet approved or cleared by the [place]  FDA and has been authorized for detection and/or diagnosis of SARS-CoV-2 by FDA under an Emergency Use Authorization (EUA). This EUA will remain in effect (meaning this test can be used) for the duration of the COVID-19 declaration under Section 564(b)(1) of the Act, 21 U.S.C. section 360bbb-3(b)(1), unless the authorization is terminated or revoked.     Resp Syncytial Virus by PCR NEGATIVE NEGATIVE Final    Comment: (NOTE) Fact Sheet for Patients: BloggerCourse.com  Fact Sheet for Healthcare Providers: SeriousBroker.it  This test is not yet approved or cleared by the [place]  FDA and has been  authorized for detection and/or diagnosis of SARS-CoV-2 by FDA under an Emergency Use Authorization (EUA). This EUA will remain in effect (meaning this test can be used) for the duration of the COVID-19 declaration under Section 564(b)(1) of the Act, 21 U.S.C. section 360bbb-3(b)(1), unless the authorization is terminated or revoked.  Performed at [facility], [address] [place], [place] [address]   Respiratory (~20 pathogens) panel by PCR     Status: None   Collection Time: 04/18/24  4:55 PM   Specimen: Nasopharyngeal Swab; Respiratory  Result Value Ref Range Status   Adenovirus NOT DETECTED NOT DETECTED Final   Coronavirus 229E NOT DETECTED NOT DETECTED Final    Comment: (NOTE) The Coronavirus on the Respiratory Panel, DOES NOT test for the novel  Coronavirus (2019 nCoV)    Coronavirus HKU1 NOT DETECTED NOT DETECTED Final   Coronavirus NL63 NOT DETECTED NOT DETECTED Final   Coronavirus OC43 NOT DETECTED NOT DETECTED  Final   Metapneumovirus NOT DETECTED NOT DETECTED Final   Rhinovirus / Enterovirus NOT DETECTED NOT DETECTED Final   Influenza A NOT DETECTED NOT DETECTED Final   Influenza B NOT DETECTED NOT DETECTED Final   Parainfluenza Virus 1 NOT DETECTED NOT DETECTED Final   Parainfluenza Virus 2 NOT DETECTED NOT DETECTED Final   Parainfluenza Virus 3 NOT DETECTED NOT DETECTED Final   Parainfluenza Virus 4 NOT DETECTED NOT DETECTED Final   Respiratory Syncytial Virus NOT DETECTED NOT DETECTED Final   Bordetella pertussis NOT DETECTED NOT DETECTED Final   Bordetella Parapertussis NOT DETECTED NOT DETECTED Final   Chlamydophila pneumoniae NOT DETECTED NOT DETECTED Final   Mycoplasma pneumoniae NOT DETECTED NOT DETECTED Final    Comment: Performed at [facility], [address] [place], [place] [address]     Scheduled Meds:  apixaban   2.5 mg Oral BID   arformoterol   15 mcg Nebulization BID   budesonide  (PULMICORT ) nebulizer solution  0.5 mg Nebulization BID    carvedilol   12.5 mg Oral BID WC   dapagliflozin  propanediol  10 mg Oral QHS   furosemide   80 mg Intravenous TID   gabapentin   100 mg Oral QHS   insulin  aspart  0-9 Units Subcutaneous TID WC   insulin  glargine-yfgn  8 Units Subcutaneous QHS   pantoprazole   40 mg Oral QAC breakfast   polyethylene glycol  17 g Oral Daily   potassium chloride   40 mEq Oral BID   senna  2 tablet Oral Daily   sodium chloride  flush  3 mL Intravenous Q12H   Continuous Infusions:  Procedures/Studies: DG CHEST PORT 1 VIEW Result Date: 04/19/2024 CLINICAL DATA:  [id number] Acute respiratory failure with hypoxia [facility]) [id number] Pulmonary edema [id number] EXAM: PORTABLE CHEST - 1 VIEW COMPARISON:  April 16, 2024, March 29, 2024 FINDINGS: Elevation of the right hemidiaphragm with lower lung volumes on the right. The most inferior aspect of the left lung base are excluded from the field of view. Retrocardiac airspace opacities noted. No pneumothorax. Moderate cardiomegaly. Sternotomy wires and CABG markers. Left chest pacemaker with partially visualized leads in the region of the right atrium and right ventricle. Aortic atherosclerosis. No acute fracture or destructive lesions. Multilevel thoracic osteophytosis. IMPRESSION: The most inferior aspect of the left lung base and costophrenic sulcus are excluded from the field of view. Retrocardiac airspace opacities, may reflect atelectasis, bronchopneumonia, or asymmetric pulmonary edema. Electronically Signed   By: Rogelia Myers M.D.   On: 04/19/2024 17:22   DG Chest Portable 1 View Result Date: 04/16/2024 CLINICAL DATA:  Weakness. EXAM: PORTABLE CHEST 1 VIEW COMPARISON:  March 29, 2024. FINDINGS: Stable cardiomegaly. Sternotomy wires are noted. Left-sided defibrillator is unchanged. Elevated right hemidiaphragm is noted. Left lung base is not completely included in field-of-view. Visualized lung parenchyma is unremarkable. Bony thorax is unremarkable. IMPRESSION: Left lung base is  not completely included in field-of-view. No definite abnormality seen in the visualized lung parenchyma. Electronically Signed   By: Lynwood Landy Raddle M.D.   On: 04/16/2024 12:06   EP STUDY Result Date: 04/01/2024 See surgical note for result.  ECHOCARDIOGRAM COMPLETE Result Date: 03/30/2024    ECHOCARDIOGRAM REPORT   Patient Name:   Wayne Gordon Date of Exam: 03/30/2024 Medical Rec #:  [id number]      Height:       76.0 in Accession #:    [id number]     Weight:       242.9 lb Date of Birth:  02-20-1934       BSA:          2.406 m Patient Age:    89 years       BP:           106/66 mmHg Patient Gender: M              HR:           80 bpm. Exam Location:  Inpatient Procedure: 2D Echo, Cardiac Doppler, Color Doppler and Intracardiac            Opacification Agent (Both Spectral and Color Flow Doppler were            utilized during procedure). Indications:    I50.21 Acute systolic (congestive) heart failure  History:        Patient has prior history of Echocardiogram examinations, most                 recent 10/15/2021. CHF, CAD, Prior CABG and Defibrillator,                 Arrythmias:Atrial Fibrillation; Risk Factors:Hypertension and                 Diabetes.  Sonographer:    Damien Senior RDCS Referring Phys: [phone] JACOB J STINSON IMPRESSIONS  1. No LV thrombus by Definity . Left ventricular ejection fraction, by estimation, is 20 to 25%. The left ventricle has severely decreased function. The left ventricle demonstrates global hypokinesis. The left ventricular internal cavity size was mildly dilated. There is mild concentric left ventricular hypertrophy. Left ventricular diastolic parameters are indeterminate. There is the interventricular septum is flattened in systole and diastole, consistent with right ventricular pressure and volume overload.  2. Right ventricular systolic function is mildly reduced. The right ventricular size is moderately enlarged. There is normal pulmonary artery systolic pressure. The  estimated right ventricular systolic pressure is 32.8 mmHg.  3. Left atrial size was mildly dilated.  4. S/p Sorin Memo 3D ring annuloplasty (size 30mm, Catalog #[id number], serial [id number]). The mitral valve has been repaired/replaced. Mild mitral valve regurgitation. No evidence of mitral stenosis. The mean mitral valve gradient is 3.0 mmHg with average heart rate of 85 bpm. Procedure Date: 10/17/11. Echo findings are consistent with normal structure and function of the mitral valve prosthesis.  5. The tricuspid valve is abnormal. Tricuspid valve regurgitation is moderate.  6. The aortic valve is tricuspid. There is moderate calcification of the aortic valve. There is moderate thickening of the aortic valve. Aortic valve regurgitation is trivial. Aortic valve sclerosis/calcification is present, without any evidence of aortic stenosis.  7. The inferior vena cava is normal in size with <50% respiratory variability, suggesting right atrial pressure of 8 mmHg. FINDINGS  Left Ventricle: No LV thrombus by Definity . Left ventricular ejection fraction, by estimation, is 20 to 25%. The left ventricle has severely decreased function. The left ventricle demonstrates global hypokinesis. Definity  contrast agent was given IV to delineate the left ventricular endocardial borders. Strain was performed and the global longitudinal strain is indeterminate. The left ventricular internal cavity size was mildly dilated. There is mild concentric left ventricular hypertrophy. The interventricular septum is flattened in systole and diastole, consistent with right ventricular pressure and volume overload. Left ventricular diastolic function could not be evaluated due to mitral valve repair. Left ventricular diastolic parameters are  indeterminate. Right Ventricle: The right ventricular size is moderately enlarged. No increase in right ventricular wall thickness. Right ventricular systolic function is mildly  reduced. There is normal pulmonary  artery systolic pressure. The tricuspid regurgitant velocity is 2.49 m/s, and with an assumed right atrial pressure of 8 mmHg, the estimated right ventricular systolic pressure is 32.8 mmHg. Left Atrium: Left atrial size was mildly dilated. Right Atrium: Right atrial size was normal in size. Pericardium: There is no evidence of pericardial effusion. Mitral Valve: S/p Sorin Memo 3D ring annuloplasty (size 30mm, Catalog #[id number], serial [id number]). The mitral valve has been repaired/replaced. Mild mitral valve regurgitation. There is a 30 mm Mitral Memo ring prosthetic annuloplasty ring present in the mitral position. Echo findings are consistent with normal structure and function of the mitral valve prosthesis. No evidence of mitral valve stenosis. MV peak gradient, 6.6 mmHg. The mean mitral valve gradient is 3.0 mmHg with average heart rate of 85 bpm. Tricuspid Valve: The tricuspid valve is abnormal. Tricuspid valve regurgitation is moderate. Aortic Valve: The aortic valve is tricuspid. There is moderate calcification of the aortic valve. There is moderate thickening of the aortic valve. Aortic valve regurgitation is trivial. Aortic valve sclerosis/calcification is present, without any evidence of aortic stenosis. Pulmonic Valve: The pulmonic valve was not well visualized. Pulmonic valve regurgitation is trivial. Aorta: The aortic root and ascending aorta are structurally normal, with no evidence of dilitation. Venous: The inferior vena cava is normal in size with less than 50% respiratory variability, suggesting right atrial pressure of 8 mmHg. IAS/Shunts: No atrial level shunt detected by color flow Doppler. Additional Comments: 3D was performed not requiring image post processing on an independent workstation and was indeterminate. A device lead is visualized in the right atrium and right ventricle.  LEFT VENTRICLE PLAX 2D LVIDd:         5.10 cm LVIDs:         4.80 cm LV PW:         1.20 cm LV IVS:        1.20 cm  LVOT diam:     2.00 cm LV SV:         33 LV SV Index:   14 LVOT Area:     3.14 cm  LEFT ATRIUM              Index        RIGHT ATRIUM           Index LA diam:        4.60 cm  1.91 cm/m   RA Area:     18.60 cm LA Vol (A2C):   107.0 ml 44.47 ml/m  RA Volume:   51.50 ml  21.40 ml/m LA Vol (A4C):   79.8 ml  33.17 ml/m LA Biplane Vol: 93.3 ml  38.78 ml/m  AORTIC VALVE LVOT Vmax:   54.30 cm/s LVOT Vmean:  37.400 cm/s LVOT VTI:    0.104 m  AORTA Ao Root diam: 3.30 cm Ao Asc diam:  3.70 cm MITRAL VALVE             TRICUSPID VALVE MV Area VTI:  1.34 cm   TR Peak grad:   24.8 mmHg MV Peak grad: 6.6 mmHg   TR Vmax:        249.00 cm/s MV Mean grad: 3.0 mmHg MV Vmax:      1.29 m/s   SHUNTS MV Vmean:     80.7 cm/s  Systemic VTI:  0.10 m  Systemic Diam: 2.00 cm Vishnu Priya Mallipeddi Electronically signed by Diannah Late Mallipeddi Signature Date/Time: 03/30/2024/11:31:43 AM    Final    DG Chest Portable 1 View Result Date: 03/29/2024 CLINICAL DATA:  Weakness.  Dizziness. EXAM: PORTABLE CHEST 1 VIEW COMPARISON:  10/15/2021. FINDINGS: Low lung volume. Mild diffuse pulmonary vascular congestion, likely accentuated by low lung volume. No frank pulmonary edema. There are probable atelectatic changes at the lung bases. There is subtle blunting of bilateral lateral costophrenic angles, which may be due to trace pleural effusions versus pleural thickening. No pneumothorax on either side. Stable cardio-mediastinal silhouette. There are surgical staples along the heart border and sternotomy wires, status post CABG (coronary artery bypass graft). There is a left sided 3-lead pacemaker. Neurostimulator device noted overlying the lower thoracic spine region. No acute osseous abnormalities. The soft tissues are within normal limits. IMPRESSION: Mild diffuse pulmonary vascular congestion, likely accentuated by low lung volume. No frank pulmonary edema. Electronically Signed   By: Ree Molt M.D.   On:  03/29/2024 12:09    Alm Schneider, DO  [facility]  If 7PM-7AM, please contact night-coverage [email or web address] Password [email or web address] 04/20/2024, 4:30 PM   LOS: 4 days

## 2024-04-21 ENCOUNTER — Ambulatory Visit: Payer: Self-pay | Admitting: Nurse Practitioner

## 2024-04-21 DIAGNOSIS — Z95810 Presence of automatic (implantable) cardiac defibrillator: Secondary | ICD-10-CM | POA: Diagnosis not present

## 2024-04-21 DIAGNOSIS — I5023 Acute on chronic systolic (congestive) heart failure: Secondary | ICD-10-CM | POA: Diagnosis not present

## 2024-04-21 DIAGNOSIS — I4892 Unspecified atrial flutter: Secondary | ICD-10-CM | POA: Diagnosis not present

## 2024-04-21 LAB — BASIC METABOLIC PANEL WITH GFR
Anion gap: 9 (ref 5–15)
BUN: 49 mg/dL — ABNORMAL HIGH (ref 8–23)
CO2: 26 mmol/L (ref 22–32)
Calcium: 8.3 mg/dL — ABNORMAL LOW (ref 8.9–10.3)
Chloride: 101 mmol/L (ref 98–111)
Creatinine, Ser: 2.65 mg/dL — ABNORMAL HIGH (ref 0.61–1.24)
GFR, Estimated: 22 mL/min — ABNORMAL LOW (ref >60)
Glucose, Bld: 165 mg/dL — ABNORMAL HIGH (ref 70–99)
Potassium: 4.8 mmol/L (ref 3.5–5.1)
Sodium: 136 mmol/L (ref 135–145)

## 2024-04-21 LAB — CK: Total CK: 348 U/L (ref 49–397)

## 2024-04-21 LAB — CBC
HCT: 40.5 % (ref 39.0–52.0)
Hemoglobin: 12.7 g/dL — ABNORMAL LOW (ref 13.0–17.0)
MCH: 29.2 pg (ref 26.0–34.0)
MCHC: 31.4 g/dL (ref 30.0–36.0)
MCV: 93.1 fL (ref 80.0–100.0)
Platelets: 145 K/uL — ABNORMAL LOW (ref 150–400)
RBC: 4.35 MIL/uL (ref 4.22–5.81)
RDW: 15.3 % (ref 11.5–15.5)
WBC: 11.8 K/uL — ABNORMAL HIGH (ref 4.0–10.5)
nRBC: 0 % (ref 0.0–0.2)

## 2024-04-21 LAB — GLUCOSE, CAPILLARY
Glucose-Capillary: 182 mg/dL — ABNORMAL HIGH (ref 70–99)
Glucose-Capillary: 154 mg/dL — ABNORMAL HIGH (ref 70–99)
Glucose-Capillary: 200 mg/dL — ABNORMAL HIGH (ref 70–99)
Glucose-Capillary: 205 mg/dL — ABNORMAL HIGH (ref 70–99)
Glucose-Capillary: 167 mg/dL — ABNORMAL HIGH (ref 70–99)

## 2024-04-21 LAB — PROCALCITONIN: Procalcitonin: 0.49 ng/mL

## 2024-04-21 LAB — MAGNESIUM: Magnesium: 2.5 mg/dL — ABNORMAL HIGH (ref 1.7–2.4)

## 2024-04-21 MED ORDER — TORSEMIDE 20 MG PO TABS
40.0000 mg | ORAL_TABLET | Freq: Every day | ORAL | Status: DC
  Administered 2024-04-21: 40 mg via ORAL
  Filled 2024-04-21 (×2): qty 2

## 2024-04-21 NOTE — Plan of Care (Signed)

## 2024-04-21 NOTE — Plan of Care (Signed)
 Problem: Education: Goal: Ability to describe self-care measures that may prevent or decrease complications (Diabetes Survival Skills Education) will improve 04/21/2024 1948 by Olene Corean CROME, RN Outcome: Progressing 04/21/2024 1948 by Olene Corean CROME, RN Outcome: Progressing Goal: Individualized Educational Video(s) 04/21/2024 1948 by Olene Corean CROME, RN Outcome: Progressing 04/21/2024 1948 by Olene Corean CROME, RN Outcome: Progressing   Problem: Coping: Goal: Ability to adjust to condition or change in health will improve 04/21/2024 1948 by Olene Corean CROME, RN Outcome: Progressing 04/21/2024 1948 by Olene Corean CROME, RN Outcome: Progressing   Problem: Fluid Volume: Goal: Ability to maintain a balanced intake and output will improve 04/21/2024 1948 by Olene Corean CROME, RN Outcome: Progressing 04/21/2024 1948 by Olene Corean CROME, RN Outcome: Progressing   Problem: Health Behavior/Discharge Planning: Goal: Ability to identify and utilize available resources and services will improve 04/21/2024 1948 by Olene Corean CROME, RN Outcome: Progressing 04/21/2024 1948 by Olene Corean CROME, RN Outcome: Progressing Goal: Ability to manage health-related needs will improve 04/21/2024 1948 by Olene Corean CROME, RN Outcome: Progressing 04/21/2024 1948 by Olene Corean CROME, RN Outcome: Progressing   Problem: Metabolic: Goal: Ability to maintain appropriate glucose levels will improve 04/21/2024 1948 by Olene Corean CROME, RN Outcome: Progressing 04/21/2024 1948 by Olene Corean CROME, RN Outcome: Progressing   Problem: Nutritional: Goal: Maintenance of adequate nutrition will improve 04/21/2024 1948 by Olene Corean CROME, RN Outcome: Progressing 04/21/2024 1948 by Olene Corean CROME, RN Outcome: Progressing Goal: Progress toward achieving an optimal weight will improve 04/21/2024 1948 by Olene Corean CROME, RN Outcome: Progressing 04/21/2024 1948 by Olene Corean CROME, RN Outcome: Progressing   Problem: Skin Integrity: Goal: Risk for impaired skin integrity will decrease 04/21/2024 1948 by Olene Corean CROME, RN Outcome: Progressing 04/21/2024 1948 by Olene Corean CROME, RN Outcome: Progressing   Problem: Tissue Perfusion: Goal: Adequacy of tissue perfusion will improve 04/21/2024 1948 by Olene Corean CROME, RN Outcome: Progressing 04/21/2024 1948 by Olene Corean CROME, RN Outcome: Progressing   Problem: Education: Goal: Knowledge of General Education information will improve Description: Including pain rating scale, medication(s)/side effects and non-pharmacologic comfort measures 04/21/2024 1948 by Olene Corean CROME, RN Outcome: Progressing 04/21/2024 1948 by Olene Corean CROME, RN Outcome: Progressing   Problem: Health Behavior/Discharge Planning: Goal: Ability to manage health-related needs will improve 04/21/2024 1948 by Olene Corean CROME, RN Outcome: Progressing 04/21/2024 1948 by Olene Corean CROME, RN Outcome: Progressing   Problem: Clinical Measurements: Goal: Ability to maintain clinical measurements within normal limits will improve 04/21/2024 1948 by Olene Corean CROME, RN Outcome: Progressing 04/21/2024 1948 by Olene Corean CROME, RN Outcome: Progressing Goal: Will remain free from infection 04/21/2024 1948 by Olene Corean CROME, RN Outcome: Progressing 04/21/2024 1948 by Olene Corean CROME, RN Outcome: Progressing Goal: Diagnostic test results will improve 04/21/2024 1948 by Olene Corean CROME, RN Outcome: Progressing 04/21/2024 1948 by Olene Corean CROME, RN Outcome: Progressing Goal: Respiratory complications will improve 04/21/2024 1948 by Olene Corean CROME, RN Outcome: Progressing 04/21/2024 1948 by Olene Corean CROME, RN Outcome: Progressing Goal: Cardiovascular complication will be  avoided 04/21/2024 1948 by Olene Corean CROME, RN Outcome: Progressing 04/21/2024 1948 by Olene Corean CROME, RN Outcome: Progressing   Problem: Activity: Goal: Risk for activity intolerance will decrease 04/21/2024 1948 by Olene Corean CROME, RN Outcome: Progressing 04/21/2024 1948 by Olene Corean CROME, RN Outcome: Progressing   Problem: Nutrition: Goal: Adequate nutrition will be maintained 04/21/2024 1948 by Olene Corean CROME, RN Outcome: Progressing 04/21/2024 1948 by Olene Corean CROME, RN Outcome: Progressing  Problem: Coping: Goal: Level of anxiety will decrease 04/21/2024 1948 by Olene Corean CROME, RN Outcome: Progressing 04/21/2024 1948 by Olene Corean CROME, RN Outcome: Progressing   Problem: Elimination: Goal: Will not experience complications related to bowel motility 04/21/2024 1948 by Olene Corean CROME, RN Outcome: Progressing 04/21/2024 1948 by Olene Corean CROME, RN Outcome: Progressing Goal: Will not experience complications related to urinary retention 04/21/2024 1948 by Olene Corean CROME, RN Outcome: Progressing 04/21/2024 1948 by Olene Corean CROME, RN Outcome: Progressing   Problem: Pain Managment: Goal: General experience of comfort will improve and/or be controlled 04/21/2024 1948 by Olene Corean CROME, RN Outcome: Progressing 04/21/2024 1948 by Olene Corean CROME, RN Outcome: Progressing   Problem: Safety: Goal: Ability to remain free from injury will improve 04/21/2024 1948 by Olene Corean CROME, RN Outcome: Progressing 04/21/2024 1948 by Olene Corean CROME, RN Outcome: Progressing   Problem: Skin Integrity: Goal: Risk for impaired skin integrity will decrease 04/21/2024 1948 by Olene Corean CROME, RN Outcome: Progressing 04/21/2024 1948 by Olene Corean CROME, RN Outcome: Progressing

## 2024-04-21 NOTE — Progress Notes (Signed)
 PROGRESS NOTE  Wayne Gordon FMW:[id number] DOB: April 20, 1934 DOA: 04/16/2024 PCP: Renato Dorothey HERO, NP  Brief History:  89 year old male with HFrEF, CAD status post CABG and mitral valve repair 2013, LVEF 20-25%, status post ICD, pacer placement, stage IV CKD, persistent atrial fibrillation/atrial flutter on chronic anticoagulation with apixaban  status post DCCV 84/25, ype 2 diabetes mellitus on insulin , with renal complications, hyperlipidemia, hypertension, coronary artery disease, status post mitral valve repair, recent hospitalizations, recently discharged 04/01/2024 with a discharge weight of 234 pounds at that time.  EP had performed a cardioversion on 8/4 for symptomatic atrial flutter.  The patient was admitted to the hospital from 03/29/2024 to 04/01/2024 for acute on chronic HFrEF.   He was discharged home on furosemide  40 mg p.o. daily.  Since hospital discharge, his weight has steadily climbed despite taking oral diuretics at home.  He is now up to 247 pounds.  He is increasingly becoming weaker.  He saw cardiology in the outpatient setting recently and was taken off amiodarone  04/05/24 due to patient report of arm and leg weakness.  He was brought into the emergency department because he continued to have swelling and weight gain in his legs and worsening shortness of breath.  He complained of severe fatigue and weakness and was unable to get out of bed this morning because he was so weak.  He was advised by the cardiology office to call EMS and come to the ED.  He was noted to be severely deconditioned and frail with significant peripheral edema, elevated BNP of 681, troponin 25, potassium 3.4, creatinine 2.75 and request was made for inpatient hospitalization for IV diuresis as he is failing outpatient diuresis with oral medication in part due to advanced renal failure.   Assessment/Plan:  Acute on chronic HFrEF - 03/30/2024 echo EF 20-25%, global HK, mild decreased RVF, s/p MV  annuloplasty - Remains clinically fluid overloaded but improving sob - Continue IV furosemide --increased to 80 mg IV  TID - 8/24--transition to po torsemide  - Cardiology consult appreciated - Daily weights--?accuracy - Accurate I's and O's--NEG 5L - 04/01/2024 discharge weight 234 lbs - Admission weight 247 lbs   Persistent atrial fibrillation - Status post DCCV 04/01/2024 - Continue carvedilol  - Continue apixaban  - Previously on amiodarone , but discontinued secondary to dizziness and generalized weakness - remains rate controlled   Acute on chronic CKD stage IV - Baseline creatinine 1.9-2.1 - Presented with serum creatinine 2.75 - Monitor with diuresis - Secondary to cardiorenal syndrome   Acute respiratory failure with hypoxia -he is now on 2L -CXR--retrocardiac airspace opacities -PCT 0.49   Coronary artery disease /elevated troponin - Status post CABG -Secondary to demand ischemia - No angina - holding  Crestor  temporarily due to elevated CK - Currently on apixaban    Diabetes mellitus type 2, uncontrolled with hyperglycemia - 03/29/2024 hemoglobin A1c 7.7 - Continue NovoLog  sliding scale - Continue Semglee  reduced dose - He is on Toujeo  26 units daily at home   Generalized weakness - Secondary to decompensated heart failure and electrolyte derangements and AKI - B12--1326 - Folic acid--19.7 - TSH--4.368 - PT evaluation>>SNF - UA no pyuria - CK 509>>690>>519   Hypokalemia - Replete - Check magnesium --2.4   Mixed hyperlipidemia - holding statin temporarily due to elevated CK   Sinus node dysfunction s/p ICD 2013 with upgrade of Biotronik BiV ICD 09/2020 - Device interrogation 02/2024: AP/VP rhythm with no treated arrhythmias - Continue to follow with EP  Family Communication:   spouse/daughter at bedside 8/23   Consultants:  cardiology   Code Status:  FULL    DVT Prophylaxis:  apixaban      Procedures: As Listed in Progress Note Above    Antibiotics: None       Subjective: Patient denies fevers, chills, headache, chest pain, dyspnea, nausea, vomiting, diarrhea, abdominal pain, dysuria, hematuria, hematochezia, and melena. Pt had BM>>abd feels better  Objective: Vitals:   04/20/24 1939 04/20/24 2035 04/21/24 0400 04/21/24 0500  BP:  111/62 (!) 106/58   Pulse:  (!) 55 74   Resp:  16 17   Temp:  98 F (36.7 C) 98.1 F (36.7 C)   TempSrc:  Oral Oral   SpO2: 95% 94% 95%   Weight:    114.1 kg  Height:        Intake/Output Summary (Last 24 hours) at 04/21/2024 1333 Last data filed at 04/21/2024 0800 Gross per 24 hour  Intake 243 ml  Output 1600 ml  Net -1357 ml   Weight change: 1.8 kg Exam:  General:  Pt is alert, follows commands appropriately, not in acute distress HEENT: No icterus, No thrush, No neck mass, [place]/AT Cardiovascular: RRR, S1/S2, no rubs, no gallops Respiratory: left basilar crackles. No wheeze Abdomen: Soft/+BS, non tender, non distended, no guarding Extremities: 1 + LE edema, No lymphangitis, No petechiae, No rashes, no synovitis   Data Reviewed: I have personally reviewed following labs and imaging studies Basic Metabolic Panel: Recent Labs  Lab 04/17/24 0358 04/18/24 0410 04/19/24 0407 04/20/24 0341 04/21/24 0322  NA 139 138 134* 137 136  K 3.8 4.2 4.2 4.3 4.8  CL 102 101 98 101 101  CO2 24 26 24 27 26   GLUCOSE 74 160* 162* 139* 165*  BUN 37* 42* 41* 45* 49*  CREATININE 2.60* 2.68* 2.62* 2.54* 2.65*  CALCIUM  8.4* 8.4* 8.5* 8.3* 8.3*  MG 2.4 2.4 2.3  --  2.5*   Liver Function Tests: Recent Labs  Lab 04/16/24 1029  AST 25  ALT 20  ALKPHOS 75  BILITOT 1.2  PROT 6.1*  ALBUMIN  3.2*   No results for input(s): LIPASE, AMYLASE in the last 168 hours. No results for input(s): AMMONIA in the last 168 hours. Coagulation Profile: No results for input(s): INR, PROTIME in the last 168 hours. CBC: Recent Labs  Lab 04/16/24 1029 04/17/24 0358 04/18/24 0410  04/19/24 0407 04/20/24 0341 04/21/24 0322  WBC 10.8* 12.6* 11.1* 10.2 10.2 11.8*  NEUTROABS 8.4* 9.5* 8.6* 7.7 7.4  --   HGB 13.1 13.5 12.8* 12.3* 12.0* 12.7*  HCT 41.2 42.4 41.0 38.5* 38.5* 40.5  MCV 92.6 92.2 92.1 92.3 93.2 93.1  PLT 206 203 172 155 151 145*   Cardiac Enzymes: Recent Labs  Lab 04/18/24 0410 04/19/24 0407 04/20/24 0341 04/21/24 0322  CKTOTAL 509* 690* 519* 348   BNP: Invalid input(s): POCBNP CBG: Recent Labs  Lab 04/20/24 1609 04/20/24 2033 04/21/24 0357 04/21/24 0729 04/21/24 1057  GLUCAP 193* 226* 182* 154* 200*   HbA1C: No results for input(s): HGBA1C in the last 72 hours. Urine analysis:    Component Value Date/Time   COLORURINE YELLOW 04/16/2024 1104   APPEARANCEUR CLEAR 04/16/2024 1104   LABSPEC 1.014 04/16/2024 1104   PHURINE 5.0 04/16/2024 1104   GLUCOSEU >=500 (A) 04/16/2024 1104   HGBUR NEGATIVE 04/16/2024 1104   BILIRUBINUR NEGATIVE 04/16/2024 1104   KETONESUR NEGATIVE 04/16/2024 1104   PROTEINUR NEGATIVE 04/16/2024 1104   UROBILINOGEN 0.2 02/04/2014 0939  NITRITE NEGATIVE 04/16/2024 1104   LEUKOCYTESUR NEGATIVE 04/16/2024 1104   Sepsis Labs: @LABRCNTIP (procalcitonin:4,lacticidven:4) ) Recent Results (from the past 240 hours)  Resp panel by RT-PCR (RSV, Flu A&B, Covid) Anterior Nasal Swab     Status: None   Collection Time: 04/16/24 11:04 AM   Specimen: Anterior Nasal Swab  Result Value Ref Range Status   SARS Coronavirus 2 by RT PCR NEGATIVE NEGATIVE Final    Comment: (NOTE) SARS-CoV-2 target nucleic acids are NOT DETECTED.  The SARS-CoV-2 RNA is generally detectable in upper respiratory specimens during the acute phase of infection. The lowest concentration of SARS-CoV-2 viral copies this assay can detect is 138 copies/mL. A negative result does not preclude SARS-Cov-2 infection and should not be used as the sole basis for treatment or other patient management decisions. A negative result may occur with  improper  specimen collection/handling, submission of specimen other than nasopharyngeal swab, presence of viral mutation(s) within the areas targeted by this assay, and inadequate number of viral copies(<138 copies/mL). A negative result must be combined with clinical observations, patient history, and epidemiological information. The expected result is Negative.  Fact Sheet for Patients:  BloggerCourse.com  Fact Sheet for Healthcare Providers:  SeriousBroker.it  This test is no t yet approved or cleared by the [place]  FDA and  has been authorized for detection and/or diagnosis of SARS-CoV-2 by FDA under an Emergency Use Authorization (EUA). This EUA will remain  in effect (meaning this test can be used) for the duration of the COVID-19 declaration under Section 564(b)(1) of the Act, 21 U.S.C.section 360bbb-3(b)(1), unless the authorization is terminated  or revoked sooner.       Influenza A by PCR NEGATIVE NEGATIVE Final   Influenza B by PCR NEGATIVE NEGATIVE Final    Comment: (NOTE) The Xpert Xpress SARS-CoV-2/FLU/RSV plus assay is intended as an aid in the diagnosis of influenza from Nasopharyngeal swab specimens and should not be used as a sole basis for treatment. Nasal washings and aspirates are unacceptable for Xpert Xpress SARS-CoV-2/FLU/RSV testing.  Fact Sheet for Patients: BloggerCourse.com  Fact Sheet for Healthcare Providers: SeriousBroker.it  This test is not yet approved or cleared by the [place]  FDA and has been authorized for detection and/or diagnosis of SARS-CoV-2 by FDA under an Emergency Use Authorization (EUA). This EUA will remain in effect (meaning this test can be used) for the duration of the COVID-19 declaration under Section 564(b)(1) of the Act, 21 U.S.C. section 360bbb-3(b)(1), unless the authorization is terminated or revoked.     Resp  Syncytial Virus by PCR NEGATIVE NEGATIVE Final    Comment: (NOTE) Fact Sheet for Patients: BloggerCourse.com  Fact Sheet for Healthcare Providers: SeriousBroker.it  This test is not yet approved or cleared by the [place]  FDA and has been authorized for detection and/or diagnosis of SARS-CoV-2 by FDA under an Emergency Use Authorization (EUA). This EUA will remain in effect (meaning this test can be used) for the duration of the COVID-19 declaration under Section 564(b)(1) of the Act, 21 U.S.C. section 360bbb-3(b)(1), unless the authorization is terminated or revoked.  Performed at [facility], [address] [place], [place] [address]   Respiratory (~20 pathogens) panel by PCR     Status: None   Collection Time: 04/18/24  4:55 PM   Specimen: Nasopharyngeal Swab; Respiratory  Result Value Ref Range Status   Adenovirus NOT DETECTED NOT DETECTED Final   Coronavirus 229E NOT DETECTED NOT DETECTED Final    Comment: (NOTE) The Coronavirus on the  Respiratory Panel, DOES NOT test for the novel  Coronavirus (2019 nCoV)    Coronavirus HKU1 NOT DETECTED NOT DETECTED Final   Coronavirus NL63 NOT DETECTED NOT DETECTED Final   Coronavirus OC43 NOT DETECTED NOT DETECTED Final   Metapneumovirus NOT DETECTED NOT DETECTED Final   Rhinovirus / Enterovirus NOT DETECTED NOT DETECTED Final   Influenza A NOT DETECTED NOT DETECTED Final   Influenza B NOT DETECTED NOT DETECTED Final   Parainfluenza Virus 1 NOT DETECTED NOT DETECTED Final   Parainfluenza Virus 2 NOT DETECTED NOT DETECTED Final   Parainfluenza Virus 3 NOT DETECTED NOT DETECTED Final   Parainfluenza Virus 4 NOT DETECTED NOT DETECTED Final   Respiratory Syncytial Virus NOT DETECTED NOT DETECTED Final   Bordetella pertussis NOT DETECTED NOT DETECTED Final   Bordetella Parapertussis NOT DETECTED NOT DETECTED Final   Chlamydophila pneumoniae NOT DETECTED NOT DETECTED Final    Mycoplasma pneumoniae NOT DETECTED NOT DETECTED Final    Comment: Performed at [facility], [address] [place], [place] [address]     Scheduled Meds:  apixaban   2.5 mg Oral BID   arformoterol   15 mcg Nebulization BID   budesonide  (PULMICORT ) nebulizer solution  0.5 mg Nebulization BID   carvedilol   12.5 mg Oral BID WC   dapagliflozin  propanediol  10 mg Oral QHS   gabapentin   100 mg Oral QHS   insulin  aspart  0-9 Units Subcutaneous TID WC   insulin  glargine-yfgn  8 Units Subcutaneous QHS   pantoprazole   40 mg Oral QAC breakfast   polyethylene glycol  17 g Oral Daily   senna  2 tablet Oral Daily   sodium chloride  flush  3 mL Intravenous Q12H   torsemide   40 mg Oral Daily   Continuous Infusions:  Procedures/Studies: DG CHEST PORT 1 VIEW Result Date: 04/19/2024 CLINICAL DATA:  [id number] Acute respiratory failure with hypoxia [facility]) [id number] Pulmonary edema [id number] EXAM: PORTABLE CHEST - 1 VIEW COMPARISON:  April 16, 2024, March 29, 2024 FINDINGS: Elevation of the right hemidiaphragm with lower lung volumes on the right. The most inferior aspect of the left lung base are excluded from the field of view. Retrocardiac airspace opacities noted. No pneumothorax. Moderate cardiomegaly. Sternotomy wires and CABG markers. Left chest pacemaker with partially visualized leads in the region of the right atrium and right ventricle. Aortic atherosclerosis. No acute fracture or destructive lesions. Multilevel thoracic osteophytosis. IMPRESSION: The most inferior aspect of the left lung base and costophrenic sulcus are excluded from the field of view. Retrocardiac airspace opacities, may reflect atelectasis, bronchopneumonia, or asymmetric pulmonary edema. Electronically Signed   By: Rogelia Myers M.D.   On: 04/19/2024 17:22   DG Chest Portable 1 View Result Date: 04/16/2024 CLINICAL DATA:  Weakness. EXAM: PORTABLE CHEST 1 VIEW COMPARISON:  March 29, 2024. FINDINGS: Stable cardiomegaly.  Sternotomy wires are noted. Left-sided defibrillator is unchanged. Elevated right hemidiaphragm is noted. Left lung base is not completely included in field-of-view. Visualized lung parenchyma is unremarkable. Bony thorax is unremarkable. IMPRESSION: Left lung base is not completely included in field-of-view. No definite abnormality seen in the visualized lung parenchyma. Electronically Signed   By: Lynwood Landy Raddle M.D.   On: 04/16/2024 12:06   EP STUDY Result Date: 04/01/2024 See surgical note for result.  ECHOCARDIOGRAM COMPLETE Result Date: 03/30/2024    ECHOCARDIOGRAM REPORT   Patient Name:   Wayne Gordon Date of Exam: 03/30/2024 Medical Rec #:  [id number]      Height:  76.0 in Accession #:    [id number]     Weight:       242.9 lb Date of Birth:  [birth year]       BSA:          2.406 m Patient Age:    89 years       BP:           106/66 mmHg Patient Gender: M              HR:           80 bpm. Exam Location:  Inpatient Procedure: 2D Echo, Cardiac Doppler, Color Doppler and Intracardiac            Opacification Agent (Both Spectral and Color Flow Doppler were            utilized during procedure). Indications:    I50.21 Acute systolic (congestive) heart failure  History:        Patient has prior history of Echocardiogram examinations, most                 recent 10/15/2021. CHF, CAD, Prior CABG and Defibrillator,                 Arrythmias:Atrial Fibrillation; Risk Factors:Hypertension and                 Diabetes.  Sonographer:    Damien Senior RDCS Referring Phys: [phone] JACOB J STINSON IMPRESSIONS  1. No LV thrombus by Definity . Left ventricular ejection fraction, by estimation, is 20 to 25%. The left ventricle has severely decreased function. The left ventricle demonstrates global hypokinesis. The left ventricular internal cavity size was mildly dilated. There is mild concentric left ventricular hypertrophy. Left ventricular diastolic parameters are indeterminate. There is the interventricular septum is  flattened in systole and diastole, consistent with right ventricular pressure and volume overload.  2. Right ventricular systolic function is mildly reduced. The right ventricular size is moderately enlarged. There is normal pulmonary artery systolic pressure. The estimated right ventricular systolic pressure is 32.8 mmHg.  3. Left atrial size was mildly dilated.  4. S/p Sorin Memo 3D ring annuloplasty (size 30mm, Catalog #[id number], serial [id number]). The mitral valve has been repaired/replaced. Mild mitral valve regurgitation. No evidence of mitral stenosis. The mean mitral valve gradient is 3.0 mmHg with average heart rate of 85 bpm. Procedure Date: 10/17/11. Echo findings are consistent with normal structure and function of the mitral valve prosthesis.  5. The tricuspid valve is abnormal. Tricuspid valve regurgitation is moderate.  6. The aortic valve is tricuspid. There is moderate calcification of the aortic valve. There is moderate thickening of the aortic valve. Aortic valve regurgitation is trivial. Aortic valve sclerosis/calcification is present, without any evidence of aortic stenosis.  7. The inferior vena cava is normal in size with <50% respiratory variability, suggesting right atrial pressure of 8 mmHg. FINDINGS  Left Ventricle: No LV thrombus by Definity . Left ventricular ejection fraction, by estimation, is 20 to 25%. The left ventricle has severely decreased function. The left ventricle demonstrates global hypokinesis. Definity  contrast agent was given IV to delineate the left ventricular endocardial borders. Strain was performed and the global longitudinal strain is indeterminate. The left ventricular internal cavity size was mildly dilated. There is mild concentric left ventricular hypertrophy. The interventricular septum is flattened in systole and diastole, consistent with right ventricular pressure and volume overload. Left ventricular diastolic function could not be evaluated due to mitral valve  repair. Left ventricular diastolic  parameters are  indeterminate. Right Ventricle: The right ventricular size is moderately enlarged. No increase in right ventricular wall thickness. Right ventricular systolic function is mildly reduced. There is normal pulmonary artery systolic pressure. The tricuspid regurgitant velocity is 2.49 m/s, and with an assumed right atrial pressure of 8 mmHg, the estimated right ventricular systolic pressure is 32.8 mmHg. Left Atrium: Left atrial size was mildly dilated. Right Atrium: Right atrial size was normal in size. Pericardium: There is no evidence of pericardial effusion. Mitral Valve: S/p Sorin Memo 3D ring annuloplasty (size 30mm, Catalog #[id number], serial [id number]). The mitral valve has been repaired/replaced. Mild mitral valve regurgitation. There is a 30 mm Mitral Memo ring prosthetic annuloplasty ring present in the mitral position. Echo findings are consistent with normal structure and function of the mitral valve prosthesis. No evidence of mitral valve stenosis. MV peak gradient, 6.6 mmHg. The mean mitral valve gradient is 3.0 mmHg with average heart rate of 85 bpm. Tricuspid Valve: The tricuspid valve is abnormal. Tricuspid valve regurgitation is moderate. Aortic Valve: The aortic valve is tricuspid. There is moderate calcification of the aortic valve. There is moderate thickening of the aortic valve. Aortic valve regurgitation is trivial. Aortic valve sclerosis/calcification is present, without any evidence of aortic stenosis. Pulmonic Valve: The pulmonic valve was not well visualized. Pulmonic valve regurgitation is trivial. Aorta: The aortic root and ascending aorta are structurally normal, with no evidence of dilitation. Venous: The inferior vena cava is normal in size with less than 50% respiratory variability, suggesting right atrial pressure of 8 mmHg. IAS/Shunts: No atrial level shunt detected by color flow Doppler. Additional Comments: 3D was performed not requiring  image post processing on an independent workstation and was indeterminate. A device lead is visualized in the right atrium and right ventricle.  LEFT VENTRICLE PLAX 2D LVIDd:         5.10 cm LVIDs:         4.80 cm LV PW:         1.20 cm LV IVS:        1.20 cm LVOT diam:     2.00 cm LV SV:         33 LV SV Index:   14 LVOT Area:     3.14 cm  LEFT ATRIUM              Index        RIGHT ATRIUM           Index LA diam:        4.60 cm  1.91 cm/m   RA Area:     18.60 cm LA Vol (A2C):   107.0 ml 44.47 ml/m  RA Volume:   51.50 ml  21.40 ml/m LA Vol (A4C):   79.8 ml  33.17 ml/m LA Biplane Vol: 93.3 ml  38.78 ml/m  AORTIC VALVE LVOT Vmax:   54.30 cm/s LVOT Vmean:  37.400 cm/s LVOT VTI:    0.104 m  AORTA Ao Root diam: 3.30 cm Ao Asc diam:  3.70 cm MITRAL VALVE             TRICUSPID VALVE MV Area VTI:  1.34 cm   TR Peak grad:   24.8 mmHg MV Peak grad: 6.6 mmHg   TR Vmax:        249.00 cm/s MV Mean grad: 3.0 mmHg MV Vmax:      1.29 m/s   SHUNTS MV Vmean:     80.7 cm/s  Systemic VTI:  0.10 m                          Systemic Diam: 2.00 cm Vishnu Priya Mallipeddi Electronically signed by Diannah Late Mallipeddi Signature Date/Time: 03/30/2024/11:31:43 AM    Final    DG Chest Portable 1 View Result Date: 03/29/2024 CLINICAL DATA:  Weakness.  Dizziness. EXAM: PORTABLE CHEST 1 VIEW COMPARISON:  10/15/2021. FINDINGS: Low lung volume. Mild diffuse pulmonary vascular congestion, likely accentuated by low lung volume. No frank pulmonary edema. There are probable atelectatic changes at the lung bases. There is subtle blunting of bilateral lateral costophrenic angles, which may be due to trace pleural effusions versus pleural thickening. No pneumothorax on either side. Stable cardio-mediastinal silhouette. There are surgical staples along the heart border and sternotomy wires, status post CABG (coronary artery bypass graft). There is a left sided 3-lead pacemaker. Neurostimulator device noted overlying the lower thoracic spine  region. No acute osseous abnormalities. The soft tissues are within normal limits. IMPRESSION: Mild diffuse pulmonary vascular congestion, likely accentuated by low lung volume. No frank pulmonary edema. Electronically Signed   By: Ree Molt M.D.   On: 03/29/2024 12:09    Alm Schneider, DO  [facility]  If 7PM-7AM, please contact night-coverage [email or web address] Password [email or web address] 04/21/2024, 1:33 PM   LOS: 5 days

## 2024-04-21 NOTE — Plan of Care (Signed)
   Problem: Education: Goal: Ability to describe self-care measures that may prevent or decrease complications (Diabetes Survival Skills Education) will improve Outcome: Progressing Goal: Individualized Educational Video(s) Outcome: Progressing   Problem: Coping: Goal: Ability to adjust to condition or change in health will improve Outcome: Progressing

## 2024-04-21 NOTE — Progress Notes (Deleted)
 Cardiology Office Note:  .   Date:  04/21/2024  ID:  Wayne Gordon, DOB 02-06-1934, MRN [id number] PCP: Wayne Dorothey HERO, NP  [facility] Providers Cardiologist:  Diannah SHAUNNA Maywood, MD {  History of Present Illness: .   Wayne Gordon is a 89 y.o. male w/PMHx of  HTN, HLD, DM, chronic back pain (spinal cord stimulator), CKD (III)  CAD, VHD (CABG MV repair 2013),  ICM, chronic CHF (systolic), ICD, VT,  AFib  He saw Dr. Waddell 03/06/24, improved symptoms post CRT, no changes were made  Admitted 03/29/24 with malaise, edema, acute/chronic CHF Diuresed > started on amiodarone , DCCV to SR Discharged 04/01/24  called with reports of symptoms felt 2/2 amiodarone  (myalgias)and was advised to stop the amiodarone   Saw cards team 04/11/24, had taken some extra lasix  for edema, meds continued > advised low Na diet  Admitted ([facility]) 04/16/24, again with progressive edema, weight gain, IV lasix  started and up titrated  *** Discharged ***  Today's visit is scheduled as to evaluate LV lead (auto LV threshold unable to run) ROS:   *** AF burden *** eliquis , dose, bleeding, labs *** rate   Device information Biotronik single lead ICD (VDD), implanted 03/28/2012>> upgrade to CRT 10/12/2020  + h/o appropriate therapy, 2016, VT  Arrhythmia/AAD hx Amiodarone  Aug 2025 only briefly > stopped quickly reports of myalgias, ? dizziness  Studies Reviewed: SABRA    EKG not done today  DEVICE interrogation done today and reviewed by myself *** Battery and lead measurements are good ***  03/30/24: TTE 1. No LV thrombus by Definity . Left ventricular ejection fraction, by  estimation, is 20 to 25%. The left ventricle has severely decreased  function. The left ventricle demonstrates global hypokinesis. The left  ventricular internal cavity size was mildly  dilated. There is mild concentric left ventricular hypertrophy. Left  ventricular diastolic parameters are indeterminate. There is the   interventricular septum is flattened in systole and diastole, consistent  with right ventricular pressure and volume  overload.   2. Right ventricular systolic function is mildly reduced. The right  ventricular size is moderately enlarged. There is normal pulmonary artery  systolic pressure. The estimated right ventricular systolic pressure is  32.8 mmHg.   3. Left atrial size was mildly dilated.   4. S/p Sorin Memo 3D ring annuloplasty (size 30mm, Catalog #[id number], serial  [id number]). The mitral valve has been repaired/replaced. Mild mitral valve  regurgitation. No evidence of mitral stenosis. The mean mitral valve  gradient is 3.0 mmHg with average  heart rate of 85 bpm. Procedure Date: 10/17/11. Echo findings are  consistent with normal structure and function of the mitral valve  prosthesis.   5. The tricuspid valve is abnormal. Tricuspid valve regurgitation is  moderate.   6. The aortic valve is tricuspid. There is moderate calcification of the  aortic valve. There is moderate thickening of the aortic valve. Aortic  valve regurgitation is trivial. Aortic valve sclerosis/calcification is  present, without any evidence of  aortic stenosis.   7. The inferior vena cava is normal in size with <50% respiratory  variability, suggesting right atrial pressure of 8 mmHg.     Risk Assessment/Calculations:    Physical Exam:   VS:  There were no vitals taken for this visit.   Wt Readings from Last 3 Encounters:  04/21/24 251 lb 8.7 oz (114.1 kg)  04/11/24 248 lb 12.8 oz (112.9 kg)  03/31/24 234 lb 4.8 oz (106.3 kg)    GEN:  Well nourished, well developed in no acute distress NECK: No JVD; No carotid bruits CARDIAC: ***RRR, no murmurs, rubs, gallops RESPIRATORY:  *** CTA b/l without rales, wheezing or rhonchi  ABDOMEN: Soft, non-tender, non-distended EXTREMITIES: *** No edema; No deformity   ICD site: *** is stable, no thinning, fluctuation, tethering  ASSESSMENT AND PLAN: .     persistent AFib CHA2DS2C+Vasc is 6, on Eliquis , *** appropriately doesed *** % burden  VT ***  ICD *** intact function *** no programming changes made  ICM Chronic CHF (systolic) *** % BP *** Recent recurrent hospitalizations with HF exacerbations c/w Dr. Mallipedi/team  CAD CABG VHD (MV repair) *** C/w Dr. Mallipeddi/team  Secondary hypercoagulable state 2/2 AFib     {Are you ordering a CV Procedure (e.g. stress test, cath, DCCV, TEE, etc)?   Press F2        :[id number]}     Dispo: ***  Signed, Charlies Macario Arthur, PA-C

## 2024-04-22 ENCOUNTER — Inpatient Hospital Stay (HOSPITAL_COMMUNITY)

## 2024-04-22 DIAGNOSIS — Z515 Encounter for palliative care: Secondary | ICD-10-CM

## 2024-04-22 DIAGNOSIS — I5023 Acute on chronic systolic (congestive) heart failure: Secondary | ICD-10-CM | POA: Diagnosis not present

## 2024-04-22 DIAGNOSIS — N184 Chronic kidney disease, stage 4 (severe): Secondary | ICD-10-CM | POA: Diagnosis not present

## 2024-04-22 DIAGNOSIS — I4819 Other persistent atrial fibrillation: Secondary | ICD-10-CM | POA: Diagnosis not present

## 2024-04-22 DIAGNOSIS — G9341 Metabolic encephalopathy: Secondary | ICD-10-CM

## 2024-04-22 DIAGNOSIS — I10 Essential (primary) hypertension: Secondary | ICD-10-CM | POA: Diagnosis not present

## 2024-04-22 DIAGNOSIS — J9601 Acute respiratory failure with hypoxia: Secondary | ICD-10-CM | POA: Diagnosis not present

## 2024-04-22 DIAGNOSIS — N179 Acute kidney failure, unspecified: Secondary | ICD-10-CM

## 2024-04-22 DIAGNOSIS — Z789 Other specified health status: Secondary | ICD-10-CM | POA: Diagnosis not present

## 2024-04-22 DIAGNOSIS — Z66 Do not resuscitate: Secondary | ICD-10-CM

## 2024-04-22 DIAGNOSIS — I5021 Acute systolic (congestive) heart failure: Secondary | ICD-10-CM | POA: Diagnosis not present

## 2024-04-22 DIAGNOSIS — Z7189 Other specified counseling: Secondary | ICD-10-CM | POA: Diagnosis not present

## 2024-04-22 LAB — GLUCOSE, CAPILLARY
Glucose-Capillary: 171 mg/dL — ABNORMAL HIGH (ref 70–99)
Glucose-Capillary: 168 mg/dL — ABNORMAL HIGH (ref 70–99)
Glucose-Capillary: 149 mg/dL — ABNORMAL HIGH (ref 70–99)
Glucose-Capillary: 159 mg/dL — ABNORMAL HIGH (ref 70–99)

## 2024-04-22 LAB — HEPATIC FUNCTION PANEL
ALT: 44 U/L (ref 0–44)
AST: 55 U/L — ABNORMAL HIGH (ref 15–41)
Albumin: 3 g/dL — ABNORMAL LOW (ref 3.5–5.0)
Alkaline Phosphatase: 94 U/L (ref 38–126)
Bilirubin, Direct: 1 mg/dL — ABNORMAL HIGH (ref 0.0–0.2)
Indirect Bilirubin: 1.2 mg/dL — ABNORMAL HIGH (ref 0.3–0.9)
Total Bilirubin: 2.2 mg/dL — ABNORMAL HIGH (ref 0.0–1.2)
Total Protein: 6.2 g/dL — ABNORMAL LOW (ref 6.5–8.1)

## 2024-04-22 LAB — CBC
HCT: 41.8 % (ref 39.0–52.0)
Hemoglobin: 13.2 g/dL (ref 13.0–17.0)
MCH: 29.4 pg (ref 26.0–34.0)
MCHC: 31.6 g/dL (ref 30.0–36.0)
MCV: 93.1 fL (ref 80.0–100.0)
Platelets: 169 K/uL (ref 150–400)
RBC: 4.49 MIL/uL (ref 4.22–5.81)
RDW: 15.4 % (ref 11.5–15.5)
WBC: 13.3 K/uL — ABNORMAL HIGH (ref 4.0–10.5)
nRBC: 0 % (ref 0.0–0.2)

## 2024-04-22 LAB — BLOOD GAS, ARTERIAL
Acid-Base Excess: 0.9 mmol/L (ref 0.0–2.0)
Bicarbonate: 27.5 mmol/L (ref 20.0–28.0)
Drawn by: 41977
O2 Saturation: 95.9 %
Patient temperature: 37.1
pCO2 arterial: 51 mmHg — ABNORMAL HIGH (ref 32–48)
pH, Arterial: 7.34 — ABNORMAL LOW (ref 7.35–7.45)
pO2, Arterial: 69 mmHg — ABNORMAL LOW (ref 83–108)

## 2024-04-22 LAB — BASIC METABOLIC PANEL WITH GFR
Anion gap: 9 (ref 5–15)
BUN: 58 mg/dL — ABNORMAL HIGH (ref 8–23)
CO2: 25 mmol/L (ref 22–32)
Calcium: 8.3 mg/dL — ABNORMAL LOW (ref 8.9–10.3)
Chloride: 101 mmol/L (ref 98–111)
Creatinine, Ser: 2.96 mg/dL — ABNORMAL HIGH (ref 0.61–1.24)
GFR, Estimated: 20 mL/min — ABNORMAL LOW (ref >60)
Glucose, Bld: 155 mg/dL — ABNORMAL HIGH (ref 70–99)
Potassium: 5.4 mmol/L — ABNORMAL HIGH (ref 3.5–5.1)
Sodium: 135 mmol/L (ref 135–145)

## 2024-04-22 LAB — URINALYSIS, W/ REFLEX TO CULTURE (INFECTION SUSPECTED)
Bilirubin Urine: NEGATIVE
Glucose, UA: >=500 mg/dL — AB
Hgb urine dipstick: NEGATIVE
Ketones, ur: NEGATIVE mg/dL
Leukocytes,Ua: NEGATIVE
Nitrite: NEGATIVE
Protein, ur: NEGATIVE mg/dL
Specific Gravity, Urine: 1.01 (ref 1.005–1.030)
pH: 5 (ref 5.0–8.0)

## 2024-04-22 LAB — AMMONIA: Ammonia: 33 umol/L (ref 9–35)

## 2024-04-22 LAB — POTASSIUM: Potassium: 4.8 mmol/L (ref 3.5–5.1)

## 2024-04-22 LAB — MRSA NEXT GEN BY PCR, NASAL: MRSA by PCR Next Gen: NOT DETECTED

## 2024-04-22 MED ORDER — DOXYCYCLINE HYCLATE 100 MG PO TABS
100.0000 mg | ORAL_TABLET | Freq: Two times a day (BID) | ORAL | Status: DC
  Administered 2024-04-22 – 2024-04-25 (×6): 100 mg via ORAL
  Filled 2024-04-22 (×6): qty 1

## 2024-04-22 MED ORDER — SODIUM CHLORIDE 0.9 % IV SOLN
2.0000 g | INTRAVENOUS | Status: DC
  Administered 2024-04-22 – 2024-04-24 (×3): 2 g via INTRAVENOUS
  Filled 2024-04-22 (×3): qty 12.5

## 2024-04-22 MED ORDER — SODIUM ZIRCONIUM CYCLOSILICATE 10 G PO PACK
10.0000 g | PACK | Freq: Once | ORAL | Status: AC
  Administered 2024-04-22: 10 g via ORAL
  Filled 2024-04-22: qty 1

## 2024-04-22 NOTE — Progress Notes (Signed)
 Physical Therapy Treatment Patient Details Name: Wayne Gordon MRN: [id number] DOB: [birth year] Today's Date: 04/22/2024   History of Present Illness Wayne Gordon is a 89 year old male with HFrEF, ischemic cardiomyopathy, LVEF 20-25%, status post ICD, pacer placement, stage IV CKD, atrial fibrillation/atrial flutter on chronic anticoagulation with apixaban , vertigo, osteoarthritis, type 2 diabetes mellitus on insulin , with renal complications, hyperlipidemia, hypertension, coronary artery disease, status post mitral valve repair, recent hospitalizations, recently discharged 04/01/2024 with a discharge weight of 234 pounds at that time.  EP had performed a cardioversion on 8/4 for symptomatic atrial flutter.  Since hospital discharge, his weight has steadily climbed despite taking oral diuretics at home.  He is now up to 247 pounds.  He is increasingly becoming weaker.  He saw cardiology in the outpatient setting recently and was taken off amiodarone .  He was brought into the emergency department because he continued to have swelling and weight gain in his legs.  He complained of severe fatigue and weakness and was unable to get out of bed this morning because he was so weak.  He was advised by the cardiology office to call EMS and come to the ED.  He was noted to be severely deconditioned and frail with significant peripheral edema, elevated BNP of 681, troponin 25, potassium 3.4, creatinine 2.75 and request was made for inpatient hospitalization for IV diuresis as he is failing outpatient diuresis with oral medication likely due to advanced renal failure.    PT Comments  Patient demonstrates slow labored movement for sitting up at bedside with difficulty moving legs due to weakness, once seated had frequent right lateral leaning requiring Min assist to keep trunk in midline, very unsteady on feet and limited to a few side steps before having to sit due to fall risk. Patient tolerated sitting up in chair  after therapy - nursing staff notified. Patient will benefit from continued skilled physical therapy in hospital and recommended venue below to increase strength, balance, endurance for safe ADLs and gait.       If plan is discharge home, recommend the following: A lot of help with walking and/or transfers;A lot of help with bathing/dressing/bathroom;Help with stairs or ramp for entrance;Assistance with cooking/housework;Assist for transportation   Can travel by private vehicle     No  Equipment Recommendations  None recommended by PT    Recommendations for Other Services       Precautions / Restrictions Precautions Precautions: Fall Recall of Precautions/Restrictions: Intact Restrictions Weight Bearing Restrictions Per Provider Order: No     Mobility  Bed Mobility Overal bed mobility: Needs Assistance Bed Mobility: Supine to Sit     Supine to sit: HOB elevated, Mod assist     General bed mobility comments: slow labored movement with difficulty moving legs due to weakness, HOB partially raised    Transfers Overall transfer level: Needs assistance Equipment used: Rolling walker (2 wheels) Transfers: Sit to/from Stand, Bed to chair/wheelchair/BSC Sit to Stand: Mod assist   Step pivot transfers: Mod assist       General transfer comment: increased time, labored movement    Ambulation/Gait Ambulation/Gait assistance: Mod assist, Max assist Gait Distance (Feet): 5 Feet Assistive device: Rolling walker (2 wheels) Gait Pattern/deviations: Decreased step length - right, Decreased step length - left, Decreased stride length, Narrow base of support, Knees buckling Gait velocity: slow     General Gait Details: limited to a few slow labored unsteady side steps before having to sit due to BLE weakness  Stairs             Wheelchair Mobility     Tilt Bed    Modified Rankin (Stroke Patients Only)       Balance Overall balance assessment: Needs  assistance Sitting-balance support: Feet supported, No upper extremity supported Sitting balance-Leahy Scale: Poor Sitting balance - Comments: fair/poor seated at EOB Postural control: Right lateral lean Standing balance support: During functional activity, Reliant on assistive device for balance, Bilateral upper extremity supported Standing balance-Leahy Scale: Poor Standing balance comment: using RW                            Communication Communication Communication: No apparent difficulties  Cognition Arousal: Alert Behavior During Therapy: WFL for tasks assessed/performed   PT - Cognitive impairments: No apparent impairments                         Following commands: Intact      Cueing Cueing Techniques: Verbal cues, Visual cues, Tactile cues  Exercises General Exercises - Lower Extremity Long Arc Quad: Seated, AROM, Strengthening, Both, 10 reps Hip Flexion/Marching: Seated, AROM, Strengthening, Both, 10 reps Toe Raises: Seated, AROM, Strengthening, Both, 10 reps Heel Raises: Seated, AROM, Strengthening, Both, 10 reps    General Comments        Pertinent Vitals/Pain Pain Assessment Pain Assessment: No/denies pain    Home Living                          Prior Function            PT Goals (current goals can now be found in the care plan section) Acute Rehab PT Goals Patient Stated Goal: Return home PT Goal Formulation: With patient Time For Goal Achievement: 05/01/24 Potential to Achieve Goals: Good Progress towards PT goals: Progressing toward goals    Frequency    Min 3X/week      PT Plan      Co-evaluation              AM-PAC PT 6 Clicks Mobility   Outcome Measure  Help needed turning from your back to your side while in a flat bed without using bedrails?: A Lot Help needed moving from lying on your back to sitting on the side of a flat bed without using bedrails?: A Lot Help needed moving to and from  a bed to a chair (including a wheelchair)?: A Lot Help needed standing up from a chair using your arms (e.g., wheelchair or bedside chair)?: A Lot Help needed to walk in hospital room?: A Lot Help needed climbing 3-5 steps with a railing? : Total 6 Click Score: 11    End of Session Equipment Utilized During Treatment: Oxygen  Activity Tolerance: Patient tolerated treatment well;Patient limited by fatigue Patient left: in chair;with call bell/phone within reach;with chair alarm set Nurse Communication: Mobility status PT Visit Diagnosis: Unsteadiness on feet (R26.81);Other abnormalities of gait and mobility (R26.89);Muscle weakness (generalized) (M62.81);Difficulty in walking, not elsewhere classified (R26.2)     Time: 8955-8887 PT Time Calculation (min) (ACUTE ONLY): 28 min  Charges:    $Therapeutic Exercise: 8-22 mins $Therapeutic Activity: 8-22 mins PT General Charges $$ ACUTE PT VISIT: 1 Visit                     12:35 PM, 04/22/24 Lynwood Music, MPT Physical Therapist with [facility]  [facility] [address] [phone] office [phone] mobile phone

## 2024-04-22 NOTE — Plan of Care (Signed)
  Problem: Coping: Goal: Ability to adjust to condition or change in health will improve Outcome: Progressing   Problem: Fluid Volume: Goal: Ability to maintain a balanced intake and output will improve Outcome: Progressing   Problem: Metabolic: Goal: Ability to maintain appropriate glucose levels will improve Outcome: Progressing   Problem: Nutritional: Goal: Maintenance of adequate nutrition will improve Outcome: Progressing Goal: Progress toward achieving an optimal weight will improve Outcome: Progressing   Problem: Skin Integrity: Goal: Risk for impaired skin integrity will decrease Outcome: Progressing   Problem: Tissue Perfusion: Goal: Adequacy of tissue perfusion will improve Outcome: Progressing   Problem: Education: Goal: Knowledge of General Education information will improve Description: Including pain rating scale, medication(s)/side effects and non-pharmacologic comfort measures Outcome: Progressing   Problem: Clinical Measurements: Goal: Ability to maintain clinical measurements within normal limits will improve Outcome: Progressing Goal: Will remain free from infection Outcome: Progressing Goal: Diagnostic test results will improve Outcome: Progressing Goal: Respiratory complications will improve Outcome: Progressing Goal: Cardiovascular complication will be avoided Outcome: Progressing   Problem: Activity: Goal: Risk for activity intolerance will decrease Outcome: Progressing   Problem: Nutrition: Goal: Adequate nutrition will be maintained Outcome: Progressing   Problem: Coping: Goal: Level of anxiety will decrease Outcome: Progressing   Problem: Elimination: Goal: Will not experience complications related to bowel motility Outcome: Progressing Goal: Will not experience complications related to urinary retention Outcome: Progressing   Problem: Safety: Goal: Ability to remain free from injury will improve Outcome: Progressing    Problem: Skin Integrity: Goal: Risk for impaired skin integrity will decrease Outcome: Progressing

## 2024-04-22 NOTE — Progress Notes (Signed)
 CT chest results noted and reviewed  --Areas of right lower lobe consolidation and volume loss --Mild patchy interstitial prominence in the superior segment right lower lobe, posterior right upper lobe and in the left lower lobe>>pneumonia or mild pulmonary edema. --Patchy density in the lingula suspicious for pneumonia   Start cefepime  and doxy  DTat

## 2024-04-22 NOTE — Consult Note (Signed)
 Consultation Note Date: 04/22/2024   Patient Name: Wayne Gordon  DOB: 12-Jul-1934  MRN: [id number]  Age / Sex: 89 y.o., male  PCP: Renato Dorothey HERO, NP Referring Physician: Evonnie Lenis, MD  Reason for Consultation: Establishing goals of care  HPI/Patient Profile: 89 y.o. male  with past medical history of HFrEF, CAD status post CABG and mitral valve repair 2013, LVEF 20-25%, status post ICD, pacer placement, stage IV CKD, persistent atrial fibrillation/atrial flutter on chronic anticoagulation with apixaban  status post DCCV 84/25, type 2 diabetes mellitus on insulin , with renal complications, hyperlipidemia, hypertension, coronary artery disease, status post mitral valve repair  admitted on 04/16/2024 with weight gain, increased weakness, and shortness of breath despite being compliant with home oral diuretics.   Patient was admitted for acute on chronic HFrEF.  Treated with IV diuresis.  Cardiology is following.  Hospital course was complicated by varying levels of alertness and now with new oxygen  requirement. Undergoing further workup. Cardiology has been following him as an outpatient and making adjustments to his medications to address symptoms as well. He is also followed by endocrinology for his T2DM. Underwent cardioversion 04/01/2024 and converted to NSR.    PMT has been consulted to assist with goals of care conversation.  Today, labs independently reviewed.  CO2 level is elevated at 51 and O2 level decreased at 69.  Renal function increased compared to prior in the setting of need for aggressive diuresis and cardiorenal syndrome.  Albumin  low at 3.  AST and bilirubin levels elevated.  Slight increase in white blood cell count today (13.3) compared to prior (11.8).  Hemoglobin stable.  EKG completed 04/22/2024: Tracing consistent with biventricular pacemaker and AV dual paced rhythm.  Chest x-ray from 04/19/2024 independently reviewed Inferior aspect of left lung  outside of the radiographic field.  Pacemaker and associated leads partially visible.  Appears to have some airspace opacities consistent with volume overload.  Intake and output reviewed.  Had significant urine output over the past few days.  No output documented today but some dark yellow urine present in suction canister.  Vital signs reveal blood pressure in the 100-110s systolic.  Heart rate rate controlled.  Requiring 2.5 L of supplemental O2 to maintain O2 sats.  Medication ministration record review.  I see where IV Lasix  was discontinued.  No as needed symptom medicines have been required over the past 24 hours.  Today, patient is seen with his wife Orrin Mcneil) and daughter Giles Pereyra) at bedside.  He is very somnolent.  He is able to answer some questions but largely sleeps.  He does deny any pain or shortness of breath at rest.  Just states that he is sleepy.  He does indicate some shortness of breath with exertion.  Independent history obtained from patient's family as he is very fatigued and only able to provide limited responses to questions.  See below.  Most of the history documented below is obtained from wife and daughter.  Clinical Assessment and Goals of Care:  I have reviewed medical records including EPIC notes, labs and imaging (independently reviewed), prior outpatient specialist notes, prior hospital admission notes, medication administration record, vital signs, intake and output, assessed the patient and then met with patient, daughter, and wife to discuss diagnosis prognosis, GOC, EOL wishes, disposition and options. Collaborated directly with attending physician, TOC, and bedside nursing staff.   I introduced Palliative Medicine as specialized medical care for people living with serious illness. It focuses on providing relief from the symptoms  and stress of a serious illness. The goal is to improve quality of life for both the patient and the family.  We discussed a  brief life review of the patient and then focused on their current illness.   I attempted to elicit values and goals of care important to the patient.     The patient and family consented to a voluntary Advance Care Planning Conversation in person. Individuals present for the conversation: patient, Gar Glance, Olam Pereyra  Medical History Review and Family/Patient Understanding:   Patient's wife and daughter have a good understanding of patient's current health concerns. Engaged in a detailed conversation with the patient and family about the seriousness of his current illness,  in light of his advanced age and complex medical history, and explained the potential implications this may have for his recovery and long-term well-being.  Family verbalized understanding.  We also discussed the difficulty of managing congestive heart failure in the setting of someone with significant renal impairment.  Social History:  Patient has been married to his wife for 60 years.  They have 3 children 2 daughters and 1 son.  They have numerous grandchildren and great-grandchildren.  All of the family lives locally.  They have a son who lives with them and has been helping out in the home and all of the children and grandchildren are very loving and supportive.  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  He was an athlete and enjoyed sports including golfing, softball, and basketball.  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   Functional and Nutritional State:  Up until a few months ago, patient was ambulatory with a walker and still able to drive.  Unfortunately, as of a month ago he started to have more trouble getting around even with his walker.  In the days prior to this admission, he had significantly increased weakness and difficulty managing ambulation and self-care task.  He also had a decreased appetite over the  past few weeks with some early satiety.    Palliative Symptoms:  Shortness of breath with exertion  Advance Directives/goals of care/anticipatory care planning:  A detailed discussion regarding advanced directives was had.  Spoke with patient, wife, and daughter at length about our concerns about patient's overall health status in the setting of this acute on chronic exacerbation of his heart failure especially given limitations due to his chronic kidney disease as well as age and comorbidities that also play a role.  Discussed that while we hope patient will improve, we are worried about him and that he has continued to decline despite aggressive measures here in the hospital.  He is also had a decline in kidney function which is limiting our ability to aggressively diurese and has now developed some somnolence.  They have never completed a living will or advanced directives.  Patient's wife states that if unable to speak for himself, she would be open to making medical decisions for him with the support and guidance from her children.  There is no formal healthcare power of attorney established.  Discussed goals of care.  They indicate that quality of life is important for patient.  He was a hard-working and independent person.  They are hopeful for patient to improve enough to be discharged to the [facility] for skilled nursing with their ultimate goal to be for him to return home with family. Engaged in discussion of advance directives  including the limitations and potential burdens of CPR and intubation, particularly in the context of advanced age and serious underlying health conditions.  Patient's wife states that he has already told her before that he would not want to be placed on life support.  Patient is arousable enough to confirm that he would not want a dependent life or to be kept alive by machines.  Given patient's age, comorbidities, declining health, as well as his goals of care,  recommended DNR/DNI.  Patient, wife, and daughter are agreeable with DNR/DNI.  At this time, they are hopeful he will improve so they wish to continue to treat the treatable and pursue further workup.  We did discuss that if patient continues to decline then it would be appropriate to consider comfort care.  Discussed comfort care and what this will mean.  Also discussed that they will likely need some additional support once patient discharged from the hospital.  Provided education on the differences between hospice and palliative care.  Provided education on both hospice and palliative care philosophy, eligibility criteria, and potential benefits.  Will allow time for outcomes and further workup and continue to discuss goals of care and anticipatory care plan.  Discussed the importance of continued conversation with family and the medical providers regarding overall plan of care and treatment options, ensuring decisions are within the context of the patient's values and GOCs.   Questions and concerns were addressed. The family was encouraged to call with questions or concerns.  PMT will continue to support holistically.  I spent 30 minutes providing separately identifiable ACP services with the patient and/or surrogate decision maker in a voluntary, in-person conversation discussing the patient's wishes and goals as detailed in the above note.   Code Status:  DNR/DNI/treat the treatable, goldenrod form completed, MOST form deferred until more information regarding workup for somnolence is received   PATIENT, wife if patient unable to speak for himself     SUMMARY OF RECOMMENDATIONS    CODE STATUS updated to DNR/DNI Continue to treat the treatable and pursue further workup of somnolence Symptoms appear well-controlled, continue current symptom regimen Palliative medicine team will continue to follow for ongoing goals of care discussion, disposition support, and symptom management Consider  hospice versus outpatient palliative care referral at discharge, contingent upon patient's response to treatment and diagnostic findings.  Code Status/Advance Care Planning:  DNR   Symptom Management:   Symptoms stable at present, therefore continue symptom regimen per admitting team with PMT available as needed for support   Psycho-social/Spiritual:  Desire for further Chaplaincy support:yes  Prognosis:  Unable to determine  Discharge Planning: To Be Determined      Primary Diagnoses: Present on Admission:  Acute HFrEF (heart failure with reduced ejection fraction) (HCC)  Ventricular arrhythmia  Chronic kidney disease (CKD), stage IV (severe) (HCC)  Ischemic cardiomyopathy  Hyperlipidemia  HTN (hypertension)  History of implantable cardioverter-defibrillator (ICD) placement  Gonadotropin deficiency (HCC)  GERD (gastroesophageal reflux disease)  Automatic implantable cardioverter-defibrillator in situ  Atrial fibrillation (HCC)  Atrial flutter (HCC)  Abnormal weight gain  Hypokalemia  Acute respiratory failure with hypoxia (HCC)    Physical Exam Constitutional:      General: He is not in acute distress.    Appearance: He is not toxic-appearing.  Pulmonary:     Effort: No respiratory distress.  Skin:    General: Skin is warm and dry.  Neurological:     Mental Status: He is lethargic.     Comments: Arousable  to physical stimuli but rapidly falls back asleep      Vital Signs: BP 113/76   Pulse 72   Temp 98.5 F (36.9 C) (Oral)   Resp 20   Ht 6' 6 (1.981 m)   Wt 114.1 kg   SpO2 93%   BMI 29.07 kg/m  Pain Scale: 0-10 POSS *See Group Information*: 1-Acceptable,Awake and alert Pain Score: 0-No pain   SpO2: SpO2: 93 % O2 Device:SpO2: 93 % O2 Flow Rate: .O2 Flow Rate (L/min): 2.5 L/min   Palliative Assessment/Data: 50%    Billing based on MDM: High  Problems Addressed: One acute or chronic illness or injury that poses a threat to life or  bodily function  Amount and/or Complexity of Data: Category 1:Assessment requiring an independent historian(s), Category 2:Independent interpretation of a test performed by another physician/other qualified health care professional (not separately reported), and Category 3:Discussion of management or test interpretation with external physician/other qualified health care professional/appropriate source (not separately reported)  Risks: Decision not to resuscitate or to de-escalate care because of poor prognosis   Laymon CHRISTELLA Pinal, NP  Palliative Medicine Team Team phone # [phone]  Thank you for allowing the Palliative Medicine Team to assist in the care of this patient. Please utilize secure chat with additional questions, if there is no response within 30 minutes please call the above phone number.  Palliative Medicine Team providers are available by phone from 7am to 7pm daily and can be reached through the team cell phone.  Should this patient require assistance outside of these hours, please call the patient's attending physician.

## 2024-04-22 NOTE — Progress Notes (Addendum)
 PROGRESS NOTE  Wayne Gordon FMW:[id number] DOB: 07-Dec-1933 DOA: 04/16/2024 PCP: Renato Dorothey HERO, NP  Brief History:  89 year old male with HFrEF, CAD status post CABG and mitral valve repair 2013, LVEF 20-25%, status post ICD, pacer placement, stage IV CKD, persistent atrial fibrillation/atrial flutter on chronic anticoagulation with apixaban  status post DCCV 84/25, ype 2 diabetes mellitus on insulin , with renal complications, hyperlipidemia, hypertension, coronary artery disease, status post mitral valve repair, recent hospitalizations, recently discharged 04/01/2024 with a discharge weight of 234 pounds at that time.  EP had performed a cardioversion on 8/4 for symptomatic atrial flutter.  The patient was admitted to the hospital from 03/29/2024 to 04/01/2024 for acute on chronic HFrEF.   He was discharged home on furosemide  40 mg p.o. daily.  Since hospital discharge, his weight has steadily climbed despite taking oral diuretics at home.  He is now up to 247 pounds.  He is increasingly becoming weaker.  He saw cardiology in the outpatient setting recently and was taken off amiodarone  04/05/24 due to patient report of arm and leg weakness.  He was brought into the emergency department because he continued to have swelling and weight gain in his legs and worsening shortness of breath.  He complained of severe fatigue and weakness and was unable to get out of bed this morning because he was so weak.  He was advised by the cardiology office to call EMS and come to the ED.  He was noted to be severely deconditioned and frail with significant peripheral edema, elevated BNP of 681, troponin 25, potassium 3.4, creatinine 2.75 and request was made for inpatient hospitalization for IV diuresis as he is failing outpatient diuresis with oral medication in part due to advanced renal failure.  The patient is show some initial clinical improvement.  Unfortunately, he continued to remain severely deconditioned.   Although his I's and O's were incomplete, he did show ample diuresis.  Unfortunately, he developed acute on chronic renal failure in part due to cardiorenal syndrome.  He developed new oxygen  requirement on 2 L.  He was somewhat somnolent throughout the hospitalization but aroused and follow commands.  On 04/22/2024, the patient was more somnolent.  Further workup was initiated.  His diuretics were placed on hold secondary to uptrend of his serum creatinine.  Palliative medicine was consulted to initiate goals of care discussions.   Assessment/Plan:  Acute on chronic HFrEF - 03/30/2024 echo EF 20-25%, global HK, mild decreased RVF, s/p MV annuloplasty - Continue IV furosemide --increased to 80 mg IV  TID - 8/24--transitioned to po torsemide  - Cardiology consult appreciated - Daily weights--?accuracy - Accurate I's and O's--NEG 5L - 04/01/2024 discharge weight 234 lbs - Admission weight 247 lbs   Persistent atrial fibrillation - Status post DCCV 04/01/2024 - Continue carvedilol  - Continue apixaban  - Previously on amiodarone , but discontinued secondary to dizziness and generalized weakness - remains rate controlled   Acute on chronic CKD stage IV - Baseline creatinine 1.9-2.1 - Presented with serum creatinine 2.75 - serum creatinine trending up>>hold diuretics - Secondary to cardiorenal syndrome   Acute respiratory failure with hypoxia -he is now on 2L -CXR--retrocardiac airspace opacities -PCT 0.49 - CT chest   Acute Metabolic Encephalopathy -04/22/24--more somnolent -repeat UA -ABG -CT brain -ammonia - B12--1326 - Folic acid--19.7 - TSH--4.368 - CK 509>>690>>519  Coronary artery disease /elevated troponin - Status post CABG -Secondary to demand ischemia - No angina - holding  Crestor  temporarily due  to elevated CK - Currently on apixaban    Diabetes mellitus type 2, uncontrolled with hyperglycemia - 03/29/2024 hemoglobin A1c 7.7 - Continue NovoLog  sliding scale - Continue  Semglee  reduced dose - He is on Toujeo  26 units daily at home   Generalized weakness - Secondary to decompensated heart failure and electrolyte derangements and AKI - B12--1326 - Folic acid--19.7 - TSH--4.368 - PT evaluation>>SNF - UA no pyuria - CK 509>>690>>519  Hypokalemia>>hyperkalemia - Repleted - Check magnesium --2.4 - 04/22/24--give lokelma  x 1   Mixed hyperlipidemia - holding statin temporarily due to elevated CK   Sinus node dysfunction s/p ICD 2013 with upgrade of Biotronik BiV ICD 09/2020 - Device interrogation 02/2024: AP/VP rhythm with no treated arrhythmias - Continue to follow with EP      Family Communication:   daughter at bedside 8/25   Consultants:  cardiology   Code Status:  FULL    DVT Prophylaxis:  apixaban      Procedures: As Listed in Progress Note Above   Antibiotics: None        Subjective: Patient is somnolent but awakens and answers questions.  He denies any chest pain, shortness breath, abdominal pain.  He denies any nausea or vomiting or diarrhea.  Objective: Vitals:   04/22/24 0547 04/22/24 0709 04/22/24 0900 04/22/24 0936  BP: (!) 135/105  113/76 113/76  Pulse: 85  72 72  Resp: 16  20   Temp: 97.9 F (36.6 C)  98.5 F (36.9 C)   TempSrc: Oral  Oral   SpO2: 90% 94% 93%   Weight:      Height:        Intake/Output Summary (Last 24 hours) at 04/22/2024 0952 Last data filed at 04/22/2024 9062 Gross per 24 hour  Intake 486 ml  Output 400 ml  Net 86 ml   Weight change:  Exam:  General:  Pt is somnolent, follows commands appropriately, not in acute distress HEENT: No icterus, No thrush, No neck mass, [place]/AT Cardiovascular: IRRR, S1/S2, no rubs, no gallops Respiratory: Bibasilar rales.  Mild bibasilar wheeze. Abdomen: Soft/+BS, non tender, non distended, no guarding Extremities: 1 + LE edema, No lymphangitis, No petechiae, No rashes, no synovitis   Data Reviewed: I have personally reviewed following labs and imaging  studies Basic Metabolic Panel: Recent Labs  Lab 04/17/24 0358 04/18/24 0410 04/19/24 0407 04/20/24 0341 04/21/24 0322 04/22/24 0438  NA 139 138 134* 137 136 135  K 3.8 4.2 4.2 4.3 4.8 5.4*  CL 102 101 98 101 101 101  CO2 24 26 24 27 26 25   GLUCOSE 74 160* 162* 139* 165* 155*  BUN 37* 42* 41* 45* 49* 58*  CREATININE 2.60* 2.68* 2.62* 2.54* 2.65* 2.96*  CALCIUM  8.4* 8.4* 8.5* 8.3* 8.3* 8.3*  MG 2.4 2.4 2.3  --  2.5*  --    Liver Function Tests: Recent Labs  Lab 04/16/24 1029  AST 25  ALT 20  ALKPHOS 75  BILITOT 1.2  PROT 6.1*  ALBUMIN  3.2*   No results for input(s): LIPASE, AMYLASE in the last 168 hours. No results for input(s): AMMONIA in the last 168 hours. Coagulation Profile: No results for input(s): INR, PROTIME in the last 168 hours. CBC: Recent Labs  Lab 04/16/24 1029 04/17/24 0358 04/18/24 0410 04/19/24 0407 04/20/24 0341 04/21/24 0322  WBC 10.8* 12.6* 11.1* 10.2 10.2 11.8*  NEUTROABS 8.4* 9.5* 8.6* 7.7 7.4  --   HGB 13.1 13.5 12.8* 12.3* 12.0* 12.7*  HCT 41.2 42.4 41.0 38.5* 38.5* 40.5  MCV 92.6 92.2 92.1 92.3 93.2 93.1  PLT 206 203 172 155 151 145*   Cardiac Enzymes: Recent Labs  Lab 04/18/24 0410 04/19/24 0407 04/20/24 0341 04/21/24 0322  CKTOTAL 509* 690* 519* 348   BNP: Invalid input(s): POCBNP CBG: Recent Labs  Lab 04/21/24 0729 04/21/24 1057 04/21/24 1631 04/21/24 2135 04/22/24 0737  GLUCAP 154* 200* 205* 167* 171*   HbA1C: No results for input(s): HGBA1C in the last 72 hours. Urine analysis:    Component Value Date/Time   COLORURINE YELLOW 04/16/2024 1104   APPEARANCEUR CLEAR 04/16/2024 1104   LABSPEC 1.014 04/16/2024 1104   PHURINE 5.0 04/16/2024 1104   GLUCOSEU >=500 (A) 04/16/2024 1104   HGBUR NEGATIVE 04/16/2024 1104   BILIRUBINUR NEGATIVE 04/16/2024 1104   KETONESUR NEGATIVE 04/16/2024 1104   PROTEINUR NEGATIVE 04/16/2024 1104   UROBILINOGEN 0.2 02/04/2014 0939   NITRITE NEGATIVE 04/16/2024 1104    LEUKOCYTESUR NEGATIVE 04/16/2024 1104   Sepsis Labs: @LABRCNTIP (procalcitonin:4,lacticidven:4) ) Recent Results (from the past 240 hours)  Resp panel by RT-PCR (RSV, Flu A&B, Covid) Anterior Nasal Swab     Status: None   Collection Time: 04/16/24 11:04 AM   Specimen: Anterior Nasal Swab  Result Value Ref Range Status   SARS Coronavirus 2 by RT PCR NEGATIVE NEGATIVE Final    Comment: (NOTE) SARS-CoV-2 target nucleic acids are NOT DETECTED.  The SARS-CoV-2 RNA is generally detectable in upper respiratory specimens during the acute phase of infection. The lowest concentration of SARS-CoV-2 viral copies this assay can detect is 138 copies/mL. A negative result does not preclude SARS-Cov-2 infection and should not be used as the sole basis for treatment or other patient management decisions. A negative result may occur with  improper specimen collection/handling, submission of specimen other than nasopharyngeal swab, presence of viral mutation(s) within the areas targeted by this assay, and inadequate number of viral copies(<138 copies/mL). A negative result must be combined with clinical observations, patient history, and epidemiological information. The expected result is Negative.  Fact Sheet for Patients:  BloggerCourse.com  Fact Sheet for Healthcare Providers:  SeriousBroker.it  This test is no t yet approved or cleared by the [place]  FDA and  has been authorized for detection and/or diagnosis of SARS-CoV-2 by FDA under an Emergency Use Authorization (EUA). This EUA will remain  in effect (meaning this test can be used) for the duration of the COVID-19 declaration under Section 564(b)(1) of the Act, 21 U.S.C.section 360bbb-3(b)(1), unless the authorization is terminated  or revoked sooner.       Influenza A by PCR NEGATIVE NEGATIVE Final   Influenza B by PCR NEGATIVE NEGATIVE Final    Comment: (NOTE) The Xpert Xpress  SARS-CoV-2/FLU/RSV plus assay is intended as an aid in the diagnosis of influenza from Nasopharyngeal swab specimens and should not be used as a sole basis for treatment. Nasal washings and aspirates are unacceptable for Xpert Xpress SARS-CoV-2/FLU/RSV testing.  Fact Sheet for Patients: BloggerCourse.com  Fact Sheet for Healthcare Providers: SeriousBroker.it  This test is not yet approved or cleared by the [place]  FDA and has been authorized for detection and/or diagnosis of SARS-CoV-2 by FDA under an Emergency Use Authorization (EUA). This EUA will remain in effect (meaning this test can be used) for the duration of the COVID-19 declaration under Section 564(b)(1) of the Act, 21 U.S.C. section 360bbb-3(b)(1), unless the authorization is terminated or revoked.     Resp Syncytial Virus by PCR NEGATIVE NEGATIVE Final    Comment: (NOTE) Fact Sheet for  Patients: BloggerCourse.com  Fact Sheet for Healthcare Providers: SeriousBroker.it  This test is not yet approved or cleared by the [place]  FDA and has been authorized for detection and/or diagnosis of SARS-CoV-2 by FDA under an Emergency Use Authorization (EUA). This EUA will remain in effect (meaning this test can be used) for the duration of the COVID-19 declaration under Section 564(b)(1) of the Act, 21 U.S.C. section 360bbb-3(b)(1), unless the authorization is terminated or revoked.  Performed at [facility], [address], [place], [place] [address]   Respiratory (~20 pathogens) panel by PCR     Status: None   Collection Time: 04/18/24  4:55 PM   Specimen: Nasopharyngeal Swab; Respiratory  Result Value Ref Range Status   Adenovirus NOT DETECTED NOT DETECTED Final   Coronavirus 229E NOT DETECTED NOT DETECTED Final    Comment: (NOTE) The Coronavirus on the Respiratory Panel, DOES NOT test for the novel   Coronavirus (2019 nCoV)    Coronavirus HKU1 NOT DETECTED NOT DETECTED Final   Coronavirus NL63 NOT DETECTED NOT DETECTED Final   Coronavirus OC43 NOT DETECTED NOT DETECTED Final   Metapneumovirus NOT DETECTED NOT DETECTED Final   Rhinovirus / Enterovirus NOT DETECTED NOT DETECTED Final   Influenza A NOT DETECTED NOT DETECTED Final   Influenza B NOT DETECTED NOT DETECTED Final   Parainfluenza Virus 1 NOT DETECTED NOT DETECTED Final   Parainfluenza Virus 2 NOT DETECTED NOT DETECTED Final   Parainfluenza Virus 3 NOT DETECTED NOT DETECTED Final   Parainfluenza Virus 4 NOT DETECTED NOT DETECTED Final   Respiratory Syncytial Virus NOT DETECTED NOT DETECTED Final   Bordetella pertussis NOT DETECTED NOT DETECTED Final   Bordetella Parapertussis NOT DETECTED NOT DETECTED Final   Chlamydophila pneumoniae NOT DETECTED NOT DETECTED Final   Mycoplasma pneumoniae NOT DETECTED NOT DETECTED Final    Comment: Performed at [facility], [address] [place], [place] [address]     Scheduled Meds:  apixaban   2.5 mg Oral BID   arformoterol   15 mcg Nebulization BID   budesonide  (PULMICORT ) nebulizer solution  0.5 mg Nebulization BID   carvedilol   12.5 mg Oral BID WC   dapagliflozin  propanediol  10 mg Oral QHS   insulin  aspart  0-9 Units Subcutaneous TID WC   insulin  glargine-yfgn  8 Units Subcutaneous QHS   pantoprazole   40 mg Oral QAC breakfast   polyethylene glycol  17 g Oral Daily   senna  2 tablet Oral Daily   sodium chloride  flush  3 mL Intravenous Q12H   sodium zirconium cyclosilicate   10 g Oral Once   Continuous Infusions:  Procedures/Studies: DG CHEST PORT 1 VIEW Result Date: 04/19/2024 CLINICAL DATA:  [id number] Acute respiratory failure with hypoxia [facility]) [id number] Pulmonary edema [id number] EXAM: PORTABLE CHEST - 1 VIEW COMPARISON:  April 16, 2024, March 29, 2024 FINDINGS: Elevation of the right hemidiaphragm with lower lung volumes on the right. The most inferior aspect of the  left lung base are excluded from the field of view. Retrocardiac airspace opacities noted. No pneumothorax. Moderate cardiomegaly. Sternotomy wires and CABG markers. Left chest pacemaker with partially visualized leads in the region of the right atrium and right ventricle. Aortic atherosclerosis. No acute fracture or destructive lesions. Multilevel thoracic osteophytosis. IMPRESSION: The most inferior aspect of the left lung base and costophrenic sulcus are excluded from the field of view. Retrocardiac airspace opacities, may reflect atelectasis, bronchopneumonia, or asymmetric pulmonary edema. Electronically Signed   By: Rogelia Carlean HERO.D.  On: 04/19/2024 17:22   DG Chest Portable 1 View Result Date: 04/16/2024 CLINICAL DATA:  Weakness. EXAM: PORTABLE CHEST 1 VIEW COMPARISON:  March 29, 2024. FINDINGS: Stable cardiomegaly. Sternotomy wires are noted. Left-sided defibrillator is unchanged. Elevated right hemidiaphragm is noted. Left lung base is not completely included in field-of-view. Visualized lung parenchyma is unremarkable. Bony thorax is unremarkable. IMPRESSION: Left lung base is not completely included in field-of-view. No definite abnormality seen in the visualized lung parenchyma. Electronically Signed   By: Lynwood Landy Raddle M.D.   On: 04/16/2024 12:06   EP STUDY Result Date: 04/01/2024 See surgical note for result.  ECHOCARDIOGRAM COMPLETE Result Date: 03/30/2024    ECHOCARDIOGRAM REPORT   Patient Name:   Wayne Gordon Date of Exam: 03/30/2024 Medical Rec #:  [id number]      Height:       76.0 in Accession #:    [id number]     Weight:       242.9 lb Date of Birth:  Aug 14, 1934       BSA:          2.406 m Patient Age:    89 years       BP:           106/66 mmHg Patient Gender: M              HR:           80 bpm. Exam Location:  Inpatient Procedure: 2D Echo, Cardiac Doppler, Color Doppler and Intracardiac            Opacification Agent (Both Spectral and Color Flow Doppler were            utilized  during procedure). Indications:    I50.21 Acute systolic (congestive) heart failure  History:        Patient has prior history of Echocardiogram examinations, most                 recent 10/15/2021. CHF, CAD, Prior CABG and Defibrillator,                 Arrythmias:Atrial Fibrillation; Risk Factors:Hypertension and                 Diabetes.  Sonographer:    Damien Senior RDCS Referring Phys: [phone] JACOB J STINSON IMPRESSIONS  1. No LV thrombus by Definity . Left ventricular ejection fraction, by estimation, is 20 to 25%. The left ventricle has severely decreased function. The left ventricle demonstrates global hypokinesis. The left ventricular internal cavity size was mildly dilated. There is mild concentric left ventricular hypertrophy. Left ventricular diastolic parameters are indeterminate. There is the interventricular septum is flattened in systole and diastole, consistent with right ventricular pressure and volume overload.  2. Right ventricular systolic function is mildly reduced. The right ventricular size is moderately enlarged. There is normal pulmonary artery systolic pressure. The estimated right ventricular systolic pressure is 32.8 mmHg.  3. Left atrial size was mildly dilated.  4. S/p Sorin Memo 3D ring annuloplasty (size 30mm, Catalog #[id number], serial [id number]). The mitral valve has been repaired/replaced. Mild mitral valve regurgitation. No evidence of mitral stenosis. The mean mitral valve gradient is 3.0 mmHg with average heart rate of 85 bpm. Procedure Date: 10/17/11. Echo findings are consistent with normal structure and function of the mitral valve prosthesis.  5. The tricuspid valve is abnormal. Tricuspid valve regurgitation is moderate.  6. The aortic valve is tricuspid. There is moderate calcification of the aortic valve. There is  moderate thickening of the aortic valve. Aortic valve regurgitation is trivial. Aortic valve sclerosis/calcification is present, without any evidence of aortic stenosis.   7. The inferior vena cava is normal in size with <50% respiratory variability, suggesting right atrial pressure of 8 mmHg. FINDINGS  Left Ventricle: No LV thrombus by Definity . Left ventricular ejection fraction, by estimation, is 20 to 25%. The left ventricle has severely decreased function. The left ventricle demonstrates global hypokinesis. Definity  contrast agent was given IV to delineate the left ventricular endocardial borders. Strain was performed and the global longitudinal strain is indeterminate. The left ventricular internal cavity size was mildly dilated. There is mild concentric left ventricular hypertrophy. The interventricular septum is flattened in systole and diastole, consistent with right ventricular pressure and volume overload. Left ventricular diastolic function could not be evaluated due to mitral valve repair. Left ventricular diastolic parameters are  indeterminate. Right Ventricle: The right ventricular size is moderately enlarged. No increase in right ventricular wall thickness. Right ventricular systolic function is mildly reduced. There is normal pulmonary artery systolic pressure. The tricuspid regurgitant velocity is 2.49 m/s, and with an assumed right atrial pressure of 8 mmHg, the estimated right ventricular systolic pressure is 32.8 mmHg. Left Atrium: Left atrial size was mildly dilated. Right Atrium: Right atrial size was normal in size. Pericardium: There is no evidence of pericardial effusion. Mitral Valve: S/p Sorin Memo 3D ring annuloplasty (size 30mm, Catalog #[id number], serial [id number]). The mitral valve has been repaired/replaced. Mild mitral valve regurgitation. There is a 30 mm Mitral Memo ring prosthetic annuloplasty ring present in the mitral position. Echo findings are consistent with normal structure and function of the mitral valve prosthesis. No evidence of mitral valve stenosis. MV peak gradient, 6.6 mmHg. The mean mitral valve gradient is 3.0 mmHg with average heart  rate of 85 bpm. Tricuspid Valve: The tricuspid valve is abnormal. Tricuspid valve regurgitation is moderate. Aortic Valve: The aortic valve is tricuspid. There is moderate calcification of the aortic valve. There is moderate thickening of the aortic valve. Aortic valve regurgitation is trivial. Aortic valve sclerosis/calcification is present, without any evidence of aortic stenosis. Pulmonic Valve: The pulmonic valve was not well visualized. Pulmonic valve regurgitation is trivial. Aorta: The aortic root and ascending aorta are structurally normal, with no evidence of dilitation. Venous: The inferior vena cava is normal in size with less than 50% respiratory variability, suggesting right atrial pressure of 8 mmHg. IAS/Shunts: No atrial level shunt detected by color flow Doppler. Additional Comments: 3D was performed not requiring image post processing on an independent workstation and was indeterminate. A device lead is visualized in the right atrium and right ventricle.  LEFT VENTRICLE PLAX 2D LVIDd:         5.10 cm LVIDs:         4.80 cm LV PW:         1.20 cm LV IVS:        1.20 cm LVOT diam:     2.00 cm LV SV:         33 LV SV Index:   14 LVOT Area:     3.14 cm  LEFT ATRIUM              Index        RIGHT ATRIUM           Index LA diam:        4.60 cm  1.91 cm/m   RA Area:     18.60 cm LA  Vol (A2C):   107.0 ml 44.47 ml/m  RA Volume:   51.50 ml  21.40 ml/m LA Vol (A4C):   79.8 ml  33.17 ml/m LA Biplane Vol: 93.3 ml  38.78 ml/m  AORTIC VALVE LVOT Vmax:   54.30 cm/s LVOT Vmean:  37.400 cm/s LVOT VTI:    0.104 m  AORTA Ao Root diam: 3.30 cm Ao Asc diam:  3.70 cm MITRAL VALVE             TRICUSPID VALVE MV Area VTI:  1.34 cm   TR Peak grad:   24.8 mmHg MV Peak grad: 6.6 mmHg   TR Vmax:        249.00 cm/s MV Mean grad: 3.0 mmHg MV Vmax:      1.29 m/s   SHUNTS MV Vmean:     80.7 cm/s  Systemic VTI:  0.10 m                          Systemic Diam: 2.00 cm Vishnu Priya Mallipeddi Electronically signed by Diannah Late Mallipeddi Signature Date/Time: 03/30/2024/11:31:43 AM    Final    DG Chest Portable 1 View Result Date: 03/29/2024 CLINICAL DATA:  Weakness.  Dizziness. EXAM: PORTABLE CHEST 1 VIEW COMPARISON:  10/15/2021. FINDINGS: Low lung volume. Mild diffuse pulmonary vascular congestion, likely accentuated by low lung volume. No frank pulmonary edema. There are probable atelectatic changes at the lung bases. There is subtle blunting of bilateral lateral costophrenic angles, which may be due to trace pleural effusions versus pleural thickening. No pneumothorax on either side. Stable cardio-mediastinal silhouette. There are surgical staples along the heart border and sternotomy wires, status post CABG (coronary artery bypass graft). There is a left sided 3-lead pacemaker. Neurostimulator device noted overlying the lower thoracic spine region. No acute osseous abnormalities. The soft tissues are within normal limits. IMPRESSION: Mild diffuse pulmonary vascular congestion, likely accentuated by low lung volume. No frank pulmonary edema. Electronically Signed   By: Ree Molt M.D.   On: 03/29/2024 12:09    Alm Schneider, DO  [facility]  If 7PM-7AM, please contact night-coverage [email or web address] Password [email or web address] 04/22/2024, 9:52 AM   LOS: 6 days

## 2024-04-22 NOTE — TOC Progression Note (Signed)
 Transition of Care [facility]) - Progression Note    Patient Details  Name: Wayne Gordon MRN: [id number] Date of Birth: Dec 24, 1933  Transition of Care [facility]) CM/SW Contact  Lucie Lunger, [place] Phone Number: 04/22/2024, 10:14 AM  Clinical Narrative:    CSW updated pts spouse that [facility] offered a SNF bed for pt, she states they would like to accept the bed. Insurance shara has been approved for SNF at this time. TOC to follow.   Expected Discharge Plan: Home w Home Health Services Barriers to Discharge: Continued Medical Work up               Expected Discharge Plan and Services In-house Referral: Clinical Social Work Discharge Planning Services: CM Consult Post Acute Care Choice: Home Health Living arrangements for the past 2 months: Single Family Home                           HH Arranged: PT, OT HH Agency: [facility] Date [facility] Agency Contacted: 04/17/24   Representative spoke with at [facility] Agency: Darleene   Social Drivers of Health (SDOH) Interventions SDOH Screenings   Food Insecurity: No Food Insecurity (04/16/2024)  Housing: Low Risk  (04/16/2024)  Transportation Needs: No Transportation Needs (04/16/2024)  Utilities: Not At Risk (04/16/2024)  Financial Resource Strain: Medium Risk (09/05/2022)   Received from [facility]  Physical Activity: Unknown (09/05/2022)   Received from [facility]  Social Connections: Socially Integrated (04/16/2024)  Recent Concern: Social Connections - Moderately Isolated (03/29/2024)  Stress: No Stress Concern Present (09/05/2022)   Received from [facility]  Tobacco Use: Low Risk  (04/18/2024)  Health Literacy: Low Risk  (04/06/2020)   Received from [facility]    Readmission Risk Interventions    04/17/2024   11:19 AM 04/01/2024   10:47 AM 10/15/2021    9:16 AM  Readmission Risk Prevention Plan  Medication Screening   Complete  Transportation Screening Complete Complete Complete  PCP or Specialist Appt within 5-7  Days  Complete   Home Care Screening Complete Complete   Medication Review (RN CM) Complete Complete

## 2024-04-22 NOTE — Progress Notes (Signed)
 OT Cancellation Note  Patient Details Name: Wayne Gordon MRN: [id number] DOB: Mar 08, 1934   Cancelled Treatment:    Reason Eval/Treat Not Completed: Fatigue/lethargy limiting ability to participate. Pt very lethargic at this time. Unable to wake pt with sternum rubs. Pt worked with physical therapy earlier in the day and had reportedly been sleeping and not waking for awhile according to the family. RN notified of pt's level of lethargy. Will attempt to treat the pt later as time permits.   Renny Remer OT, MOT   Jayson Person 04/22/2024, 2:48 PM

## 2024-04-22 NOTE — Progress Notes (Addendum)
 Rounding Note   Patient Name: Wayne Gordon Date of Encounter: 04/22/2024  [facility] Cardiologist: Vishnu P Mallipeddi, MD   Subjective Patient reports improvement in breathing,  however nurse states that he seem more labored today. Also per nurse, Patient also appear very lethargic and less responsive. Patient was able to transfer from bed to chair yesterday without any concerns.   Scheduled Meds:  apixaban   2.5 mg Oral BID   arformoterol   15 mcg Nebulization BID   budesonide  (PULMICORT ) nebulizer solution  0.5 mg Nebulization BID   carvedilol   12.5 mg Oral BID WC   dapagliflozin  propanediol  10 mg Oral QHS   gabapentin   100 mg Oral QHS   insulin  aspart  0-9 Units Subcutaneous TID WC   insulin  glargine-yfgn  8 Units Subcutaneous QHS   pantoprazole   40 mg Oral QAC breakfast   polyethylene glycol  17 g Oral Daily   senna  2 tablet Oral Daily   sodium chloride  flush  3 mL Intravenous Q12H   sodium zirconium cyclosilicate   10 g Oral Once   Continuous Infusions:  PRN Meds: acetaminophen , guaiFENesin , ondansetron  (ZOFRAN ) IV, sodium chloride  flush   Vital Signs  Vitals:   04/21/24 1939 04/21/24 1956 04/22/24 0547 04/22/24 0709  BP:  (!) 101/48 (!) 135/105   Pulse:  80 85   Resp:   16   Temp:  97.7 F (36.5 C) 97.9 F (36.6 C)   TempSrc:  Oral Oral   SpO2: 95% 94% 90% 94%  Weight:      Height:        Intake/Output Summary (Last 24 hours) at 04/22/2024 0929 Last data filed at 04/22/2024 9147 Gross per 24 hour  Intake 483 ml  Output 400 ml  Net 83 ml      04/21/2024    5:00 AM 04/20/2024    4:35 AM 04/19/2024    4:56 AM  Last 3 Weights  Weight (lbs) 251 lb 8.7 oz 247 lb 9.2 oz 253 lb 8.5 oz  Weight (kg) 114.1 kg 112.3 kg 115 kg      Telemetry AV paced HR 70-90's - Personally Reviewed  Physical Exam GEN: No acute distress wearing 2L O2 Neck: No JVD Cardiac: irregular, no murmurs, rubs, or gallops.  Respiratory:  wheezing and rales in bilateral  lung bases.  GI: Soft, nontender, non-distended  MS: 1+ edema; No deformity. Neuro:  Nonfocal  Psych: Normal affect   Labs High Sensitivity Troponin:   Recent Labs  Lab 03/29/24 1120 03/29/24 1302 04/16/24 1029 04/16/24 1155  TROPONINIHS 19* 19* 25* 25*     Chemistry Recent Labs  Lab 04/16/24 1029 04/17/24 0358 04/18/24 0410 04/19/24 0407 04/20/24 0341 04/21/24 0322 04/22/24 0438  NA 136   < > 138 134* 137 136 135  K 3.4*   < > 4.2 4.2 4.3 4.8 5.4*  CL 99   < > 101 98 101 101 101  CO2 26   < > 26 24 27 26 25   GLUCOSE 130*   < > 160* 162* 139* 165* 155*  BUN 36*   < > 42* 41* 45* 49* 58*  CREATININE 2.75*   < > 2.68* 2.62* 2.54* 2.65* 2.96*  CALCIUM  8.2*   < > 8.4* 8.5* 8.3* 8.3* 8.3*  MG  --    < > 2.4 2.3  --  2.5*  --   PROT 6.1*  --   --   --   --   --   --  ALBUMIN  3.2*  --   --   --   --   --   --   AST 25  --   --   --   --   --   --   ALT 20  --   --   --   --   --   --   ALKPHOS 75  --   --   --   --   --   --   BILITOT 1.2  --   --   --   --   --   --   GFRNONAA 22*   < > 22* 23* 24* 22* 20*  ANIONGAP 11   < > 11 12 9 9 9    < > = values in this interval not displayed.    Lipids No results for input(s): CHOL, TRIG, HDL, LABVLDL, LDLCALC, CHOLHDL in the last 168 hours.  Hematology Recent Labs  Lab 04/19/24 0407 04/20/24 0341 04/21/24 0322  WBC 10.2 10.2 11.8*  RBC 4.17* 4.13* 4.35  HGB 12.3* 12.0* 12.7*  HCT 38.5* 38.5* 40.5  MCV 92.3 93.2 93.1  MCH 29.5 29.1 29.2  MCHC 31.9 31.2 31.4  RDW 15.5 15.2 15.3  PLT 155 151 145*   Thyroid  Recent Labs  Lab 04/18/24 0410  TSH 4.368  FREET4 2.02*    BNP Recent Labs  Lab 04/16/24 1029 04/17/24 0358  BNP 681.0* 927.0*    DDimer No results for input(s): DDIMER in the last 168 hours.   Radiology  No results found.  Cardiac Studies Lexiscan  07/2015 Defect 1: There is a large defect of severe severity present in the basal inferior, basal inferolateral, basal anterolateral, mid  inferior, mid inferolateral, mid anterolateral, apical inferior and apical lateral location. Findings consistent with prior myocardial infarction. This is a high risk study. Nuclear stress EF: 29%. The aforementioned walls are severely hypokinetic to akinetic   ECHO IMPRESSIONS 03/30/2024  1. No LV thrombus by Definity . Left ventricular ejection fraction, by  estimation, is 20 to 25%. The left ventricle has severely decreased  function. The left ventricle demonstrates global hypokinesis. The left  ventricular internal cavity size was mildly  dilated. There is mild concentric left ventricular hypertrophy. Left  ventricular diastolic parameters are indeterminate. There is the  interventricular septum is flattened in systole and diastole, consistent  with right ventricular pressure and volume  overload.   2. Right ventricular systolic function is mildly reduced. The right  ventricular size is moderately enlarged. There is normal pulmonary artery  systolic pressure. The estimated right ventricular systolic pressure is  32.8 mmHg.   3. Left atrial size was mildly dilated.   4. S/p Sorin Memo 3D ring annuloplasty (size 30mm, Catalog #[id number], serial  [id number]). The mitral valve has been repaired/replaced. Mild mitral valve  regurgitation. No evidence of mitral stenosis. The mean mitral valve  gradient is 3.0 mmHg with average  heart rate of 85 bpm. Procedure Date: 10/17/11. Echo findings are  consistent with normal structure and function of the mitral valve  prosthesis.   5. The tricuspid valve is abnormal. Tricuspid valve regurgitation is  moderate.   6. The aortic valve is tricuspid. There is moderate calcification of the  aortic valve. There is moderate thickening of the aortic valve. Aortic  valve regurgitation is trivial. Aortic valve sclerosis/calcification is  present, without any evidence of  aortic stenosis.   7. The inferior vena cava is normal in size with <50% respiratory  variability, suggesting right atrial pressure of 8 mmHg.   Patient Profile   89 y.o. male  with a hx of CAD s/p CABG x 3 with MVR 2013, ICM with baseline EF 20-25%, VT, sinus node dysfunction s/p ICD 2013 with upgrade of Biotronik BiV ICD 09/2020, persistent atrial fibrillation/flutter s/p successful DCCV 04/01/2024, HTN, HLD, DM II, stage III CKD, Carotid Artery disease,  vertigo who is being seen 04/17/2024 for the evaluation of acute HF at the request of Dr. Evonnie.   Assessment & Plan  Acute on chronic HFrEF ECHO: 03/30/2024 LVEF 20 to 25%, severely decreased LV function, global hypokinesis, mildly dilated LV, mildly concentric LVH, C/W RV overload, mildly reduced RV function, moderately enlarged RV, mildly dilated LA BNP 681 > 927, WBC 10.8 > 12.6 with neutrophils 8.4 > 9.5 Reports improvement in breathing, however nurse states that he seem more labored today. RR 22-24.  I/O -5108, Cr 2.75 > 2.96, Wt 244 > 251 lbs (8/4 234, 8/14 248), Reds vest poor quality. Suspect weight is inaccurate.  Treated with Lasix  40 mg IV BID to TID then increased to 80 mg IV TID. Switched to PO Torsemide  40 mg but d/c this am due to elevated Cr. Would await Palliative medicine consult before restarting diuretic.  Still appear volume overload but would continue to hold diuretic with uptrend in Cr. Can consider nephrology consult.  GDMT limited due to his current kidney disease.   Continue on Farxiga  10 mg daily and carvedilol  12.5 mg twice daily Not on ACE/ARB/MRA/ARNI due to CKD stage IIIb-IV.  Most likely secondary to kidney disease and underlying unknown infection.     CAD s/p CABG x 3  HLD, LDL goal <55 Lexiscan  07/2015: High risk studies with findings consistent with prior MI EKG without any ischemic changes.  TN 25 > 25. Most likely demand ischemia.  Stable with no anginal symptoms.  No indication for ischemic evaluation at this time. 05/2023 LDL 69 Not on ASA due to being on Eliquis  Continue Crestor  5 mg     Hx of MV repair, valvular insufficiency ECHO: 03/30/2024 MV prosthesis well-seated and normal structure/function with mild MV regurgitation,  moderate TV regurgitation, moderate calcification/thickening of AV without AS, trivial AV regurgitation Continue to monitor   HTN BP well controlled today: 113/76 Managed by GDMT as above.    Persistent Afib  S/p successful DCCV 04/01/2024 No signs of recurrent A-fib on telemetry. Tele shows AV paced with HR 70-90's.  On exam HR seem irregular. Order 12 lead EKG.  Denies palpitations or active bleeding.  Continue on Eliquis  2.5 mg twice daily, Coreg  12.5 mg twice daily Previously on amiodarone  however had to be discontinued due to worsening dizziness.    Sinus node dysfunction s/p ICD 2013 with upgrade of Biotronik BiV ICD 09/2020 Device interrogation 02/2024: AP/VP rhythm with no treated arrhythmias Continue to follow with EP   CKD Stage IV  Initially CR 2.75 now 2.96 Continue to follow Cr trend Encourage to follow up with referral to nephrology, Dr. Rachele.   Lethargy  Less responsive  Failure to thrive  CT head pending  Awaiting Palliative medicine consult Managed by primary team    For questions or updates, please contact [facility] Please consult [email or web address] for contact info under     Signed, Lorette CINDERELLA Kapur, PA-C  04/22/2024, 9:29 AM     1.Acute on chronic combined systolic/diastolic HF - 09/2021 echo: LVE 25% - 03/2024 echo: LVEF 20-25%, indet diastolic function, D shaped septum,  mild RV dysfunction - presented with 13 lbs weight gain - CXR no acute process. BNP 927  - had been on IV lasix  80mg  tid, last dose yesterday AM. Received oral torsemide  40mg  x 1 yesterday.  I/Os are incomplete this admission. Weight appear inaccurate. Uptrend in Cr, diuretic held.  - medical therapy with coreg  12.5mg  bid, farxiga  10mg . In general medical therapy limited by renal dysfunction, soft bp's at times. - check standing weight  when able, patient lethargic this AM hold for today.    2. CAD with prior CABG - no acute issues this admission.    3. Persistent afib - prior DCCV 03/2024 - on coreg , eliquis .   4. AMS - per primary team   Dorn Ross MD

## 2024-04-23 DIAGNOSIS — J181 Lobar pneumonia, unspecified organism: Secondary | ICD-10-CM | POA: Diagnosis not present

## 2024-04-23 DIAGNOSIS — Z7189 Other specified counseling: Secondary | ICD-10-CM | POA: Diagnosis not present

## 2024-04-23 DIAGNOSIS — J9601 Acute respiratory failure with hypoxia: Secondary | ICD-10-CM | POA: Diagnosis not present

## 2024-04-23 DIAGNOSIS — I5021 Acute systolic (congestive) heart failure: Secondary | ICD-10-CM | POA: Diagnosis not present

## 2024-04-23 DIAGNOSIS — Z515 Encounter for palliative care: Secondary | ICD-10-CM | POA: Diagnosis not present

## 2024-04-23 DIAGNOSIS — I5043 Acute on chronic combined systolic (congestive) and diastolic (congestive) heart failure: Secondary | ICD-10-CM | POA: Diagnosis not present

## 2024-04-23 DIAGNOSIS — N178 Other acute kidney failure: Secondary | ICD-10-CM | POA: Diagnosis not present

## 2024-04-23 LAB — CBC
HCT: 38.9 % — ABNORMAL LOW (ref 39.0–52.0)
Hemoglobin: 12 g/dL — ABNORMAL LOW (ref 13.0–17.0)
MCH: 28.8 pg (ref 26.0–34.0)
MCHC: 30.8 g/dL (ref 30.0–36.0)
MCV: 93.3 fL (ref 80.0–100.0)
Platelets: 178 K/uL (ref 150–400)
RBC: 4.17 MIL/uL — ABNORMAL LOW (ref 4.22–5.81)
RDW: 15.4 % (ref 11.5–15.5)
WBC: 11.3 K/uL — ABNORMAL HIGH (ref 4.0–10.5)
nRBC: 0 % (ref 0.0–0.2)

## 2024-04-23 LAB — BASIC METABOLIC PANEL WITH GFR
Anion gap: 11 (ref 5–15)
BUN: 64 mg/dL — ABNORMAL HIGH (ref 8–23)
CO2: 26 mmol/L (ref 22–32)
Calcium: 8.3 mg/dL — ABNORMAL LOW (ref 8.9–10.3)
Chloride: 100 mmol/L (ref 98–111)
Creatinine, Ser: 2.91 mg/dL — ABNORMAL HIGH (ref 0.61–1.24)
GFR, Estimated: 20 mL/min — ABNORMAL LOW (ref >60)
Glucose, Bld: 124 mg/dL — ABNORMAL HIGH (ref 70–99)
Potassium: 4.6 mmol/L (ref 3.5–5.1)
Sodium: 137 mmol/L (ref 135–145)

## 2024-04-23 LAB — GLUCOSE, CAPILLARY
Glucose-Capillary: 120 mg/dL — ABNORMAL HIGH (ref 70–99)
Glucose-Capillary: 121 mg/dL — ABNORMAL HIGH (ref 70–99)
Glucose-Capillary: 116 mg/dL — ABNORMAL HIGH (ref 70–99)
Glucose-Capillary: 177 mg/dL — ABNORMAL HIGH (ref 70–99)
Glucose-Capillary: 148 mg/dL — ABNORMAL HIGH (ref 70–99)

## 2024-04-23 LAB — MAGNESIUM: Magnesium: 2.5 mg/dL — ABNORMAL HIGH (ref 1.7–2.4)

## 2024-04-23 NOTE — Progress Notes (Signed)
 Physical Therapy Treatment Patient Details Name: Wayne Gordon MRN: [id number] DOB: [birth year] Today's Date: 04/23/2024   History of Present Illness Wayne Gordon is a 89 year old male with HFrEF, ischemic cardiomyopathy, LVEF 20-25%, status post ICD, pacer placement, stage IV CKD, atrial fibrillation/atrial flutter on chronic anticoagulation with apixaban , vertigo, osteoarthritis, type 2 diabetes mellitus on insulin , with renal complications, hyperlipidemia, hypertension, coronary artery disease, status post mitral valve repair, recent hospitalizations, recently discharged 04/01/2024 with a discharge weight of 234 pounds at that time.  EP had performed a cardioversion on 8/4 for symptomatic atrial flutter.  Since hospital discharge, his weight has steadily climbed despite taking oral diuretics at home.  He is now up to 247 pounds.  He is increasingly becoming weaker.  He saw cardiology in the outpatient setting recently and was taken off amiodarone .  He was brought into the emergency department because he continued to have swelling and weight gain in his legs.  He complained of severe fatigue and weakness and was unable to get out of bed this morning because he was so weak.  He was advised by the cardiology office to call EMS and come to the ED.  He was noted to be severely deconditioned and frail with significant peripheral edema, elevated BNP of 681, troponin 25, potassium 3.4, creatinine 2.75 and request was made for inpatient hospitalization for IV diuresis as he is failing outpatient diuresis with oral medication likely due to advanced renal failure.    PT Comments  Patient presents seated in chair (assisted by OT) and agreeable for therapy.  Patient demonstrates labored movement for completing sit to stands during transfers from bed to wheelchair, increased endurance/distance for gait training followed with wheelchair for safety, required 2 sitting rest breaks before having to be rolled back to room  due to fatigue and SpO2 dropping from 92% to 88% while on 2 LPM O2. Patient tolerated sitting up in chair after therapy with his SpO2 at 93% while on 2 LPM and his family member present. Patient will benefit from continued skilled physical therapy in hospital and recommended venue below to increase strength, balance, endurance for safe ADLs and gait.     If plan is discharge home, recommend the following: A lot of help with walking and/or transfers;A lot of help with bathing/dressing/bathroom;Help with stairs or ramp for entrance;Assistance with cooking/housework;Assist for transportation   Can travel by private vehicle     No  Equipment Recommendations  None recommended by PT    Recommendations for Other Services       Precautions / Restrictions Precautions Precautions: Fall Recall of Precautions/Restrictions: Intact Restrictions Weight Bearing Restrictions Per Provider Order: No     Mobility  Bed Mobility               General bed mobility comments: Patient presents seated in chair (assisted by OT)    Transfers Overall transfer level: Needs assistance Equipment used: Rolling walker (2 wheels) Transfers: Sit to/from Stand, Bed to chair/wheelchair/BSC Sit to Stand: Mod assist   Step pivot transfers: Mod assist       General transfer comment: unsteady labored movement    Ambulation/Gait Ambulation/Gait assistance: Mod assist Gait Distance (Feet): 20 Feet Assistive device: Rolling walker (2 wheels) Gait Pattern/deviations: Decreased step length - right, Decreased step length - left, Decreased stride length, Narrow base of support Gait velocity: decreased     General Gait Details: increased endurance/distance for gait training with slow labored movment, followed with wheelchair for safety and limited mostly  due to fatigue and mild difficulty breathing with SpO2 dropping from 92% to 88% while on 2 LPM O2   Stairs             Wheelchair Mobility      Tilt Bed    Modified Rankin (Stroke Patients Only)       Balance Overall balance assessment: Needs assistance Sitting-balance support: Feet supported, No upper extremity supported Sitting balance-Leahy Scale: Fair Sitting balance - Comments: seated in chair   Standing balance support: During functional activity, Reliant on assistive device for balance, Bilateral upper extremity supported Standing balance-Leahy Scale: Fair Standing balance comment: fair/poor using RW                            Communication Communication Communication: No apparent difficulties  Cognition Arousal: Alert Behavior During Therapy: WFL for tasks assessed/performed   PT - Cognitive impairments: No apparent impairments                         Following commands: Intact      Cueing Cueing Techniques: Verbal cues, Visual cues, Tactile cues  Exercises      General Comments        Pertinent Vitals/Pain Pain Assessment Pain Assessment: No/denies pain    Home Living                          Prior Function            PT Goals (current goals can now be found in the care plan section) Acute Rehab PT Goals Patient Stated Goal: Return home PT Goal Formulation: With patient Time For Goal Achievement: 05/01/24 Potential to Achieve Goals: Good Progress towards PT goals: Progressing toward goals    Frequency    Min 3X/week      PT Plan      Co-evaluation PT/OT/SLP Co-Evaluation/Treatment: Yes Reason for Co-Treatment: To address functional/ADL transfers PT goals addressed during session: Mobility/safety with mobility;Balance;Proper use of DME        AM-PAC PT 6 Clicks Mobility   Outcome Measure  Help needed turning from your back to your side while in a flat bed without using bedrails?: A Lot Help needed moving from lying on your back to sitting on the side of a flat bed without using bedrails?: A Lot Help needed moving to and from a bed to  a chair (including a wheelchair)?: A Lot Help needed standing up from a chair using your arms (e.g., wheelchair or bedside chair)?: A Lot Help needed to walk in hospital room?: A Lot Help needed climbing 3-5 steps with a railing? : Total 6 Click Score: 11    End of Session Equipment Utilized During Treatment: Oxygen  Activity Tolerance: Patient tolerated treatment well;Patient limited by fatigue Patient left: in chair;with call bell/phone within reach;with family/visitor present Nurse Communication: Mobility status PT Visit Diagnosis: Unsteadiness on feet (R26.81);Other abnormalities of gait and mobility (R26.89);Muscle weakness (generalized) (M62.81);Difficulty in walking, not elsewhere classified (R26.2)     Time: 9088-9054 PT Time Calculation (min) (ACUTE ONLY): 34 min  Charges:    $Gait Training: 8-22 mins $Therapeutic Activity: 8-22 mins PT General Charges $$ ACUTE PT VISIT: 1 Visit                     10:43 AM, 04/23/24 Lynwood Music, MPT Physical Therapist with [facility] [phone]  office [phone] mobile phone

## 2024-04-23 NOTE — Progress Notes (Signed)
 PROGRESS NOTE  Wayne Gordon FMW:[id number] DOB: 1933-12-10 DOA: 04/16/2024 PCP: Renato Dorothey HERO, NP  Brief History:  89 year old male with HFrEF, CAD status post CABG and mitral valve repair 2013, LVEF 20-25%, status post ICD, pacer placement, stage IV CKD, persistent atrial fibrillation/atrial flutter on chronic anticoagulation with apixaban  status post DCCV 84/25, ype 2 diabetes mellitus on insulin , with renal complications, hyperlipidemia, hypertension, coronary artery disease, status post mitral valve repair, recent hospitalizations, recently discharged 04/01/2024 with a discharge weight of 234 pounds at that time.  EP had performed a cardioversion on 8/4 for symptomatic atrial flutter.  The patient was admitted to the hospital from 03/29/2024 to 04/01/2024 for acute on chronic HFrEF.   He was discharged home on furosemide  40 mg p.o. daily.  Since hospital discharge, his weight has steadily climbed despite taking oral diuretics at home.  He is now up to 247 pounds.  He is increasingly becoming weaker.  He saw cardiology in the outpatient setting recently and was taken off amiodarone  04/05/24 due to patient report of arm and leg weakness.  He was brought into the emergency department because he continued to have swelling and weight gain in his legs and worsening shortness of breath.  He complained of severe fatigue and weakness and was unable to get out of bed this morning because he was so weak.  He was advised by the cardiology office to call EMS and come to the ED.  He was noted to be severely deconditioned and frail with significant peripheral edema, elevated BNP of 681, troponin 25, potassium 3.4, creatinine 2.75 and request was made for inpatient hospitalization for IV diuresis as he is failing outpatient diuresis with oral medication in part due to advanced renal failure.  The patient is show some initial clinical improvement.  Unfortunately, he continued to remain severely deconditioned.   Although his I's and O's were incomplete, he did show ample diuresis.  Unfortunately, he developed acute on chronic renal failure in part due to cardiorenal syndrome.  He developed new oxygen  requirement on 2 L.  He was somewhat somnolent throughout the hospitalization but aroused and follow commands.  On 04/22/2024, the patient was more somnolent.  Further workup was initiated.  His diuretics were placed on hold secondary to uptrend of his serum creatinine.  Palliative medicine was consulted to initiate goals of care discussions.   Assessment/Plan: Acute on chronic HFrEF - 03/30/2024 echo EF 20-25%, global HK, mild decreased RVF, s/p MV annuloplasty - Continue IV furosemide --increased to 80 mg IV  TID - 8/24--transitioned to po torsemide  - 04/22/24 diuretics on hold due to uptrend in creatinine - Cardiology consult appreciated - Daily weights--?accuracy - Accurate I's and O's--NEG 6L - 04/01/2024 discharge weight 234 lbs - Admission weight 247 lbs   Persistent atrial fibrillation - Status post DCCV 04/01/2024 - Continue carvedilol >>holding due to soft BPs now - Continue apixaban  - Previously on amiodarone , but discontinued secondary to dizziness and generalized weakness - remains rate controlled   Acute on chronic CKD stage IV - Baseline creatinine 1.9-2.1 - serum creatinine now up to 2.96 - serum creatinine trending up>>hold diuretics 8/25>> - Secondary to cardiorenal syndrome   Acute respiratory failure with hypoxia -he is now on 2L - due to pulmonary edema and pneumonia -PCT 0.49 - 04/22/24 CT chest--Areas of right lower lobe consolidation and volume loss; Mild patchy interstitial prominence in the superior segment right lower lobe, posterior right upper lobe and in  the left lower lobe     Acute Metabolic Encephalopathy -04/22/24--more somnolent - due to pneumonia and acute on chronic renal failure -repeat UA--neg for pyuria - 04/22/24 ABG--7.34/51/69/27 on 2L -CT  brain--neg -ammonia--33 - B12--1326 - Folic acid--19.7 - TSH--4.368 - CK 509>>690>>519>>348 - 04/23/24--mental status much improved, but not back to baseline   Coronary artery disease /elevated troponin - Status post CABG -Secondary to demand ischemia - No angina - holding Crestor  temporarily due to elevated CK - Currently on apixaban    Diabetes mellitus type 2, uncontrolled with hyperglycemia - 03/29/2024 hemoglobin A1c 7.7 - Continue NovoLog  sliding scale - Continue Semglee  reduced dose - He is on Toujeo  26 units daily at home   Generalized weakness - Secondary to decompensated heart failure and electrolyte derangements and AKI - B12--1326 - Folic acid--19.7 - TSH--4.368 - PT evaluation>>SNF - UA no pyuria - CK 509>>690>>519   Hypokalemia>>hyperkalemia - Repleted - Check magnesium --2.4 - 04/22/24--give lokelma  x 1   Mixed hyperlipidemia - holding statin temporarily due to elevated CK   Sinus node dysfunction s/p ICD 2013 with upgrade of Biotronik BiV ICD 09/2020 - Device interrogation 02/2024: AP/VP rhythm with no treated arrhythmias - Continue to follow with EP  Goals of Care -palliative following -DNR/DNI      Family Communication:   daughter at bedside 8/26   Consultants:  cardiology   Code Status:  DNR   DVT Prophylaxis:  apixaban      Procedures: As Listed in Progress Note Above   Antibiotics: Cefepime  8/25>> Doxy 8/25>>         Subjective: Patient denies fevers, chills, headache, chest pain, dyspnea, nausea, vomiting, diarrhea, abdominal pain,    Objective: Vitals:   04/23/24 0800 04/23/24 0834 04/23/24 1410 04/23/24 1740  BP:  (!) 95/55 115/62 (!) 99/49  Pulse:  88 91 78  Resp:  19    Temp:  97.9 F (36.6 C) 98.1 F (36.7 C)   TempSrc:  Oral Oral   SpO2: 93% 93% 92%   Weight:      Height:        Intake/Output Summary (Last 24 hours) at 04/23/2024 1805 Last data filed at 04/23/2024 1700 Gross per 24 hour  Intake 103 ml   Output 1100 ml  Net -997 ml   Weight change:  Exam:  General:  Pt is alert, follows commands appropriately, not in acute distress HEENT: No icterus, No thrush, No neck mass, [place]/AT Cardiovascular: RRR, S1/S2, no rubs, no gallops Respiratory: bilateral scattered rales R>L.  No wheeze Abdomen: Soft/+BS, non tender, non distended, no guarding Extremities: 1 + LE edema, No lymphangitis, No petechiae, No rashes, no synovitis   Data Reviewed: I have personally reviewed following labs and imaging studies Basic Metabolic Panel: Recent Labs  Lab 04/17/24 0358 04/18/24 0410 04/19/24 0407 04/20/24 0341 04/21/24 0322 04/22/24 0438 04/22/24 1400 04/23/24 0418  NA 139 138 134* 137 136 135  --  137  K 3.8 4.2 4.2 4.3 4.8 5.4* 4.8 4.6  CL 102 101 98 101 101 101  --  100  CO2 24 26 24 27 26 25   --  26  GLUCOSE 74 160* 162* 139* 165* 155*  --  124*  BUN 37* 42* 41* 45* 49* 58*  --  64*  CREATININE 2.60* 2.68* 2.62* 2.54* 2.65* 2.96*  --  2.91*  CALCIUM  8.4* 8.4* 8.5* 8.3* 8.3* 8.3*  --  8.3*  MG 2.4 2.4 2.3  --  2.5*  --   --  2.5*  Liver Function Tests: Recent Labs  Lab 04/22/24 0954  AST 55*  ALT 44  ALKPHOS 94  BILITOT 2.2*  PROT 6.2*  ALBUMIN  3.0*   No results for input(s): LIPASE, AMYLASE in the last 168 hours. Recent Labs  Lab 04/22/24 0954  AMMONIA 33   Coagulation Profile: No results for input(s): INR, PROTIME in the last 168 hours. CBC: Recent Labs  Lab 04/17/24 0358 04/18/24 0410 04/19/24 0407 04/20/24 0341 04/21/24 0322 04/22/24 0954 04/23/24 0418  WBC 12.6* 11.1* 10.2 10.2 11.8* 13.3* 11.3*  NEUTROABS 9.5* 8.6* 7.7 7.4  --   --   --   HGB 13.5 12.8* 12.3* 12.0* 12.7* 13.2 12.0*  HCT 42.4 41.0 38.5* 38.5* 40.5 41.8 38.9*  MCV 92.2 92.1 92.3 93.2 93.1 93.1 93.3  PLT 203 172 155 151 145* 169 178   Cardiac Enzymes: Recent Labs  Lab 04/18/24 0410 04/19/24 0407 04/20/24 0341 04/21/24 0322  CKTOTAL 509* 690* 519* 348   BNP: Invalid  input(s): POCBNP CBG: Recent Labs  Lab 04/22/24 2044 04/23/24 0306 04/23/24 0735 04/23/24 1054 04/23/24 1558  GLUCAP 159* 120* 121* 116* 177*   HbA1C: No results for input(s): HGBA1C in the last 72 hours. Urine analysis:    Component Value Date/Time   COLORURINE YELLOW 04/22/2024 2005   APPEARANCEUR CLEAR 04/22/2024 2005   LABSPEC 1.010 04/22/2024 2005   PHURINE 5.0 04/22/2024 2005   GLUCOSEU >=500 (A) 04/22/2024 2005   HGBUR NEGATIVE 04/22/2024 2005   BILIRUBINUR NEGATIVE 04/22/2024 2005   KETONESUR NEGATIVE 04/22/2024 2005   PROTEINUR NEGATIVE 04/22/2024 2005   UROBILINOGEN 0.2 02/04/2014 0939   NITRITE NEGATIVE 04/22/2024 2005   LEUKOCYTESUR NEGATIVE 04/22/2024 2005   Sepsis Labs: @LABRCNTIP (procalcitonin:4,lacticidven:4) ) Recent Results (from the past 240 hours)  Resp panel by RT-PCR (RSV, Flu A&B, Covid) Anterior Nasal Swab     Status: None   Collection Time: 04/16/24 11:04 AM   Specimen: Anterior Nasal Swab  Result Value Ref Range Status   SARS Coronavirus 2 by RT PCR NEGATIVE NEGATIVE Final    Comment: (NOTE) SARS-CoV-2 target nucleic acids are NOT DETECTED.  The SARS-CoV-2 RNA is generally detectable in upper respiratory specimens during the acute phase of infection. The lowest concentration of SARS-CoV-2 viral copies this assay can detect is 138 copies/mL. A negative result does not preclude SARS-Cov-2 infection and should not be used as the sole basis for treatment or other patient management decisions. A negative result may occur with  improper specimen collection/handling, submission of specimen other than nasopharyngeal swab, presence of viral mutation(s) within the areas targeted by this assay, and inadequate number of viral copies(<138 copies/mL). A negative result must be combined with clinical observations, patient history, and epidemiological information. The expected result is Negative.  Fact Sheet for Patients:   BloggerCourse.com  Fact Sheet for Healthcare Providers:  SeriousBroker.it  This test is no t yet approved or cleared by the [place]  FDA and  has been authorized for detection and/or diagnosis of SARS-CoV-2 by FDA under an Emergency Use Authorization (EUA). This EUA will remain  in effect (meaning this test can be used) for the duration of the COVID-19 declaration under Section 564(b)(1) of the Act, 21 U.S.C.section 360bbb-3(b)(1), unless the authorization is terminated  or revoked sooner.       Influenza A by PCR NEGATIVE NEGATIVE Final   Influenza B by PCR NEGATIVE NEGATIVE Final    Comment: (NOTE) The Xpert Xpress SARS-CoV-2/FLU/RSV plus assay is intended as an aid in the diagnosis of  influenza from Nasopharyngeal swab specimens and should not be used as a sole basis for treatment. Nasal washings and aspirates are unacceptable for Xpert Xpress SARS-CoV-2/FLU/RSV testing.  Fact Sheet for Patients: BloggerCourse.com  Fact Sheet for Healthcare Providers: SeriousBroker.it  This test is not yet approved or cleared by the [place]  FDA and has been authorized for detection and/or diagnosis of SARS-CoV-2 by FDA under an Emergency Use Authorization (EUA). This EUA will remain in effect (meaning this test can be used) for the duration of the COVID-19 declaration under Section 564(b)(1) of the Act, 21 U.S.C. section 360bbb-3(b)(1), unless the authorization is terminated or revoked.     Resp Syncytial Virus by PCR NEGATIVE NEGATIVE Final    Comment: (NOTE) Fact Sheet for Patients: BloggerCourse.com  Fact Sheet for Healthcare Providers: SeriousBroker.it  This test is not yet approved or cleared by the [place]  FDA and has been authorized for detection and/or diagnosis of SARS-CoV-2 by FDA under an Emergency Use  Authorization (EUA). This EUA will remain in effect (meaning this test can be used) for the duration of the COVID-19 declaration under Section 564(b)(1) of the Act, 21 U.S.C. section 360bbb-3(b)(1), unless the authorization is terminated or revoked.  Performed at [facility], [address], [place], [place] [address]   Respiratory (~20 pathogens) panel by PCR     Status: None   Collection Time: 04/18/24  4:55 PM   Specimen: Nasopharyngeal Swab; Respiratory  Result Value Ref Range Status   Adenovirus NOT DETECTED NOT DETECTED Final   Coronavirus 229E NOT DETECTED NOT DETECTED Final    Comment: (NOTE) The Coronavirus on the Respiratory Panel, DOES NOT test for the novel  Coronavirus (2019 nCoV)    Coronavirus HKU1 NOT DETECTED NOT DETECTED Final   Coronavirus NL63 NOT DETECTED NOT DETECTED Final   Coronavirus OC43 NOT DETECTED NOT DETECTED Final   Metapneumovirus NOT DETECTED NOT DETECTED Final   Rhinovirus / Enterovirus NOT DETECTED NOT DETECTED Final   Influenza A NOT DETECTED NOT DETECTED Final   Influenza B NOT DETECTED NOT DETECTED Final   Parainfluenza Virus 1 NOT DETECTED NOT DETECTED Final   Parainfluenza Virus 2 NOT DETECTED NOT DETECTED Final   Parainfluenza Virus 3 NOT DETECTED NOT DETECTED Final   Parainfluenza Virus 4 NOT DETECTED NOT DETECTED Final   Respiratory Syncytial Virus NOT DETECTED NOT DETECTED Final   Bordetella pertussis NOT DETECTED NOT DETECTED Final   Bordetella Parapertussis NOT DETECTED NOT DETECTED Final   Chlamydophila pneumoniae NOT DETECTED NOT DETECTED Final   Mycoplasma pneumoniae NOT DETECTED NOT DETECTED Final    Comment: Performed at [facility], [address], [place], [place] [address]  MRSA Next Gen by PCR, Nasal     Status: None   Collection Time: 04/22/24  5:58 PM   Specimen: Nasal Mucosa; Nasal Swab  Result Value Ref Range Status   MRSA by PCR Next Gen NOT DETECTED NOT DETECTED Final    Comment: (NOTE) The GeneXpert MRSA  Assay (FDA approved for NASAL specimens only), is one component of a comprehensive MRSA colonization surveillance program. It is not intended to diagnose MRSA infection nor to guide or monitor treatment for MRSA infections. Test performance is not FDA approved in patients less than 55 years old. Performed at [facility], [address], [place], [place] [address]      Scheduled Meds:  apixaban   2.5 mg Oral BID   arformoterol   15 mcg Nebulization BID   budesonide  (PULMICORT ) nebulizer solution  0.5  mg Nebulization BID   carvedilol   12.5 mg Oral BID WC   dapagliflozin  propanediol  10 mg Oral QHS   doxycycline   100 mg Oral Q12H   insulin  aspart  0-9 Units Subcutaneous TID WC   insulin  glargine-yfgn  8 Units Subcutaneous QHS   pantoprazole   40 mg Oral QAC breakfast   polyethylene glycol  17 g Oral Daily   senna  2 tablet Oral Daily   sodium chloride  flush  3 mL Intravenous Q12H   Continuous Infusions:  ceFEPime  (MAXIPIME ) IV 2 g (04/22/24 1847)    Procedures/Studies: CT HEAD WO CONTRAST ( ) Result Date: 04/22/2024 CLINICAL DATA:  Initial evaluation for mental status change, unknown cause. EXAM: CT HEAD WITHOUT CONTRAST TECHNIQUE: Contiguous axial images were obtained from the base of the skull through the vertex without intravenous contrast. RADIATION DOSE REDUCTION: This exam was performed according to the departmental dose-optimization program which includes automated exposure control, adjustment of the mA and/or kV according to patient size and/or use of iterative reconstruction technique. COMPARISON:  CT from 07/10/2015. FINDINGS: Brain: Cerebral volume within normal limits. Mild chronic microvascular ischemic disease for age. Few small remote cerebellar infarcts noted bilaterally. No acute intracranial hemorrhage. No acute large vessel territory infarct. No mass lesion, midline shift or mass effect. No hydrocephalus or extra-axial fluid collection. Vascular: No abnormal hyperdense  vessel. Calcified atherosclerosis present at the skull base. Skull: Scalp soft tissues demonstrate no acute abnormality. Calvarium intact. Sinuses/Orbits: Globes and orbital soft tissues within normal limits. 2.2 cm ovoid expansile lesion seen adjacent to the left middle turbinate, which could reflect a mucocele or possibly polyp (series 3, image 26). Paranasal sinuses are otherwise largely clear. Trace chronic appearing right mastoid effusion noted. Other: None. IMPRESSION: 1. No acute intracranial abnormality. 2. Mild chronic microvascular ischemic disease with a few small remote cerebellar infarcts. 3. 2.2 cm ovoid expansile lesion adjacent to the left middle turbinate, which could reflect a mucocele or possibly polyp. Correlation with physical exam recommended. Electronically Signed   By: Morene Hoard M.D.   On: 04/22/2024 19:38   CT CHEST WO CONTRAST Result Date: 04/22/2024 CLINICAL DATA:  Persistent hypoxia and dyspnea. EXAM: CT CHEST WITHOUT CONTRAST TECHNIQUE: Multidetector CT imaging of the chest was performed following the standard protocol without IV contrast. RADIATION DOSE REDUCTION: This exam was performed according to the departmental dose-optimization program which includes automated exposure control, adjustment of the mA and/or kV according to patient size and/or use of iterative reconstruction technique. COMPARISON:  Chest radiographs dated 04/19/2024 and 04/16/2024. FINDINGS: Cardiovascular: Atheromatous calcifications, including the coronary arteries and aorta. Enlarged heart. No pericardial effusion. Post CABG changes. Left subclavian pacer and AICD leads. Mediastinum/Nodes: Small amount of high density material in the distal esophagus. Unremarkable trachea and thyroid gland. No enlarged lymph nodes. Lungs/Pleura: Small right pleural effusion. Areas of right lower lobe consolidation and volume loss. Mild patchy interstitial prominence in the superior segment right lower lobe,  posterior right upper lobe and in the left lower lobe. There is also some patchy density in the lingula. Upper Abdomen: Elevated right hemidiaphragm. Air in the intrahepatic biliary tree. Atheromatous calcifications. Scattered colonic diverticula without evidence of diverticulitis. Musculoskeletal: Healing left posterolateral 9th rib fracture. Thoracic and lower cervical spine degenerative changes with ossification of the anterior longitudinal ligament in the mid and lower thoracic spine. IMPRESSION: 1. Areas of right lower lobe consolidation and volume loss, compatible with atelectasis and possible pneumonia. 2. Mild patchy interstitial prominence in the superior segment right lower lobe,  posterior right upper lobe and in the left lower lobe. This could be due to pneumonia or mild pulmonary edema. 3. Patchy density in the lingula suspicious for pneumonia. 4. Small right pleural effusion. 5. Cardiomegaly. 6. Healing left posterolateral 9th rib fracture. 7. Calcific coronary artery and aortic atherosclerosis. 8. Air in the intrahepatic biliary tree, most likely due to previous sphincterotomy. 9. Colonic diverticulosis. Aortic Atherosclerosis (ICD10-I70.0). Electronically Signed   By: Elspeth Bathe M.D.   On: 04/22/2024 15:50   DG CHEST PORT 1 VIEW Result Date: 04/19/2024 CLINICAL DATA:  [id number] Acute respiratory failure with hypoxia [facility]) [id number] Pulmonary edema [id number] EXAM: PORTABLE CHEST - 1 VIEW COMPARISON:  April 16, 2024, March 29, 2024 FINDINGS: Elevation of the right hemidiaphragm with lower lung volumes on the right. The most inferior aspect of the left lung base are excluded from the field of view. Retrocardiac airspace opacities noted. No pneumothorax. Moderate cardiomegaly. Sternotomy wires and CABG markers. Left chest pacemaker with partially visualized leads in the region of the right atrium and right ventricle. Aortic atherosclerosis. No acute fracture or destructive lesions. Multilevel thoracic  osteophytosis. IMPRESSION: The most inferior aspect of the left lung base and costophrenic sulcus are excluded from the field of view. Retrocardiac airspace opacities, may reflect atelectasis, bronchopneumonia, or asymmetric pulmonary edema. Electronically Signed   By: Rogelia Myers M.D.   On: 04/19/2024 17:22   DG Chest Portable 1 View Result Date: 04/16/2024 CLINICAL DATA:  Weakness. EXAM: PORTABLE CHEST 1 VIEW COMPARISON:  March 29, 2024. FINDINGS: Stable cardiomegaly. Sternotomy wires are noted. Left-sided defibrillator is unchanged. Elevated right hemidiaphragm is noted. Left lung base is not completely included in field-of-view. Visualized lung parenchyma is unremarkable. Bony thorax is unremarkable. IMPRESSION: Left lung base is not completely included in field-of-view. No definite abnormality seen in the visualized lung parenchyma. Electronically Signed   By: Lynwood Landy Raddle M.D.   On: 04/16/2024 12:06   EP STUDY Result Date: 04/01/2024 See surgical note for result.  ECHOCARDIOGRAM COMPLETE Result Date: 03/30/2024    ECHOCARDIOGRAM REPORT   Patient Name:   Wayne Gordon Date of Exam: 03/30/2024 Medical Rec #:  [id number]      Height:       76.0 in Accession #:    [id number]     Weight:       242.9 lb Date of Birth:  [birth year]       BSA:          2.406 m Patient Age:    89 years       BP:           106/66 mmHg Patient Gender: M              HR:           80 bpm. Exam Location:  Inpatient Procedure: 2D Echo, Cardiac Doppler, Color Doppler and Intracardiac            Opacification Agent (Both Spectral and Color Flow Doppler were            utilized during procedure). Indications:    I50.21 Acute systolic (congestive) heart failure  History:        Patient has prior history of Echocardiogram examinations, most                 recent 10/15/2021. CHF, CAD, Prior CABG and Defibrillator,                 Arrythmias:Atrial Fibrillation; Risk Factors:Hypertension  and                 Diabetes.  Sonographer:     Damien Senior RDCS Referring Phys: [phone] JACOB J STINSON IMPRESSIONS  1. No LV thrombus by Definity . Left ventricular ejection fraction, by estimation, is 20 to 25%. The left ventricle has severely decreased function. The left ventricle demonstrates global hypokinesis. The left ventricular internal cavity size was mildly dilated. There is mild concentric left ventricular hypertrophy. Left ventricular diastolic parameters are indeterminate. There is the interventricular septum is flattened in systole and diastole, consistent with right ventricular pressure and volume overload.  2. Right ventricular systolic function is mildly reduced. The right ventricular size is moderately enlarged. There is normal pulmonary artery systolic pressure. The estimated right ventricular systolic pressure is 32.8 mmHg.  3. Left atrial size was mildly dilated.  4. S/p Sorin Memo 3D ring annuloplasty (size 30mm, Catalog #[id number], serial [id number]). The mitral valve has been repaired/replaced. Mild mitral valve regurgitation. No evidence of mitral stenosis. The mean mitral valve gradient is 3.0 mmHg with average heart rate of 85 bpm. Procedure Date: 10/17/11. Echo findings are consistent with normal structure and function of the mitral valve prosthesis.  5. The tricuspid valve is abnormal. Tricuspid valve regurgitation is moderate.  6. The aortic valve is tricuspid. There is moderate calcification of the aortic valve. There is moderate thickening of the aortic valve. Aortic valve regurgitation is trivial. Aortic valve sclerosis/calcification is present, without any evidence of aortic stenosis.  7. The inferior vena cava is normal in size with <50% respiratory variability, suggesting right atrial pressure of 8 mmHg. FINDINGS  Left Ventricle: No LV thrombus by Definity . Left ventricular ejection fraction, by estimation, is 20 to 25%. The left ventricle has severely decreased function. The left ventricle demonstrates global hypokinesis. Definity   contrast agent was given IV to delineate the left ventricular endocardial borders. Strain was performed and the global longitudinal strain is indeterminate. The left ventricular internal cavity size was mildly dilated. There is mild concentric left ventricular hypertrophy. The interventricular septum is flattened in systole and diastole, consistent with right ventricular pressure and volume overload. Left ventricular diastolic function could not be evaluated due to mitral valve repair. Left ventricular diastolic parameters are  indeterminate. Right Ventricle: The right ventricular size is moderately enlarged. No increase in right ventricular wall thickness. Right ventricular systolic function is mildly reduced. There is normal pulmonary artery systolic pressure. The tricuspid regurgitant velocity is 2.49 m/s, and with an assumed right atrial pressure of 8 mmHg, the estimated right ventricular systolic pressure is 32.8 mmHg. Left Atrium: Left atrial size was mildly dilated. Right Atrium: Right atrial size was normal in size. Pericardium: There is no evidence of pericardial effusion. Mitral Valve: S/p Sorin Memo 3D ring annuloplasty (size 30mm, Catalog #[id number], serial [id number]). The mitral valve has been repaired/replaced. Mild mitral valve regurgitation. There is a 30 mm Mitral Memo ring prosthetic annuloplasty ring present in the mitral position. Echo findings are consistent with normal structure and function of the mitral valve prosthesis. No evidence of mitral valve stenosis. MV peak gradient, 6.6 mmHg. The mean mitral valve gradient is 3.0 mmHg with average heart rate of 85 bpm. Tricuspid Valve: The tricuspid valve is abnormal. Tricuspid valve regurgitation is moderate. Aortic Valve: The aortic valve is tricuspid. There is moderate calcification of the aortic valve. There is moderate thickening of the aortic valve. Aortic valve regurgitation is trivial. Aortic valve sclerosis/calcification is present, without any  evidence of aortic stenosis.  Pulmonic Valve: The pulmonic valve was not well visualized. Pulmonic valve regurgitation is trivial. Aorta: The aortic root and ascending aorta are structurally normal, with no evidence of dilitation. Venous: The inferior vena cava is normal in size with less than 50% respiratory variability, suggesting right atrial pressure of 8 mmHg. IAS/Shunts: No atrial level shunt detected by color flow Doppler. Additional Comments: 3D was performed not requiring image post processing on an independent workstation and was indeterminate. A device lead is visualized in the right atrium and right ventricle.  LEFT VENTRICLE PLAX 2D LVIDd:         5.10 cm LVIDs:         4.80 cm LV PW:         1.20 cm LV IVS:        1.20 cm LVOT diam:     2.00 cm LV SV:         33 LV SV Index:   14 LVOT Area:     3.14 cm  LEFT ATRIUM              Index        RIGHT ATRIUM           Index LA diam:        4.60 cm  1.91 cm/m   RA Area:     18.60 cm LA Vol (A2C):   107.0 ml 44.47 ml/m  RA Volume:   51.50 ml  21.40 ml/m LA Vol (A4C):   79.8 ml  33.17 ml/m LA Biplane Vol: 93.3 ml  38.78 ml/m  AORTIC VALVE LVOT Vmax:   54.30 cm/s LVOT Vmean:  37.400 cm/s LVOT VTI:    0.104 m  AORTA Ao Root diam: 3.30 cm Ao Asc diam:  3.70 cm MITRAL VALVE             TRICUSPID VALVE MV Area VTI:  1.34 cm   TR Peak grad:   24.8 mmHg MV Peak grad: 6.6 mmHg   TR Vmax:        249.00 cm/s MV Mean grad: 3.0 mmHg MV Vmax:      1.29 m/s   SHUNTS MV Vmean:     80.7 cm/s  Systemic VTI:  0.10 m                          Systemic Diam: 2.00 cm Vishnu Priya Mallipeddi Electronically signed by Diannah Late Mallipeddi Signature Date/Time: 03/30/2024/11:31:43 AM    Final    DG Chest Portable 1 View Result Date: 03/29/2024 CLINICAL DATA:  Weakness.  Dizziness. EXAM: PORTABLE CHEST 1 VIEW COMPARISON:  10/15/2021. FINDINGS: Low lung volume. Mild diffuse pulmonary vascular congestion, likely accentuated by low lung volume. No frank pulmonary edema. There are  probable atelectatic changes at the lung bases. There is subtle blunting of bilateral lateral costophrenic angles, which may be due to trace pleural effusions versus pleural thickening. No pneumothorax on either side. Stable cardio-mediastinal silhouette. There are surgical staples along the heart border and sternotomy wires, status post CABG (coronary artery bypass graft). There is a left sided 3-lead pacemaker. Neurostimulator device noted overlying the lower thoracic spine region. No acute osseous abnormalities. The soft tissues are within normal limits. IMPRESSION: Mild diffuse pulmonary vascular congestion, likely accentuated by low lung volume. No frank pulmonary edema. Electronically Signed   By: Ree Molt M.D.   On: 03/29/2024 12:09    Alm Schneider, DO  [facility]  If 7PM-7AM,  please contact night-coverage [email or web address] Password [email or web address] 04/23/2024, 6:05 PM   LOS: 7 days

## 2024-04-23 NOTE — Progress Notes (Signed)
 Rounding Note   Patient Name: Wayne Gordon Date of Encounter: 04/23/2024  [facility] Cardiologist: Vishnu P Mallipeddi, MD   Subjective Some cough, SOB this morning.   Scheduled Meds:  apixaban   2.5 mg Oral BID   arformoterol   15 mcg Nebulization BID   budesonide  (PULMICORT ) nebulizer solution  0.5 mg Nebulization BID   carvedilol   12.5 mg Oral BID WC   dapagliflozin  propanediol  10 mg Oral QHS   doxycycline   100 mg Oral Q12H   insulin  aspart  0-9 Units Subcutaneous TID WC   insulin  glargine-yfgn  8 Units Subcutaneous QHS   pantoprazole   40 mg Oral QAC breakfast   polyethylene glycol  17 g Oral Daily   senna  2 tablet Oral Daily   sodium chloride  flush  3 mL Intravenous Q12H   Continuous Infusions:  ceFEPime  (MAXIPIME ) IV 2 g (04/22/24 1847)   PRN Meds: acetaminophen , guaiFENesin , ondansetron  (ZOFRAN ) IV, sodium chloride  flush   Vital Signs  Vitals:   04/23/24 0442 04/23/24 0448 04/23/24 0800 04/23/24 0834  BP: 106/60   (!) 95/55  Pulse: 80   88  Resp: (!) 21   19  Temp: 98.1 F (36.7 C)   97.9 F (36.6 C)  TempSrc: Oral   Oral  SpO2: 92%  93% 93%  Weight:  111.9 kg    Height:        Intake/Output Summary (Last 24 hours) at 04/23/2024 0859 Last data filed at 04/23/2024 0449 Gross per 24 hour  Intake 6 ml  Output 1100 ml  Net -1094 ml      04/23/2024    4:48 AM 04/21/2024    5:00 AM 04/20/2024    4:35 AM  Last 3 Weights  Weight (lbs) 246 lb 11.1 oz 251 lb 8.7 oz 247 lb 9.2 oz  Weight (kg) 111.9 kg 114.1 kg 112.3 kg      Telemetry AV paced - Personally Reviewed  ECG  N/a - Personally Reviewed  Physical Exam  GEN: No acute distress.   Neck: No JVD Cardiac: RRR, no murmurs, rubs, or gallops.  Respiratory: coarse bilaterally GI: Soft, nontender, non-distended  MS: 1+ bilateral LE edema Neuro:  Nonfocal  Psych: Normal affect   Labs High Sensitivity Troponin:   Recent Labs  Lab 03/29/24 1120 03/29/24 1302 04/16/24 1029  04/16/24 1155  TROPONINIHS 19* 19* 25* 25*     Chemistry Recent Labs  Lab 04/16/24 1029 04/17/24 0358 04/19/24 0407 04/20/24 0341 04/21/24 0322 04/22/24 0438 04/22/24 0954 04/22/24 1400 04/23/24 0418  NA 136   < > 134*   < > 136 135  --   --  137  K 3.4*   < > 4.2   < > 4.8 5.4*  --  4.8 4.6  CL 99   < > 98   < > 101 101  --   --  100  CO2 26   < > 24   < > 26 25  --   --  26  GLUCOSE 130*   < > 162*   < > 165* 155*  --   --  124*  BUN 36*   < > 41*   < > 49* 58*  --   --  64*  CREATININE 2.75*   < > 2.62*   < > 2.65* 2.96*  --   --  2.91*  CALCIUM  8.2*   < > 8.5*   < > 8.3* 8.3*  --   --  8.3*  MG  --    < > 2.3  --  2.5*  --   --   --  2.5*  PROT 6.1*  --   --   --   --   --  6.2*  --   --   ALBUMIN  3.2*  --   --   --   --   --  3.0*  --   --   AST 25  --   --   --   --   --  55*  --   --   ALT 20  --   --   --   --   --  44  --   --   ALKPHOS 75  --   --   --   --   --  94  --   --   BILITOT 1.2  --   --   --   --   --  2.2*  --   --   GFRNONAA 22*   < > 23*   < > 22* 20*  --   --  20*  ANIONGAP 11   < > 12   < > 9 9  --   --  11   < > = values in this interval not displayed.    Lipids No results for input(s): CHOL, TRIG, HDL, LABVLDL, LDLCALC, CHOLHDL in the last 168 hours.  Hematology Recent Labs  Lab 04/21/24 0322 04/22/24 0954 04/23/24 0418  WBC 11.8* 13.3* 11.3*  RBC 4.35 4.49 4.17*  HGB 12.7* 13.2 12.0*  HCT 40.5 41.8 38.9*  MCV 93.1 93.1 93.3  MCH 29.2 29.4 28.8  MCHC 31.4 31.6 30.8  RDW 15.3 15.4 15.4  PLT 145* 169 178   Thyroid  Recent Labs  Lab 04/18/24 0410  TSH 4.368  FREET4 2.02*    BNP Recent Labs  Lab 04/16/24 1029 04/17/24 0358  BNP 681.0* 927.0*    DDimer No results for input(s): DDIMER in the last 168 hours.   Radiology  CT HEAD WO CONTRAST ( ) Result Date: 04/22/2024 CLINICAL DATA:  Initial evaluation for mental status change, unknown cause. EXAM: CT HEAD WITHOUT CONTRAST TECHNIQUE: Contiguous axial images  were obtained from the base of the skull through the vertex without intravenous contrast. RADIATION DOSE REDUCTION: This exam was performed according to the departmental dose-optimization program which includes automated exposure control, adjustment of the mA and/or kV according to patient size and/or use of iterative reconstruction technique. COMPARISON:  CT from 07/10/2015. FINDINGS: Brain: Cerebral volume within normal limits. Mild chronic microvascular ischemic disease for age. Few small remote cerebellar infarcts noted bilaterally. No acute intracranial hemorrhage. No acute large vessel territory infarct. No mass lesion, midline shift or mass effect. No hydrocephalus or extra-axial fluid collection. Vascular: No abnormal hyperdense vessel. Calcified atherosclerosis present at the skull base. Skull: Scalp soft tissues demonstrate no acute abnormality. Calvarium intact. Sinuses/Orbits: Globes and orbital soft tissues within normal limits. 2.2 cm ovoid expansile lesion seen adjacent to the left middle turbinate, which could reflect a mucocele or possibly polyp (series 3, image 26). Paranasal sinuses are otherwise largely clear. Trace chronic appearing right mastoid effusion noted. Other: None. IMPRESSION: 1. No acute intracranial abnormality. 2. Mild chronic microvascular ischemic disease with a few small remote cerebellar infarcts. 3. 2.2 cm ovoid expansile lesion adjacent to the left middle turbinate, which could reflect a mucocele or possibly polyp. Correlation with physical exam recommended. Electronically Signed   By:  Morene Hoard M.D.   On: 04/22/2024 19:38   CT CHEST WO CONTRAST Result Date: 04/22/2024 CLINICAL DATA:  Persistent hypoxia and dyspnea. EXAM: CT CHEST WITHOUT CONTRAST TECHNIQUE: Multidetector CT imaging of the chest was performed following the standard protocol without IV contrast. RADIATION DOSE REDUCTION: This exam was performed according to the departmental dose-optimization  program which includes automated exposure control, adjustment of the mA and/or kV according to patient size and/or use of iterative reconstruction technique. COMPARISON:  Chest radiographs dated 04/19/2024 and 04/16/2024. FINDINGS: Cardiovascular: Atheromatous calcifications, including the coronary arteries and aorta. Enlarged heart. No pericardial effusion. Post CABG changes. Left subclavian pacer and AICD leads. Mediastinum/Nodes: Small amount of high density material in the distal esophagus. Unremarkable trachea and thyroid gland. No enlarged lymph nodes. Lungs/Pleura: Small right pleural effusion. Areas of right lower lobe consolidation and volume loss. Mild patchy interstitial prominence in the superior segment right lower lobe, posterior right upper lobe and in the left lower lobe. There is also some patchy density in the lingula. Upper Abdomen: Elevated right hemidiaphragm. Air in the intrahepatic biliary tree. Atheromatous calcifications. Scattered colonic diverticula without evidence of diverticulitis. Musculoskeletal: Healing left posterolateral 9th rib fracture. Thoracic and lower cervical spine degenerative changes with ossification of the anterior longitudinal ligament in the mid and lower thoracic spine. IMPRESSION: 1. Areas of right lower lobe consolidation and volume loss, compatible with atelectasis and possible pneumonia. 2. Mild patchy interstitial prominence in the superior segment right lower lobe, posterior right upper lobe and in the left lower lobe. This could be due to pneumonia or mild pulmonary edema. 3. Patchy density in the lingula suspicious for pneumonia. 4. Small right pleural effusion. 5. Cardiomegaly. 6. Healing left posterolateral 9th rib fracture. 7. Calcific coronary artery and aortic atherosclerosis. 8. Air in the intrahepatic biliary tree, most likely due to previous sphincterotomy. 9. Colonic diverticulosis. Aortic Atherosclerosis (ICD10-I70.0). Electronically Signed   By:  Elspeth Bathe M.D.   On: 04/22/2024 15:50    Cardiac Studies   Patient Profile   89 y.o. male  with a hx of CAD s/p CABG x 3 with MVR 2013, ICM with baseline EF 20-25%, VT, sinus node dysfunction s/p ICD 2013 with upgrade of Biotronik BiV ICD 09/2020, persistent atrial fibrillation/flutter s/p successful DCCV 04/01/2024, HTN, HLD, DM II, stage III CKD, Carotid Artery disease,  vertigo who is being seen 04/17/2024 for the evaluation of acute HF at the request of Dr. Evonnie.   Assessment & Plan   1.Acute on chronic combined systolic/diastolic HF - 09/2021 echo: LVE 25% - 03/2024 echo: LVEF 20-25%, indet diastolic function, D shaped septum, mild RV dysfunction - presented with 13 lbs weight gain - CXR no acute process. BNP 927   - had been on IV lasix  80mg  tid, transitioned  to oral torsemide  40mg . Uptrend in Cr diuretics have been held. Cr remains elevated, continue to hold diuretics today.  - reds vest has been low quality -Weights appear inaccurate.  - medical therapy with coreg  12.5mg  bid, farxiga  10mg . In general medical therapy limited by renal dysfunction, soft bp's at times. - check standing weight when able, patient lethargic this AM hold for today.  - CT chest yesterday with areas of RLL consolidation, possible pneumonia. Mild patch prominence pneumonia vs pulm edema, patchy density lingula concerning for pneumonia. Procalc elevated this admission   - essentially looks to be fairly well diursed, lingering SOB looks to be more related to pneumonia. Restart diuretic pending renal function over next few days.  2. CAD with prior CABG - no acute issues this admission.      3. Persistent afib - prior DCCV 03/2024 - on coreg , eliquis .    4. AMS - per primary team - likely secondary to pneumonia, primary team has started antibiotics.    5. Pneumonia - - CT chest yesterday with areas of RLL consolidation, possible pneumonia. Mild patch prominence pneumonia vs pulm edema, patchy  density lingula concerning for pneumonia. Procalc elevated this admission  - abx per primary team  For questions or updates, please contact [facility] Please consult [email or web address] for contact info under     Signed, Alvan Carrier, MD  04/23/2024, 8:59 AM

## 2024-04-23 NOTE — Progress Notes (Addendum)
 Nurse at bedside,patient alert to person,place,and situation was able to tell me the month and year.Blood pressure 95/55,heart rate 88,Dr Tat notified,holding the coreg  this am.Patient was able to take medications whole and one at a time this am with no issues. No c/o pain or discomfort noted .Family at bedside.Plan of care on going.

## 2024-04-23 NOTE — Plan of Care (Signed)

## 2024-04-23 NOTE — Evaluation (Signed)
 Clinical/Bedside Swallow Evaluation Patient Details  Name: Wayne Gordon MRN: [id number] Date of Birth: October 06, 1933  Today's Date: 04/23/2024 Time: SLP Start Time (ACUTE ONLY): 1201 SLP Stop Time (ACUTE ONLY): 1230 SLP Time Calculation (min) (ACUTE ONLY): 29 min  Past Medical History:  Past Medical History:  Diagnosis Date   Allergy to ACE inhibitors    Angioedema many years ago; patient has tolerated Losartan  (ARB) in the past - it was held during admisson for GI bleed and worsening renal function >> resume Losartan  25 mg QD 06/2015   Arthritis    Atrial fibrillation (HCC)    PAF, CHADs2Vasc = 5   Carotid artery disease (HCC)    60-79% LICA   CHF (congestive heart failure) (HCC)    CKD (chronic kidney disease)    Coronary artery disease    Diabetes mellitus    GERD (gastroesophageal reflux disease)    Hyperlipidemia    Hypertension    ICD (implantable cardiac defibrillator) in place 03/28/2012   Biotronik, Dr. Waddell 03/28/12   Ischemic cardiomyopathy    S/P CABG x 3; EF 25%   Mitral regurgitation    S/P mitral valve repair 2013   Myocardial infarction [facility])    Renal insufficiency    Past Surgical History:  Past Surgical History:  Procedure Laterality Date   BIV UPGRADE N/A 10/12/2020   Procedure: BIV ICD UPGRADE;  Surgeon: Waddell Danelle ORN, MD;  Location: [facility] INVASIVE CV LAB;  Service: Cardiovascular;  Laterality: N/A;   CARDIOVERSION N/A 04/01/2024   Procedure: CARDIOVERSION;  Surgeon: Lonni Slain, MD;  Location: [facility] INVASIVE CV LAB;  Service: Cardiovascular;  Laterality: N/A;   CHOLECYSTECTOMY N/A 09/20/2017   Procedure: LAPAROSCOPIC SUBTOTAL CHOLECYSTECTOMY;  Surgeon: Rubin Calamity, MD;  Location: [facility] OR;  Service: General;  Laterality: N/A;   COLONOSCOPY N/A 04/24/2014   RMR Pancolonic diverticulosis   CORONARY ARTERY BYPASS GRAFT  10/17/2011   Procedure: CORONARY ARTERY BYPASS GRAFTING (CABG);  Surgeon: Sudie VEAR Laine, MD;  Location: [facility] OR;  Service: Open Heart  Surgery;  Laterality: N/A;   ERCP N/A 09/29/2017   Procedure: ENDOSCOPIC RETROGRADE CHOLANGIOPANCREATOGRAPHY (ERCP);  Surgeon: Rollin Dover, MD;  Location: [facility] ENDOSCOPY;  Service: Endoscopy;  Laterality: N/A;   ESOPHAGOGASTRODUODENOSCOPY N/A 04/24/2014   RMR Subtle nodularity the gastric mucosa of uncertain significance-status post biopsy. Hiatal hernia. chronic inflammation, no H.pylori   ESOPHAGOGASTRODUODENOSCOPY N/A 06/24/2016   Dr. Shaaron: normal esophagus, small hiatal hernia, normal duodenum   ICD  03/28/2012   IMPLANTABLE CARDIOVERTER DEFIBRILLATOR IMPLANT N/A 03/28/2012   Procedure: IMPLANTABLE CARDIOVERTER DEFIBRILLATOR IMPLANT;  Surgeon: Danelle ORN Waddell, MD;  Location: [facility] CATH LAB;  Service: Cardiovascular;  Laterality: N/A;   KNEE ARTHROSCOPY  ~ 2008   right   LEFT AND RIGHT HEART CATHETERIZATION WITH CORONARY ANGIOGRAM N/A 10/12/2011   Procedure: LEFT AND RIGHT HEART CATHETERIZATION WITH CORONARY ANGIOGRAM;  Surgeon: Deatrice DELENA Cage, MD;  Location: [facility] CATH LAB;  Service: Cardiovascular;  Laterality: N/A;   MITRAL VALVE REPAIR  10/17/2011   Procedure: MITRAL VALVE REPAIR (MVR);  Surgeon: Sudie VEAR Laine, MD;  Location: [facility] OR;  Service: Open Heart Surgery;  Laterality: N/A;   NASAL SINUS SURGERY  1990's   right   HPI:  Wayne Gordon is a 89 year old male with HFrEF, ischemic cardiomyopathy, LVEF 20-25%, status post ICD, pacer placement, stage IV CKD, atrial fibrillation/atrial flutter on chronic anticoagulation with apixaban , vertigo, osteoarthritis, type 2 diabetes mellitus on insulin , with renal complications, hyperlipidemia, hypertension, coronary artery disease, status post mitral  valve repair, recent hospitalizations, recently discharged 04/01/2024 with a discharge weight of 234 pounds at that time.  EP had performed a cardioversion on 8/4 for symptomatic atrial flutter.  Since hospital discharge, his weight has steadily climbed despite taking oral diuretics at home.  He is now up to 247  pounds.  He is increasingly becoming weaker.  He saw cardiology in the outpatient setting recently and was taken off amiodarone .  He was brought into the emergency department because he continued to have swelling and weight gain in his legs.  He complained of severe fatigue and weakness and was unable to get out of bed this morning because he was so weak.  He was advised by the cardiology office to call EMS and come to the ED.  He was noted to be severely deconditioned and frail with significant peripheral edema, elevated BNP of 681, troponin 25, potassium 3.4, creatinine 2.75 and request was made for inpatient hospitalization for IV diuresis as he is failing outpatient diuresis with oral medication likely due to advanced renal failure. CT chest on 8/25 indicated potential RLL PNA.  ST consulted for clinical swallow evaluation.    Assessment / Plan / Recommendation  Clinical Impression  Pt seen for clinical swallow evaluation with limited dentition (missing/upper dentures) with impaired mastication noted with solids, but pt consumed all consistencies (thin, puree and solids) with mod I slow rate, small bites and sips and education provided for respiratory/swallowing reciprocity d/t recent PNA dx/deconditioning.  Timely swallow and oral clearance appeared adequate.  No overt s/s of aspiration observed during observation with lunch tray.  New oxygen  requirement (2.5L) and generalized oral weakness noted during OME, but otherwise, pt reported no prior dysphagia.  ST will f/u briefly in acute setting for diet tolerance/dysphagia tx/education and potential need for objective assessment given RLL PNA dx/deconditioning.  Continue current diet with pt preferred regular/thin liquid consistency.  Thank you for this consult. SLP Visit Diagnosis: Dysphagia, unspecified (R13.10)    Aspiration Risk  Mild aspiration risk    Diet Recommendation   Thin;Age appropriate regular;Other (Comment) (pt preferred)  Medication  Administration: Other (Comment) (Whole with puree and/or liquid pending pt preference)    Other  Recommendations Oral Care Recommendations: Oral care BID     Assistance Recommended at Discharge  TBD  Functional Status Assessment Patient has had a recent decline in their functional status and demonstrates the ability to make significant improvements in function in a reasonable and predictable amount of time.  Frequency and Duration min 1 x/week  1 week       Prognosis Prognosis for improved oropharyngeal function: Good      Swallow Study   General Date of Onset: 04/16/24 HPI: Wayne Gordon is a 89 year old male with HFrEF, ischemic cardiomyopathy, LVEF 20-25%, status post ICD, pacer placement, stage IV CKD, atrial fibrillation/atrial flutter on chronic anticoagulation with apixaban , vertigo, osteoarthritis, type 2 diabetes mellitus on insulin , with renal complications, hyperlipidemia, hypertension, coronary artery disease, status post mitral valve repair, recent hospitalizations, recently discharged 04/01/2024 with a discharge weight of 234 pounds at that time.  EP had performed a cardioversion on 8/4 for symptomatic atrial flutter.  Since hospital discharge, his weight has steadily climbed despite taking oral diuretics at home.  He is now up to 247 pounds.  He is increasingly becoming weaker.  He saw cardiology in the outpatient setting recently and was taken off amiodarone .  He was brought into the emergency department because he continued to have swelling and weight  gain in his legs.  He complained of severe fatigue and weakness and was unable to get out of bed this morning because he was so weak.  He was advised by the cardiology office to call EMS and come to the ED.  He was noted to be severely deconditioned and frail with significant peripheral edema, elevated BNP of 681, troponin 25, potassium 3.4, creatinine 2.75 and request was made for inpatient hospitalization for IV diuresis as he is  failing outpatient diuresis with oral medication likely due to advanced renal failure. CT chest on 8/25 indicated potential RLL PNA.  ST consulted for clinical swallow evaluation. Type of Study: Bedside Swallow Evaluation Previous Swallow Assessment: n/a Diet Prior to this Study: Regular;Thin liquids (Level 0) (2 gm sodium) Temperature Spikes Noted: No Respiratory Status: Nasal cannula (2.5L) History of Recent Intubation: No Behavior/Cognition: Alert;Cooperative Oral Cavity Assessment: Within Functional Limits Oral Care Completed by SLP: Other (Comment) (pt consuming lunch tray when SLP arrived) Oral Cavity - Dentition: Dentures, top;Missing dentition Vision: Functional for self-feeding Self-Feeding Abilities: Able to feed self Patient Positioning: Upright in chair Baseline Vocal Quality: Low vocal intensity;Hoarse Volitional Cough: Weak Volitional Swallow: Able to elicit    Oral/Motor/Sensory Function Overall Oral Motor/Sensory Function: Generalized oral weakness   Ice Chips Ice chips: Not tested   Thin Liquid Thin Liquid: Within functional limits Presentation: Cup;Self Fed Other Comments: encouraged to utilize small sips/slow rate and take respiratory breaks prn    Nectar Thick Nectar Thick Liquid: Not tested   Honey Thick Honey Thick Liquid: Not tested   Puree Puree: Within functional limits Presentation: Self Fed   Solid     Solid: Impaired Presentation: Self Fed Oral Phase Impairments: Impaired mastication Oral Phase Functional Implications: Impaired mastication;Prolonged oral transit      Wayne Gordon,M.S.,CCC-SLP 04/23/2024,2:13 PM

## 2024-04-23 NOTE — Progress Notes (Signed)
 Nurse at bedside,blood pressure 99/49,heart rate 78,Dr Tat notified,holding the coreg . Plan of care on going.

## 2024-04-23 NOTE — Progress Notes (Signed)
 Occupational Therapy Treatment Patient Details Name: Wayne Gordon MRN: [id number] DOB: 01-12-1934 Today's Date: 04/23/2024   History of present illness Wayne Gordon is a 89 year old male with HFrEF, ischemic cardiomyopathy, LVEF 20-25%, status post ICD, pacer placement, stage IV CKD, atrial fibrillation/atrial flutter on chronic anticoagulation with apixaban , vertigo, osteoarthritis, type 2 diabetes mellitus on insulin , with renal complications, hyperlipidemia, hypertension, coronary artery disease, status post mitral valve repair, recent hospitalizations, recently discharged 04/01/2024 with a discharge weight of 234 pounds at that time.  EP had performed a cardioversion on 8/4 for symptomatic atrial flutter.  Since hospital discharge, his weight has steadily climbed despite taking oral diuretics at home.  He is now up to 247 pounds.  He is increasingly becoming weaker.  He saw cardiology in the outpatient setting recently and was taken off amiodarone .  He was brought into the emergency department because he continued to have swelling and weight gain in his legs.  He complained of severe fatigue and weakness and was unable to get out of bed this morning because he was so weak.  He was advised by the cardiology office to call EMS and come to the ED.  He was noted to be severely deconditioned and frail with significant peripheral edema, elevated BNP of 681, troponin 25, potassium 3.4, creatinine 2.75 and request was made for inpatient hospitalization for IV diuresis as he is failing outpatient diuresis with oral medication likely due to advanced renal failure.   OT comments  Pt agreeable to OT treatment. PT entered the room during the session and were assisted in functional ambulation work. Pt was able to complete brief B UE strengthening seated at EOB after mod A to transfer to the chair with RW. Pt required max/total assist for peri-care today to clean after having bowel on his bed pad. Pt able to  functionally ambulate with this OT following with a transport chair. Two rest breaks needed during short distance ambulation in the hall. Pt left in the chair with call bell within reach and family present. Pt will benefit from continued OT in the hospital and recommended venue below to increase strength, balance, and endurance for safe ADL's.         If plan is discharge home, recommend the following:  A lot of help with walking and/or transfers;Assistance with cooking/housework;Assist for transportation;Help with stairs or ramp for entrance;A little help with bathing/dressing/bathroom   Equipment Recommendations  None recommended by OT          Precautions / Restrictions Precautions Precautions: Fall Recall of Precautions/Restrictions: Intact Restrictions Weight Bearing Restrictions Per Provider Order: No       Mobility Bed Mobility Overal bed mobility: Needs Assistance Bed Mobility: Supine to Sit     Supine to sit: HOB elevated, Contact guard     General bed mobility comments: slow labored movement    Transfers Overall transfer level: Needs assistance Equipment used: Rolling walker (2 wheels) Transfers: Sit to/from Stand, Bed to chair/wheelchair/BSC Sit to Stand: Mod assist     Step pivot transfers: Mod assist     General transfer comment: unsteady labored movement; EOB to chair     Balance Overall balance assessment: Needs assistance Sitting-balance support: Feet supported, No upper extremity supported Sitting balance-Leahy Scale: Fair Sitting balance - Comments: seated EOB   Standing balance support: During functional activity, Reliant on assistive device for balance, Bilateral upper extremity supported Standing balance-Leahy Scale: Fair Standing balance comment: fair/poor using RW  ADL either performed or assessed with clinical judgement   ADL Overall ADL's : Needs assistance/impaired     Grooming: Set  up;Sitting Grooming Details (indicate cue type and reason): Pt's granddaughter assisted pt to wash his face by giving him a wet washcloth as this therapist entered the session.                     Toileting- Clothing Manipulation and Hygiene: Maximal assistance;Total assistance;Sit to/from stand Toileting - Clothing Manipulation Details (indicate cue type and reason): Pt assisted to wipe after having bowel on the bed pad. Pt stood with PT using RW while this OT assisted pt with peri-care.     Functional mobility during ADLs: Rolling walker (2 wheels);Moderate assistance General ADL Comments: Pt able to ambulate two reps of ~5 to 10 feet with rest inbetween and use of RW.    Extremity/Trunk Assessment Upper Extremity Assessment Upper Extremity Assessment: Overall WFL for tasks assessed   Lower Extremity Assessment Lower Extremity Assessment: Defer to PT evaluation        Vision   Vision Assessment?: No apparent visual deficits   Perception Perception Perception: Not tested   Praxis Praxis Praxis: Not tested   Communication Communication Communication: No apparent difficulties   Cognition Arousal: Alert Behavior During Therapy: WFL for tasks assessed/performed Cognition: No apparent impairments                               Following commands: Intact        Cueing   Cueing Techniques: Verbal cues, Visual cues, Tactile cues  Exercises General Exercises - Upper Extremity Shoulder Flexion: AROM, Both, 10 reps, Seated Shoulder ABduction: AROM, 10 reps, Seated, Both                 Pertinent Vitals/ Pain       Pain Assessment Pain Assessment: No/denies pain                                                          Frequency  Min 3X/week        Progress Toward Goals  OT Goals(current goals can now be found in the care plan section)  Progress towards OT goals: Progressing toward goals  Acute Rehab OT  Goals Patient Stated Goal: improve function OT Goal Formulation: With patient Time For Goal Achievement: 05/01/24 Potential to Achieve Goals: Good ADL Goals Pt Will Perform Grooming: with contact guard assist;standing Pt Will Perform Lower Body Bathing: with modified independence;sitting/lateral leans Pt Will Perform Upper Body Dressing: with modified independence;sitting Pt Will Perform Lower Body Dressing: with modified independence;sitting/lateral leans Pt Will Transfer to Toilet: with modified independence;ambulating Pt Will Perform Toileting - Clothing Manipulation and hygiene: with modified independence;sitting/lateral leans Pt/caregiver will Perform Home Exercise Program: Increased strength;Increased ROM;Both right and left upper extremity;Independently  Plan      Co-evaluation    PT/OT/SLP Co-Evaluation/Treatment: Yes Reason for Co-Treatment: To address functional/ADL transfers PT goals addressed during session: Mobility/safety with mobility;Balance;Proper use of DME OT goals addressed during session: ADL's and self-care                          End of Session Equipment Utilized During Treatment: Gait belt;Rolling  walker (2 wheels)  OT Visit Diagnosis: Unsteadiness on feet (R26.81);Other abnormalities of gait and mobility (R26.89);Muscle weakness (generalized) (M62.81);Adult, failure to thrive (R62.7)   Activity Tolerance Patient tolerated treatment well   Patient Left in chair;with call bell/phone within reach;with family/visitor present   Nurse Communication          Time: 9086-9054 OT Time Calculation (min): 32 min  Charges: OT General Charges $OT Visit: 1 Visit OT Treatments $Therapeutic Activity: 8-22 mins  Erielle Gawronski OT, MOT  Jayson Person 04/23/2024, 11:38 AM

## 2024-04-23 NOTE — Progress Notes (Addendum)
 Daily Progress Note   Patient Name: Wayne Gordon       Date: 04/23/2024 DOB: 15-Mar-1934  Age: 89 y.o. MRN#: [id number] Attending Physician: Evonnie Lenis, MD Primary Care Physician: Renato Dorothey HERO, NP Admit Date: 04/16/2024  Reason for Consultation/Follow-up: Establishing goals of care  Subjective:   89 y.o. male  with past medical history of HFrEF, CAD status post CABG and mitral valve repair 2013, LVEF 20-25%, status post ICD, pacer placement, stage IV CKD, persistent atrial fibrillation/atrial flutter on chronic anticoagulation with apixaban  status post DCCV 84/25, type 2 diabetes mellitus on insulin , with renal complications, hyperlipidemia, hypertension, coronary artery disease, status post mitral valve repair  admitted on 04/16/2024 with weight gain, increased weakness, and shortness of breath despite being compliant with home oral diuretics.    Patient was admitted for acute on chronic HFrEF.  Treated with IV diuresis.  Cardiology is following.  Hospital course was complicated by varying levels of alertness and now with new oxygen  requirement. Undergoing further workup. Cardiology has been following him as an outpatient and making adjustments to his medications to address symptoms as well. He is also followed by endocrinology for his T2DM. Underwent cardioversion 04/01/2024 and converted to NSR.     04/22/2024: Initial goals of care and advanced directive discussion completed.  Goals are to maintain function and have good quality of life.  Patient's CODE STATUS was updated to DNR/DNI, continue to treat the treatable and pursue further workup in hopes of having him discharged to SNF for rehab and ultimately return home with family.  Today, labs and imaging independently reviewed.  CT chest from 8/25 reveals consolidation in the right lower  lobe, interstitial prominence bilaterally, patchy density in lingula, and enlarged heart.  Findings concerning for pneumonia.  This is further supported by elevated white blood cell count on 04/22/2024 of 13.3.  Patient was started on antibiotics by attending team.  Improved today to 11.3.  Hemoglobin 12 g/dL which is consistent with prior levels.  Hemoglobin range appears to be 12-13.  Renal function slightly improving.  Continues to have mild hypocalcemia in the 8 range.  CT head reviewed and no acute abnormalities or significant atrophy noted.  Vital signs reveal some mild hypotension with SBP in the 90s on most recent check.  Continues to require nasal cannula at 2.5 L to maintain oxygen  saturations.  24-hour medication look back performed.  No symptom appear to have been administered/required.  Most recent pain level per nursing 0 out of 10.  Patient states he is doing better.  He denies any pain, nausea, or vomiting.  He does report  some shortness of breath with exertion but denies any at rest.  He continues to feel somewhat fatigued.  Goals of care and advanced directives discussed with patient.  He states he does not remember much from yesterday and is agreeable to discussing goals of care/advance directives.  His granddaughter Sidra Ball) is also at bedside.  He is agreeable to having her remain in the room for the discussion and participate.  He shares his goal at this time is to improve and to be able to return home with family.  Confirmed that his values include quality of life and being functional.  He states that he and his wife and family have talked about advance directives in the past.  He indicates he would want his daughter Olam Pereyra to make medical decisions for him if he was unable to.  I engaged in discussion of advance directives including the care he is willing to undergo for the possibility of more time in the care he has not.  Discussed the limitations and potential burdens of CPR and  intubation, particularly in the context of advanced age and serious underlying health conditions.  He indicates that he would not want CPR or mechanical ventilation.  He is okay with continuing to treat the treatable but does not want mechanical life support.  MOST form reviewed in detail and completed with patient.  Also provided education on what MOST/golden rod forms are and how to utilize in home setting (ie place on fridge in readily visible place etc). See below for more details.   I completed a MOST form today. The patient and family outlined their wishes for the following treatment decisions:  Cardiopulmonary Resuscitation: Do Not Attempt Resuscitation (DNR/No CPR)  Medical Interventions: Limited Additional Interventions: Use medical treatment, IV fluids and cardiac monitoring as indicated, DO NOT USE intubation or mechanical ventilation. May consider use of less invasive airway support such as BiPAP or CPAP. Also provide comfort measures. Transfer to the hospital if indicated. Avoid intensive care.   Antibiotics: Antibiotics if indicated  IV Fluids: IV fluids if indicated  Feeding Tube: No feeding tube     Chart review/care coordination:  Completed extensive chart review including EPIC notes, labs/diagnostics (independently reviewed), vital signs, MAR. Coordinated care directly with bedside nursing staff, attending physician, TOC, patient, daughter Giles Pereyra) by phone and granddaughter Sidra Ball) at bedside.   Length of Stay: 7   Physical Exam Constitutional:      General: He is not in acute distress.    Appearance: He is not toxic-appearing.  HENT:     Mouth/Throat:     Mouth: Mucous membranes are moist.  Pulmonary:     Effort: Pulmonary effort is normal. No respiratory distress.  Skin:    General: Skin is warm and dry.  Neurological:     Comments: Drowsy but remains awake throughout discussion             Vital Signs: BP (!) 95/55 (BP Location: Left Arm)   Pulse  88   Temp 97.9 F (36.6 C) (Oral)   Resp 19   Ht 6' 6 (1.981 m)   Wt 111.9 kg   SpO2 93%   BMI 28.51 kg/m  SpO2: SpO2: 93 % O2 Device: O2 Device: Nasal Cannula O2 Flow Rate: O2 Flow Rate (L/min): 2.5 L/min      Palliative Assessment/Data: 60%   Palliative Care Assessment & Plan   Patient Profile/Assessment:  89 year old male admitted 04/16/2024 for weight gain, increased weakness, and shortness  of breath diagnosed with acute on chronic HFrEF.  Initially treated with IV diuresis.  He did have a rise in his renal function and therefore IV diuresis has since been placed on hold.  Patient noted to have varying levels of alertness and so further workup was pursued.  Patient diagnosed with pneumonia and started on dual antibiotic therapy.  Palliative team initial consult completed 04/22/2024.  Mostly completed with patient's daughter and wife as he was unable to contribute much to history due to lethargy.  Completed goals of care and advanced directive discussion.  Goal is to go to the [facility] for rehab and get stronger and ultimately return home.  Quality of life is important for the patient.  Patient was transition to DNR/DNI, treat the treatable.  Followed up with patient again on 04/23/2024 and he confirms goals of care and advanced directives.  MOST and goldenrod have been completed.  He appears to have made some improvements.  Palliative team will to continue to follow.   Recommendations/Plan:  DNR/DNI Treat the treatable Outpatient palliative versus hospice follow-up at discharge depending on patient status Symptoms well-controlled at present, continue current symptom regimen Palliative medicine team will continue to follow for ongoing goals of care discussion, symptom management, and coordination of care.   Symptom management:  Symptoms stable at present, therefore continue symptom regimen per admitting team with PMT available as needed for support  Prognosis:  Unable to  determine    Discharge Planning: To Be Determined, likely to SNF for rehab then home    Detailed review of medical records (labs, imaging, vital signs), medically appropriate exam, discussed with treatment team, counseling and education to patient, family, & staff, documenting clinical information, medication management, coordination of care   Total time:  I spent 50 minutes in the care of the patient today in the above activities and documenting the encounter.         Laymon CHRISTELLA Pinal, NP  Palliative Medicine Team Team phone # [phone]  Thank you for allowing the Palliative Medicine Team to assist in the care of this patient. Please utilize secure chat with additional questions, if there is no response within 30 minutes please call the above phone number.  Palliative Medicine Team providers are available by phone from 7am to 7pm daily and can be reached through the team cell phone.  Should this patient require assistance outside of these hours, please call the patient's attending physician.

## 2024-04-23 NOTE — Plan of Care (Signed)
  Problem: Coping: Goal: Ability to adjust to condition or change in health will improve Outcome: Progressing   Problem: Fluid Volume: Goal: Ability to maintain a balanced intake and output will improve Outcome: Progressing   Problem: Health Behavior/Discharge Planning: Goal: Ability to manage health-related needs will improve Outcome: Progressing   Problem: Nutritional: Goal: Maintenance of adequate nutrition will improve Outcome: Progressing   Problem: Skin Integrity: Goal: Risk for impaired skin integrity will decrease Outcome: Progressing   Problem: Education: Goal: Knowledge of General Education information will improve Description: Including pain rating scale, medication(s)/side effects and non-pharmacologic comfort measures Outcome: Progressing   Problem: Health Behavior/Discharge Planning: Goal: Ability to manage health-related needs will improve Outcome: Progressing   Problem: Clinical Measurements: Goal: Will remain free from infection Outcome: Progressing   Problem: Activity: Goal: Risk for activity intolerance will decrease Outcome: Progressing   Problem: Safety: Goal: Ability to remain free from injury will improve Outcome: Progressing

## 2024-04-24 ENCOUNTER — Ambulatory Visit: Attending: Cardiovascular Disease | Admitting: Physician Assistant

## 2024-04-24 DIAGNOSIS — Z7189 Other specified counseling: Secondary | ICD-10-CM | POA: Diagnosis not present

## 2024-04-24 DIAGNOSIS — I4819 Other persistent atrial fibrillation: Secondary | ICD-10-CM | POA: Diagnosis not present

## 2024-04-24 DIAGNOSIS — I2510 Atherosclerotic heart disease of native coronary artery without angina pectoris: Secondary | ICD-10-CM

## 2024-04-24 DIAGNOSIS — Z515 Encounter for palliative care: Secondary | ICD-10-CM | POA: Diagnosis not present

## 2024-04-24 DIAGNOSIS — I5021 Acute systolic (congestive) heart failure: Secondary | ICD-10-CM | POA: Diagnosis not present

## 2024-04-24 DIAGNOSIS — N184 Chronic kidney disease, stage 4 (severe): Secondary | ICD-10-CM | POA: Diagnosis not present

## 2024-04-24 LAB — BASIC METABOLIC PANEL WITH GFR
Anion gap: 10 (ref 5–15)
BUN: 64 mg/dL — ABNORMAL HIGH (ref 8–23)
CO2: 28 mmol/L (ref 22–32)
Calcium: 8.3 mg/dL — ABNORMAL LOW (ref 8.9–10.3)
Chloride: 101 mmol/L (ref 98–111)
Creatinine, Ser: 2.75 mg/dL — ABNORMAL HIGH (ref 0.61–1.24)
GFR, Estimated: 22 mL/min — ABNORMAL LOW (ref >60)
Glucose, Bld: 125 mg/dL — ABNORMAL HIGH (ref 70–99)
Potassium: 4.4 mmol/L (ref 3.5–5.1)
Sodium: 139 mmol/L (ref 135–145)

## 2024-04-24 LAB — GLUCOSE, CAPILLARY
Glucose-Capillary: 112 mg/dL — ABNORMAL HIGH (ref 70–99)
Glucose-Capillary: 109 mg/dL — ABNORMAL HIGH (ref 70–99)
Glucose-Capillary: 154 mg/dL — ABNORMAL HIGH (ref 70–99)
Glucose-Capillary: 163 mg/dL — ABNORMAL HIGH (ref 70–99)
Glucose-Capillary: 184 mg/dL — ABNORMAL HIGH (ref 70–99)

## 2024-04-24 LAB — CBC
HCT: 39.8 % (ref 39.0–52.0)
Hemoglobin: 12.3 g/dL — ABNORMAL LOW (ref 13.0–17.0)
MCH: 28.9 pg (ref 26.0–34.0)
MCHC: 30.9 g/dL (ref 30.0–36.0)
MCV: 93.6 fL (ref 80.0–100.0)
Platelets: 230 K/uL (ref 150–400)
RBC: 4.25 MIL/uL (ref 4.22–5.81)
RDW: 15.7 % — ABNORMAL HIGH (ref 11.5–15.5)
WBC: 9.6 K/uL (ref 4.0–10.5)
nRBC: 0.2 % (ref 0.0–0.2)

## 2024-04-24 LAB — BRAIN NATRIURETIC PEPTIDE: B Natriuretic Peptide: 695 pg/mL — ABNORMAL HIGH (ref 0.0–100.0)

## 2024-04-24 MED ORDER — CARVEDILOL 3.125 MG PO TABS
3.1250 mg | ORAL_TABLET | Freq: Two times a day (BID) | ORAL | Status: DC
  Administered 2024-04-24 – 2024-04-25 (×3): 3.125 mg via ORAL
  Filled 2024-04-24 (×3): qty 1

## 2024-04-24 MED ORDER — ROSUVASTATIN CALCIUM 10 MG PO TABS
5.0000 mg | ORAL_TABLET | Freq: Every day | ORAL | Status: DC
  Administered 2024-04-24: 5 mg via ORAL
  Filled 2024-04-24: qty 1

## 2024-04-24 NOTE — Progress Notes (Signed)
 Daily Progress Note   Patient Name: Wayne Gordon       Date: 04/24/2024 DOB: 24-Jun-1934  Age: 89 y.o. MRN#: [id number] Attending Physician: Maree Adron BIRCH, DO Primary Care Physician: Renato Dorothey HERO, NP Admit Date: 04/16/2024  Reason for Consultation/Follow-up: Disposition and Establishing goals of care  Subjective:   89 y.o. male  with past medical history of HFrEF, CAD status post CABG and mitral valve repair 2013, LVEF 20-25%, status post ICD, pacer placement, stage IV CKD, persistent atrial fibrillation/atrial flutter on chronic anticoagulation with apixaban  status post DCCV 84/25, type 2 diabetes mellitus on insulin , with renal complications, hyperlipidemia, hypertension, coronary artery disease, status post mitral valve repair  admitted on 04/16/2024 with weight gain, increased weakness, and shortness of breath despite being compliant with home oral diuretics.    Patient was admitted for acute on chronic HFrEF.  Treated with IV diuresis.  Cardiology is following.  Hospital course was complicated by varying levels of alertness and now with new oxygen  requirement. Undergoing further workup. Cardiology has been following him as an outpatient and making adjustments to his medications to address symptoms as well. He is also followed by endocrinology for his T2DM. Underwent cardioversion 04/01/2024 and converted to NSR.     04/22/2024: Initial goals of care and advanced directive discussion completed.  Goals are to maintain function and have good quality of life.  Patient's CODE STATUS was updated to DNR/DNI, continue to treat the treatable and pursue further workup in hopes of having him discharged to SNF for rehab and ultimately return home with family.  04/23/2024: Follow-up visit, patient appears to be gradually improving.  Care/advance  directive discussion completed with patient.  He indicates his goal is to regain strength and ultimately return home.  He confirms advance directives are to be DNR/DNI/treat the treatable.  MOST form completed.  Today, labs independently reviewed.  Renal function gradually improving with creatinine 2.75 today versus 2.91 yesterday.  Per notes, baseline creatinine is 1.9-2.1.  Continues to have stable mild hypocalcemia with calcium  level 8.3.  BNP elevated at 695.  This is improved from prior level 7 days ago which was 927.  Diuretics are on hold due to elevated renal function.  Question if his baseline BNP is elevated given severity of his cardiovascular disease.  Although p.o. intake of fluids outweighs output which may represent further fluid retention. Plan appears to be to resume diuretics in the next few days and assess response.  White blood cell count has normalized at 9.6 with prior levels being up to 13.32 days ago.  Hemoglobin stable in the 12 range.  Patient continues on both oral and IV antibiotics.  Vital signs appear relatively stable with SBP in the 110s.  Medication administration record reviewed.  No as needed symptom medicines required on 24-hour look back.  Called and spoke with patient's wife.  Provided update.  She is thankful for the update and that he continues to improve.  Plan continues to be discharged to SNF for skilled rehab and ultimately hopefully return home.  Provided some education per wife's request regarding heart failure and ways to avoid future exacerbation.  Chart review/care coordination:  Completed extensive chart review including EPIC notes, labs, medication administration record, vital signs. Directly coordinated care with attending physician, bedside nursing staff, and TOC.  Also provided update to wife via phone.   Length of Stay: 8   Physical Exam Constitutional:      General: He is not in acute distress.    Appearance: He is not toxic-appearing.  HENT:      Mouth/Throat:     Mouth: Mucous membranes are moist.  Pulmonary:     Effort: Pulmonary effort is normal. No respiratory distress.  Skin:    General: Skin is warm and dry.  Neurological:     Comments: Drowsy but much improved from prior assessments             Vital Signs: BP 112/63   Pulse 91   Temp 97.7 F (36.5 C) (Oral)   Resp 18   Ht 6' 6 (1.981 m)   Wt 112.7 kg   SpO2 93%   BMI 28.71 kg/m  SpO2: SpO2: 93 % O2 Device: O2 Device: Nasal Cannula O2 Flow Rate: O2 Flow Rate (L/min): 2 L/min      Palliative Assessment/Data: 60%   Palliative Care Assessment & Plan   Patient Profile/Assessment:  89 year old male admitted 04/16/2024 for weight gain, increased weakness, and shortness of breath diagnosed with acute on chronic HFrEF. Initially treated with IV diuresis. He did have a rise in his renal function and therefore IV diuresis has since been placed on hold. Patient noted to have varying levels of alertness and so further workup was pursued. Patient diagnosed with pneumonia and started on dual antibiotic therapy. Palliative team initial consult completed 04/22/2024. Mostly completed with patient's daughter and wife as he was unable to contribute much to history due to lethargy. Completed goals of care and advanced directive discussion. Goal is to go to the [facility] for rehab and get stronger and ultimately return home. Quality of life is important for the patient. Patient was transition to DNR/DNI, treat the treatable. Followed up with patient again on 04/23/2024 and he confirms goals of care and advanced directives. MOST and goldenrod have been completed. Seen daily and continues to make improvements in clinical status and as evidenced by objective data. Continue plan for SNF d/c with OP Pall support. Inpatient palliative team will to continue to follow.    Recommendations/Plan:  DNR/DNI Treat the treatable Outpatient palliative versus hospice follow-up at discharge  depending on patient status (likely SNF for rehab, hopefully return home, OP Pall support) Symptoms well-controlled at present, continue current symptom regimen Palliative medicine team will continue to follow for ongoing goals of care discussion, symptom management, and coordination of care.   Symptom management:  Symptoms stable at present, therefore continue symptom regimen per admitting team with PMT available as needed for support   Prognosis:  Unable to determine    Discharge Planning: Skilled Nursing Facility for rehab with Palliative care service follow-up  Detailed review of medical records (labs, imaging, vital signs), medically appropriate exam, discussed with treatment team, counseling and education to patient, family, & staff, documenting clinical information, medication management, coordination of care   Total time: I spent 35 minutes in the care of the patient today in the above activities and documenting the encounter.   Laymon CHRISTELLA Pinal, NP  Palliative Medicine Team Team phone # [phone]  Thank you for allowing the Palliative Medicine Team to assist in the care of this patient. Please utilize secure chat with additional questions, if there is no response within 30 minutes please call the above phone number.  Palliative Medicine Team providers are available by phone from 7am to 7pm daily and can be reached through the team cell phone.  Should this patient require assistance outside of these hours, please call the patient's attending physician.

## 2024-04-24 NOTE — Plan of Care (Signed)
  Problem: Coping: Goal: Ability to adjust to condition or change in health will improve Outcome: Adequate for Discharge   Problem: Metabolic: Goal: Ability to maintain appropriate glucose levels will improve Outcome: Adequate for Discharge   Problem: Nutritional: Goal: Maintenance of adequate nutrition will improve Outcome: Adequate for Discharge

## 2024-04-24 NOTE — Progress Notes (Signed)
 PROGRESS NOTE    Wayne Gordon  FMW:[id number] DOB: [birth year] DOA: 04/16/2024 PCP: Renato Dorothey HERO, NP   Brief Narrative:    89 year old male with HFrEF, CAD status post CABG and mitral valve repair 2013, LVEF 20-25%, status post ICD, pacer placement, stage IV CKD, persistent atrial fibrillation/atrial flutter on chronic anticoagulation with apixaban  status post DCCV 84/25, ype 2 diabetes mellitus on insulin , with renal complications, hyperlipidemia, hypertension, coronary artery disease, status post mitral valve repair, recent hospitalizations, recently discharged 04/01/2024 with a discharge weight of 234 pounds at that time.  EP had performed a cardioversion on 8/4 for symptomatic atrial flutter.  The patient was admitted to the hospital from 03/29/2024 to 04/01/2024 for acute on chronic HFrEF.   He was discharged home on furosemide  40 mg p.o. daily.  Since hospital discharge, his weight has steadily climbed despite taking oral diuretics at home.  He is now up to 247 pounds.  He is increasingly becoming weaker.  He saw cardiology in the outpatient setting recently and was taken off amiodarone  04/05/24 due to patient report of arm and leg weakness.  He was brought into the emergency department because he continued to have swelling and weight gain in his legs and worsening shortness of breath.  He complained of severe fatigue and weakness and was unable to get out of bed this morning because he was so weak.  He was advised by the cardiology office to call EMS and come to the ED.  He was noted to be severely deconditioned and frail with significant peripheral edema, elevated BNP of 681, troponin 25, potassium 3.4, creatinine 2.75 and request was made for inpatient hospitalization for IV diuresis as he is failing outpatient diuresis with oral medication in part due to advanced renal failure.   The patient is show some initial clinical improvement.  Unfortunately, he continued to remain severely deconditioned.   Although his I's and O's were incomplete, he did show ample diuresis.  Unfortunately, he developed acute on chronic renal failure in part due to cardiorenal syndrome.  He developed new oxygen  requirement on 2 L.  He was somewhat somnolent throughout the hospitalization but aroused and follow commands.  On 04/22/2024, the patient was more somnolent.  Further workup was initiated.  His diuretics were placed on hold secondary to uptrend of his serum creatinine.  Palliative medicine was consulted to initiate goals of care discussions and patient is DNR.  Assessment & Plan:   Principal Problem:   Acute HFrEF (heart failure with reduced ejection fraction) (HCC) Active Problems:   S/P mitral valve repair   S/P CABG x 3   Long term (current) use of anticoagulants   Ischemic cardiomyopathy   HTN (hypertension)   S/P MVR (mitral valve repair)   Automatic implantable cardioverter-defibrillator in situ   Hyperlipidemia   GERD (gastroesophageal reflux disease)   Gonadotropin deficiency (HCC)   Atrial fibrillation (HCC)   Ventricular arrhythmia   Type 2 diabetes mellitus without complication (HCC)   Acute respiratory failure with hypoxia (HCC)   History of implantable cardioverter-defibrillator (ICD) placement   Atrial flutter (HCC)   Chronic kidney disease (CKD), stage IV (severe) (HCC)   Abnormal weight gain   Hypokalemia   Acute renal failure superimposed on stage 4 chronic kidney disease (HCC)   Acute on chronic HFrEF (heart failure with reduced ejection fraction) (HCC)   Acute metabolic encephalopathy   Lobar pneumonia (HCC)  Assessment and Plan:   Acute on chronic HFrEF - 03/30/2024 echo EF 20-25%,  global HK, mild decreased RVF, s/p MV annuloplasty - 8/24--transitioned to po torsemide  - 04/22/24 diuretics on hold due to uptrend in creatinine, continue to hold and follow trend - Cardiology consult appreciated - Daily weights--?accuracy - Accurate I's and O's--NEG 6L - 04/01/2024 discharge  weight 234 lbs - Admission weight 247 lbs   Persistent atrial fibrillation - Status post DCCV 04/01/2024 - Continue carvedilol >>holding due to soft BPs now - Continue apixaban  - Previously on amiodarone , but discontinued secondary to dizziness and generalized weakness - remains rate controlled   Acute on chronic CKD stage IV - Baseline creatinine 1.9-2.1 - serum creatinine appears to be downward trending, continue to monitor - serum creatinine trending up>>hold diuretics 8/25>> - Secondary to cardiorenal syndrome   Acute respiratory failure with hypoxia-multifactorial -he is now on 2L - due to pulmonary edema and pneumonia -PCT 0.49 - 04/22/24 CT chest--Areas of right lower lobe consolidation and volume loss; Mild patchy interstitial prominence in the superior segment right lower lobe, posterior right upper lobe and in the left lower lobe     Acute Metabolic Encephalopathy -04/22/24--more somnolent - due to pneumonia and acute on chronic renal failure -repeat UA--neg for pyuria - 04/22/24 ABG--7.34/51/69/27 on 2L -CT brain--neg -ammonia--33 - B12--1326 - Folic acid--19.7 - TSH--4.368 - CK 509>>690>>519>>348 - 04/23/24--mental status much improved, but not back to baseline   Coronary artery disease /elevated troponin - Status post CABG -Secondary to demand ischemia - No angina - holding Crestor  temporarily due to elevated CK - Currently on apixaban    Diabetes mellitus type 2, uncontrolled with hyperglycemia, improving - 03/29/2024 hemoglobin A1c 7.7 - Continue NovoLog  sliding scale - Continue Semglee  reduced dose - He is on Toujeo  26 units daily at home   Generalized weakness - Secondary to decompensated heart failure and electrolyte derangements and AKI - B12--1326 - Folic acid--19.7 - TSH--4.368 - PT evaluation>>SNF - UA no pyuria - CK 509>>690>>519   Mixed hyperlipidemia - Resume statin now that CK levels are downward trending   Sinus node dysfunction s/p ICD  2013 with upgrade of Biotronik BiV ICD 09/2020 - Device interrogation 02/2024: AP/VP rhythm with no treated arrhythmias - Continue to follow with EP   Goals of Care -palliative following -DNR/DNI    DVT prophylaxis:apixaban  Code Status: DNR Family Communication: None at bedside Disposition Plan:  Status is: Inpatient Remains inpatient appropriate because: Need for close monitoring and IV medications.   Consultants:  Cardiology Palliative care  Procedures:  None  Antimicrobials:  Anti-infectives (From admission, onward)    Start     Dose/Rate Route Frequency Ordered Stop   04/22/24 2200  doxycycline  (VIBRA -TABS) tablet 100 mg        100 mg Oral Every 12 hours 04/22/24 1807     04/22/24 1930  ceFEPIme  (MAXIPIME ) 2 g in sodium chloride  0.9 % 100 mL IVPB        2 g 200 mL/hr over 30 Minutes Intravenous Every 24 hours 04/22/24 1803         Subjective: Patient seen and evaluated today with no new acute complaints or concerns. No acute concerns or events noted overnight.  He seems to think his shortness of breath is improving and continues to have a cough that is nonproductive.  Objective: Vitals:   04/24/24 0747 04/24/24 0834 04/24/24 0906 04/24/24 1013  BP: 112/63  112/63   Pulse: 91  91   Resp:      Temp: 97.7 F (36.5 C)     TempSrc: Oral  SpO2: 92% 93%    Weight:    112.7 kg  Height:        Intake/Output Summary (Last 24 hours) at 04/24/2024 1016 Last data filed at 04/24/2024 0917 Gross per 24 hour  Intake 683 ml  Output 200 ml  Net 483 ml   Filed Weights   04/23/24 0448 04/24/24 0430 04/24/24 1013  Weight: 111.9 kg 112.5 kg 112.7 kg    Examination:  General exam: Appears calm and comfortable  Respiratory system: Clear to auscultation. Respiratory effort normal.  2 L nasal cannula. Cardiovascular system: S1 & S2 heard, RRR.  Gastrointestinal system: Abdomen is soft Central nervous system: Alert and awake Extremities: 2+ pitting edema  bilaterally Skin: No significant lesions noted Psychiatry: Flat affect.    Data Reviewed: I have personally reviewed following labs and imaging studies  CBC: Recent Labs  Lab 04/18/24 0410 04/19/24 0407 04/20/24 0341 04/21/24 0322 04/22/24 0954 04/23/24 0418 04/24/24 0412  WBC 11.1* 10.2 10.2 11.8* 13.3* 11.3* 9.6  NEUTROABS 8.6* 7.7 7.4  --   --   --   --   HGB 12.8* 12.3* 12.0* 12.7* 13.2 12.0* 12.3*  HCT 41.0 38.5* 38.5* 40.5 41.8 38.9* 39.8  MCV 92.1 92.3 93.2 93.1 93.1 93.3 93.6  PLT 172 155 151 145* 169 178 230   Basic Metabolic Panel: Recent Labs  Lab 04/18/24 0410 04/19/24 0407 04/20/24 0341 04/21/24 0322 04/22/24 0438 04/22/24 1400 04/23/24 0418 04/24/24 0412  NA 138 134* 137 136 135  --  137 139  K 4.2 4.2 4.3 4.8 5.4* 4.8 4.6 4.4  CL 101 98 101 101 101  --  100 101  CO2 26 24 27 26 25   --  26 28  GLUCOSE 160* 162* 139* 165* 155*  --  124* 125*  BUN 42* 41* 45* 49* 58*  --  64* 64*  CREATININE 2.68* 2.62* 2.54* 2.65* 2.96*  --  2.91* 2.75*  CALCIUM  8.4* 8.5* 8.3* 8.3* 8.3*  --  8.3* 8.3*  MG 2.4 2.3  --  2.5*  --   --  2.5*  --    GFR: Estimated Creatinine Clearance: 26.2 mL/min (A) (by C-G formula based on SCr of 2.75 mg/dL (H)). Liver Function Tests: Recent Labs  Lab 04/22/24 0954  AST 55*  ALT 44  ALKPHOS 94  BILITOT 2.2*  PROT 6.2*  ALBUMIN  3.0*   No results for input(s): LIPASE, AMYLASE in the last 168 hours. Recent Labs  Lab 04/22/24 0954  AMMONIA 33   Coagulation Profile: No results for input(s): INR, PROTIME in the last 168 hours. Cardiac Enzymes: Recent Labs  Lab 04/18/24 0410 04/19/24 0407 04/20/24 0341 04/21/24 0322  CKTOTAL 509* 690* 519* 348   BNP (last 3 results) No results for input(s): PROBNP in the last 8760 hours. HbA1C: No results for input(s): HGBA1C in the last 72 hours. CBG: Recent Labs  Lab 04/23/24 1054 04/23/24 1558 04/23/24 2148 04/24/24 0321 04/24/24 0721  GLUCAP 116* 177* 148*  112* 109*   Lipid Profile: No results for input(s): CHOL, HDL, LDLCALC, TRIG, CHOLHDL, LDLDIRECT in the last 72 hours. Thyroid Function Tests: No results for input(s): TSH, T4TOTAL, FREET4, T3FREE, THYROIDAB in the last 72 hours. Anemia Panel: No results for input(s): VITAMINB12, FOLATE, FERRITIN, TIBC, IRON, RETICCTPCT in the last 72 hours. Sepsis Labs: Recent Labs  Lab 04/18/24 0410 04/21/24 0322  PROCALCITON 0.68 0.49    Recent Results (from the past 240 hours)  Resp panel by RT-PCR (RSV, Flu A&B,  Covid) Anterior Nasal Swab     Status: None   Collection Time: 04/16/24 11:04 AM   Specimen: Anterior Nasal Swab  Result Value Ref Range Status   SARS Coronavirus 2 by RT PCR NEGATIVE NEGATIVE Final    Comment: (NOTE) SARS-CoV-2 target nucleic acids are NOT DETECTED.  The SARS-CoV-2 RNA is generally detectable in upper respiratory specimens during the acute phase of infection. The lowest concentration of SARS-CoV-2 viral copies this assay can detect is 138 copies/mL. A negative result does not preclude SARS-Cov-2 infection and should not be used as the sole basis for treatment or other patient management decisions. A negative result may occur with  improper specimen collection/handling, submission of specimen other than nasopharyngeal swab, presence of viral mutation(s) within the areas targeted by this assay, and inadequate number of viral copies(<138 copies/mL). A negative result must be combined with clinical observations, patient history, and epidemiological information. The expected result is Negative.  Fact Sheet for Patients:  BloggerCourse.com  Fact Sheet for Healthcare Providers:  SeriousBroker.it  This test is no t yet approved or cleared by the [place]  FDA and  has been authorized for detection and/or diagnosis of SARS-CoV-2 by FDA under an Emergency Use Authorization (EUA).  This EUA will remain  in effect (meaning this test can be used) for the duration of the COVID-19 declaration under Section 564(b)(1) of the Act, 21 U.S.C.section 360bbb-3(b)(1), unless the authorization is terminated  or revoked sooner.       Influenza A by PCR NEGATIVE NEGATIVE Final   Influenza B by PCR NEGATIVE NEGATIVE Final    Comment: (NOTE) The Xpert Xpress SARS-CoV-2/FLU/RSV plus assay is intended as an aid in the diagnosis of influenza from Nasopharyngeal swab specimens and should not be used as a sole basis for treatment. Nasal washings and aspirates are unacceptable for Xpert Xpress SARS-CoV-2/FLU/RSV testing.  Fact Sheet for Patients: BloggerCourse.com  Fact Sheet for Healthcare Providers: SeriousBroker.it  This test is not yet approved or cleared by the [place]  FDA and has been authorized for detection and/or diagnosis of SARS-CoV-2 by FDA under an Emergency Use Authorization (EUA). This EUA will remain in effect (meaning this test can be used) for the duration of the COVID-19 declaration under Section 564(b)(1) of the Act, 21 U.S.C. section 360bbb-3(b)(1), unless the authorization is terminated or revoked.     Resp Syncytial Virus by PCR NEGATIVE NEGATIVE Final    Comment: (NOTE) Fact Sheet for Patients: BloggerCourse.com  Fact Sheet for Healthcare Providers: SeriousBroker.it  This test is not yet approved or cleared by the [place]  FDA and has been authorized for detection and/or diagnosis of SARS-CoV-2 by FDA under an Emergency Use Authorization (EUA). This EUA will remain in effect (meaning this test can be used) for the duration of the COVID-19 declaration under Section 564(b)(1) of the Act, 21 U.S.C. section 360bbb-3(b)(1), unless the authorization is terminated or revoked.  Performed at [facility], [address] [place], [place]  [address]   Respiratory (~20 pathogens) panel by PCR     Status: None   Collection Time: 04/18/24  4:55 PM   Specimen: Nasopharyngeal Swab; Respiratory  Result Value Ref Range Status   Adenovirus NOT DETECTED NOT DETECTED Final   Coronavirus 229E NOT DETECTED NOT DETECTED Final    Comment: (NOTE) The Coronavirus on the Respiratory Panel, DOES NOT test for the novel  Coronavirus (2019 nCoV)    Coronavirus HKU1 NOT DETECTED NOT DETECTED Final   Coronavirus NL63 NOT DETECTED NOT DETECTED Final  Coronavirus OC43 NOT DETECTED NOT DETECTED Final   Metapneumovirus NOT DETECTED NOT DETECTED Final   Rhinovirus / Enterovirus NOT DETECTED NOT DETECTED Final   Influenza A NOT DETECTED NOT DETECTED Final   Influenza B NOT DETECTED NOT DETECTED Final   Parainfluenza Virus 1 NOT DETECTED NOT DETECTED Final   Parainfluenza Virus 2 NOT DETECTED NOT DETECTED Final   Parainfluenza Virus 3 NOT DETECTED NOT DETECTED Final   Parainfluenza Virus 4 NOT DETECTED NOT DETECTED Final   Respiratory Syncytial Virus NOT DETECTED NOT DETECTED Final   Bordetella pertussis NOT DETECTED NOT DETECTED Final   Bordetella Parapertussis NOT DETECTED NOT DETECTED Final   Chlamydophila pneumoniae NOT DETECTED NOT DETECTED Final   Mycoplasma pneumoniae NOT DETECTED NOT DETECTED Final    Comment: Performed at [facility], [address] [place], [place] [address]  MRSA Next Gen by PCR, Nasal     Status: None   Collection Time: 04/22/24  5:58 PM   Specimen: Nasal Mucosa; Nasal Swab  Result Value Ref Range Status   MRSA by PCR Next Gen NOT DETECTED NOT DETECTED Final    Comment: (NOTE) The GeneXpert MRSA Assay (FDA approved for NASAL specimens only), is one component of a comprehensive MRSA colonization surveillance program. It is not intended to diagnose MRSA infection nor to guide or monitor treatment for MRSA infections. Test performance is not FDA approved in patients less than 39 years old. Performed at [facility], [address], [place], [place] [address]          Radiology Studies: CT HEAD WO CONTRAST ( ) Result Date: 04/22/2024 CLINICAL DATA:  Initial evaluation for mental status change, unknown cause. EXAM: CT HEAD WITHOUT CONTRAST TECHNIQUE: Contiguous axial images were obtained from the base of the skull through the vertex without intravenous contrast. RADIATION DOSE REDUCTION: This exam was performed according to the departmental dose-optimization program which includes automated exposure control, adjustment of the mA and/or kV according to patient size and/or use of iterative reconstruction technique. COMPARISON:  CT from 07/10/2015. FINDINGS: Brain: Cerebral volume within normal limits. Mild chronic microvascular ischemic disease for age. Few small remote cerebellar infarcts noted bilaterally. No acute intracranial hemorrhage. No acute large vessel territory infarct. No mass lesion, midline shift or mass effect. No hydrocephalus or extra-axial fluid collection. Vascular: No abnormal hyperdense vessel. Calcified atherosclerosis present at the skull base. Skull: Scalp soft tissues demonstrate no acute abnormality. Calvarium intact. Sinuses/Orbits: Globes and orbital soft tissues within normal limits. 2.2 cm ovoid expansile lesion seen adjacent to the left middle turbinate, which could reflect a mucocele or possibly polyp (series 3, image 26). Paranasal sinuses are otherwise largely clear. Trace chronic appearing right mastoid effusion noted. Other: None. IMPRESSION: 1. No acute intracranial abnormality. 2. Mild chronic microvascular ischemic disease with a few small remote cerebellar infarcts. 3. 2.2 cm ovoid expansile lesion adjacent to the left middle turbinate, which could reflect a mucocele or possibly polyp. Correlation with physical exam recommended. Electronically Signed   By: Morene Hoard M.D.   On: 04/22/2024 19:38   CT CHEST WO CONTRAST Result Date: 04/22/2024 CLINICAL DATA:   Persistent hypoxia and dyspnea. EXAM: CT CHEST WITHOUT CONTRAST TECHNIQUE: Multidetector CT imaging of the chest was performed following the standard protocol without IV contrast. RADIATION DOSE REDUCTION: This exam was performed according to the departmental dose-optimization program which includes automated exposure control, adjustment of the mA and/or kV according to patient size and/or use of iterative reconstruction technique. COMPARISON:  Chest radiographs  dated 04/19/2024 and 04/16/2024. FINDINGS: Cardiovascular: Atheromatous calcifications, including the coronary arteries and aorta. Enlarged heart. No pericardial effusion. Post CABG changes. Left subclavian pacer and AICD leads. Mediastinum/Nodes: Small amount of high density material in the distal esophagus. Unremarkable trachea and thyroid gland. No enlarged lymph nodes. Lungs/Pleura: Small right pleural effusion. Areas of right lower lobe consolidation and volume loss. Mild patchy interstitial prominence in the superior segment right lower lobe, posterior right upper lobe and in the left lower lobe. There is also some patchy density in the lingula. Upper Abdomen: Elevated right hemidiaphragm. Air in the intrahepatic biliary tree. Atheromatous calcifications. Scattered colonic diverticula without evidence of diverticulitis. Musculoskeletal: Healing left posterolateral 9th rib fracture. Thoracic and lower cervical spine degenerative changes with ossification of the anterior longitudinal ligament in the mid and lower thoracic spine. IMPRESSION: 1. Areas of right lower lobe consolidation and volume loss, compatible with atelectasis and possible pneumonia. 2. Mild patchy interstitial prominence in the superior segment right lower lobe, posterior right upper lobe and in the left lower lobe. This could be due to pneumonia or mild pulmonary edema. 3. Patchy density in the lingula suspicious for pneumonia. 4. Small right pleural effusion. 5. Cardiomegaly. 6.  Healing left posterolateral 9th rib fracture. 7. Calcific coronary artery and aortic atherosclerosis. 8. Air in the intrahepatic biliary tree, most likely due to previous sphincterotomy. 9. Colonic diverticulosis. Aortic Atherosclerosis (ICD10-I70.0). Electronically Signed   By: Elspeth Bathe M.D.   On: 04/22/2024 15:50        Scheduled Meds:  apixaban   2.5 mg Oral BID   arformoterol   15 mcg Nebulization BID   budesonide  (PULMICORT ) nebulizer solution  0.5 mg Nebulization BID   carvedilol   3.125 mg Oral BID WC   dapagliflozin  propanediol  10 mg Oral QHS   doxycycline   100 mg Oral Q12H   insulin  aspart  0-9 Units Subcutaneous TID WC   insulin  glargine-yfgn  8 Units Subcutaneous QHS   pantoprazole   40 mg Oral QAC breakfast   polyethylene glycol  17 g Oral Daily   senna  2 tablet Oral Daily   sodium chloride  flush  3 mL Intravenous Q12H   Continuous Infusions:  ceFEPime  (MAXIPIME ) IV 2 g (04/23/24 2036)     LOS: 8 days    Time spent: 55 minutes    Manessa Buley D Maree, DO [facility]  If 7PM-7AM, please contact night-coverage [email or web address] 04/24/2024, 10:16 AM

## 2024-04-24 NOTE — Progress Notes (Cosign Needed)
 Rounding Note   Patient Name: Wayne Gordon Date of Encounter: 04/24/2024  [facility] Cardiologist: Vishnu P Mallipeddi, MD   Subjective  Reports he is feeling much better. Breathing improved but not back to baseline. No chest pain or palpitations.   Scheduled Meds:  apixaban   2.5 mg Oral BID   arformoterol   15 mcg Nebulization BID   budesonide  (PULMICORT ) nebulizer solution  0.5 mg Nebulization BID   carvedilol   12.5 mg Oral BID WC   dapagliflozin  propanediol  10 mg Oral QHS   doxycycline   100 mg Oral Q12H   insulin  aspart  0-9 Units Subcutaneous TID WC   insulin  glargine-yfgn  8 Units Subcutaneous QHS   pantoprazole   40 mg Oral QAC breakfast   polyethylene glycol  17 g Oral Daily   senna  2 tablet Oral Daily   sodium chloride  flush  3 mL Intravenous Q12H   Continuous Infusions:  ceFEPime  (MAXIPIME ) IV 2 g (04/23/24 2036)   PRN Meds: acetaminophen , guaiFENesin , ondansetron  (ZOFRAN ) IV, sodium chloride  flush   Vital Signs  Vitals:   04/23/24 1946 04/23/24 2010 04/24/24 0430 04/24/24 0536  BP:  (!) 109/59  100/64  Pulse:  87  77  Resp:  (!) 22  18  Temp:  97.7 F (36.5 C)  (!) 97.5 F (36.4 C)  TempSrc:  Oral  Oral  SpO2: 93% 92%  94%  Weight:   112.5 kg   Height:        Intake/Output Summary (Last 24 hours) at 04/24/2024 9287 Last data filed at 04/24/2024 0434 Gross per 24 hour  Intake 200 ml  Output --  Net 200 ml      04/24/2024    4:30 AM 04/23/2024    4:48 AM 04/21/2024    5:00 AM  Last 3 Weights  Weight (lbs) 248 lb 0.3 oz 246 lb 11.1 oz 251 lb 8.7 oz  Weight (kg) 112.5 kg 111.9 kg 114.1 kg      Telemetry AV paced, HR in 70's to 80's. 5 beats NSVT.  - Personally Reviewed  ECG  AV paced rhythm, HR 76.  - Personally Reviewed  Physical Exam  GEN: Pleasant, elderly male appearing in no acute distress.  Sitting in recliner.  Neck: No JVD Cardiac: RRR, no murmurs, rubs, or gallops.  Respiratory: Occasional rhonchi. No wheezing or  rales.  GI: Soft, nontender, non-distended  MS: 1+ pitting edema bilaterally; No deformity. Neuro:  Nonfocal  Psych: Normal affect   Labs High Sensitivity Troponin:   Recent Labs  Lab 03/29/24 1120 03/29/24 1302 04/16/24 1029 04/16/24 1155  TROPONINIHS 19* 19* 25* 25*     Chemistry Recent Labs  Lab 04/19/24 0407 04/20/24 0341 04/21/24 0322 04/22/24 0438 04/22/24 0954 04/22/24 1400 04/23/24 0418 04/24/24 0412  NA 134*   < > 136 135  --   --  137 139  K 4.2   < > 4.8 5.4*  --  4.8 4.6 4.4  CL 98   < > 101 101  --   --  100 101  CO2 24   < > 26 25  --   --  26 28  GLUCOSE 162*   < > 165* 155*  --   --  124* 125*  BUN 41*   < > 49* 58*  --   --  64* 64*  CREATININE 2.62*   < > 2.65* 2.96*  --   --  2.91* 2.75*  CALCIUM  8.5*   < >  8.3* 8.3*  --   --  8.3* 8.3*  MG 2.3  --  2.5*  --   --   --  2.5*  --   PROT  --   --   --   --  6.2*  --   --   --   ALBUMIN   --   --   --   --  3.0*  --   --   --   AST  --   --   --   --  55*  --   --   --   ALT  --   --   --   --  44  --   --   --   ALKPHOS  --   --   --   --  94  --   --   --   BILITOT  --   --   --   --  2.2*  --   --   --   GFRNONAA 23*   < > 22* 20*  --   --  20* 22*  ANIONGAP 12   < > 9 9  --   --  11 10   < > = values in this interval not displayed.    Lipids No results for input(s): CHOL, TRIG, HDL, LABVLDL, LDLCALC, CHOLHDL in the last 168 hours.  Hematology Recent Labs  Lab 04/22/24 0954 04/23/24 0418 04/24/24 0412  WBC 13.3* 11.3* 9.6  RBC 4.49 4.17* 4.25  HGB 13.2 12.0* 12.3*  HCT 41.8 38.9* 39.8  MCV 93.1 93.3 93.6  MCH 29.4 28.8 28.9  MCHC 31.6 30.8 30.9  RDW 15.4 15.4 15.7*  PLT 169 178 230   Thyroid  Recent Labs  Lab 04/18/24 0410  TSH 4.368  FREET4 2.02*    BNP Recent Labs  Lab 04/24/24 0412  BNP 695.0*    DDimer No results for input(s): DDIMER in the last 168 hours.   Radiology  CT HEAD WO CONTRAST ( ) Result Date: 04/22/2024 CLINICAL DATA:  Initial evaluation  for mental status change, unknown cause. EXAM: CT HEAD WITHOUT CONTRAST TECHNIQUE: Contiguous axial images were obtained from the base of the skull through the vertex without intravenous contrast. RADIATION DOSE REDUCTION: This exam was performed according to the departmental dose-optimization program which includes automated exposure control, adjustment of the mA and/or kV according to patient size and/or use of iterative reconstruction technique. COMPARISON:  CT from 07/10/2015. FINDINGS: Brain: Cerebral volume within normal limits. Mild chronic microvascular ischemic disease for age. Few small remote cerebellar infarcts noted bilaterally. No acute intracranial hemorrhage. No acute large vessel territory infarct. No mass lesion, midline shift or mass effect. No hydrocephalus or extra-axial fluid collection. Vascular: No abnormal hyperdense vessel. Calcified atherosclerosis present at the skull base. Skull: Scalp soft tissues demonstrate no acute abnormality. Calvarium intact. Sinuses/Orbits: Globes and orbital soft tissues within normal limits. 2.2 cm ovoid expansile lesion seen adjacent to the left middle turbinate, which could reflect a mucocele or possibly polyp (series 3, image 26). Paranasal sinuses are otherwise largely clear. Trace chronic appearing right mastoid effusion noted. Other: None. IMPRESSION: 1. No acute intracranial abnormality. 2. Mild chronic microvascular ischemic disease with a few small remote cerebellar infarcts. 3. 2.2 cm ovoid expansile lesion adjacent to the left middle turbinate, which could reflect a mucocele or possibly polyp. Correlation with physical exam recommended. Electronically Signed   By: Morene Hoard M.D.   On: 04/22/2024 19:38   CT  CHEST WO CONTRAST Result Date: 04/22/2024 CLINICAL DATA:  Persistent hypoxia and dyspnea. EXAM: CT CHEST WITHOUT CONTRAST TECHNIQUE: Multidetector CT imaging of the chest was performed following the standard protocol without IV  contrast. RADIATION DOSE REDUCTION: This exam was performed according to the departmental dose-optimization program which includes automated exposure control, adjustment of the mA and/or kV according to patient size and/or use of iterative reconstruction technique. COMPARISON:  Chest radiographs dated 04/19/2024 and 04/16/2024. FINDINGS: Cardiovascular: Atheromatous calcifications, including the coronary arteries and aorta. Enlarged heart. No pericardial effusion. Post CABG changes. Left subclavian pacer and AICD leads. Mediastinum/Nodes: Small amount of high density material in the distal esophagus. Unremarkable trachea and thyroid gland. No enlarged lymph nodes. Lungs/Pleura: Small right pleural effusion. Areas of right lower lobe consolidation and volume loss. Mild patchy interstitial prominence in the superior segment right lower lobe, posterior right upper lobe and in the left lower lobe. There is also some patchy density in the lingula. Upper Abdomen: Elevated right hemidiaphragm. Air in the intrahepatic biliary tree. Atheromatous calcifications. Scattered colonic diverticula without evidence of diverticulitis. Musculoskeletal: Healing left posterolateral 9th rib fracture. Thoracic and lower cervical spine degenerative changes with ossification of the anterior longitudinal ligament in the mid and lower thoracic spine. IMPRESSION: 1. Areas of right lower lobe consolidation and volume loss, compatible with atelectasis and possible pneumonia. 2. Mild patchy interstitial prominence in the superior segment right lower lobe, posterior right upper lobe and in the left lower lobe. This could be due to pneumonia or mild pulmonary edema. 3. Patchy density in the lingula suspicious for pneumonia. 4. Small right pleural effusion. 5. Cardiomegaly. 6. Healing left posterolateral 9th rib fracture. 7. Calcific coronary artery and aortic atherosclerosis. 8. Air in the intrahepatic biliary tree, most likely due to previous  sphincterotomy. 9. Colonic diverticulosis. Aortic Atherosclerosis (ICD10-I70.0). Electronically Signed   By: Elspeth Bathe M.D.   On: 04/22/2024 15:50    Cardiac Studies  Echocardiogram: 03/2024 IMPRESSIONS     1. No LV thrombus by Definity . Left ventricular ejection fraction, by  estimation, is 20 to 25%. The left ventricle has severely decreased  function. The left ventricle demonstrates global hypokinesis. The left  ventricular internal cavity size was mildly  dilated. There is mild concentric left ventricular hypertrophy. Left  ventricular diastolic parameters are indeterminate. There is the  interventricular septum is flattened in systole and diastole, consistent  with right ventricular pressure and volume  overload.   2. Right ventricular systolic function is mildly reduced. The right  ventricular size is moderately enlarged. There is normal pulmonary artery  systolic pressure. The estimated right ventricular systolic pressure is  32.8 mmHg.   3. Left atrial size was mildly dilated.   4. S/p Sorin Memo 3D ring annuloplasty (size 30mm, Catalog #[id number], serial  [id number]). The mitral valve has been repaired/replaced. Mild mitral valve  regurgitation. No evidence of mitral stenosis. The mean mitral valve  gradient is 3.0 mmHg with average  heart rate of 85 bpm. Procedure Date: 10/17/11. Echo findings are  consistent with normal structure and function of the mitral valve  prosthesis.   5. The tricuspid valve is abnormal. Tricuspid valve regurgitation is  moderate.   6. The aortic valve is tricuspid. There is moderate calcification of the  aortic valve. There is moderate thickening of the aortic valve. Aortic  valve regurgitation is trivial. Aortic valve sclerosis/calcification is  present, without any evidence of  aortic stenosis.   7. The inferior vena cava is normal in size with <  50% respiratory  variability, suggesting right atrial pressure of 8 mmHg.    Patient Profile   89  y.o. male w/ PMH of  HFrEF/ICM (EF 20-25% in 2013 and similar results by repeat echo in 08/2017, VT and sinus node dysfunction (s/p ICD placement in 2013 with upgrade to [employer] Bi-V ICD in 09/2020), CAD (s/p CABG in 2013 with mitral valve repair), persistent atrial fibrillation/flutter (s/p DCCV in 03/2024), HTN, HLD, Type 2 DM and Stage 4 CKD who is currently admitted for an acute CHF exacerbation.    Assessment & Plan   1. Acute HFrEF - He has a longstanding cardiomyopathy and EF was at 20 to 25% by most recent imaging earlier this month with mildly reduced RV function. Presented with a 13 pound weight gain and BNP at 927. - He was initially receiving IV Lasix  and was transitioned to Torsemide  40 mg daily but diuretic therapy has been held since 04/21/2024 given worsening renal function. Weight has declined from a peak of 253 lbs to 248 lbs (was at 248 lbs during office visit on 04/11/2024). Will ask for a standing weight to be checked today. Net output of -6.0 L thus far. Renal function improving but not yet back to baseline (2.96 --> 2.91 --> 2.75). Can likely restart diuretics in the next 24-48 hours.   - In regard to GMDT, listed as taking Coreg  12.5mg  BID and Farxiga  10mg  daily but the past 3 doses of Coreg  have been held given hypotension. Will reduce Coreg  to 3.125mg  BID. Not an an ACE-I/ARB/ARNI/MRA given renal function.   2. CAD - Previously underwent CABG in 2013. He denies any recent anginal symptoms and Hs troponin values have been flat at 25 this admission which is most consistent with demand ischemia as compared to ACS. - He is not on ASA given the need for anticoagulation. Continue Coreg  with dose reduction. On Crestor  prior to admission but held given elevated CK at the time of admission. Would resume at discharge.  3. History of mitral valve repair - Echocardiogram earlier this month showed prior ring annuloplasty with mild MR and no evidence of mitral stenosis.  Overall, mitral  valve prosthesis was functioning normally.  4. Persistent Atrial Fibrillation/Flutter - He underwent DCCV on 04/01/2024 and by review of EP notes, was recommended be on Amiodarone  400 mg twice daily for 7 days followed by 200 mg twice daily for 7 days then 200 mg daily. However, he contacted the office later that month reporting worsening weakness and Dr. Waddell recommended stopping Amiodarone  on 04/05/2024. - Maintaining NSR this admission. Continue Coreg  but will reduce dosing as outlined above since recent doses have been held given soft BP.  - Continue Eiquis 2.5mg  BID which is the appropriate dose given his current age, weight and renal function.  5. PNA - CT this admission consistent with pneumonia and he has been started on Cefepime  and Doxycycline . Management per the admitting team.  6. Acute on Chronic Sage 4 CKD - Baseline creatinine 1.9 - 2.1. Peaked at 2.91 this admission and at 2.75 today.   For questions or updates, please contact [facility] Please consult [email or web address] for contact info under     Signed, Laymon CHRISTELLA Qua, PA-C  04/24/2024, 7:12 AM    Attending note  Patient seen and discussed with PA Qua, I agree with her documentation.  1.Acute on chronic combined systolic/diastolic HF - 09/2021 echo: LVE 25% - 03/2024 echo: LVEF 20-25%, indet diastolic function, D shaped septum, mild RV dysfunction -  presented with 13 lbs weight gain - CXR no acute process. BNP 927   - had been on IV lasix  80mg  tid, transitioned  to oral torsemide  40mg . Uptrend in Cr diuretics have been held. Cr remains elevated, continue to hold diuretics today.  - reds vest has been low quality -Weights appear inaccurate.  - medical therapy with coreg  12.5mg  bid, farxiga  10mg . In general medical therapy limited by renal dysfunction, soft bp's at times. Lower coreg  to 3.125mg  bid due to soft bp's - check standing weight when able, patient lethargic this AM hold for today.  - CT chest   with areas of RLL consolidation, possible pneumonia. Mild patch prominence pneumonia vs pulm edema, patchy density lingula concerning for pneumonia. Procalc elevated this admission    - essentially looks to be fairly well diuresed, lingering SOB looks to be more related to pneumonia. Restart diuretic pending renal function over next few days.      2. CAD with prior CABG - no acute issues this admission.      3. Persistent afib - prior DCCV 03/2024 - on coreg , eliquis .    4. AMS - per primary team - likely secondary to pneumonia, primary team has started antibiotics.     5. Pneumonia - - CT chest yesterday with areas of RLL consolidation, possible pneumonia. Mild patch prominence pneumonia vs pulm edema, patchy density lingula concerning for pneumonia. Procalc elevated this admission  - abx per primary team   Dorn Ross MD

## 2024-04-24 NOTE — Plan of Care (Signed)

## 2024-04-24 NOTE — Progress Notes (Signed)
 Mobility Specialist Progress Note:    04/24/24 1300  Mobility  Activity Ambulated with assistance  Level of Assistance Maximum assist, patient does 25-49% (+2)  Assistive Device Front wheel walker  Distance Ambulated (ft) 6 ft  Range of Motion/Exercises Active;All extremities  Activity Response Tolerated well  Mobility Referral Yes  Mobility visit 1 Mobility  Mobility Specialist Start Time (ACUTE ONLY) 1300  Mobility Specialist Stop Time (ACUTE ONLY) 1320  Mobility Specialist Time Calculation (min) (ACUTE ONLY) 20 min   Pt received on toilet, NT requesting assistance moving to chair. Required Max +2 to stand and ambulate with RW. Tolerated well, asx throughout. NT in room, all needs met.  Maleko Greulich Mobility Specialist Please contact via Special educational needs teacher or  Rehab office at [phone]

## 2024-04-24 NOTE — Progress Notes (Incomplete Revision)
 Rounding Note   Patient Name: Kauan Kloosterman Spraker Date of Encounter: 04/24/2024  [facility] Cardiologist: Vishnu P Mallipeddi, MD   Subjective  Reports he is feeling much better. Breathing improved but not back to baseline. No chest pain or palpitations.   Scheduled Meds:  apixaban   2.5 mg Oral BID   arformoterol   15 mcg Nebulization BID   budesonide  (PULMICORT ) nebulizer solution  0.5 mg Nebulization BID   carvedilol   12.5 mg Oral BID WC   dapagliflozin  propanediol  10 mg Oral QHS   doxycycline   100 mg Oral Q12H   insulin  aspart  0-9 Units Subcutaneous TID WC   insulin  glargine-yfgn  8 Units Subcutaneous QHS   pantoprazole   40 mg Oral QAC breakfast   polyethylene glycol  17 g Oral Daily   senna  2 tablet Oral Daily   sodium chloride  flush  3 mL Intravenous Q12H   Continuous Infusions:  ceFEPime  (MAXIPIME ) IV 2 g (04/23/24 2036)   PRN Meds: acetaminophen , guaiFENesin , ondansetron  (ZOFRAN ) IV, sodium chloride  flush   Vital Signs  Vitals:   04/23/24 1946 04/23/24 2010 04/24/24 0430 04/24/24 0536  BP:  (!) 109/59  100/64  Pulse:  87  77  Resp:  (!) 22  18  Temp:  97.7 F (36.5 C)  (!) 97.5 F (36.4 C)  TempSrc:  Oral  Oral  SpO2: 93% 92%  94%  Weight:   112.5 kg   Height:        Intake/Output Summary (Last 24 hours) at 04/24/2024 9287 Last data filed at 04/24/2024 0434 Gross per 24 hour  Intake 200 ml  Output --  Net 200 ml      04/24/2024    4:30 AM 04/23/2024    4:48 AM 04/21/2024    5:00 AM  Last 3 Weights  Weight (lbs) 248 lb 0.3 oz 246 lb 11.1 oz 251 lb 8.7 oz  Weight (kg) 112.5 kg 111.9 kg 114.1 kg      Telemetry AV paced, HR in 70's to 80's. 5 beats NSVT.  - Personally Reviewed  ECG  AV paced rhythm, HR 76.  - Personally Reviewed  Physical Exam  GEN: Pleasant, elderly male appearing in no acute distress.  Sitting in recliner.  Neck: No JVD Cardiac: RRR, no murmurs, rubs, or gallops.  Respiratory: Occasional rhonchi. No wheezing or  rales.  GI: Soft, nontender, non-distended  MS: 1+ pitting edema bilaterally; No deformity. Neuro:  Nonfocal  Psych: Normal affect   Labs High Sensitivity Troponin:   Recent Labs  Lab 03/29/24 1120 03/29/24 1302 04/16/24 1029 04/16/24 1155  TROPONINIHS 19* 19* 25* 25*     Chemistry Recent Labs  Lab 04/19/24 0407 04/20/24 0341 04/21/24 0322 04/22/24 0438 04/22/24 0954 04/22/24 1400 04/23/24 0418 04/24/24 0412  NA 134*   < > 136 135  --   --  137 139  K 4.2   < > 4.8 5.4*  --  4.8 4.6 4.4  CL 98   < > 101 101  --   --  100 101  CO2 24   < > 26 25  --   --  26 28  GLUCOSE 162*   < > 165* 155*  --   --  124* 125*  BUN 41*   < > 49* 58*  --   --  64* 64*  CREATININE 2.62*   < > 2.65* 2.96*  --   --  2.91* 2.75*  CALCIUM  8.5*   < >  8.3* 8.3*  --   --  8.3* 8.3*  MG 2.3  --  2.5*  --   --   --  2.5*  --   PROT  --   --   --   --  6.2*  --   --   --   ALBUMIN   --   --   --   --  3.0*  --   --   --   AST  --   --   --   --  55*  --   --   --   ALT  --   --   --   --  44  --   --   --   ALKPHOS  --   --   --   --  94  --   --   --   BILITOT  --   --   --   --  2.2*  --   --   --   GFRNONAA 23*   < > 22* 20*  --   --  20* 22*  ANIONGAP 12   < > 9 9  --   --  11 10   < > = values in this interval not displayed.    Lipids No results for input(s): CHOL, TRIG, HDL, LABVLDL, LDLCALC, CHOLHDL in the last 168 hours.  Hematology Recent Labs  Lab 04/22/24 0954 04/23/24 0418 04/24/24 0412  WBC 13.3* 11.3* 9.6  RBC 4.49 4.17* 4.25  HGB 13.2 12.0* 12.3*  HCT 41.8 38.9* 39.8  MCV 93.1 93.3 93.6  MCH 29.4 28.8 28.9  MCHC 31.6 30.8 30.9  RDW 15.4 15.4 15.7*  PLT 169 178 230   Thyroid  Recent Labs  Lab 04/18/24 0410  TSH 4.368  FREET4 2.02*    BNP Recent Labs  Lab 04/24/24 0412  BNP 695.0*    DDimer No results for input(s): DDIMER in the last 168 hours.   Radiology  CT HEAD WO CONTRAST ( ) Result Date: 04/22/2024 CLINICAL DATA:  Initial evaluation  for mental status change, unknown cause. EXAM: CT HEAD WITHOUT CONTRAST TECHNIQUE: Contiguous axial images were obtained from the base of the skull through the vertex without intravenous contrast. RADIATION DOSE REDUCTION: This exam was performed according to the departmental dose-optimization program which includes automated exposure control, adjustment of the mA and/or kV according to patient size and/or use of iterative reconstruction technique. COMPARISON:  CT from 07/10/2015. FINDINGS: Brain: Cerebral volume within normal limits. Mild chronic microvascular ischemic disease for age. Few small remote cerebellar infarcts noted bilaterally. No acute intracranial hemorrhage. No acute large vessel territory infarct. No mass lesion, midline shift or mass effect. No hydrocephalus or extra-axial fluid collection. Vascular: No abnormal hyperdense vessel. Calcified atherosclerosis present at the skull base. Skull: Scalp soft tissues demonstrate no acute abnormality. Calvarium intact. Sinuses/Orbits: Globes and orbital soft tissues within normal limits. 2.2 cm ovoid expansile lesion seen adjacent to the left middle turbinate, which could reflect a mucocele or possibly polyp (series 3, image 26). Paranasal sinuses are otherwise largely clear. Trace chronic appearing right mastoid effusion noted. Other: None. IMPRESSION: 1. No acute intracranial abnormality. 2. Mild chronic microvascular ischemic disease with a few small remote cerebellar infarcts. 3. 2.2 cm ovoid expansile lesion adjacent to the left middle turbinate, which could reflect a mucocele or possibly polyp. Correlation with physical exam recommended. Electronically Signed   By: Morene Hoard M.D.   On: 04/22/2024 19:38   CT  CHEST WO CONTRAST Result Date: 04/22/2024 CLINICAL DATA:  Persistent hypoxia and dyspnea. EXAM: CT CHEST WITHOUT CONTRAST TECHNIQUE: Multidetector CT imaging of the chest was performed following the standard protocol without IV  contrast. RADIATION DOSE REDUCTION: This exam was performed according to the departmental dose-optimization program which includes automated exposure control, adjustment of the mA and/or kV according to patient size and/or use of iterative reconstruction technique. COMPARISON:  Chest radiographs dated 04/19/2024 and 04/16/2024. FINDINGS: Cardiovascular: Atheromatous calcifications, including the coronary arteries and aorta. Enlarged heart. No pericardial effusion. Post CABG changes. Left subclavian pacer and AICD leads. Mediastinum/Nodes: Small amount of high density material in the distal esophagus. Unremarkable trachea and thyroid gland. No enlarged lymph nodes. Lungs/Pleura: Small right pleural effusion. Areas of right lower lobe consolidation and volume loss. Mild patchy interstitial prominence in the superior segment right lower lobe, posterior right upper lobe and in the left lower lobe. There is also some patchy density in the lingula. Upper Abdomen: Elevated right hemidiaphragm. Air in the intrahepatic biliary tree. Atheromatous calcifications. Scattered colonic diverticula without evidence of diverticulitis. Musculoskeletal: Healing left posterolateral 9th rib fracture. Thoracic and lower cervical spine degenerative changes with ossification of the anterior longitudinal ligament in the mid and lower thoracic spine. IMPRESSION: 1. Areas of right lower lobe consolidation and volume loss, compatible with atelectasis and possible pneumonia. 2. Mild patchy interstitial prominence in the superior segment right lower lobe, posterior right upper lobe and in the left lower lobe. This could be due to pneumonia or mild pulmonary edema. 3. Patchy density in the lingula suspicious for pneumonia. 4. Small right pleural effusion. 5. Cardiomegaly. 6. Healing left posterolateral 9th rib fracture. 7. Calcific coronary artery and aortic atherosclerosis. 8. Air in the intrahepatic biliary tree, most likely due to previous  sphincterotomy. 9. Colonic diverticulosis. Aortic Atherosclerosis (ICD10-I70.0). Electronically Signed   By: Elspeth Bathe M.D.   On: 04/22/2024 15:50    Cardiac Studies  Echocardiogram: 03/2024 IMPRESSIONS     1. No LV thrombus by Definity . Left ventricular ejection fraction, by  estimation, is 20 to 25%. The left ventricle has severely decreased  function. The left ventricle demonstrates global hypokinesis. The left  ventricular internal cavity size was mildly  dilated. There is mild concentric left ventricular hypertrophy. Left  ventricular diastolic parameters are indeterminate. There is the  interventricular septum is flattened in systole and diastole, consistent  with right ventricular pressure and volume  overload.   2. Right ventricular systolic function is mildly reduced. The right  ventricular size is moderately enlarged. There is normal pulmonary artery  systolic pressure. The estimated right ventricular systolic pressure is  32.8 mmHg.   3. Left atrial size was mildly dilated.   4. S/p Sorin Memo 3D ring annuloplasty (size 30mm, Catalog #[id number], serial  [id number]). The mitral valve has been repaired/replaced. Mild mitral valve  regurgitation. No evidence of mitral stenosis. The mean mitral valve  gradient is 3.0 mmHg with average  heart rate of 85 bpm. Procedure Date: 10/17/11. Echo findings are  consistent with normal structure and function of the mitral valve  prosthesis.   5. The tricuspid valve is abnormal. Tricuspid valve regurgitation is  moderate.   6. The aortic valve is tricuspid. There is moderate calcification of the  aortic valve. There is moderate thickening of the aortic valve. Aortic  valve regurgitation is trivial. Aortic valve sclerosis/calcification is  present, without any evidence of  aortic stenosis.   7. The inferior vena cava is normal in size with <  50% respiratory  variability, suggesting right atrial pressure of 8 mmHg.    Patient Profile   89  y.o. male w/ PMH of  HFrEF/ICM (EF 20-25% in 2013 and similar results by repeat echo in 08/2017, VT and sinus node dysfunction (s/p ICD placement in 2013 with upgrade to [employer] Bi-V ICD in 09/2020), CAD (s/p CABG in 2013 with mitral valve repair), persistent atrial fibrillation/flutter (s/p DCCV in 03/2024), HTN, HLD, Type 2 DM and Stage 4 CKD who is currently admitted for an acute CHF exacerbation.    Assessment & Plan   1. Acute HFrEF - He has a longstanding cardiomyopathy and EF was at 20 to 25% by most recent imaging earlier this month with mildly reduced RV function. Presented with a 13 pound weight gain and BNP at 927. - He was initially receiving IV Lasix  and was transitioned to Torsemide  40 mg daily but diuretic therapy has been held since 04/21/2024 given worsening renal function. Weight has declined from a peak of 253 lbs to 248 lbs (was at 248 lbs during office visit on 04/11/2024). Will ask for a standing weight to be checked today. Net output of -6.0 L thus far. Renal function improving but not yet back to baseline (2.96 --> 2.91 --> 2.75). Can likely restart diuretics in the next 24-48 hours.   - In regard to GMDT, listed as taking Coreg  12.5mg  BID and Farxiga  10mg  daily but the past 3 doses of Coreg  have been held given hypotension. Will reduce Coreg  to 3.125mg  BID. Not an an ACE-I/ARB/ARNI/MRA given renal function.   2. CAD - Previously underwent CABG in 2013. He denies any recent anginal symptoms and Hs troponin values have been flat at 25 this admission which is most consistent with demand ischemia as compared to ACS. - He is not on ASA given the need for anticoagulation. Continue Coreg  with dose reduction. On Crestor  prior to admission but held given elevated CK at the time of admission. Would resume at discharge.  3. History of mitral valve repair - Echocardiogram earlier this month showed prior ring annuloplasty with mild MR and no evidence of mitral stenosis.  Overall, mitral  valve prosthesis was functioning normally.  4. Persistent Atrial Fibrillation/Flutter - He underwent DCCV on 04/01/2024 and by review of EP notes, was recommended be on Amiodarone  400 mg twice daily for 7 days followed by 200 mg twice daily for 7 days then 200 mg daily. However, he contacted the office later that month reporting worsening weakness and Dr. Waddell recommended stopping Amiodarone  on 04/05/2024. - Maintaining NSR this admission. Continue Coreg  but will reduce dosing as outlined above since recent doses have been held given soft BP.  - Continue Eiquis 2.5mg  BID which is the appropriate dose given his current age, weight and renal function.  5. PNA - CT this admission consistent with pneumonia and he has been started on Cefepime  and Doxycycline . Management per the admitting team.  6. Acute on Chronic Sage 4 CKD - Baseline creatinine 1.9 - 2.1. Peaked at 2.91 this admission and at 2.75 today.   For questions or updates, please contact [facility] Please consult [email or web address] for contact info under     Signed, Laymon CHRISTELLA Qua, PA-C  04/24/2024, 7:12 AM    Attending note  Patient seen and discussed with PA Qua, I agree with her documentation.  1.Acute on chronic combined systolic/diastolic HF - 09/2021 echo: LVE 25% - 03/2024 echo: LVEF 20-25%, indet diastolic function, D shaped septum, mild RV dysfunction -  presented with 13 lbs weight gain - CXR no acute process. BNP 927   - had been on IV lasix  80mg  tid, transitioned  to oral torsemide  40mg . Uptrend in Cr diuretics have been held. Cr remains elevated, continue to hold diuretics today.  - reds vest has been low quality -Weights appear inaccurate.  - medical therapy with coreg  12.5mg  bid, farxiga  10mg . In general medical therapy limited by renal dysfunction, soft bp's at times. Lower coreg  to 3.125mg  bid due to soft bp's - check standing weight when able, patient lethargic this AM hold for today.  - CT chest   with areas of RLL consolidation, possible pneumonia. Mild patch prominence pneumonia vs pulm edema, patchy density lingula concerning for pneumonia. Procalc elevated this admission    - essentially looks to be fairly well diuresed, lingering SOB looks to be more related to pneumonia. Restart diuretic pending renal function over next few days.      2. CAD with prior CABG - no acute issues this admission.      3. Persistent afib - prior DCCV 03/2024 - on coreg , eliquis .    4. AMS - per primary team - likely secondary to pneumonia, primary team has started antibiotics.     5. Pneumonia - - CT chest yesterday with areas of RLL consolidation, possible pneumonia. Mild patch prominence pneumonia vs pulm edema, patchy density lingula concerning for pneumonia. Procalc elevated this admission  - abx per primary team   Dorn Ross MD

## 2024-04-25 ENCOUNTER — Encounter (HOSPITAL_COMMUNITY)

## 2024-04-25 ENCOUNTER — Encounter: Payer: Self-pay | Admitting: Adult Health

## 2024-04-25 ENCOUNTER — Non-Acute Institutional Stay (SKILLED_NURSING_FACILITY): Payer: Self-pay | Admitting: Adult Health

## 2024-04-25 DIAGNOSIS — J181 Lobar pneumonia, unspecified organism: Secondary | ICD-10-CM

## 2024-04-25 DIAGNOSIS — I480 Paroxysmal atrial fibrillation: Secondary | ICD-10-CM

## 2024-04-25 DIAGNOSIS — E1122 Type 2 diabetes mellitus with diabetic chronic kidney disease: Secondary | ICD-10-CM

## 2024-04-25 DIAGNOSIS — Z515 Encounter for palliative care: Secondary | ICD-10-CM | POA: Diagnosis not present

## 2024-04-25 DIAGNOSIS — I5021 Acute systolic (congestive) heart failure: Secondary | ICD-10-CM | POA: Diagnosis not present

## 2024-04-25 DIAGNOSIS — I25700 Atherosclerosis of coronary artery bypass graft(s), unspecified, with unstable angina pectoris: Secondary | ICD-10-CM

## 2024-04-25 DIAGNOSIS — K219 Gastro-esophageal reflux disease without esophagitis: Secondary | ICD-10-CM

## 2024-04-25 DIAGNOSIS — J9601 Acute respiratory failure with hypoxia: Secondary | ICD-10-CM | POA: Diagnosis not present

## 2024-04-25 DIAGNOSIS — N184 Chronic kidney disease, stage 4 (severe): Secondary | ICD-10-CM

## 2024-04-25 DIAGNOSIS — I2510 Atherosclerotic heart disease of native coronary artery without angina pectoris: Secondary | ICD-10-CM | POA: Diagnosis not present

## 2024-04-25 DIAGNOSIS — I5043 Acute on chronic combined systolic (congestive) and diastolic (congestive) heart failure: Secondary | ICD-10-CM | POA: Diagnosis not present

## 2024-04-25 DIAGNOSIS — I5023 Acute on chronic systolic (congestive) heart failure: Secondary | ICD-10-CM

## 2024-04-25 DIAGNOSIS — I4819 Other persistent atrial fibrillation: Secondary | ICD-10-CM | POA: Diagnosis not present

## 2024-04-25 DIAGNOSIS — Z7189 Other specified counseling: Secondary | ICD-10-CM | POA: Diagnosis not present

## 2024-04-25 DIAGNOSIS — E1151 Type 2 diabetes mellitus with diabetic peripheral angiopathy without gangrene: Secondary | ICD-10-CM

## 2024-04-25 DIAGNOSIS — I442 Atrioventricular block, complete: Secondary | ICD-10-CM

## 2024-04-25 DIAGNOSIS — E1142 Type 2 diabetes mellitus with diabetic polyneuropathy: Secondary | ICD-10-CM

## 2024-04-25 DIAGNOSIS — I129 Hypertensive chronic kidney disease with stage 1 through stage 4 chronic kidney disease, or unspecified chronic kidney disease: Secondary | ICD-10-CM

## 2024-04-25 DIAGNOSIS — E785 Hyperlipidemia, unspecified: Secondary | ICD-10-CM

## 2024-04-25 DIAGNOSIS — E1169 Type 2 diabetes mellitus with other specified complication: Secondary | ICD-10-CM

## 2024-04-25 LAB — GLUCOSE, CAPILLARY
Glucose-Capillary: 125 mg/dL — ABNORMAL HIGH (ref 70–99)
Glucose-Capillary: 128 mg/dL — ABNORMAL HIGH (ref 70–99)
Glucose-Capillary: 159 mg/dL — ABNORMAL HIGH (ref 70–99)

## 2024-04-25 LAB — CBC
HCT: 39.5 % (ref 39.0–52.0)
Hemoglobin: 12.3 g/dL — ABNORMAL LOW (ref 13.0–17.0)
MCH: 29.2 pg (ref 26.0–34.0)
MCHC: 31.1 g/dL (ref 30.0–36.0)
MCV: 93.8 fL (ref 80.0–100.0)
Platelets: 234 K/uL (ref 150–400)
RBC: 4.21 MIL/uL — ABNORMAL LOW (ref 4.22–5.81)
RDW: 15.7 % — ABNORMAL HIGH (ref 11.5–15.5)
WBC: 8.5 K/uL (ref 4.0–10.5)
nRBC: 0 % (ref 0.0–0.2)

## 2024-04-25 LAB — BASIC METABOLIC PANEL WITH GFR
Anion gap: 8 (ref 5–15)
BUN: 54 mg/dL — ABNORMAL HIGH (ref 8–23)
CO2: 26 mmol/L (ref 22–32)
Calcium: 8 mg/dL — ABNORMAL LOW (ref 8.9–10.3)
Chloride: 103 mmol/L (ref 98–111)
Creatinine, Ser: 2.21 mg/dL — ABNORMAL HIGH (ref 0.61–1.24)
GFR, Estimated: 28 mL/min — ABNORMAL LOW (ref >60)
Glucose, Bld: 123 mg/dL — ABNORMAL HIGH (ref 70–99)
Potassium: 4 mmol/L (ref 3.5–5.1)
Sodium: 137 mmol/L (ref 135–145)

## 2024-04-25 LAB — MAGNESIUM: Magnesium: 2.5 mg/dL — ABNORMAL HIGH (ref 1.7–2.4)

## 2024-04-25 MED ORDER — TORSEMIDE 20 MG PO TABS
40.0000 mg | ORAL_TABLET | Freq: Every day | ORAL | Status: DC
  Administered 2024-04-25: 40 mg via ORAL
  Filled 2024-04-25: qty 2

## 2024-04-25 MED ORDER — CEFDINIR 300 MG PO CAPS: 300.0000 mg | ORAL_CAPSULE | Freq: Two times a day (BID) | ORAL | 0 refills | Status: AC

## 2024-04-25 MED ORDER — TORSEMIDE 40 MG PO TABS: 40.0000 mg | ORAL_TABLET | Freq: Every day | ORAL | 2 refills | Status: DC

## 2024-04-25 MED ORDER — CARVEDILOL 3.125 MG PO TABS: 3.1250 mg | ORAL_TABLET | Freq: Two times a day (BID) | ORAL | 2 refills | Status: DC

## 2024-04-25 MED ORDER — ZINC OXIDE 40 % EX OINT
TOPICAL_OINTMENT | CUTANEOUS | Status: DC | PRN
  Filled 2024-04-25: qty 57

## 2024-04-25 MED ORDER — DOXYCYCLINE HYCLATE 100 MG PO TABS: 100.0000 mg | ORAL_TABLET | Freq: Two times a day (BID) | ORAL | 0 refills | Status: AC

## 2024-04-25 MED ORDER — GUAIFENESIN 100 MG/5ML PO LIQD: 5.0000 mL | ORAL | 0 refills | Status: AC | PRN

## 2024-04-25 NOTE — Progress Notes (Signed)
 Location:  [facility] Room Number: [id number] Place of Service:  SNF (31)   CODE STATUS: dnr   Allergies  Allergen Reactions   Ace Inhibitors Swelling and Other (See Comments)    Kidney issues; angioedema per PMH in chart   Penicillins Palpitations    Chief Complaint  Patient presents with   Hospitalization Follow-up    HPI:  He is a 89 year old man who has been hospitalized from 04-16-24 through 04-25-24. His past medical history includes: heart failure CAD (CABG) (MVR repair 2013) ejection fraction 20-25%. Is status post icd/pacemaker placements; ckd stage 4; atrial fibrillation on eliquis  and type 2 diabetes mellitus. He had had a recent hospitalization on 04-01-24. He had been discharged on 40 mg lasix . His continued to climb from 234 pounds to his weight of 247 pounds. He had become increasingly weaker. He had been taken off amiodarone  on 8-825 due to reports of extremity weakness. The day of admission he was so weak that he was unable to get out of bed. He was treated for acute congestive heart failure and community acquired pneumonia. He is here for short term rehab with his goal to return back home. He will continue to be followed for his chronic illnesses including:  Paroxsymal atrial fibrillation:  Coronary artery disease involving coronary bypass graft of native heart with unstable angina:  Complete heart block:   Past Medical History:  Diagnosis Date   Allergy to ACE inhibitors    Angioedema many years ago; patient has tolerated Losartan  (ARB) in the past - it was held during admisson for GI bleed and worsening renal function >> resume Losartan  25 mg QD 06/2015   Arthritis    Atrial fibrillation (HCC)    PAF, CHADs2Vasc = 5   Carotid artery disease (HCC)    60-79% LICA   CHF (congestive heart failure) (HCC)    CKD (chronic kidney disease)    Coronary artery disease    Diabetes mellitus    GERD (gastroesophageal reflux disease)    Hyperlipidemia     Hypertension    ICD (implantable cardiac defibrillator) in place 03/28/2012   Biotronik, Dr. Waddell 03/28/12   Ischemic cardiomyopathy    S/P CABG x 3; EF 25%   Mitral regurgitation    S/P mitral valve repair 2013   Myocardial infarction [facility])    Renal insufficiency     Past Surgical History:  Procedure Laterality Date   BIV UPGRADE N/A 10/12/2020   Procedure: BIV ICD UPGRADE;  Surgeon: Waddell Danelle ORN, MD;  Location: [facility] INVASIVE CV LAB;  Service: Cardiovascular;  Laterality: N/A;   CARDIOVERSION N/A 04/01/2024   Procedure: CARDIOVERSION;  Surgeon: Lonni Slain, MD;  Location: [facility] INVASIVE CV LAB;  Service: Cardiovascular;  Laterality: N/A;   CHOLECYSTECTOMY N/A 09/20/2017   Procedure: LAPAROSCOPIC SUBTOTAL CHOLECYSTECTOMY;  Surgeon: Rubin Calamity, MD;  Location: [facility] OR;  Service: General;  Laterality: N/A;   COLONOSCOPY N/A 04/24/2014   RMR Pancolonic diverticulosis   CORONARY ARTERY BYPASS GRAFT  10/17/2011   Procedure: CORONARY ARTERY BYPASS GRAFTING (CABG);  Surgeon: Sudie VEAR Laine, MD;  Location: [facility] OR;  Service: Open Heart Surgery;  Laterality: N/A;   ERCP N/A 09/29/2017   Procedure: ENDOSCOPIC RETROGRADE CHOLANGIOPANCREATOGRAPHY (ERCP);  Surgeon: Rollin Dover, MD;  Location: [facility] ENDOSCOPY;  Service: Endoscopy;  Laterality: N/A;   ESOPHAGOGASTRODUODENOSCOPY N/A 04/24/2014   RMR Subtle nodularity the gastric mucosa of uncertain significance-status post biopsy. Hiatal hernia. chronic inflammation, no H.pylori   ESOPHAGOGASTRODUODENOSCOPY N/A  06/24/2016   Dr. Shaaron: normal esophagus, small hiatal hernia, normal duodenum   ICD  03/28/2012   IMPLANTABLE CARDIOVERTER DEFIBRILLATOR IMPLANT N/A 03/28/2012   Procedure: IMPLANTABLE CARDIOVERTER DEFIBRILLATOR IMPLANT;  Surgeon: Danelle LELON Birmingham, MD;  [facility] C[facility] LAB;  Service: Cardiovascular;  Laterality: N/A;   KNEE ARTHROSCOPY  ~ 2008   right   LEFT AND RIGHT HEART CATHETERIZATION WITH CORONARY ANGIOGRAM N/A 10/12/2011   Procedure:  LEFT AND RIGHT HEART CATHETERIZATION WITH CORONARY ANGIOGRAM;  Surgeon: Deatrice DELENA Cage, MD;  Location: [facility] CATH LAB;  Service: Cardiovascular;  Laterality: N/A;   MITRAL VALVE REPAIR  10/17/2011   Procedure: MITRAL VALVE REPAIR (MVR);  Surgeon: Sudie VEAR Laine, MD;  Location: [facility] OR;  Service: Open Heart Surgery;  Laterality: N/A;   NASAL SINUS SURGERY  1990's   right    Social History   Socioeconomic History   Marital status: Married    Spouse name: Not on file   Number of children: Not on file   Years of education: Not on file   Highest education level: Some college, no degree  Occupational History   Occupation: Retired  Tobacco Use   Smoking status: Never   Smokeless tobacco: Never  Vaping Use   Vaping status: Never Used  Substance and Sexual Activity   Alcohol use: No    Alcohol/week: 0.0 standard drinks of alcohol   Drug use: No   Sexual activity: Yes  Other Topics Concern   Not on file  Social History Narrative   Not on file   Social Drivers of Health   Financial Resource Strain: Medium Risk (09/05/2022)   Received from [facility]   Overall Financial Resource Strain (CARDIA)    Difficulty of Paying Living Expenses: Somewhat hard  Food Insecurity: No Food Insecurity (04/16/2024)   Hunger Vital Sign    Worried About Running Out of Food in the Last Year: Never true    Ran Out of Food in the Last Year: Never true  Transportation Needs: No Transportation Needs (04/16/2024)   PRAPARE - Administrator, Civil Service (Medical): No    Lack of Transportation (Non-Medical): No  Physical Activity: Unknown (09/05/2022)   Received from [facility]   Exercise Vital Sign    On average, how many days per week do you engage in moderate to strenuous exercise (like a brisk walk)?: Patient declined    Minutes of Exercise per Session: Not on file  Stress: No Stress Concern Present (09/05/2022)   Received from [facility] -  Occupational Stress Questionnaire    Feeling of Stress : Only a little  Social Connections: Socially Integrated (04/16/2024)   Social Connection and Isolation Panel    Frequency of Communication with Friends and Family: Twice a week    Frequency of Social Gatherings with Friends and Family: Twice a week    Attends Religious Services: 1 to 4 times per year    Active Member of [employer] or Organizations: No    Attends Engineer, structural: 1 to 4 times per year    Marital Status: Married  Recent Concern: Social Connections - Moderately Isolated (03/29/2024)   Social Connection and Isolation Panel    Frequency of Communication with Friends and Family: More than three times a week    Frequency of Social Gatherings with Friends and Family: Twice a week    Attends Religious Services: Never    Database administrator or Organizations:  No    Attends Club or Organization Meetings: Never    Marital Status: Married  Catering manager Violence: Not At Risk (04/16/2024)   Humiliation, Afraid, Rape, and Kick questionnaire    Fear of Current or Ex-Partner: No    Emotionally Abused: No    Physically Abused: No    Sexually Abused: No   Family History  Problem Relation Age of Onset   Heart disease Mother    Diabetes Father    Cardiomyopathy Father    Colon cancer Neg Hx       VITAL SIGNS BP 124/61   Pulse 60   Temp (!) 97.1 F (36.2 C)   Resp 16   Ht 6' 4 (1.93 m)   Wt 248 lb 3.2 oz (112.6 kg)   SpO2 100%   BMI 30.21 kg/m   Outpatient Encounter Medications as of 04/25/2024  Medication Sig   acetaminophen  (TYLENOL ) 650 MG CR tablet Take 1,300 mg by mouth every 8 (eight) hours as needed for pain.   carvedilol  (COREG ) 3.125 MG tablet Take 1 tablet (3.125 mg total) by mouth 2 (two) times daily with a meal.   cefdinir  (OMNICEF ) 300 MG capsule Take 1 capsule (300 mg total) by mouth 2 (two) times daily for 3 days.   dapagliflozin  propanediol (FARXIGA ) 10 MG TABS tablet Take 1 tablet (10 mg  total) by mouth daily.   diphenhydramine -acetaminophen  (TYLENOL  PM) 25-500 MG TABS tablet Take 1 tablet by mouth at bedtime.   doxycycline  (VIBRA -TABS) 100 MG tablet Take 1 tablet (100 mg total) by mouth every 12 (twelve) hours for 3 days.   ELIQUIS  2.5 MG TABS tablet TAKE (1) TABLET TWICE A DAY.    gabapentin  (NEURONTIN ) 100 MG capsule Take 300 mg by mouth at bedtime.    guaiFENesin  (ROBITUSSIN) 100 MG/5ML liquid Take 5 mLs by mouth every 4 (four) hours as needed for cough or to loosen phlegm.   HUMALOG  KWIKPEN 100 UNIT/ML KwikPen Inject 4-10 Units into the skin 2 (two) times daily. 4 units lunch, 10 units dinner   Insulin  Glargine (TOUJEO  SOLOSTAR) 300 UNIT/ML SOPN Inject 26 Units into the skin daily.   omeprazole  (PRILOSEC) 40 MG capsule Take 1 capsule (40 mg total) by mouth daily before breakfast.   rosuvastatin  (CRESTOR ) 5 MG tablet Take 5 mg by mouth daily.   [START ON 04/26/2024] torsemide  40 MG TABS Take 40 mg by mouth daily.      SIGNIFICANT DIAGNOSTIC EXAMS  LABS  04-16-24: wbc 10.8; hgb 13.1; hct 41.2; mcv 92.6 plt 206; glucose 130;bun 36; creat 2.75; k+ 3.4; na++ 136; ca 8.2; gfr 22; protein 6.1 albumin  3.2 04-18-24: wbc 11.1; hgb 12.8; hct 41.0; mcv 92.1 plt 172; glucose 160; bun 42; creat 2.68; k+ 4.2; an++ 138; ca 8.4 gfr 22; mag 2.4; vitamin B12: 1326; folate 19.7; tsh 4.368; free t4: 2.02 04-25-24: wbc 8.5; hgb 12.3; hct 39.5; mcv 93.8 plt 234; glucose 123; bun 54; creat 2.21; k+ 4.0; na++ 137; ca 8.0; gfr 28   Review of Systems  Constitutional:  Negative for malaise/fatigue.  Respiratory:  Negative for cough and shortness of breath.   Cardiovascular:  Negative for chest pain, palpitations and leg swelling.  Gastrointestinal:  Negative for abdominal pain, constipation and heartburn.  Musculoskeletal:  Negative for back pain, joint pain and myalgias.  Skin: Negative.   Neurological:  Negative for dizziness.  Psychiatric/Behavioral:  The patient is not nervous/anxious.      Physical Exam Constitutional:      General:  He is not in acute distress.    Appearance: He is well-developed. He is obese. He is not diaphoretic.  Neck:     Thyroid: No thyromegaly.  Cardiovascular:     Rate and Rhythm: Normal rate and regular rhythm.     Heart sounds: Normal heart sounds.  Pulmonary:     Effort: Pulmonary effort is normal. No respiratory distress.     Breath sounds: Normal breath sounds.  Abdominal:     General: Bowel sounds are normal. There is no distension.     Palpations: Abdomen is soft.     Tenderness: There is no abdominal tenderness.  Musculoskeletal:        General: Normal range of motion.     Cervical back: Neck supple.     Right lower leg: No edema.     Left lower leg: No edema.  Lymphadenopathy:     Cervical: No cervical adenopathy.  Skin:    General: Skin is warm and dry.  Neurological:     Mental Status: He is alert. Mental status is at baseline.  Psychiatric:        Mood and Affect: Mood normal.      ASSESSMENT/ PLAN:  TODAY  Lobular pneumonia: will complete omnicef  300 mg twice daily with doxycycline  100 mg twice daily for 3 days.   2. Acute respiratory failure with hypoxia: is presently stable.   3. Acute on chronic HFrEF (heart failure with reduced ejection fraction): will continue torsemide  40 mg daily   4. Paroxsymal atrial fibrillation: will continue eliquis  2.5 mg twice daily; coreg  3.215 mg twice daily for rate control.   5. Coronary artery disease involving coronary bypass graft of native heart with unstable angina: will continue coreg  3.125 mg twice daily   6. Complete heart block: is status post pace maker  7. Hypertension associated with stage 4 chronic kidney disease associated with type 2 diabetes mellitus: b/p 124/61: will continue coreg  3.125 mg twice daily   8. DM (diabetes mellitus) with peripheral vascular complications: will continue toujeo  26 units nightly; humalog  4 units with lunch; and 10 units with  dinner. Farxiga  10 mg daily   9. Gastroesophageal reflux disease without esophagitis: will continue prilosec 40 mg daily  10. Hyperlipidemia associated with type 2 diabetes mellitus: will continue crestor  5 mg daily   11. Diabetic polyneuropathy: will continue gabapentin  300 mg nightly  12. Stage 4 chronic kidney disease associated with type 2 diabetes mellitus: bun 54 creat 2.21; gfr 28    Barnie Seip NP [facility]   call [phone]

## 2024-04-25 NOTE — Progress Notes (Addendum)
 Rounding Note   Patient Name: Wayne Gordon Date of Encounter: 04/25/2024  [facility] Cardiologist: Vishnu P Mallipeddi, MD   Subjective:  Breathing continues to improve. No chest pain or palpitations. Still feels weak. Says he is planning to go to the [facility] for rehab following discharge.   Scheduled Meds:  apixaban   2.5 mg Oral BID   arformoterol   15 mcg Nebulization BID   budesonide  (PULMICORT ) nebulizer solution  0.5 mg Nebulization BID   carvedilol   3.125 mg Oral BID WC   dapagliflozin  propanediol  10 mg Oral QHS   doxycycline   100 mg Oral Q12H   insulin  aspart  0-9 Units Subcutaneous TID WC   insulin  glargine-yfgn  8 Units Subcutaneous QHS   pantoprazole   40 mg Oral QAC breakfast   polyethylene glycol  17 g Oral Daily   rosuvastatin   5 mg Oral QHS   senna  2 tablet Oral Daily   sodium chloride  flush  3 mL Intravenous Q12H   Continuous Infusions:  ceFEPime  (MAXIPIME ) IV 2 g (04/24/24 2043)   PRN Meds: acetaminophen , guaiFENesin , ondansetron  (ZOFRAN ) IV, sodium chloride  flush   Vital Signs  Vitals:   04/24/24 2026 04/25/24 0337 04/25/24 0419 04/25/24 0813  BP:  102/63    Pulse:  73    Resp:  16    Temp:  99.1 F (37.3 C)    TempSrc:  Oral    SpO2: 95% 97%  96%  Weight:   113 kg   Height:        Intake/Output Summary (Last 24 hours) at 04/25/2024 0833 Last data filed at 04/25/2024 0343 Gross per 24 hour  Intake 483 ml  Output 1100 ml  Net -617 ml      04/25/2024    4:19 AM 04/24/2024   10:13 AM 04/24/2024    4:30 AM  Last 3 Weights  Weight (lbs) 249 lb 1.9 oz 248 lb 6.4 oz 248 lb 0.3 oz  Weight (kg) 113 kg 112.674 kg 112.5 kg      Telemetry  AV paced, HR in the 80's.  - Personally Reviewed  Physical Exam  GEN: Pleasant, elderly male appearing in no acute distress.   Neck: No JVD Cardiac: RRR, no murmurs, rubs, or gallops.  Respiratory: Clear to auscultation bilaterally without wheezing or rales. GI: Soft, nontender,  non-distended  MS: 2+ pitting edema bilaterally; No deformity. Neuro:  Nonfocal  Psych: Normal affect   Labs High Sensitivity Troponin:   Recent Labs  Lab 03/29/24 1120 03/29/24 1302 04/16/24 1029 04/16/24 1155  TROPONINIHS 19* 19* 25* 25*     Chemistry Recent Labs  Lab 04/21/24 0322 04/22/24 0438 04/22/24 0954 04/22/24 1400 04/23/24 0418 04/24/24 0412 04/25/24 0411  NA 136   < >  --   --  137 139 137  K 4.8   < >  --    < > 4.6 4.4 4.0  CL 101   < >  --   --  100 101 103  CO2 26   < >  --   --  26 28 26   GLUCOSE 165*   < >  --   --  124* 125* 123*  BUN 49*   < >  --   --  64* 64* 54*  CREATININE 2.65*   < >  --   --  2.91* 2.75* 2.21*  CALCIUM  8.3*   < >  --   --  8.3* 8.3* 8.0*  MG 2.5*  --   --   --  2.5*  --  2.5*  PROT  --   --  6.2*  --   --   --   --   ALBUMIN   --   --  3.0*  --   --   --   --   AST  --   --  55*  --   --   --   --   ALT  --   --  44  --   --   --   --   ALKPHOS  --   --  94  --   --   --   --   BILITOT  --   --  2.2*  --   --   --   --   GFRNONAA 22*   < >  --   --  20* 22* 28*  ANIONGAP 9   < >  --   --  11 10 8    < > = values in this interval not displayed.    Lipids No results for input(s): CHOL, TRIG, HDL, LABVLDL, LDLCALC, CHOLHDL in the last 168 hours.  Hematology Recent Labs  Lab 04/23/24 0418 04/24/24 0412 04/25/24 0411  WBC 11.3* 9.6 8.5  RBC 4.17* 4.25 4.21*  HGB 12.0* 12.3* 12.3*  HCT 38.9* 39.8 39.5  MCV 93.3 93.6 93.8  MCH 28.8 28.9 29.2  MCHC 30.8 30.9 31.1  RDW 15.4 15.7* 15.7*  PLT 178 230 234   Thyroid No results for input(s): TSH, FREET4 in the last 168 hours.  BNP Recent Labs  Lab 04/24/24 0412  BNP 695.0*    DDimer No results for input(s): DDIMER in the last 168 hours.   Radiology  No results found.  Cardiac Studies  Echocardiogram: 03/2024 IMPRESSIONS     1. No LV thrombus by Definity . Left ventricular ejection fraction, by  estimation, is 20 to 25%. The left ventricle has  severely decreased  function. The left ventricle demonstrates global hypokinesis. The left  ventricular internal cavity size was mildly  dilated. There is mild concentric left ventricular hypertrophy. Left  ventricular diastolic parameters are indeterminate. There is the  interventricular septum is flattened in systole and diastole, consistent  with right ventricular pressure and volume  overload.   2. Right ventricular systolic function is mildly reduced. The right  ventricular size is moderately enlarged. There is normal pulmonary artery  systolic pressure. The estimated right ventricular systolic pressure is  32.8 mmHg.   3. Left atrial size was mildly dilated.   4. S/p Sorin Memo 3D ring annuloplasty (size 30mm, Catalog #[id number], serial  [id number]). The mitral valve has been repaired/replaced. Mild mitral valve  regurgitation. No evidence of mitral stenosis. The mean mitral valve  gradient is 3.0 mmHg with average  heart rate of 85 bpm. Procedure Date: 10/17/11. Echo findings are  consistent with normal structure and function of the mitral valve  prosthesis.   5. The tricuspid valve is abnormal. Tricuspid valve regurgitation is  moderate.   6. The aortic valve is tricuspid. There is moderate calcification of the  aortic valve. There is moderate thickening of the aortic valve. Aortic  valve regurgitation is trivial. Aortic valve sclerosis/calcification is  present, without any evidence of  aortic stenosis.   7. The inferior vena cava is normal in size with <50% respiratory  variability, suggesting right atrial pressure of 8 mmHg.   Patient Profile    89 y.o. male w/ PMH of  HFrEF/ICM (EF 20-25% in 2013 and  similar results by repeat echo in 08/2017, VT and sinus node dysfunction (s/p ICD placement in 2013 with upgrade to Biotronik Bi-V ICD in 09/2020), CAD (s/p CABG in 2013 with mitral valve repair), persistent atrial fibrillation/flutter (s/p DCCV in 03/2024), HTN, HLD, Type 2 DM and  Stage 4 CKD who is currently admitted for an acute CHF exacerbation.   Assessment & Plan    1. Acute HFrEF - He has a longstanding cardiomyopathy and EF was at 20 to 25% by most recent imaging earlier this month with mildly reduced RV function. Presented with a 13 pound weight gain and BNP at 927. - He was initially receiving IV Lasix  and was transitioned to Torsemide  40 mg daily but diuretic therapy has been held since 04/21/2024 given worsening renal function. Standing weight at 248 lbs yesterday and he was at 248 lbs during office visit on 04/11/2024. Net output of -6.5 L thus far. Renal function almost back to baseline with creatinine at 2.21. He was on Lasix  40mg  daily at home. Will review with Dr. Alvan in regards to either titrating to 60mg  daily or switching to Torsemide  as previously planned earlier this admission.  - In regard to GMDT, will continue Coreg  3.125mg  BID and Farxiga  10mg  daily. Not an an ACE-I/ARB/ARNI/MRA given renal function.    2. CAD - Previously underwent CABG in 2013. No recent anginal symptoms.  - No ASA given the need for anticoagulation. Continue Coreg  3.125mg  BID. On Crestor  prior to admission but held given elevated CK at the time of admission. Would resume at discharge.   3. History of mitral valve repair - Echocardiogram earlier this month showed prior ring annuloplasty with mild MR and no evidence of mitral stenosis.  Overall, mitral valve prosthesis was functioning normally.   4. Persistent Atrial Fibrillation/Flutter - He underwent DCCV on 04/01/2024 and was initially on Amiodarone  but contacted the office later that month reporting worsening weakness and Dr. Waddell recommended stopping Amiodarone  on 04/05/2024. - Maintaining NSR by review of telemetry. Continue Coreg  3.125mg  BID (dosing reduced yesterday due to hypotension and doses needing to be held). Can titrate as needed pending BP trend.  - Continue Eiquis 2.5mg  BID (reduced dose given creatinine > 1.5 and  age of 89 yo).    5. PNA - Remains on Cefepime  and Doxycycline . Defer management to the admitting team.    6. Acute on Chronic Sage 4 CKD - Baseline creatinine 1.9 - 2.1. Peaked at 2.91 this admission and improved to 2.21 today.   For questions or updates, please contact [facility] Please consult [email or web address] for contact info under     Signed, Laymon CHRISTELLA Qua, PA-C  04/25/2024, 8:33 AM    Attending note  Patient seen and discussed with PA Qua, I agree with her documentation  1.Acute on chronic combined systolic/diastolic HF - 09/2021 echo: LVE 25% - 03/2024 echo: LVEF 20-25%, indet diastolic function, D shaped septum, mild RV dysfunction - presented with 13 lbs weight gain - CXR no acute process. BNP 927   - had been on IV lasix  80mg  tid, transitioned  to oral torsemide  40mg . Uptrend in Cr diuretics have been held. - reds vest has been low quality -Cr improved today, would start oral torsemide  40mg  daily.  In general medical therapy limited by renal dysfunction, soft bp's at times. Lowered coreg  to 3.125mg  bid due to soft bp's.    - essentially looks to be fairly well diuresed, lingering SOB looks to be more related to pneumonia  2. CAD with prior CABG - no acute issues this admission.      3. Persistent afib - prior DCCV 03/2024 - on coreg , eliquis .    4. Pneumonia - - CT chest yesterday with areas of RLL consolidation, possible pneumonia. Mild patch prominence pneumonia vs pulm edema, patchy density lingula concerning for pneumonia. Procalc elevated this admission  - abx per primary team     No additional cardiology recs at this time, we will sign off inpatient care and arrange outpatient f/u   Dorn Ross MD

## 2024-04-25 NOTE — Progress Notes (Signed)
 Daily Progress Note   Patient Name: Wayne Gordon       Date: 04/25/2024 DOB: 09/01/1933  Age: 89 y.o. MRN#: [id number] Attending Physician: Maree Adron BIRCH, DO Primary Care Physician: Renato Dorothey HERO, NP Admit Date: 04/16/2024  Reason for Consultation/Follow-up: Disposition  Subjective:  89 y.o. male  with past medical history of HFrEF, CAD status post CABG and mitral valve repair 2013, LVEF 20-25%, status post ICD, pacer placement, stage IV CKD, persistent atrial fibrillation/atrial flutter on chronic anticoagulation with apixaban  status post DCCV 84/25, type 2 diabetes mellitus on insulin , with renal complications, hyperlipidemia, hypertension, coronary artery disease, status post mitral valve repair  admitted on 04/16/2024 with weight gain, increased weakness, and shortness of breath despite being compliant with home oral diuretics.    Patient was admitted for acute on chronic HFrEF.  Treated with IV diuresis.  Cardiology is following.  Hospital course was complicated by varying levels of alertness and now with new oxygen  requirement. Undergoing further workup. Cardiology has been following him as an outpatient and making adjustments to his medications to address symptoms as well. He is also followed by endocrinology for his T2DM. Underwent cardioversion 04/01/2024 and converted to NSR.     04/22/2024: Initial goals of care and advanced directive discussion completed.  Goals are to maintain function and have good quality of life.  Patient's CODE STATUS was updated to DNR/DNI, continue to treat the treatable and pursue further workup in hopes of having him discharged to SNF for rehab and ultimately return home with family.  04/23/2024: Follow-up visit, patient appears to be gradually improving.  Care/advance directive discussion completed with  patient.  He indicates his goal is to regain strength and ultimately return home.  He confirms advance directives are to be DNR/DNI/treat the treatable.  MOST form completed.   04/24/2024: Patient continues to improve clinically.  Vital signs and labs also appear to be improving.  Continues on oral and IV antibiotics.  Continues to be quite weak.  Plan continues to be DNR/DNI/treat the treatable and once stable, discharge to SNF  Today, lab work independently reviewed.  Renal function continues to improve with creatinine 2.21 today compared to 2.75 on 04/24/2024.  Peak this hospitalization was 2.96.  Baseline is 1.9-2.1.  White blood cell count remains normal.  Hemoglobin stable at 12.3.  Vital signs reviewed and also stable.  Continues to require supplemental O2.  No as needed symptom meds needed on 24-hour look back.  Plan appears to be to resume diuretic and monitor for a few additional days and then discharge to SNF.  Patient states he is doing  better.  He does report some soreness on his bottom and states that they applied a salve earlier that helped with the soreness.  Otherwise denies any pain.  Denies any shortness of breath but does have some with exertion.  Denies any nausea, vomiting, or difficulty sleeping.     Provided education to patient in person and wife via phone on the principles and objectives of palliative care, emphasizing its focus on symptom relief and improving quality of life. Reviewed criteria for outpatient services and offered a referral to support ongoing care needs.  Patient and wife agreeable to referral to outpatient palliative at discharge.  Spoke with patient and wife and provided updates regarding labs, vitals, and clinical status.  Confirmed plan is to discharge to [facility] for skilled rehab and ultimately return home.  Provided education to patient's wife regarding ongoing treatment of his pneumonia (shared he will be on to oral antibiotics until complete).  Also  discussed he will be started on torsemide  for volume management.  Patient and wife are looking forward to discharging from the hospital and getting 1 step closer to home.  Chart review/care coordination:  Completed extensive chart review including EPIC notes, labs, vital signs, medication administration record. Coordinated care with attending provider, bedside nursing staff, and TOC.  Also updated patient's wife via phone.   Length of Stay: 9   Physical Exam Constitutional:      General: He is not in acute distress.    Appearance: He is not toxic-appearing.  Pulmonary:     Effort: Pulmonary effort is normal. No respiratory distress.  Musculoskeletal:     Right lower leg: Edema present.     Left lower leg: Edema present.  Skin:    General: Skin is warm and dry.  Neurological:     Mental Status: He is alert.             Vital Signs: BP 128/66   Pulse 74   Temp 99.1 F (37.3 C) (Oral)   Resp 16   Ht 6' 6 (1.981 m)   Wt 113 kg   SpO2 96%   BMI 28.79 kg/m  SpO2: SpO2: 96 % O2 Device: O2 Device: Nasal Cannula O2 Flow Rate: O2 Flow Rate (L/min): 2.5 L/min      Palliative Assessment/Data: 60%   Palliative Care Assessment & Plan   Patient Profile/Assessment:  89 year old male admitted 04/16/2024 for weight gain, increased weakness, and shortness of breath diagnosed with acute on chronic HFrEF. Initially treated with IV diuresis. He did have a rise in his renal function and therefore IV diuresis has since been placed on hold. Patient noted to have varying levels of alertness and so further workup was pursued. Patient diagnosed with pneumonia and started on dual antibiotic therapy. Palliative team initial consult completed 04/22/2024. Mostly completed with patient's daughter and wife as he was unable to contribute much to history due to lethargy. Completed goals of care and advanced directive discussion. Goal is to go to the [facility] for rehab and get stronger and ultimately  return home. Quality of life is important for the patient. Patient was transition to DNR/DNI, treat the treatable. Followed up with patient again on 04/23/2024 and he confirms goals of care and advanced directives. MOST and goldenrod have been completed. Seen daily and continues to make improvements in clinical status and as evidenced by objective data.  Plan is to resume diuretics today, transition to oral antibiotics, and discharge to SNF.  Provided education on and offered  referral to outpatient palliative care.  Patient and wife are interested in having palliative follow outpatient.  Will place order.  Recommendations/Plan:  DNR/DNI, treat the treatable Discharge to SNF with outpatient palliative care follow-up (TOC informed of need for referral) Barrier cream ordered for soreness to coccyx and for skin breakdown prevention Inpatient PMT will sign off as patient is discharging   Symptom management:  Barrier cream for soreness to coccyx otherwise, stable at present so continue current symptom regimen per admitting team with PMT available for support as needed  Prognosis:  Unable to determine    Discharge Planning: Skilled Nursing Facility for rehab with Palliative care service follow-up    Detailed review of medical records (labs, imaging, vital signs), medically appropriate exam, discussed with treatment team, counseling and education to patient, family, & staff, documenting clinical information, medication management, coordination of care   Total time: I spent 35 minutes in the care of the patient today in the above activities and documenting the encounter.         Laymon CHRISTELLA Pinal, NP  Palliative Medicine Team Team phone # [phone]  Thank you for allowing the Palliative Medicine Team to assist in the care of this patient. Please utilize secure chat with additional questions, if there is no response within 30 minutes please call the above phone number.  Palliative  Medicine Team providers are available by phone from 7am to 7pm daily and can be reached through the team cell phone.  Should this patient require assistance outside of these hours, please call the patient's attending physician.

## 2024-04-25 NOTE — Discharge Summary (Signed)
 Physician Discharge Summary  Wayne Gordon FMW:[id number] DOB: 11/18/1933 DOA: 04/16/2024  PCP: Renato Dorothey HERO, NP  Admit date: 04/16/2024  Discharge date: 04/25/2024  Admitted From:Home  Disposition:  SNF  Recommendations for Outpatient Follow-up:  Follow up with PCP in 1-2 weeks Follow-up with cardiology as scheduled on 9/2 at 3:30 PM Continue on torsemide  40 mg daily Coreg  3.125 mg twice daily Continue other home medications as prior Complete course of antibiotics with doxycycline  and Omnicef  for 3 more days as prescribed for pneumonia  Home Health: None  Equipment/Devices: None  Discharge Condition:Stable  CODE STATUS: DNR  Diet recommendation: Heart Healthy/carb modified  Brief/Interim Summary: 89 year old male with HFrEF, CAD status post CABG and mitral valve repair 2013, LVEF 20-25%, status post ICD, pacer placement, stage IV CKD, persistent atrial fibrillation/atrial flutter on chronic anticoagulation with apixaban  status post DCCV 84/25, ype 2 diabetes mellitus on insulin , with renal complications, hyperlipidemia, hypertension, coronary artery disease, status post mitral valve repair, recent hospitalizations, recently discharged 04/01/2024 with a discharge weight of 234 pounds at that time.  EP had performed a cardioversion on 8/4 for symptomatic atrial flutter.  The patient was admitted to the hospital from 03/29/2024 to 04/01/2024 for acute on chronic HFrEF.   He was discharged home on furosemide  40 mg p.o. daily.  Since hospital discharge, his weight has steadily climbed despite taking oral diuretics at home.  He is now up to 247 pounds.  He is increasingly becoming weaker.  He saw cardiology in the outpatient setting recently and was taken off amiodarone  04/05/24 due to patient report of arm and leg weakness.  He was brought into the emergency department because he continued to have swelling and weight gain in his legs and worsening shortness of breath.  He complained of severe  fatigue and weakness and was unable to get out of bed this morning because he was so weak.  He was advised by the cardiology office to call EMS and come to the ED.  He was noted to be severely deconditioned and frail with significant peripheral edema, elevated BNP of 681, troponin 25, potassium 3.4, creatinine 2.75 and request was made for inpatient hospitalization for IV diuresis as he is failing outpatient diuresis with oral medication in part due to advanced renal failure.   The patient is showing some initial clinical improvement.  Unfortunately, he continued to remain severely deconditioned.  Although his I's and O's were incomplete, he did show ample diuresis.  Unfortunately, he developed acute on chronic renal failure in part due to cardiorenal syndrome.  He developed new oxygen  requirement on 2 L.  He was somewhat somnolent throughout the hospitalization but aroused and follow commands.  On 04/22/2024, the patient was more somnolent.  Further workup was initiated.  His diuretics were placed on hold secondary to uptrend of his serum creatinine.  Palliative medicine was consulted to initiate goals of care discussions and patient is DNR.  His creatinine levels are now downward trending and cardiology is recommending continuation with diuresis with oral torsemide  40 mg daily.  He will have outpatient follow-up with cardiology on 9/2 and is stable for discharge to SNF today.  No other acute events or concerns noted.  Discharge Diagnoses:  Principal Problem:   Acute HFrEF (heart failure with reduced ejection fraction) (HCC) Active Problems:   S/P mitral valve repair   S/P CABG x 3   Long term (current) use of anticoagulants   Ischemic cardiomyopathy   HTN (hypertension)   S/P MVR (mitral  valve repair)   Automatic implantable cardioverter-defibrillator in situ   Hyperlipidemia   GERD (gastroesophageal reflux disease)   Gonadotropin deficiency (HCC)   Atrial fibrillation (HCC)   Ventricular  arrhythmia   Type 2 diabetes mellitus without complication (HCC)   Acute respiratory failure with hypoxia (HCC)   History of implantable cardioverter-defibrillator (ICD) placement   Atrial flutter (HCC)   Chronic kidney disease (CKD), stage IV (severe) (HCC)   Abnormal weight gain   Hypokalemia   Acute renal failure superimposed on stage 4 chronic kidney disease (HCC)   Acute on chronic HFrEF (heart failure with reduced ejection fraction) (HCC)   Acute metabolic encephalopathy   Lobar pneumonia (HCC)  Principal discharge diagnosis: Acute hypoxemic respiratory failure-multifactorial with acute on chronic HFrEF as well as community-acquired pneumonia.  AKI on CKD stage IV-improved.  Discharge Instructions  Discharge Instructions     Diet - low sodium heart healthy   Complete by: As directed    Increase activity slowly   Complete by: As directed       Allergies as of 04/25/2024       Reactions   Ace Inhibitors Swelling, Other (See Comments)   Kidney issues; angioedema per PMH in chart   Penicillins Palpitations        Medication List     STOP taking these medications    amiodarone  200 MG tablet Commonly known as: PACERONE    furosemide  40 MG tablet Commonly known as: LASIX        TAKE these medications    acetaminophen  650 MG CR tablet Commonly known as: TYLENOL  Take 1,300 mg by mouth every 8 (eight) hours as needed for pain.   carvedilol  3.125 MG tablet Commonly known as: COREG  Take 1 tablet (3.125 mg total) by mouth 2 (two) times daily with a meal. What changed:  medication strength how much to take   cefdinir  300 MG capsule Commonly known as: OMNICEF  Take 1 capsule (300 mg total) by mouth 2 (two) times daily for 3 days.   dapagliflozin  propanediol 10 MG Tabs tablet Commonly known as: FARXIGA  Take 1 tablet (10 mg total) by mouth daily.   diphenhydramine -acetaminophen  25-500 MG Tabs tablet Commonly known as: TYLENOL  PM Take 1 tablet by mouth at  bedtime.   doxycycline  100 MG tablet Commonly known as: VIBRA -TABS Take 1 tablet (100 mg total) by mouth every 12 (twelve) hours for 3 days.   Eliquis  2.5 MG Tabs tablet Generic drug: apixaban  TAKE (1) TABLET TWICE A DAY. What changed: See the new instructions.   gabapentin  100 MG capsule Commonly known as: NEURONTIN  Take 200-300 mg by mouth at bedtime. Takes an extra capsule if struggling to sleep.   guaiFENesin  100 MG/5ML liquid Commonly known as: ROBITUSSIN Take 5 mLs by mouth every 4 (four) hours as needed for cough or to loosen phlegm.   HumaLOG  KwikPen 100 UNIT/ML KwikPen Generic drug: insulin  lispro Inject 4-10 Units into the skin 2 (two) times daily. 4 units lunch, 10 units dinner   omeprazole  40 MG capsule Commonly known as: PRILOSEC Take 1 capsule (40 mg total) by mouth daily before breakfast.   rosuvastatin  5 MG tablet Commonly known as: CRESTOR  Take 5 mg by mouth daily.   Torsemide  40 MG Tabs Take 40 mg by mouth daily. Start taking on: April 26, 2024   Toujeo  SoloStar 300 UNIT/ML Solostar Pen Generic drug: insulin  glargine (1 Unit Dial ) Inject 26 Units into the skin daily.        Follow-up Information  Care, [facility] Follow up.   Specialty: Home Health Services Why: Agency will call to set up first home visit. Contact information: [address] [place] [address] [phone]         [facility]. Go on 04/30/2024.   Specialty: Cardiology Why: Hospital Follow-Up 04/30/24 @ 3:30 PM [facility], [place] Entr[address] off [address]-( [facility]) Please bring all mediations with you to the appointment Free Valet Parking at the door or use GATE CODE [id number] to park under the building. Contact information: [address] [place]  [address] [phone]        Miriam Norris, NP Follow up on 05/23/2024.   Specialty:  Cardiology Why: Keep follow-up for 05/23/2024 at 4:00 PM. Contact information: [address] [place] [address] [phone]         Renato Dorothey HERO, NP. Schedule an appointment as soon as possible for a visit in 1 week(s).   Specialty: Internal Medicine Contact information: [address] [place]  [address] [place] [address] [phone]                Allergies  Allergen Reactions   Ace Inhibitors Swelling and Other (See Comments)    Kidney issues; angioedema per PMH in chart   Penicillins Palpitations    Consultations: Cardiology   Procedures/Studies: CT HEAD WO CONTRAST ( ) Result Date: 04/22/2024 CLINICAL DATA:  Initial evaluation for mental status change, unknown cause. EXAM: CT HEAD WITHOUT CONTRAST TECHNIQUE: Contiguous axial images were obtained from the base of the skull through the vertex without intravenous contrast. RADIATION DOSE REDUCTION: This exam was performed according to the departmental dose-optimization program which includes automated exposure control, adjustment of the mA and/or kV according to patient size and/or use of iterative reconstruction technique. COMPARISON:  CT from 07/10/2015. FINDINGS: Brain: Cerebral volume within normal limits. Mild chronic microvascular ischemic disease for age. Few small remote cerebellar infarcts noted bilaterally. No acute intracranial hemorrhage. No acute large vessel territory infarct. No mass lesion, midline shift or mass effect. No hydrocephalus or extra-axial fluid collection. Vascular: No abnormal hyperdense vessel. Calcified atherosclerosis present at the skull base. Skull: Scalp soft tissues demonstrate no acute abnormality. Calvarium intact. Sinuses/Orbits: Globes and orbital soft tissues within normal limits. 2.2 cm ovoid expansile lesion seen adjacent to the left middle turbinate, which could reflect a mucocele or possibly polyp (series 3, image 26). Paranasal sinuses are otherwise largely clear. Trace chronic  appearing right mastoid effusion noted. Other: None. IMPRESSION: 1. No acute intracranial abnormality. 2. Mild chronic microvascular ischemic disease with a few small remote cerebellar infarcts. 3. 2.2 cm ovoid expansile lesion adjacent to the left middle turbinate, which could reflect a mucocele or possibly polyp. Correlation with physical exam recommended. Electronically Signed   By: Morene Hoard M.D.   On: 04/22/2024 19:38   CT CHEST WO CONTRAST Result Date: 04/22/2024 CLINICAL DATA:  Persistent hypoxia and dyspnea. EXAM: CT CHEST WITHOUT CONTRAST TECHNIQUE: Multidetector CT imaging of the chest was performed following the standard protocol without IV contrast. RADIATION DOSE REDUCTION: This exam was performed according to the departmental dose-optimization program which includes automated exposure control, adjustment of the mA and/or kV according to patient size and/or use of iterative reconstruction technique. COMPARISON:  Chest radiographs dated 04/19/2024 and 04/16/2024. FINDINGS: Cardiovascular: Atheromatous calcifications, including the coronary arteries and aorta. Enlarged heart. No pericardial effusion. Post CABG changes. Left subclavian pacer and  AICD leads. Mediastinum/Nodes: Small amount of high density material in the distal esophagus. Unremarkable trachea and thyroid gland. No enlarged lymph nodes. Lungs/Pleura: Small right pleural effusion. Areas of right lower lobe consolidation and volume loss. Mild patchy interstitial prominence in the superior segment right lower lobe, posterior right upper lobe and in the left lower lobe. There is also some patchy density in the lingula. Upper Abdomen: Elevated right hemidiaphragm. Air in the intrahepatic biliary tree. Atheromatous calcifications. Scattered colonic diverticula without evidence of diverticulitis. Musculoskeletal: Healing left posterolateral 9th rib fracture. Thoracic and lower cervical spine degenerative changes with ossification of  the anterior longitudinal ligament in the mid and lower thoracic spine. IMPRESSION: 1. Areas of right lower lobe consolidation and volume loss, compatible with atelectasis and possible pneumonia. 2. Mild patchy interstitial prominence in the superior segment right lower lobe, posterior right upper lobe and in the left lower lobe. This could be due to pneumonia or mild pulmonary edema. 3. Patchy density in the lingula suspicious for pneumonia. 4. Small right pleural effusion. 5. Cardiomegaly. 6. Healing left posterolateral 9th rib fracture. 7. Calcific coronary artery and aortic atherosclerosis. 8. Air in the intrahepatic biliary tree, most likely due to previous sphincterotomy. 9. Colonic diverticulosis. Aortic Atherosclerosis (ICD10-I70.0). Electronically Signed   By: Elspeth Bathe M.D.   On: 04/22/2024 15:50   DG CHEST PORT 1 VIEW Result Date: 04/19/2024 CLINICAL DATA:  [id number] Acute respiratory failure with hypoxia [facility]) [id number] Pulmonary edema [id number] EXAM: PORTABLE CHEST - 1 VIEW COMPARISON:  April 16, 2024, March 29, 2024 FINDINGS: Elevation of the right hemidiaphragm with lower lung volumes on the right. The most inferior aspect of the left lung base are excluded from the field of view. Retrocardiac airspace opacities noted. No pneumothorax. Moderate cardiomegaly. Sternotomy wires and CABG markers. Left chest pacemaker with partially visualized leads in the region of the right atrium and right ventricle. Aortic atherosclerosis. No acute fracture or destructive lesions. Multilevel thoracic osteophytosis. IMPRESSION: The most inferior aspect of the left lung base and costophrenic sulcus are excluded from the field of view. Retrocardiac airspace opacities, may reflect atelectasis, bronchopneumonia, or asymmetric pulmonary edema. Electronically Signed   By: Rogelia Myers M.D.   On: 04/19/2024 17:22   DG Chest Portable 1 View Result Date: 04/16/2024 CLINICAL DATA:  Weakness. EXAM: PORTABLE CHEST 1 VIEW  COMPARISON:  March 29, 2024. FINDINGS: Stable cardiomegaly. Sternotomy wires are noted. Left-sided defibrillator is unchanged. Elevated right hemidiaphragm is noted. Left lung base is not completely included in field-of-view. Visualized lung parenchyma is unremarkable. Bony thorax is unremarkable. IMPRESSION: Left lung base is not completely included in field-of-view. No definite abnormality seen in the visualized lung parenchyma. Electronically Signed   By: Lynwood Landy Raddle M.D.   On: 04/16/2024 12:06   EP STUDY Result Date: 04/01/2024 See surgical note for result.  ECHOCARDIOGRAM COMPLETE Result Date: 03/30/2024    ECHOCARDIOGRAM REPORT   Patient Name:   ELIZEO RODRIQUES Date of Exam: 03/30/2024 Medical Rec #:  [id number]      Height:       76.0 in Accession #:    [id number]     Weight:       242.9 lb Date of Birth:  [birth year]       BSA:          2.406 m Patient Age:    89 years       BP:           106/66 mmHg Patient Gender: M  HR:           80 bpm. Exam Location:  Inpatient Procedure: 2D Echo, Cardiac Doppler, Color Doppler and Intracardiac            Opacification Agent (Both Spectral and Color Flow Doppler were            utilized during procedure). Indications:    I50.21 Acute systolic (congestive) heart failure  History:        Patient has prior history of Echocardiogram examinations, most                 recent 10/15/2021. CHF, CAD, Prior CABG and Defibrillator,                 Arrythmias:Atrial Fibrillation; Risk Factors:Hypertension and                 Diabetes.  Sonographer:    Damien Senior RDCS Referring Phys: [phone] JACOB J STINSON IMPRESSIONS  1. No LV thrombus by Definity . Left ventricular ejection fraction, by estimation, is 20 to 25%. The left ventricle has severely decreased function. The left ventricle demonstrates global hypokinesis. The left ventricular internal cavity size was mildly dilated. There is mild concentric left ventricular hypertrophy. Left ventricular diastolic parameters  are indeterminate. There is the interventricular septum is flattened in systole and diastole, consistent with right ventricular pressure and volume overload.  2. Right ventricular systolic function is mildly reduced. The right ventricular size is moderately enlarged. There is normal pulmonary artery systolic pressure. The estimated right ventricular systolic pressure is 32.8 mmHg.  3. Left atrial size was mildly dilated.  4. S/p Sorin Memo 3D ring annuloplasty (size 30mm, Catalog #[id number], serial [id number]). The mitral valve has been repaired/replaced. Mild mitral valve regurgitation. No evidence of mitral stenosis. The mean mitral valve gradient is 3.0 mmHg with average heart rate of 85 bpm. Procedure Date: 10/17/11. Echo findings are consistent with normal structure and function of the mitral valve prosthesis.  5. The tricuspid valve is abnormal. Tricuspid valve regurgitation is moderate.  6. The aortic valve is tricuspid. There is moderate calcification of the aortic valve. There is moderate thickening of the aortic valve. Aortic valve regurgitation is trivial. Aortic valve sclerosis/calcification is present, without any evidence of aortic stenosis.  7. The inferior vena cava is normal in size with <50% respiratory variability, suggesting right atrial pressure of 8 mmHg. FINDINGS  Left Ventricle: No LV thrombus by Definity . Left ventricular ejection fraction, by estimation, is 20 to 25%. The left ventricle has severely decreased function. The left ventricle demonstrates global hypokinesis. Definity  contrast agent was given IV to delineate the left ventricular endocardial borders. Strain was performed and the global longitudinal strain is indeterminate. The left ventricular internal cavity size was mildly dilated. There is mild concentric left ventricular hypertrophy. The interventricular septum is flattened in systole and diastole, consistent with right ventricular pressure and volume overload. Left ventricular  diastolic function could not be evaluated due to mitral valve repair. Left ventricular diastolic parameters are  indeterminate. Right Ventricle: The right ventricular size is moderately enlarged. No increase in right ventricular wall thickness. Right ventricular systolic function is mildly reduced. There is normal pulmonary artery systolic pressure. The tricuspid regurgitant velocity is 2.49 m/s, and with an assumed right atrial pressure of 8 mmHg, the estimated right ventricular systolic pressure is 32.8 mmHg. Left Atrium: Left atrial size was mildly dilated. Right Atrium: Right atrial size was normal in size. Pericardium: There is no evidence of pericardial effusion. Mitral  Valve: S/p Sorin Memo 3D ring annuloplasty (size 30mm, Catalog #[id number], serial [id number]). The mitral valve has been repaired/replaced. Mild mitral valve regurgitation. There is a 30 mm Mitral Memo ring prosthetic annuloplasty ring present in the mitral position. Echo findings are consistent with normal structure and function of the mitral valve prosthesis. No evidence of mitral valve stenosis. MV peak gradient, 6.6 mmHg. The mean mitral valve gradient is 3.0 mmHg with average heart rate of 85 bpm. Tricuspid Valve: The tricuspid valve is abnormal. Tricuspid valve regurgitation is moderate. Aortic Valve: The aortic valve is tricuspid. There is moderate calcification of the aortic valve. There is moderate thickening of the aortic valve. Aortic valve regurgitation is trivial. Aortic valve sclerosis/calcification is present, without any evidence of aortic stenosis. Pulmonic Valve: The pulmonic valve was not well visualized. Pulmonic valve regurgitation is trivial. Aorta: The aortic root and ascending aorta are structurally normal, with no evidence of dilitation. Venous: The inferior vena cava is normal in size with less than 50% respiratory variability, suggesting right atrial pressure of 8 mmHg. IAS/Shunts: No atrial level shunt detected by color  flow Doppler. Additional Comments: 3D was performed not requiring image post processing on an independent workstation and was indeterminate. A device lead is visualized in the right atrium and right ventricle.  LEFT VENTRICLE PLAX 2D LVIDd:         5.10 cm LVIDs:         4.80 cm LV PW:         1.20 cm LV IVS:        1.20 cm LVOT diam:     2.00 cm LV SV:         33 LV SV Index:   14 LVOT Area:     3.14 cm  LEFT ATRIUM              Index        RIGHT ATRIUM           Index LA diam:        4.60 cm  1.91 cm/m   RA Area:     18.60 cm LA Vol (A2C):   107.0 ml 44.47 ml/m  RA Volume:   51.50 ml  21.40 ml/m LA Vol (A4C):   79.8 ml  33.17 ml/m LA Biplane Vol: 93.3 ml  38.78 ml/m  AORTIC VALVE LVOT Vmax:   54.30 cm/s LVOT Vmean:  37.400 cm/s LVOT VTI:    0.104 m  AORTA Ao Root diam: 3.30 cm Ao Asc diam:  3.70 cm MITRAL VALVE             TRICUSPID VALVE MV Area VTI:  1.34 cm   TR Peak grad:   24.8 mmHg MV Peak grad: 6.6 mmHg   TR Vmax:        249.00 cm/s MV Mean grad: 3.0 mmHg MV Vmax:      1.29 m/s   SHUNTS MV Vmean:     80.7 cm/s  Systemic VTI:  0.10 m                          Systemic Diam: 2.00 cm Vishnu Priya Mallipeddi Electronically signed by Diannah Late Mallipeddi Signature Date/Time: 03/30/2024/11:31:43 AM    Final    DG Chest Portable 1 View Result Date: 03/29/2024 CLINICAL DATA:  Weakness.  Dizziness. EXAM: PORTABLE CHEST 1 VIEW COMPARISON:  10/15/2021. FINDINGS: Low lung volume. Mild diffuse pulmonary vascular congestion, likely accentuated by low  lung volume. No frank pulmonary edema. There are probable atelectatic changes at the lung bases. There is subtle blunting of bilateral lateral costophrenic angles, which may be due to trace pleural effusions versus pleural thickening. No pneumothorax on either side. Stable cardio-mediastinal silhouette. There are surgical staples along the heart border and sternotomy wires, status post CABG (coronary artery bypass graft). There is a left sided 3-lead pacemaker.  Neurostimulator device noted overlying the lower thoracic spine region. No acute osseous abnormalities. The soft tissues are within normal limits. IMPRESSION: Mild diffuse pulmonary vascular congestion, likely accentuated by low lung volume. No frank pulmonary edema. Electronically Signed   By: Ree Molt M.D.   On: 03/29/2024 12:09     Discharge Exam: Vitals:   04/25/24 0813 04/25/24 0834  BP:  128/66  Pulse:  74  Resp:    Temp:    SpO2: 96%    Vitals:   04/25/24 0337 04/25/24 0419 04/25/24 0813 04/25/24 0834  BP: 102/63   128/66  Pulse: 73   74  Resp: 16     Temp: 99.1 F (37.3 C)     TempSrc: Oral     SpO2: 97%  96%   Weight:  113 kg    Height:        General: Pt is alert, awake, not in acute distress Cardiovascular: RRR, S1/S2 +, no rubs, no gallops Respiratory: CTA bilaterally, no wheezing, no rhonchi Abdominal: Soft, NT, ND, bowel sounds + Extremities: Bilateral 2+ edema    The results of significant diagnostics from this hospitalization (including imaging, microbiology, ancillary and laboratory) are listed below for reference.     Microbiology: Recent Results (from the past 240 hours)  Resp panel by RT-PCR (RSV, Flu A&B, Covid) Anterior Nasal Swab     Status: None   Collection Time: 04/16/24 11:04 AM   Specimen: Anterior Nasal Swab  Result Value Ref Range Status   SARS Coronavirus 2 by RT PCR NEGATIVE NEGATIVE Final    Comment: (NOTE) SARS-CoV-2 target nucleic acids are NOT DETECTED.  The SARS-CoV-2 RNA is generally detectable in upper respiratory specimens during the acute phase of infection. The lowest concentration of SARS-CoV-2 viral copies this assay can detect is 138 copies/mL. A negative result does not preclude SARS-Cov-2 infection and should not be used as the sole basis for treatment or other patient management decisions. A negative result may occur with  improper specimen collection/handling, submission of specimen other than nasopharyngeal  swab, presence of viral mutation(s) within the areas targeted by this assay, and inadequate number of viral copies(<138 copies/mL). A negative result must be combined with clinical observations, patient history, and epidemiological information. The expected result is Negative.  Fact Sheet for Patients:  BloggerCourse.com  Fact Sheet for Healthcare Providers:  SeriousBroker.it  This test is no t yet approved or cleared by the [place]  FDA and  has been authorized for detection and/or diagnosis of SARS-CoV-2 by FDA under an Emergency Use Authorization (EUA). This EUA will remain  in effect (meaning this test can be used) for the duration of the COVID-19 declaration under Section 564(b)(1) of the Act, 21 U.S.C.section 360bbb-3(b)(1), unless the authorization is terminated  or revoked sooner.       Influenza A by PCR NEGATIVE NEGATIVE Final   Influenza B by PCR NEGATIVE NEGATIVE Final    Comment: (NOTE) The Xpert Xpress SARS-CoV-2/FLU/RSV plus assay is intended as an aid in the diagnosis of influenza from Nasopharyngeal swab specimens and should not be used as a  sole basis for treatment. Nasal washings and aspirates are unacceptable for Xpert Xpress SARS-CoV-2/FLU/RSV testing.  Fact Sheet for Patients: BloggerCourse.com  Fact Sheet for Healthcare Providers: SeriousBroker.it  This test is not yet approved or cleared by the [place]  FDA and has been authorized for detection and/or diagnosis of SARS-CoV-2 by FDA under an Emergency Use Authorization (EUA). This EUA will remain in effect (meaning this test can be used) for the duration of the COVID-19 declaration under Section 564(b)(1) of the Act, 21 U.S.C. section 360bbb-3(b)(1), unless the authorization is terminated or revoked.     Resp Syncytial Virus by PCR NEGATIVE NEGATIVE Final    Comment: (NOTE) Fact Sheet for  Patients: BloggerCourse.com  Fact Sheet for Healthcare Providers: SeriousBroker.it  This test is not yet approved or cleared by the [place]  FDA and has been authorized for detection and/or diagnosis of SARS-CoV-2 by FDA under an Emergency Use Authorization (EUA). This EUA will remain in effect (meaning this test can be used) for the duration of the COVID-19 declaration under Section 564(b)(1) of the Act, 21 U.S.C. section 360bbb-3(b)(1), unless the authorization is terminated or revoked.  Performed at [facility], [address], [place], [place] [address]   Respiratory (~20 pathogens) panel by PCR     Status: None   Collection Time: 04/18/24  4:55 PM   Specimen: Nasopharyngeal Swab; Respiratory  Result Value Ref Range Status   Adenovirus NOT DETECTED NOT DETECTED Final   Coronavirus 229E NOT DETECTED NOT DETECTED Final    Comment: (NOTE) The Coronavirus on the Respiratory Panel, DOES NOT test for the novel  Coronavirus (2019 nCoV)    Coronavirus HKU1 NOT DETECTED NOT DETECTED Final   Coronavirus NL63 NOT DETECTED NOT DETECTED Final   Coronavirus OC43 NOT DETECTED NOT DETECTED Final   Metapneumovirus NOT DETECTED NOT DETECTED Final   Rhinovirus / Enterovirus NOT DETECTED NOT DETECTED Final   Influenza A NOT DETECTED NOT DETECTED Final   Influenza B NOT DETECTED NOT DETECTED Final   Parainfluenza Virus 1 NOT DETECTED NOT DETECTED Final   Parainfluenza Virus 2 NOT DETECTED NOT DETECTED Final   Parainfluenza Virus 3 NOT DETECTED NOT DETECTED Final   Parainfluenza Virus 4 NOT DETECTED NOT DETECTED Final   Respiratory Syncytial Virus NOT DETECTED NOT DETECTED Final   Bordetella pertussis NOT DETECTED NOT DETECTED Final   Bordetella Parapertussis NOT DETECTED NOT DETECTED Final   Chlamydophila pneumoniae NOT DETECTED NOT DETECTED Final   Mycoplasma pneumoniae NOT DETECTED NOT DETECTED Final    Comment: Performed at [facility], [address] [place], [place] [address]  MRSA Next Gen by PCR, Nasal     Status: None   Collection Time: 04/22/24  5:58 PM   Specimen: Nasal Mucosa; Nasal Swab  Result Value Ref Range Status   MRSA by PCR Next Gen NOT DETECTED NOT DETECTED Final    Comment: (NOTE) The GeneXpert MRSA Assay (FDA approved for NASAL specimens only), is one component of a comprehensive MRSA colonization surveillance program. It is not intended to diagnose MRSA infection nor to guide or monitor treatment for MRSA infections. Test performance is not FDA approved in patients less than 51 years old. Performed at [facility], [address] [place], [place] [address]      Labs: BNP (last 3 results) Recent Labs    04/16/24 1029 04/17/24 0358 04/24/24 0412  BNP 681.0* 927.0* 695.0*   Basic Metabolic Panel: Recent Labs  Lab 04/19/24 0407 04/20/24 0341 04/21/24 0322 04/22/24  9561 04/22/24 1400 04/23/24 0418 04/24/24 0412 04/25/24 0411  NA 134*   < > 136 135  --  137 139 137  K 4.2   < > 4.8 5.4* 4.8 4.6 4.4 4.0  CL 98   < > 101 101  --  100 101 103  CO2 24   < > 26 25  --  26 28 26   GLUCOSE 162*   < > 165* 155*  --  124* 125* 123*  BUN 41*   < > 49* 58*  --  64* 64* 54*  CREATININE 2.62*   < > 2.65* 2.96*  --  2.91* 2.75* 2.21*  CALCIUM  8.5*   < > 8.3* 8.3*  --  8.3* 8.3* 8.0*  MG 2.3  --  2.5*  --   --  2.5*  --  2.5*   < > = values in this interval not displayed.   Liver Function Tests: Recent Labs  Lab 04/22/24 0954  AST 55*  ALT 44  ALKPHOS 94  BILITOT 2.2*  PROT 6.2*  ALBUMIN  3.0*   No results for input(s): LIPASE, AMYLASE in the last 168 hours. Recent Labs  Lab 04/22/24 0954  AMMONIA 33   CBC: Recent Labs  Lab 04/19/24 0407 04/20/24 0341 04/21/24 0322 04/22/24 0954 04/23/24 0418 04/24/24 0412 04/25/24 0411  WBC 10.2 10.2 11.8* 13.3* 11.3* 9.6 8.5  NEUTROABS 7.7 7.4  --   --   --   --   --   HGB 12.3* 12.0* 12.7* 13.2 12.0* 12.3* 12.3*  HCT 38.5*  38.5* 40.5 41.8 38.9* 39.8 39.5  MCV 92.3 93.2 93.1 93.1 93.3 93.6 93.8  PLT 155 151 145* 169 178 230 234   Cardiac Enzymes: Recent Labs  Lab 04/19/24 0407 04/20/24 0341 04/21/24 0322  CKTOTAL 690* 519* 348   BNP: Invalid input(s): POCBNP CBG: Recent Labs  Lab 04/24/24 1114 04/24/24 1641 04/24/24 1917 04/25/24 0336 04/25/24 0727  GLUCAP 154* 163* 184* 125* 128*   D-Dimer No results for input(s): DDIMER in the last 72 hours. Hgb A1c No results for input(s): HGBA1C in the last 72 hours. Lipid Profile No results for input(s): CHOL, HDL, LDLCALC, TRIG, CHOLHDL, LDLDIRECT in the last 72 hours. Thyroid function studies No results for input(s): TSH, T4TOTAL, T3FREE, THYROIDAB in the last 72 hours.  Invalid input(s): FREET3 Anemia work up No results for input(s): VITAMINB12, FOLATE, FERRITIN, TIBC, IRON, RETICCTPCT in the last 72 hours. Urinalysis    Component Value Date/Time   COLORURINE YELLOW 04/22/2024 2005   APPEARANCEUR CLEAR 04/22/2024 2005   LABSPEC 1.010 04/22/2024 2005   PHURINE 5.0 04/22/2024 2005   GLUCOSEU >=500 (A) 04/22/2024 2005   HGBUR NEGATIVE 04/22/2024 2005   BILIRUBINUR NEGATIVE 04/22/2024 2005   KETONESUR NEGATIVE 04/22/2024 2005   PROTEINUR NEGATIVE 04/22/2024 2005   UROBILINOGEN 0.2 02/04/2014 0939   NITRITE NEGATIVE 04/22/2024 2005   LEUKOCYTESUR NEGATIVE 04/22/2024 2005   Sepsis Labs Recent Labs  Lab 04/22/24 0954 04/23/24 0418 04/24/24 0412 04/25/24 0411  WBC 13.3* 11.3* 9.6 8.5   Microbiology Recent Results (from the past 240 hours)  Resp panel by RT-PCR (RSV, Flu A&B, Covid) Anterior Nasal Swab     Status: None   Collection Time: 04/16/24 11:04 AM   Specimen: Anterior Nasal Swab  Result Value Ref Range Status   SARS Coronavirus 2 by RT PCR NEGATIVE NEGATIVE Final    Comment: (NOTE) SARS-CoV-2 target nucleic acids are NOT DETECTED.  The SARS-CoV-2 RNA is generally detectable in  upper  respiratory specimens during the acute phase of infection. The lowest concentration of SARS-CoV-2 viral copies this assay can detect is 138 copies/mL. A negative result does not preclude SARS-Cov-2 infection and should not be used as the sole basis for treatment or other patient management decisions. A negative result may occur with  improper specimen collection/handling, submission of specimen other than nasopharyngeal swab, presence of viral mutation(s) within the areas targeted by this assay, and inadequate number of viral copies(<138 copies/mL). A negative result must be combined with clinical observations, patient history, and epidemiological information. The expected result is Negative.  Fact Sheet for Patients:  BloggerCourse.com  Fact Sheet for Healthcare Providers:  SeriousBroker.it  This test is no t yet approved or cleared by the [place]  FDA and  has been authorized for detection and/or diagnosis of SARS-CoV-2 by FDA under an Emergency Use Authorization (EUA). This EUA will remain  in effect (meaning this test can be used) for the duration of the COVID-19 declaration under Section 564(b)(1) of the Act, 21 U.S.C.section 360bbb-3(b)(1), unless the authorization is terminated  or revoked sooner.       Influenza A by PCR NEGATIVE NEGATIVE Final   Influenza B by PCR NEGATIVE NEGATIVE Final    Comment: (NOTE) The Xpert Xpress SARS-CoV-2/FLU/RSV plus assay is intended as an aid in the diagnosis of influenza from Nasopharyngeal swab specimens and should not be used as a sole basis for treatment. Nasal washings and aspirates are unacceptable for Xpert Xpress SARS-CoV-2/FLU/RSV testing.  Fact Sheet for Patients: BloggerCourse.com  Fact Sheet for Healthcare Providers: SeriousBroker.it  This test is not yet approved or cleared by the [place]  FDA and has been  authorized for detection and/or diagnosis of SARS-CoV-2 by FDA under an Emergency Use Authorization (EUA). This EUA will remain in effect (meaning this test can be used) for the duration of the COVID-19 declaration under Section 564(b)(1) of the Act, 21 U.S.C. section 360bbb-3(b)(1), unless the authorization is terminated or revoked.     Resp Syncytial Virus by PCR NEGATIVE NEGATIVE Final    Comment: (NOTE) Fact Sheet for Patients: BloggerCourse.com  Fact Sheet for Healthcare Providers: SeriousBroker.it  This test is not yet approved or cleared by the [place]  FDA and has been authorized for detection and/or diagnosis of SARS-CoV-2 by FDA under an Emergency Use Authorization (EUA). This EUA will remain in effect (meaning this test can be used) for the duration of the COVID-19 declaration under Section 564(b)(1) of the Act, 21 U.S.C. section 360bbb-3(b)(1), unless the authorization is terminated or revoked.  Performed at [facility], [address] [place], [place] [address]   Respiratory (~20 pathogens) panel by PCR     Status: None   Collection Time: 04/18/24  4:55 PM   Specimen: Nasopharyngeal Swab; Respiratory  Result Value Ref Range Status   Adenovirus NOT DETECTED NOT DETECTED Final   Coronavirus 229E NOT DETECTED NOT DETECTED Final    Comment: (NOTE) The Coronavirus on the Respiratory Panel, DOES NOT test for the novel  Coronavirus (2019 nCoV)    Coronavirus HKU1 NOT DETECTED NOT DETECTED Final   Coronavirus NL63 NOT DETECTED NOT DETECTED Final   Coronavirus OC43 NOT DETECTED NOT DETECTED Final   Metapneumovirus NOT DETECTED NOT DETECTED Final   Rhinovirus / Enterovirus NOT DETECTED NOT DETECTED Final   Influenza A NOT DETECTED NOT DETECTED Final   Influenza B NOT DETECTED NOT DETECTED Final   Parainfluenza Virus 1 NOT DETECTED NOT DETECTED Final   Parainfluenza Virus 2  NOT DETECTED NOT DETECTED Final    Parainfluenza Virus 3 NOT DETECTED NOT DETECTED Final   Parainfluenza Virus 4 NOT DETECTED NOT DETECTED Final   Respiratory Syncytial Virus NOT DETECTED NOT DETECTED Final   Bordetella pertussis NOT DETECTED NOT DETECTED Final   Bordetella Parapertussis NOT DETECTED NOT DETECTED Final   Chlamydophila pneumoniae NOT DETECTED NOT DETECTED Final   Mycoplasma pneumoniae NOT DETECTED NOT DETECTED Final    Comment: Performed at [facility], [address] [place], [place] [address]  MRSA Next Gen by PCR, Nasal     Status: None   Collection Time: 04/22/24  5:58 PM   Specimen: Nasal Mucosa; Nasal Swab  Result Value Ref Range Status   MRSA by PCR Next Gen NOT DETECTED NOT DETECTED Final    Comment: (NOTE) The GeneXpert MRSA Assay (FDA approved for NASAL specimens only), is one component of a comprehensive MRSA colonization surveillance program. It is not intended to diagnose MRSA infection nor to guide or monitor treatment for MRSA infections. Test performance is not FDA approved in patients less than 34 years old. Performed at [facility], [address], [place], [place] [address]      Time coordinating discharge: 35 minutes  SIGNED:   Adron JONETTA Fairly, DO [facility] 04/25/2024, 10:40 AM  If 7PM-7AM, please contact night-coverage [email or web address]

## 2024-04-25 NOTE — Care Management Important Message (Signed)
 Important Message  Patient Details  Name: Wayne Gordon MRN: [id number] Date of Birth: Jan 27, 1934   Important Message Given:  Yes - Medicare IM     Carigan Lister L Khadija Thier 04/25/2024, 12:48 PM

## 2024-04-25 NOTE — Progress Notes (Signed)
 Report called to RN at [facility] Patient to transfer to room 134 via bed after lunch

## 2024-04-25 NOTE — TOC Transition Note (Addendum)
 Transition of Care [facility]) - Discharge Note   Patient Details  Name: Wayne Gordon MRN: [id number] Date of Birth: [birth year]  Transition of Care [facility]) CM/SW Contact:  Lucie Lunger, LCSWA Phone Number: 04/25/2024, 10:50 AM  Clinical Narrative:    CSW updated that pt is medically stable for D/C to [facility] today. CSW spoke with Kirke at [facility] who states pt can arrive today. Insurance shara has been approved. CSW sent D/C clinicals in HUB to facility. Kerri with [facility] states she will follow up with pts wife to complete admission paperwork. CSW to provide RN with room and report numbers. CSW consulted to place outpatient palliative referral, referral made to [facility]. TOC signing off.   Final next level of care: Skilled Nursing Facility Barriers to Discharge: Barriers Resolved   Patient Goals and CMS Choice Patient states their goals for this hospitalization and ongoing recovery are:: go to SNF CMS Medicare.gov Compare Post Acute Care list provided to:: Patient Choice offered to / list presented to : Patient, Spouse [place] ownership interest in [facility].provided to:: Spouse    Discharge Placement                    Patient and family notified of of transfer: 04/25/24  Discharge Plan and Services Additional resources added to the After Visit Summary for   In-house Referral: Clinical Social Work Discharge Planning Services: CM Consult Post Acute Care Choice: Home Health                    HH Arranged: PT, OT [facility] Agency: [facility] Date [facility] Agency Contacted: 04/17/24   Representative spoke with at [facility] Agency: Darleene  Social Drivers of Health (SDOH) Interventions SDOH Screenings   Food Insecurity: No Food Insecurity (04/16/2024)  Housing: Low Risk  (04/16/2024)  Transportation Needs: No Transportation Needs (04/16/2024)  Utilities: Not At Risk (04/16/2024)  Financial Resource Strain: Medium Risk (09/05/2022)   Received from [facility]  Physical Activity:  Unknown (09/05/2022)   Received from [facility]  Social Connections: Socially Integrated (04/16/2024)  Recent Concern: Social Connections - Moderately Isolated (03/29/2024)  Stress: No Stress Concern Present (09/05/2022)   Received from [facility]  Tobacco Use: Low Risk  (04/18/2024)  Health Literacy: Low Risk  (04/06/2020)   Received from [facility]     Readmission Risk Interventions    04/25/2024   10:48 AM 04/17/2024   11:19 AM 04/01/2024   10:47 AM  Readmission Risk Prevention Plan  Transportation Screening Complete Complete Complete  PCP or Specialist Appt within 5-7 Days   Complete  Home Care Screening  Complete Complete  Medication Review (RN CM)  Complete Complete  HRI or Home Care Consult Complete    Social Work Consult for Recovery Care Planning/Counseling Complete    Palliative Care Screening Not Applicable    Medication Review Oceanographer) Complete

## 2024-04-26 ENCOUNTER — Encounter: Payer: Self-pay | Admitting: Internal Medicine

## 2024-04-26 ENCOUNTER — Non-Acute Institutional Stay (SKILLED_NURSING_FACILITY): Payer: Self-pay | Admitting: Internal Medicine

## 2024-04-26 DIAGNOSIS — I5023 Acute on chronic systolic (congestive) heart failure: Secondary | ICD-10-CM | POA: Diagnosis not present

## 2024-04-26 DIAGNOSIS — R748 Abnormal levels of other serum enzymes: Secondary | ICD-10-CM

## 2024-04-26 DIAGNOSIS — N179 Acute kidney failure, unspecified: Secondary | ICD-10-CM | POA: Diagnosis not present

## 2024-04-26 DIAGNOSIS — N183 Chronic kidney disease, stage 3 unspecified: Secondary | ICD-10-CM

## 2024-04-26 DIAGNOSIS — E1151 Type 2 diabetes mellitus with diabetic peripheral angiopathy without gangrene: Secondary | ICD-10-CM | POA: Diagnosis not present

## 2024-04-26 DIAGNOSIS — R4189 Other symptoms and signs involving cognitive functions and awareness: Secondary | ICD-10-CM

## 2024-04-26 DIAGNOSIS — R29818 Other symptoms and signs involving the nervous system: Secondary | ICD-10-CM

## 2024-04-26 NOTE — Patient Instructions (Signed)
 See assessment and plan under each diagnosis in the problem list and acutely for this visit
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
## 2024-04-27 DIAGNOSIS — E1151 Type 2 diabetes mellitus with diabetic peripheral angiopathy without gangrene: Secondary | ICD-10-CM | POA: Insufficient documentation

## 2024-04-27 DIAGNOSIS — R29818 Other symptoms and signs involving the nervous system: Secondary | ICD-10-CM | POA: Insufficient documentation

## 2024-04-27 NOTE — Assessment & Plan Note (Signed)
 Continue Torsemide  with weight monitor.Cardiology F/U as scheduled.

## 2024-04-27 NOTE — Assessment & Plan Note (Addendum)
 Current creatinine 2.21 with eGFR 28 , indicating high Stage 4 CKD. Med List reviewed ; no change indicated in reference to CKD.

## 2024-04-27 NOTE — Assessment & Plan Note (Signed)
 [facility] Speech Therapist will perform MMSE.

## 2024-04-27 NOTE — Assessment & Plan Note (Signed)
 Current A1c 7.7% indicating adequate control. No change indicated.

## 2024-04-27 NOTE — Assessment & Plan Note (Addendum)
 Note: AST peak was 55; acetaminophen  dosage wuill be adjusted & Benadryl  PM D/Ced.

## 2024-04-30 ENCOUNTER — Encounter (HOSPITAL_COMMUNITY)

## 2024-04-30 DIAGNOSIS — N184 Chronic kidney disease, stage 4 (severe): Secondary | ICD-10-CM | POA: Insufficient documentation

## 2024-04-30 DIAGNOSIS — E1122 Type 2 diabetes mellitus with diabetic chronic kidney disease: Secondary | ICD-10-CM | POA: Insufficient documentation

## 2024-04-30 DIAGNOSIS — E1142 Type 2 diabetes mellitus with diabetic polyneuropathy: Secondary | ICD-10-CM | POA: Insufficient documentation

## 2024-04-30 DIAGNOSIS — E1169 Type 2 diabetes mellitus with other specified complication: Secondary | ICD-10-CM | POA: Insufficient documentation

## 2024-05-07 ENCOUNTER — Telehealth: Payer: Self-pay

## 2024-05-07 NOTE — Telephone Encounter (Addendum)
 Alert received from [employer]. No transmission received for at least 21 days.   _____________________________________________________________________  Wayne Gordon w/ patient wife regarding missing transmissions. States that patient has been in Rehab @ [facility] for the last 16 days. Estimated to return home on Friday or Saturday per patient wife.   Informed to call device clinic w/ any questions or concerns. Will continue to monitor for incoming transmission and update accordingly.

## 2024-05-08 ENCOUNTER — Non-Acute Institutional Stay (SKILLED_NURSING_FACILITY): Payer: Self-pay | Admitting: Adult Health

## 2024-05-08 ENCOUNTER — Encounter: Payer: Self-pay | Admitting: Adult Health

## 2024-05-08 DIAGNOSIS — I5023 Acute on chronic systolic (congestive) heart failure: Secondary | ICD-10-CM | POA: Diagnosis not present

## 2024-05-08 DIAGNOSIS — I129 Hypertensive chronic kidney disease with stage 1 through stage 4 chronic kidney disease, or unspecified chronic kidney disease: Secondary | ICD-10-CM | POA: Diagnosis not present

## 2024-05-08 DIAGNOSIS — J181 Lobar pneumonia, unspecified organism: Secondary | ICD-10-CM | POA: Diagnosis not present

## 2024-05-08 DIAGNOSIS — N184 Chronic kidney disease, stage 4 (severe): Secondary | ICD-10-CM

## 2024-05-08 DIAGNOSIS — E1122 Type 2 diabetes mellitus with diabetic chronic kidney disease: Secondary | ICD-10-CM

## 2024-05-08 NOTE — Progress Notes (Signed)
 Location:  [facility] Room Number: 134 P Place of Service:  SNF (31)   CODE STATUS: DNR   Allergies  Allergen Reactions   Ace Inhibitors Swelling and Other (See Comments)    Kidney issues; angioedema per PMH in chart   Penicillins Palpitations    Chief Complaint  Patient presents with   Discharge Note    HPI:  He is being discharged to home with home health for pt/ot. He will not need any dme. He will need his prescriptions written and will need to follow up with his medical provider. He had bee hospitalized for acute congestive heart failure and pneumonia. He was admitted to this facility for short term rehab. Therapy: ambulate 200 feet with rolling walker and supervision; upper and lower body with supervision; brp: supervision. SLUMS 19/30.   Past Medical History:  Diagnosis Date   Allergy to ACE inhibitors    Angioedema many years ago; patient has tolerated Losartan  (ARB) in the past - it was held during admisson for GI bleed and worsening renal function >> resume Losartan  25 mg QD 06/2015   Arthritis    Atrial fibrillation (HCC)    PAF, CHADs2Vasc = 5   Carotid artery disease (HCC)    60-79% LICA   CHF (congestive heart failure) (HCC)    CKD (chronic kidney disease)    Coronary artery disease    Diabetes mellitus    GERD (gastroesophageal reflux disease)    Hyperlipidemia    Hypertension    ICD (implantable cardiac defibrillator) in place 03/28/2012   [place]ron[place], Dr. Waddell 03/28/12   Ischemic cardiomyopathy    S/P CABG x 3; EF 25%   Mitral regurgitation    S/P mitral valve repair 2013   Myocardial infarction [facility])    Renal insufficiency     Past Surgical History:  Procedure Laterality Date   BIV UPGRADE N/A 10/12/2020   Procedure: BIV ICD UPGRADE;  Surgeon: Waddell Danelle ORN, MD;  Location: [facility] INVASIVE CV LAB;  Service: Cardiovascular;  Laterality: N/A;   CARDIOVERSION N/A 04/01/2024   Procedure: CARDIOVERSION;  Surgeon: Lonni Slain, MD;  Location: [facility] INVASIVE CV LAB;  Service: Cardiovascular;  Laterality: N/A;   CHOLECYSTECTOMY N/A 09/20/2017   Procedure: LAPAROSCOPIC SUBTOTAL CHOLECYSTECTOMY;  Surgeon: Rubin Calamity, MD;  Location: [facility] OR;  Service: General;  Laterality: N/A;   COLONOSCOPY N/A 04/24/2014   RMR Pancolonic diverticulosis   CORONARY ARTERY BYPASS GRAFT  10/17/2011   Procedure: CORONARY ARTERY BYPASS GRAFTING (CABG);  Surgeon: Sudie VEAR Laine, MD;  Location: [facility] OR;  Service: Open Heart Surgery;  Laterality: N/A;   ERCP N/A 09/29/2017   Procedure: ENDOSCOPIC RETROGRADE CHOLANGIOPANCREATOGRAPHY (ERCP);  Surgeon: Rollin Dover, MD;  Location: [facility] ENDOSCOPY;  Service: Endoscopy;  Laterality: N/A;   ESOPHAGOGASTRODUODENOSCOPY N/A 04/24/2014   RMR Subtle nodularity the gastric mucosa of uncertain significance-status post biopsy. Hiatal hernia. chronic inflammation, no H.pylori   ESOPHAGOGASTRODUODENOSCOPY N/A 06/24/2016   Dr. Shaaron: normal esophagus, small hiatal hernia, normal duodenum   ICD  03/28/2012   IMPLANTABLE CARDIOVERTER DEFIBRILLATOR IMPLANT N/A 03/28/2012   Procedure: IMPLANTABLE CARDIOVERTER DEFIBRILLATOR IMPLANT;  Surgeon: Danelle ORN Waddell, MD;  Location: [facility] CATH LAB;  Service: Cardiovascular;  Laterality: N/A;   KNEE ARTHROSCOPY  ~ 2008   right   LEFT AND RIGHT HEART CATHETERIZATION WITH CORONARY ANGIOGRAM N/A 10/12/2011   Procedure: LEFT AND RIGHT HEART CATHETERIZATION WITH CORONARY ANGIOGRAM;  Surgeon: Deatrice DELENA Cage, MD;  Location: [facility] CATH LAB;  Service: Cardiovascular;  Laterality:  N/A;   MITRAL VALVE REPAIR  10/17/2011   Procedure: MITRAL VALVE REPAIR (MVR);  Surgeon: Sudie VEAR Laine, MD;  Location: [facility] OR;  Service: Open Heart Surgery;  Laterality: N/A;   NASAL SINUS SURGERY  1990's   right    Social History   Socioeconomic History   Marital status: Married    Spouse name: Not on file   Number of children: Not on file   Years of education: Not on file   Highest education level: Some  college, no degree  Occupational History   Occupation: Retired  Tobacco Use   Smoking status: Never   Smokeless tobacco: Never  Vaping Use   Vaping status: Never Used  Substance and Sexual Activity   Alcohol use: No    Alcohol/week: 0.0 standard drinks of alcohol   Drug use: No   Sexual activity: Yes  Other Topics Concern   Not on file  Social History Narrative   Not on file   Social Drivers of Health   Financial Resource Strain: Medium Risk (09/05/2022)   Received from [facility]   Overall Financial Resource Strain (CARDIA)    Difficulty of Paying Living Expenses: Somewhat hard  Food Insecurity: No Food Insecurity (04/16/2024)   Hunger Vital Sign    Worried About Running Out of Food in the Last Year: Never true    Ran Out of Food in the Last Year: Never true  Transportation Needs: No Transportation Needs (04/16/2024)   PRAPARE - Administrator, Civil Service (Medical): No    Lack of Transportation (Non-Medical): No  Physical Activity: Unknown (09/05/2022)   Received from [facility]   Exercise Vital Sign    On average, how many days per week do you engage in moderate to strenuous exercise (like a brisk walk)?: Patient declined    Minutes of Exercise per Session: Not on file  Stress: No Stress Concern Present (09/05/2022)   Received from [facility] - Occupational Stress Questionnaire    Feeling of Stress : Only a little  Social Connections: Socially Integrated (04/16/2024)   Social Connection and Isolation Panel    Frequency of Communication with Friends and Family: Twice a week    Frequency of Social Gatherings with Friends and Family: Twice a week    Attends Religious Services: 1 to 4 times per year    Active Member of [employer] or Organizations: No    Attends Engineer, structural: 1 to 4 times per year    Marital Status: Married  Recent Concern: Social Connections - Moderately Isolated (03/29/2024)   Social  Connection and Isolation Panel    Frequency of Communication with Friends and Family: More than three times a week    Frequency of Social Gatherings with Friends and Family: Twice a week    Attends Religious Services: Never    Database administrator or Organizations: No    Attends Banker Meetings: Never    Marital Status: Married  Catering manager Violence: Not At Risk (04/16/2024)   Humiliation, Afraid, Rape, and Kick questionnaire    Fear of Current or Ex-Partner: No    Emotionally Abused: No    Physically Abused: No    Sexually Abused: No   Family History  Problem Relation Age of Onset   Heart disease Mother    Diabetes Father    Cardiomyopathy Father    Colon cancer Neg Hx       VITAL  SIGNS BP (!) 145/62   Pulse 70   Temp (!) 97.5 F (36.4 C)   Resp 20   Ht 6' 4 (1.93 m)   Wt 235 lb (106.6 kg)   SpO2 96%   BMI 28.61 kg/m   Outpatient Encounter Medications as of 05/08/2024  Medication Sig   acetaminophen  (TYLENOL ) 650 MG CR tablet Take 1,300 mg by mouth every 8 (eight) hours as needed for pain.   carvedilol  (COREG ) 3.125 MG tablet Take 1 tablet (3.125 mg total) by mouth 2 (two) times daily with a meal.   dapagliflozin  propanediol (FARXIGA ) 10 MG TABS tablet Take 1 tablet (10 mg total) by mouth daily.   ELIQUIS  2.5 MG TABS tablet TAKE (1) TABLET TWICE A DAY.   gabapentin  (NEURONTIN ) 100 MG capsule Take 300 mg by mouth at bedtime.   guaiFENesin  (ROBITUSSIN) 100 MG/5ML liquid Take 5 mLs by mouth every 4 (four) hours as needed for cough or to loosen phlegm.   HUMALOG  KWIKPEN 100 UNIT/ML KwikPen Inject 4-10 Units into the skin 2 (two) times daily. 4 units lunch, 10 units dinner   Insulin  Glargine (TOUJEO  SOLOSTAR) 300 UNIT/ML SOPN Inject 26 Units into the skin daily.   melatonin 5 MG TABS Take 5 mg by mouth at bedtime.   omeprazole  (PRILOSEC) 40 MG capsule Take 1 capsule (40 mg total) by mouth daily before breakfast.   rosuvastatin  (CRESTOR ) 5 MG tablet Take  5 mg by mouth daily.   torsemide  40 MG TABS Take 40 mg by mouth daily.   No facility-administered encounter medications on file as of 05/08/2024.     SIGNIFICANT DIAGNOSTIC EXAMS  LABS  04-16-24: wbc 10.8; hgb 13.1; hct 41.2; mcv 92.6 plt 206; glucose 130;bun 36; creat 2.75; k+ 3.4; na++ 136; ca 8.2; gfr 22; protein 6.1 albumin  3.2 04-18-24: wbc 11.1; hgb 12.8; hct 41.0; mcv 92.1 plt 172; glucose 160; bun 42; creat 2.68; k+ 4.2; an++ 138; ca 8.4 gfr 22; mag 2.4; vitamin B12: 1326; folate 19.7; tsh 4.368; free t4: 2.02 04-25-24: wbc 8.5; hgb 12.3; hct 39.5; mcv 93.8 plt 234; glucose 123; bun 54; creat 2.21; k+ 4.0; na++ 137; ca 8.0; gfr 28   Review of Systems  Constitutional:  Negative for malaise/fatigue.  Respiratory:  Negative for cough and shortness of breath.   Cardiovascular:  Negative for chest pain, palpitations and leg swelling.  Gastrointestinal:  Negative for abdominal pain, constipation and heartburn.  Musculoskeletal:  Negative for back pain, joint pain and myalgias.  Skin: Negative.   Neurological:  Negative for dizziness.  Psychiatric/Behavioral:  The patient is not nervous/anxious.    Physical Exam Constitutional:      General: He is not in acute distress.    Appearance: He is well-developed. He is not diaphoretic.  Neck:     Thyroid: No thyromegaly.  Cardiovascular:     Rate and Rhythm: Normal rate and regular rhythm.     Heart sounds: Normal heart sounds.  Pulmonary:     Effort: Pulmonary effort is normal. No respiratory distress.     Breath sounds: Normal breath sounds.  Abdominal:     General: Bowel sounds are normal. There is no distension.     Palpations: Abdomen is soft.     Tenderness: There is no abdominal tenderness.  Musculoskeletal:        General: Normal range of motion.     Cervical back: Neck supple.     Right lower leg: No edema.     Left lower leg:  No edema.  Lymphadenopathy:     Cervical: No cervical adenopathy.  Skin:    General: Skin is  warm and dry.  Neurological:     Mental Status: He is alert. Mental status is at baseline.  Psychiatric:        Mood and Affect: Mood normal.      ASSESSMENT/ PLAN:   Patient is being discharged with the following home health services:  pt/ot to evaluate and treat as indicated for gait balance strength adl training.   Patient is being discharged with the following durable medical equipment:  none needed   Patient has been advised to f/u with their PCP in 1-2 weeks to for a transitions of care visit.  Social services at their facility was responsible for arranging this appointment.  Pt was provided with adequate prescriptions of noncontrolled medications to reach the scheduled appointment .  For controlled substances, a limited supply was provided as appropriate for the individual patient.  If the pt normally receives these medications from a pain clinic or has a contract with another physician, these medications should be received from that clinic or physician only).    A 30 day supply of her prescription medications have been sent to [facility] pharmacy  Time spent with patient: 40 minutes: medications; dme; home health.   Barnie Seip NP [facility]   call [phone]

## 2024-05-09 ENCOUNTER — Other Ambulatory Visit: Payer: Self-pay | Admitting: Adult Health

## 2024-05-09 DIAGNOSIS — I480 Paroxysmal atrial fibrillation: Secondary | ICD-10-CM

## 2024-05-09 DIAGNOSIS — K219 Gastro-esophageal reflux disease without esophagitis: Secondary | ICD-10-CM

## 2024-05-09 MED ORDER — HUMALOG KWIKPEN 100 UNIT/ML ~~LOC~~ SOPN: 4.0000 [IU] | PEN_INJECTOR | Freq: Two times a day (BID) | SUBCUTANEOUS | 0 refills | Status: AC

## 2024-05-09 MED ORDER — CARVEDILOL 3.125 MG PO TABS: 3.1250 mg | ORAL_TABLET | Freq: Two times a day (BID) | ORAL | 0 refills | Status: DC

## 2024-05-09 MED ORDER — APIXABAN 2.5 MG PO TABS: 2.5000 mg | ORAL_TABLET | Freq: Two times a day (BID) | ORAL | 0 refills | Status: AC

## 2024-05-09 MED ORDER — OMEPRAZOLE 40 MG PO CPDR: 40.0000 mg | DELAYED_RELEASE_CAPSULE | Freq: Every day | ORAL | 0 refills | Status: AC

## 2024-05-09 MED ORDER — TORSEMIDE 40 MG PO TABS: 40.0000 mg | ORAL_TABLET | Freq: Every day | ORAL | 0 refills | Status: DC

## 2024-05-09 MED ORDER — DAPAGLIFLOZIN PROPANEDIOL 10 MG PO TABS: 10.0000 mg | ORAL_TABLET | Freq: Every day | ORAL | 0 refills | Status: DC

## 2024-05-09 MED ORDER — ROSUVASTATIN CALCIUM 5 MG PO TABS: 5.0000 mg | ORAL_TABLET | Freq: Every day | ORAL | 0 refills | Status: AC

## 2024-05-09 MED ORDER — TOUJEO SOLOSTAR 300 UNIT/ML ~~LOC~~ SOPN: 26.0000 [IU] | PEN_INJECTOR | Freq: Every day | SUBCUTANEOUS | 0 refills | Status: AC

## 2024-05-09 MED ORDER — GABAPENTIN 100 MG PO CAPS: 300.0000 mg | ORAL_CAPSULE | Freq: Every day | ORAL | 0 refills | Status: AC

## 2024-05-17 ENCOUNTER — Ambulatory Visit (HOSPITAL_COMMUNITY)

## 2024-05-23 ENCOUNTER — Ambulatory Visit: Admitting: Nurse Practitioner

## 2024-05-23 ENCOUNTER — Ambulatory Visit: Payer: Medicare HMO

## 2024-05-23 ENCOUNTER — Telehealth: Payer: Self-pay | Admitting: Internal Medicine

## 2024-05-23 ENCOUNTER — Telehealth: Payer: Self-pay

## 2024-05-23 DIAGNOSIS — I5021 Acute systolic (congestive) heart failure: Secondary | ICD-10-CM | POA: Diagnosis not present

## 2024-05-23 MED ORDER — DAPAGLIFLOZIN PROPANEDIOL 10 MG PO TABS: 10.0000 mg | ORAL_TABLET | Freq: Every day | ORAL | 2 refills | Status: AC

## 2024-05-23 NOTE — Telephone Encounter (Signed)
 Pt requesting a callback in regards to his provider. Please advise.

## 2024-05-23 NOTE — Telephone Encounter (Signed)
 Pt's medication was sent to pt's pharmacy as requested. Confirmation received.

## 2024-05-23 NOTE — Telephone Encounter (Signed)
 Pt scheduled next available with Dr. Waddell in [place] for device testing.

## 2024-05-23 NOTE — Telephone Encounter (Signed)
 Pt calling to state that he received an email that he needed a new prescription for Farxiga . Pt notified that per chart a new prescription was sent today.

## 2024-05-23 NOTE — Telephone Encounter (Signed)
 Scheduled follow up received.  Per review of EGM-Left bundle area lead is not capturing.  Will schedule follow up in [place] for testing/reprogramming.

## 2024-05-23 NOTE — Telephone Encounter (Signed)
*  STAT* If patient is at the pharmacy, call can be transferred to refill team.   1. Which medications need to be refilled? (please list name of each medication and dose if known)   dapagliflozin  propanediol (FARXIGA ) 10 MG TABS tablet     2. Would you like to learn more about the convenience, safety, & potential cost savings by using the [facility]? No   3. Are you open to using the [facility] (Type [facility]. No   4. Which pharmacy/location (including street and city if local pharmacy) is medication to be sent to?[facility] - [place], [place] - [address]    5. Do they need a 30 day or 90 day supply? 90 day    Pt is out of refills and needs new Rx. Pt states he has been receiving medication from manufacturer.

## 2024-05-24 LAB — CUP PACEART REMOTE DEVICE CHECK
Date Time Interrogation Session: 20250925103943
Implantable Lead Connection Status: 753985
Implantable Lead Connection Status: 753985
Implantable Lead Connection Status: 753985
Implantable Lead Implant Date: 20130731
Implantable Lead Implant Date: 20220214
Implantable Lead Implant Date: 20220214
Implantable Lead Location: 753858
Implantable Lead Location: 753859
Implantable Lead Location: 753860
Implantable Lead Model: 365
Implantable Lead Model: 377
Implantable Lead Model: 3830
Implantable Lead Serial Number: 10505793
Implantable Lead Serial Number: 8000207354
Implantable Pulse Generator Implant Date: 20220214
Pulse Gen Model: 429553
Pulse Gen Serial Number: 84824950

## 2024-05-24 NOTE — Progress Notes (Signed)
Remote ICD Transmission.

## 2024-05-26 ENCOUNTER — Ambulatory Visit: Payer: Self-pay | Admitting: Internal Medicine

## 2024-05-27 NOTE — Progress Notes (Signed)
 Remote ICD Transmission

## 2024-05-29 ENCOUNTER — Ambulatory Visit: Attending: Nurse Practitioner | Admitting: Nurse Practitioner

## 2024-05-29 ENCOUNTER — Encounter: Payer: Self-pay | Admitting: Nurse Practitioner

## 2024-05-29 VITALS — BP 112/60 | HR 88 | Ht 78.0 in | Wt 236.8 lb

## 2024-05-29 DIAGNOSIS — I2510 Atherosclerotic heart disease of native coronary artery without angina pectoris: Secondary | ICD-10-CM | POA: Diagnosis not present

## 2024-05-29 DIAGNOSIS — N184 Chronic kidney disease, stage 4 (severe): Secondary | ICD-10-CM

## 2024-05-29 DIAGNOSIS — R42 Dizziness and giddiness: Secondary | ICD-10-CM

## 2024-05-29 DIAGNOSIS — Z9889 Other specified postprocedural states: Secondary | ICD-10-CM

## 2024-05-29 DIAGNOSIS — E785 Hyperlipidemia, unspecified: Secondary | ICD-10-CM

## 2024-05-29 DIAGNOSIS — I38 Endocarditis, valve unspecified: Secondary | ICD-10-CM

## 2024-05-29 DIAGNOSIS — I5020 Unspecified systolic (congestive) heart failure: Secondary | ICD-10-CM | POA: Diagnosis not present

## 2024-05-29 DIAGNOSIS — Z95810 Presence of automatic (implantable) cardiac defibrillator: Secondary | ICD-10-CM

## 2024-05-29 DIAGNOSIS — I10 Essential (primary) hypertension: Secondary | ICD-10-CM

## 2024-05-29 DIAGNOSIS — I4891 Unspecified atrial fibrillation: Secondary | ICD-10-CM

## 2024-05-29 NOTE — Progress Notes (Signed)
 Cardiology Office Note   Date:  05/29/2024 ID:  Sostenes Kauffmann Breck, DOB 1934/04/23, MRN [id number] PCP: Renato Dorothey HERO, NP  [facility] Providers Cardiologist:  Diannah SHAUNNA Maywood, MD     History of Present Illness Wayne Gordon is a 89 y.o. male with a PMH of CAD, s/p CABG x 3, history of mitral valve repair in 2013, hypertension, hyperlipidemia, persistent A-fib/ A-flutter, heart failure, sinus node dysfunction, s/p biventricular ICD in 2022, vertigo, CKD, carotid artery disease, type 2 diabetes, who presents today for 4-6 week follow-up. Followed by Dr. Waddell with electrophysiology.   Last seen for preoperative cardiovascular risk assessment in August 2023.  He was pending spinal cord stimulator at that time.  He overall was doing well.  Last seen by Dr. Waddell on March 06, 2024.  Was overall doing well at the time.  Hospitalized early August 2025 due to weakness.  He presented with weakness that have been progressive since recent ICD adjustment.  ICD was interrogated.  Patient had a flutter that was felt to be the etiology of his current symptoms.  He was started on oral amiodarone  load 400 mg twice daily for 7 days, then to reduce to 200 mg twice daily x 7 days, then 200 mg daily thereafter.  He was diuresed with IV Lasix  due to heart failure exacerbation and underwent cardioversion on April 01, 2024.  He contacted our office shortly after hospital discharge noting issues with amiodarone -stated the medicine was making muscles in his legs and arms lose strength.  Dr. Waddell instructed for patient to stop amiodarone . He contacted the office on 04/08/2024 noting more leg swelling, weight was up 6 pounds in a week.  Denied any shortness of breath.  He had taken 80 mg of Lasix  on 04/09/2024.  He was given appointment to be further evaluated in the office.  04/11/2024 - Today he presents for weight gain and leg swelling evaluation. He states he is about to start rehab for his vertigo. Admits  to swelling noticed along his right leg and weight gain. He sees a specialist in [place] that manages his diabetes and his kidneys. Does admit to occaional hard beats noticed about only 1-2 times per day, not bothersome per his report. Denies any chest pain, shortness of breath, syncope, presyncope, orthopnea, PND, acute bleeding, or claudication.  Tells me he has recently take increased dose of Lasix  per his report, has not noticed much difference.  Confirms to me he is taking Lasix  40 mg daily.  Hospitalized August 2025 d/t acute HFrEF and also noted acute weakness, as well as severe deconditioning, frail and significant peripheral edema.  Hospital course noted by acute on chronic renal failure in part due to cardiorenal syndrome, developed new oxygen  requirement on 2 L.  Palliative care was consulted, patient became DNR.  Creatinine levels did start to downtrend, cardiology was consulted and recommended to continue oral torsemide  40 mg daily.  Today he presents for hospital follow-up. Says he was treated for PNA and is feeling better, breathing well per his report. He is doing therapy for his vertigo.  Tells me he checks his SpO2 on his pulse ox that shows 96% on room air. Denies any chest pain, shortness of breath, palpitations, syncope, presyncope, dizziness, orthopnea, PND, swelling or significant weight changes, acute bleeding, or claudication.   ROS: Negative. See HPI.   Studies Reviewed      EKG: EKG is not ordered today.   Echo 03/2024:   1. No LV  thrombus by Definity . Left ventricular ejection fraction, by  estimation, is 20 to 25%. The left ventricle has severely decreased  function. The left ventricle demonstrates global hypokinesis. The left  ventricular internal cavity size was mildly  dilated. There is mild concentric left ventricular hypertrophy. Left  ventricular diastolic parameters are indeterminate. There is the  interventricular septum is flattened in systole and  diastole, consistent  with right ventricular pressure and volume  overload.   2. Right ventricular systolic function is mildly reduced. The right  ventricular size is moderately enlarged. There is normal pulmonary artery  systolic pressure. The estimated right ventricular systolic pressure is  32.8 mmHg.   3. Left atrial size was mildly dilated.   4. S/p Sorin Memo 3D ring annuloplasty (size 30mm, Catalog #[id number], serial  [id number]). The mitral valve has been repaired/replaced. Mild mitral valve  regurgitation. No evidence of mitral stenosis. The mean mitral valve  gradient is 3.0 mmHg with average  heart rate of 85 bpm. Procedure Date: 10/17/11. Echo findings are  consistent with normal structure and function of the mitral valve  prosthesis.   5. The tricuspid valve is abnormal. Tricuspid valve regurgitation is  moderate.   6. The aortic valve is tricuspid. There is moderate calcification of the  aortic valve. There is moderate thickening of the aortic valve. Aortic  valve regurgitation is trivial. Aortic valve sclerosis/calcification is  present, without any evidence of  aortic stenosis.   7. The inferior vena cava is normal in size with <50% respiratory  variability, suggesting right atrial pressure of 8 mmHg.  Lexiscan  07/2015: Defect 1: There is a large defect of severe severity present in the basal inferior, basal inferolateral, basal anterolateral, mid inferior, mid inferolateral, mid anterolateral, apical inferior and apical lateral location. Findings consistent with prior myocardial infarction. This is a high risk study. Nuclear stress EF: 29%. The aforementioned walls are severely hypokinetic to akinetic.  Physical Exam VS:  BP 112/60 (BP Location: Left Arm, Patient Position: Sitting, Cuff Size: Large)   Pulse 88   Ht 6' 6 (1.981 m)   Wt 236 lb 12.8 oz (107.4 kg)   SpO2 92%   BMI 27.36 kg/m        Wt Readings from Last 3 Encounters:  05/30/24 236 lb 11.2 oz (107.4 kg)   05/29/24 236 lb 12.8 oz (107.4 kg)  05/08/24 235 lb (106.6 kg)    GEN: Well nourished, well developed in no acute distress NECK: No JVD; No carotid bruits CARDIAC: S1/S2, RRR, no murmurs, rubs, gallops RESPIRATORY:  Clear and diminished to auscultation without wheezing, rales, or rhonchi noted ABDOMEN: Soft, non-tender, non-distended EXTREMITIES:  Nonpitting edema to BLE; No deformity   ASSESSMENT AND PLAN  HFrEF Stage C, NYHA class I-II symptoms.  EF 20-25% 03/2024. Weight is down over 10 lbs from last office visit.  Continue current medication regimen.  GDMT limited due to his CKD stage IV. Low sodium diet, fluid restriction <2L, and daily weights encouraged. Educated to contact our office for weight gain of 2 lbs overnight or 5 lbs in one week.  Care and ED precautions discussed.  CAD, s/p CABG x 3 Stable with no anginal symptoms. No indication for ischemic evaluation.  Not on aspirin  due to being on low-dose Eliquis .  Continue current medication regimen. Heart healthy diet and regular cardiovascular exercise encouraged.   Hx of mitral valve repair, valvular insufficiency Most recent echocardiogram revealed Sorin Memo 3D ring annuloplasty.  Mild mitral valve regurgitation noted.  Also  findings of moderate TR with aortic valve sclerosis also noted.  Denies any shortness of breath. Will continue to monitor.   HTN BP stable. Discussed to monitor BP at home at least 2 hours after medications and sitting for 5-10 minutes.  No medication changes at this time getting labs as noted above. Heart healthy diet and regular cardiovascular exercise encouraged.   HLD LDL from last April 2025 61. Continue rosuvastatin .  Not addressed, but at next office visit, plan to discuss increasing dosage or adding Zetia. Goal LDL < 55. Heart healthy diet and regular cardiovascular exercise encouraged.   A-fib Denies any recent tachycardia or palpitations.  Heart rate is well-controlled today. Heart healthy diet  and regular cardiovascular exercise encouraged. Continue low dose Eliquis  for stroke prevention. Denies any bleeding issues.   Sinus node dysfunction, s/p biventricular ICD in 2022 Most recent remote device check showed no arrhythmias. Continue to follow-up with EP.   CKD stage IV Most recent kidney function stable.  Avoid nephrotoxic agents.  Now seeing a nephrologist locally (Dr. Rachele), appreciate recs.  Continue follow-up with nephrology.  9. Veritgo Continue physical therapy treatments.  Did recommend he discuss meclizine with his PCP.   Dispo: Follow-up with MD/APP in 4-6 weeks or sooner if anything changes.   Signed, Almarie Crate, NP

## 2024-05-29 NOTE — Patient Instructions (Addendum)
 Medication Instructions:  Your physician recommends that you continue on your current medications as directed. Please refer to the Current Medication list given to you today. Please discuss meclizine with your family doctor.  Labwork: none  Testing/Procedures: none  Follow-Up: Your physician recommends that you schedule a follow-up appointment in: 4-6 weeks  Any Other Special Instructions Will Be Listed Below (If Applicable). HEART FAILURE INSTRUCTION SHEET  Follow a low-salt diet-you are allowed no more than 2,000 mg of sodium per day. Watch your fluid intake. In general, you should not be taking more than 64 ounces a day (no more than 8 glasses per day). Sometimes we refer to this as 2 liters per day. This includes sources of water  in food like soup, coffee, tea, milk etc. Weigh yourself on the same scale at the same time of the day preferably immediately after your first void. Keep a log of your weights. Call your doctor: (Anytime you feel any of the following symptoms)  3 lbs weight gain overnight or 5 lbs within a week Shortness of breath, with or without a day hacking cough Swelling in hands, feet or stomach If you have to sleep on extra pillows at night in order to breathe   IT IS IMPORTANT TO LET YOUR DOCTOR KNOW EARLY ON IF YOU ARE HAVING SYMPTOMS SO WE CAN HELP YOU!   If you need a refill on your cardiac medications before your next appointment, please call your pharmacy.

## 2024-05-30 ENCOUNTER — Encounter: Payer: Self-pay | Admitting: Internal Medicine

## 2024-05-30 ENCOUNTER — Ambulatory Visit: Attending: Internal Medicine | Admitting: Internal Medicine

## 2024-05-30 VITALS — BP 122/72 | HR 73 | Ht 76.0 in | Wt 236.7 lb

## 2024-05-30 DIAGNOSIS — I5022 Chronic systolic (congestive) heart failure: Secondary | ICD-10-CM

## 2024-05-30 LAB — CUP PACEART INCLINIC DEVICE CHECK
Battery Remaining Percentage: 76 %
Battery Voltage: 2.96 V
Brady Statistic AP VP Percent: 88 %
Brady Statistic AP VS Percent: 0 %
Brady Statistic AS VP Percent: 11 %
Brady Statistic AS VS Percent: 0 %
Brady Statistic RA Percent Paced: 86 %
Date Time Interrogation Session: 20251002085321
HighPow Impedance: 66 Ohm
Implantable Lead Connection Status: 753985
Implantable Lead Connection Status: 753985
Implantable Lead Connection Status: 753985
Implantable Lead Implant Date: 20130731
Implantable Lead Implant Date: 20220214
Implantable Lead Implant Date: 20220214
Implantable Lead Location: 753858
Implantable Lead Location: 753859
Implantable Lead Location: 753860
Implantable Lead Model: 365
Implantable Lead Model: 377
Implantable Lead Model: 3830
Implantable Lead Serial Number: 10505793
Implantable Lead Serial Number: 8000207354
Implantable Pulse Generator Implant Date: 20220214
Lead Channel Impedance Value: 464 Ohm
Lead Channel Impedance Value: 542 Ohm
Lead Channel Impedance Value: 639 Ohm
Lead Channel Pacing Threshold Amplitude: 0.7 V
Lead Channel Pacing Threshold Amplitude: 0.9 V
Lead Channel Pacing Threshold Amplitude: 1.7 V
Lead Channel Pacing Threshold Pulse Width: 0.4 ms
Lead Channel Pacing Threshold Pulse Width: 0.4 ms
Lead Channel Pacing Threshold Pulse Width: 0.4 ms
Lead Channel Setting Pacing Amplitude: 1.9 V
Lead Channel Setting Pacing Amplitude: 2.5 V
Lead Channel Setting Pacing Amplitude: 3 V
Lead Channel Setting Pacing Pulse Width: 0.4 ms
Lead Channel Setting Pacing Pulse Width: 0.4 ms
Lead Channel Setting Sensing Sensitivity: 0.8 mV
Lead Channel Setting Sensing Sensitivity: 1.6 mV
Pulse Gen Model: 429553
Pulse Gen Serial Number: 84824950

## 2024-05-30 NOTE — Patient Instructions (Signed)
 Medication Instructions:  Your physician recommends that you continue on your current medications as directed. Please refer to the Current Medication list given to you today.  *If you need a refill on your cardiac medications before your next appointment, please call your pharmacy*  Lab Work: NONE   If you have labs (blood work) drawn today and your tests are completely normal, you will receive your results only by: MyChart Message (if you have MyChart) OR A paper copy in the mail If you have any lab test that is abnormal or we need to change your treatment, we will call you to review the results.  Testing/Procedures: NONE   Follow-Up: At [facility], you and your health needs are our priority.  As part of our continuing mission to provide you with exceptional heart care, our providers are all part of one team.  This team includes your primary Cardiologist (physician) and Advanced Practice Providers or APPs (Physician Assistants and Nurse Practitioners) who all work together to provide you with the care you need, when you need it.  Your next appointment:   6 month(s)  Provider:   Donnice Primus, MD    We recommend signing up for the patient portal called MyChart.  Sign up information is provided on this After Visit Summary.  MyChart is used to connect with patients for Virtual Visits (Telemedicine).  Patients are able to view lab/test results, encounter notes, upcoming appointments, etc.  Non-urgent messages can be sent to your provider as well.   To learn more about what you can do with MyChart, go to [email or web address].   Other Instructions Thank you for choosing [facility]!

## 2024-05-30 NOTE — Progress Notes (Signed)
 HPI [name] returns for followup. He was hospitalized with pneumonia and CHF and atrial flutter, but could not tolerated amiodarone  and was cardioverted. He has been slow to improve. He has some peripheral edema.  No syncope. He was found to have an elevated LV pacing threshold and returns today for trouble shooting.  Allergies  Allergen Reactions   Ace Inhibitors Swelling and Other (See Comments)    Kidney issues; angioedema per PMH in chart   Penicillins Palpitations     Current Outpatient Medications  Medication Sig Dispense Refill   acetaminophen  (TYLENOL ) 650 MG CR tablet Take 1,300 mg by mouth every 8 (eight) hours as needed for pain.     apixaban  (ELIQUIS ) 2.5 MG TABS tablet Take 1 tablet (2.5 mg total) by mouth 2 (two) times daily. 60 tablet 0   carvedilol  (COREG ) 12.5 MG tablet Take 12.5 mg by mouth 2 (two) times daily with a meal.     dapagliflozin  propanediol (FARXIGA ) 10 MG TABS tablet Take 1 tablet (10 mg total) by mouth daily. 90 tablet 2   gabapentin  (NEURONTIN ) 100 MG capsule Take 3 capsules (300 mg total) by mouth at bedtime. 90 capsule 0   guaiFENesin  (ROBITUSSIN) 100 MG/5ML liquid Take 5 mLs by mouth every 4 (four) hours as needed for cough or to loosen phlegm. 120 mL 0   HUMALOG  KWIKPEN 100 UNIT/ML KwikPen Inject 4-10 Units into the skin 2 (two) times daily. 4 units lunch, 10 units dinner 15 mL 0   insulin  glargine, 1 Unit Dial , (TOUJEO  SOLOSTAR) 300 UNIT/ML Solostar Pen Inject 26 Units into the skin daily. 4.5 mL 0   melatonin 5 MG TABS Take 5 mg by mouth at bedtime.     omeprazole  (PRILOSEC) 40 MG capsule Take 1 capsule (40 mg total) by mouth daily before breakfast. 30 capsule 0   rosuvastatin  (CRESTOR ) 5 MG tablet Take 1 tablet (5 mg total) by mouth daily. 30 tablet 0   torsemide  (DEMADEX ) 20 MG tablet Take 40 mg by mouth daily.     No current facility-administered medications for this visit.     Past Medical History:  Diagnosis Date   Allergy to  ACE inhibitors    Angioedema many years ago; patient has tolerated Losartan  (ARB) in the past - it was held during admisson for GI bleed and worsening renal function >> resume Losartan  25 mg QD 06/2015   Arthritis    Atrial fibrillation (HCC)    PAF, CHADs2Vasc = 5   Carotid artery disease    60-79% LICA   CHF (congestive heart failure) (HCC)    CKD (chronic kidney disease)    Coronary artery disease    Diabetes mellitus    GERD (gastroesophageal reflux disease)    Hyperlipidemia    Hypertension    ICD (implantable cardiac defibrillator) in place 03/28/2012   Biotronik, Dr. Waddell 03/28/12   Ischemic cardiomyopathy    S/P CABG x 3; EF 25%   Mitral regurgitation    S/P mitral valve repair 2013   Myocardial infarction [facility])    Renal insufficiency     ROS:   All systems reviewed and negative except as noted in the HPI.   Past Surgical History:  Procedure Laterality Date   BIV UPGRADE N/A 10/12/2020   Procedure: BIV ICD UPGRADE;  Surgeon: Waddell Danelle ORN, MD;  Location: [facility] INVASIVE CV LAB;  Service: Cardiovascular;  Laterality: N/A;   CARDIOVERSION N/A 04/01/2024   Procedure: CARDIOVERSION;  Surgeon: Lonni Slain,  MD;  Location: [facility] INVASIVE CV LAB;  Service: Cardiovascular;  Laterality: N/A;   CHOLECYSTECTOMY N/A 09/20/2017   Procedure: LAPAROSCOPIC SUBTOTAL CHOLECYSTECTOMY;  Surgeon: Rubin Calamity, MD;  Location: [facility] OR;  Service: General;  Laterality: N/A;   COLONOSCOPY N/A 04/24/2014   RMR Pancolonic diverticulosis   CORONARY ARTERY BYPASS GRAFT  10/17/2011   Procedure: CORONARY ARTERY BYPASS GRAFTING (CABG);  Surgeon: Sudie VEAR Laine, MD;  Location[facility] OR;  Service: Open Heart Surgery;  Laterality: N/A;   ERCP N/A 09/29/2017   Procedure: ENDOSCOPIC RETROGRADE CHOLANGIOPANCREATOGRAPHY (ERCP);  Surgeon: Rollin Dover, MD;  Location: [facility] [place] ENDOSCOPY;  Service: Endoscopy;  Laterality: N/A;   ESOPHAGOGASTRODUODENOSCOPY N/A 04/24/2014   RMR Subtle nodularity the gastric mucosa of  uncertain significance-status post biopsy. Hiatal hernia. chronic inflammation, no H.pylori   ESOPHAGOGASTRODUODENOSCOPY N/A 06/24/2016   Dr. Shaaron: normal esophagus, small hiatal hernia, normal duodenum   ICD  03/28/2012   IMPLANTABLE CARDIOVERTER DEFIBRILLATOR IMPLANT N/A 03/28/2012   Procedure: IMPLANTABLE CARDIOVERTER DEFIBRILLATOR IMPLANT;  Surgeon: Danelle LELON Birmingham, MD;  Location: [facility] CATH LAB;  Service: Cardiovascular;  Laterality: N/A;   KNEE ARTHROSCOPY  ~ 2008   right   LEFT AND RIGHT HEART CATHETERIZATION WITH CORONARY ANGIOGRAM N/A 10/12/2011   Procedure: LEFT AND RIGHT HEART CATHETERIZATION WITH CORONARY ANGIOGRAM;  Surgeon: Deatrice DELENA Cage, MD;  Location: [facility] CATH LAB;  Service: Cardiovascular;  Laterality: N/A;   MITRAL VALVE REPAIR  10/17/2011   Procedure: MITRAL VALVE REPAIR (MVR);  Surgeon: Sudie VEAR Laine, MD;  Location: [facility] OR;  Service: Open Heart Surgery;  Laterality: N/A;   NASAL SINUS SURGERY  1990's   right     Family History  Problem Relation Age of Onset   Heart disease Mother    Diabetes Father    Cardiomyopathy Father    Colon cancer Neg Hx      Social History   Socioeconomic History   Marital status: Married    Spouse name: Not on file   Number of children: Not on file   Years of education: Not on file   Highest education level: Some college, no degree  Occupational History   Occupation: Retired  Tobacco Use   Smoking status: Never   Smokeless tobacco: Never  Vaping Use   Vaping status: Never Used  Substance and Sexual Activity   Alcohol use: No    Alcohol/week: 0.0 standard drinks of alcohol   Drug use: No   Sexual activity: Yes  Other Topics Concern   Not on file  Social History Narrative   Not on file   Social Drivers of Health   Financial Resource Strain: Medium Risk (09/05/2022)   Received from [facility]   Overall Financial Resource Strain (CARDIA)    Difficulty of Paying Living Expenses: Somewhat hard  Food Insecurity: No Food  Insecurity (04/16/2024)   Hunger Vital Sign    Worried About Running Out of Food in the Last Year: Never true    Ran Out of Food in the Last Year: Never true  Transportation Needs: No Transportation Needs (04/16/2024)   PRAPARE - Administrator, Civil Service (Medical): No    Lack of Transportation (Non-Medical): No  Physical Activity: Unknown (09/05/2022)   Received from [facility]   Exercise Vital Sign    On average, how many days per week do you engage in moderate to strenuous exercise (like a brisk walk)?: Patient declined    Minutes of Exercise per Session: Not on file  Stress: No Stress  Concern Present (09/05/2022)   Received from [facility] - Occupational Stress Questionnaire    Feeling of Stress : Only a little  Social Connections: Socially Integrated (04/16/2024)   Social Connection and Isolation Panel    Frequency of Communication with Friends and Family: Twice a week    Frequency of Social Gatherings with Friends and Family: Twice a week    Attends Religious Services: 1 to 4 times per year    Active Member of [employer] or Organizations: No    Attends Engineer, structural: 1 to 4 times per year    Marital Status: Married  Recent Concern: Social Connections - Moderately Isolated (03/29/2024)   Social Connection and Isolation Panel    Frequency of Communication with Friends and Family: More than three times a week    Frequency of Social Gatherings with Friends and Family: Twice a week    Attends Religious Services: Never    Database administrator or Organizations: No    Attends Banker Meetings: Never    Marital Status: Married  Catering manager Violence: Not At Risk (04/16/2024)   Humiliation, Afraid, Rape, and Kick questionnaire    Fear of Current or Ex-Partner: No    Emotionally Abused: No    Physically Abused: No    Sexually Abused: No     BP 122/72   Pulse 73   Ht 6' 4 (1.93 m)   Wt 236 lb  11.2 oz (107.4 kg)   SpO2 91%   BMI 28.81 kg/m   Physical Exam:  Well appearing NAD HEENT: Unremarkable Neck:  No JVD, no thyromegally Lymphatics:  No adenopathy Back:  No CVA tenderness Lungs:  Clear HEART:  Regular rate rhythm, no murmurs, no rubs, no clicks Abd:  soft, positive bowel sounds, no organomegally, no rebound, no guarding Ext:  2 plus pulses, no edema, no cyanosis, no clubbing Skin:  No rashes no nodules Neuro:  CN II through XII intact, motor grossly intact  DEVICE  Normal device function.  See PaceArt for details.   Assess/Plan: Chronic systolic heart failure - his symptoms are class 2. He is encouraged to avoid salty foods. He will contintue GDMT. Pneumonia - he has a little bit of egophony in his right base and a small effusion on CXR but no infectious symptoms. We will follow. ICD - his device was reprogrammed. Also he has good capture LV 1- to RV.   Danelle Austen Oyster,MD

## 2024-06-03 ENCOUNTER — Encounter (HOSPITAL_COMMUNITY)

## 2024-06-27 ENCOUNTER — Telehealth: Payer: Self-pay | Admitting: Nurse Practitioner

## 2024-06-27 DIAGNOSIS — I5020 Unspecified systolic (congestive) heart failure: Secondary | ICD-10-CM

## 2024-06-27 DIAGNOSIS — Z79899 Other long term (current) drug therapy: Secondary | ICD-10-CM

## 2024-06-27 DIAGNOSIS — I5022 Chronic systolic (congestive) heart failure: Secondary | ICD-10-CM

## 2024-06-27 NOTE — Therapy (Incomplete)
 OUTPATIENT PHYSICAL THERAPY VESTIBULAR EVALUATION     Patient Name: Wayne Gordon MRN: [id number] DOB:12-Feb-1934, 89 y.o., male Today's Date: 06/27/2024  END OF SESSION:   Past Medical History:  Diagnosis Date   Allergy to ACE inhibitors    Angioedema many years ago; patient has tolerated Losartan  (ARB) in the past - it was held during admisson for GI bleed and worsening renal function >> resume Losartan  25 mg QD 06/2015   Arthritis    Atrial fibrillation (HCC)    PAF, CHADs2Vasc = 5   Carotid artery disease    60-79% LICA   CHF (congestive heart failure) (HCC)    CKD (chronic kidney disease)    Coronary artery disease    Diabetes mellitus    GERD (gastroesophageal reflux disease)    Hyperlipidemia    Hypertension    ICD (implantable cardiac defibrillator) in place 03/28/2012   [place], Dr. Waddell 03/28/12   Ischemic cardiomyopathy    S/P CABG x 3; EF 25%   Mitral regurgitation    S/P mitral valve repair 2013   Myocardial infarction [facility])    Renal insufficiency    Past Surgical History:  Procedure Laterality Date   BIV UPGRADE N/A 10/12/2020   Procedure: BIV ICD UPGRADE;  Surgeon: Waddell Danelle ORN, MD;  Location: [facility] INVASIVE CV LAB;  Service: Cardiovascular;  Laterality: N/A;   CARDIOVERSION N/A 04/01/2024   Procedure: CARDIOVERSION;  Surgeon: Lonni Slain, MD;  Location: [facility] INVASIVE CV LAB;  Service: Cardiovascular;  Laterality: N/A;   CHOLECYSTECTOMY N/A 09/20/2017   Procedure: LAPAROSCOPIC SUBTOTAL CHOLECYSTECTOMY;  Surgeon: Rubin Calamity, MD;  Location: [facility] OR;  Service: General;  Laterality: N/A;   COLONOSCOPY N/A 04/24/2014   RMR Pancolonic diverticulosis   CORONARY ARTERY BYPASS GRAFT  10/17/2011   Procedure: CORONARY ARTERY BYPASS GRAFTING (CABG);  Surgeon: Sudie VEAR Laine, MD;  Location: [facility] OR;  Service: Open Heart Surgery;  Laterality: N/A;   ERCP N/A 09/29/2017   Procedure: ENDOSCOPIC RETROGRADE CHOLANGIOPANCREATOGRAPHY (ERCP);  Surgeon: Rollin Dover,  MD;  Location: [facility] ENDOSCOPY;  Service: Endoscopy;  Laterality: N/A;   ESOPHAGOGASTRODUODENOSCOPY N/A 04/24/2014   RMR Subtle nodularity the gastric mucosa of uncertain significance-status post biopsy. Hiatal hernia. chronic inflammation, no H.pylori   ESOPHAGOGASTRODUODENOSCOPY N/A 06/24/2016   Dr. Shaaron: normal esophagus, small hiatal hernia, normal duodenum   ICD  03/28/2012   IMPLANTABLE CARDIOVERTER DEFIBRILLATOR IMPLANT N/A 03/28/2012   Procedure: IMPLANTABLE CARDIOVERTER DEFIBRILLATOR IMPLANT;  Surgeon: Danelle ORN Waddell, MD;  Location: [facility] CATH LAB;  Service: Cardiovascular;  Laterality: N/A;   KNEE ARTHROSCOPY  ~ 2008   right   LEFT AND RIGHT HEART CATHETERIZATION WITH CORONARY ANGIOGRAM N/A 10/12/2011   Procedure: LEFT AND RIGHT HEART CATHETERIZATION WITH CORONARY ANGIOGRAM;  Surgeon: Deatrice DELENA Cage, MD;  Location: [facility] CATH LAB;  Service: Cardiovascular;  Laterality: N/A;   MITRAL VALVE REPAIR  10/17/2011   Procedure: MITRAL VALVE REPAIR (MVR);  Surgeon: Sudie VEAR Laine, MD;  Location: [facility] OR;  Service: Open Heart Surgery;  Laterality: N/A;   NASAL SINUS SURGERY  1990's   right   Patient Active Problem List   Diagnosis Date Noted   Hypertension associated with stage 4 chronic kidney disease due to type 2 diabetes mellitus (HCC) 04/30/2024   Hyperlipidemia associated with type 2 diabetes mellitus (HCC) 04/30/2024   Diabetic peripheral neuropathy (HCC) 04/30/2024   CKD stage 4 due to type 2 diabetes mellitus (HCC) 04/30/2024   DM (diabetes mellitus), type 2 with peripheral vascular complications (HCC) 04/27/2024  Neurocognitive deficits 04/27/2024   Lobar pneumonia 04/23/2024   Acute metabolic encephalopathy 04/22/2024   Acute renal failure superimposed on stage 4 chronic kidney disease (HCC) 04/17/2024   Acute on chronic HFrEF (heart failure with reduced ejection fraction) (HCC) 04/17/2024   Acute HFrEF (heart failure with reduced ejection fraction) (HCC) 04/16/2024   Chronic kidney  disease (CKD), stage IV (severe) (HCC) 04/16/2024   Abnormal weight gain 04/16/2024   Hypokalemia 04/16/2024   Atrial flutter (HCC) 04/01/2024   Acute CHF (congestive heart failure) (HCC) 03/29/2024   Human metapneumovirus pneumonia 10/16/2021   Acute on chronic systolic CHF (congestive heart failure) (HCC) 10/16/2021   CHF exacerbation (HCC) 10/15/2021   CAD (coronary artery disease) of artery bypass graft 10/15/2021   History of implantable cardioverter-defibrillator (ICD) placement 10/15/2021   Benign essential HTN 10/15/2021   Chronic kidney disease, stage 3b (HCC) 10/15/2021   Acute respiratory failure with hypoxia (HCC) 10/14/2021   Bloating 02/27/2020   Nausea with vomiting 02/27/2020   Abdominal pain 02/27/2020   Iron deficiency anemia 02/08/2018   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   Bacteremia due to Klebsiella pneumoniae 09/22/2017   Sepsis due to Klebsiella pneumoniae (HCC) 09/22/2017   Cholangitis (HCC) 09/17/2017   Leukocytosis 09/17/2017   Cholelithiasis 09/17/2017   Elevated liver enzymes 09/17/2017   Acute renal failure superimposed on stage 3 chronic kidney disease (HCC) 09/17/2017   Low blood pressure reading 09/17/2017   Lactic acidosis 09/17/2017   Elevated troponin 09/17/2017   Nausea without vomiting 01/10/2017   Gastrointestinal hemorrhage associated with intestinal diverticulosis 06/23/2016   Carotid artery disease    Syncope and collapse 07/11/2015   Ventricular arrhythmia 07/10/2015   Complete heart block (HCC)    ICD (implantable cardioverter-defibrillator) discharge    Atrial fibrillation (HCC) 06/04/2015   Gonadotropin deficiency 06/02/2015   GERD (gastroesophageal reflux disease) 02/18/2015   Abdominal pain, epigastric 02/18/2015   Pleural effusion 09/01/2014   Rectal bleeding 03/25/2014   Acute blood loss anemia 03/25/2014   Adult body mass index 28.0-28.9 09/30/2013   Diabetes mellitus, type 2 (HCC) 08/09/2013   PVC's (premature  ventricular contractions) 07/04/2013   Hyperlipidemia 01/03/2013   Automatic implantable cardioverter-defibrillator in situ 03/30/2012   S/P MVR (mitral valve repair) 01/16/2012   HTN (hypertension) 11/28/2011   Chronic systolic heart failure (HCC) 10/31/2011   Ischemic cardiomyopathy 10/31/2011   CKD (chronic kidney disease) stage 3, GFR 30-59 ml/min (HCC) 10/31/2011   Current use of long term anticoagulation 10/31/2011   Long term (current) use of anticoagulants 10/28/2011   S/P mitral valve repair 10/17/2011   S/P CABG x 3 10/17/2011   Mitral regurgitation 10/12/2011   NSTEMI (non-ST elevated myocardial infarction) (HCC) 10/11/2011    PCP: Renato Dorothey HERO, NP  REFERRING PROVIDER: Jearlean Golden Pillow, MD  REFERRING DIAG: R42 (ICD-10-CM) - Vertigo  THERAPY DIAG:  No diagnosis found.  ONSET DATE: ***  Rationale for Evaluation and Treatment: Rehabilitation  SUBJECTIVE:   SUBJECTIVE STATEMENT: *** Pt accompanied by: {accompnied:27141}  PERTINENT HISTORY: ***  PAIN:  Are you having pain? {OPRCPAIN:27236}  PRECAUTIONS: {Therapy precautions:24002}  RED FLAGS: {PT Red Flags:29287}   WEIGHT BEARING RESTRICTIONS: {Yes ***/No:24003}  FALLS: Has patient fallen in last 6 months? {fallsyesno:27318}  LIVING ENVIRONMENT: Lives with: {OPRC lives with:25569::lives with their family} Lives in: {Lives in:25570} Stairs: {opstairs:27293} Has following equipment at home: {Assistive devices:23999}  PLOF: {PLOF:24004}  PATIENT GOALS: ***  OBJECTIVE:  Note: Objective measures were completed at Evaluation unless otherwise noted.  DIAGNOSTIC FINDINGS: CLINICAL DATA:  Initial evaluation for mental status change, unknown cause.   EXAM: CT HEAD WITHOUT CONTRAST   TECHNIQUE: Contiguous axial images were obtained from the base of the skull through the vertex without intravenous contrast.   RADIATION DOSE REDUCTION: This exam was performed according to  the departmental dose-optimization program which includes automated exposure control, adjustment of the mA and/or kV according to patient size and/or use of iterative reconstruction technique.   COMPARISON:  CT from 07/10/2015.   FINDINGS: Brain: Cerebral volume within normal limits. Mild chronic microvascular ischemic disease for age. Few small remote cerebellar infarcts noted bilaterally. No acute intracranial hemorrhage. No acute large vessel territory infarct. No mass lesion, midline shift or mass effect. No hydrocephalus or extra-axial fluid collection.   Vascular: No abnormal hyperdense vessel. Calcified atherosclerosis present at the skull base.   Skull: Scalp soft tissues demonstrate no acute abnormality. Calvarium intact.   Sinuses/Orbits: Globes and orbital soft tissues within normal limits. 2.2 cm ovoid expansile lesion seen adjacent to the left middle turbinate, which could reflect a mucocele or possibly polyp (series 3, image 26). Paranasal sinuses are otherwise largely clear. Trace chronic appearing right mastoid effusion noted.   Other: None.   IMPRESSION: 1. No acute intracranial abnormality. 2. Mild chronic microvascular ischemic disease with a few small remote cerebellar infarcts. 3. 2.2 cm ovoid expansile lesion adjacent to the left middle turbinate, which could reflect a mucocele or possibly polyp. Correlation with physical exam recommended.  COGNITION: Overall cognitive status: {cognition:24006}   SENSATION: {sensation:27233}  EDEMA:  {edema:24020}  MUSCLE TONE:  {LE tone:25568}  DTRs:  {DTR SITE:24025}  POSTURE:  {posture:25561}  Cervical ROM:    {AROM/PROM:27142} A/PROM (deg) eval  Flexion   Extension   Right lateral flexion   Left lateral flexion   Right rotation   Left rotation   (Blank rows = not tested)  STRENGTH: ***  LOWER EXTREMITY MMT:   MMT Right eval Left eval  Hip flexion    Hip abduction    Hip adduction     Hip internal rotation    Hip external rotation    Knee flexion    Knee extension    Ankle dorsiflexion    Ankle plantarflexion    Ankle inversion    Ankle eversion    (Blank rows = not tested)  BED MOBILITY:  {Bed mobility:24027}  TRANSFERS: Assistive device utilized: {Assistive devices:23999}  Sit to stand: {Levels of assistance:24026} Stand to sit: {Levels of assistance:24026} Chair to chair: {Levels of assistance:24026} Floor: {Levels of assistance:24026}  RAMP: {Levels of assistance:24026}  CURB: {Levels of assistance:24026}  GAIT: Gait pattern: {gait characteristics:25376} Distance walked: *** Assistive device utilized: {Assistive devices:23999} Level of assistance: {Levels of assistance:24026} Comments: ***  FUNCTIONAL TESTS:  {Functional tests:24029}  PATIENT SURVEYS:  {rehab surveys:24030}  VESTIBULAR ASSESSMENT:  GENERAL OBSERVATION: ***   SYMPTOM BEHAVIOR:  Subjective history: ***  Non-Vestibular symptoms: {nonvestibular symptoms:25260}  Type of dizziness: {Type of Dizziness:25255}  Frequency: ***  Duration: ***  Aggravating factors: {Aggravating Factors:25258}  Relieving factors: {Relieving Factors:25259}  Progression of symptoms: {DESC; BETTER/WORSE:18575}  OCULOMOTOR EXAM:  Ocular Alignment: {Ocular Alignment:25262}  Ocular ROM: {RANGE OF MOTION:21649}  Spontaneous Nystagmus: {Spontaneous nystagmus:25263}  Gaze-Induced Nystagmus: {gaze-induced nystagmus:25264}  Smooth Pursuits: {smooth pursuit:25265}  Saccades: {saccades:25266}  Convergence/Divergence: *** cm   Cover-cross-cover test: {cover test:33756}    VESTIBULAR - OCULAR REFLEX:   Slow VOR: {slow VOR:25290}  VOR Cancellation: {vor cancellation:25291}  Head-Impulse Test: {head impulse test:25272}  Dynamic Visual Acuity: {dynamic visual acuity:25273}  POSITIONAL TESTING: {Positional tests:25271}  MOTION SENSITIVITY:  Motion Sensitivity Quotient Intensity: 0 = none, 1 =  Lightheaded, 2 = Mild, 3 = Moderate, 4 = Severe, 5 = Vomiting  Intensity  1. Sitting to supine   2. Supine to L side   3. Supine to R side   4. Supine to sitting   5. L Hallpike-Dix   6. Up from L    7. R Hallpike-Dix   8. Up from R    9. Sitting, head tipped to L knee   10. Head up from L knee   11. Sitting, head tipped to R knee   12. Head up from R knee   13. Sitting head turns x5   14.Sitting head nods x5   15. In stance, 180 turn to L    16. In stance, 180 turn to R     OTHOSTATICS: {Exam; orthostatics:31331}  FUNCTIONAL GAIT: {Functional tests:24029}                                                                                                                             TREATMENT DATE:  06/27/2024  Vitals: BP: HR: O2 sat: Evaluation: -ROM measured, Strength assessed, HEP prescribed, pt educated on prognosis, findings, and importance of HEP compliance if given.  Manual Therapy: -CPA of Lumbar Spinal segments L3-L5, grade II-III mobilizations -STM of Lumbar Paraspinal musculature  Therapeutic Exercise: -Supine bridges 2 sets of 10 reps, 3 second holds, symptomatic, pt cued for max hip extension -Standing 3 way hip 1 sets 10 reps, bilaterally, pt cued for upright trunk and maintaining of neutral spine -Lateral stepping 3 laps 20 feet per lap, second 2 with RTB around ankles, pt cued for upright posture -Forward lunges, 1 set of 5 reps better performance going into RLE, pt cued for core activation and upright posture      Canalith Repositioning:  {Canalith Repositioning:25283} Gaze Adaptation:  {gaze adaptation:25286} Habituation:  {habituation:25288} Other: ***  PATIENT EDUCATION: Education details: Pt was educated on findings of PT evaluation, prognosis, frequency of therapy visits and rationale, attendance policy, and HEP if given.   Person educated: {Person educated:25204} Education method: {Education Method:25205} Education comprehension:  {Education Comprehension:25206}  HOME EXERCISE PROGRAM:  GOALS: Goals reviewed with patient? {yes/no:20286}  SHORT TERM GOALS: Target date: {follow up:25551}  Pt will be independent with home exercise program in order to improve balance and decrease dizziness symptoms in order to decrease fall risk and improve function at home and work. Baseline: *** Goal status: {GOALSTATUS:25110}  2.  *** Baseline: *** Goal status: {GOALSTATUS:25110}  LONG TERM GOALS: Target date: {follow up:25551}  Patient will have improved FOTO score of 6 points or greater in order to demonstrate improvements in patient's ADLs and functional performance.  Baseline: *** Goal status: {GOALSTATUS:25110}  2.  Patient will demonstrate reduced falls risk as evidenced by Dynamic Gait Index (DGI) >19/24. Baseline: *** Goal status: {GOALSTATUS:25110}  3.  Patient will reduce falls risk  as indicated by Activities Specific Balance Confidence Scale (ABC) >67%.      Patient will have demonstrate decreased falls risk as indicated by Activities Specific Balance Confidence Scale score of 80% or greater. Baseline: *** Goal status: {GOALSTATUS:25110}  4.  Patient will reduce perceived disability to moderate levels as indicated by <70 on Dizziness Handicap Inventory [facility]).      Patient will reduce perceived disability to low levels as indicated by <40 on Dizziness Handicap Inventory. Baseline: *** Goal status: {GOALSTATUS:25110}  5.  Patient will report 50% or better improvement in their dizziness and imbalance symptoms overall in order for patient to be able to perform ADLs and resume prior activities. Baseline: *** Goal status: {GOALSTATUS:25110}  6.  *** Baseline: *** Goal status: {GOALSTATUS:25110}   ASSESSMENT:  CLINICAL IMPRESSION: Patient is a 89 y.o. male who was seen today for physical therapy evaluation and treatment for R42 (ICD-10-CM) - Vertigo.   Patient demonstrates decreased LE strength, abnormal  pain rating, and impaired balance. Patient also demonstrates difficulty with ambulation during today's session with decreased stride length and velocity noted. Patient also demonstrates ***. Patient requires ***. Patient would benefit from skilled physical therapy for increased endurance with ambulation, increased LE strength, and balance for improved gait quality, return to higher level of function with ADLs, and progress towards therapy goals.   OBJECTIVE IMPAIRMENTS: {opptimpairments:25111}.   ACTIVITY LIMITATIONS: {activitylimitations:27494}  PARTICIPATION LIMITATIONS: {participationrestrictions:25113}  PERSONAL FACTORS: {Personal factors:25162} are also affecting patient's functional outcome.   REHAB POTENTIAL: {rehabpotential:25112}  CLINICAL DECISION MAKING: {clinical decision making:25114}  EVALUATION COMPLEXITY: {Evaluation complexity:25115}   PLAN:  PT FREQUENCY: {rehab frequency:25116}  PT DURATION: {rehab duration:25117}  PLANNED INTERVENTIONS: {rehab planned interventions:25118::97110-Therapeutic exercises,97530- Therapeutic [phone]- Neuromuscular re-education,97535- Self Rjmz,02859- Manual therapy,Patient/Family education}  PLAN FOR NEXT SESSION: ***   Lang Ada, PT, DPT [facility] Office: [phone] 4:16 PM, 06/27/24

## 2024-06-27 NOTE — Telephone Encounter (Signed)
 Pt states that he has gained 1 pound daily since Sunday or Monday. Pt states that he has increased Torsemide  to 60 mg daily. He reports that he is not voiding as much. He reports daily weights as 233.6, 235.6, 236.4, 237.4, 237.8, 239.2. Pt denies being SOB. States that ankles and feet are swollen but have been since coming home from the hospital. Please advise.

## 2024-06-27 NOTE — Telephone Encounter (Signed)
 Pt c/o medication issue:  1. Name of Medication: torsemide  (DEMADEX ) 20 MG tablet   2. How are you currently taking this medication (dosage and times per day)?  Take 40 mg by mouth daily.     3. Are you having a reaction (difficulty breathing--STAT)? No  4. What is your medication issue? Pt is requesting a callback regarding him feeling this medication isn't working for him. Please advise

## 2024-06-28 ENCOUNTER — Encounter (HOSPITAL_COMMUNITY): Payer: Self-pay

## 2024-06-28 ENCOUNTER — Encounter (HOSPITAL_COMMUNITY)

## 2024-07-01 NOTE — Telephone Encounter (Signed)
 Reports all weights reported last week were early morning after voiding. Reports seeing his endocrinologist (Dr. Hosie at [facility]) this morning and had labs drawn. 06/29/2024 239 lbs 06/30/2024 241 lbs & took torsemide  80 mg 07/01/2024 240 lbs & took torsemide  80 mg  Reports no change in diet Report swelling in ankles and feet Advised to take torsemide  80 mg daily for a few days as suggested by Mallipeddi and will contact him if we are not able to have BNP added on.  Verbalized understanding.

## 2024-07-01 NOTE — Telephone Encounter (Signed)
 Contacted [facility], spoke with [facility] and confirmed that CMP and Lipid lab work drawn this morning.   Contacted [facility] to add BNP to labs drawn this morning by endocrinology and was unable to add BNP due to the collected specimen not being frozen.   Patient aware that BNP will have to be collected and will have this done at [facility] in [place].

## 2024-07-03 LAB — BRAIN NATRIURETIC PEPTIDE: BNP: 663.4 pg/mL — ABNORMAL HIGH (ref 0.0–100.0)

## 2024-07-08 ENCOUNTER — Telehealth: Payer: Self-pay | Admitting: Internal Medicine

## 2024-07-08 DIAGNOSIS — Z79899 Other long term (current) drug therapy: Secondary | ICD-10-CM

## 2024-07-08 DIAGNOSIS — I5022 Chronic systolic (congestive) heart failure: Secondary | ICD-10-CM

## 2024-07-08 NOTE — Telephone Encounter (Signed)
 Pt c/o swelling: STAT is pt has developed SOB within 24 hours  How much weight have you gained and in what time span? Pt stated he weighed 237 and now he's 243 today.   If swelling, where is the swelling located? Feet, ankles and up both legs   Are you currently taking a fluid pill? Yes  Are you currently SOB? No  Do you have a log of your daily weights (if so, list)? No  Have you gained 3 pounds in a day or 5 pounds in a week? Yes  Have you traveled recently? No

## 2024-07-08 NOTE — Telephone Encounter (Signed)
 Patient informed and verbalized understanding of plan.

## 2024-07-08 NOTE — Telephone Encounter (Signed)
 Reports his weight has gradually increased since early November 2025.  Reports on 06/29/2024 his weight was 237 lbs.  Denies no change in his diet or increasing sodium or fluids. Denies recent travel. Reports swelling continues in feet, legs and ankles. Reports SOB with activity. Reports he is able to sleep laying down in the bed.  Denies dizziness or chest pain. Reports taking torsemide  80 mg daily since last week and has been unable to get weight down. Reports baseline weight is around 235 lbs and today's weight is 243 lbs. Recent BNP done. Advised that message would be sent to provider for recommendations. ER precautions provided for worsening symptoms. Verbalized understanding.

## 2024-07-08 NOTE — Telephone Encounter (Signed)
 SABRA

## 2024-07-10 ENCOUNTER — Ambulatory Visit: Payer: Self-pay | Admitting: Internal Medicine

## 2024-07-10 NOTE — Telephone Encounter (Signed)
-----   Message from Vishnu P Mallipeddi sent at 07/10/2024  1:25 PM EST ----- BNP 600s. Volume overloaded. Agree with increasing torsemide  from 20 mg to 40 mg once daily. Needs to be seen sooner than dec. ----- Message ----- From: Interface, [facility] Lab Results In Sent: 07/03/2024   3:36 PM EST To: Vishnu P Mallipeddi, MD

## 2024-07-10 NOTE — Telephone Encounter (Signed)
 The patient has been notified of the result and verbalized understanding.  All questions (if any) were answered. Patient will be having labs completed in the morning and will reach out if we have a sooner appointment  Littie CHRISTELLA Croak, CMA 07/10/2024 3:40 PM

## 2024-07-12 LAB — BASIC METABOLIC PANEL WITH GFR
BUN/Creatinine Ratio: 11 (ref 10–24)
BUN: 25 mg/dL (ref 8–27)
CO2: 29 mmol/L (ref 20–29)
Calcium: 8.8 mg/dL (ref 8.6–10.2)
Chloride: 97 mmol/L (ref 96–106)
Creatinine, Ser: 2.34 mg/dL — ABNORMAL HIGH (ref 0.76–1.27)
Glucose: 124 mg/dL — ABNORMAL HIGH (ref 70–99)
Potassium: 4 mmol/L (ref 3.5–5.2)
Sodium: 140 mmol/L (ref 134–144)
eGFR: 26 mL/min/1.73 — ABNORMAL LOW (ref >59)

## 2024-07-15 NOTE — Telephone Encounter (Signed)
 Bilateral supartz estimate of $107.02. patient scheduled for 08/07/2024. Please order on 07/29/2024.

## 2024-07-16 ENCOUNTER — Encounter (HOSPITAL_COMMUNITY): Payer: Self-pay

## 2024-07-16 ENCOUNTER — Other Ambulatory Visit: Payer: Self-pay

## 2024-07-16 ENCOUNTER — Emergency Department (HOSPITAL_COMMUNITY)

## 2024-07-16 ENCOUNTER — Encounter: Payer: Self-pay | Admitting: Internal Medicine

## 2024-07-16 ENCOUNTER — Inpatient Hospital Stay (HOSPITAL_COMMUNITY)
Admission: EM | Admit: 2024-07-16 | Discharge: 2024-07-23 | DRG: 291 | Disposition: A | Discharge status: Home Health | Attending: Internal Medicine | Admitting: Internal Medicine

## 2024-07-16 ENCOUNTER — Ambulatory Visit: Payer: Self-pay | Admitting: Nurse Practitioner

## 2024-07-16 ENCOUNTER — Ambulatory Visit: Attending: Internal Medicine | Admitting: Internal Medicine

## 2024-07-16 VITALS — BP 118/70 | HR 69 | Ht 76.0 in | Wt 254.0 lb

## 2024-07-16 DIAGNOSIS — I2489 Other forms of acute ischemic heart disease: Secondary | ICD-10-CM | POA: Diagnosis present

## 2024-07-16 DIAGNOSIS — Z515 Encounter for palliative care: Secondary | ICD-10-CM

## 2024-07-16 DIAGNOSIS — I4819 Other persistent atrial fibrillation: Secondary | ICD-10-CM | POA: Diagnosis present

## 2024-07-16 DIAGNOSIS — Z952 Presence of prosthetic heart valve: Secondary | ICD-10-CM | POA: Diagnosis not present

## 2024-07-16 DIAGNOSIS — N179 Acute kidney failure, unspecified: Secondary | ICD-10-CM | POA: Diagnosis present

## 2024-07-16 DIAGNOSIS — I5082 Biventricular heart failure: Secondary | ICD-10-CM | POA: Diagnosis present

## 2024-07-16 DIAGNOSIS — E66811 Obesity, class 1: Secondary | ICD-10-CM | POA: Diagnosis present

## 2024-07-16 DIAGNOSIS — Z951 Presence of aortocoronary bypass graft: Secondary | ICD-10-CM

## 2024-07-16 DIAGNOSIS — I4729 Other ventricular tachycardia: Secondary | ICD-10-CM | POA: Diagnosis not present

## 2024-07-16 DIAGNOSIS — I509 Heart failure, unspecified: Principal | ICD-10-CM

## 2024-07-16 DIAGNOSIS — Z8249 Family history of ischemic heart disease and other diseases of the circulatory system: Secondary | ICD-10-CM | POA: Diagnosis not present

## 2024-07-16 DIAGNOSIS — E1165 Type 2 diabetes mellitus with hyperglycemia: Secondary | ICD-10-CM | POA: Diagnosis present

## 2024-07-16 DIAGNOSIS — I2510 Atherosclerotic heart disease of native coronary artery without angina pectoris: Secondary | ICD-10-CM | POA: Diagnosis present

## 2024-07-16 DIAGNOSIS — Z95810 Presence of automatic (implantable) cardiac defibrillator: Secondary | ICD-10-CM

## 2024-07-16 DIAGNOSIS — Z880 Allergy status to penicillin: Secondary | ICD-10-CM

## 2024-07-16 DIAGNOSIS — I252 Old myocardial infarction: Secondary | ICD-10-CM

## 2024-07-16 DIAGNOSIS — I495 Sick sinus syndrome: Secondary | ICD-10-CM | POA: Diagnosis present

## 2024-07-16 DIAGNOSIS — N1832 Chronic kidney disease, stage 3b: Secondary | ICD-10-CM

## 2024-07-16 DIAGNOSIS — I5023 Acute on chronic systolic (congestive) heart failure: Secondary | ICD-10-CM | POA: Diagnosis present

## 2024-07-16 DIAGNOSIS — N184 Chronic kidney disease, stage 4 (severe): Secondary | ICD-10-CM | POA: Diagnosis present

## 2024-07-16 DIAGNOSIS — Z59868 Other specified financial insecurity: Secondary | ICD-10-CM

## 2024-07-16 DIAGNOSIS — I493 Ventricular premature depolarization: Secondary | ICD-10-CM | POA: Diagnosis present

## 2024-07-16 DIAGNOSIS — E876 Hypokalemia: Secondary | ICD-10-CM | POA: Diagnosis present

## 2024-07-16 DIAGNOSIS — I5041 Acute combined systolic (congestive) and diastolic (congestive) heart failure: Secondary | ICD-10-CM

## 2024-07-16 DIAGNOSIS — I10 Essential (primary) hypertension: Secondary | ICD-10-CM | POA: Diagnosis not present

## 2024-07-16 DIAGNOSIS — Z794 Long term (current) use of insulin: Secondary | ICD-10-CM | POA: Diagnosis not present

## 2024-07-16 DIAGNOSIS — Z9889 Other specified postprocedural states: Secondary | ICD-10-CM | POA: Diagnosis not present

## 2024-07-16 DIAGNOSIS — I5043 Acute on chronic combined systolic (congestive) and diastolic (congestive) heart failure: Secondary | ICD-10-CM | POA: Diagnosis present

## 2024-07-16 DIAGNOSIS — Z888 Allergy status to other drugs, medicaments and biological substances status: Secondary | ICD-10-CM

## 2024-07-16 DIAGNOSIS — Z6830 Body mass index (BMI) 30.0-30.9, adult: Secondary | ICD-10-CM

## 2024-07-16 DIAGNOSIS — E782 Mixed hyperlipidemia: Secondary | ICD-10-CM | POA: Diagnosis present

## 2024-07-16 DIAGNOSIS — I130 Hypertensive heart and chronic kidney disease with heart failure and stage 1 through stage 4 chronic kidney disease, or unspecified chronic kidney disease: Principal | ICD-10-CM | POA: Diagnosis present

## 2024-07-16 DIAGNOSIS — E7849 Other hyperlipidemia: Secondary | ICD-10-CM

## 2024-07-16 DIAGNOSIS — Z7901 Long term (current) use of anticoagulants: Secondary | ICD-10-CM

## 2024-07-16 DIAGNOSIS — I5021 Acute systolic (congestive) heart failure: Secondary | ICD-10-CM | POA: Diagnosis not present

## 2024-07-16 DIAGNOSIS — Z789 Other specified health status: Secondary | ICD-10-CM | POA: Diagnosis present

## 2024-07-16 DIAGNOSIS — I25810 Atherosclerosis of coronary artery bypass graft(s) without angina pectoris: Secondary | ICD-10-CM | POA: Diagnosis not present

## 2024-07-16 DIAGNOSIS — Z79899 Other long term (current) drug therapy: Secondary | ICD-10-CM | POA: Diagnosis not present

## 2024-07-16 DIAGNOSIS — E785 Hyperlipidemia, unspecified: Secondary | ICD-10-CM | POA: Diagnosis not present

## 2024-07-16 DIAGNOSIS — Z66 Do not resuscitate: Secondary | ICD-10-CM | POA: Diagnosis present

## 2024-07-16 DIAGNOSIS — Z7189 Other specified counseling: Secondary | ICD-10-CM | POA: Diagnosis not present

## 2024-07-16 DIAGNOSIS — J9601 Acute respiratory failure with hypoxia: Secondary | ICD-10-CM | POA: Diagnosis present

## 2024-07-16 DIAGNOSIS — Z833 Family history of diabetes mellitus: Secondary | ICD-10-CM

## 2024-07-16 DIAGNOSIS — R54 Age-related physical debility: Secondary | ICD-10-CM | POA: Diagnosis present

## 2024-07-16 DIAGNOSIS — E1122 Type 2 diabetes mellitus with diabetic chronic kidney disease: Secondary | ICD-10-CM | POA: Diagnosis present

## 2024-07-16 LAB — TROPONIN T, HIGH SENSITIVITY
Troponin T High Sensitivity: 94 ng/L — ABNORMAL HIGH (ref 0–19)
Troponin T High Sensitivity: 85 ng/L — ABNORMAL HIGH (ref 0–19)

## 2024-07-16 LAB — CBC WITH DIFFERENTIAL/PLATELET
Abs Immature Granulocytes: 0.01 K/uL (ref 0.00–0.07)
Basophils Absolute: 0 K/uL (ref 0.0–0.1)
Basophils Relative: 1 %
Eosinophils Absolute: 0.1 K/uL (ref 0.0–0.5)
Eosinophils Relative: 1 %
HCT: 42.7 % (ref 39.0–52.0)
Hemoglobin: 13.6 g/dL (ref 13.0–17.0)
Immature Granulocytes: 0 %
Lymphocytes Relative: 24 %
Lymphs Abs: 1.8 K/uL (ref 0.7–4.0)
MCH: 28 pg (ref 26.0–34.0)
MCHC: 31.9 g/dL (ref 30.0–36.0)
MCV: 88 fL (ref 80.0–100.0)
Monocytes Absolute: 0.8 K/uL (ref 0.1–1.0)
Monocytes Relative: 10 %
Neutro Abs: 4.7 K/uL (ref 1.7–7.7)
Neutrophils Relative %: 64 %
Platelets: 204 K/uL (ref 150–400)
RBC: 4.85 MIL/uL (ref 4.22–5.81)
RDW: 16.3 % — ABNORMAL HIGH (ref 11.5–15.5)
WBC: 7.4 K/uL (ref 4.0–10.5)
nRBC: 0 % (ref 0.0–0.2)

## 2024-07-16 LAB — COMPREHENSIVE METABOLIC PANEL WITH GFR
ALT: 10 U/L (ref 0–44)
AST: 26 U/L (ref 15–41)
Albumin: 4 g/dL (ref 3.5–5.0)
Alkaline Phosphatase: 89 U/L (ref 38–126)
Anion gap: 13 (ref 5–15)
BUN: 31 mg/dL — ABNORMAL HIGH (ref 8–23)
CO2: 30 mmol/L (ref 22–32)
Calcium: 8.9 mg/dL (ref 8.9–10.3)
Chloride: 96 mmol/L — ABNORMAL LOW (ref 98–111)
Creatinine, Ser: 2.4 mg/dL — ABNORMAL HIGH (ref 0.61–1.24)
GFR, Estimated: 25 mL/min — ABNORMAL LOW (ref >60)
Glucose, Bld: 128 mg/dL — ABNORMAL HIGH (ref 70–99)
Potassium: 3.1 mmol/L — ABNORMAL LOW (ref 3.5–5.1)
Sodium: 139 mmol/L (ref 135–145)
Total Bilirubin: 1.3 mg/dL — ABNORMAL HIGH (ref 0.0–1.2)
Total Protein: 6.7 g/dL (ref 6.5–8.1)

## 2024-07-16 LAB — PRO BRAIN NATRIURETIC PEPTIDE: Pro Brain Natriuretic Peptide: 4561 pg/mL — ABNORMAL HIGH (ref <300.0)

## 2024-07-16 LAB — GLUCOSE, CAPILLARY
Glucose-Capillary: 136 mg/dL — ABNORMAL HIGH (ref 70–99)
Glucose-Capillary: 142 mg/dL — ABNORMAL HIGH (ref 70–99)

## 2024-07-16 LAB — MAGNESIUM: Magnesium: 2.5 mg/dL — ABNORMAL HIGH (ref 1.7–2.4)

## 2024-07-16 MED ORDER — CARVEDILOL 12.5 MG PO TABS
12.5000 mg | ORAL_TABLET | Freq: Two times a day (BID) | ORAL | Status: DC
  Administered 2024-07-16 – 2024-07-23 (×10): 12.5 mg via ORAL
  Filled 2024-07-16 (×14): qty 1

## 2024-07-16 MED ORDER — APIXABAN 2.5 MG PO TABS
2.5000 mg | ORAL_TABLET | Freq: Two times a day (BID) | ORAL | Status: DC
  Administered 2024-07-16 – 2024-07-23 (×14): 2.5 mg via ORAL
  Filled 2024-07-16 (×14): qty 1

## 2024-07-16 MED ORDER — INSULIN ASPART 100 UNIT/ML IJ SOLN
0.0000 [IU] | Freq: Every day | INTRAMUSCULAR | Status: DC
  Administered 2024-07-21: 2 [IU] via SUBCUTANEOUS
  Filled 2024-07-16 (×2): qty 1

## 2024-07-16 MED ORDER — POTASSIUM CHLORIDE CRYS ER 20 MEQ PO TBCR
40.0000 meq | EXTENDED_RELEASE_TABLET | Freq: Once | ORAL | Status: AC
  Administered 2024-07-16: 40 meq via ORAL
  Filled 2024-07-16: qty 2

## 2024-07-16 MED ORDER — PANTOPRAZOLE SODIUM 40 MG PO TBEC
40.0000 mg | DELAYED_RELEASE_TABLET | Freq: Every day | ORAL | Status: DC
  Administered 2024-07-17 – 2024-07-23 (×7): 40 mg via ORAL
  Filled 2024-07-16 (×7): qty 1

## 2024-07-16 MED ORDER — MELATONIN 3 MG PO TABS
6.0000 mg | ORAL_TABLET | Freq: Every day | ORAL | Status: DC
  Administered 2024-07-16 – 2024-07-22 (×7): 6 mg via ORAL
  Filled 2024-07-16 (×7): qty 2

## 2024-07-16 MED ORDER — ONDANSETRON HCL 4 MG/2ML IJ SOLN: 4.0000 mg | Freq: Four times a day (QID) | INTRAMUSCULAR | Status: DC | PRN

## 2024-07-16 MED ORDER — FUROSEMIDE 10 MG/ML IJ SOLN
80.0000 mg | Freq: Two times a day (BID) | INTRAMUSCULAR | Status: DC
  Administered 2024-07-16 – 2024-07-18 (×4): 80 mg via INTRAVENOUS
  Filled 2024-07-16 (×4): qty 8

## 2024-07-16 MED ORDER — ACETAMINOPHEN 500 MG PO TABS: 500.0000 mg | ORAL_TABLET | Freq: Four times a day (QID) | ORAL | Status: DC | PRN

## 2024-07-16 MED ORDER — ROSUVASTATIN CALCIUM 10 MG PO TABS
5.0000 mg | ORAL_TABLET | Freq: Every day | ORAL | Status: DC
  Administered 2024-07-17 – 2024-07-23 (×7): 5 mg via ORAL
  Filled 2024-07-16 (×7): qty 1

## 2024-07-16 MED ORDER — GABAPENTIN 300 MG PO CAPS
300.0000 mg | ORAL_CAPSULE | Freq: Every day | ORAL | Status: DC
  Administered 2024-07-16 – 2024-07-22 (×7): 300 mg via ORAL
  Filled 2024-07-16 (×7): qty 1

## 2024-07-16 MED ORDER — INSULIN ASPART 100 UNIT/ML IJ SOLN
0.0000 [IU] | Freq: Three times a day (TID) | INTRAMUSCULAR | Status: DC
  Administered 2024-07-16: 2 [IU] via SUBCUTANEOUS
  Administered 2024-07-17: 8 [IU] via SUBCUTANEOUS
  Administered 2024-07-17 – 2024-07-18 (×2): 3 [IU] via SUBCUTANEOUS
  Administered 2024-07-18: 2 [IU] via SUBCUTANEOUS
  Administered 2024-07-18: 3 [IU] via SUBCUTANEOUS
  Administered 2024-07-19 (×2): 5 [IU] via SUBCUTANEOUS
  Administered 2024-07-20: 2 [IU] via SUBCUTANEOUS
  Administered 2024-07-20 (×2): 3 [IU] via SUBCUTANEOUS
  Administered 2024-07-21: 8 [IU] via SUBCUTANEOUS
  Administered 2024-07-21: 2 [IU] via SUBCUTANEOUS
  Administered 2024-07-21 – 2024-07-22 (×2): 3 [IU] via SUBCUTANEOUS
  Administered 2024-07-22 (×2): 5 [IU] via SUBCUTANEOUS
  Administered 2024-07-23: 3 [IU] via SUBCUTANEOUS
  Administered 2024-07-23: 2 [IU] via SUBCUTANEOUS
  Filled 2024-07-16 (×20): qty 1

## 2024-07-16 MED ORDER — SODIUM CHLORIDE 0.9% FLUSH
3.0000 mL | Freq: Two times a day (BID) | INTRAVENOUS | Status: DC
  Administered 2024-07-16 – 2024-07-23 (×10): 3 mL via INTRAVENOUS

## 2024-07-16 MED ORDER — SODIUM CHLORIDE 0.9% FLUSH: 3.0000 mL | INTRAVENOUS | Status: DC | PRN

## 2024-07-16 MED ORDER — SODIUM CHLORIDE 0.9 % IV SOLN: 250.0000 mL | INTRAVENOUS | Status: AC | PRN

## 2024-07-16 MED ORDER — INSULIN GLARGINE-YFGN 100 UNIT/ML ~~LOC~~ SOLN
10.0000 [IU] | Freq: Every day | SUBCUTANEOUS | Status: DC
  Administered 2024-07-16 – 2024-07-22 (×7): 10 [IU] via SUBCUTANEOUS
  Filled 2024-07-16 (×8): qty 0.1

## 2024-07-16 MED ORDER — FUROSEMIDE 10 MG/ML IJ SOLN
80.0000 mg | Freq: Once | INTRAMUSCULAR | Status: AC
  Administered 2024-07-16: 80 mg via INTRAVENOUS
  Filled 2024-07-16: qty 8

## 2024-07-16 MED ORDER — ACETAMINOPHEN ER 650 MG PO TBCR: 1300.0000 mg | EXTENDED_RELEASE_TABLET | Freq: Three times a day (TID) | ORAL | Status: DC | PRN

## 2024-07-16 NOTE — H&P (Signed)
 History and Physical    Patient: Wayne Gordon FMW:[id number] DOB: September 20, 1933 DOA: 07/16/2024 DOS: the patient was seen and examined on 07/16/2024 PCP: Renato Dorothey HERO, NP  Patient coming from: Home  Chief Complaint:  Chief Complaint  Patient presents with   fluid retention   HPI: Wayne Gordon is a 89 year old male with HFrEF, CAD status post CABG and mitral valve repair 2013, LVEF 20-25%, status post ICD, pacer placement, stage IV CKD, persistent atrial fibrillation/atrial flutter on chronic anticoagulation with apixaban  status post DCCV 04/01/24, ype 2 diabetes mellitus on insulin , CKD 4 presenting with dyspnea on exertion for about a week.  The patient was at his cardiology office visit today.  He was noted to be fluid overloaded and has gained 18 pounds over the past month.  His office weight was 254 pounds. Patient endorses compliance with his medications.  Notably, the patient had recently had his torsemide  increased from 20 mg daily to 40 mg daily for about a week.  He has continued to have dyspnea on exertion and worsening lower extremity edema and increasing abdominal girth. Patient denies fevers, chills, headache, chest pain,  nausea, vomiting, diarrhea, abdominal pain, dysuria, hematuria, hematochezia, and melena. In the ED, the patient was afebrile and hemodynamically stable with oxygen  saturation 92% on room air.  WBC 7.4, hemoglobin 13.6, platelets 204.  Sodium 139, potassium 3.1, bicarbonate 30, serum creatinine 2.40.  AST 26, ALT 10, alk phos 89, total bilirubin 1.3, albumin  4.0.  Chest x-ray showed vascular congestion.  EKG showed atrial fibrillation and paced rhythm.  proBNP was 4561.  Troponin 94.  The patient was given 80 mg of furosemide  IV.  He was admitted for further evaluation and treatment of his decompensated heart failure.  Cardiology was consulted to assist with management.   Review of Systems: As mentioned in the history of present illness. All other systems  reviewed and are negative. Past Medical History:  Diagnosis Date   Allergy to ACE inhibitors    Angioedema many years ago; patient has tolerated Losartan  (ARB) in the past - it was held during admisson for GI bleed and worsening renal function >> resume Losartan  25 mg QD 06/2015   Arthritis    Atrial fibrillation (HCC)    PAF, CHADs2Vasc = 5   Carotid artery disease    60-79% LICA   CHF (congestive heart failure) (HCC)    CKD (chronic kidney disease)    Coronary artery disease    Diabetes mellitus    GERD (gastroesophageal reflux disease)    Hyperlipidemia    Hypertension    ICD (implantable cardiac defibrillator) in place 03/28/2012   Biotronik, Dr. Waddell 03/28/12   Ischemic cardiomyopathy    S/P CABG x 3; EF 25%   Mitral regurgitation    S/P mitral valve repair 2013   Myocardial infarction [facility])    Renal insufficiency    Past Surgical History:  Procedure Laterality Date   BIV UPGRADE N/A 10/12/2020   Procedure: BIV ICD UPGRADE;  Surgeon: Waddell Danelle ORN, MD;  Location: [facility] INVASIVE CV LAB;  Service: Cardiovascular;  Laterality: N/A;   CARDIOVERSION N/A 04/01/2024   Procedure: CARDIOVERSION;  Surgeon: Lonni Slain, MD;  Location: [facility] INVASIVE CV LAB;  Service: Cardiovascular;  Laterality: N/A;   CHOLECYSTECTOMY N/A 09/20/2017   Procedure: LAPAROSCOPIC SUBTOTAL CHOLECYSTECTOMY;  Surgeon: Rubin Calamity, MD;  Location: [facility] OR;  Service: General;  Laterality: N/A;   COLONOSCOPY N/A 04/24/2014   RMR Pancolonic diverticulosis   CORONARY ARTERY BYPASS GRAFT  10/17/2011   Procedure: CORONARY ARTERY BYPASS GRAFTING (CABG);  Surgeon: Sudie VEAR Laine, MD;  Location: [facility] OR;  Service: Open Heart Surgery;  Laterality: N/A;   ERCP N/A 09/29/2017   Procedure: ENDOSCOPIC RETROGRADE CHOLANGIOPANCREATOGRAPHY (ERCP);  Surgeon: Rollin Dover, MD;  Location: [facility] ENDOSCOPY;  Service: Endoscopy;  Laterality: N/A;   ESOPHAGOGASTRODUODENOSCOPY N/A 04/24/2014   RMR Subtle nodularity the gastric mucosa  of uncertain significance-status post biopsy. Hiatal hernia. chronic inflammation, no H.pylori   ESOPHAGOGASTRODUODENOSCOPY N/A 06/24/2016   Dr. Shaaron: normal esophagus, small hiatal hernia, normal duodenum   ICD  03/28/2012   IMPLANTABLE CARDIOVERTER DEFIBRILLATOR IMPLANT N/A 03/28/2012   Procedure: IMPLANTABLE CARDIOVERTER DEFIBRILLATOR IMPLANT;  Surgeon: Danelle LELON Birmingham, MD;  Location: [facility] CATH LAB;  Service: Cardiovascular;  Laterality: N/A;   KNEE ARTHROSCOPY  ~ 2008   right   LEFT AND RIGHT HEART CATHETERIZATION WITH CORONARY ANGIOGRAM N/A 10/12/2011   Procedure: LEFT AND RIGHT HEART CATHETERIZATION WITH CORONARY ANGIOGRAM;  Surgeon: Deatrice DELENA Cage, MD;  Location: [facility] CATH LAB;  Service: Cardiovascular;  Laterality: N/A;   MITRAL VALVE REPAIR  10/17/2011   Procedure: MITRAL VALVE REPAIR (MVR);  Surgeon: Sudie VEAR Laine, MD;  Location: [facility] OR;  Service: Open Heart Surgery;  Laterality: N/A;   NASAL SINUS SURGERY  1990's   right   Social History:  reports that he has never smoked. He has never used smokeless tobacco. He reports that he does not drink alcohol and does not use drugs.  Allergies  Allergen Reactions   Ace Inhibitors Swelling and Other (See Comments)    Kidney issues; angioedema per PMH in chart   Penicillins Palpitations    Family History  Problem Relation Age of Onset   Heart disease Mother    Diabetes Father    Cardiomyopathy Father    Colon cancer Neg Hx     Prior to Admission medications   Medication Sig Start Date End Date Taking? Authorizing Provider  acetaminophen  (TYLENOL ) 650 MG CR tablet Take 1,300 mg by mouth every 8 (eight) hours as needed for pain.    [provider]  apixaban  (ELIQUIS ) 2.5 MG TABS tablet Take 1 tablet (2.5 mg total) by mouth 2 (two) times daily. 05/09/24   Landy Barnie RAMAN, NP  carvedilol  (COREG ) 12.5 MG tablet Take 12.5 mg by mouth 2 (two) times daily with a meal.    [provider]  dapagliflozin  propanediol (FARXIGA )  10 MG TABS tablet Take 1 tablet (10 mg total) by mouth daily. 05/23/24   Birmingham Danelle LELON, MD  gabapentin  (NEURONTIN ) 100 MG capsule Take 3 capsules (300 mg total) by mouth at bedtime. 05/09/24   Landy Barnie RAMAN, NP  guaiFENesin  (ROBITUSSIN) 100 MG/5ML liquid Take 5 mLs by mouth every 4 (four) hours as needed for cough or to loosen phlegm. 04/25/24   Maree, Pratik D, DO  HUMALOG  KWIKPEN 100 UNIT/ML KwikPen Inject 4-10 Units into the skin 2 (two) times daily. 4 units lunch, 10 units dinner 05/09/24   Landy Barnie RAMAN, NP  insulin  glargine, 1 Unit Dial , (TOUJEO  SOLOSTAR) 300 UNIT/ML Solostar Pen Inject 26 Units into the skin daily. 05/09/24   Landy Barnie RAMAN, NP  melatonin 5 MG TABS Take 5 mg by mouth at bedtime.    [provider]  omeprazole  (PRILOSEC) 40 MG capsule Take 1 capsule (40 mg total) by mouth daily before breakfast. 05/09/24   Landy Barnie RAMAN, NP  rosuvastatin  (CRESTOR ) 5 MG tablet Take 1 tablet (5 mg total) by mouth daily.  05/09/24   Landy Barnie RAMAN, NP  torsemide  (DEMADEX ) 20 MG tablet Take 40 mg by mouth daily.    [provider]    Physical Exam: Vitals:   07/16/24 1012 07/16/24 1013  BP: 121/70   Pulse: 84   Resp: 19   Temp: 98.1 F (36.7 C)   SpO2: 92%   Weight:  111.1 kg  Height:  6' 4 (1.93 m)   GENERAL:  A&O x 3, NAD, well developed, cooperative, follows commands HEENT: [place]/AT, No thrush, No icterus, No oral ulcers Neck:  No neck mass, No meningismus, soft, supple CV: RRR, no S3, no S4, no rub, no JVD Lungs:  bibasilar crakles Abd: soft/NT +BS, nondistended Ext: 3 + LE edema, no lymphangitis, no cyanosis, no rashes Neuro:  CN II-XII intact, strength 4/5 in RUE, RLE, strength 4/5 LUE, LLE; sensation intact bilateral; no dysmetria; babinski equivocal  Data Reviewed: Data reviewed above in history  Assessment and Plan: Acute on chronic HFrEF - 03/30/2024 echo EF 20-25%, global HK, mild decreased RVF, s/p MV annuloplasty - start IV furosemide  80 IV  bid - Cardiology consult appreciated - Daily weights-- - Accurate I's and O's-- - 04/01/2024 discharge weight 234 lbs - Admission weight 254 lbs   Persistent atrial fibrillation - Status post DCCV 04/01/2024 - Continue carvedilol >>holding due to soft BPs now - Continue apixaban  - Previously on amiodarone , but discontinued secondary to dizziness and generalized weakness - remains rate controlled   chronic CKD stage IV - Baseline creatinine 2.2-2.5 - monitor BMP with diuresis - Secondary to cardiorenal syndrome   Acute respiratory failure with hypoxia -he is now on 2L - due to pulmonary edema -wean to RA    Coronary artery disease /elevated troponin - Status post CABG -Secondary to demand ischemia - No angina - continue crestor  - Currently on apixaban    Diabetes mellitus type 2, uncontrolled with hyperglycemia - 03/29/2024 hemoglobin A1c 7.7 - Continue NovoLog  sliding scale - Continue Semglee  reduced dose - He is on Toujeo  26 units daily at home   Hypokalemia - Repleted - Check magnesium    Mixed hyperlipidemia - continue crestor    Sinus node dysfunction s/p ICD 2013 with upgrade of Biotronik BiV ICD 09/2020 - Device interrogation 02/2024: AP/VP rhythm with no treated arrhythmias - Continue to follow with EP   Goals of Care -DNR/DNI confirmed with patient    Advance Care Planning: DNR/DNI  Consults: cardiology  Family Communication: nephew 11/18  Severity of Illness: The appropriate patient status for this patient is INPATIENT. Inpatient status is judged to be reasonable and necessary in order to provide the required intensity of service to ensure the patient's safety. The patient's presenting symptoms, physical exam findings, and initial radiographic and laboratory data in the context of their chronic comorbidities is felt to place them at high risk for further clinical deterioration. Furthermore, it is not anticipated that the patient will be medically stable for  discharge from the hospital within 2 midnights of admission.   * I certify that at the point of admission it is my clinical judgment that the patient will require inpatient hospital care spanning beyond 2 midnights from the point of admission due to high intensity of service, high risk for further deterioration and high frequency of surveillance required.*  Author: Alm Schneider, MD 07/16/2024 1:24 PM  For on call review [email or web address].

## 2024-07-16 NOTE — Progress Notes (Signed)
 Cardiology Office Note  Date: 07/16/2024   ID: Wayne Gordon, DOB 10-26-1933, MRN [id number]  PCP:  Renato Dorothey HERO, NP  Cardiologist:  Diannah SHAUNNA Maywood, MD Electrophysiologist:  None   History of Present Illness: Wayne Gordon is a 89 y.o. male  Here for follow-up visit of ADHF.  Accompanied by nephew.  Nephew helps with transportation.  Gained 18 pounds in the last 3 to 4 weeks.  Reported having SOB with exertional activities for the last 2 to 3 weeks.  He has bilateral leg swelling. He is not active at home.  Lives with his wife and son.  No angina.  No dizziness.  I increased the torsemide  dose from 20 mg to 40 mg once daily in the last 1 week but this did not help with his symptoms or weight.  I reviewed his weight log since the beginning of November 2025.   Past Medical History:  Diagnosis Date   Allergy to ACE inhibitors    Angioedema many years ago; patient has tolerated Losartan  (ARB) in the past - it was held during admisson for GI bleed and worsening renal function >> resume Losartan  25 mg QD 06/2015   Arthritis    Atrial fibrillation (HCC)    PAF, CHADs2Vasc = 5   Carotid artery disease    60-79% LICA   CHF (congestive heart failure) (HCC)    CKD (chronic kidney disease)    Coronary artery disease    Diabetes mellitus    GERD (gastroesophageal reflux disease)    Hyperlipidemia    Hypertension    ICD (implantable cardiac defibrillator) in place 03/28/2012   Biotronik, Dr. Waddell 03/28/12   Ischemic cardiomyopathy    S/P CABG x 3; EF 25%   Mitral regurgitation    S/P mitral valve repair 2013   Myocardial infarction [name]    Renal insufficiency     Past Surgical History:  Procedure Laterality Date   BIV UPGRADE N/A 10/12/2020   Procedure: BIV ICD UPGRADE;  Surgeon: Waddell Danelle ORN, MD;  Location: [facility] INVASIVE CV LAB;  Service: Cardiovascular;  Laterality: N/A;   CARDIOVERSION N/A 04/01/2024   Procedure: CARDIOVERSION;  Surgeon: Lonni Slain, MD;   Location: [facility] INVASIVE CV LAB;  Service: Cardiovascular;  Laterality: N/A;   CHOLECYSTECTOMY N/A 09/20/2017   Procedure: LAPAROSCOPIC SUBTOTAL CHOLECYSTECTOMY;  Surgeon: Rubin Calamity, MD;  Location: [facility] OR;  Service: General;  Laterality: N/A;   COLONOSCOPY N/A 04/24/2014   RMR Pancolonic diverticulosis   CORONARY ARTERY BYPASS GRAFT  10/17/2011   Procedure: CORONARY ARTERY BYPASS GRAFTING (CABG);  Surgeon: Sudie VEAR Laine, MD;  Location: [facility] OR;  Service: Open Heart Surgery;  Laterality: N/A;   ERCP N/A 09/29/2017   Procedure: ENDOSCOPIC RETROGRADE CHOLANGIOPANCREATOGRAPHY (ERCP);  Surgeon: Rollin Dover, MD;  Location: [facility] ENDOSCOPY;  Service: Endoscopy;  Laterality: N/A;   ESOPHAGOGASTRODUODENOSCOPY N/A 04/24/2014   RMR Subtle nodularity the gastric mucosa of uncertain significance-status post biopsy. Hiatal hernia. chronic inflammation, no H.pylori   ESOPHAGOGASTRODUODENOSCOPY N/A 06/24/2016   Dr. Shaaron: normal esophagus, small hiatal hernia, normal duodenum   ICD  03/28/2012   IMPLANTABLE CARDIOVERTER DEFIBRILLATOR IMPLANT N/A 03/28/2012   Procedure: IMPLANTABLE CARDIOVERTER DEFIBRILLATOR IMPLANT;  Surgeon: Danelle ORN Waddell, MD;  Location: [facility] CATH LAB;  Service: Cardiovascular;  Laterality: N/A;   KNEE ARTHROSCOPY  ~ 2008   right   LEFT AND RIGHT HEART CATHETERIZATION WITH CORONARY ANGIOGRAM N/A 10/12/2011   Procedure: LEFT AND RIGHT HEART CATHETERIZATION WITH CORONARY ANGIOGRAM;  Surgeon: Deatrice LABOR  Darron, MD;  Location: [facility] CATH LAB;  Service: Cardiovascular;  Laterality: N/A;   MITRAL VALVE REPAIR  10/17/2011   Procedure: MITRAL VALVE REPAIR (MVR);  Surgeon: Sudie VEAR Laine, MD;  Location: [facility] OR;  Service: Open Heart Surgery;  Laterality: N/A;   NASAL SINUS SURGERY  1990's   right    Current Outpatient Medications  Medication Sig Dispense Refill   acetaminophen  (TYLENOL ) 650 MG CR tablet Take 1,300 mg by mouth every 8 (eight) hours as needed for pain.     apixaban  (ELIQUIS ) 2.5 MG TABS  tablet Take 1 tablet (2.5 mg total) by mouth 2 (two) times daily. 60 tablet 0   carvedilol  (COREG ) 12.5 MG tablet Take 12.5 mg by mouth 2 (two) times daily with a meal.     dapagliflozin  propanediol (FARXIGA ) 10 MG TABS tablet Take 1 tablet (10 mg total) by mouth daily. 90 tablet 2   gabapentin  (NEURONTIN ) 100 MG capsule Take 3 capsules (300 mg total) by mouth at bedtime. 90 capsule 0   guaiFENesin  (ROBITUSSIN) 100 MG/5ML liquid Take 5 mLs by mouth every 4 (four) hours as needed for cough or to loosen phlegm. 120 mL 0   HUMALOG  KWIKPEN 100 UNIT/ML KwikPen Inject 4-10 Units into the skin 2 (two) times daily. 4 units lunch, 10 units dinner 15 mL 0   insulin  glargine, 1 Unit Dial , (TOUJEO  SOLOSTAR) 300 UNIT/ML Solostar Pen Inject 26 Units into the skin daily. 4.5 mL 0   melatonin 5 MG TABS Take 5 mg by mouth at bedtime.     omeprazole  (PRILOSEC) 40 MG capsule Take 1 capsule (40 mg total) by mouth daily before breakfast. 30 capsule 0   rosuvastatin  (CRESTOR ) 5 MG tablet Take 1 tablet (5 mg total) by mouth daily. 30 tablet 0   torsemide  (DEMADEX ) 20 MG tablet Take 40 mg by mouth daily.     No current facility-administered medications for this visit.   Allergies:  Ace inhibitors and Penicillins   Social History: The patient  reports that he has never smoked. He has never used smokeless tobacco. He reports that he does not drink alcohol and does not use drugs.   Family History: The patient's family history includes Cardiomyopathy in his father; Diabetes in his father; Heart disease in his mother.   ROS:  Please see the history of present illness. Otherwise, complete review of systems is positive for none.  All other systems are reviewed and negative.   Physical Exam: VS:  BP 118/70   Pulse 69   Ht 6' 4 (1.93 m)   Wt 254 lb (115.2 kg)   SpO2 97%   BMI 30.92 kg/m , BMI Body mass index is 30.92 kg/m.  Wt Readings from Last 3 Encounters:  07/16/24 254 lb (115.2 kg)  05/30/24 236 lb 11.2 oz  (107.4 kg)  05/29/24 236 lb 12.8 oz (107.4 kg)    General: Patient appears comfortable at rest. HEENT: Conjunctiva and lids normal, oropharynx clear with moist mucosa. Neck: Supple, no elevated JVP or carotid bruits, no thyromegaly. Lungs: Clear to auscultation, nonlabored breathing at rest. Cardiac: Regular rate and rhythm, no S3 or significant systolic murmur, no pericardial rub. Abdomen: Soft, nontender, no hepatomegaly, bowel sounds present, no guarding or rebound. Extremities: No pitting edema, distal pulses 2+. Skin: Warm and dry. Musculoskeletal: No kyphosis. Neuropsychiatric: Alert and oriented x3, affect grossly appropriate.  Recent Labwork: 04/18/2024: TSH 4.368 04/22/2024: ALT 44; AST 55 04/25/2024: Hemoglobin 12.3; Magnesium  2.5; Platelets 234 07/02/2024: BNP 663.4 07/11/2024:  BUN 25; Creatinine, Ser 2.34; Potassium 4.0; Sodium 140     Component Value Date/Time   CHOL 120 10/17/2021 0431   TRIG 69 10/17/2021 0431   HDL 28 (L) 10/17/2021 0431   CHOLHDL 4.3 10/17/2021 0431   VLDL 14 10/17/2021 0431   LDLCALC 78 10/17/2021 0431     Assessment and Plan:  Acute on chronic systolic and diastolic heart failure - Reported DOE and bilateral leg swelling x 2 to 3 weeks.  18 pound weight gain x 3 to 4 weeks.  Compliant with diet/fluid restrictions.  Increased p.o. torsemide  from 20 mg to 40 mg once daily 1 week ago that did not improve his symptoms or weight. RLE>LLE swelling.  Needs ER visit, get admitted for IV diuresis for ADHF. On torsemide  40 mg once daily at home.  Recommend starting with IV Lasix  80 mg twice daily and assess response. If required, may need to uptitrate.  Needs RLE ultrasound venous Doppler to rule out DVT. I spoke with EDP at [facility], Dr Melvenia who is aware of the plan.  Inpatient cardiology team will see the patient tomorrow morning. - Repeat echocardiogram. - Hold p.o. torsemide .  IV Lasix  as above.  Need to mail [place] Furoscix  upon discharge. - Continue carvedilol   12.5 mg twice daily. - Not a candidate for ACE/ARB/ARNI/MRA due to CKD stage IIIb-IV - Not a candidate for advanced heart failure therapies due to advanced age. - I spoke with the patient's nephew who is willing to drive him around, [place] or [place] for advanced heart failure evaluation due to recurrent ADHF hospitalizations.  Prior hospitalization for ADHF was in August 2025.  He is compliant with diet/fluid restrictions.  Persistent A-fib s/p DCCV in August 2025 - EKG pending. - Continue carvedilol  12.5 mg twice daily. - Continue Eliquis  2.5 mg twice daily.  CAD s/p CABG - Not on aspirin  due to Eliquis  use. - Continue rosuvastatin  5 mg nightly.  S/p MVR - Prior echo from August 2025 showed LVEF 20 to 25%, RV mildly reduced, moderate enlargement of RV, normal mitral annuloplasty ring with mild MR, CVP 8 mmHg.  Repeat echocardiogram due to new change in the clinical status.  S/p CRT-D - Follows with EP outpatient.  20 minutes spent with the patient and nephew.  10 minutes spent in reviewing prior medical records, specialist notes, more than 3 labs.  5-minute spent in relaying the recommendations to the EDP and inpatient cardiology team.   Medication Adjustments/Labs and Tests Ordered: Current medicines are reviewed at length with the patient today.  Concerns regarding medicines are outlined above.    Disposition:  Follow up 1 month (can be scheduled by cardiology team when he is closer to discharge) with APP/MD  Signed, Antwaine Boomhower Priya Toyia Jelinek, MD, 07/16/2024 9:20 AM    [facility] at [facility] [address], [place], [place] [address]

## 2024-07-16 NOTE — ED Triage Notes (Signed)
 Pt arrived via POV c/o worsening bilateral lower extremity swelling. Pt denies CP, but does endorse weakness and SOB with exertion.

## 2024-07-16 NOTE — Plan of Care (Signed)

## 2024-07-16 NOTE — ED Provider Notes (Signed)
 [place] [facility] AT [facility] Provider Note   CSN: [id number] Arrival date & time: 07/16/24  0945     Patient presents with: fluid retention   Wayne Gordon is a 89 y.o. male.   HPI Patient presents from outpatient cardiology office visit for hypervolemia.  Medical history includes CHF, CAD, HTN, atrial flutter, DM, CKD, GERD, HLD, anemia.  He is on Eliquis .  He has had worsening swelling in his legs despite increased home torsemide .  He has also had generalized weakness and exertional shortness of breath.    Prior to Admission medications   Medication Sig Start Date End Date Taking? Authorizing Provider  acetaminophen  (TYLENOL ) 650 MG CR tablet Take 1,300 mg by mouth every 8 (eight) hours as needed for pain.    [provider]  apixaban  (ELIQUIS ) 2.5 MG TABS tablet Take 1 tablet (2.5 mg total) by mouth 2 (two) times daily. 05/09/24   Landy Barnie RAMAN, NP  carvedilol  (COREG ) 12.5 MG tablet Take 12.5 mg by mouth 2 (two) times daily with a meal.    [provider]  dapagliflozin  propanediol (FARXIGA ) 10 MG TABS tablet Take 1 tablet (10 mg total) by mouth daily. 05/23/24   Waddell Danelle ORN, MD  gabapentin  (NEURONTIN ) 100 MG capsule Take 3 capsules (300 mg total) by mouth at bedtime. 05/09/24   Landy Barnie RAMAN, NP  guaiFENesin  (ROBITUSSIN) 100 MG/5ML liquid Take 5 mLs by mouth every 4 (four) hours as needed for cough or to loosen phlegm. 04/25/24   Maree, Pratik D, DO  HUMALOG  KWIKPEN 100 UNIT/ML KwikPen Inject 4-10 Units into the skin 2 (two) times daily. 4 units lunch, 10 units dinner 05/09/24   Landy Barnie RAMAN, NP  insulin  glargine, 1 Unit Dial , (TOUJEO  SOLOSTAR) 300 UNIT/ML Solostar Pen Inject 26 Units into the skin daily. 05/09/24   Landy Barnie RAMAN, NP  melatonin 5 MG TABS Take 5 mg by mouth at bedtime.    [provider]  omeprazole  (PRILOSEC) 40 MG capsule Take 1 capsule (40 mg total) by mouth daily before breakfast. 05/09/24   Landy Barnie RAMAN, NP  rosuvastatin  (CRESTOR ) 5 MG tablet Take 1 tablet (5 mg total) by mouth daily. 05/09/24   Landy Barnie RAMAN, NP  torsemide  (DEMADEX ) 20 MG tablet Take 40 mg by mouth daily.    [provider]    Allergies: Ace inhibitors and Penicillins    Review of Systems  Constitutional:  Positive for fatigue.  Respiratory:  Positive for shortness of breath.   Cardiovascular:  Positive for leg swelling.  Neurological:  Positive for weakness.  All other systems reviewed and are negative.   Updated Vital Signs BP 121/70 (BP Location: Left Arm)   Pulse 84   Temp 98.1 F (36.7 C)   Resp 19   Ht 6' 4 (1.93 m)   Wt 111.1 kg   SpO2 92%   BMI 29.82 kg/m   Physical Exam Vitals and nursing note reviewed.  Constitutional:      General: He is not in acute distress.    Appearance: Normal appearance. He is well-developed. He is not ill-appearing, toxic-appearing or diaphoretic.  HENT:     Head: Normocephalic and atraumatic.     Right Ear: External ear normal.     Left Ear: External ear normal.     Nose: Nose normal.     Mouth/Throat:     Mouth: Mucous membranes are moist.  Eyes:     Extraocular Movements: Extraocular movements  intact.     Conjunctiva/sclera: Conjunctivae normal.  Cardiovascular:     Rate and Rhythm: Normal rate and regular rhythm.     Heart sounds: No murmur heard.    No gallop.  Pulmonary:     Effort: Pulmonary effort is normal. No respiratory distress.  Abdominal:     General: There is no distension.     Palpations: Abdomen is soft.  Musculoskeletal:        General: No swelling. Normal range of motion.     Cervical back: Normal range of motion and neck supple.     Right lower leg: Edema present.     Left lower leg: Edema present.  Skin:    General: Skin is warm and dry.     Coloration: Skin is pale. Skin is not jaundiced.  Neurological:     General: No focal deficit present.     Mental Status: He is alert and oriented to person, place, and  time.  Psychiatric:        Mood and Affect: Mood normal.        Behavior: Behavior normal.     (all labs ordered are listed, but only abnormal results are displayed) Labs Reviewed  CBC WITH DIFFERENTIAL/PLATELET - Abnormal; Notable for the following components:      Result Value   RDW 16.3 (*)    All other components within normal limits  COMPREHENSIVE METABOLIC PANEL WITH GFR - Abnormal; Notable for the following components:   Potassium 3.1 (*)    Chloride 96 (*)    Glucose, Bld 128 (*)    BUN 31 (*)    Creatinine, Ser 2.40 (*)    Total Bilirubin 1.3 (*)    GFR, Estimated 25 (*)    All other components within normal limits  PRO BRAIN NATRIURETIC PEPTIDE - Abnormal; Notable for the following components:   Pro Brain Natriuretic Peptide 4,561.0 (*)    All other components within normal limits  MAGNESIUM  - Abnormal; Notable for the following components:   Magnesium  2.5 (*)    All other components within normal limits  TROPONIN T, HIGH SENSITIVITY - Abnormal; Notable for the following components:   Troponin T High Sensitivity 94 (*)    All other components within normal limits  TROPONIN T, HIGH SENSITIVITY    EKG: EKG Interpretation Date/Time:  Tuesday July 16 2024 10:16:31 EST Ventricular Rate:  85 PR Interval:    QRS Duration:  172 QT Interval:  428 QTC Calculation: 509 R Axis:   246  Text Interpretation: AV dual-paced rhythm Confirmed by Melvenia Motto [phone]) on 07/16/2024 11:28:37 AM  Radiology: US  Venous Img Lower Unilateral Right Result Date: 07/16/2024 CLINICAL DATA:  Right lower extremity edema. EXAM: RIGHT LOWER EXTREMITY VENOUS DOPPLER ULTRASOUND TECHNIQUE: Gray-scale sonography with graded compression, as well as color Doppler and duplex ultrasound were performed to evaluate the lower extremity deep venous systems from the level of the common femoral vein and including the common femoral, femoral, profunda femoral, popliteal and calf veins including the  posterior tibial, peroneal and gastrocnemius veins when visible. The superficial great saphenous vein was also interrogated. Spectral Doppler was utilized to evaluate flow at rest and with distal augmentation maneuvers in the common femoral, femoral and popliteal veins. COMPARISON:  None Available. FINDINGS: Contralateral Common Femoral Vein: Respiratory phasicity is normal and symmetric with the symptomatic side. No evidence of thrombus. Normal compressibility. Common Femoral Vein: No evidence of thrombus. Normal compressibility, respiratory phasicity and response to augmentation. Saphenofemoral Junction: No  evidence of thrombus. Normal compressibility and flow on color Doppler imaging. Profunda Femoral Vein: No evidence of thrombus. Normal compressibility and flow on color Doppler imaging. Femoral Vein: No evidence of thrombus. Normal compressibility, respiratory phasicity and response to augmentation. Popliteal Vein: No evidence of thrombus. Normal compressibility, respiratory phasicity and response to augmentation. Calf Veins: No evidence of thrombus. Normal compressibility and flow on color Doppler imaging. Superficial Great Saphenous Vein: No evidence of thrombus. Normal compressibility. Venous Reflux:  None. Other Findings: No evidence of superficial thrombophlebitis or abnormal fluid collection. IMPRESSION: No evidence of right lower extremity deep venous thrombosis. Electronically Signed   By: Marcey Moan M.D.   On: 07/16/2024 11:46   DG Chest 2 View Result Date: 07/16/2024 EXAM: 2 VIEW(S) XRAY OF THE CHEST 07/16/2024 10:36:00 AM COMPARISON: 04/19/2024 CLINICAL HISTORY: SOB, CP FINDINGS: LINES, TUBES AND DEVICES: Left-sided defibrillator is unchanged. LUNGS AND PLEURA: Central pulmonary vascular congestion. Elevated right hemidiaphragm. Minimal bibasilar subsegmental atelectasis. No pleural effusion. No pneumothorax. HEART AND MEDIASTINUM: Stable cardiomegaly. Left-sided defibrillator is unchanged.  Status post coronary artery bypass graft and cardiac valve repair. BONES AND SOFT TISSUES: No acute osseous abnormality. IMPRESSION: 1. Stable cardiomegaly and central pulmonary vascular congestion. 2. Elevated right hemidiaphragm. 3. Minimal bibasilar subsegmental atelectasis. Electronically signed by: Lynwood Seip MD 07/16/2024 10:55 AM EST RP Workstation: [id number]     Procedures   Medications Ordered in the ED  potassium chloride  SA (KLOR-CON  M) CR tablet 40 mEq (has no administration in time range)  furosemide  (LASIX ) injection 80 mg (has no administration in time range)                                    Medical Decision Making Amount and/or Complexity of Data Reviewed Labs: ordered. Radiology: ordered.  Risk Prescription drug management. Decision regarding hospitalization.   This patient presents to the ED for concern of leg swelling, this involves an extensive number of treatment options, and is a complaint that carries with it a high risk of complications and morbidity.  The differential diagnosis includes CHF exacerbation, venous insufficiency, DVT, poor oncotic pressure   Co morbidities / Chronic conditions that complicate the patient evaluation  CHF, CAD, HTN, atrial flutter, DM, CKD, GERD, HLD, anemia   Additional history obtained:  Additional history obtained from EMR External records from outside source obtained and reviewed including patient's cardiologist and nephew   Lab Tests:  I Ordered, and personally interpreted labs.  The pertinent results include: Hemoglobin, no leukocytosis, baseline CKD, hypokalemia and hypermagnesemia with otherwise normal electrolytes, elevated troponin and BNP consistent with CHF exacerbation.   Imaging Studies ordered:  I ordered imaging studies including chest x-ray, RLE DVT study I independently visualized and interpreted imaging which showed no evidence of DVT.  Cardiomegaly with central vascular congestion on chest x-ray  present. I agree with the radiologist interpretation   Cardiac Monitoring: / EKG:  The patient was maintained on a cardiac monitor.  I personally viewed and interpreted the cardiac monitored which showed an underlying rhythm of: Paced rhythm  Problem List / ED Course / Critical interventions / Medication management  Patient presenting from cardiology office for failure of outpatient diuretic.  His leg swelling has been increasing over the past 2 to 3 weeks.  This is despite increased dose of torsemide  at home.  He has had a 18 pound weight gain.  I spoke with his cardiologist prior to arrival.  She recommends initiating IV Lasix , right leg DVT study, and admission to the hospital.  On exam, patient overall well-appearing.  Current breathing is unlabored.  Lab work shows elevations in BNP and troponin consistent with CHF exacerbation.  Hypokalemia is present and potassium supplement was ordered.  IV Lasix  was initiated.  Patient was admitted for further management. I ordered medication including potassium chloride  for hypokalemia, Lasix  for diuresis Reevaluation of the patient after these medicines showed that the patient stayed the same I have reviewed the patients home medicines and have made adjustments as needed  Social Determinants of Health:  Has access to outpatient care     Final diagnoses:  Acute on chronic congestive heart failure, unspecified heart failure type [facility])  Failure of outpatient treatment    ED Discharge Orders     None          Melvenia Motto, MD 07/16/24 1237

## 2024-07-16 NOTE — Patient Instructions (Signed)
 Medication Instructions:  Your physician recommends that you continue on your current medications as directed. Please refer to the Current Medication list given to you today.  *If you need a refill on your cardiac medications before your next appointment, please call your pharmacy*  Lab Work: None If you have labs (blood work) drawn today and your tests are completely normal, you will receive your results only by: MyChart Message (if you have MyChart) OR A paper copy in the mail If you have any lab test that is abnormal or we need to change your treatment, we will call you to review the results.  Testing/Procedures: None  Follow-Up: At [facility], you and your health needs are our priority.  As part of our continuing mission to provide you with exceptional heart care, our providers are all part of one team.  This team includes your primary Cardiologist (physician) and Advanced Practice Providers or APPs (Physician Assistants and Nurse Practitioners) who all work together to provide you with the care you need, when you need it.  Your next appointment:    After discharge  Provider:   You may see Vishnu P Mallipeddi, MD or one of the following Advanced Practice Providers on your designated Care Team:   Brittany Strader, PA-C  Scotesia Dunlap, [place] Olivia Pavy, [place]     We recommend signing up for the patient portal called MyChart.  Sign up information is provided on this After Visit Summary.  MyChart is used to connect with patients for Virtual Visits (Telemedicine).  Patients are able to view lab/test results, encounter notes, upcoming appointments, etc.  Non-urgent messages can be sent to your provider as well.   To learn more about what you can do with MyChart, go to [email or web address].   Other Instructions

## 2024-07-16 NOTE — Hospital Course (Signed)
 89 year old male with HFrEF, CAD status post CABG and mitral valve repair 2013, LVEF 20-25%, status post ICD, pacer placement, stage IV CKD, persistent atrial fibrillation/atrial flutter on chronic anticoagulation with apixaban  status post DCCV 04/01/24, ype 2 diabetes mellitus on insulin , CKD 4 presenting with dyspnea on exertion for about a week.  The patient was at his cardiology office visit today.  He was noted to be fluid overloaded and has gained 18 pounds over the past month.  His office weight was 254 pounds. Patient endorses compliance with his medications.  Notably, the patient had recently had his torsemide  increased from 20 mg daily to 40 mg daily for about a week.  He has continued to have dyspnea on exertion and worsening lower extremity edema and increasing abdominal girth. Patient denies fevers, chills, headache, chest pain,  nausea, vomiting, diarrhea, abdominal pain, dysuria, hematuria, hematochezia, and melena. In the ED, the patient was afebrile and hemodynamically stable with oxygen  saturation 92% on room air.  WBC 7.4, hemoglobin 13.6, platelets 204.  Sodium 139, potassium 3.1, bicarbonate 30, serum creatinine 2.40.  AST 26, ALT 10, alk phos 89, total bilirubin 1.3, albumin  4.0.  Chest x-ray showed vascular congestion.  EKG showed atrial fibrillation and paced rhythm.  proBNP was 4561.  Troponin 94.  The patient was given 80 mg of furosemide  IV.  He was admitted for further evaluation and treatment of his decompensated heart failure.  Cardiology was consulted to assist with management.

## 2024-07-17 ENCOUNTER — Encounter (HOSPITAL_COMMUNITY): Payer: Self-pay | Admitting: Internal Medicine

## 2024-07-17 ENCOUNTER — Inpatient Hospital Stay (HOSPITAL_COMMUNITY)

## 2024-07-17 DIAGNOSIS — I5021 Acute systolic (congestive) heart failure: Secondary | ICD-10-CM

## 2024-07-17 DIAGNOSIS — I493 Ventricular premature depolarization: Secondary | ICD-10-CM

## 2024-07-17 DIAGNOSIS — I5023 Acute on chronic systolic (congestive) heart failure: Secondary | ICD-10-CM | POA: Diagnosis not present

## 2024-07-17 DIAGNOSIS — E876 Hypokalemia: Secondary | ICD-10-CM | POA: Diagnosis not present

## 2024-07-17 DIAGNOSIS — E785 Hyperlipidemia, unspecified: Secondary | ICD-10-CM

## 2024-07-17 DIAGNOSIS — I10 Essential (primary) hypertension: Secondary | ICD-10-CM | POA: Diagnosis not present

## 2024-07-17 LAB — ECHOCARDIOGRAM COMPLETE
AR max vel: 1.9 cm2
AV Area VTI: 2.03 cm2
AV Area mean vel: 1.81 cm2
AV Mean grad: 2 mmHg
AV Peak grad: 3 mmHg
Ao pk vel: 0.87 m/s
Area-P 1/2: 2.6 cm2
Height: 76 in
MV VTI: 1.09 cm2
S' Lateral: 4.8 cm
Weight: 3943.59 [oz_av]

## 2024-07-17 LAB — GLUCOSE, CAPILLARY
Glucose-Capillary: 86 mg/dL (ref 70–99)
Glucose-Capillary: 164 mg/dL — ABNORMAL HIGH (ref 70–99)
Glucose-Capillary: 256 mg/dL — ABNORMAL HIGH (ref 70–99)
Glucose-Capillary: 184 mg/dL — ABNORMAL HIGH (ref 70–99)

## 2024-07-17 LAB — BASIC METABOLIC PANEL WITH GFR
Anion gap: 13 (ref 5–15)
BUN: 30 mg/dL — ABNORMAL HIGH (ref 8–23)
CO2: 29 mmol/L (ref 22–32)
Calcium: 8.8 mg/dL — ABNORMAL LOW (ref 8.9–10.3)
Chloride: 99 mmol/L (ref 98–111)
Creatinine, Ser: 2.29 mg/dL — ABNORMAL HIGH (ref 0.61–1.24)
GFR, Estimated: 27 mL/min — ABNORMAL LOW (ref >60)
Glucose, Bld: 93 mg/dL (ref 70–99)
Potassium: 3.4 mmol/L — ABNORMAL LOW (ref 3.5–5.1)
Sodium: 142 mmol/L (ref 135–145)

## 2024-07-17 LAB — MAGNESIUM: Magnesium: 2.4 mg/dL (ref 1.7–2.4)

## 2024-07-17 MED ORDER — PERFLUTREN LIPID MICROSPHERE
1.0000 mL | INTRAVENOUS | Status: AC | PRN
  Administered 2024-07-17: 2 mL via INTRAVENOUS

## 2024-07-17 MED ORDER — POTASSIUM CHLORIDE CRYS ER 20 MEQ PO TBCR
40.0000 meq | EXTENDED_RELEASE_TABLET | Freq: Three times a day (TID) | ORAL | Status: AC
  Administered 2024-07-17 (×3): 40 meq via ORAL
  Filled 2024-07-17 (×3): qty 2

## 2024-07-17 NOTE — Progress Notes (Signed)
 PROGRESS NOTE    Wayne Gordon  FMW:[id number] DOB: [birth year] DOA: 07/16/2024 PCP: Renato Dorothey HERO, NP   Brief Narrative:    Wayne Gordon is a 89 year old male with HFrEF, CAD status post CABG and mitral valve repair 2013, LVEF 20-25%, status post ICD, pacer placement, stage IV CKD, persistent atrial fibrillation/atrial flutter on chronic anticoagulation with apixaban  status post DCCV 04/01/24, type 2 diabetes mellitus on insulin , CKD 4 presenting with dyspnea on exertion for about a week.  The patient was at his cardiology office visit today.  He was noted to be fluid overloaded and has gained 18 pounds over the past month.  His office weight was 254 pounds. Patient endorses compliance with his medications.  Notably, the patient had recently had his torsemide  increased from 20 mg daily to 40 mg daily for about a week.  He was admitted with acute on chronic HFrEF.  Assessment & Plan:   Principal Problem:   Acute on chronic HFrEF (heart failure with reduced ejection fraction) (HCC) Active Problems:   S/P MVR (mitral valve repair)   History of implantable cardioverter-defibrillator (ICD) placement   CKD (chronic kidney disease) stage 4, GFR 15-29 ml/min (HCC)  Assessment and Plan:   Acute on chronic HFrEF - 03/30/2024 echo EF 20-25%, global HK, mild decreased RVF, s/p MV annuloplasty -06/2024 echo with LVEF 30-35% with indeterminate diastolic function and D-shaped septum - Continue IV furosemide  80 IV bid - Cardiology consult appreciated - Daily weights-- - Accurate I's and O's-- - 04/01/2024 discharge weight 234 lbs - Admission weight 254 lbs   Persistent atrial fibrillation - Status post DCCV 04/01/2024 - Continue carvedilol  - Continue apixaban  - Previously on amiodarone , but discontinued secondary to dizziness and generalized weakness - remains rate controlled   chronic CKD stage IV - Baseline creatinine 2.2-2.5 - monitor BMP with diuresis - Secondary to cardiorenal  syndrome   Acute respiratory failure with hypoxia -he is now on 2L - due to pulmonary edema -wean to RA    Coronary artery disease /elevated troponin - Status post CABG -Secondary to demand ischemia - No angina - continue crestor  - Currently on apixaban    Diabetes mellitus type 2, uncontrolled with hyperglycemia - 03/29/2024 hemoglobin A1c 7.7 - Continue NovoLog  sliding scale - Continue Semglee  reduced dose - He is on Toujeo  26 units daily at home   Hypokalemia - Replete -Continue to monitor - Check magnesium    Mixed hyperlipidemia - continue crestor    Sinus node dysfunction s/p ICD 2013 with upgrade of Biotronik BiV ICD 09/2020 - Device interrogation 02/2024: AP/VP rhythm with no treated arrhythmias - Continue to follow with EP  Obesity, class I - BMI 30   DVT prophylaxis: Apixaban  Code Status: DNR Family Communication: None at bedside Disposition Plan:  Status is: Inpatient Remains inpatient appropriate because: Need for IV medications  Consultants:  Cardiology  Procedures:  None  Antimicrobials:  None   Subjective: Patient seen and evaluated today with no new acute complaints or concerns. No acute concerns or events noted overnight.  Objective: Vitals:   07/17/24 0505 07/17/24 0508 07/17/24 0710 07/17/24 0953  BP: 100/66  115/73   Pulse: 86  90   Resp: 18     Temp: (!) 97.5 F (36.4 C)  97.7 F (36.5 C)   TempSrc: Oral  Oral   SpO2: 94%  90%   Weight:  111.8 kg  112 kg  Height:    6' 6 (1.981 m)    Intake/Output Summary (Last 24  hours) at 07/17/2024 1404 Last data filed at 07/17/2024 1322 Gross per 24 hour  Intake 580 ml  Output 2725 ml  Net -2145 ml   Filed Weights   07/16/24 1013 07/17/24 0508 07/17/24 0953  Weight: 111.1 kg 111.8 kg 112 kg    Examination:  General exam: Appears calm and comfortable  Respiratory system: Clear to auscultation. Respiratory effort normal.  2 L nasal cannula Cardiovascular system: S1 & S2 heard,  RRR.  Gastrointestinal system: Abdomen is soft Central nervous system: Alert and awake Extremities: 2+ pitting edema bilateral lower extremities Skin: No significant lesions noted Psychiatry: Flat affect.    Data Reviewed: I have personally reviewed following labs and imaging studies  CBC: Recent Labs  Lab 07/16/24 1053  WBC 7.4  NEUTROABS 4.7  HGB 13.6  HCT 42.7  MCV 88.0  PLT 204   Basic Metabolic Panel: Recent Labs  Lab 07/11/24 1115 07/16/24 1053 07/17/24 0419  NA 140 139 142  K 4.0 3.1* 3.4*  CL 97 96* 99  CO2 29 30 29   GLUCOSE 124* 128* 93  BUN 25 31* 30*  CREATININE 2.34* 2.40* 2.29*  CALCIUM  8.8 8.9 8.8*  MG  --  2.5* 2.4   GFR: Estimated Creatinine Clearance: 31.4 mL/min (A) (by C-G formula based on SCr of 2.29 mg/dL (H)). Liver Function Tests: Recent Labs  Lab 07/16/24 1053  AST 26  ALT 10  ALKPHOS 89  BILITOT 1.3*  PROT 6.7  ALBUMIN  4.0   No results for input(s): LIPASE, AMYLASE in the last 168 hours. No results for input(s): AMMONIA in the last 168 hours. Coagulation Profile: No results for input(s): INR, PROTIME in the last 168 hours. Cardiac Enzymes: No results for input(s): CKTOTAL, CKMB, CKMBINDEX, TROPONINI in the last 168 hours. BNP (last 3 results) Recent Labs    07/16/24 1053  PROBNP 4,561.0*   HbA1C: No results for input(s): HGBA1C in the last 72 hours. CBG: Recent Labs  Lab 07/16/24 1615 07/16/24 2004 07/17/24 0734 07/17/24 1212  GLUCAP 136* 142* 86 164*   Lipid Profile: No results for input(s): CHOL, HDL, LDLCALC, TRIG, CHOLHDL, LDLDIRECT in the last 72 hours. Thyroid Function Tests: No results for input(s): TSH, T4TOTAL, FREET4, T3FREE, THYROIDAB in the last 72 hours. Anemia Panel: No results for input(s): VITAMINB12, FOLATE, FERRITIN, TIBC, IRON, RETICCTPCT in the last 72 hours. Sepsis Labs: No results for input(s): PROCALCITON, LATICACIDVEN in the last  168 hours.  No results found for this or any previous visit (from the past 240 hours).       Radiology Studies: ECHOCARDIOGRAM COMPLETE Result Date: 07/17/2024    ECHOCARDIOGRAM REPORT   Patient Name:   Wayne Gordon Date of Exam: 07/17/2024 Medical Rec #:  [id number]      Height:       76.0 in Accession #:    [id number]     Weight:       246.5 lb Date of Birth:  Jan 24, 1934       BSA:          2.421 m Patient Age:    89 years       BP:           115/73 mmHg Patient Gender: M              HR:           75 bpm. Exam Location:  [facility] Procedure: 2D Echo, Cardiac Doppler, Color Doppler and Intracardiac  Opacification Agent (Both Spectral and Color Flow Doppler were            utilized during procedure). Indications:    CHF-Acute Systolic I50.21  History:        Patient has prior history of Echocardiogram examinations, most                 recent 03/30/2024. CHF and Cardiomyopathy, CAD, Defibrillator,                 Mitral Valve Disease; Risk Factors:Hypertension and Diabetes.  Sonographer:    Wayne Gordon Referring Phys: [phone] DAVID TAT IMPRESSIONS  1. Left ventricular ejection fraction, by estimation, is 30 to 35%. The left ventricle has moderately decreased function. The left ventricle demonstrates global hypokinesis. There is moderate left ventricular hypertrophy. Left ventricular diastolic parameters are indeterminate. There is the interventricular septum is flattened in systole and diastole, consistent with right ventricular pressure and volume overload.  2. RV poorly visualized. Grossly appears enlarged with decreased systolic function. Indeterminant PASP, IVC poorly visualized. . Right ventricular systolic function was not well visualized. The right ventricular size is not well visualized.  3. #30 MEMO 3D RING ANNuLOPLASTY ring is present at the MV anulus. . The mitral valve has been repaired/replaced. No evidence of mitral valve regurgitation. No evidence of mitral stenosis. The mean mitral  valve gradient is 3.0 mmHg.  4. The tricuspid valve is abnormal.  5. The aortic valve has an indeterminant number of cusps. There is mild calcification of the aortic valve. There is mild thickening of the aortic valve. Aortic valve regurgitation is not visualized. No aortic stenosis is present. FINDINGS  Left Ventricle: Left ventricular ejection fraction, by estimation, is 30 to 35%. The left ventricle has moderately decreased function. The left ventricle demonstrates global hypokinesis. The left ventricular internal cavity size was normal in size. There is moderate left ventricular hypertrophy. The interventricular septum is flattened in systole and diastole, consistent with right ventricular pressure and volume overload. Left ventricular diastolic parameters are indeterminate. Right Ventricle: RV poorly visualized. Grossly appears enlarged with decreased systolic function. Indeterminant PASP, IVC poorly visualized. The right ventricular size is not well visualized. Right vetricular wall thickness was not well visualized. Right  ventricular systolic function was not well visualized. Left Atrium: Left atrial size was not well visualized. Right Atrium: Right atrial size was not well visualized. Pericardium: There is no evidence of pericardial effusion. Mitral Valve: #30 MEMO 3D RING ANNuLOPLASTY ring is present at the MV anulus. The mitral valve has been repaired/replaced. There is mild thickening of the mitral valve leaflet(s). There is mild calcification of the mitral valve leaflet(s). Mild mitral annular calcification. No evidence of mitral valve regurgitation. There is a MEMO 3D SMD30 prosthetic annuloplasty ring present in the mitral position. No evidence of mitral valve stenosis. MV peak gradient, 6.4 mmHg. The mean mitral valve gradient is 3.0 mmHg. Tricuspid Valve: The tricuspid valve is abnormal. Tricuspid valve regurgitation is mild . No evidence of tricuspid stenosis. Aortic Valve: The aortic valve  has an indeterminant number of cusps. There is mild calcification of the aortic valve. There is mild thickening of the aortic valve. There is mild aortic valve annular calcification. Aortic valve regurgitation is not visualized. No aortic stenosis is present. Aortic valve mean gradient measures 2.0 mmHg. Aortic valve peak gradient measures 3.0 mmHg. Aortic valve area, by VTI measures 2.03 cm. Pulmonic Valve: The pulmonic valve was not well visualized. Pulmonic valve regurgitation is not visualized. No  evidence of pulmonic stenosis. Aorta: The aortic root and ascending aorta are structurally normal, with no evidence of dilitation. Venous: The inferior vena cava was not well visualized. IAS/Shunts: The interatrial septum was not well visualized. Additional Comments: A device lead is visualized in the right atrium and right ventricle.  LEFT VENTRICLE PLAX 2D LVIDd:         5.70 cm LVIDs:         4.80 cm LV PW:         1.25 cm LV IVS:        1.30 cm LVOT diam:     1.80 cm LV SV:         29 LV SV Index:   12 LVOT Area:     2.54 cm  LEFT ATRIUM             Index        RIGHT ATRIUM           Index LA Vol (A2C):   66.8 ml 27.59 ml/m  RA Area:     22.40 cm LA Vol (A4C):   52.0 ml 21.48 ml/m  RA Volume:   66.00 ml  27.26 ml/m LA Biplane Vol: 58.7 ml 24.25 ml/m  AORTIC VALVE AV Area (Vmax):    1.90 cm AV Area (Vmean):   1.81 cm AV Area (VTI):     2.03 cm AV Vmax:           86.60 cm/s AV Vmean:          64.700 cm/s AV VTI:            0.143 m AV Peak Grad:      3.0 mmHg AV Mean Grad:      2.0 mmHg LVOT Vmax:         64.70 cm/s LVOT Vmean:        46.000 cm/s LVOT VTI:          0.114 m LVOT/AV VTI ratio: 0.80  AORTA Ao Root diam: 3.60 cm Ao Asc diam:  3.50 cm MITRAL VALVE                TRICUSPID VALVE MV Area (PHT): 2.60 cm     TR Peak grad:   22.5 mmHg MV Area VTI:   1.09 cm     TR Vmax:        237.00 cm/s MV Peak grad:  6.4 mmHg MV Mean grad:  3.0 mmHg     SHUNTS MV Vmax:       1.26 m/s     Systemic VTI:  0.11 m MV  Vmean:      74.9 cm/s    Systemic Diam: 1.80 cm MV Decel Time: 292 msec MV E velocity: 115.00 cm/s Wayne Ross MD Electronically signed by Wayne Ross MD Signature Date/Time: 07/17/2024/12:38:23 PM    Final    US  Venous Img Lower Unilateral Right Result Date: 07/16/2024 CLINICAL DATA:  Right lower extremity edema. EXAM: RIGHT LOWER EXTREMITY VENOUS DOPPLER ULTRASOUND TECHNIQUE: Gray-scale sonography with graded compression, as well as color Doppler and duplex ultrasound were performed to evaluate the lower extremity deep venous systems from the level of the common femoral vein and including the common femoral, femoral, profunda femoral, popliteal and calf veins including the posterior tibial, peroneal and gastrocnemius veins when visible. The superficial great saphenous vein was also interrogated. Spectral Doppler was utilized to evaluate flow at rest and with distal augmentation maneuvers in the common femoral, femoral and  popliteal veins. COMPARISON:  None Available. FINDINGS: Contralateral Common Femoral Vein: Respiratory phasicity is normal and symmetric with the symptomatic side. No evidence of thrombus. Normal compressibility. Common Femoral Vein: No evidence of thrombus. Normal compressibility, respiratory phasicity and response to augmentation. Saphenofemoral Junction: No evidence of thrombus. Normal compressibility and flow on color Doppler imaging. Profunda Femoral Vein: No evidence of thrombus. Normal compressibility and flow on color Doppler imaging. Femoral Vein: No evidence of thrombus. Normal compressibility, respiratory phasicity and response to augmentation. Popliteal Vein: No evidence of thrombus. Normal compressibility, respiratory phasicity and response to augmentation. Calf Veins: No evidence of thrombus. Normal compressibility and flow on color Doppler imaging. Superficial Great Saphenous Vein: No evidence of thrombus. Normal compressibility. Venous Reflux:  None. Other Findings: No  evidence of superficial thrombophlebitis or abnormal fluid collection. IMPRESSION: No evidence of right lower extremity deep venous thrombosis. Electronically Signed   By: Marcey Moan M.D.   On: 07/16/2024 11:46   DG Chest 2 View Result Date: 07/16/2024 EXAM: 2 VIEW(S) XRAY OF THE CHEST 07/16/2024 10:36:00 AM COMPARISON: 04/19/2024 CLINICAL HISTORY: SOB, CP FINDINGS: LINES, TUBES AND DEVICES: Left-sided defibrillator is unchanged. LUNGS AND PLEURA: Central pulmonary vascular congestion. Elevated right hemidiaphragm. Minimal bibasilar subsegmental atelectasis. No pleural effusion. No pneumothorax. HEART AND MEDIASTINUM: Stable cardiomegaly. Left-sided defibrillator is unchanged. Status post coronary artery bypass graft and cardiac valve repair. BONES AND SOFT TISSUES: No acute osseous abnormality. IMPRESSION: 1. Stable cardiomegaly and central pulmonary vascular congestion. 2. Elevated right hemidiaphragm. 3. Minimal bibasilar subsegmental atelectasis. Electronically signed by: Lynwood Seip MD 07/16/2024 10:55 AM EST RP Workstation: [id number]        Scheduled Meds:  apixaban   2.5 mg Oral BID   carvedilol   12.5 mg Oral BID WC   furosemide   80 mg Intravenous BID   gabapentin   300 mg Oral QHS   insulin  aspart  0-15 Units Subcutaneous TID WC   insulin  aspart  0-5 Units Subcutaneous QHS   insulin  glargine-yfgn  10 Units Subcutaneous QHS   melatonin  6 mg Oral QHS   pantoprazole   40 mg Oral Daily   potassium chloride  SA  40 mEq Oral TID   rosuvastatin   5 mg Oral Daily   sodium chloride  flush  3 mL Intravenous Q12H   Continuous Infusions:  sodium chloride        LOS: 1 day    Time spent: 55 minutes    Wayne Weckerly JONETTA Fairly, DO [facility]  If 7PM-7AM, please contact night-coverage [email or web address] 07/17/2024, 2:04 PM

## 2024-07-17 NOTE — Progress Notes (Incomplete)
 Attending Note  Patient seen and discussed with PA Sheron, I agree with her documentation. History of chronic HFrEF, prior MV repair, CAD with prior CABG, DM2, HTN, ICD/CRT-D, CKD, persistent afib admitted with SOB.  Seen in [facility] clinic yesterday by Dr Mallipeddi. Reported 18 lbs weight gain over the last 3-4 weeks with SOB, leg edema.       WBC 7.4 Hgb 13.6 Plt 204 K 3.1 BUN 31  Cr 2.40 proBNP 4561 Mg 2.5  Trop 94-->85 EKG Vpaced CXR central pulm congestion RIght LE venous US : no DVT 03/2024 echo: LVEF 20-25%, indet diastolic, D shaped septum, mild RV dysfunction. Normal MV repair 06/2024 echo: LVE 30-35%, indet diastolic fxn, D shaped septum. RV poorly visualized, grossly appears enlarged with systolicdysfunction. Normal MV repair.    1.Acute on chronic HFrEF - 03/2024 echo: LVEF 20-25%, indet diastolic, D shaped septum, mild RV dysfunction. Normal MV repair 06/2024 echo: LVE 30-35%, indet diastolic fxn, D shaped septum. RV poorly visualized, grossly appears enlarged with systolicdysfunction. Normal MV repair.  - CXR central pulm congestion, proBNP 4561  Received IV lasix  80mg  x 1 yesterday, scheduled for 80mg  bid dosing today. Incomplete I/Os, roughly 1.8 L of uop overnight. Weight 258 from clinic---> pending standing weight, bed weight appears inaccurate. Downtrend in Cr with diuresis consistent with venous congestion and HF. Continue IV diuresis    - medical therapy limited by CKD with GFR 27, history of angioedema on ACEi. Avoid ACE/ARB/ARNI/MRA/SGLTi - he is on coreg  12.5mg  bid. BP's soft at times, would not inititate hydral/nitrates  2.Afib - continue coreg , eliquis    3. CAD with prior CABG - no acute issues   Dorn Ross MD

## 2024-07-17 NOTE — Progress Notes (Cosign Needed Addendum)
   Patient Saturations on Room Air at Rest = 94%  Patient Saturations on Room Air while Ambulating = 94%  Pt ambulated with assistance of front wheel walker. Pt ambulated 100 feet tolerated well with no complaints of SOB or pain.

## 2024-07-17 NOTE — Plan of Care (Signed)

## 2024-07-17 NOTE — TOC Initial Note (Signed)
 Transition of Care [facility]) - Initial/Assessment Note    Patient Details  Name: Wayne Gordon MRN: [id number] Date of Birth: 1934-01-02  Transition of Care [facility]) CM/SW Contact:    Sharlyne Stabs, RN Phone Number: 07/17/2024, 9:51 AM  Clinical Narrative:    Patient admitted with acute on chronic heart failure. Patient has a high risk for readmission. Patient discharged last admission to [facility] and completed HHPT with [facility]. Lives at home with his wife. Medical work up continues. IPCM following or discharge plan.                Expected Discharge Plan: Home w Home Health Services Barriers to Discharge: Continued Medical Work up   Patient Goals and CMS Choice Patient states their goals for this hospitalization and ongoing recovery are:: return home CMS Medicare.gov Compare Post Acute Care list provided to:: Patient Choice offered to / list presented to : Patient [place] ownership interest in [facility].provided to:: Patient   Expected Discharge Plan and Services      Living arrangements for the past 2 months: Single Family Home                   Prior Living Arrangements/Services Living arrangements for the past 2 months: Single Family Home Lives with:: Spouse   Do you feel safe going back to the place where you live?: Yes          Current home services: DME    Activities of Daily Living   ADL Screening (condition at time of admission) Independently performs ADLs?: Yes (appropriate for developmental age) Is the patient deaf or have difficulty hearing?: No Does the patient have difficulty seeing, even when wearing glasses/contacts?: No Does the patient have difficulty concentrating, remembering, or making decisions?: No  Permission Sought/Granted            Permission granted to share info w Relationship: wife     Emotional Assessment     Affect (typically observed): Accepting Orientation: : Oriented to Self, Oriented to Place, Oriented to  Time,  Oriented to Situation Alcohol / Substance Use: Not Applicable Psych Involvement: No (comment)  Admission diagnosis:  Failure of outpatient treatment [Z78.9] Acute on chronic HFrEF (heart failure with reduced ejection fraction) (HCC) [I50.23] Acute on chronic congestive heart failure, unspecified heart failure type [facility]) [I50.9] Patient Active Problem List   Diagnosis Date Noted   CKD (chronic kidney disease) stage 4, GFR 15-29 ml/min (HCC) 07/16/2024   Hypertension associated with stage 4 chronic kidney disease due to type 2 diabetes mellitus (HCC) 04/30/2024   Hyperlipidemia associated with type 2 diabetes mellitus (HCC) 04/30/2024   Diabetic peripheral neuropathy (HCC) 04/30/2024   CKD stage 4 due to type 2 diabetes mellitus (HCC) 04/30/2024   DM (diabetes mellitus), type 2 with peripheral vascular complications (HCC) 04/27/2024   Neurocognitive deficits 04/27/2024   Lobar pneumonia 04/23/2024   Acute metabolic encephalopathy 04/22/2024   Acute renal failure superimposed on stage 4 chronic kidney disease (HCC) 04/17/2024   Acute on chronic HFrEF (heart failure with reduced ejection fraction) (HCC) 04/17/2024   Acute combined systolic and diastolic heart failure (HCC) 04/16/2024   Chronic kidney disease (CKD), stage IV (severe) (HCC) 04/16/2024   Abnormal weight gain 04/16/2024   Hypokalemia 04/16/2024   Atrial flutter (HCC) 04/01/2024   Acute CHF (congestive heart failure) (HCC) 03/29/2024   Human metapneumovirus pneumonia 10/16/2021   Acute on chronic systolic CHF (congestive heart failure) (HCC) 10/16/2021   CHF exacerbation (HCC) 10/15/2021  CAD (coronary artery disease) of artery bypass graft 10/15/2021   History of implantable cardioverter-defibrillator (ICD) placement 10/15/2021   Benign essential HTN 10/15/2021   Chronic kidney disease, stage 3b (HCC) 10/15/2021   Acute respiratory failure with hypoxia (HCC) 10/14/2021   Bloating 02/27/2020   Nausea with vomiting  02/27/2020   Abdominal pain 02/27/2020   Iron deficiency anemia 02/08/2018   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   Bacteremia due to Klebsiella pneumoniae 09/22/2017   Sepsis due to Klebsiella pneumoniae (HCC) 09/22/2017   Cholangitis (HCC) 09/17/2017   Leukocytosis 09/17/2017   Cholelithiasis 09/17/2017   Elevated liver enzymes 09/17/2017   Acute renal failure superimposed on stage 3 chronic kidney disease (HCC) 09/17/2017   Low blood pressure reading 09/17/2017   Lactic acidosis 09/17/2017   Elevated troponin 09/17/2017   Nausea without vomiting 01/10/2017   Gastrointestinal hemorrhage associated with intestinal diverticulosis 06/23/2016   Carotid artery disease    Syncope and collapse 07/11/2015   Ventricular arrhythmia 07/10/2015   Complete heart block (HCC)    ICD (implantable cardioverter-defibrillator) discharge    Atrial fibrillation (HCC) 06/04/2015   Gonadotropin deficiency 06/02/2015   GERD (gastroesophageal reflux disease) 02/18/2015   Abdominal pain, epigastric 02/18/2015   Pleural effusion 09/01/2014   Rectal bleeding 03/25/2014   Acute blood loss anemia 03/25/2014   Adult body mass index 28.0-28.9 09/30/2013   Diabetes mellitus, type 2 (HCC) 08/09/2013   PVC's (premature ventricular contractions) 07/04/2013   Hyperlipidemia 01/03/2013   Presence of cardiac resynchronization therapy defibrillator (CRT-D) 03/30/2012   S/P MVR (mitral valve repair) 01/16/2012   HTN (hypertension) 11/28/2011   Chronic systolic heart failure (HCC) 10/31/2011   Ischemic cardiomyopathy 10/31/2011   CKD (chronic kidney disease) stage 3, GFR 30-59 ml/min (HCC) 10/31/2011   Current use of long term anticoagulation 10/31/2011   Long term (current) use of anticoagulants 10/28/2011   S/P mitral valve repair 10/17/2011   S/P CABG x 3 10/17/2011   Mitral regurgitation 10/12/2011   NSTEMI (non-ST elevated myocardial infarction) (HCC) 10/11/2011   PCP:  Renato Dorothey HERO, NP Pharmacy:    [facility] [place], [place] - [address] [place] [address] Phone: [phone] Fax: [phone]  [facility] - [place], [place] - [address] [place] [address] Phone: [phone] Fax: [phone]  [facility] - [place], [place] - [address] [place] [address] Phone: [phone] Fax: [phone]     Social Drivers of Health (SDOH) Social History: SDOH Screenings   Food Insecurity: Patient Declined (07/16/2024)  Housing: Patient Declined (07/16/2024)  Transportation Needs: Patient Declined (07/16/2024)  Utilities: Patient Declined (07/16/2024)  Financial Resource Strain: Medium Risk (06/30/2024)   Received from [facility]  Physical Activity: Inactive (06/30/2024)   Received from [facility]  Social Connections: Patient Declined (07/16/2024)  Stress: No Stress Concern Present (06/30/2024)   Received from [facility]  Tobacco Use: Low Risk  (07/16/2024)  Health Literacy: Low Risk (04/06/2020)   Received from [facility]   SDOH Interventions:     Readmission Risk Interventions    07/17/2024    9:49 AM 04/25/2024   10:48 AM 04/17/2024   11:19 AM  Readmission Risk Prevention Plan  Transportation Screening Complete Complete Complete  PCP or Specialist Appt within 3-5 Days Not Complete    Home Care Screening   Complete  Medication Review (RN CM)   Complete  HRI or Home Care Consult Complete Complete   Social Work Consult for Recovery Care Planning/Counseling Complete Complete   Palliative Care Screening Not Applicable Not Applicable   Medication Review Oceanographer) Complete Complete

## 2024-07-17 NOTE — Consult Note (Addendum)
 Cardiology Consultation   Patient ID: [name] MRN: [id number]; DOB: 1933/09/16  Admit date: 07/16/2024 Date of Consult: 07/17/2024  PCP:  Renato Dorothey CHRISTELLA, NP   [facility] Providers Cardiologist:  Diannah SHAUNNA Maywood, MD      Patient Profile: Wayne Gordon is a 89 y.o. male with a hx of CAD s/p CABG x 3 with MVR 2013, ICM with baseline EF 20-25%, VT, sinus node dysfunction s/p ICD 2013 with upgrade of Biotronik BiV ICD 09/2020, persistent atrial fibrillation/flutter s/p successful DCCV 04/01/2024, HTN, HLD, DM II, stage III CKD, Carotid Artery disease,  vertigo chronic HFrEF (EF 20 to 25% in 03/2024),  who is being seen 07/17/2024 for the evaluation of acute HF at the request of Dr. Maree.  History of Present Illness: [name] hospitalized August 2025 d/t acute HFrEF and also noted acute weakness, as well as severe deconditioning, frail and significant peripheral edema.  Hospital course noted by acute on chronic renal failure in part due to cardiorenal syndrome, developed new oxygen  requirement on 2 L.  Palliative care was consulted, patient became DNR.  Creatinine levels did start to downtrend, cardiology was consulted and recommended to continue oral torsemide  40 mg daily.   Seen in heart care office 07/16/2024 with Dr. Mallipeddi.  Reported SOB with exertion for 2 to 3 weeks, bilateral leg swelling.  Noted 18 pound weight gain in the last 4 weeks.  His torsemide  was increased from 20 to 40 mg once daily about 1 week ago without any changes in symptoms or weight.  Recommended ER visit for IV diuresis for ADHF, IV Lasix  80 mg twice daily, RLE US  Doppler to rule out DVT, repeat echo  Presented to [facility] 11/18 for DOE, LE edema, abdominal girth and 18 pound weight gain. K3.1 > 3.4, Mg 2.5 > 2.4, CR 2.4 > 2.29, proBNP 4561, TN 94 > 85, CBC WNL EKG: AV dual paced, HR 80's, baseline artifact, no ischemic changes Tele: AV paced, frequent PVC, HR 70-80's  CXR showed stable  cardiomegaly and vascular congestion RLE US  venous doppler negative for DVT.  Echo pending.  Treated with IV Lasix  80 mg x3.  On interview, patient reports SOB with minimal exertion for 2 to 3 weeks and bilateral leg swelling with right greater than left.  Patient states he was already on 40 mg Torsemide  daily and increased to 80 mg for the past week with no significant changes in symptoms.  With IV diuresis, reports SOB has significantly improved however no changes in LE edema.  Denies any chest pain, palpitations, dizziness, orthopnea, syncope.  Endorses that since last hospitalization he eats sausage every morning for breakfast.  He drinks approximately 48 ounces of water  daily.  Reports compliance with medications.  Past Medical History:  Diagnosis Date   Allergy to ACE inhibitors    Angioedema many years ago; patient has tolerated Losartan  (ARB) in the past - it was held during admisson for GI bleed and worsening renal function >> resume Losartan  25 mg QD 06/2015   Arthritis    Atrial fibrillation (HCC)    PAF, CHADs2Vasc = 5   Carotid artery disease    60-79% LICA   CHF (congestive heart failure) (HCC)    CKD (chronic kidney disease)    Coronary artery disease    Diabetes mellitus    GERD (gastroesophageal reflux disease)    Hyperlipidemia    Hypertension    ICD (implantable cardiac defibrillator) in place 03/28/2012   Biotronik, Dr.  Waddell 03/28/12   Ischemic cardiomyopathy    S/P CABG x 3; EF 25%   Mitral regurgitation    S/P mitral valve repair 2013   Myocardial infarction [facility])    Renal insufficiency     Past Surgical History:  Procedure Laterality Date   BIV UPGRADE N/A 10/12/2020   Procedure: BIV ICD UPGRADE;  Surgeon: Waddell Danelle ORN, MD;  Location: [facility] INVASIVE CV LAB;  Service: Cardiovascular;  Laterality: N/A;   CARDIOVERSION N/A 04/01/2024   Procedure: CARDIOVERSION;  Surgeon: Lonni Slain, MD;  Location: [facility] INVASIVE CV LAB;  Service: Cardiovascular;   Laterality: N/A;   CHOLECYSTECTOMY N/A 09/20/2017   Procedure: LAPAROSCOPIC SUBTOTAL CHOLECYSTECTOMY;  Surgeon: Rubin Calamity, MD;  Location: [facility] OR;  Service: General;  Laterality: N/A;   COLONOSCOPY N/A 04/24/2014   RMR Pancolonic diverticulosis   CORONARY ARTERY BYPASS GRAFT  10/17/2011   Procedure: CORONARY ARTERY BYPASS GRAFTING (CABG);  Surgeon: Sudie VEAR Laine, MD;  Location: [facility] OR;  Service: Open Heart Surgery;  Laterality: N/A;   ERCP N/A 09/29/2017   Procedure: ENDOSCOPIC RETROGRADE CHOLANGIOPANCREATOGRAPHY (ERCP);  Surgeon: Rollin Dover, MD;  Location: [facility] ENDOSCOPY;  Service: Endoscopy;  Laterality: N/A;   ESOPHAGOGASTRODUODENOSCOPY N/A 04/24/2014   RMR Subtle nodularity the gastric mucosa of uncertain significance-status post biopsy. Hiatal hernia. chronic inflammation, no H.pylori   ESOPHAGOGASTRODUODENOSCOPY N/A 06/24/2016   Dr. Shaaron: normal esophagus, small hiatal hernia, normal duodenum   ICD  03/28/2012   IMPLANTABLE CARDIOVERTER DEFIBRILLATOR IMPLANT N/A 03/28/2012   Procedure: IMPLANTABLE CARDIOVERTER DEFIBRILLATOR IMPLANT;  Surgeon: Danelle ORN Waddell, MD;  Location: [facility] CATH LAB;  Service: Cardiovascular;  Laterality: N/A;   KNEE ARTHROSCOPY  ~ 2008   right   LEFT AND RIGHT HEART CATHETERIZATION WITH CORONARY ANGIOGRAM N/A 10/12/2011   Procedure: LEFT AND RIGHT HEART CATHETERIZATION WITH CORONARY ANGIOGRAM;  Surgeon: Deatrice DELENA Cage, MD;  Location: [facility] CATH LAB;  Service: Cardiovascular;  Laterality: N/A;   MITRAL VALVE REPAIR  10/17/2011   Procedure: MITRAL VALVE REPAIR (MVR);  Surgeon: Sudie VEAR Laine, MD;  Location: [facility] OR;  Service: Open Heart Surgery;  Laterality: N/A;   NASAL SINUS SURGERY  1990's   right     Home Medications:  Prior to Admission medications   Medication Sig Start Date End Date Taking? Authorizing Provider  apixaban  (ELIQUIS ) 2.5 MG TABS tablet Take 1 tablet (2.5 mg total) by mouth 2 (two) times daily. 05/09/24  Yes Landy Barnie RAMAN, NP  carvedilol  (COREG )  12.5 MG tablet Take 12.5 mg by mouth 2 (two) times daily with a meal.   Yes [provider]  dapagliflozin  propanediol (FARXIGA ) 10 MG TABS tablet Take 1 tablet (10 mg total) by mouth daily. Patient taking differently: Take 10 mg by mouth at bedtime. 05/23/24  Yes Waddell Danelle ORN, MD  gabapentin  (NEURONTIN ) 100 MG capsule Take 3 capsules (300 mg total) by mouth at bedtime. Patient taking differently: Take 100 mg by mouth at bedtime. 05/09/24  Yes Landy Barnie RAMAN, NP  guaiFENesin  (ROBITUSSIN) 100 MG/5ML liquid Take 5 mLs by mouth every 4 (four) hours as needed for cough or to loosen phlegm. 04/25/24  Yes Shah, Pratik D, DO  HUMALOG  KWIKPEN 100 UNIT/ML KwikPen Inject 4-10 Units into the skin 2 (two) times daily. 4 units lunch, 10 units dinner Patient taking differently: Inject 10 Units into the skin daily with supper. 05/09/24  Yes Landy Barnie RAMAN, NP  insulin  glargine, 1 Unit Dial , (TOUJEO  SOLOSTAR) 300 UNIT/ML Solostar Pen Inject 26 Units into the skin  daily. Patient taking differently: Inject 26 Units into the skin every evening. 2000 05/09/24  Yes Landy Barnie RAMAN, NP  melatonin 5 MG TABS Take 5 mg by mouth at bedtime.   Yes [provider]  omeprazole  (PRILOSEC) 40 MG capsule Take 1 capsule (40 mg total) by mouth daily before breakfast. 05/09/24  Yes Landy Barnie RAMAN, NP  rosuvastatin  (CRESTOR ) 5 MG tablet Take 1 tablet (5 mg total) by mouth daily. Patient taking differently: Take 5 mg by mouth at bedtime. 05/09/24  Yes Landy Barnie RAMAN, NP  torsemide  (DEMADEX ) 20 MG tablet Take 40 mg by mouth daily.   Yes [provider]    Scheduled Meds:  apixaban   2.5 mg Oral BID   carvedilol   12.5 mg Oral BID WC   furosemide   80 mg Intravenous BID   gabapentin   300 mg Oral QHS   insulin  aspart  0-15 Units Subcutaneous TID WC   insulin  aspart  0-5 Units Subcutaneous QHS   insulin  glargine-yfgn  10 Units Subcutaneous QHS   melatonin  6 mg Oral QHS   pantoprazole   40 mg Oral Daily    potassium chloride  SA  40 mEq Oral TID   rosuvastatin   5 mg Oral Daily   sodium chloride  flush  3 mL Intravenous Q12H   Continuous Infusions:  sodium chloride      PRN Meds: sodium chloride , acetaminophen , ondansetron  (ZOFRAN ) IV, perflutren  lipid microspheres (DEFINITY ) IV suspension, sodium chloride  flush  Allergies:    Allergies  Allergen Reactions   Ace Inhibitors Swelling and Other (See Comments)    Kidney issues; angioedema per PMH in chart   Penicillins Palpitations    Social History:   Social History   Socioeconomic History   Marital status: Married    Spouse name: Not on file   Number of children: Not on file   Years of education: Not on file   Highest education level: Some college, no degree  Occupational History   Occupation: Retired  Tobacco Use   Smoking status: Never   Smokeless tobacco: Never  Vaping Use   Vaping status: Never Used  Substance and Sexual Activity   Alcohol use: No    Alcohol/week: 0.0 standard drinks of alcohol   Drug use: No   Sexual activity: Yes  Other Topics Concern   Not on file  Social History Narrative   Not on file   Family History:   Family History  Problem Relation Age of Onset   Heart disease Mother    Diabetes Father    Cardiomyopathy Father    Colon cancer Neg Hx      ROS:  Please see the history of present illness.  All other ROS reviewed and negative.     Physical Exam/Data: Vitals:   07/17/24 0505 07/17/24 0508 07/17/24 0710 07/17/24 0953  BP: 100/66  115/73   Pulse: 86  90   Resp: 18     Temp: (!) 97.5 F (36.4 C)  97.7 F (36.5 C)   TempSrc: Oral  Oral   SpO2: 94%  90%   Weight:  111.8 kg  112 kg  Height:    6' 6 (1.981 m)    Intake/Output Summary (Last 24 hours) at 07/17/2024 1040 Last data filed at 07/17/2024 0952 Gross per 24 hour  Intake 240 ml  Output 2475 ml  Net -2235 ml      07/17/2024    9:53 AM 07/17/2024    5:08 AM 07/16/2024   10:13 AM  Last  3 Weights  Weight (lbs) 246  lb 14.6 oz 246 lb 7.6 oz 245 lb  Weight (kg) 112 kg 111.8 kg 111.131 kg     Body mass index is 28.53 kg/m.  General:  Well nourished, well developed, in no acute distress HEENT: normal Neck: no JVD Vascular: No carotid bruits; Distal pulses 2+ bilaterally Cardiac:  normal S1, S2; RRR; no murmur  Lungs:  clear to auscultation bilaterally, no wheezing, rhonchi or rales  Abd: soft, nontender, no hepatomegaly  Ext: 2+ LE edema with R> L  Musculoskeletal:  No deformities, BUE and BLE strength normal and equal Skin: warm and dry  Neuro:  CNs 2-12 intact, no focal abnormalities noted Psych:  Normal affect   EKG:  The EKG was personally reviewed and demonstrates:  AV dual paced, HR 80's, baseline artifact, no ischemic changes Telemetry:  Telemetry was personally reviewed and demonstrates: AV paced, HR 70-80s, frequent PVCs  Relevant CV Studies: ECHO pending  ECHO IMPRESSIONS 07/16/2024  1. No LV thrombus by Definity . Left ventricular ejection fraction, by  estimation, is 20 to 25%. The left ventricle has severely decreased  function. The left ventricle demonstrates global hypokinesis. The left  ventricular internal cavity size was mildly  dilated. There is mild concentric left ventricular hypertrophy. Left  ventricular diastolic parameters are indeterminate. There is the  interventricular septum is flattened in systole and diastole, consistent  with right ventricular pressure and volume  overload.   2. Right ventricular systolic function is mildly reduced. The right  ventricular size is moderately enlarged. There is normal pulmonary artery  systolic pressure. The estimated right ventricular systolic pressure is  32.8 mmHg.   3. Left atrial size was mildly dilated.   4. S/p Sorin Memo 3D ring annuloplasty (size 30mm, Catalog #[id number], serial  [id number]). The mitral valve has been repaired/replaced. Mild mitral valve  regurgitation. No evidence of mitral stenosis. The mean mitral valve   gradient is 3.0 mmHg with average  heart rate of 85 bpm. Procedure Date: 10/17/11. Echo findings are  consistent with normal structure and function of the mitral valve  prosthesis.   5. The tricuspid valve is abnormal. Tricuspid valve regurgitation is  moderate.   6. The aortic valve is tricuspid. There is moderate calcification of the  aortic valve. There is moderate thickening of the aortic valve. Aortic  valve regurgitation is trivial. Aortic valve sclerosis/calcification is  present, without any evidence of  aortic stenosis.   7. The inferior vena cava is normal in size with <50% respiratory  variability, suggesting right atrial pressure of 8 mmHg.   Laboratory Data: High Sensitivity Troponin:  No results for input(s): TROPONINIHS in the last 720 hours.   Chemistry Recent Labs  Lab 07/11/24 1115 07/16/24 1053 07/17/24 0419  NA 140 139 142  K 4.0 3.1* 3.4*  CL 97 96* 99  CO2 29 30 29   GLUCOSE 124* 128* 93  BUN 25 31* 30*  CREATININE 2.34* 2.40* 2.29*  CALCIUM  8.8 8.9 8.8*  MG  --  2.5* 2.4  GFRNONAA  --  25* 27*  ANIONGAP  --  13 13    Recent Labs  Lab 07/16/24 1053  PROT 6.7  ALBUMIN  4.0  AST 26  ALT 10  ALKPHOS 89  BILITOT 1.3*   Lipids No results for input(s): CHOL, TRIG, HDL, LABVLDL, LDLCALC, CHOLHDL in the last 168 hours.  Hematology Recent Labs  Lab 07/16/24 1053  WBC 7.4  RBC 4.85  HGB 13.6  HCT 42.7  MCV 88.0  MCH 28.0  MCHC 31.9  RDW 16.3*  PLT 204   Thyroid No results for input(s): TSH, FREET4 in the last 168 hours.  BNP Recent Labs  Lab 07/16/24 1053  PROBNP 4,561.0*    DDimer No results for input(s): DDIMER in the last 168 hours.  Radiology/Studies:  US  Venous Img Lower Unilateral Right Result Date: 07/16/2024 CLINICAL DATA:  Right lower extremity edema. EXAM: RIGHT LOWER EXTREMITY VENOUS DOPPLER ULTRASOUND TECHNIQUE: Gray-scale sonography with graded compression, as well as color Doppler and duplex ultrasound  were performed to evaluate the lower extremity deep venous systems from the level of the common femoral vein and including the common femoral, femoral, profunda femoral, popliteal and calf veins including the posterior tibial, peroneal and gastrocnemius veins when visible. The superficial great saphenous vein was also interrogated. Spectral Doppler was utilized to evaluate flow at rest and with distal augmentation maneuvers in the common femoral, femoral and popliteal veins. COMPARISON:  None Available. FINDINGS: Contralateral Common Femoral Vein: Respiratory phasicity is normal and symmetric with the symptomatic side. No evidence of thrombus. Normal compressibility. Common Femoral Vein: No evidence of thrombus. Normal compressibility, respiratory phasicity and response to augmentation. Saphenofemoral Junction: No evidence of thrombus. Normal compressibility and flow on color Doppler imaging. Profunda Femoral Vein: No evidence of thrombus. Normal compressibility and flow on color Doppler imaging. Femoral Vein: No evidence of thrombus. Normal compressibility, respiratory phasicity and response to augmentation. Popliteal Vein: No evidence of thrombus. Normal compressibility, respiratory phasicity and response to augmentation. Calf Veins: No evidence of thrombus. Normal compressibility and flow on color Doppler imaging. Superficial Great Saphenous Vein: No evidence of thrombus. Normal compressibility. Venous Reflux:  None. Other Findings: No evidence of superficial thrombophlebitis or abnormal fluid collection. IMPRESSION: No evidence of right lower extremity deep venous thrombosis. Electronically Signed   By: Marcey Moan M.D.   On: 07/16/2024 11:46   DG Chest 2 View Result Date: 07/16/2024 EXAM: 2 VIEW(S) XRAY OF THE CHEST 07/16/2024 10:36:00 AM COMPARISON: 04/19/2024 CLINICAL HISTORY: SOB, CP FINDINGS: LINES, TUBES AND DEVICES: Left-sided defibrillator is unchanged. LUNGS AND PLEURA: Central pulmonary  vascular congestion. Elevated right hemidiaphragm. Minimal bibasilar subsegmental atelectasis. No pleural effusion. No pneumothorax. HEART AND MEDIASTINUM: Stable cardiomegaly. Left-sided defibrillator is unchanged. Status post coronary artery bypass graft and cardiac valve repair. BONES AND SOFT TISSUES: No acute osseous abnormality. IMPRESSION: 1. Stable cardiomegaly and central pulmonary vascular congestion. 2. Elevated right hemidiaphragm. 3. Minimal bibasilar subsegmental atelectasis. Electronically signed by: Lynwood Seip MD 07/16/2024 10:55 AM EST RP Workstation: [id number]     Assessment and Plan: Acute on chronic systolic and diastolic HF ECHO 09/04/7972 LVEF 20 to 25%, severely decreased LV function, global hypokinesis, mildly dilated LV, mildly concentric LVH, C/W RV overload, mildly reduced RV function, moderately enlarged RV, mildly dilated LA. ECHO pending.  Presents with SOB with minimal exertion for 2 to 3 weeks, bilateral leg swelling with right greater than left, abdominal girth, and 18 pound weight gain. With IV diuresis, reports SOB has significantly improved however no changes in LE edema. proBNP 4561. CXR showed stable cardiomegaly and vascular congestion Treated with IV Lasix  80 mg x3.  Still appears volume overloaded on exam Net I/O: -2234, Wt 246 > repeat pending (discharge weight 03/2024: 235 lbs), Cr 2.4 > 2.29. Red Vest Value of 31. Continue with strict I/O, daily standing weights if possible, and monitoring renal function.   Continue on IV lasix  80 mg BID. If no significant improvement then can consider Lasix  drip.  Continue carvedilol  12.5 mg twice daily. Continue PTA once euvolemic: Farxiga  10 mg daily  Not a candidate for ACE/ARB/ARNI/MRA due to CKD stage IIIb-IV Not a candidate for advanced heart failure therapies due to advanced age. Encouraged low sodium diet, fluid restriction <2L, and daily weights.   Hypokalemia PVC K3.1 > 3.4, Mg 2.5 > 2.4 Most likely  contributing to PVC's.  Treated with KCL supplement. Continue to monitor.     Persistent A-fib s/p DCCV in August 2025 Continue carvedilol  12.5 mg twice daily. Continue Eliquis  2.5 mg twice daily. Previously on amiodarone , but discontinued secondary to dizziness and generalized weakness   HTN  BP well controlled: 115/73  Managed by GDMT above.    CAD s/p CABG HLD, LDL goal < 55 Lexiscan  07/2015: High risk studies with findings consistent with prior MI TN 94 > 85. Most likely demand ischemia.  EKG AV dual paced, HR 80's, baseline artifact, no ischemic changes No angina.  Not on aspirin  due to Eliquis  use. Continue rosuvastatin  5 mg nightly. No need further ischemic evaluation at this time.    S/p MVR Prior echo from August 2025 showed LVEF 20 to 25%, RV mildly reduced, moderate enlargement of RV, normal mitral annuloplasty ring with mild MR, CVP 8 mmHg.   ECHO pending.    S/p CRT-D Device interrogation 04/2024: AP/VP rhythm with no treated arrhythmias  Follows with EP outpatient.  chronic CKD stage IV Baseline creatinine 2.2-2.5 monitor BMP with diuresis Secondary to cardiorenal syndrome   Risk Assessment/Risk Scores:  New [place]  Heart Association (NYHA) Functional Class NYHA Class III  CHA2DS2-VASc Score = 6   This indicates a 9.7% annual risk of stroke. The patient's score is based upon: CHF History: 1 HTN History: 1 Diabetes History: 1 Stroke History: 0 Vascular Disease History: 1 Age Score: 2 Gender Score: 0        For questions or updates, please contact [facility] Please consult [email or web address] for contact info under      Signed, Lorette CINDERELLA Kapur, PA-C  07/17/2024 10:40 AM   Attending Note  Patient seen and discussed with PA Kapur, I agree with her documentation. History of chronic HFrEF, prior MV repair, CAD with prior CABG, DM2, HTN, ICD/CRT-D, CKD, persistent afib admitted with SOB.  Seen in cards clinic yesterday by Dr Mallipeddi.  Reported 18 lbs weight gain over the last 3-4 weeks with SOB, leg edema.       WBC 7.4 Hgb 13.6 Plt 204 K 3.1 BUN 31  Cr 2.40 proBNP 4561 Mg 2.5  Trop 94-->85 EKG Vpaced CXR central pulm congestion RIght LE venous US : no DVT 03/2024 echo: LVEF 20-25%, indet diastolic, D shaped septum, mild RV dysfunction. Normal MV repair 06/2024 echo: LVE 30-35%, indet diastolic fxn, D shaped septum. RV poorly visualized, grossly appears enlarged with systolicdysfunction. Normal MV repair.    1.Acute on chronic HFrEF - 03/2024 echo: LVEF 20-25%, indet diastolic, D shaped septum, mild RV dysfunction. Normal MV repair 06/2024 echo: LVE 30-35%, indet diastolic fxn, D shaped septum. RV poorly visualized, grossly appears enlarged with systolicdysfunction. Normal MV repair.  - CXR central pulm congestion, proBNP 4561  Received IV lasix  80mg  x 1 yesterday, scheduled for 80mg  bid dosing today. Incomplete I/Os, roughly 1.8 L of uop overnight. Weight 258 from clinic---> pending standing weight, bed weight appears inaccurate. Downtrend in Cr with diuresis consistent with venous congestion and HF. Continue IV diuresis    - medical therapy limited by CKD with GFR 27, history of angioedema  on ACEi. Avoid ACE/ARB/ARNI/MRA/SGLTi - he is on coreg  12.5mg  bid. BP's soft at times, would not inititate hydral/nitrates  2.Afib - continue coreg , eliquis    3. CAD with prior CABG - no acute issues   Dorn Ross MD

## 2024-07-18 DIAGNOSIS — I5023 Acute on chronic systolic (congestive) heart failure: Secondary | ICD-10-CM | POA: Diagnosis not present

## 2024-07-18 LAB — GLUCOSE, CAPILLARY
Glucose-Capillary: 158 mg/dL — ABNORMAL HIGH (ref 70–99)
Glucose-Capillary: 183 mg/dL — ABNORMAL HIGH (ref 70–99)
Glucose-Capillary: 143 mg/dL — ABNORMAL HIGH (ref 70–99)
Glucose-Capillary: 192 mg/dL — ABNORMAL HIGH (ref 70–99)

## 2024-07-18 LAB — BASIC METABOLIC PANEL WITH GFR
Anion gap: 10 (ref 5–15)
BUN: 31 mg/dL — ABNORMAL HIGH (ref 8–23)
CO2: 32 mmol/L (ref 22–32)
Calcium: 9 mg/dL (ref 8.9–10.3)
Chloride: 100 mmol/L (ref 98–111)
Creatinine, Ser: 2.24 mg/dL — ABNORMAL HIGH (ref 0.61–1.24)
GFR, Estimated: 28 mL/min — ABNORMAL LOW (ref >60)
Glucose, Bld: 135 mg/dL — ABNORMAL HIGH (ref 70–99)
Potassium: 4.1 mmol/L (ref 3.5–5.1)
Sodium: 142 mmol/L (ref 135–145)

## 2024-07-18 LAB — MAGNESIUM: Magnesium: 2.5 mg/dL — ABNORMAL HIGH (ref 1.7–2.4)

## 2024-07-18 MED ORDER — FUROSEMIDE 10 MG/ML IJ SOLN
80.0000 mg | Freq: Three times a day (TID) | INTRAMUSCULAR | Status: DC
  Administered 2024-07-18 – 2024-07-19 (×3): 80 mg via INTRAVENOUS
  Filled 2024-07-18 (×3): qty 8

## 2024-07-18 NOTE — Plan of Care (Signed)

## 2024-07-18 NOTE — Progress Notes (Signed)
 Mobility Specialist Progress Note:    07/18/24 1235  Mobility  Activity Ambulated with assistance  Level of Assistance Standby assist, set-up cues, supervision of patient - no hands on  Assistive Device Front wheel walker  Distance Ambulated (ft) 120 ft  Range of Motion/Exercises Active;All extremities  Activity Response Tolerated well  Mobility Referral Yes  Mobility visit 1 Mobility  Mobility Specialist Start Time (ACUTE ONLY) 1235  Mobility Specialist Stop Time (ACUTE ONLY) 1255  Mobility Specialist Time Calculation (min) (ACUTE ONLY) 20 min   Pt received in chair, agreeable to mobility. Required SBA to stand and ambulate with RW. Tolerated well, stated his legs get shaky during ambulation sometimes. Returned to chair, all needs met.   Andelyn Spade Mobility Specialist Please contact via Special Educational Needs Teacher or  Rehab office at [phone]

## 2024-07-18 NOTE — Progress Notes (Signed)
 Rounding Note   Patient Name: Wayne Gordon Date of Encounter: 07/18/2024  [facility] Cardiologist: Diannah SHAUNNA Maywood, MD   Subjective No complaints  Scheduled Meds:  apixaban   2.5 mg Oral BID   carvedilol   12.5 mg Oral BID WC   furosemide   80 mg Intravenous BID   gabapentin   300 mg Oral QHS   insulin  aspart  0-15 Units Subcutaneous TID WC   insulin  aspart  0-5 Units Subcutaneous QHS   insulin  glargine-yfgn  10 Units Subcutaneous QHS   melatonin  6 mg Oral QHS   pantoprazole   40 mg Oral Daily   rosuvastatin   5 mg Oral Daily   sodium chloride  flush  3 mL Intravenous Q12H   Continuous Infusions:  PRN Meds: acetaminophen , ondansetron  (ZOFRAN ) IV, sodium chloride  flush   Vital Signs  Vitals:   07/17/24 2107 07/18/24 0418 07/18/24 0418 07/18/24 0748  BP: 109/60  111/78 106/69  Pulse: 76  88 76  Resp: (!) 22  18   Temp: 98.1 F (36.7 C)  98.5 F (36.9 C)   TempSrc: Oral  Oral   SpO2: 99%  96% 98%  Weight:  112.5 kg    Height:        Intake/Output Summary (Last 24 hours) at 07/18/2024 0944 Last data filed at 07/18/2024 0800 Gross per 24 hour  Intake 823 ml  Output 1850 ml  Net -1027 ml      07/18/2024    4:18 AM 07/17/2024    9:53 AM 07/17/2024    5:08 AM  Last 3 Weights  Weight (lbs) 248 lb 246 lb 14.6 oz 246 lb 7.6 oz  Weight (kg) 112.492 kg 112 kg 111.8 kg      Telemetry V paced - Personally Reviewed  ECG  N/a - Personally Reviewed  Physical Exam  GEN: No acute distress.   Neck: +JVD Cardiac: RRR, Respiratory:decreased breath sounds bilateral bases GI: Soft, nontender, non-distended  MS: 2+ bilateral LE edema Neuro:  Nonfocal  Psych: Normal affect   Labs High Sensitivity Troponin:  No results for input(s): TROPONINIHS in the last 720 hours.   Chemistry Recent Labs  Lab 07/16/24 1053 07/17/24 0419 07/18/24 0427  NA 139 142 142  K 3.1* 3.4* 4.1  CL 96* 99 100  CO2 30 29 32  GLUCOSE 128* 93 135*  BUN 31* 30* 31*   CREATININE 2.40* 2.29* 2.24*  CALCIUM  8.9 8.8* 9.0  MG 2.5* 2.4 2.5*  PROT 6.7  --   --   ALBUMIN  4.0  --   --   AST 26  --   --   ALT 10  --   --   ALKPHOS 89  --   --   BILITOT 1.3*  --   --   GFRNONAA 25* 27* 28*  ANIONGAP 13 13 10     Lipids No results for input(s): CHOL, TRIG, HDL, LABVLDL, LDLCALC, CHOLHDL in the last 168 hours.  Hematology Recent Labs  Lab 07/16/24 1053  WBC 7.4  RBC 4.85  HGB 13.6  HCT 42.7  MCV 88.0  MCH 28.0  MCHC 31.9  RDW 16.3*  PLT 204   Thyroid No results for input(s): TSH, FREET4 in the last 168 hours.  BNP Recent Labs  Lab 07/16/24 1053  PROBNP 4,561.0*    DDimer No results for input(s): DDIMER in the last 168 hours.   Radiology  ECHOCARDIOGRAM COMPLETE Result Date: 07/17/2024    ECHOCARDIOGRAM REPORT   Patient Name:   Wayne  CHRISTELLA Gordon Date of Exam: 07/17/2024 Medical Rec #:  [id number]      Height:       76.0 in Accession #:    [id number]     Weight:       246.5 lb Date of Birth:  Oct 24, 1933       BSA:          2.421 m Patient Age:    89 years       BP:           115/73 mmHg Patient Gender: M              HR:           75 bpm. Exam Location:  [name] Procedure: 2D Echo, Cardiac Doppler, Color Doppler and Intracardiac            Opacification Agent (Both Spectral and Color Flow Doppler were            utilized during procedure). Indications:    CHF-Acute Systolic I50.21  History:        Patient has prior history of Echocardiogram examinations, most                 recent 03/30/2024. CHF and Cardiomyopathy, CAD, Defibrillator,                 Mitral Valve Disease; Risk Factors:Hypertension and Diabetes.  Sonographer:    Jayson Gaskins Referring Phys: [phone] DAVID TAT IMPRESSIONS  1. Left ventricular ejection fraction, by estimation, is 30 to 35%. The left ventricle has moderately decreased function. The left ventricle demonstrates global hypokinesis. There is moderate left ventricular hypertrophy. Left ventricular diastolic parameters  are indeterminate. There is the interventricular septum is flattened in systole and diastole, consistent with right ventricular pressure and volume overload.  2. RV poorly visualized. Grossly appears enlarged with decreased systolic function. Indeterminant PASP, IVC poorly visualized. . Right ventricular systolic function was not well visualized. The right ventricular size is not well visualized.  3. #30 MEMO 3D RING ANNuLOPLASTY ring is present at the MV anulus. . The mitral valve has been repaired/replaced. No evidence of mitral valve regurgitation. No evidence of mitral stenosis. The mean mitral valve gradient is 3.0 mmHg.  4. The tricuspid valve is abnormal.  5. The aortic valve has an indeterminant number of cusps. There is mild calcification of the aortic valve. There is mild thickening of the aortic valve. Aortic valve regurgitation is not visualized. No aortic stenosis is present. FINDINGS  Left Ventricle: Left ventricular ejection fraction, by estimation, is 30 to 35%. The left ventricle has moderately decreased function. The left ventricle demonstrates global hypokinesis. The left ventricular internal cavity size was normal in size. There is moderate left ventricular hypertrophy. The interventricular septum is flattened in systole and diastole, consistent with right ventricular pressure and volume overload. Left ventricular diastolic parameters are indeterminate. Right Ventricle: RV poorly visualized. Grossly appears enlarged with decreased systolic function. Indeterminant PASP, IVC poorly visualized. The right ventricular size is not well visualized. Right vetricular wall thickness was not well visualized. Right  ventricular systolic function was not well visualized. Left Atrium: Left atrial size was not well visualized. Right Atrium: Right atrial size was not well visualized. Pericardium: There is no evidence of pericardial effusion. Mitral Valve: #30 MEMO 3D RING ANNuLOPLASTY ring is present at the MV  anulus. The mitral valve has been repaired/replaced. There is mild thickening of the mitral valve leaflet(s). There is mild calcification of the mitral  valve leaflet(s). Mild mitral annular calcification. No evidence of mitral valve regurgitation. There is a MEMO 3D SMD30 prosthetic annuloplasty ring present in the mitral position. No evidence of mitral valve stenosis. MV peak gradient, 6.4 mmHg. The mean mitral valve gradient is 3.0 mmHg. Tricuspid Valve: The tricuspid valve is abnormal. Tricuspid valve regurgitation is mild . No evidence of tricuspid stenosis. Aortic Valve: The aortic valve has an indeterminant number of cusps. There is mild calcification of the aortic valve. There is mild thickening of the aortic valve. There is mild aortic valve annular calcification. Aortic valve regurgitation is not visualized. No aortic stenosis is present. Aortic valve mean gradient measures 2.0 mmHg. Aortic valve peak gradient measures 3.0 mmHg. Aortic valve area, by VTI measures 2.03 cm. Pulmonic Valve: The pulmonic valve was not well visualized. Pulmonic valve regurgitation is not visualized. No evidence of pulmonic stenosis. Aorta: The aortic root and ascending aorta are structurally normal, with no evidence of dilitation. Venous: The inferior vena cava was not well visualized. IAS/Shunts: The interatrial septum was not well visualized. Additional Comments: A device lead is visualized in the right atrium and right ventricle.  LEFT VENTRICLE PLAX 2D LVIDd:         5.70 cm LVIDs:         4.80 cm LV PW:         1.25 cm LV IVS:        1.30 cm LVOT diam:     1.80 cm LV SV:         29 LV SV Index:   12 LVOT Area:     2.54 cm  LEFT ATRIUM             Index        RIGHT ATRIUM           Index LA Vol (A2C):   66.8 ml 27.59 ml/m  RA Area:     22.40 cm LA Vol (A4C):   52.0 ml 21.48 ml/m  RA Volume:   66.00 ml  27.26 ml/m LA Biplane Vol: 58.7 ml 24.25 ml/m  AORTIC VALVE AV Area (Vmax):    1.90 cm AV Area (Vmean):   1.81  cm AV Area (VTI):     2.03 cm AV Vmax:           86.60 cm/s AV Vmean:          64.700 cm/s AV VTI:            0.143 m AV Peak Grad:      3.0 mmHg AV Mean Grad:      2.0 mmHg LVOT Vmax:         64.70 cm/s LVOT Vmean:        46.000 cm/s LVOT VTI:          0.114 m LVOT/AV VTI ratio: 0.80  AORTA Ao Root diam: 3.60 cm Ao Asc diam:  3.50 cm MITRAL VALVE                TRICUSPID VALVE MV Area (PHT): 2.60 cm     TR Peak grad:   22.5 mmHg MV Area VTI:   1.09 cm     TR Vmax:        237.00 cm/s MV Peak grad:  6.4 mmHg MV Mean grad:  3.0 mmHg     SHUNTS MV Vmax:       1.26 m/s     Systemic VTI:  0.11 m MV Vmean:  74.9 cm/s    Systemic Diam: 1.80 cm MV Decel Time: 292 msec MV E velocity: 115.00 cm/s Dorn Ross MD Electronically signed by Dorn Ross MD Signature Date/Time: 07/17/2024/12:38:23 PM    Final    US  Venous Img Lower Unilateral Right Result Date: 07/16/2024 CLINICAL DATA:  Right lower extremity edema. EXAM: RIGHT LOWER EXTREMITY VENOUS DOPPLER ULTRASOUND TECHNIQUE: Gray-scale sonography with graded compression, as well as color Doppler and duplex ultrasound were performed to evaluate the lower extremity deep venous systems from the level of the common femoral vein and including the common femoral, femoral, profunda femoral, popliteal and calf veins including the posterior tibial, peroneal and gastrocnemius veins when visible. The superficial great saphenous vein was also interrogated. Spectral Doppler was utilized to evaluate flow at rest and with distal augmentation maneuvers in the common femoral, femoral and popliteal veins. COMPARISON:  None Available. FINDINGS: Contralateral Common Femoral Vein: Respiratory phasicity is normal and symmetric with the symptomatic side. No evidence of thrombus. Normal compressibility. Common Femoral Vein: No evidence of thrombus. Normal compressibility, respiratory phasicity and response to augmentation. Saphenofemoral Junction: No evidence of thrombus. Normal  compressibility and flow on color Doppler imaging. Profunda Femoral Vein: No evidence of thrombus. Normal compressibility and flow on color Doppler imaging. Femoral Vein: No evidence of thrombus. Normal compressibility, respiratory phasicity and response to augmentation. Popliteal Vein: No evidence of thrombus. Normal compressibility, respiratory phasicity and response to augmentation. Calf Veins: No evidence of thrombus. Normal compressibility and flow on color Doppler imaging. Superficial Great Saphenous Vein: No evidence of thrombus. Normal compressibility. Venous Reflux:  None. Other Findings: No evidence of superficial thrombophlebitis or abnormal fluid collection. IMPRESSION: No evidence of right lower extremity deep venous thrombosis. Electronically Signed   By: Marcey Moan M.D.   On: 07/16/2024 11:46   DG Chest 2 View Result Date: 07/16/2024 EXAM: 2 VIEW(S) XRAY OF THE CHEST 07/16/2024 10:36:00 AM COMPARISON: 04/19/2024 CLINICAL HISTORY: SOB, CP FINDINGS: LINES, TUBES AND DEVICES: Left-sided defibrillator is unchanged. LUNGS AND PLEURA: Central pulmonary vascular congestion. Elevated right hemidiaphragm. Minimal bibasilar subsegmental atelectasis. No pleural effusion. No pneumothorax. HEART AND MEDIASTINUM: Stable cardiomegaly. Left-sided defibrillator is unchanged. Status post coronary artery bypass graft and cardiac valve repair. BONES AND SOFT TISSUES: No acute osseous abnormality. IMPRESSION: 1. Stable cardiomegaly and central pulmonary vascular congestion. 2. Elevated right hemidiaphragm. 3. Minimal bibasilar subsegmental atelectasis. Electronically signed by: Lynwood Seip MD 07/16/2024 10:55 AM EST RP Workstation: [id number]      Patient Profile   Wayne Gordon is a 89 y.o. male with a hx of CAD s/p CABG x 3 with MVR 2013, ICM with baseline EF 20-25%, VT, sinus node dysfunction s/p ICD 2013 with upgrade of [employer] BiV ICD 09/2020, persistent atrial fibrillation/flutter s/p successful  DCCV 04/01/2024, HTN, HLD, DM II, stage III CKD, Carotid Artery disease,  vertigo chronic HFrEF (EF 20 to 25% in 03/2024),  who is being seen 07/17/2024 for the evaluation of acute HF at the request of Dr. Maree.   Assessment & Plan   1.Acute on chronic HFrEF - 03/2024 echo: LVEF 20-25%, indet diastolic, D shaped septum, mild RV dysfunction. Normal MV repair 06/2024 echo: LVE 30-35%, indet diastolic fxn, D shaped septum. RV poorly visualized, grossly appears enlarged with systolicdysfunction. Normal MV repair.  - CXR central pulm congestion, proBNP 4561   - on IV lasix  80mg  bid, Incomplete I/Os. Clinic weight 258 however same day hospital weight 245 lbs. Overall stable weights since admission. Downtrend in Cr with diuresis consistent with  venous congestion and HF.  - fairly limited diuresis from available data, change IV lasix  to 80mg  tid.      - medical therapy limited by CKD with GFR 27, history of angioedema on ACEi. Avoid ACE/ARB/ARNI/MRA/SGLTi - he is on coreg  12.5mg  bid. BP's soft at times, would not inititate hydral/nitrates   2.Afib - continue coreg , eliquis      3. CAD with prior CABG - no acute issues    For questions or updates, please contact [facility] Please consult [email or web address] for contact info under       Signed, Alvan Carrier, MD  07/18/2024, 9:44 AM

## 2024-07-18 NOTE — Progress Notes (Signed)
 PROGRESS NOTE    Wayne Gordon  FMW:[id number] DOB: 04-Jan-1934 DOA: 07/16/2024 PCP: Renato Dorothey HERO, NP   Brief Narrative:    Wayne Gordon is a 89 year old male with HFrEF, CAD status post CABG and mitral valve repair 2013, LVEF 20-25%, status post ICD, pacer placement, stage IV CKD, persistent atrial fibrillation/atrial flutter on chronic anticoagulation with apixaban  status post DCCV 04/01/24, type 2 diabetes mellitus on insulin , CKD 4 presenting with dyspnea on exertion for about a week.  The patient was at his cardiology office visit today.  He was noted to be fluid overloaded and has gained 18 pounds over the past month.  His office weight was 254 pounds. Patient endorses compliance with his medications.  Notably, the patient had recently had his torsemide  increased from 20 mg daily to 40 mg daily for about a week.  He was admitted with acute on chronic HFrEF and is still undergoing diuresis.  Assessment & Plan:   Principal Problem:   Acute on chronic HFrEF (heart failure with reduced ejection fraction) (HCC) Active Problems:   S/P MVR (mitral valve repair)   History of implantable cardioverter-defibrillator (ICD) placement   CKD (chronic kidney disease) stage 4, GFR 15-29 ml/min (HCC)  Assessment and Plan:   Acute on chronic HFrEF - 03/30/2024 echo EF 20-25%, global HK, mild decreased RVF, s/p MV annuloplasty -06/2024 echo with LVEF 30-35% with indeterminate diastolic function and D-shaped septum - Continue IV furosemide  now 80 mg IV 3 times daily per cardiology - Accurate I's and O's-- -overall weights have been stable since admission   Persistent atrial fibrillation - Status post DCCV 04/01/2024 - Continue carvedilol  - Continue apixaban  - Previously on amiodarone , but discontinued secondary to dizziness and generalized weakness - remains rate controlled   chronic CKD stage IV - Baseline creatinine 2.2-2.5 - monitor BMP with diuresis - Secondary to cardiorenal  syndrome   Acute respiratory failure with hypoxia-resolved - Continue to monitor   Coronary artery disease /elevated troponin - Status post CABG -Secondary to demand ischemia - No angina - continue crestor  - Currently on apixaban    Diabetes mellitus type 2, uncontrolled with hyperglycemia - 03/29/2024 hemoglobin A1c 7.7 - Continue NovoLog  sliding scale - Continue Semglee  reduced dose - He is on Toujeo  26 units daily at home   Mixed hyperlipidemia - continue crestor    Sinus node dysfunction s/p ICD 2013 with upgrade of Biotronik BiV ICD 09/2020 - Device interrogation 02/2024: AP/VP rhythm with no treated arrhythmias - Continue to follow with EP  Obesity, class I - BMI 30   DVT prophylaxis: Apixaban  Code Status: DNR Family Communication: None at bedside Disposition Plan:  Status is: Inpatient Remains inpatient appropriate because: Need for IV medications  Consultants:  Cardiology  Procedures:  None  Antimicrobials:  None   Subjective: Patient seen and evaluated today with no new acute complaints or concerns. No acute concerns or events noted overnight.  Objective: Vitals:   07/17/24 2107 07/18/24 0418 07/18/24 0418 07/18/24 0748  BP: 109/60  111/78 106/69  Pulse: 76  88 76  Resp: (!) 22  18   Temp: 98.1 F (36.7 C)  98.5 F (36.9 C)   TempSrc: Oral  Oral   SpO2: 99%  96% 98%  Weight:  112.5 kg    Height:        Intake/Output Summary (Last 24 hours) at 07/18/2024 1034 Last data filed at 07/18/2024 0958 Gross per 24 hour  Intake 1203 ml  Output 2100 ml  Net -897  ml   Filed Weights   07/17/24 0508 07/17/24 0953 07/18/24 0418  Weight: 111.8 kg 112 kg 112.5 kg    Examination:  General exam: Appears calm and comfortable  Respiratory system: Clear to auscultation. Respiratory effort normal.  Room air Cardiovascular system: S1 & S2 heard, RRR.  Gastrointestinal system: Abdomen is soft Central nervous system: Alert and awake Extremities: 2+ pitting  edema bilateral lower extremities Skin: No significant lesions noted Psychiatry: Flat affect.    Data Reviewed: I have personally reviewed following labs and imaging studies  CBC: Recent Labs  Lab 07/16/24 1053  WBC 7.4  NEUTROABS 4.7  HGB 13.6  HCT 42.7  MCV 88.0  PLT 204   Basic Metabolic Panel: Recent Labs  Lab 07/11/24 1115 07/16/24 1053 07/17/24 0419 07/18/24 0427  NA 140 139 142 142  K 4.0 3.1* 3.4* 4.1  CL 97 96* 99 100  CO2 29 30 29  32  GLUCOSE 124* 128* 93 135*  BUN 25 31* 30* 31*  CREATININE 2.34* 2.40* 2.29* 2.24*  CALCIUM  8.8 8.9 8.8* 9.0  MG  --  2.5* 2.4 2.5*   GFR: Estimated Creatinine Clearance: 32.2 mL/min (A) (by C-G formula based on SCr of 2.24 mg/dL (H)). Liver Function Tests: Recent Labs  Lab 07/16/24 1053  AST 26  ALT 10  ALKPHOS 89  BILITOT 1.3*  PROT 6.7  ALBUMIN  4.0   No results for input(s): LIPASE, AMYLASE in the last 168 hours. No results for input(s): AMMONIA in the last 168 hours. Coagulation Profile: No results for input(s): INR, PROTIME in the last 168 hours. Cardiac Enzymes: No results for input(s): CKTOTAL, CKMB, CKMBINDEX, TROPONINI in the last 168 hours. BNP (last 3 results) Recent Labs    07/16/24 1053  PROBNP 4,561.0*   HbA1C: No results for input(s): HGBA1C in the last 72 hours. CBG: Recent Labs  Lab 07/17/24 0734 07/17/24 1212 07/17/24 1723 07/17/24 2112 07/18/24 0723  GLUCAP 86 164* 256* 184* 158*   Lipid Profile: No results for input(s): CHOL, HDL, LDLCALC, TRIG, CHOLHDL, LDLDIRECT in the last 72 hours. Thyroid Function Tests: No results for input(s): TSH, T4TOTAL, FREET4, T3FREE, THYROIDAB in the last 72 hours. Anemia Panel: No results for input(s): VITAMINB12, FOLATE, FERRITIN, TIBC, IRON, RETICCTPCT in the last 72 hours. Sepsis Labs: No results for input(s): PROCALCITON, LATICACIDVEN in the last 168 hours.  No results found for this  or any previous visit (from the past 240 hours).       Radiology Studies: ECHOCARDIOGRAM COMPLETE Result Date: 07/17/2024    ECHOCARDIOGRAM REPORT   Patient Name:   Wayne Gordon Date of Exam: 07/17/2024 Medical Rec #:  [id number]      Height:       76.0 in Accession #:    [id number]     Weight:       246.5 lb Date of Birth:  [birth year]       BSA:          2.421 m Patient Age:    89 years       BP:           115/73 mmHg Patient Gender: M              HR:           75 bpm. Exam Location:  [name] Procedure: 2D Echo, Cardiac Doppler, Color Doppler and Intracardiac            Opacification Agent (Both Spectral and Color Flow Doppler  were            utilized during procedure). Indications:    CHF-Acute Systolic I50.21  History:        Patient has prior history of Echocardiogram examinations, most                 recent 03/30/2024. CHF and Cardiomyopathy, CAD, Defibrillator,                 Mitral Valve Disease; Risk Factors:Hypertension and Diabetes.  Sonographer:    Jayson Gaskins Referring Phys: [phone] DAVID TAT IMPRESSIONS  1. Left ventricular ejection fraction, by estimation, is 30 to 35%. The left ventricle has moderately decreased function. The left ventricle demonstrates global hypokinesis. There is moderate left ventricular hypertrophy. Left ventricular diastolic parameters are indeterminate. There is the interventricular septum is flattened in systole and diastole, consistent with right ventricular pressure and volume overload.  2. RV poorly visualized. Grossly appears enlarged with decreased systolic function. Indeterminant PASP, IVC poorly visualized. . Right ventricular systolic function was not well visualized. The right ventricular size is not well visualized.  3. #30 MEMO 3D RING ANNuLOPLASTY ring is present at the MV anulus. . The mitral valve has been repaired/replaced. No evidence of mitral valve regurgitation. No evidence of mitral stenosis. The mean mitral valve gradient is 3.0 mmHg.  4. The  tricuspid valve is abnormal.  5. The aortic valve has an indeterminant number of cusps. There is mild calcification of the aortic valve. There is mild thickening of the aortic valve. Aortic valve regurgitation is not visualized. No aortic stenosis is present. FINDINGS  Left Ventricle: Left ventricular ejection fraction, by estimation, is 30 to 35%. The left ventricle has moderately decreased function. The left ventricle demonstrates global hypokinesis. The left ventricular internal cavity size was normal in size. There is moderate left ventricular hypertrophy. The interventricular septum is flattened in systole and diastole, consistent with right ventricular pressure and volume overload. Left ventricular diastolic parameters are indeterminate. Right Ventricle: RV poorly visualized. Grossly appears enlarged with decreased systolic function. Indeterminant PASP, IVC poorly visualized. The right ventricular size is not well visualized. Right vetricular wall thickness was not well visualized. Right  ventricular systolic function was not well visualized. Left Atrium: Left atrial size was not well visualized. Right Atrium: Right atrial size was not well visualized. Pericardium: There is no evidence of pericardial effusion. Mitral Valve: #30 MEMO 3D RING ANNuLOPLASTY ring is present at the MV anulus. The mitral valve has been repaired/replaced. There is mild thickening of the mitral valve leaflet(s). There is mild calcification of the mitral valve leaflet(s). Mild mitral annular calcification. No evidence of mitral valve regurgitation. There is a MEMO 3D SMD30 prosthetic annuloplasty ring present in the mitral position. No evidence of mitral valve stenosis. MV peak gradient, 6.4 mmHg. The mean mitral valve gradient is 3.0 mmHg. Tricuspid Valve: The tricuspid valve is abnormal. Tricuspid valve regurgitation is mild . No evidence of tricuspid stenosis. Aortic Valve: The aortic valve has an indeterminant number of cusps.  There is mild calcification of the aortic valve. There is mild thickening of the aortic valve. There is mild aortic valve annular calcification. Aortic valve regurgitation is not visualized. No aortic stenosis is present. Aortic valve mean gradient measures 2.0 mmHg. Aortic valve peak gradient measures 3.0 mmHg. Aortic valve area, by VTI measures 2.03 cm. Pulmonic Valve: The pulmonic valve was not well visualized. Pulmonic valve regurgitation is not visualized. No evidence of pulmonic stenosis. Aorta: The aortic root  and ascending aorta are structurally normal, with no evidence of dilitation. Venous: The inferior vena cava was not well visualized. IAS/Shunts: The interatrial septum was not well visualized. Additional Comments: A device lead is visualized in the right atrium and right ventricle.  LEFT VENTRICLE PLAX 2D LVIDd:         5.70 cm LVIDs:         4.80 cm LV PW:         1.25 cm LV IVS:        1.30 cm LVOT diam:     1.80 cm LV SV:         29 LV SV Index:   12 LVOT Area:     2.54 cm  LEFT ATRIUM             Index        RIGHT ATRIUM           Index LA Vol (A2C):   66.8 ml 27.59 ml/m  RA Area:     22.40 cm LA Vol (A4C):   52.0 ml 21.48 ml/m  RA Volume:   66.00 ml  27.26 ml/m LA Biplane Vol: 58.7 ml 24.25 ml/m  AORTIC VALVE AV Area (Vmax):    1.90 cm AV Area (Vmean):   1.81 cm AV Area (VTI):     2.03 cm AV Vmax:           86.60 cm/s AV Vmean:          64.700 cm/s AV VTI:            0.143 m AV Peak Grad:      3.0 mmHg AV Mean Grad:      2.0 mmHg LVOT Vmax:         64.70 cm/s LVOT Vmean:        46.000 cm/s LVOT VTI:          0.114 m LVOT/AV VTI ratio: 0.80  AORTA Ao Root diam: 3.60 cm Ao Asc diam:  3.50 cm MITRAL VALVE                TRICUSPID VALVE MV Area (PHT): 2.60 cm     TR Peak grad:   22.5 mmHg MV Area VTI:   1.09 cm     TR Vmax:        237.00 cm/s MV Peak grad:  6.4 mmHg MV Mean grad:  3.0 mmHg     SHUNTS MV Vmax:       1.26 m/s     Systemic VTI:  0.11 m MV Vmean:      74.9 cm/s    Systemic  Diam: 1.80 cm MV Decel Time: 292 msec MV E velocity: 115.00 cm/s Dorn Ross MD Electronically signed by Dorn Ross MD Signature Date/Time: 07/17/2024/12:38:23 PM    Final    US  Venous Img Lower Unilateral Right Result Date: 07/16/2024 CLINICAL DATA:  Right lower extremity edema. EXAM: RIGHT LOWER EXTREMITY VENOUS DOPPLER ULTRASOUND TECHNIQUE: Gray-scale sonography with graded compression, as well as color Doppler and duplex ultrasound were performed to evaluate the lower extremity deep venous systems from the level of the common femoral vein and including the common femoral, femoral, profunda femoral, popliteal and calf veins including the posterior tibial, peroneal and gastrocnemius veins when visible. The superficial great saphenous vein was also interrogated. Spectral Doppler was utilized to evaluate flow at rest and with distal augmentation maneuvers in the common femoral, femoral and popliteal veins. COMPARISON:  None Available. FINDINGS: Contralateral  Common Femoral Vein: Respiratory phasicity is normal and symmetric with the symptomatic side. No evidence of thrombus. Normal compressibility. Common Femoral Vein: No evidence of thrombus. Normal compressibility, respiratory phasicity and response to augmentation. Saphenofemoral Junction: No evidence of thrombus. Normal compressibility and flow on color Doppler imaging. Profunda Femoral Vein: No evidence of thrombus. Normal compressibility and flow on color Doppler imaging. Femoral Vein: No evidence of thrombus. Normal compressibility, respiratory phasicity and response to augmentation. Popliteal Vein: No evidence of thrombus. Normal compressibility, respiratory phasicity and response to augmentation. Calf Veins: No evidence of thrombus. Normal compressibility and flow on color Doppler imaging. Superficial Great Saphenous Vein: No evidence of thrombus. Normal compressibility. Venous Reflux:  None. Other Findings: No evidence of superficial  thrombophlebitis or abnormal fluid collection. IMPRESSION: No evidence of right lower extremity deep venous thrombosis. Electronically Signed   By: Marcey Moan M.D.   On: 07/16/2024 11:46   DG Chest 2 View Result Date: 07/16/2024 EXAM: 2 VIEW(S) XRAY OF THE CHEST 07/16/2024 10:36:00 AM COMPARISON: 04/19/2024 CLINICAL HISTORY: SOB, CP FINDINGS: LINES, TUBES AND DEVICES: Left-sided defibrillator is unchanged. LUNGS AND PLEURA: Central pulmonary vascular congestion. Elevated right hemidiaphragm. Minimal bibasilar subsegmental atelectasis. No pleural effusion. No pneumothorax. HEART AND MEDIASTINUM: Stable cardiomegaly. Left-sided defibrillator is unchanged. Status post coronary artery bypass graft and cardiac valve repair. BONES AND SOFT TISSUES: No acute osseous abnormality. IMPRESSION: 1. Stable cardiomegaly and central pulmonary vascular congestion. 2. Elevated right hemidiaphragm. 3. Minimal bibasilar subsegmental atelectasis. Electronically signed by: Lynwood Seip MD 07/16/2024 10:55 AM EST RP Workstation: [id number]        Scheduled Meds:  apixaban   2.5 mg Oral BID   carvedilol   12.5 mg Oral BID WC   furosemide   80 mg Intravenous Q8H   gabapentin   300 mg Oral QHS   insulin  aspart  0-15 Units Subcutaneous TID WC   insulin  aspart  0-5 Units Subcutaneous QHS   insulin  glargine-yfgn  10 Units Subcutaneous QHS   melatonin  6 mg Oral QHS   pantoprazole   40 mg Oral Daily   rosuvastatin   5 mg Oral Daily   sodium chloride  flush  3 mL Intravenous Q12H     LOS: 2 days    Time spent: 55 minutes    Gwyneth Fernandez JONETTA Fairly, DO [facility]  If 7PM-7AM, please contact night-coverage [email or web address] 07/18/2024, 10:34 AM

## 2024-07-19 DIAGNOSIS — I5023 Acute on chronic systolic (congestive) heart failure: Secondary | ICD-10-CM | POA: Diagnosis not present

## 2024-07-19 LAB — BASIC METABOLIC PANEL WITH GFR
Anion gap: 9 (ref 5–15)
BUN: 29 mg/dL — ABNORMAL HIGH (ref 8–23)
CO2: 32 mmol/L (ref 22–32)
Calcium: 8.8 mg/dL — ABNORMAL LOW (ref 8.9–10.3)
Chloride: 97 mmol/L — ABNORMAL LOW (ref 98–111)
Creatinine, Ser: 2.17 mg/dL — ABNORMAL HIGH (ref 0.61–1.24)
GFR, Estimated: 29 mL/min — ABNORMAL LOW (ref >60)
Glucose, Bld: 115 mg/dL — ABNORMAL HIGH (ref 70–99)
Potassium: 3.9 mmol/L (ref 3.5–5.1)
Sodium: 138 mmol/L (ref 135–145)

## 2024-07-19 LAB — GLUCOSE, CAPILLARY
Glucose-Capillary: 118 mg/dL — ABNORMAL HIGH (ref 70–99)
Glucose-Capillary: 216 mg/dL — ABNORMAL HIGH (ref 70–99)
Glucose-Capillary: 210 mg/dL — ABNORMAL HIGH (ref 70–99)
Glucose-Capillary: 157 mg/dL — ABNORMAL HIGH (ref 70–99)

## 2024-07-19 LAB — MAGNESIUM: Magnesium: 2.4 mg/dL (ref 1.7–2.4)

## 2024-07-19 MED ORDER — FUROSEMIDE 10 MG/ML IJ SOLN
8.0000 mg/h | INTRAVENOUS | Status: DC
  Administered 2024-07-19 – 2024-07-22 (×5): 12 mg/h via INTRAVENOUS
  Administered 2024-07-23: 8 mg/h via INTRAVENOUS
  Filled 2024-07-19 (×8): qty 20

## 2024-07-19 NOTE — Progress Notes (Signed)
 Mobility Specialist Progress Note:    07/19/24 0925  Mobility  Activity Ambulated with assistance  Level of Assistance Standby assist, set-up cues, supervision of patient - no hands on  Assistive Device Front wheel walker  Distance Ambulated (ft) 130 ft  Range of Motion/Exercises Active;All extremities  Activity Response Tolerated well  Mobility Referral Yes  Mobility visit 1 Mobility  Mobility Specialist Start Time (ACUTE ONLY) [id number]  Mobility Specialist Stop Time (ACUTE ONLY) 0945  Mobility Specialist Time Calculation (min) (ACUTE ONLY) 20 min   Pt received in chair, agreeable to mobility. Required SBA to stand and ambulate with RW. Tolerated well, mentioned BLE weakness during ambulation. Returned to chair, all needs met.  Kevontay Burks Mobility Specialist Please contact via Special Educational Needs Teacher or  Rehab office at [phone]

## 2024-07-19 NOTE — Plan of Care (Signed)

## 2024-07-19 NOTE — Progress Notes (Signed)
 PROGRESS NOTE    Wayne Gordon  FMW:[id number] DOB: 1934-06-13 DOA: 07/16/2024 PCP: Renato Dorothey HERO, NP   Brief Narrative:    Wayne Gordon is a 89 year old male with HFrEF, CAD status post CABG and mitral valve repair 2013, LVEF 20-25%, status post ICD, pacer placement, stage IV CKD, persistent atrial fibrillation/atrial flutter on chronic anticoagulation with apixaban  status post DCCV 04/01/24, type 2 diabetes mellitus on insulin , CKD 4 presenting with dyspnea on exertion for about a week.  The patient was at his cardiology office visit today.  He was noted to be fluid overloaded and has gained 18 pounds over the past month.  His office weight was 254 pounds. Patient endorses compliance with his medications.  Notably, the patient had recently had his torsemide  increased from 20 mg daily to 40 mg daily for about a week.  He was admitted with acute on chronic HFrEF and is still undergoing diuresis, now with Lasix  drip per cardiology.  Assessment & Plan:   Principal Problem:   Acute on chronic HFrEF (heart failure with reduced ejection fraction) (HCC) Active Problems:   S/P MVR (mitral valve repair)   History of implantable cardioverter-defibrillator (ICD) placement   CKD (chronic kidney disease) stage 4, GFR 15-29 ml/min (HCC)  Assessment and Plan:   Acute on chronic HFrEF - 03/30/2024 echo EF 20-25%, global HK, mild decreased RVF, s/p MV annuloplasty -06/2024 echo with LVEF 30-35% with indeterminate diastolic function and D-shaped septum - Continue now on IV Lasix  drip per cardiology recommendation and consider use of metolazone  as needed - Accurate I's and O's-- -overall weights have been stable since admission   Persistent atrial fibrillation - Status post DCCV 04/01/2024 - Continue carvedilol  - Continue apixaban  - Previously on amiodarone , but discontinued secondary to dizziness and generalized weakness - remains rate controlled   chronic CKD stage IV - Baseline creatinine  2.2-2.5 - monitor BMP with diuresis - Secondary to cardiorenal syndrome   Acute respiratory failure with hypoxia-resolved - Continue to monitor   Coronary artery disease /elevated troponin - Status post CABG -Secondary to demand ischemia - No angina - continue crestor  - Currently on apixaban    Diabetes mellitus type 2, uncontrolled with hyperglycemia - 03/29/2024 hemoglobin A1c 7.7 - Continue NovoLog  sliding scale - Continue Semglee  reduced dose - He is on Toujeo  26 units daily at home   Mixed hyperlipidemia - continue crestor    Sinus node dysfunction s/p ICD 2013 with upgrade of Biotronik BiV ICD 09/2020 - Device interrogation 02/2024: AP/VP rhythm with no treated arrhythmias - Continue to follow with EP  Obesity, class I - BMI 30   DVT prophylaxis: Apixaban  Code Status: DNR Family Communication: None at bedside Disposition Plan:  Status is: Inpatient Remains inpatient appropriate because: Need for IV medications  Consultants:  Cardiology  Procedures:  None  Antimicrobials:  None   Subjective: Patient seen and evaluated today with no new acute complaints or concerns. No acute concerns or events noted overnight.  Objective: Vitals:   07/18/24 1919 07/19/24 0418 07/19/24 0422 07/19/24 0813  BP: 110/74 103/68  100/61  Pulse: 92 (!) 44  79  Resp: 18 18  17   Temp: 97.7 F (36.5 C) 97.6 F (36.4 C)    TempSrc: Oral Oral    SpO2: 98% 95%  94%  Weight:   112.2 kg   Height:        Intake/Output Summary (Last 24 hours) at 07/19/2024 1219 Last data filed at 07/19/2024 0734 Gross per 24 hour  Intake 480 ml  Output 2250 ml  Net -1770 ml   Filed Weights   07/17/24 0953 07/18/24 0418 07/19/24 0422  Weight: 112 kg 112.5 kg 112.2 kg    Examination:  General exam: Appears calm and comfortable  Respiratory system: Clear to auscultation. Respiratory effort normal.  Room air Cardiovascular system: S1 & S2 heard, RRR.  Gastrointestinal system: Abdomen is  soft Central nervous system: Alert and awake Extremities: 2+ pitting edema bilateral lower extremities Skin: No significant lesions noted Psychiatry: Flat affect.    Data Reviewed: I have personally reviewed following labs and imaging studies  CBC: Recent Labs  Lab 07/16/24 1053  WBC 7.4  NEUTROABS 4.7  HGB 13.6  HCT 42.7  MCV 88.0  PLT 204   Basic Metabolic Panel: Recent Labs  Lab 07/16/24 1053 07/17/24 0419 07/18/24 0427 07/19/24 0403  NA 139 142 142 138  K 3.1* 3.4* 4.1 3.9  CL 96* 99 100 97*  CO2 30 29 32 32  GLUCOSE 128* 93 135* 115*  BUN 31* 30* 31* 29*  CREATININE 2.40* 2.29* 2.24* 2.17*  CALCIUM  8.9 8.8* 9.0 8.8*  MG 2.5* 2.4 2.5* 2.4   GFR: Estimated Creatinine Clearance: 33.2 mL/min (A) (by C-G formula based on SCr of 2.17 mg/dL (H)). Liver Function Tests: Recent Labs  Lab 07/16/24 1053  AST 26  ALT 10  ALKPHOS 89  BILITOT 1.3*  PROT 6.7  ALBUMIN  4.0   No results for input(s): LIPASE, AMYLASE in the last 168 hours. No results for input(s): AMMONIA in the last 168 hours. Coagulation Profile: No results for input(s): INR, PROTIME in the last 168 hours. Cardiac Enzymes: No results for input(s): CKTOTAL, CKMB, CKMBINDEX, TROPONINI in the last 168 hours. BNP (last 3 results) Recent Labs    07/16/24 1053  PROBNP 4,561.0*   HbA1C: No results for input(s): HGBA1C in the last 72 hours. CBG: Recent Labs  Lab 07/18/24 1212 07/18/24 1702 07/18/24 1921 07/19/24 0730 07/19/24 1155  GLUCAP 183* 143* 192* 118* 216*   Lipid Profile: No results for input(s): CHOL, HDL, LDLCALC, TRIG, CHOLHDL, LDLDIRECT in the last 72 hours. Thyroid Function Tests: No results for input(s): TSH, T4TOTAL, FREET4, T3FREE, THYROIDAB in the last 72 hours. Anemia Panel: No results for input(s): VITAMINB12, FOLATE, FERRITIN, TIBC, IRON, RETICCTPCT in the last 72 hours. Sepsis Labs: No results for input(s):  PROCALCITON, LATICACIDVEN in the last 168 hours.  No results found for this or any previous visit (from the past 240 hours).       Radiology Studies: No results found.       Scheduled Meds:  apixaban   2.5 mg Oral BID   carvedilol   12.5 mg Oral BID WC   gabapentin   300 mg Oral QHS   insulin  aspart  0-15 Units Subcutaneous TID WC   insulin  aspart  0-5 Units Subcutaneous QHS   insulin  glargine-yfgn  10 Units Subcutaneous QHS   melatonin  6 mg Oral QHS   pantoprazole   40 mg Oral Daily   rosuvastatin   5 mg Oral Daily   sodium chloride  flush  3 mL Intravenous Q12H     LOS: 3 days    Time spent: 55 minutes    Rashaunda Rahl JONETTA Fairly, DO [facility]  If 7PM-7AM, please contact night-coverage [email or web address] 07/19/2024, 12:19 PM

## 2024-07-19 NOTE — Progress Notes (Signed)
 Rounding Note   Patient Name: Wayne Gordon Date of Encounter: 07/19/2024  [facility] Cardiologist: Diannah SHAUNNA Maywood, MD   Subjective No complaints  Scheduled Meds:  apixaban   2.5 mg Oral BID   carvedilol   12.5 mg Oral BID WC   furosemide   80 mg Intravenous Q8H   gabapentin   300 mg Oral QHS   insulin  aspart  0-15 Units Subcutaneous TID WC   insulin  aspart  0-5 Units Subcutaneous QHS   insulin  glargine-yfgn  10 Units Subcutaneous QHS   melatonin  6 mg Oral QHS   pantoprazole   40 mg Oral Daily   rosuvastatin   5 mg Oral Daily   sodium chloride  flush  3 mL Intravenous Q12H   Continuous Infusions:  PRN Meds: acetaminophen , ondansetron  (ZOFRAN ) IV, sodium chloride  flush   Vital Signs  Vitals:   07/18/24 1919 07/19/24 0418 07/19/24 0422 07/19/24 0813  BP: 110/74 103/68  100/61  Pulse: 92 (!) 44  79  Resp: 18 18  17   Temp: 97.7 F (36.5 C) 97.6 F (36.4 C)    TempSrc: Oral Oral    SpO2: 98% 95%  94%  Weight:   112.2 kg   Height:        Intake/Output Summary (Last 24 hours) at 07/19/2024 0835 Last data filed at 07/19/2024 0734 Gross per 24 hour  Intake 1200 ml  Output 3200 ml  Net -2000 ml      07/19/2024    4:22 AM 07/18/2024    4:18 AM 07/17/2024    9:53 AM  Last 3 Weights  Weight (lbs) 247 lb 5.7 oz 248 lb 246 lb 14.6 oz  Weight (kg) 112.2 kg 112.492 kg 112 kg      Telemetry Rate controlled afib - Personally Reviewed  ECG  N/a - Personally Reviewed  Physical Exam  GEN: No acute distress.   Neck: No JVD Cardiac: irreg  Respiratory: decreased breath sounds bilateral bases GI: Soft, nontender, non-distended  MS: 2-3+ bilateral LE edema Neuro:  Nonfocal  Psych: Normal affect   Labs High Sensitivity Troponin:  No results for input(s): TROPONINIHS in the last 720 hours.   Chemistry Recent Labs  Lab 07/16/24 1053 07/17/24 0419 07/18/24 0427 07/19/24 0403  NA 139 142 142 138  K 3.1* 3.4* 4.1 3.9  CL 96* 99 100 97*  CO2 30  29 32 32  GLUCOSE 128* 93 135* 115*  BUN 31* 30* 31* 29*  CREATININE 2.40* 2.29* 2.24* 2.17*  CALCIUM  8.9 8.8* 9.0 8.8*  MG 2.5* 2.4 2.5* 2.4  PROT 6.7  --   --   --   ALBUMIN  4.0  --   --   --   AST 26  --   --   --   ALT 10  --   --   --   ALKPHOS 89  --   --   --   BILITOT 1.3*  --   --   --   GFRNONAA 25* 27* 28* 29*  ANIONGAP 13 13 10 9     Lipids No results for input(s): CHOL, TRIG, HDL, LABVLDL, LDLCALC, CHOLHDL in the last 168 hours.  Hematology Recent Labs  Lab 07/16/24 1053  WBC 7.4  RBC 4.85  HGB 13.6  HCT 42.7  MCV 88.0  MCH 28.0  MCHC 31.9  RDW 16.3*  PLT 204   Thyroid No results for input(s): TSH, FREET4 in the last 168 hours.  BNP Recent Labs  Lab 07/16/24 1053  PROBNP 4,561.0*  DDimer No results for input(s): DDIMER in the last 168 hours.   Radiology  No results found.  Cardiac Studies   Patient Profile   Wayne Gordon is a 89 y.o. male with a hx of CAD s/p CABG x 3 with MVR 2013, ICM with baseline EF 20-25%, VT, sinus node dysfunction s/p ICD 2013 with upgrade of Biotronik BiV ICD 09/2020, persistent atrial fibrillation/flutter s/p successful DCCV 04/01/2024, HTN, HLD, DM II, stage III CKD, Carotid Artery disease, vertigo chronic HFrEF (EF 20 to 25% in 03/2024), who is being seen 07/17/2024 for the evaluation of acute HF at the request of Dr. Maree.   Assessment & Plan   1.Acute on chronic HFrEF - 03/2024 echo: LVEF 20-25%, indet diastolic, D shaped septum, mild RV dysfunction. Normal MV repair 06/2024 echo: LVE 30-35%, indet diastolic fxn, D shaped septum. RV poorly visualized, grossly appears enlarged with systolic/dysfunction. Normal MV repair.  - CXR central pulm congestion, proBNP 4561   - on IV lasix  80mg  tid, Incomplete I/Os. Clinic weight 258 however same day hospital weight 245 lbs. Overall stable weights since admission. Downtrend in Cr with diuresis consistent with venous congestion and HF.  - has not significantly  diuresed. Will transition to lasix  drip at 12mg /hr, can titrate over the weekend. Goal would be net negative 2-3L per day. Could consider oral metolazone  2.5mg  once daily over the weekend if not titrating with lasix  drip titration.    - medical therapy limited by CKD with GFR 27, history of angioedema on ACEi. Avoid ACE/ARB/ARNI/MRA/SGLTi - he is on coreg  12.5mg  bid. BP's soft at times, would not inititate hydral/nitrates   2.Afib - continue coreg , eliquis      3. CAD with prior CABG - no acute issues    For questions or updates, please contact [facility] Please consult [email or web address] for contact info under       Signed, Alvan Carrier, MD  07/19/2024, 8:35 AM

## 2024-07-19 NOTE — Progress Notes (Signed)
 Heart Failure Navigator Progress Note  Assessed for Heart & Vascular TOC clinic readiness. Attempted to call patient in his room 308 @ [facility]. No answer at this time.  Navigator provided patient with Heart Failure Education on 04/18/24 while at [facility].  04/01/24 had been transferred to [facility] and declined a HF TOC at that time.  He was readmitted back to [facility] and was scheduled  a HF TOC on 04/25/24 & 04/30/2024 in which both were canceled. Will reach out to his nurse @ [facility] to try to communicate with patient.  Charmaine Pines, RN, BSN [facility] Heart Failure Navigator Secure Chat Only

## 2024-07-19 NOTE — Care Management Important Message (Signed)
 Important Message  Patient Details  Name: Wayne Gordon MRN: [id number] Date of Birth: 09-27-1933   Important Message Given:  Yes - Medicare IM     Wayne Gordon 07/19/2024, 12:07 PM

## 2024-07-20 DIAGNOSIS — I5023 Acute on chronic systolic (congestive) heart failure: Secondary | ICD-10-CM | POA: Diagnosis not present

## 2024-07-20 LAB — BASIC METABOLIC PANEL WITH GFR
Anion gap: 10 (ref 5–15)
BUN: 30 mg/dL — ABNORMAL HIGH (ref 8–23)
CO2: 33 mmol/L — ABNORMAL HIGH (ref 22–32)
Calcium: 8.7 mg/dL — ABNORMAL LOW (ref 8.9–10.3)
Chloride: 97 mmol/L — ABNORMAL LOW (ref 98–111)
Creatinine, Ser: 1.89 mg/dL — ABNORMAL HIGH (ref 0.61–1.24)
GFR, Estimated: 34 mL/min — ABNORMAL LOW (ref >60)
Glucose, Bld: 151 mg/dL — ABNORMAL HIGH (ref 70–99)
Potassium: 3.4 mmol/L — ABNORMAL LOW (ref 3.5–5.1)
Sodium: 139 mmol/L (ref 135–145)

## 2024-07-20 LAB — GLUCOSE, CAPILLARY
Glucose-Capillary: 155 mg/dL — ABNORMAL HIGH (ref 70–99)
Glucose-Capillary: 199 mg/dL — ABNORMAL HIGH (ref 70–99)
Glucose-Capillary: 145 mg/dL — ABNORMAL HIGH (ref 70–99)
Glucose-Capillary: 176 mg/dL — ABNORMAL HIGH (ref 70–99)

## 2024-07-20 LAB — MAGNESIUM: Magnesium: 2.4 mg/dL (ref 1.7–2.4)

## 2024-07-20 MED ORDER — POTASSIUM CHLORIDE CRYS ER 20 MEQ PO TBCR
40.0000 meq | EXTENDED_RELEASE_TABLET | Freq: Two times a day (BID) | ORAL | Status: AC
  Administered 2024-07-20 (×2): 40 meq via ORAL
  Filled 2024-07-20 (×2): qty 4

## 2024-07-20 MED ORDER — SENNOSIDES-DOCUSATE SODIUM 8.6-50 MG PO TABS
1.0000 | ORAL_TABLET | Freq: Two times a day (BID) | ORAL | Status: DC
  Administered 2024-07-20 – 2024-07-23 (×7): 1 via ORAL
  Filled 2024-07-20 (×7): qty 1

## 2024-07-20 MED ORDER — METOLAZONE 5 MG PO TABS
2.5000 mg | ORAL_TABLET | Freq: Once | ORAL | Status: AC
  Administered 2024-07-20: 2.5 mg via ORAL
  Filled 2024-07-20: qty 1

## 2024-07-20 NOTE — Progress Notes (Signed)
 PROGRESS NOTE    Wayne Gordon  FMW:[id number] DOB: 1934-03-21 DOA: 07/16/2024 PCP: Renato Dorothey HERO, NP   Brief Narrative:    Wayne Gordon is a 89 year old male with HFrEF, CAD status post CABG and mitral valve repair 2013, LVEF 20-25%, status post ICD, pacer placement, stage IV CKD, persistent atrial fibrillation/atrial flutter on chronic anticoagulation with apixaban  status post DCCV 04/01/24, type 2 diabetes mellitus on insulin , CKD 4 presenting with dyspnea on exertion for about a week.  The patient was at his cardiology office visit today.  He was noted to be fluid overloaded and has gained 18 pounds over the past month.  His office weight was 254 pounds. Patient endorses compliance with his medications.  Notably, the patient had recently had his torsemide  increased from 20 mg daily to 40 mg daily for about a week.  He was admitted with acute on chronic HFrEF and is still undergoing diuresis, now with Lasix  drip per cardiology, but still without adequate diuresis and therefore will require some metolazone .  Assessment & Plan:   Principal Problem:   Acute on chronic HFrEF (heart failure with reduced ejection fraction) (HCC) Active Problems:   S/P MVR (mitral valve repair)   History of implantable cardioverter-defibrillator (ICD) placement   CKD (chronic kidney disease) stage 4, GFR 15-29 ml/min (HCC)  Assessment and Plan:   Acute on chronic HFrEF - 03/30/2024 echo EF 20-25%, global HK, mild decreased RVF, s/p MV annuloplasty -06/2024 echo with LVEF 30-35% with indeterminate diastolic function and D-shaped septum - Continue now on IV Lasix  drip per cardiology recommendation and will plan to give a dose of metolazone  today due to ongoing inadequate diuresis. - Accurate I's and O's-- -overall weights have been stable since admission   Persistent atrial fibrillation - Status post DCCV 04/01/2024 - Continue carvedilol  - Continue apixaban  - Previously on amiodarone , but discontinued  secondary to dizziness and generalized weakness - remains rate controlled   chronic CKD stage IV - Baseline creatinine 2.2-2.5 -Creatinine downward trending with diuresis - monitor BMP with diuresis - Secondary to cardiorenal syndrome   Acute respiratory failure with hypoxia-resolved - Continue to monitor   Coronary artery disease /elevated troponin - Status post CABG -Secondary to demand ischemia - No angina - continue crestor  - Currently on apixaban    Diabetes mellitus type 2, uncontrolled with hyperglycemia - 03/29/2024 hemoglobin A1c 7.7 - Continue NovoLog  sliding scale - Continue Semglee  reduced dose - He is on Toujeo  26 units daily at home   Mixed hyperlipidemia - continue crestor    Sinus node dysfunction s/p ICD 2013 with upgrade of Biotronik BiV ICD 09/2020 - Device interrogation 02/2024: AP/VP rhythm with no treated arrhythmias - Continue to follow with EP  Obesity, class I - BMI 30   DVT prophylaxis: Apixaban  Code Status: DNR Family Communication: None at bedside Disposition Plan:  Status is: Inpatient Remains inpatient appropriate because: Need for IV medications  Consultants:  Cardiology Palliative for goals of care discussion.  Procedures:  None  Antimicrobials:  None   Subjective: Patient seen and evaluated today with no new acute complaints or concerns. No acute concerns or events noted overnight.  He is still not adequately diuresing and worried about ongoing swelling.  Objective: Vitals:   07/20/24 0334 07/20/24 0336 07/20/24 0620 07/20/24 0908  BP: (!) 102/59   108/70  Pulse: 85   87  Resp: 15     Temp: 97.9 F (36.6 C)     TempSrc: Oral     SpO2:  96%     Weight:  111.5 kg 111.2 kg   Height:        Intake/Output Summary (Last 24 hours) at 07/20/2024 1045 Last data filed at 07/20/2024 0704 Gross per 24 hour  Intake 797.52 ml  Output 1800 ml  Net -1002.48 ml   Filed Weights   07/19/24 0422 07/20/24 0336 07/20/24 0620  Weight:  112.2 kg 111.5 kg 111.2 kg    Examination:  General exam: Appears calm and comfortable  Respiratory system: Clear to auscultation. Respiratory effort normal.  Room air Cardiovascular system: S1 & S2 heard, RRR.  Gastrointestinal system: Abdomen is soft Central nervous system: Alert and awake Extremities: 2+ pitting edema bilateral lower extremities Skin: No significant lesions noted Psychiatry: Flat affect.    Data Reviewed: I have personally reviewed following labs and imaging studies  CBC: Recent Labs  Lab 07/16/24 1053  WBC 7.4  NEUTROABS 4.7  HGB 13.6  HCT 42.7  MCV 88.0  PLT 204   Basic Metabolic Panel: Recent Labs  Lab 07/16/24 1053 07/17/24 0419 07/18/24 0427 07/19/24 0403 07/20/24 0327  NA 139 142 142 138 139  K 3.1* 3.4* 4.1 3.9 3.4*  CL 96* 99 100 97* 97*  CO2 30 29 32 32 33*  GLUCOSE 128* 93 135* 115* 151*  BUN 31* 30* 31* 29* 30*  CREATININE 2.40* 2.29* 2.24* 2.17* 1.89*  CALCIUM  8.9 8.8* 9.0 8.8* 8.7*  MG 2.5* 2.4 2.5* 2.4 2.4   GFR: Estimated Creatinine Clearance: 37.9 mL/min (A) (by C-G formula based on SCr of 1.89 mg/dL (H)). Liver Function Tests: Recent Labs  Lab 07/16/24 1053  AST 26  ALT 10  ALKPHOS 89  BILITOT 1.3*  PROT 6.7  ALBUMIN  4.0   No results for input(s): LIPASE, AMYLASE in the last 168 hours. No results for input(s): AMMONIA in the last 168 hours. Coagulation Profile: No results for input(s): INR, PROTIME in the last 168 hours. Cardiac Enzymes: No results for input(s): CKTOTAL, CKMB, CKMBINDEX, TROPONINI in the last 168 hours. BNP (last 3 results) Recent Labs    07/16/24 1053  PROBNP 4,561.0*   HbA1C: No results for input(s): HGBA1C in the last 72 hours. CBG: Recent Labs  Lab 07/19/24 0730 07/19/24 1155 07/19/24 1607 07/19/24 1952 07/20/24 0731  GLUCAP 118* 216* 210* 157* 155*   Lipid Profile: No results for input(s): CHOL, HDL, LDLCALC, TRIG, CHOLHDL, LDLDIRECT in the  last 72 hours. Thyroid Function Tests: No results for input(s): TSH, T4TOTAL, FREET4, T3FREE, THYROIDAB in the last 72 hours. Anemia Panel: No results for input(s): VITAMINB12, FOLATE, FERRITIN, TIBC, IRON, RETICCTPCT in the last 72 hours. Sepsis Labs: No results for input(s): PROCALCITON, LATICACIDVEN in the last 168 hours.  No results found for this or any previous visit (from the past 240 hours).       Radiology Studies: No results found.       Scheduled Meds:  apixaban   2.5 mg Oral BID   carvedilol   12.5 mg Oral BID WC   gabapentin   300 mg Oral QHS   insulin  aspart  0-15 Units Subcutaneous TID WC   insulin  aspart  0-5 Units Subcutaneous QHS   insulin  glargine-yfgn  10 Units Subcutaneous QHS   melatonin  6 mg Oral QHS   metolazone   2.5 mg Oral Once   pantoprazole   40 mg Oral Daily   potassium chloride   40 mEq Oral BID   rosuvastatin   5 mg Oral Daily   senna-docusate  1 tablet Oral BID  sodium chloride  flush  3 mL Intravenous Q12H     LOS: 4 days    Time spent: 55 minutes    Bakary Bramer JONETTA Fairly, DO [facility]  If 7PM-7AM, please contact night-coverage [email or web address] 07/20/2024, 10:45 AM

## 2024-07-20 NOTE — Progress Notes (Signed)
 Has been sitting in recliner today. CCMD called to report 8 second run of wide QRS complexes.  Dr. Maree made aware.  Vitals stable.  Family at bedside.

## 2024-07-20 NOTE — Plan of Care (Signed)

## 2024-07-21 DIAGNOSIS — I5023 Acute on chronic systolic (congestive) heart failure: Secondary | ICD-10-CM | POA: Diagnosis not present

## 2024-07-21 LAB — GLUCOSE, CAPILLARY
Glucose-Capillary: 137 mg/dL — ABNORMAL HIGH (ref 70–99)
Glucose-Capillary: 198 mg/dL — ABNORMAL HIGH (ref 70–99)
Glucose-Capillary: 269 mg/dL — ABNORMAL HIGH (ref 70–99)
Glucose-Capillary: 205 mg/dL — ABNORMAL HIGH (ref 70–99)

## 2024-07-21 LAB — BASIC METABOLIC PANEL WITH GFR
Anion gap: 10 (ref 5–15)
BUN: 33 mg/dL — ABNORMAL HIGH (ref 8–23)
CO2: 35 mmol/L — ABNORMAL HIGH (ref 22–32)
Calcium: 9.2 mg/dL (ref 8.9–10.3)
Chloride: 94 mmol/L — ABNORMAL LOW (ref 98–111)
Creatinine, Ser: 2.11 mg/dL — ABNORMAL HIGH (ref 0.61–1.24)
GFR, Estimated: 30 mL/min — ABNORMAL LOW (ref >60)
Glucose, Bld: 136 mg/dL — ABNORMAL HIGH (ref 70–99)
Potassium: 3.9 mmol/L (ref 3.5–5.1)
Sodium: 139 mmol/L (ref 135–145)

## 2024-07-21 LAB — MAGNESIUM: Magnesium: 2.4 mg/dL (ref 1.7–2.4)

## 2024-07-21 MED ORDER — POTASSIUM CHLORIDE CRYS ER 20 MEQ PO TBCR
40.0000 meq | EXTENDED_RELEASE_TABLET | Freq: Once | ORAL | Status: AC
  Administered 2024-07-21: 40 meq via ORAL
  Filled 2024-07-21: qty 2

## 2024-07-21 MED ORDER — METOLAZONE 5 MG PO TABS
2.5000 mg | ORAL_TABLET | Freq: Once | ORAL | Status: AC
  Administered 2024-07-21: 2.5 mg via ORAL
  Filled 2024-07-21: qty 1

## 2024-07-21 NOTE — Progress Notes (Signed)
 PROGRESS NOTE    Wayne Gordon  FMW:[id number] DOB: 1934-02-12 DOA: 07/16/2024 PCP: Renato Dorothey HERO, NP   Brief Narrative:    Wayne Gordon is a 89 year old male with HFrEF, CAD status post CABG and mitral valve repair 2013, LVEF 20-25%, status post ICD, pacer placement, stage IV CKD, persistent atrial fibrillation/atrial flutter on chronic anticoagulation with apixaban  status post DCCV 04/01/24, type 2 diabetes mellitus on insulin , CKD 4 presenting with dyspnea on exertion for about a week.  The patient was at his cardiology office visit today.  He was noted to be fluid overloaded and has gained 18 pounds over the past month.  His office weight was 254 pounds. Patient endorses compliance with his medications.  Notably, the patient had recently had his torsemide  increased from 20 mg daily to 40 mg daily for about a week.  He was admitted with acute on chronic HFrEF and is still undergoing diuresis, now with Lasix  drip per cardiology, but still without adequate diuresis and therefore will require some more metolazone  on 11/23.  Assessment & Plan:   Principal Problem:   Acute on chronic HFrEF (heart failure with reduced ejection fraction) (HCC) Active Problems:   S/P MVR (mitral valve repair)   History of implantable cardioverter-defibrillator (ICD) placement   CKD (chronic kidney disease) stage 4, GFR 15-29 ml/min (HCC)  Assessment and Plan:   Acute on chronic HFrEF - 03/30/2024 echo EF 20-25%, global HK, mild decreased RVF, s/p MV annuloplasty -06/2024 echo with LVEF 30-35% with indeterminate diastolic function and D-shaped septum - Continue now on IV Lasix  drip per cardiology recommendation and will plan to give another dose of metolazone  today due to better response on this and ongoing volume overload - Accurate I's and O's-- -overall weights have been stable since admission   Persistent atrial fibrillation - Status post DCCV 04/01/2024 - Continue carvedilol  - Continue apixaban  -  Previously on amiodarone , but discontinued secondary to dizziness and generalized weakness - remains rate controlled   chronic CKD stage IV-stable - Baseline creatinine 2.2-2.5 -Creatinine downward trending with diuresis - monitor BMP with diuresis - Secondary to cardiorenal syndrome   Acute respiratory failure with hypoxia-resolved - Continue to monitor   Coronary artery disease /elevated troponin - Status post CABG -Secondary to demand ischemia - No angina - continue crestor  - Currently on apixaban    Diabetes mellitus type 2, uncontrolled with hyperglycemia - 03/29/2024 hemoglobin A1c 7.7 - Continue NovoLog  sliding scale - Continue Semglee  reduced dose - He is on Toujeo  26 units daily at home   Mixed hyperlipidemia - continue crestor    Sinus node dysfunction s/p ICD 2013 with upgrade of Biotronik BiV ICD 09/2020 - Device interrogation 02/2024: AP/VP rhythm with no treated arrhythmias - Continue to follow with EP  Obesity, class I - BMI 30   DVT prophylaxis: Apixaban  Code Status: DNR Family Communication: None at bedside Disposition Plan:  Status is: Inpatient Remains inpatient appropriate because: Need for IV medications  Consultants:  Cardiology Palliative for goals of care discussion on 11/24 as patient is currently active with palliative care  Procedures:  None  Antimicrobials:  None   Subjective: Patient seen and evaluated today with no new acute complaints or concerns. No acute concerns or events noted overnight.  He has diuresed more aggressively overnight and appears to be down 5 pounds per documented weight compared to yesterday.  Urine output appears to be inaccurately recorded.  Objective: Vitals:   07/20/24 2044 07/21/24 0417 07/21/24 0518 07/21/24 0845  BP:  105/70 105/70  101/62  Pulse: 76 76  82  Resp: 18 17    Temp: 97.9 F (36.6 C) (!) 97.5 F (36.4 C)    TempSrc: Oral Oral    SpO2: 95% 93%    Weight:   109.2 kg   Height:         Intake/Output Summary (Last 24 hours) at 07/21/2024 1028 Last data filed at 07/21/2024 0826 Gross per 24 hour  Intake 1591.82 ml  Output 3550 ml  Net -1958.18 ml   Filed Weights   07/20/24 0336 07/20/24 0620 07/21/24 0518  Weight: 111.5 kg 111.2 kg 109.2 kg    Examination:  General exam: Appears calm and comfortable  Respiratory system: Clear to auscultation. Respiratory effort normal.  Nasal cannula Cardiovascular system: S1 & S2 heard, RRR.  Gastrointestinal system: Abdomen is soft Central nervous system: Alert and awake Extremities: 2+ pitting edema bilateral lower extremities Skin: No significant lesions noted Psychiatry: Flat affect.    Data Reviewed: I have personally reviewed following labs and imaging studies  CBC: Recent Labs  Lab 07/16/24 1053  WBC 7.4  NEUTROABS 4.7  HGB 13.6  HCT 42.7  MCV 88.0  PLT 204   Basic Metabolic Panel: Recent Labs  Lab 07/17/24 0419 07/18/24 0427 07/19/24 0403 07/20/24 0327 07/21/24 0435  NA 142 142 138 139 139  K 3.4* 4.1 3.9 3.4* 3.9  CL 99 100 97* 97* 94*  CO2 29 32 32 33* 35*  GLUCOSE 93 135* 115* 151* 136*  BUN 30* 31* 29* 30* 33*  CREATININE 2.29* 2.24* 2.17* 1.89* 2.11*  CALCIUM  8.8* 9.0 8.8* 8.7* 9.2  MG 2.4 2.5* 2.4 2.4 2.4   GFR: Estimated Creatinine Clearance: 31.3 mL/min (A) (by C-G formula based on SCr of 2.11 mg/dL (H)). Liver Function Tests: Recent Labs  Lab 07/16/24 1053  AST 26  ALT 10  ALKPHOS 89  BILITOT 1.3*  PROT 6.7  ALBUMIN  4.0   No results for input(s): LIPASE, AMYLASE in the last 168 hours. No results for input(s): AMMONIA in the last 168 hours. Coagulation Profile: No results for input(s): INR, PROTIME in the last 168 hours. Cardiac Enzymes: No results for input(s): CKTOTAL, CKMB, CKMBINDEX, TROPONINI in the last 168 hours. BNP (last 3 results) Recent Labs    07/16/24 1053  PROBNP 4,561.0*   HbA1C: No results for input(s): HGBA1C in the last 72  hours. CBG: Recent Labs  Lab 07/20/24 0731 07/20/24 1137 07/20/24 1545 07/20/24 1959 07/21/24 0724  GLUCAP 155* 199* 145* 176* 137*   Lipid Profile: No results for input(s): CHOL, HDL, LDLCALC, TRIG, CHOLHDL, LDLDIRECT in the last 72 hours. Thyroid Function Tests: No results for input(s): TSH, T4TOTAL, FREET4, T3FREE, THYROIDAB in the last 72 hours. Anemia Panel: No results for input(s): VITAMINB12, FOLATE, FERRITIN, TIBC, IRON, RETICCTPCT in the last 72 hours. Sepsis Labs: No results for input(s): PROCALCITON, LATICACIDVEN in the last 168 hours.  No results found for this or any previous visit (from the past 240 hours).       Radiology Studies: No results found.       Scheduled Meds:  apixaban   2.5 mg Oral BID   carvedilol   12.5 mg Oral BID WC   gabapentin   300 mg Oral QHS   insulin  aspart  0-15 Units Subcutaneous TID WC   insulin  aspart  0-5 Units Subcutaneous QHS   insulin  glargine-yfgn  10 Units Subcutaneous QHS   melatonin  6 mg Oral QHS   metolazone   2.5 mg  Oral Once   pantoprazole   40 mg Oral Daily   rosuvastatin   5 mg Oral Daily   senna-docusate  1 tablet Oral BID   sodium chloride  flush  3 mL Intravenous Q12H     LOS: 5 days    Time spent: 55 minutes    Willy Vorce JONETTA Fairly, DO [facility]  If 7PM-7AM, please contact night-coverage [email or web address] 07/21/2024, 10:28 AM

## 2024-07-21 NOTE — Plan of Care (Signed)

## 2024-07-22 DIAGNOSIS — Z515 Encounter for palliative care: Secondary | ICD-10-CM | POA: Diagnosis not present

## 2024-07-22 DIAGNOSIS — Z7189 Other specified counseling: Secondary | ICD-10-CM | POA: Diagnosis not present

## 2024-07-22 DIAGNOSIS — I493 Ventricular premature depolarization: Secondary | ICD-10-CM | POA: Diagnosis not present

## 2024-07-22 DIAGNOSIS — I5023 Acute on chronic systolic (congestive) heart failure: Secondary | ICD-10-CM | POA: Diagnosis not present

## 2024-07-22 DIAGNOSIS — I5043 Acute on chronic combined systolic (congestive) and diastolic (congestive) heart failure: Secondary | ICD-10-CM | POA: Diagnosis not present

## 2024-07-22 DIAGNOSIS — I4729 Other ventricular tachycardia: Secondary | ICD-10-CM | POA: Diagnosis not present

## 2024-07-22 DIAGNOSIS — I4819 Other persistent atrial fibrillation: Secondary | ICD-10-CM

## 2024-07-22 DIAGNOSIS — Z95810 Presence of automatic (implantable) cardiac defibrillator: Secondary | ICD-10-CM | POA: Diagnosis not present

## 2024-07-22 LAB — BASIC METABOLIC PANEL WITH GFR
Anion gap: 9 (ref 5–15)
BUN: 35 mg/dL — ABNORMAL HIGH (ref 8–23)
CO2: 37 mmol/L — ABNORMAL HIGH (ref 22–32)
Calcium: 9.3 mg/dL (ref 8.9–10.3)
Chloride: 92 mmol/L — ABNORMAL LOW (ref 98–111)
Creatinine, Ser: 2.19 mg/dL — ABNORMAL HIGH (ref 0.61–1.24)
GFR, Estimated: 28 mL/min — ABNORMAL LOW (ref >60)
Glucose, Bld: 162 mg/dL — ABNORMAL HIGH (ref 70–99)
Potassium: 3.7 mmol/L (ref 3.5–5.1)
Sodium: 138 mmol/L (ref 135–145)

## 2024-07-22 LAB — MAGNESIUM: Magnesium: 2.3 mg/dL (ref 1.7–2.4)

## 2024-07-22 LAB — GLUCOSE, CAPILLARY
Glucose-Capillary: 160 mg/dL — ABNORMAL HIGH (ref 70–99)
Glucose-Capillary: 202 mg/dL — ABNORMAL HIGH (ref 70–99)
Glucose-Capillary: 219 mg/dL — ABNORMAL HIGH (ref 70–99)
Glucose-Capillary: 149 mg/dL — ABNORMAL HIGH (ref 70–99)

## 2024-07-22 NOTE — Progress Notes (Signed)
 Heart Failure Nurse Navigator Progress Note  PCP: Renato Dorothey HERO, NP PCP-Cardiologist: Diannah MYRTIS Maywood, MD Admission Diagnosis: Acute on chronic congestive heart failure, unspecified heart failure type [facility]) Admitted from: Outpatient cardiology office visit via POV for failure of outpatient diuretic.  [facility] Patient: Patient reached remotely via telephone in room 308.  2 patient identifiers confirmed prior to providing him with CHF education and offering him a HF TOC appointment  Presentation:   Wayne Gordon is a 89 y.o. male.  He presented with bilateral lower extremity swelling, weakness and shortness of breath with exertion. 18 pound weight gain. Patient denied chest pain. History includes CHF, CAD, HTN, Atrial Flutter, DM, CKD, GERD, HLD, Anemia.  Patient is on Eliquis .  HS-Troponin 94.  Pro BNP 4,561. Chest x-ray-Cardiomegaly with central vascular congestion.   ECHO/ LVEF: 30-35%  Clinical Course:  Past Medical History:  Diagnosis Date   Allergy to ACE inhibitors    Angioedema many years ago; patient has tolerated Losartan  (ARB) in the past - it was held during admisson for GI bleed and worsening renal function >> resume Losartan  25 mg QD 06/2015   Arthritis    Atrial fibrillation (HCC)    PAF, CHADs2Vasc = 5   Carotid artery disease    60-79% LICA   CHF (congestive heart failure) (HCC)    CKD (chronic kidney disease)    Coronary artery disease    Diabetes mellitus    GERD (gastroesophageal reflux disease)    Hyperlipidemia    Hypertension    ICD (implantable cardiac defibrillator) in place 03/28/2012   Biotronik, Dr. Waddell 03/28/12   Ischemic cardiomyopathy    S/P CABG x 3; EF 25%   Mitral regurgitation    S/P mitral valve repair 2013   Myocardial infarction [facility])    Renal insufficiency      Social History   Socioeconomic History   Marital status: Married    Spouse name: Not on file   Number of children: Not on file   Years of education:  Not on file   Highest education level: Some college, no degree  Occupational History   Occupation: Retired  Tobacco Use   Smoking status: Never   Smokeless tobacco: Never  Vaping Use   Vaping status: Never Used  Substance and Sexual Activity   Alcohol use: No    Alcohol/week: 0.0 standard drinks of alcohol   Drug use: No   Sexual activity: Yes  Other Topics Concern   Not on file  Social History Narrative   Not on file   Social Drivers of Health   Financial Resource Strain: Medium Risk (06/30/2024)   Received from [facility]   Overall Financial Resource Strain (CARDIA)    How hard is it for you to pay for the very basics like food, housing, medical care, and heating?: Somewhat hard  Food Insecurity: Patient Declined (07/16/2024)   Hunger Vital Sign    Worried About Running Out of Food in the Last Year: Patient declined    Ran Out of Food in the Last Year: Patient declined  Transportation Needs: Patient Declined (07/16/2024)   PRAPARE - Administrator, Civil Service (Medical): Patient declined    Lack of Transportation (Non-Medical): Patient declined  Physical Activity: Inactive (06/30/2024)   Received from [facility]   Exercise Vital Sign    On average, how many days per week do you engage in moderate to strenuous exercise (like a brisk walk)?: 0 days  Minutes of Exercise per Session: Not on file  Stress: No Stress Concern Present (06/30/2024)   Received from [facility] of Occupational Health - Occupational Stress Questionnaire    Do you feel stress - tense, restless, nervous, or anxious, or unable to sleep at night because your mind is troubled all the time - these days?: Only a little  Social Connections: Patient Declined (07/16/2024)   Social Connection and Isolation Panel    Frequency of Communication with Friends and Family: Patient declined    Frequency of Social Gatherings with Friends and Family: Patient declined    Attends  Religious Services: Patient declined    Database Administrator or Organizations: Patient declined    Attends Banker Meetings: Patient declined    Marital Status: Patient declined  Education Assessment and Provision:  Detailed education and instructions provided on heart failure disease management including the following:  Signs and symptoms of Heart Failure When to call the physician Importance of daily weights Low sodium diet Fluid restriction Medication management Anticipated future follow-up appointments  Patient education given on each of the above topics.  Patient acknowledges understanding via teach back method and acceptance of all instructions.  Education Materials:  Living Better With Heart Failure Booklet, HF zone tool, & Daily Weight Tracker Tool.  Patient has scale at home: Yes Patient has pill box at home: Yes    High Risk Criteria for Readmission and/or Poor Patient Outcomes: Heart failure hospital admissions (last 6 months): 2  No Show rate: 3% Difficult social situation: None Demonstrates medication adherence: Yes Primary Language: English Literacy level: Reading, Writing, Comprehension  Barriers of Care:   Patient has missed previously scheduled HF appointments. Agreeable to attend this next one scheduled.  Considerations/Referrals:  Referral made to Heart Failure Pharmacist Stewardship: Yes Referral made to Heart Failure CSW/NCM TOC: No Referral made to Heart & Vascular TOC clinic: Yes.  [facility] 07/30/24 @ 11:00  Items for Follow-up on DC/TOC: Daily Weights Diet & Fluid Restrictions Continued Heart Failure Education  Charmaine Pines, RN, BSN [facility] Heart Failure Navigator Secure Chat Only

## 2024-07-22 NOTE — Progress Notes (Signed)
 PROGRESS NOTE    Wayne Gordon  FMW:[id number] DOB: December 20, 1933 DOA: 07/16/2024 PCP: Renato Dorothey HERO, NP   Brief Narrative:    Wayne Gordon is a 89 year old male with HFrEF, CAD status post CABG and mitral valve repair 2013, LVEF 20-25%, status post ICD, pacer placement, stage IV CKD, persistent atrial fibrillation/atrial flutter on chronic anticoagulation with apixaban  status post DCCV 04/01/24, type 2 diabetes mellitus on insulin , CKD 4 presenting with dyspnea on exertion for about a week.  The patient was at his cardiology office visit today.  He was noted to be fluid overloaded and has gained 18 pounds over the past month.  His office weight was 254 pounds. Patient endorses compliance with his medications.  Notably, the patient had recently had his torsemide  increased from 20 mg daily to 40 mg daily for about a week.  He was admitted with acute on chronic HFrEF and is still undergoing diuresis, now with Lasix  drip per cardiology which is being further adjusted for improved diuresis.  Assessment & Plan:   Principal Problem:   Acute on chronic HFrEF (heart failure with reduced ejection fraction) (HCC) Active Problems:   S/P MVR (mitral valve repair)   History of implantable cardioverter-defibrillator (ICD) placement   CKD (chronic kidney disease) stage 4, GFR 15-29 ml/min (HCC)  Assessment and Plan:   Acute on chronic HFrEF - 03/30/2024 echo EF 20-25%, global HK, mild decreased RVF, s/p MV annuloplasty -06/2024 echo with LVEF 30-35% with indeterminate diastolic function and D-shaped septum - Continue now on IV Lasix  drip per cardiology recommendation and will plan to give another dose of metolazone  today due to better response on this and ongoing volume overload - Accurate I's and O's-- -overall weights have been stable since admission   Persistent atrial fibrillation - Status post DCCV 04/01/2024 - Continue carvedilol  - Continue apixaban  - Previously on amiodarone , but  discontinued secondary to dizziness and generalized weakness - remains rate controlled   chronic CKD stage IV-stable - Baseline creatinine 2.2-2.5 -Creatinine downward trending with diuresis - monitor BMP with diuresis - Secondary to cardiorenal syndrome   Acute respiratory failure with hypoxia-resolved - Continue to monitor   Coronary artery disease /elevated troponin - Status post CABG -Secondary to demand ischemia - No angina - continue crestor  - Currently on apixaban    Diabetes mellitus type 2, uncontrolled with hyperglycemia - 03/29/2024 hemoglobin A1c 7.7 - Continue NovoLog  sliding scale - Continue Semglee  reduced dose - He is on Toujeo  26 units daily at home   Mixed hyperlipidemia - continue crestor    Sinus node dysfunction s/p ICD 2013 with upgrade of Biotronik BiV ICD 09/2020 - Device interrogation 02/2024: AP/VP rhythm with no treated arrhythmias - Continue to follow with EP  Obesity, class I - BMI 30   DVT prophylaxis: Apixaban  Code Status: DNR Family Communication: None at bedside Disposition Plan:  Status is: Inpatient Remains inpatient appropriate because: Need for IV medications  Consultants:  Cardiology Palliative for goals of care discussion on 11/24 as patient is currently active with palliative care  Procedures:  None  Antimicrobials:  None   Subjective: Patient seen and evaluated today with no new acute complaints or concerns. No acute concerns or events noted overnight.  He continues to aggressively diurese and is eager for discharge soon.  Objective: Vitals:   07/21/24 1957 07/22/24 0459 07/22/24 0500 07/22/24 0505  BP: 107/65 111/71    Pulse: 82 78  89  Resp:  18    Temp: 97.8 F (36.6 C)  98 F (36.7 C)    TempSrc: Oral Oral    SpO2: 98% 100%    Weight:   107.4 kg   Height:        Intake/Output Summary (Last 24 hours) at 07/22/2024 1105 Last data filed at 07/22/2024 0900 Gross per 24 hour  Intake --  Output 2150 ml  Net  -2150 ml   Filed Weights   07/20/24 0620 07/21/24 0518 07/22/24 0500  Weight: 111.2 kg 109.2 kg 107.4 kg    Examination:  General exam: Appears calm and comfortable  Respiratory system: Clear to auscultation. Respiratory effort normal.  Nasal cannula Cardiovascular system: S1 & S2 heard, RRR.  Gastrointestinal system: Abdomen is soft Central nervous system: Alert and awake Extremities: 2+ pitting edema bilateral lower extremities Skin: No significant lesions noted Psychiatry: Flat affect.    Data Reviewed: I have personally reviewed following labs and imaging studies  CBC: Recent Labs  Lab 07/16/24 1053  WBC 7.4  NEUTROABS 4.7  HGB 13.6  HCT 42.7  MCV 88.0  PLT 204   Basic Metabolic Panel: Recent Labs  Lab 07/18/24 0427 07/19/24 0403 07/20/24 0327 07/21/24 0435 07/22/24 0453  NA 142 138 139 139 138  K 4.1 3.9 3.4* 3.9 3.7  CL 100 97* 97* 94* 92*  CO2 32 32 33* 35* 37*  GLUCOSE 135* 115* 151* 136* 162*  BUN 31* 29* 30* 33* 35*  CREATININE 2.24* 2.17* 1.89* 2.11* 2.19*  CALCIUM  9.0 8.8* 8.7* 9.2 9.3  MG 2.5* 2.4 2.4 2.4 2.3   GFR: Estimated Creatinine Clearance: 30.1 mL/min (A) (by C-G formula based on SCr of 2.19 mg/dL (H)). Liver Function Tests: Recent Labs  Lab 07/16/24 1053  AST 26  ALT 10  ALKPHOS 89  BILITOT 1.3*  PROT 6.7  ALBUMIN  4.0   No results for input(s): LIPASE, AMYLASE in the last 168 hours. No results for input(s): AMMONIA in the last 168 hours. Coagulation Profile: No results for input(s): INR, PROTIME in the last 168 hours. Cardiac Enzymes: No results for input(s): CKTOTAL, CKMB, CKMBINDEX, TROPONINI in the last 168 hours. BNP (last 3 results) Recent Labs    07/16/24 1053  PROBNP 4,561.0*   HbA1C: No results for input(s): HGBA1C in the last 72 hours. CBG: Recent Labs  Lab 07/21/24 1109 07/21/24 1610 07/21/24 1955 07/22/24 0727 07/22/24 1058  GLUCAP 198* 269* 205* 160* 202*   Lipid Profile: No  results for input(s): CHOL, HDL, LDLCALC, TRIG, CHOLHDL, LDLDIRECT in the last 72 hours. Thyroid Function Tests: No results for input(s): TSH, T4TOTAL, FREET4, T3FREE, THYROIDAB in the last 72 hours. Anemia Panel: No results for input(s): VITAMINB12, FOLATE, FERRITIN, TIBC, IRON, RETICCTPCT in the last 72 hours. Sepsis Labs: No results for input(s): PROCALCITON, LATICACIDVEN in the last 168 hours.  No results found for this or any previous visit (from the past 240 hours).       Radiology Studies: No results found.       Scheduled Meds:  apixaban   2.5 mg Oral BID   carvedilol   12.5 mg Oral BID WC   gabapentin   300 mg Oral QHS   insulin  aspart  0-15 Units Subcutaneous TID WC   insulin  aspart  0-5 Units Subcutaneous QHS   insulin  glargine-yfgn  10 Units Subcutaneous QHS   melatonin  6 mg Oral QHS   pantoprazole   40 mg Oral Daily   rosuvastatin   5 mg Oral Daily   senna-docusate  1 tablet Oral BID   sodium chloride  flush  3 mL Intravenous Q12H     LOS: 6 days    Time spent: 55 minutes    Daianna Vasques JONETTA Fairly, DO [facility]  If 7PM-7AM, please contact night-coverage [email or web address] 07/22/2024, 11:05 AM

## 2024-07-22 NOTE — Progress Notes (Signed)
 Progress Note  Patient Name: Wayne Gordon Date of Encounter: 07/22/2024  Primary Cardiologist: Niguel Moure P Jordann Grime, MD  Subjective   No events.  1.9 L urine output in the last 24 hours on Lasix  drip 12 mg/h.  Inpatient Medications    Scheduled Meds:  apixaban   2.5 mg Oral BID   carvedilol   12.5 mg Oral BID WC   gabapentin   300 mg Oral QHS   insulin  aspart  0-15 Units Subcutaneous TID WC   insulin  aspart  0-5 Units Subcutaneous QHS   insulin  glargine-yfgn  10 Units Subcutaneous QHS   melatonin  6 mg Oral QHS   pantoprazole   40 mg Oral Daily   rosuvastatin   5 mg Oral Daily   senna-docusate  1 tablet Oral BID   sodium chloride  flush  3 mL Intravenous Q12H   Continuous Infusions:  furosemide  (LASIX ) 200 mg in dextrose  5 % 100 mL (2 mg/mL) infusion 12 mg/hr (07/22/24 0903)   PRN Meds: acetaminophen , ondansetron  (ZOFRAN ) IV, sodium chloride  flush   Vital Signs    Vitals:   07/21/24 1957 07/22/24 0459 07/22/24 0500 07/22/24 0505  BP: 107/65 111/71    Pulse: 82 78  89  Resp:  18    Temp: 97.8 F (36.6 C) 98 F (36.7 C)    TempSrc: Oral Oral    SpO2: 98% 100%    Weight:   107.4 kg   Height:        Intake/Output Summary (Last 24 hours) at 07/22/2024 0956 Last data filed at 07/22/2024 0900 Gross per 24 hour  Intake --  Output 2150 ml  Net -2150 ml   Filed Weights   07/20/24 0620 07/21/24 0518 07/22/24 0500  Weight: 111.2 kg 109.2 kg 107.4 kg    Telemetry     Personally reviewed.  Frequent PVCs/NSVT.  ECG    Not performed today.  Physical Exam   GEN: No acute distress.   Neck: JVD unable to examine due to body habitus Cardiac: RRR, no murmur, rub, or gallop.  Respiratory: Nonlabored. Clear to auscultation bilaterally. GI: Soft, nontender, bowel sounds present. MS: 1+ pitting edema on the dorsum of the foot but otherwise no swelling; No deformity. Neuro:  Nonfocal. Psych: Alert and oriented x 3. Normal affect.  Labs    Chemistry Recent Labs   Lab 07/16/24 1053 07/17/24 0419 07/20/24 0327 07/21/24 0435 07/22/24 0453  NA 139   < > 139 139 138  K 3.1*   < > 3.4* 3.9 3.7  CL 96*   < > 97* 94* 92*  CO2 30   < > 33* 35* 37*  GLUCOSE 128*   < > 151* 136* 162*  BUN 31*   < > 30* 33* 35*  CREATININE 2.40*   < > 1.89* 2.11* 2.19*  CALCIUM  8.9   < > 8.7* 9.2 9.3  PROT 6.7  --   --   --   --   ALBUMIN  4.0  --   --   --   --   AST 26  --   --   --   --   ALT 10  --   --   --   --   ALKPHOS 89  --   --   --   --   BILITOT 1.3*  --   --   --   --   GFRNONAA 25*   < > 34* 30* 28*  ANIONGAP 13   < > 10 10 9    < > =  values in this interval not displayed.     Hematology Recent Labs  Lab 07/16/24 1053  WBC 7.4  RBC 4.85  HGB 13.6  HCT 42.7  MCV 88.0  MCH 28.0  MCHC 31.9  RDW 16.3*  PLT 204    Cardiac EnzymesNo results for input(s): TROPONINIHS in the last 720 hours.  BNP Recent Labs  Lab 07/16/24 1053  PROBNP 4,561.0*     DDimerNo results for input(s): DDIMER in the last 168 hours.   Radiology    No results found.  Assessment & Plan   Acute on chronic systolic and diastolic heart failure Biventricular heart failure - Sent from cardiology office due to worsening DOE, bilateral leg swelling x 2 to 3 weeks with 18 pound weight gain.  Compliant with diet/fluid restrictions.  Despite escalation of outpatient p.o. diuretics, his symptoms/weight did not improve.  He is currently admitted to the hospitalist team for the management of acute on chronic systolic and diastolic heart failure.  Echocardiogram is admission showed LVEF 30 to 35% with global hypokinesis, RV pressure/volume overload, RV is poorly visualized but grossly appears enlarged with decreased systolic function. S/p mitral annuloplasty ring with mean mitral valve gradient 3 mmHg. - He weighed 254 pounds in the cardiology office, 245 pounds on the same day in the hospital and 236 pounds today.  Dry weight 235 pounds.  DOE and leg swelling completely  resolved.  He still has some pitting edema on the dorsum of his feet.  Currently on Lasix  drip at 12 mg/h.  Decrease Lasix  drip to 8 mg/h.  Anticipate switching to p.o. torsemide  80 mg daily starting tomorrow (please note home dose was 20 mg that was later increased to 40 mg).   Keep K>4 and <5, Mg>2 and <3. - Continue carvedilol  12.5 mg twice daily. - Not a candidate for ACE/ARB/MRA due to CKD. - Not a candidate for advanced heart failure therapies due to advanced age.  Frequent PVCs/NSVT - I reviewed the device interrogation report from 05/30/2024.  He had 3% PVC burden.  Continue carvedilol  12.5 mg twice daily.  Persistent atrial fibrillation s/p DCCV in August 25 - Continue carvedilol  12.5 mg twice daily and Eliquis  2.5 mg twice daily.  S/p mitral annuloplasty ring: Echo showed mitral mean gradient 3 mmHg.  No MR.  S/p CRT-D: Follows with electrophysiology outpatient.   50 spent in reviewing prior medical records, imaging/reports, more than 3 labs, discussion and documentation.    Signed, Diannah SHAUNNA Maywood, MD  07/22/2024, 9:56 AM

## 2024-07-22 NOTE — Consult Note (Signed)
 Consultation Note Date: 07/22/2024   Patient Name: Wayne Gordon  DOB: 01/20/1934  MRN: [id number]  Age / Sex: 89 y.o., male  PCP: Renato Dorothey HERO, NP Referring Physician: Maree Adron BIRCH, DO  Reason for Consultation: Establishing goals of care  HPI/Patient Profile: 89 y.o. male  with past medical history of HFrEF EF 20-25%, CAD s/p CABG and MVR repair 2013, s/p Biotronik AICD, pacer placement, CKD stage IV, persistent atrial fibrillation/atrial flutter on chronic anticoagulation with apixaban  s/p DCCV, T2DM on insulin  admitted on 07/16/2024 with acute on chronic HFrEF and cardiorenal syndrome requiring Lasix  infusion.   Clinical Assessment and Goals of Care: Consult received and chart review completed. I met today with Mr. Hellmann and no family at bedside. He is sitting up in recliner with legs elevated. He has good understanding of his heart and kidney failure and how these interact. We discussed in more detail how the heart and kidneys work together and makes diuresis difficult or slow to remove fluid. We discussed the window to remove fluid with medications but also not overly stress the kidneys - unfortunately this is a moving window and a good balance one week doesn't always work the same the next week. Mr. Micheletti expresses understanding. We discussed the difficult cycle of heart failure and re-hospitalizations. He remains hopeful for diuresis and return home and that we can find a better balance to keep him out of the hospital longer. He acknowledges the importance of maintaining kidney function and mentions he is hoping he won't need dialysis. I did share that dialysis is very tough on the heart and he would be a poor candidate for dialysis if it came to that. He verbalizes understanding.   We spent much time discussing AICD. He shares how this has brought him back a couple times. We discussed that as  the heart becomes more weak and the body more frail or even with worsening quality of life that sometimes we consider the risks vs benefits of AICD shock. Mr. Gulino understands why a decision may be made to deactivate and will keep this in mind as his health progresses. We also spent time reviewing the option of having assistance from hospice at home and when/why this may be an option. He acknowledges that this has been discussed with him previously. He will keep this in mind but is not interested in hospice at this time.   Mr. Scallon lives with his wife and son in the home. His wife has macular degeneration and his son works during the day but they make this work. He walks with a walker reporting more weakness in the mornings. He has 3 children in total (he had a daughter from his first marriage but she is deceased). His family is very supportive of him. I provided him with my contact information for he and his family if any further questions/concerns.   All questions/concerns addressed. Emotional support provided. Updated Dr. Mallipeddi. Updated Dr. Maree.   Primary Decision Maker PATIENT    SUMMARY OF RECOMMENDATIONS   -  DNR/DNI confirmed - Maintain AICD for now - Not interested in hospice services at this time but understands why this may be a good option in the future if worsening  Code Status/Advance Care Planning: DNR   Symptom Management:  Per attending and cardiology.   Prognosis:  Overall prognosis poor with heart failure and kidney failure.   Discharge Planning: Home with Palliative Services      Primary Diagnoses: Present on Admission:  Acute on chronic HFrEF (heart failure with reduced ejection fraction) (HCC)  History of implantable cardioverter-defibrillator (ICD) placement   I have reviewed the medical record, interviewed the patient and family, and examined the patient. The following aspects are pertinent.  Past Medical History:  Diagnosis Date   Allergy to  ACE inhibitors    Angioedema many years ago; patient has tolerated Losartan  (ARB) in the past - it was held during admisson for GI bleed and worsening renal function >> resume Losartan  25 mg QD 06/2015   Arthritis    Atrial fibrillation (HCC)    PAF, CHADs2Vasc = 5   Carotid artery disease    60-79% LICA   CHF (congestive heart failure) (HCC)    CKD (chronic kidney disease)    Coronary artery disease    Diabetes mellitus    GERD (gastroesophageal reflux disease)    Hyperlipidemia    Hypertension    ICD (implantable cardiac defibrillator) in place 03/28/2012   Biotronik, Dr. Waddell 03/28/12   Ischemic cardiomyopathy    S/P CABG x 3; EF 25%   Mitral regurgitation    S/P mitral valve repair 2013   Myocardial infarction [facility])    Renal insufficiency    Social History   Socioeconomic History   Marital status: Married    Spouse name: Not on file   Number of children: Not on file   Years of education: Not on file   Highest education level: Some college, no degree  Occupational History   Occupation: Retired  Tobacco Use   Smoking status: Never   Smokeless tobacco: Never  Vaping Use   Vaping status: Never Used  Substance and Sexual Activity   Alcohol use: No    Alcohol/week: 0.0 standard drinks of alcohol   Drug use: No   Sexual activity: Yes  Other Topics Concern   Not on file  Social History Narrative   Not on file   Social Drivers of Health   Financial Resource Strain: Medium Risk (06/30/2024)   Received from [facility]   Overall Financial Resource Strain (CARDIA)    How hard is it for you to pay for the very basics like food, housing, medical care, and heating?: Somewhat hard  Food Insecurity: Patient Declined (07/16/2024)   Hunger Vital Sign    Worried About Running Out of Food in the Last Year: Patient declined    Ran Out of Food in the Last Year: Patient declined  Transportation Needs: Patient Declined (07/16/2024)   PRAPARE - Scientist, Research (physical Sciences) (Medical): Patient declined    Lack of Transportation (Non-Medical): Patient declined  Physical Activity: Inactive (06/30/2024)   Received from [facility]   Exercise Vital Sign    On average, how many days per week do you engage in moderate to strenuous exercise (like a brisk walk)?: 0 days    Minutes of Exercise per Session: Not on file  Stress: No Stress Concern Present (06/30/2024)   Received from [facility] - Occupational Stress  Questionnaire    Do you feel stress - tense, restless, nervous, or anxious, or unable to sleep at night because your mind is troubled all the time - these days?: Only a little  Social Connections: Patient Declined (07/16/2024)   Social Connection and Isolation Panel    Frequency of Communication with Friends and Family: Patient declined    Frequency of Social Gatherings with Friends and Family: Patient declined    Attends Religious Services: Patient declined    Database Administrator or Organizations: Patient declined    Attends Engineer, Structural: Patient declined    Marital Status: Patient declined   Family History  Problem Relation Age of Onset   Heart disease Mother    Diabetes Father    Cardiomyopathy Father    Colon cancer Neg Hx    Scheduled Meds:  apixaban   2.5 mg Oral BID   carvedilol   12.5 mg Oral BID WC   gabapentin   300 mg Oral QHS   insulin  aspart  0-15 Units Subcutaneous TID WC   insulin  aspart  0-5 Units Subcutaneous QHS   insulin  glargine-yfgn  10 Units Subcutaneous QHS   melatonin  6 mg Oral QHS   pantoprazole   40 mg Oral Daily   rosuvastatin   5 mg Oral Daily   senna-docusate  1 tablet Oral BID   sodium chloride  flush  3 mL Intravenous Q12H   Continuous Infusions:  furosemide  (LASIX ) 200 mg in dextrose  5 % 100 mL (2 mg/mL) infusion 12 mg/hr (07/21/24 1300)   PRN Meds:.acetaminophen , ondansetron  (ZOFRAN ) IV, sodium chloride  flush Allergies  Allergen  Reactions   Ace Inhibitors Swelling and Other (See Comments)    Kidney issues; angioedema per PMH in chart   Penicillins Palpitations   Review of Systems  Constitutional:  Positive for activity change and fatigue.  Cardiovascular:  Positive for leg swelling.    Physical Exam Vitals and nursing note reviewed.  Constitutional:      General: He is not in acute distress.    Appearance: He is ill-appearing.  Cardiovascular:     Rate and Rhythm: Normal rate.  Pulmonary:     Effort: No tachypnea, accessory muscle usage or respiratory distress.  Abdominal:     Palpations: Abdomen is soft.  Musculoskeletal:     Right lower leg: Edema present.     Left lower leg: Edema present.  Neurological:     Mental Status: He is alert and oriented to person, place, and time.     Vital Signs: BP 111/71 (BP Location: Right Arm)   Pulse 89 Comment: RN Recheck  Temp 98 F (36.7 C) (Oral)   Resp 18   Ht 6' 6 (1.981 m)   Wt 107.4 kg   SpO2 100%   BMI 27.36 kg/m  Pain Scale: 0-10   Pain Score: 4    SpO2: SpO2: 100 % O2 Device:SpO2: 100 % O2 Flow Rate: .O2 Flow Rate (L/min): 2 L/min  IO: Intake/output summary:  Intake/Output Summary (Last 24 hours) at 07/22/2024 0844 Last data filed at 07/22/2024 0743 Gross per 24 hour  Intake --  Output 1800 ml  Net -1800 ml    LBM: Last BM Date : 07/20/24 Baseline Weight: Weight: 111.1 kg Most recent weight: Weight: 107.4 kg     Palliative Assessment/Data:     Time Total: 80 min  Greater than 50%  of this time was spent counseling and coordinating care related to the above assessment and plan.  Signed by: Bernarda Kitty, NP  Palliative Medicine Team Pager # [phone] (M-F 8a-5p) Team Phone # [phone] (Nights/Weekends)

## 2024-07-22 NOTE — Plan of Care (Signed)

## 2024-07-22 NOTE — Progress Notes (Signed)
 Mobility Specialist Progress Note:    07/22/24 0850  Mobility  Activity Ambulated with assistance  Level of Assistance Standby assist, set-up cues, supervision of patient - no hands on  Assistive Device Front wheel walker  Distance Ambulated (ft) 100 ft  Range of Motion/Exercises Active;All extremities  Activity Response Tolerated well  Mobility Referral Yes  Mobility visit 1 Mobility  Mobility Specialist Start Time (ACUTE ONLY) 0850  Mobility Specialist Stop Time (ACUTE ONLY) 0910  Mobility Specialist Time Calculation (min) (ACUTE ONLY) 20 min   Pt received in chair, agreeable to mobility. Required SBA to stand and ambulate with RW. Tolerated well, increased swelling in feet since last week. Returned to chair, all needs met.  Jacinta Penalver Mobility Specialist Please contact via Special Educational Needs Teacher or  Rehab office at [phone]

## 2024-07-23 DIAGNOSIS — Z7189 Other specified counseling: Secondary | ICD-10-CM | POA: Diagnosis not present

## 2024-07-23 DIAGNOSIS — I5023 Acute on chronic systolic (congestive) heart failure: Secondary | ICD-10-CM | POA: Diagnosis not present

## 2024-07-23 DIAGNOSIS — Z515 Encounter for palliative care: Secondary | ICD-10-CM | POA: Diagnosis not present

## 2024-07-23 LAB — MAGNESIUM: Magnesium: 2.1 mg/dL (ref 1.7–2.4)

## 2024-07-23 LAB — BASIC METABOLIC PANEL WITH GFR
Anion gap: 10 (ref 5–15)
BUN: 39 mg/dL — ABNORMAL HIGH (ref 8–23)
CO2: 37 mmol/L — ABNORMAL HIGH (ref 22–32)
Calcium: 9.3 mg/dL (ref 8.9–10.3)
Chloride: 90 mmol/L — ABNORMAL LOW (ref 98–111)
Creatinine, Ser: 2.27 mg/dL — ABNORMAL HIGH (ref 0.61–1.24)
GFR, Estimated: 27 mL/min — ABNORMAL LOW (ref >60)
Glucose, Bld: 140 mg/dL — ABNORMAL HIGH (ref 70–99)
Potassium: 3.1 mmol/L — ABNORMAL LOW (ref 3.5–5.1)
Sodium: 137 mmol/L (ref 135–145)

## 2024-07-23 LAB — GLUCOSE, CAPILLARY
Glucose-Capillary: 146 mg/dL — ABNORMAL HIGH (ref 70–99)
Glucose-Capillary: 166 mg/dL — ABNORMAL HIGH (ref 70–99)

## 2024-07-23 MED ORDER — DAPAGLIFLOZIN PROPANEDIOL 10 MG PO TABS
10.0000 mg | ORAL_TABLET | Freq: Every day | ORAL | Status: DC
  Administered 2024-07-23: 10 mg via ORAL
  Filled 2024-07-23: qty 1

## 2024-07-23 MED ORDER — POTASSIUM CHLORIDE CRYS ER 20 MEQ PO TBCR: 20.0000 meq | EXTENDED_RELEASE_TABLET | Freq: Every day | ORAL | 2 refills | Status: DC

## 2024-07-23 MED ORDER — TORSEMIDE 60 MG PO TABS: 60.0000 mg | ORAL_TABLET | Freq: Every day | ORAL | 2 refills | Status: DC

## 2024-07-23 MED ORDER — POTASSIUM CHLORIDE CRYS ER 20 MEQ PO TBCR
40.0000 meq | EXTENDED_RELEASE_TABLET | Freq: Two times a day (BID) | ORAL | Status: DC
  Administered 2024-07-23: 40 meq via ORAL
  Filled 2024-07-23: qty 2

## 2024-07-23 NOTE — Progress Notes (Signed)
 Mobility Specialist Progress Note:    07/23/24 0945  Mobility  Activity Ambulated with assistance  Level of Assistance Standby assist, set-up cues, supervision of patient - no hands on  Assistive Device Front wheel walker  Distance Ambulated (ft) 100 ft  Range of Motion/Exercises Active;All extremities  Activity Response Tolerated well  Mobility Referral Yes  Mobility visit 1 Mobility  Mobility Specialist Start Time (ACUTE ONLY) 0945  Mobility Specialist Stop Time (ACUTE ONLY) 1005  Mobility Specialist Time Calculation (min) (ACUTE ONLY) 20 min   Pt received in bed, agreeable to mobility. Required SBA to stand and ambulate with RW. Tolerated well, c/o weakness. SpO2 91-92% on RA during ambulation, Returned to chair, all needs met.  Aubrielle Stroud Mobility Specialist Please contact via Special Educational Needs Teacher or  Rehab office at [phone]

## 2024-07-23 NOTE — Progress Notes (Signed)
 Patient had 12 beat run of VTACH overnight. DR Shona notified. Patient sustaining paced 90s

## 2024-07-23 NOTE — Discharge Summary (Signed)
 Physician Discharge Summary  Wayne Gordon FMW:[id number] DOB: 1933/12/29 DOA: 07/16/2024  PCP: Renato Dorothey HERO, NP  Admit date: 07/16/2024  Discharge date: 07/23/2024  Admitted From:Home  Disposition:  Home  Recommendations for Outpatient Follow-up:  Follow up with PCP in 1-2 weeks Follow-up with cardiology as scheduled on 12/2 at 11 AM and 12/19 at 1:30 PM Remain on torsemide  60 mg daily with potassium 20 mill equivalents daily Continue Farxiga  10 mg daily and Coreg  12.5 mg twice daily Continue other home medications as prior  Home Health: Continue to follow-up with palliative care  Equipment/Devices: None  Discharge Condition:Stable  CODE STATUS: DNR  Diet recommendation: Heart Healthy  Brief/Interim Summary: Wayne Gordon is a 89 year old male with HFrEF, CAD status post CABG and mitral valve repair 2013, LVEF 20-25%, status post ICD, pacer placement, stage IV CKD, persistent atrial fibrillation/atrial flutter on chronic anticoagulation with apixaban  status post DCCV 04/01/24, type 2 diabetes mellitus on insulin , CKD 4 presenting with dyspnea on exertion for about a week.  The patient was at his cardiology office visit today.  He was noted to be fluid overloaded and has gained 18 pounds over the past month.  His office weight was 254 pounds. Patient endorses compliance with his medications.  Notably, the patient had recently had his torsemide  increased from 20 mg daily to 40 mg daily for about a week.  He was admitted with acute on chronic HFrEF and required diuresis with IV Lasix  drip under the management of cardiology.  He also required several doses of metolazone  for improved response and still continues to be slightly hypervolemic, but is quite eager for discharge and appears stable enough to do so.  He has been seen by cardiology with recommendations to remain on torsemide  60 mg daily with potassium supplementation and will have close follow-up.  He does follow-up with  palliative care outpatient and has been seen palliative inpatient and is currently not wanting to transition to hospice care.  No other acute events or concerns noted.  Discharge Diagnoses:  Principal Problem:   Acute on chronic HFrEF (heart failure with reduced ejection fraction) (HCC) Active Problems:   Status post mitral valve annuloplasty   Cardiac resynchronization therapy defibrillator (CRT-D) in place   Frequent PVCs   History of implantable cardioverter-defibrillator (ICD) placement   CKD (chronic kidney disease) stage 4, GFR 15-29 ml/min (HCC)   NSVT (nonsustained ventricular tachycardia) (HCC)   Acute on chronic combined systolic and diastolic CHF (congestive heart failure) (HCC)  Principal discharge diagnosis: Acute on chronic HFrEF.  Discharge Instructions  Discharge Instructions     (HEART FAILURE PATIENTS) Call MD:  Anytime you have any of the following symptoms: 1) 3 pound weight gain in 24 hours or 5 pounds in 1 week 2) shortness of breath, with or without a dry hacking cough 3) swelling in the hands, feet or stomach 4) if you have to sleep on extra pillows at night in order to breathe.   Complete by: As directed    Diet - low sodium heart healthy   Complete by: As directed    Increase activity slowly   Complete by: As directed    No wound care   Complete by: As directed       Allergies as of 07/23/2024       Reactions   Ace Inhibitors Swelling, Other (See Comments)   Kidney issues; angioedema per PMH in chart   Penicillins Palpitations        Medication List  TAKE these medications    apixaban  2.5 MG Tabs tablet Commonly known as: Eliquis  Take 1 tablet (2.5 mg total) by mouth 2 (two) times daily.   carvedilol  12.5 MG tablet Commonly known as: COREG  Take 12.5 mg by mouth 2 (two) times daily with a meal.   dapagliflozin  propanediol 10 MG Tabs tablet Commonly known as: FARXIGA  Take 1 tablet (10 mg total) by mouth daily. What changed: when to  take this   gabapentin  100 MG capsule Commonly known as: NEURONTIN  Take 3 capsules (300 mg total) by mouth at bedtime. What changed: how much to take   guaiFENesin  100 MG/5ML liquid Commonly known as: ROBITUSSIN Take 5 mLs by mouth every 4 (four) hours as needed for cough or to loosen phlegm.   HumaLOG  KwikPen 100 UNIT/ML KwikPen Generic drug: insulin  lispro Inject 4-10 Units into the skin 2 (two) times daily. 4 units lunch, 10 units dinner What changed:  how much to take when to take this additional instructions   melatonin 5 MG Tabs Take 5 mg by mouth at bedtime.   omeprazole  40 MG capsule Commonly known as: PRILOSEC Take 1 capsule (40 mg total) by mouth daily before breakfast.   potassium chloride  SA 20 MEQ tablet Commonly known as: KLOR-CON  M Take 1 tablet (20 mEq total) by mouth daily.   rosuvastatin  5 MG tablet Commonly known as: CRESTOR  Take 1 tablet (5 mg total) by mouth daily. What changed: when to take this   Torsemide  60 MG Tabs Take 60 mg by mouth daily. What changed:  medication strength how much to take   Toujeo  SoloStar 300 UNIT/ML Solostar Pen Generic drug: insulin  glargine (1 Unit Dial ) Inject 26 Units into the skin daily. What changed:  when to take this additional instructions        Follow-up Information     [facility]. Go on 07/30/2024.   Specialty: Cardiology Why: Hospital Follow-Up 07/30/2024 @ 11:00  Please bring all medications to follow-up appointment [facility], Entrance C off of [address] , [place], [place]  Free Valet Parking at the door.  Or use Gate Code [phone] to park right under the building and take the elevater right up to clinic. Contact information: [address] [place]  [address] [phone]        Miriam Norris, NP Follow up on 08/16/2024.   Specialty: Cardiology Why: Cardiology Follow-up on 08/16/2024 at  1:30 PM. Contact information: [address] [place] [address] [phone]         Renato Dorothey HERO, NP. Schedule an appointment as soon as possible for a visit in 1 month(s).   Specialty: Internal Medicine Contact information: [address] [place] [address] [phone]                Allergies  Allergen Reactions   Ace Inhibitors Swelling and Other (See Comments)    Kidney issues; angioedema per PMH in chart   Penicillins Palpitations    Consultations: Cardiology Palliative care   Procedures/Studies: ECHOCARDIOGRAM COMPLETE Result Date: 07/17/2024    ECHOCARDIOGRAM REPORT   Patient Name:   Wayne Gordon Date of Exam: 07/17/2024 Medical Rec #:  [id number]      Height:       76.0 in Accession #:    [id number]     Weight:       246.5 lb Date of Birth:  1934/01/27  BSA:          2.421 m Patient Age:    89 years       BP:           115/73 mmHg Patient Gender: M              HR:           75 bpm. Exam Location:  [name] Procedure: 2D Echo, Cardiac Doppler, Color Doppler and Intracardiac            Opacification Agent (Both Spectral and Color Flow Doppler were            utilized during procedure). Indications:    CHF-Acute Systolic I50.21  History:        Patient has prior history of Echocardiogram examinations, most                 recent 03/30/2024. CHF and Cardiomyopathy, CAD, Defibrillator,                 Mitral Valve Disease; Risk Factors:Hypertension and Diabetes.  Sonographer:    Jayson Gaskins Referring Phys: [phone] DAVID TAT IMPRESSIONS  1. Left ventricular ejection fraction, by estimation, is 30 to 35%. The left ventricle has moderately decreased function. The left ventricle demonstrates global hypokinesis. There is moderate left ventricular hypertrophy. Left ventricular diastolic parameters are indeterminate. There is the interventricular septum is flattened in systole and diastole, consistent with right ventricular pressure and volume overload.  2.  RV poorly visualized. Grossly appears enlarged with decreased systolic function. Indeterminant PASP, IVC poorly visualized. . Right ventricular systolic function was not well visualized. The right ventricular size is not well visualized.  3. #30 MEMO 3D RING ANNuLOPLASTY ring is present at the MV anulus. . The mitral valve has been repaired/replaced. No evidence of mitral valve regurgitation. No evidence of mitral stenosis. The mean mitral valve gradient is 3.0 mmHg.  4. The tricuspid valve is abnormal.  5. The aortic valve has an indeterminant number of cusps. There is mild calcification of the aortic valve. There is mild thickening of the aortic valve. Aortic valve regurgitation is not visualized. No aortic stenosis is present. FINDINGS  Left Ventricle: Left ventricular ejection fraction, by estimation, is 30 to 35%. The left ventricle has moderately decreased function. The left ventricle demonstrates global hypokinesis. The left ventricular internal cavity size was normal in size. There is moderate left ventricular hypertrophy. The interventricular septum is flattened in systole and diastole, consistent with right ventricular pressure and volume overload. Left ventricular diastolic parameters are indeterminate. Right Ventricle: RV poorly visualized. Grossly appears enlarged with decreased systolic function. Indeterminant PASP, IVC poorly visualized. The right ventricular size is not well visualized. Right vetricular wall thickness was not well visualized. Right  ventricular systolic function was not well visualized. Left Atrium: Left atrial size was not well visualized. Right Atrium: Right atrial size was not well visualized. Pericardium: There is no evidence of pericardial effusion. Mitral Valve: #30 MEMO 3D RING ANNuLOPLASTY ring is present at the MV anulus. The mitral valve has been repaired/replaced. There is mild thickening of the mitral valve leaflet(s). There is mild calcification of the mitral valve  leaflet(s). Mild mitral annular calcification. No evidence of mitral valve regurgitation. There is a MEMO 3D SMD30 prosthetic annuloplasty ring present in the mitral position. No evidence of mitral valve stenosis. MV peak gradient, 6.4 mmHg. The mean mitral valve gradient is 3.0 mmHg. Tricuspid Valve: The tricuspid valve is abnormal. Tricuspid valve  regurgitation is mild . No evidence of tricuspid stenosis. Aortic Valve: The aortic valve has an indeterminant number of cusps. There is mild calcification of the aortic valve. There is mild thickening of the aortic valve. There is mild aortic valve annular calcification. Aortic valve regurgitation is not visualized. No aortic stenosis is present. Aortic valve mean gradient measures 2.0 mmHg. Aortic valve peak gradient measures 3.0 mmHg. Aortic valve area, by VTI measures 2.03 cm. Pulmonic Valve: The pulmonic valve was not well visualized. Pulmonic valve regurgitation is not visualized. No evidence of pulmonic stenosis. Aorta: The aortic root and ascending aorta are structurally normal, with no evidence of dilitation. Venous: The inferior vena cava was not well visualized. IAS/Shunts: The interatrial septum was not well visualized. Additional Comments: A device lead is visualized in the right atrium and right ventricle.  LEFT VENTRICLE PLAX 2D LVIDd:         5.70 cm LVIDs:         4.80 cm LV PW:         1.25 cm LV IVS:        1.30 cm LVOT diam:     1.80 cm LV SV:         29 LV SV Index:   12 LVOT Area:     2.54 cm  LEFT ATRIUM             Index        RIGHT ATRIUM           Index LA Vol (A2C):   66.8 ml 27.59 ml/m  RA Area:     22.40 cm LA Vol (A4C):   52.0 ml 21.48 ml/m  RA Volume:   66.00 ml  27.26 ml/m LA Biplane Vol: 58.7 ml 24.25 ml/m  AORTIC VALVE AV Area (Vmax):    1.90 cm AV Area (Vmean):   1.81 cm AV Area (VTI):     2.03 cm AV Vmax:           86.60 cm/s AV Vmean:          64.700 cm/s AV VTI:            0.143 m AV Peak Grad:      3.0 mmHg AV Mean  Grad:      2.0 mmHg LVOT Vmax:         64.70 cm/s LVOT Vmean:        46.000 cm/s LVOT VTI:          0.114 m LVOT/AV VTI ratio: 0.80  AORTA Ao Root diam: 3.60 cm Ao Asc diam:  3.50 cm MITRAL VALVE                TRICUSPID VALVE MV Area (PHT): 2.60 cm     TR Peak grad:   22.5 mmHg MV Area VTI:   1.09 cm     TR Vmax:        237.00 cm/s MV Peak grad:  6.4 mmHg MV Mean grad:  3.0 mmHg     SHUNTS MV Vmax:       1.26 m/s     Systemic VTI:  0.11 m MV Vmean:      74.9 cm/s    Systemic Diam: 1.80 cm MV Decel Time: 292 msec MV E velocity: 115.00 cm/s Dorn Ross MD Electronically signed by Dorn Ross MD Signature Date/Time: 07/17/2024/12:38:23 PM    Final    US  Venous Img Lower Unilateral Right Result Date: 07/16/2024 CLINICAL DATA:  Right  lower extremity edema. EXAM: RIGHT LOWER EXTREMITY VENOUS DOPPLER ULTRASOUND TECHNIQUE: Gray-scale sonography with graded compression, as well as color Doppler and duplex ultrasound were performed to evaluate the lower extremity deep venous systems from the level of the common femoral vein and including the common femoral, femoral, profunda femoral, popliteal and calf veins including the posterior tibial, peroneal and gastrocnemius veins when visible. The superficial great saphenous vein was also interrogated. Spectral Doppler was utilized to evaluate flow at rest and with distal augmentation maneuvers in the common femoral, femoral and popliteal veins. COMPARISON:  None Available. FINDINGS: Contralateral Common Femoral Vein: Respiratory phasicity is normal and symmetric with the symptomatic side. No evidence of thrombus. Normal compressibility. Common Femoral Vein: No evidence of thrombus. Normal compressibility, respiratory phasicity and response to augmentation. Saphenofemoral Junction: No evidence of thrombus. Normal compressibility and flow on color Doppler imaging. Profunda Femoral Vein: No evidence of thrombus. Normal compressibility and flow on color Doppler imaging.  Femoral Vein: No evidence of thrombus. Normal compressibility, respiratory phasicity and response to augmentation. Popliteal Vein: No evidence of thrombus. Normal compressibility, respiratory phasicity and response to augmentation. Calf Veins: No evidence of thrombus. Normal compressibility and flow on color Doppler imaging. Superficial Great Saphenous Vein: No evidence of thrombus. Normal compressibility. Venous Reflux:  None. Other Findings: No evidence of superficial thrombophlebitis or abnormal fluid collection. IMPRESSION: No evidence of right lower extremity deep venous thrombosis. Electronically Signed   By: Marcey Moan M.D.   On: 07/16/2024 11:46   DG Chest 2 View Result Date: 07/16/2024 EXAM: 2 VIEW(S) XRAY OF THE CHEST 07/16/2024 10:36:00 AM COMPARISON: 04/19/2024 CLINICAL HISTORY: SOB, CP FINDINGS: LINES, TUBES AND DEVICES: Left-sided defibrillator is unchanged. LUNGS AND PLEURA: Central pulmonary vascular congestion. Elevated right hemidiaphragm. Minimal bibasilar subsegmental atelectasis. No pleural effusion. No pneumothorax. HEART AND MEDIASTINUM: Stable cardiomegaly. Left-sided defibrillator is unchanged. Status post coronary artery bypass graft and cardiac valve repair. BONES AND SOFT TISSUES: No acute osseous abnormality. IMPRESSION: 1. Stable cardiomegaly and central pulmonary vascular congestion. 2. Elevated right hemidiaphragm. 3. Minimal bibasilar subsegmental atelectasis. Electronically signed by: Lynwood Seip MD 07/16/2024 10:55 AM EST RP Workstation: [id number]     Discharge Exam: Vitals:   07/23/24 0355 07/23/24 0741  BP: 90/62 (!) 98/59  Pulse: 74 69  Resp: 18   Temp: 97.8 F (36.6 C) (!) 97.3 F (36.3 C)  SpO2: 97% 92%   Vitals:   07/22/24 2012 07/23/24 0355 07/23/24 0500 07/23/24 0741  BP: 104/67 90/62  (!) 98/59  Pulse: 76 74  69  Resp: 18 18    Temp: 97.7 F (36.5 C) 97.8 F (36.6 C)  (!) 97.3 F (36.3 C)  TempSrc:    Oral  SpO2: 97% 97%  92%  Weight:    105.9 kg   Height:        General: Pt is alert, awake, not in acute distress Cardiovascular: RRR, S1/S2 +, no rubs, no gallops Respiratory: CTA bilaterally, no wheezing, no rhonchi Abdominal: Soft, NT, ND, bowel sounds + Extremities: no edema, no cyanosis    The results of significant diagnostics from this hospitalization (including imaging, microbiology, ancillary and laboratory) are listed below for reference.     Microbiology: No results found for this or any previous visit (from the past 240 hours).   Labs: BNP (last 3 results) Recent Labs    04/17/24 0358 04/24/24 0412 07/02/24 1152  BNP 927.0* 695.0* 663.4*   Basic Metabolic Panel: Recent Labs  Lab 07/19/24 0403 07/20/24 0327 07/21/24 0435 07/22/24  9546 07/23/24 0415  NA 138 139 139 138 137  K 3.9 3.4* 3.9 3.7 3.1*  CL 97* 97* 94* 92* 90*  CO2 32 33* 35* 37* 37*  GLUCOSE 115* 151* 136* 162* 140*  BUN 29* 30* 33* 35* 39*  CREATININE 2.17* 1.89* 2.11* 2.19* 2.27*  CALCIUM  8.8* 8.7* 9.2 9.3 9.3  MG 2.4 2.4 2.4 2.3 2.1   Liver Function Tests: No results for input(s): AST, ALT, ALKPHOS, BILITOT, PROT, ALBUMIN  in the last 168 hours. No results for input(s): LIPASE, AMYLASE in the last 168 hours. No results for input(s): AMMONIA in the last 168 hours. CBC: No results for input(s): WBC, NEUTROABS, HGB, HCT, MCV, PLT in the last 168 hours. Cardiac Enzymes: No results for input(s): CKTOTAL, CKMB, CKMBINDEX, TROPONINI in the last 168 hours. BNP: Invalid input(s): POCBNP CBG: Recent Labs  Lab 07/22/24 0727 07/22/24 1058 07/22/24 1623 07/22/24 2013 07/23/24 0722  GLUCAP 160* 202* 219* 149* 146*   D-Dimer No results for input(s): DDIMER in the last 72 hours. Hgb A1c No results for input(s): HGBA1C in the last 72 hours. Lipid Profile No results for input(s): CHOL, HDL, LDLCALC, TRIG, CHOLHDL, LDLDIRECT in the last 72 hours. Thyroid function  studies No results for input(s): TSH, T4TOTAL, T3FREE, THYROIDAB in the last 72 hours.  Invalid input(s): FREET3 Anemia work up No results for input(s): VITAMINB12, FOLATE, FERRITIN, TIBC, IRON, RETICCTPCT in the last 72 hours. Urinalysis    Component Value Date/Time   COLORURINE YELLOW 04/22/2024 2005   APPEARANCEUR CLEAR 04/22/2024 2005   LABSPEC 1.010 04/22/2024 2005   PHURINE 5.0 04/22/2024 2005   GLUCOSEU >=500 (A) 04/22/2024 2005   HGBUR NEGATIVE 04/22/2024 2005   BILIRUBINUR NEGATIVE 04/22/2024 2005   KETONESUR NEGATIVE 04/22/2024 2005   PROTEINUR NEGATIVE 04/22/2024 2005   UROBILINOGEN 0.2 02/04/2014 0939   NITRITE NEGATIVE 04/22/2024 2005   LEUKOCYTESUR NEGATIVE 04/22/2024 2005   Sepsis Labs No results for input(s): WBC in the last 168 hours.  Invalid input(s): PROCALCITONIN, LACTICIDVEN Microbiology No results found for this or any previous visit (from the past 240 hours).   Time coordinating discharge: 35 minutes  SIGNED:   Adron JONETTA Fairly, DO [facility] 07/23/2024, 11:03 AM  If 7PM-7AM, please contact night-coverage [email or web address]

## 2024-07-23 NOTE — Progress Notes (Signed)
 Progress Note  Patient Name: Wayne Gordon Date of Encounter: 07/23/2024  Primary Cardiologist: Vishnu P Mallipeddi, MD  Interval Summary  Chart reviewed.  Patient preparing to walk with PT.  His weight is back down to baseline.  He diuresed an additional 2500 cc net output last 24 hours.  Vital Signs  Vitals:   07/22/24 2012 07/23/24 0355 07/23/24 0500 07/23/24 0741  BP: 104/67 90/62  (!) 98/59  Pulse: 76 74  69  Resp: 18 18    Temp: 97.7 F (36.5 C) 97.8 F (36.6 C)  (!) 97.3 F (36.3 C)  TempSrc:    Oral  SpO2: 97% 97%  92%  Weight:   105.9 kg   Height:        Intake/Output Summary (Last 24 hours) at 07/23/2024 0956 Last data filed at 07/23/2024 0820 Gross per 24 hour  Intake 766.88 ml  Output 2600 ml  Net -1833.12 ml   Filed Weights   07/21/24 0518 07/22/24 0500 07/23/24 0500  Weight: 109.2 kg 107.4 kg 105.9 kg    Physical Exam  GEN: No acute distress.   Neck: No JVD. Cardiac: RRR, no murmur, rub, or gallop.  Respiratory: Nonlabored. Clear to auscultation bilaterally. GI: Soft, nontender, bowel sounds present. MS: Mild lower leg edema.  ECG/Telemetry  Telemetry reviewed showing atrial pacing.  Burst of NSVT.  Labs  Chemistry Recent Labs  Lab 07/16/24 1053 07/17/24 0419 07/21/24 0435 07/22/24 0453 07/23/24 0415  NA 139   < > 139 138 137  K 3.1*   < > 3.9 3.7 3.1*  CL 96*   < > 94* 92* 90*  CO2 30   < > 35* 37* 37*  GLUCOSE 128*   < > 136* 162* 140*  BUN 31*   < > 33* 35* 39*  CREATININE 2.40*   < > 2.11* 2.19* 2.27*  CALCIUM  8.9   < > 9.2 9.3 9.3  PROT 6.7  --   --   --   --   ALBUMIN  4.0  --   --   --   --   AST 26  --   --   --   --   ALT 10  --   --   --   --   ALKPHOS 89  --   --   --   --   BILITOT 1.3*  --   --   --   --   GFRNONAA 25*   < > 30* 28* 27*  ANIONGAP 13   < > 10 9 10    < > = values in this interval not displayed.    Hematology Recent Labs  Lab 07/16/24 1053  WBC 7.4  RBC 4.85  HGB 13.6  HCT 42.7  MCV  88.0  MCH 28.0  MCHC 31.9  RDW 16.3*  PLT 204    Cardiac Studies  Echocardiogram 07/16/2024:  1. Left ventricular ejection fraction, by estimation, is 30 to 35%. The  left ventricle has moderately decreased function. The left ventricle  demonstrates global hypokinesis. There is moderate left ventricular  hypertrophy. Left ventricular diastolic  parameters are indeterminate. There is the interventricular septum is  flattened in systole and diastole, consistent with right ventricular  pressure and volume overload.   2. RV poorly visualized. Grossly appears enlarged with decreased systolic  function. Indeterminant PASP, IVC poorly visualized. . Right ventricular  systolic function was not well visualized. The right ventricular size is  not well visualized.  3. #30 MEMO 3D RING ANNuLOPLASTY ring is present at the MV anulus. . The  mitral valve has been repaired/replaced. No evidence of mitral valve  regurgitation. No evidence of mitral stenosis. The mean mitral valve  gradient is 3.0 mmHg.   4. The tricuspid valve is abnormal.   5. The aortic valve has an indeterminant number of cusps. There is mild  calcification of the aortic valve. There is mild thickening of the aortic  valve. Aortic valve regurgitation is not visualized. No aortic stenosis is  present.   Assessment & Plan  1.  HFrEF with biventricular heart failure, LVEF approximately 30 to 35%.  Volume status improved to baseline.  2.  Persistent atrial fibrillation, CHA2DS2-VASc score of 5.  Underwent cardioversion in August.  He is asymptomatic at this time and on Coreg  12.5 mg twice daily as well as Eliquis  2.5 mg twice daily.  3.  CKD stage IIIb-IV.  Creatinine 2.27 with GFR 27.  4.  Hypokalemia.  5.  Status post mitral annuloplasty, mean MV gradient 3 mmHg by recent echocardiogram.  6.  Status post Biotronik CRT-D with follow-up by Dr. Waddell.  Patient states that he would like to try to go home today if possible.   Volume status has improved with weight at baseline.  Plan to stop IV Lasix .  Would replete potassium this morning.  Resume Farxiga  10 mg daily, continue Coreg  12.5 mg twice daily.  Would plan to discharge on Demadex  60 mg daily and KCl 20 mEq daily.  Not a candidate for SGLT2 inhibitor/ARB/ARNI.  TOC follow-up with CHF clinic and here in local practice thereafter.  For questions or updates, please contact [facility] Please consult [email or web address] for contact info under   Signed, Jayson Sierras, MD  07/23/2024, 9:56 AM

## 2024-07-23 NOTE — Progress Notes (Signed)
 Palliative:  HPI:  89 y.o. male  with past medical history of HFrEF EF 20-25%, CAD s/p CABG and MVR repair 2013, s/p Biotronik AICD, pacer placement, CKD stage IV, persistent atrial fibrillation/atrial flutter on chronic anticoagulation with apixaban  s/p DCCV, T2DM on insulin  admitted on 07/16/2024 with acute on chronic HFrEF and cardiorenal syndrome requiring Lasix  infusion.   I met again today with Wayne Gordon - no family/visitors to bedside. He is awaiting discharge - his nephew is coming to pick him up. He is hopeful that adjustments in his cardiac medications will be more successful. We discuss further fluid restrictions and salt intake - he is doing well with this at home. We discussed his weakness and exercises to do in bed or chair to try and maintain some strength.   Wayne Gordon had some questions about follow up and I reviewed with him his discharge follow up instructions. He shares that he sleeps better in the hospital with oxygen  at night. Unfortunately it does not seem he is meeting criteria for home oxygen . I encouraged further discussion with PCP and cardiology.   Wayne Gordon' reflects on a provider encouraging him to see a cardiologist and he did not go - he expresses regret that maybe if he had followed up sooner his condition would not be this bad yet. At the same time he does share God has been good to me and that he has had a good life. We discussed the importance of having hope but also preparing for the future. I provided Wayne Gordon with Hard Choices booklet. I encouraged further discussion with palliative as well as his family. No changes to goals at this time. I wished him well at home.   All questions/concerns addressed. Emotional support provided.   Exam: Alert, oriented. Sitting up in recliner. BLE edema present but improved. Able to adjust legs and cross legs independently during my visit. Breathing regular, unlabored at rest. Moves all extremities.   Plan: -  DNR/DNI - Maintain AICD at this time - No desire for hospice at this time - continue outpatient palliative support  25 min  Bernarda Kitty, NP Palliative Medicine Team Pager [phone] (Please see [email or web address] for schedule) Team Phone [phone]

## 2024-07-29 ENCOUNTER — Telehealth (HOSPITAL_COMMUNITY): Payer: Self-pay

## 2024-07-29 NOTE — Telephone Encounter (Signed)
 Called to confirm/remind patient of their appointment at the [facility] on 07/30/2024 11:00.   Appointment:   [x] Confirmed  [] Left mess   [] No answer/No voice mail  [] VM Full/unable to leave message  [] Phone not in service  Patient reminded to bring all medications and/or complete list.  Confirmed patient has transportation. Gave directions, instructed to utilize valet parking.

## 2024-07-30 ENCOUNTER — Ambulatory Visit (HOSPITAL_COMMUNITY): Admit: 2024-07-30 | Discharge: 2024-07-30 | Disposition: A | Attending: Physician Assistant

## 2024-07-30 ENCOUNTER — Ambulatory Visit (HOSPITAL_COMMUNITY): Payer: Self-pay | Admitting: Physician Assistant

## 2024-07-30 VITALS — BP 100/60 | HR 79 | Ht 76.0 in | Wt 233.0 lb

## 2024-07-30 DIAGNOSIS — Z95810 Presence of automatic (implantable) cardiac defibrillator: Secondary | ICD-10-CM | POA: Insufficient documentation

## 2024-07-30 DIAGNOSIS — I5082 Biventricular heart failure: Secondary | ICD-10-CM

## 2024-07-30 DIAGNOSIS — Z794 Long term (current) use of insulin: Secondary | ICD-10-CM | POA: Insufficient documentation

## 2024-07-30 DIAGNOSIS — I340 Nonrheumatic mitral (valve) insufficiency: Secondary | ICD-10-CM | POA: Insufficient documentation

## 2024-07-30 DIAGNOSIS — Z7901 Long term (current) use of anticoagulants: Secondary | ICD-10-CM | POA: Insufficient documentation

## 2024-07-30 DIAGNOSIS — I255 Ischemic cardiomyopathy: Secondary | ICD-10-CM | POA: Diagnosis not present

## 2024-07-30 DIAGNOSIS — Z66 Do not resuscitate: Secondary | ICD-10-CM | POA: Insufficient documentation

## 2024-07-30 DIAGNOSIS — I5022 Chronic systolic (congestive) heart failure: Secondary | ICD-10-CM | POA: Insufficient documentation

## 2024-07-30 DIAGNOSIS — Z7984 Long term (current) use of oral hypoglycemic drugs: Secondary | ICD-10-CM | POA: Insufficient documentation

## 2024-07-30 DIAGNOSIS — N184 Chronic kidney disease, stage 4 (severe): Secondary | ICD-10-CM | POA: Diagnosis not present

## 2024-07-30 DIAGNOSIS — M549 Dorsalgia, unspecified: Secondary | ICD-10-CM | POA: Insufficient documentation

## 2024-07-30 DIAGNOSIS — I2510 Atherosclerotic heart disease of native coronary artery without angina pectoris: Secondary | ICD-10-CM | POA: Diagnosis not present

## 2024-07-30 DIAGNOSIS — I4892 Unspecified atrial flutter: Secondary | ICD-10-CM

## 2024-07-30 DIAGNOSIS — E1122 Type 2 diabetes mellitus with diabetic chronic kidney disease: Secondary | ICD-10-CM | POA: Insufficient documentation

## 2024-07-30 DIAGNOSIS — R0602 Shortness of breath: Secondary | ICD-10-CM | POA: Insufficient documentation

## 2024-07-30 DIAGNOSIS — I130 Hypertensive heart and chronic kidney disease with heart failure and stage 1 through stage 4 chronic kidney disease, or unspecified chronic kidney disease: Secondary | ICD-10-CM | POA: Insufficient documentation

## 2024-07-30 DIAGNOSIS — Z79899 Other long term (current) drug therapy: Secondary | ICD-10-CM | POA: Insufficient documentation

## 2024-07-30 DIAGNOSIS — M25569 Pain in unspecified knee: Secondary | ICD-10-CM | POA: Insufficient documentation

## 2024-07-30 DIAGNOSIS — Z951 Presence of aortocoronary bypass graft: Secondary | ICD-10-CM | POA: Insufficient documentation

## 2024-07-30 LAB — COMPREHENSIVE METABOLIC PANEL WITH GFR
ALT: 16 U/L (ref 0–44)
AST: 28 U/L (ref 15–41)
Albumin: 3.6 g/dL (ref 3.5–5.0)
Alkaline Phosphatase: 75 U/L (ref 38–126)
Anion gap: 9 (ref 5–15)
BUN: 38 mg/dL — ABNORMAL HIGH (ref 8–23)
CO2: 38 mmol/L — ABNORMAL HIGH (ref 22–32)
Calcium: 8.7 mg/dL — ABNORMAL LOW (ref 8.9–10.3)
Chloride: 93 mmol/L — ABNORMAL LOW (ref 98–111)
Creatinine, Ser: 2.7 mg/dL — ABNORMAL HIGH (ref 0.61–1.24)
GFR, Estimated: 22 mL/min — ABNORMAL LOW (ref >60)
Glucose, Bld: 214 mg/dL — ABNORMAL HIGH (ref 70–99)
Potassium: 2.9 mmol/L — ABNORMAL LOW (ref 3.5–5.1)
Sodium: 140 mmol/L (ref 135–145)
Total Bilirubin: 1.7 mg/dL — ABNORMAL HIGH (ref 0.0–1.2)
Total Protein: 6.8 g/dL (ref 6.5–8.1)

## 2024-07-30 LAB — BRAIN NATRIURETIC PEPTIDE: B Natriuretic Peptide: 601.5 pg/mL — ABNORMAL HIGH (ref 0.0–100.0)

## 2024-07-30 MED ORDER — POTASSIUM CHLORIDE CRYS ER 20 MEQ PO TBCR: 40.0000 meq | EXTENDED_RELEASE_TABLET | Freq: Every day | ORAL | 5 refills | Status: AC

## 2024-07-30 MED ORDER — TORSEMIDE 20 MG PO TABS: 40.0000 mg | ORAL_TABLET | Freq: Every day | ORAL | 3 refills | Status: AC

## 2024-07-30 MED ORDER — TORSEMIDE 60 MG PO TABS: 40.0000 mg | ORAL_TABLET | Freq: Every day | ORAL | 3 refills | Status: DC

## 2024-07-30 NOTE — Patient Instructions (Signed)
 Good to see you today!  Labs done today, your results will be available in MyChart, we will contact you for abnormal readings.  Follow up  with us  as needed

## 2024-07-30 NOTE — Progress Notes (Signed)
 HEART & VASCULAR TRANSITION OF CARE CONSULT NOTE     Referring Physician: Dr. Maree PCP: Renato Dorothey HERO, NP  Cardiologist: Dr. Stacia  Chief Complaint: CHF   HPI: Referred to clinic by Dr. Maree with [facility] for heart failure consultation.   Wayne Gordon is a 89 y.o. male with history of HFrEF s/p CRT-D, hx angioedema with ACE-I, CAD s/p CABG and mitral valve repair in 2013, CKD IV, persistent Afib/flutter, DM II on insulin .    He's had multiple hospitalizations for acute on chronic CHF. Palliative Care consulted during prior admission in August.  Code status changed to DNR. Plan to follow with Palliative Care in the community, not ready to transition to hospice. Has wanted to keep ICD shock therapies on.    Admitted 11/25 with acute on chronic CHF. Diuresed with lasix  gtt and metolazone .  GDMT limited by CKD. Echo during admit with EF 30-35%, septum flattened in systole and diastole, RV poorly visualized, stable MV repair with mean gradient 3 mmHg.  He is here today for post hospital CHF follow-up. He is accompanied by his nephew who assists with the history. Has been doing well since discharge. His weight is down another 11 lb, most recent weight at home 224 lb. Notes a little bit of leg edema when he sits with his feet down, improves with elevation. Limited by combination of back pain, knee pain and shortness of breath. Ambulates with a walker. No orthopnea or PND. Taking all medications as prescribed. Does not add salt to food but occasionally orders take out. Had a philly cheesesteak last night.    Past Medical History:  Diagnosis Date   Allergy to ACE inhibitors    Angioedema many years ago; patient has tolerated Losartan  (ARB) in the past - it was held during admisson for GI bleed and worsening renal function >> resume Losartan  25 mg QD 06/2015   Arthritis    Atrial fibrillation (HCC)    PAF, CHADs2Vasc = 5   Carotid artery disease    60-79% LICA   CHF (congestive  heart failure) (HCC)    CKD (chronic kidney disease)    Coronary artery disease    Diabetes mellitus    GERD (gastroesophageal reflux disease)    Hyperlipidemia    Hypertension    ICD (implantable cardiac defibrillator) in place 03/28/2012   Biotronik, Dr. Waddell 03/28/12   Ischemic cardiomyopathy    S/P CABG x 3; EF 25%   Mitral regurgitation    S/P mitral valve repair 2013   Myocardial infarction [facility])    Renal insufficiency     Current Outpatient Medications  Medication Sig Dispense Refill   apixaban  (ELIQUIS ) 2.5 MG TABS tablet Take 1 tablet (2.5 mg total) by mouth 2 (two) times daily. 60 tablet 0   carvedilol  (COREG ) 12.5 MG tablet Take 12.5 mg by mouth 2 (two) times daily with a meal.     dapagliflozin  propanediol (FARXIGA ) 10 MG TABS tablet Take 1 tablet (10 mg total) by mouth daily. 90 tablet 2   gabapentin  (NEURONTIN ) 100 MG capsule Take 3 capsules (300 mg total) by mouth at bedtime. (Patient taking differently: Take 100 mg by mouth at bedtime.) 90 capsule 0   guaiFENesin  (ROBITUSSIN) 100 MG/5ML liquid Take 5 mLs by mouth every 4 (four) hours as needed for cough or to loosen phlegm. 120 mL 0   HUMALOG  KWIKPEN 100 UNIT/ML KwikPen Inject 4-10 Units into the skin 2 (two) times daily. 4 units lunch, 10  units dinner 15 mL 0   insulin  glargine, 1 Unit Dial , (TOUJEO  SOLOSTAR) 300 UNIT/ML Solostar Pen Inject 26 Units into the skin daily. 4.5 mL 0   omeprazole  (PRILOSEC) 40 MG capsule Take 1 capsule (40 mg total) by mouth daily before breakfast. 30 capsule 0   potassium chloride  SA (KLOR-CON  M) 20 MEQ tablet Take 1 tablet (20 mEq total) by mouth daily. 30 tablet 2   rosuvastatin  (CRESTOR ) 5 MG tablet Take 1 tablet (5 mg total) by mouth daily. 30 tablet 0   Torsemide  60 MG TABS Take 60 mg by mouth daily. (Patient taking differently: Take 20 mg by mouth 3 (three) times daily.) 30 tablet 2   melatonin 5 MG TABS Take 5 mg by mouth at bedtime.     No current facility-administered  medications for this encounter.    Allergies  Allergen Reactions   Ace Inhibitors Swelling and Other (See Comments)    Kidney issues; angioedema per PMH in chart   Penicillins Palpitations      Social History   Socioeconomic History   Marital status: Married    Spouse name: Not on file   Number of children: Not on file   Years of education: Not on file   Highest education level: Some college, no degree  Occupational History   Occupation: Retired  Tobacco Use   Smoking status: Never   Smokeless tobacco: Never  Vaping Use   Vaping status: Never Used  Substance and Sexual Activity   Alcohol use: No    Alcohol/week: 0.0 standard drinks of alcohol   Drug use: No   Sexual activity: Yes  Other Topics Concern   Not on file  Social History Narrative   Not on file   Social Drivers of Health   Financial Resource Strain: Medium Risk (06/30/2024)   Received from [facility]   Overall Financial Resource Strain (CARDIA)    How hard is it for you to pay for the very basics like food, housing, medical care, and heating?: Somewhat hard  Food Insecurity: Patient Declined (07/16/2024)   Hunger Vital Sign    Worried About Running Out of Food in the Last Year: Patient declined    Ran Out of Food in the Last Year: Patient declined  Transportation Needs: Patient Declined (07/16/2024)   PRAPARE - Administrator, Civil Service (Medical): Patient declined    Lack of Transportation (Non-Medical): Patient declined  Physical Activity: Inactive (06/30/2024)   Received from [facility]   Exercise Vital Sign    On average, how many days per week do you engage in moderate to strenuous exercise (like a brisk walk)?: 0 days    Minutes of Exercise per Session: Not on file  Stress: No Stress Concern Present (06/30/2024)   Received from [facility] - Occupational Stress Questionnaire    Do you feel stress - tense, restless, nervous, or  anxious, or unable to sleep at night because your mind is troubled all the time - these days?: Only a little  Social Connections: Patient Declined (07/16/2024)   Social Connection and Isolation Panel    Frequency of Communication with Friends and Family: Patient declined    Frequency of Social Gatherings with Friends and Family: Patient declined    Attends Religious Services: Patient declined    Database Administrator or Organizations: Patient declined    Attends Banker Meetings: Patient declined    Marital Status: Patient declined  Intimate Partner Violence: Patient Declined (07/16/2024)   Humiliation, Afraid, Rape, and Kick questionnaire    Fear of Current or Ex-Partner: Patient declined    Emotionally Abused: Patient declined    Physically Abused: Patient declined    Sexually Abused: Patient declined      Family History  Problem Relation Age of Onset   Heart disease Mother    Diabetes Father    Cardiomyopathy Father    Colon cancer Neg Hx     Vitals:   07/30/24 1111  BP: 100/60  Pulse: 79  SpO2: 94%  Weight: 105.7 kg (233 lb)  Height: 6' 4 (1.93 m)    PHYSICAL EXAM: General:  Elderly male. Arrived in wheelchair. Cor: JVP ~ 7 cm. Regular rate & rhythm. No murmurs. Lungs: clear Abdomen: soft, nontender, nondistended.  Extremities: 1+ pretibial edema Neuro: alert & oriented x 3. Affect pleasant.  ECG: V paced 90 bpm, QRS 180 ms, ? Underlying sinus rhythm. Artifact noted from spinal cord stimulator   ASSESSMENT & PLAN:  Biventricular heart failure - ICM - EF has prevoiusly been in range of 20-25% - Most recent echo 11/25: EF 30-35%, septum flattened in systole and diastole, RV poorly visualized, stable MV repair with mean gradient 3 mmHg - Hx ICD upgrade to BiV ICD in 2022. BiV pacing 95% on last interrogation in 10/25 but QRS remains wide (180 ms). Will review with EP to see if any changes need to be made to optimize his device.  - NYHA III. Limited  by combination of shortness of breath, back pain and knee pain. Continue Torsemide  60 mg daily. Check labs today. Limit sodium intake. - Continue coreg  12.5 mg Bid - Continue Farxiga  10 mg daily, watch for GU infection - Has been unable to tolerate additional GDMT d/t low BP and CKD. - Not a candidate for advanced therapies. Now DNR. Has opted to keep device therapies on for now.  CAD  - S/p CABG X 3 in 2013 - No angina - No aspirin  with need for anticoagulation. Continue rosuvastatin . - Management per cardiology team.  Mitral regurgitation - S/p mitral valve repair at time of CABG - Stable on recent echo  AF/AFL - S/p cardioversion in 08/25 - No recurrence on device check 10/25 - On eliquis  2.5 mg BID.  Looks like SR on ECG today but hard to tell. Unable to interrogate device in clinic. - Unable to tolerate amiodarone .  CKD IV - Scr baseline low 2s - Continue SGLT2i - Labs today   Referred to HFSW (PCP, Medications, Transportation, ETOH Abuse, Drug Abuse, Insurance, Financial ): No Refer to Pharmacy: No Refer to Home Health: No Refer to [facility]: no  Refer to General Cardiology: No, already established  Follow up  PRN, keep follow-up with Cardiology on 08/16/24

## 2024-08-02 LAB — BASIC METABOLIC PANEL WITH GFR
BUN/Creatinine Ratio: 16 (ref 10–24)
BUN: 42 mg/dL — ABNORMAL HIGH (ref 8–27)
CO2: 29 mmol/L (ref 20–29)
Calcium: 9 mg/dL (ref 8.6–10.2)
Chloride: 94 mmol/L — ABNORMAL LOW (ref 96–106)
Creatinine, Ser: 2.7 mg/dL — ABNORMAL HIGH (ref 0.76–1.27)
Glucose: 178 mg/dL — ABNORMAL HIGH (ref 70–99)
Potassium: 4.5 mmol/L (ref 3.5–5.2)
Sodium: 140 mmol/L (ref 134–144)
eGFR: 22 mL/min/1.73 — ABNORMAL LOW (ref >59)

## 2024-08-06 ENCOUNTER — Other Ambulatory Visit: Payer: Self-pay | Admitting: Adult Health

## 2024-08-16 ENCOUNTER — Encounter: Payer: Self-pay | Admitting: Nurse Practitioner

## 2024-08-16 ENCOUNTER — Ambulatory Visit: Attending: Nurse Practitioner | Admitting: Nurse Practitioner

## 2024-08-16 VITALS — BP 100/62 | HR 81 | Ht 76.0 in | Wt 239.8 lb

## 2024-08-16 DIAGNOSIS — Z9889 Other specified postprocedural states: Secondary | ICD-10-CM | POA: Diagnosis not present

## 2024-08-16 DIAGNOSIS — N184 Chronic kidney disease, stage 4 (severe): Secondary | ICD-10-CM | POA: Diagnosis not present

## 2024-08-16 DIAGNOSIS — L8990 Pressure ulcer of unspecified site, unspecified stage: Secondary | ICD-10-CM | POA: Diagnosis not present

## 2024-08-16 DIAGNOSIS — I5084 End stage heart failure: Secondary | ICD-10-CM

## 2024-08-16 DIAGNOSIS — E7849 Other hyperlipidemia: Secondary | ICD-10-CM | POA: Diagnosis not present

## 2024-08-16 DIAGNOSIS — Z95810 Presence of automatic (implantable) cardiac defibrillator: Secondary | ICD-10-CM

## 2024-08-16 DIAGNOSIS — I10 Essential (primary) hypertension: Secondary | ICD-10-CM

## 2024-08-16 DIAGNOSIS — I4891 Unspecified atrial fibrillation: Secondary | ICD-10-CM

## 2024-08-16 DIAGNOSIS — I2510 Atherosclerotic heart disease of native coronary artery without angina pectoris: Secondary | ICD-10-CM

## 2024-08-16 NOTE — Progress Notes (Unsigned)
 " Cardiology Office Note   Date:  05/29/2024 ID:  [name], DOB 11-13-1933, MRN [id number] PCP: Renato Dorothey HERO, NP  [facility] Providers Cardiologist:  Diannah SHAUNNA Maywood, MD     History of Present Illness Wayne Gordon is a 89 y.o. male with a PMH of CAD, s/p CABG x 3, history of mitral valve repair in 2013, hypertension, hyperlipidemia, persistent A-fib/ A-flutter, heart failure, sinus node dysfunction, s/p biventricular ICD in 2022, vertigo, CKD, carotid artery disease, type 2 diabetes, who presents today for 4-6 week follow-up. Followed by Dr. Waddell with electrophysiology.   Last seen for preoperative cardiovascular risk assessment in August 2023.  He was pending spinal cord stimulator at that time.  He overall was doing well.  Last seen by Dr. Waddell on March 06, 2024.  Was overall doing well at the time.  Hospitalized early August 2025 due to weakness.  He presented with weakness that have been progressive since recent ICD adjustment.  ICD was interrogated.  Patient had a flutter that was felt to be the etiology of his current symptoms.  He was started on oral amiodarone  load 400 mg twice daily for 7 days, then to reduce to 200 mg twice daily x 7 days, then 200 mg daily thereafter.  He was diuresed with IV Lasix  due to heart failure exacerbation and underwent cardioversion on April 01, 2024.  He contacted our office shortly after hospital discharge noting issues with amiodarone -stated the medicine was making muscles in his legs and arms lose strength.  Dr. Waddell instructed for patient to stop amiodarone . He contacted the office on 04/08/2024 noting more leg swelling, weight was up 6 pounds in a week.  Denied any shortness of breath.  He had taken 80 mg of Lasix  on 04/09/2024.  He was given appointment to be further evaluated in the office.  04/11/2024 - Today he presents for weight gain and leg swelling evaluation. He states he is about to start rehab for his vertigo. Admits  to swelling noticed along his right leg and weight gain. He sees a specialist in [place] that manages his diabetes and his kidneys. Does admit to occaional hard beats noticed about only 1-2 times per day, not bothersome per his report. Denies any chest pain, shortness of breath, syncope, presyncope, orthopnea, PND, acute bleeding, or claudication.  Tells me he has recently take increased dose of Lasix  per his report, has not noticed much difference.  Confirms to me he is taking Lasix  40 mg daily.  Hospitalized August 2025 d/t acute HFrEF and also noted acute weakness, as well as severe deconditioning, frail and significant peripheral edema.  Hospital course noted by acute on chronic renal failure in part due to cardiorenal syndrome, developed new oxygen  requirement on 2 L.  Palliative care was consulted, patient became DNR.  Creatinine levels did start to downtrend, cardiology was consulted and recommended to continue oral torsemide  40 mg daily.  Today he presents for hospital follow-up. Says he was treated for PNA and is feeling better, breathing well per his report. He is doing therapy for his vertigo.  Tells me he checks his SpO2 on his pulse ox that shows 96% on room air. Denies any chest pain, shortness of breath, palpitations, syncope, presyncope, dizziness, orthopnea, PND, swelling or significant weight changes, acute bleeding, or claudication.  ____  Recently hospitalized in November 2025 for acute on chronic HFrEF exacerbation.  Had good diuresis while in the hospital.  Cardiology recommended to remain on torsemide   60 mg daily.  He was going to follow-up with palliative care outpatient.   Seen by heart failure clinic on July 30, 2024.  Have been doing well since discharge.  Noticed a little bit of leg edema, improved with elevation.  BNP 602.  CMP showed overall kidney function trending up with low potassium-2.9.   He is here for follow-up. ***    ROS: Negative. See HPI.    Studies Reviewed      EKG: EKG is not ordered today.   Echo 03/2024:   1. No LV thrombus by Definity . Left ventricular ejection fraction, by  estimation, is 20 to 25%. The left ventricle has severely decreased  function. The left ventricle demonstrates global hypokinesis. The left  ventricular internal cavity size was mildly  dilated. There is mild concentric left ventricular hypertrophy. Left  ventricular diastolic parameters are indeterminate. There is the  interventricular septum is flattened in systole and diastole, consistent  with right ventricular pressure and volume  overload.   2. Right ventricular systolic function is mildly reduced. The right  ventricular size is moderately enlarged. There is normal pulmonary artery  systolic pressure. The estimated right ventricular systolic pressure is  32.8 mmHg.   3. Left atrial size was mildly dilated.   4. S/p Sorin Memo 3D ring annuloplasty (size 30mm, Catalog #[id number], serial  [id number]). The mitral valve has been repaired/replaced. Mild mitral valve  regurgitation. No evidence of mitral stenosis. The mean mitral valve  gradient is 3.0 mmHg with average  heart rate of 85 bpm. Procedure Date: 10/17/11. Echo findings are  consistent with normal structure and function of the mitral valve  prosthesis.   5. The tricuspid valve is abnormal. Tricuspid valve regurgitation is  moderate.   6. The aortic valve is tricuspid. There is moderate calcification of the  aortic valve. There is moderate thickening of the aortic valve. Aortic  valve regurgitation is trivial. Aortic valve sclerosis/calcification is  present, without any evidence of  aortic stenosis.   7. The inferior vena cava is normal in size with <50% respiratory  variability, suggesting right atrial pressure of 8 mmHg.  Lexiscan  07/2015: Defect 1: There is a large defect of severe severity present in the basal inferior, basal inferolateral, basal anterolateral, mid inferior, mid  inferolateral, mid anterolateral, apical inferior and apical lateral location. Findings consistent with prior myocardial infarction. This is a high risk study. Nuclear stress EF: 29%. The aforementioned walls are severely hypokinetic to akinetic.  Physical Exam VS:  BP 100/62   Pulse 81   Ht 6' 4 (1.93 m)   Wt 239 lb 12.8 oz (108.8 kg)   SpO2 99%   BMI 29.19 kg/m        Wt Readings from Last 3 Encounters:  08/16/24 239 lb 12.8 oz (108.8 kg)  07/30/24 233 lb (105.7 kg)  07/23/24 233 lb 7.5 oz (105.9 kg)    GEN: Well nourished, well developed in no acute distress NECK: No JVD; No carotid bruits CARDIAC: S1/S2, RRR, no murmurs, rubs, gallops RESPIRATORY:  Clear and diminished to auscultation without wheezing, rales, or rhonchi noted ABDOMEN: Soft, non-tender, non-distended EXTREMITIES:  Nonpitting edema to BLE; No deformity   ASSESSMENT AND PLAN  HFrEF Stage C, NYHA class I-II symptoms.  EF 20-25% 03/2024. Weight is down over 10 lbs from last office visit.  Continue current medication regimen.  GDMT limited due to his CKD stage IV. Low sodium diet, fluid restriction <2L, and daily weights encouraged. Educated to contact our  office for weight gain of 2 lbs overnight or 5 lbs in one week.  Care and ED precautions discussed.  CAD, s/p CABG x 3 Stable with no anginal symptoms. No indication for ischemic evaluation.  Not on aspirin  due to being on low-dose Eliquis .  Continue current medication regimen. Heart healthy diet and regular cardiovascular exercise encouraged.   Hx of mitral valve repair, valvular insufficiency Most recent echocardiogram revealed Sorin Memo 3D ring annuloplasty.  Mild mitral valve regurgitation noted.  Also findings of moderate TR with aortic valve sclerosis also noted.  Denies any shortness of breath. Will continue to monitor.   HTN BP stable. Discussed to monitor BP at home at least 2 hours after medications and sitting for 5-10 minutes.  No medication  changes at this time getting labs as noted above. Heart healthy diet and regular cardiovascular exercise encouraged.   HLD LDL from last April 2025 61. Continue rosuvastatin .  Not addressed, but at next office visit, plan to discuss increasing dosage or adding Zetia. Goal LDL < 55. Heart healthy diet and regular cardiovascular exercise encouraged.   A-fib Denies any recent tachycardia or palpitations.  Heart rate is well-controlled today. Heart healthy diet and regular cardiovascular exercise encouraged. Continue low dose Eliquis  for stroke prevention. Denies any bleeding issues.   Sinus node dysfunction, s/p biventricular ICD in 2022 Most recent remote device check showed no arrhythmias. Continue to follow-up with EP.   CKD stage IV Most recent kidney function stable.  Avoid nephrotoxic agents.  Now seeing a nephrologist locally (Dr. Rachele), appreciate recs.  Continue follow-up with nephrology.  9. Veritgo Continue physical therapy treatments.  Did recommend he discuss meclizine with his PCP.   Dispo: Follow-up with MD/APP in 4-6 weeks or sooner if anything changes.   Signed, Almarie Crate, NP   "

## 2024-08-16 NOTE — Patient Instructions (Addendum)
 Medication Instructions:   Continue all current medications.   Labwork:  none  Testing/Procedures:  none  Follow-Up:  6-8 weeks  Any Other Special Instructions Will Be Listed Below (If Applicable).  You have been referred to:  Palliative Care  You have been referred to:  Wound Care   If you need a refill on your cardiac medications before your next appointment, please call your pharmacy.

## 2024-08-22 ENCOUNTER — Ambulatory Visit

## 2024-08-22 DIAGNOSIS — I5041 Acute combined systolic (congestive) and diastolic (congestive) heart failure: Secondary | ICD-10-CM

## 2024-08-23 ENCOUNTER — Other Ambulatory Visit: Payer: Self-pay | Admitting: Adult Health

## 2024-08-23 NOTE — Telephone Encounter (Unsigned)
 Copied from CRM #[id number]. Topic: Clinical - Medication Refill >> Aug 23, 2024 11:05 AM Vivian Z wrote: Medication: HUMALOG  KWIKPEN 100 UNIT/ML KwikPen  Has the patient contacted their pharmacy? Yes (Agent: If no, request that the patient contact the pharmacy for the refill. If patient does not wish to contact the pharmacy document the reason why and proceed with request.) (Agent: If yes, when and what did the pharmacy advise?)  This is the patient's preferred pharmacy:  [facility] - [place], [place] - [address] [place] [address] Phone: [phone] Fax: [phone]  Is this the correct pharmacy for this prescription? Yes If no, delete pharmacy and type the correct one.   Has the prescription been filled recently? No  Is the patient out of the medication? No  Has the patient been seen for an appointment in the last year OR does the patient have an upcoming appointment? Yes  Can we respond through MyChart? No  Agent: Please be advised that Rx refills may take up to 3 business days. We ask that you follow-up with your pharmacy.

## 2024-08-24 LAB — CUP PACEART REMOTE DEVICE CHECK
Date Time Interrogation Session: 20251226173007
Implantable Lead Connection Status: 753985
Implantable Lead Connection Status: 753985
Implantable Lead Connection Status: 753985
Implantable Lead Implant Date: 20130731
Implantable Lead Implant Date: 20220214
Implantable Lead Implant Date: 20220214
Implantable Lead Location: 753858
Implantable Lead Location: 753859
Implantable Lead Location: 753860
Implantable Lead Model: 365
Implantable Lead Model: 377
Implantable Lead Model: 3830
Implantable Lead Serial Number: 10505793
Implantable Lead Serial Number: 8000207354
Implantable Pulse Generator Implant Date: 20220214
Pulse Gen Model: 429553
Pulse Gen Serial Number: 84824950

## 2024-08-26 NOTE — Progress Notes (Signed)
 Remote ICD Transmission

## 2024-08-30 ENCOUNTER — Telehealth: Payer: Self-pay

## 2024-08-30 NOTE — Telephone Encounter (Signed)
 Patient reports no symptoms during daytime but unable to sleep at night well d/t palpations. No other symptoms noted.    Adviosed patient will forward to Dr. Almetta ODESSIA Norris, NP to review and patient aware to call if any changes arise. Pt voiced understanding and agreeable to plan.

## 2024-08-30 NOTE — Telephone Encounter (Addendum)
 Biotronik alert received for increase in AT/AF burden >7 days. EGM appears A-Flutter w/ controlled VR's.     Per last OV note 08/16/24 with gen cards Almarie Crate, NP, see below regarding AMIO.

## 2024-08-31 ENCOUNTER — Ambulatory Visit: Payer: Self-pay | Admitting: Student in an Organized Health Care Education/Training Program

## 2024-09-03 ENCOUNTER — Other Ambulatory Visit: Payer: Self-pay

## 2024-09-03 ENCOUNTER — Ambulatory Visit (HOSPITAL_COMMUNITY): Attending: Nurse Practitioner | Admitting: Physical Therapy

## 2024-09-03 DIAGNOSIS — L89620 Pressure ulcer of left heel, unstageable: Secondary | ICD-10-CM | POA: Diagnosis present

## 2024-09-03 DIAGNOSIS — M79672 Pain in left foot: Secondary | ICD-10-CM | POA: Diagnosis present

## 2024-09-03 DIAGNOSIS — L8990 Pressure ulcer of unspecified site, unspecified stage: Secondary | ICD-10-CM | POA: Insufficient documentation

## 2024-09-03 NOTE — Therapy (Signed)
 " OUTPATIENT PHYSICAL THERAPY Wound  EVALUATION   Patient Name: Wayne Gordon MRN: [id number] DOB:1934/06/28, 89 y.o., male Today's Date: 09/03/2024   PCP:   Renato Record REFERRING PROVIDER: Miriam Norris, NP  END OF SESSION:  PT End of Session - 09/03/24 1342     Visit Number 1    Number of Visits 1    Authorization Type [employer]    PT Start Time 1300    PT Stop Time 1338    PT Time Calculation (min) 38 min          Past Medical History:  Diagnosis Date   Allergy to ACE inhibitors    Angioedema many years ago; patient has tolerated Losartan  (ARB) in the past - it was held during admisson for GI bleed and worsening renal function >> resume Losartan  25 mg QD 06/2015   Arthritis    Atrial fibrillation (HCC)    PAF, CHADs2Vasc = 5   Carotid artery disease    60-79% LICA   CHF (congestive heart failure) (HCC)    CKD (chronic kidney disease)    Coronary artery disease    Diabetes mellitus    GERD (gastroesophageal reflux disease)    Hyperlipidemia    Hypertension    ICD (implantable cardiac defibrillator) in place 03/28/2012   [place]ron[place], Dr. Waddell 03/28/12   Ischemic cardiomyopathy    S/P CABG x 3; EF 25%   Mitral regurgitation    S/P mitral valve repair 2013   Myocardial infarction [facility])    Renal insufficiency    Past Surgical History:  Procedure Laterality Date   BIV UPGRADE N/A 10/12/2020   Procedure: BIV ICD UPGRADE;  Surgeon: Waddell Danelle ORN, MD;  Location: [facility] INVASIVE CV LAB;  Service: Cardiovascular;  Laterality: N/A;   CARDIOVERSION N/A 04/01/2024   Procedure: CARDIOVERSION;  Surgeon: Lonni Slain, MD;  Location: [facility] INVASIVE CV LAB;  Service: Cardiovascular;  Laterality: N/A;   CHOLECYSTECTOMY N/A 09/20/2017   Procedure: LAPAROSCOPIC SUBTOTAL CHOLECYSTECTOMY;  Surgeon: Rubin Calamity, MD;  Location: [facility] OR;  Service: General;  Laterality: N/A;   COLONOSCOPY N/A 04/24/2014   RMR Pancolonic diverticulosis   CORONARY ARTERY BYPASS GRAFT   10/17/2011   Procedure: CORONARY ARTERY BYPASS GRAFTING (CABG);  Surgeon: Sudie VEAR Laine, MD;  Location: [facility] OR;  Service: Open Heart Surgery;  Laterality: N/A;   ERCP N/A 09/29/2017   Procedure: ENDOSCOPIC RETROGRADE CHOLANGIOPANCREATOGRAPHY (ERCP);  Surgeon: Rollin Dover, MD;  Location: [facility] ENDOSCOPY;  Service: Endoscopy;  Laterality: N/A;   ESOPHAGOGASTRODUODENOSCOPY N/A 04/24/2014   RMR Subtle nodularity the gastric mucosa of uncertain significance-status post biopsy. Hiatal hernia. chronic inflammation, no H.pylori   ESOPHAGOGASTRODUODENOSCOPY N/A 06/24/2016   Dr. Shaaron: normal esophagus, small hiatal hernia, normal duodenum   ICD  03/28/2012   IMPLANTABLE CARDIOVERTER DEFIBRILLATOR IMPLANT N/A 03/28/2012   Procedure: IMPLANTABLE CARDIOVERTER DEFIBRILLATOR IMPLANT;  Surgeon: Danelle ORN Waddell, MD;  Location: [facility] CATH LAB;  Service: Cardiovascular;  Laterality: N/A;   KNEE ARTHROSCOPY  ~ 2008   right   LEFT AND RIGHT HEART CATHETERIZATION WITH CORONARY ANGIOGRAM N/A 10/12/2011   Procedure: LEFT AND RIGHT HEART CATHETERIZATION WITH CORONARY ANGIOGRAM;  Surgeon: Deatrice DELENA Cage, MD;  Location: [facility] CATH LAB;  Service: Cardiovascular;  Laterality: N/A;   MITRAL VALVE REPAIR  10/17/2011   Procedure: MITRAL VALVE REPAIR (MVR);  Surgeon: Sudie VEAR Laine, MD;  Location: [facility] OR;  Service: Open Heart Surgery;  Laterality: N/A;   NASAL SINUS SURGERY  1990's   right   Patient  Active Problem List   Diagnosis Date Noted   NSVT (nonsustained ventricular tachycardia) (HCC) 07/22/2024   Acute on chronic combined systolic and diastolic CHF (congestive heart failure) (HCC) 07/22/2024   CKD (chronic kidney disease) stage 4, GFR 15-29 ml/min (HCC) 07/16/2024   Hypertension associated with stage 4 chronic kidney disease due to type 2 diabetes mellitus (HCC) 04/30/2024   Hyperlipidemia associated with type 2 diabetes mellitus (HCC) 04/30/2024   Diabetic peripheral neuropathy (HCC) 04/30/2024   CKD stage 4 due to type 2  diabetes mellitus (HCC) 04/30/2024   DM (diabetes mellitus), type 2 with peripheral vascular complications (HCC) 04/27/2024   Neurocognitive deficits 04/27/2024   Lobar pneumonia 04/23/2024   Acute metabolic encephalopathy 04/22/2024   Acute renal failure superimposed on stage 4 chronic kidney disease (HCC) 04/17/2024   Acute on chronic HFrEF (heart failure with reduced ejection fraction) (HCC) 04/17/2024   Acute combined systolic and diastolic heart failure (HCC) 04/16/2024   Chronic kidney disease (CKD), stage IV (severe) (HCC) 04/16/2024   Abnormal weight gain 04/16/2024   Hypokalemia 04/16/2024   Atrial flutter (HCC) 04/01/2024   Acute CHF (congestive heart failure) (HCC) 03/29/2024   Human metapneumovirus pneumonia 10/16/2021   Acute on chronic systolic CHF (congestive heart failure) (HCC) 10/16/2021   CHF exacerbation (HCC) 10/15/2021   CAD (coronary artery disease) of artery bypass graft 10/15/2021   History of implantable cardioverter-defibrillator (ICD) placement 10/15/2021   Benign essential HTN 10/15/2021   Chronic kidney disease, stage 3b (HCC) 10/15/2021   Acute respiratory failure with hypoxia (HCC) 10/14/2021   Bloating 02/27/2020   Nausea with vomiting 02/27/2020   Abdominal pain 02/27/2020   Iron deficiency anemia 02/08/2018   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   Bacteremia due to Klebsiella pneumoniae 09/22/2017   Sepsis due to Klebsiella pneumoniae (HCC) 09/22/2017   Cholangitis (HCC) 09/17/2017   Leukocytosis 09/17/2017   Cholelithiasis 09/17/2017   Elevated liver enzymes 09/17/2017   Acute renal failure superimposed on stage 3 chronic kidney disease (HCC) 09/17/2017   Low blood pressure reading 09/17/2017   Lactic acidosis 09/17/2017   Elevated troponin 09/17/2017   Nausea without vomiting 01/10/2017   Gastrointestinal hemorrhage associated with intestinal diverticulosis 06/23/2016   Carotid artery disease    Syncope and collapse 07/11/2015    Ventricular arrhythmia 07/10/2015   Complete heart block (HCC)    ICD (implantable cardioverter-defibrillator) discharge    Atrial fibrillation (HCC) 06/04/2015   Gonadotropin deficiency 06/02/2015   GERD (gastroesophageal reflux disease) 02/18/2015   Abdominal pain, epigastric 02/18/2015   Pleural effusion 09/01/2014   Rectal bleeding 03/25/2014   Acute blood loss anemia 03/25/2014   Adult body mass index 28.0-28.9 09/30/2013   Diabetes mellitus, type 2 (HCC) 08/09/2013   Frequent PVCs 07/04/2013   Hyperlipidemia 01/03/2013   Cardiac resynchronization therapy defibrillator (CRT-D) in place 03/30/2012   Status post mitral valve annuloplasty 01/16/2012   HTN (hypertension) 11/28/2011   Chronic systolic heart failure (HCC) 10/31/2011   Ischemic cardiomyopathy 10/31/2011   CKD (chronic kidney disease) stage 3, GFR 30-59 ml/min (HCC) 10/31/2011   Current use of long term anticoagulation 10/31/2011   Long term (current) use of anticoagulants 10/28/2011   S/P mitral valve repair 10/17/2011   S/P CABG x 3 10/17/2011   Mitral regurgitation 10/12/2011   NSTEMI (non-ST elevated myocardial infarction) (HCC) 10/11/2011    ONSET DATE: chronic   REFERRING DIAG: L89.90 (ICD-10-CM) - Pressure ulcers of skin of multiple topographic sites  THERAPY DIAG:  Left heel pressure wound   Rationale  for Evaluation and Treatment: Habilitation Wayne Gordon is a 89 y.o. male with history of HFrEF s/p CRT-D, hx angioedema with ACE-I, CAD s/p CABG and mitral valve repair in 2013, CKD IV, persistent Afib/flutter, DM II on insulin .       Above is following debridement.  Pt is very tender with palpation.  Pt has multiple aging spots throughout LE B but no wounds.   PATIENT EDUCATION: Education details: Cleanse and moisturize LE's everyday, complete 10 ankle pumps every hour, get and wear a multipodus boot to alleviate pressure off of heel.  Person educated: Patient and Child(ren) Education method:  Explanation and Verbal cues Education comprehension: verbalized understanding   HOME EXERCISE PROGRAM: none   GOALS: Goals reviewed with patient? Yes  SHORT TERM GOALS: Target date: 09/17/24  Pt to be completing cleansing, moisturizing, ankle pumps and wearing multipodus boot. Baseline: Goal status: IN PROGRESS  2.  Lt heel pressure wound to be healed.  Baseline:  Goal status: IN PROGRESS    ASSESSMENT:  CLINICAL IMPRESSION: Patient is a 89 y.o. male who was seen today for physical therapy evaluation and treatment for multiple wounds.  Wayne Gordon states that he has been in and out of the hospital for the past 5 months which has caused wounds on his heals.  He states that he has healed the wound on his Rt heel but continues to have a wound on his Left.  Wayne Gordon has increased edema with a small wound on his left heel.  Therapist debrided this wound which is very painful.  There is still callous area over the wound.  At this time the wound is so small self care is indicated.  Therapist recommended cleansing and moisturizing daily, wearing a multipodus boot at night, and completing ankle pumps hourly.  No skilled PT is indicated at this time.   OBJECTIVE IMPAIRMENTS: cardiopulmonary status limiting activity, decreased activity tolerance, decreased endurance, decreased mobility, difficulty walking, increased edema, impaired sensation, pain, and decreased skin integrity.   ACTIVITY LIMITATIONS: dressing and hygiene/grooming  PERSONAL FACTORS: Age, Fitness, Time since onset of injury/illness/exacerbation, and  comorbidities: CAD, CHF ,DM, CKD are also affecting patient's functional outcome.   REHAB POTENTIAL: Fair    CLINICAL DECISION MAKING: Evolving/moderate complexity  EVALUATION COMPLEXITY: Moderate  PLAN: PT FREQUENCY: 1x/week  PT DURATION: 1 week  PLANNED INTERVENTIONS: 97110-Therapeutic exercises, 97535- Self Care, [id number]- Manual therapy, 97597- Wound care (first 20 sq  cm), and Patient/Family education  PLAN FOR NEXT SESSION: Discharge to self care.    Montie Metro, PT CLT [phone]  09/03/2024, 1:43 PM  [employer] Auth Request Treatment Start Date: 09/03/24  Referring diagnosis code (ICD 10)? L89.90 (ICD-10-CM) - Pressure ulcers of skin of multiple topographic sites Treatment diagnosis codes (ICD 10)? (if different than referring diagnosis) L89.620; [phone] What was this (referring dx) caused by? []  Surgery []  Fall []  Ongoing issue []  Arthritis [x]  Other: __inactivity __________  Laterality: []  Rt [x]  Lt []  Both  Deficits: [x]  Pain []  Stiffness []  Weakness []  Edema []  Balance Deficits []  Coordination []  Gait Disturbance []  ROM [x]  Other decreased skin integrity     CPT codes: See Planned Interventions listed in the Plan section of the Evaluation.        "

## 2024-09-05 ENCOUNTER — Ambulatory Visit (HOSPITAL_COMMUNITY): Admitting: Physical Therapy

## 2024-09-10 ENCOUNTER — Ambulatory Visit (HOSPITAL_COMMUNITY): Admitting: Physical Therapy

## 2024-09-12 ENCOUNTER — Ambulatory Visit (HOSPITAL_COMMUNITY): Admitting: Physical Therapy

## 2024-09-17 ENCOUNTER — Ambulatory Visit (HOSPITAL_COMMUNITY): Admitting: Physical Therapy

## 2024-09-19 ENCOUNTER — Ambulatory Visit (HOSPITAL_COMMUNITY): Admitting: Physical Therapy

## 2024-09-24 ENCOUNTER — Ambulatory Visit (HOSPITAL_COMMUNITY): Admitting: Physical Therapy

## 2024-09-26 ENCOUNTER — Ambulatory Visit (HOSPITAL_COMMUNITY): Admitting: Physical Therapy

## 2024-10-25 ENCOUNTER — Ambulatory Visit: Admitting: Nurse Practitioner

## 2024-11-21 ENCOUNTER — Encounter

## 2025-02-20 ENCOUNTER — Encounter

## 2025-05-22 ENCOUNTER — Encounter

## 2025-08-25 ENCOUNTER — Encounter

## 2025-11-20 ENCOUNTER — Encounter
